# Patient Record
Sex: Female | Born: 1945 | Race: White | Hispanic: No
Health system: Southern US, Academic
[De-identification: ages and names within clinical notes are randomized; demographics above are authoritative.]

## PROBLEM LIST (undated history)

## (undated) ENCOUNTER — Encounter

## (undated) ENCOUNTER — Encounter: Attending: Certified Registered" | Primary: Certified Registered"

## (undated) ENCOUNTER — Ambulatory Visit

## (undated) ENCOUNTER — Telehealth: Attending: Certified Registered" | Primary: Certified Registered"

## (undated) ENCOUNTER — Ambulatory Visit: Payer: MEDICARE

## (undated) ENCOUNTER — Telehealth

## (undated) ENCOUNTER — Encounter: Attending: Gastroenterology | Primary: Gastroenterology

## (undated) ENCOUNTER — Ambulatory Visit
Attending: Student in an Organized Health Care Education/Training Program | Primary: Student in an Organized Health Care Education/Training Program

## (undated) DIAGNOSIS — K219 Gastro-esophageal reflux disease without esophagitis: Secondary | ICD-10-CM

## (undated) DIAGNOSIS — I10 Essential (primary) hypertension: Secondary | ICD-10-CM

## (undated) DIAGNOSIS — T4145XA Adverse effect of unspecified anesthetic, initial encounter: Secondary | ICD-10-CM

## (undated) DIAGNOSIS — N939 Abnormal uterine and vaginal bleeding, unspecified: Secondary | ICD-10-CM

## (undated) DIAGNOSIS — B1920 Unspecified viral hepatitis C without hepatic coma: Secondary | ICD-10-CM

## (undated) DIAGNOSIS — I639 Cerebral infarction, unspecified: Secondary | ICD-10-CM

## (undated) DIAGNOSIS — K4190 Unilateral femoral hernia, without obstruction or gangrene, not specified as recurrent: Secondary | ICD-10-CM

## (undated) DIAGNOSIS — H35389 Toxic maculopathy, unspecified eye: Secondary | ICD-10-CM

## (undated) DIAGNOSIS — N200 Calculus of kidney: Secondary | ICD-10-CM

## (undated) DIAGNOSIS — I6381 Other cerebral infarction due to occlusion or stenosis of small artery: Secondary | ICD-10-CM

## (undated) DIAGNOSIS — G459 Transient cerebral ischemic attack, unspecified: Secondary | ICD-10-CM

## (undated) DIAGNOSIS — C801 Malignant (primary) neoplasm, unspecified: Secondary | ICD-10-CM

## (undated) DIAGNOSIS — Z9289 Personal history of other medical treatment: Secondary | ICD-10-CM

## (undated) DIAGNOSIS — F338 Other recurrent depressive disorders: Secondary | ICD-10-CM

## (undated) DIAGNOSIS — F411 Generalized anxiety disorder: Secondary | ICD-10-CM

## (undated) DIAGNOSIS — S2239XA Fracture of one rib, unspecified side, initial encounter for closed fracture: Secondary | ICD-10-CM

## (undated) DIAGNOSIS — Z87442 Personal history of urinary calculi: Secondary | ICD-10-CM

## (undated) DIAGNOSIS — S82899A Other fracture of unspecified lower leg, initial encounter for closed fracture: Secondary | ICD-10-CM

## (undated) DIAGNOSIS — S3992XA Unspecified injury of lower back, initial encounter: Secondary | ICD-10-CM

## (undated) DIAGNOSIS — F32A Depression, unspecified: Secondary | ICD-10-CM

## (undated) DIAGNOSIS — R109 Unspecified abdominal pain: Secondary | ICD-10-CM

## (undated) DIAGNOSIS — H348192 Central retinal vein occlusion, unspecified eye, stable: Secondary | ICD-10-CM

## (undated) DIAGNOSIS — H269 Unspecified cataract: Secondary | ICD-10-CM

## (undated) DIAGNOSIS — Z8619 Personal history of other infectious and parasitic diseases: Secondary | ICD-10-CM

## (undated) DIAGNOSIS — F329 Major depressive disorder, single episode, unspecified: Secondary | ICD-10-CM

## (undated) DIAGNOSIS — I728 Aneurysm of other specified arteries: Secondary | ICD-10-CM

## (undated) DIAGNOSIS — J45909 Unspecified asthma, uncomplicated: Secondary | ICD-10-CM

## (undated) DIAGNOSIS — T8859XA Other complications of anesthesia, initial encounter: Secondary | ICD-10-CM

## (undated) HISTORY — DX: Unspecified cataract: H26.9

## (undated) HISTORY — DX: Other fracture of unspecified lower leg, initial encounter for closed fracture: S82.899A

## (undated) HISTORY — PX: TONSILLECTOMY: SUR1361

## (undated) HISTORY — PX: LIVER TRANSPLANTATION: SUR1389

## (undated) HISTORY — DX: Calculus of kidney: N20.0

## (undated) HISTORY — DX: Personal history of other infectious and parasitic diseases: Z86.19

## (undated) HISTORY — DX: Transient cerebral ischemic attack, unspecified: G45.9

## (undated) HISTORY — DX: Aneurysm of other specified arteries: I72.8

## (undated) HISTORY — DX: Unspecified abdominal pain: R10.9

## (undated) HISTORY — DX: Unspecified asthma, uncomplicated: J45.909

## (undated) HISTORY — DX: Gastro-esophageal reflux disease without esophagitis: K21.9

## (undated) HISTORY — DX: Generalized anxiety disorder: F41.1

## (undated) HISTORY — DX: Unspecified viral hepatitis C without hepatic coma: B19.20

## (undated) HISTORY — DX: Central retinal vein occlusion, unspecified eye, stable: H34.8192

## (undated) HISTORY — PX: TUBAL LIGATION: SHX77

## (undated) HISTORY — DX: Fracture of one rib, unspecified side, initial encounter for closed fracture: S22.39XA

## (undated) HISTORY — DX: Toxic maculopathy, unspecified eye: H35.389

## (undated) HISTORY — DX: Unspecified injury of lower back, initial encounter: S39.92XA

## (undated) HISTORY — PX: CATARACT EXTRACTION: SUR2

## (undated) HISTORY — DX: Unilateral femoral hernia, without obstruction or gangrene, not specified as recurrent: K41.90

## (undated) HISTORY — DX: Malignant (primary) neoplasm, unspecified: C80.1

## (undated) HISTORY — DX: Abnormal uterine and vaginal bleeding, unspecified: N93.9

## (undated) HISTORY — DX: Other cerebral infarction due to occlusion or stenosis of small artery: I63.81

## (undated) HISTORY — DX: Personal history of urinary calculi: Z87.442

---
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
## 1969-06-15 DIAGNOSIS — Z9289 Personal history of other medical treatment: Secondary | ICD-10-CM

## 1969-06-15 HISTORY — DX: Personal history of other medical treatment: Z92.89

## 1995-06-16 HISTORY — PX: CHOLECYSTECTOMY: SHX55

## 1995-06-16 HISTORY — PX: FEMORAL HERNIA REPAIR: SUR1179

## 1995-06-16 HISTORY — PX: NASAL SINUS SURGERY: SHX719

## 2001-10-11 ENCOUNTER — Ambulatory Visit (HOSPITAL_COMMUNITY): Admission: RE | Admit: 2001-10-11 | Discharge: 2001-10-11 | Payer: Self-pay | Admitting: *Deleted

## 2001-10-11 ENCOUNTER — Encounter: Payer: Self-pay | Admitting: *Deleted

## 2003-07-10 ENCOUNTER — Encounter: Admission: RE | Admit: 2003-07-10 | Discharge: 2003-07-10 | Payer: Self-pay | Admitting: Internal Medicine

## 2004-03-10 ENCOUNTER — Ambulatory Visit (HOSPITAL_COMMUNITY): Admission: RE | Admit: 2004-03-10 | Discharge: 2004-03-10 | Payer: Self-pay | Admitting: *Deleted

## 2004-03-14 ENCOUNTER — Emergency Department (HOSPITAL_COMMUNITY): Admission: EM | Admit: 2004-03-14 | Discharge: 2004-03-14 | Payer: Self-pay | Admitting: Emergency Medicine

## 2004-08-16 ENCOUNTER — Emergency Department (HOSPITAL_COMMUNITY): Admission: EM | Admit: 2004-08-16 | Discharge: 2004-08-16 | Payer: Self-pay | Admitting: Family Medicine

## 2005-07-09 ENCOUNTER — Ambulatory Visit (HOSPITAL_COMMUNITY): Admission: RE | Admit: 2005-07-09 | Discharge: 2005-07-09 | Payer: Self-pay | Admitting: Gastroenterology

## 2005-07-28 ENCOUNTER — Other Ambulatory Visit: Admission: RE | Admit: 2005-07-28 | Discharge: 2005-07-28 | Payer: Self-pay | Admitting: Internal Medicine

## 2005-08-05 ENCOUNTER — Encounter: Admission: RE | Admit: 2005-08-05 | Discharge: 2005-08-05 | Payer: Self-pay | Admitting: Internal Medicine

## 2005-08-21 ENCOUNTER — Ambulatory Visit (HOSPITAL_COMMUNITY): Admission: RE | Admit: 2005-08-21 | Discharge: 2005-08-21 | Payer: Self-pay | Admitting: Internal Medicine

## 2006-01-15 ENCOUNTER — Ambulatory Visit: Payer: Self-pay | Admitting: Gastroenterology

## 2006-03-03 ENCOUNTER — Ambulatory Visit (HOSPITAL_COMMUNITY): Admission: RE | Admit: 2006-03-03 | Discharge: 2006-03-03 | Payer: Self-pay | Admitting: Gastroenterology

## 2006-03-12 ENCOUNTER — Ambulatory Visit: Payer: Self-pay | Admitting: Gastroenterology

## 2006-08-17 ENCOUNTER — Ambulatory Visit (HOSPITAL_COMMUNITY): Admission: RE | Admit: 2006-08-17 | Discharge: 2006-08-17 | Payer: Self-pay | Admitting: Gastroenterology

## 2006-10-12 ENCOUNTER — Ambulatory Visit (HOSPITAL_COMMUNITY): Admission: RE | Admit: 2006-10-12 | Discharge: 2006-10-12 | Payer: Self-pay | Admitting: Gastroenterology

## 2006-10-12 ENCOUNTER — Encounter (INDEPENDENT_AMBULATORY_CARE_PROVIDER_SITE_OTHER): Payer: Self-pay | Admitting: Specialist

## 2006-10-22 ENCOUNTER — Ambulatory Visit: Payer: Self-pay | Admitting: Hematology & Oncology

## 2006-11-03 LAB — COMPREHENSIVE METABOLIC PANEL
Albumin: 4 g/dL (ref 3.5–5.2)
Alkaline Phosphatase: 84 U/L (ref 39–117)
CO2: 25 mEq/L (ref 19–32)
Chloride: 108 mEq/L (ref 96–112)
Glucose, Bld: 90 mg/dL (ref 70–99)
Potassium: 4 mEq/L (ref 3.5–5.3)
Sodium: 141 mEq/L (ref 135–145)
Total Bilirubin: 1.3 mg/dL — ABNORMAL HIGH (ref 0.3–1.2)
Total Protein: 7 g/dL (ref 6.0–8.3)

## 2006-11-03 LAB — CBC WITH DIFFERENTIAL/PLATELET
BASO%: 0.7 % (ref 0.0–2.0)
HCT: 37.5 % (ref 34.8–46.6)
MONO#: 0.4 10*3/uL (ref 0.1–0.9)
RDW: 14.2 % (ref 11.3–14.5)
WBC: 4.8 10*3/uL (ref 3.9–10.0)

## 2006-11-03 LAB — AFP TUMOR MARKER: AFP-Tumor Marker: 22.6 ng/mL — ABNORMAL HIGH (ref 0.0–8.0)

## 2006-11-19 ENCOUNTER — Ambulatory Visit (HOSPITAL_COMMUNITY): Admission: RE | Admit: 2006-11-19 | Discharge: 2006-11-19 | Payer: Self-pay | Admitting: Gastroenterology

## 2006-12-03 ENCOUNTER — Encounter (INDEPENDENT_AMBULATORY_CARE_PROVIDER_SITE_OTHER): Payer: Self-pay | Admitting: Interventional Radiology

## 2006-12-03 ENCOUNTER — Ambulatory Visit (HOSPITAL_COMMUNITY): Admission: RE | Admit: 2006-12-03 | Discharge: 2006-12-03 | Payer: Self-pay | Admitting: Hematology & Oncology

## 2006-12-20 ENCOUNTER — Ambulatory Visit (HOSPITAL_COMMUNITY): Admission: RE | Admit: 2006-12-20 | Discharge: 2006-12-20 | Payer: Self-pay | Admitting: Oncology

## 2007-11-01 ENCOUNTER — Other Ambulatory Visit: Admission: RE | Admit: 2007-11-01 | Discharge: 2007-11-01 | Payer: Self-pay | Admitting: Family Medicine

## 2008-02-17 HISTORY — PX: OTHER SURGICAL HISTORY: SHX169

## 2010-02-17 ENCOUNTER — Emergency Department (HOSPITAL_COMMUNITY): Admission: EM | Admit: 2010-02-17 | Discharge: 2010-02-17 | Payer: Self-pay | Admitting: Emergency Medicine

## 2010-07-16 ENCOUNTER — Other Ambulatory Visit: Payer: Self-pay | Admitting: Dermatology

## 2010-08-28 LAB — URINALYSIS, MICROSCOPIC ONLY
Bilirubin Urine: NEGATIVE
Glucose, UA: NEGATIVE mg/dL
Protein, ur: NEGATIVE mg/dL
pH: 5 (ref 5.0–8.0)

## 2010-08-28 LAB — POCT URINALYSIS DIPSTICK
Bilirubin Urine: NEGATIVE
Glucose, UA: NEGATIVE mg/dL
Ketones, ur: NEGATIVE mg/dL
Nitrite: NEGATIVE
Protein, ur: NEGATIVE mg/dL
Specific Gravity, Urine: 1.025 (ref 1.005–1.030)
Urobilinogen, UA: 0.2 mg/dL (ref 0.0–1.0)

## 2010-08-28 LAB — URINE CULTURE: Culture: NO GROWTH

## 2011-02-09 ENCOUNTER — Other Ambulatory Visit: Payer: Self-pay | Admitting: Dermatology

## 2011-03-26 DIAGNOSIS — H26491 Other secondary cataract, right eye: Secondary | ICD-10-CM

## 2011-03-26 DIAGNOSIS — H34812 Central retinal vein occlusion, left eye, with macular edema: Secondary | ICD-10-CM

## 2011-03-26 HISTORY — DX: Other secondary cataract, right eye: H26.491

## 2011-03-26 HISTORY — DX: Central retinal vein occlusion, left eye, with macular edema: H34.8120

## 2011-04-01 LAB — CBC
Hemoglobin: 13.1
MCHC: 34
RBC: 4.21

## 2011-04-01 LAB — PROTIME-INR: INR: 1.1

## 2011-06-18 DIAGNOSIS — H2510 Age-related nuclear cataract, unspecified eye: Secondary | ICD-10-CM | POA: Diagnosis not present

## 2011-06-18 DIAGNOSIS — H3581 Retinal edema: Secondary | ICD-10-CM | POA: Diagnosis not present

## 2011-06-18 DIAGNOSIS — H348192 Central retinal vein occlusion, unspecified eye, stable: Secondary | ICD-10-CM | POA: Diagnosis not present

## 2011-06-22 DIAGNOSIS — F329 Major depressive disorder, single episode, unspecified: Secondary | ICD-10-CM | POA: Diagnosis not present

## 2011-06-22 DIAGNOSIS — K7689 Other specified diseases of liver: Secondary | ICD-10-CM | POA: Diagnosis not present

## 2011-06-22 DIAGNOSIS — E559 Vitamin D deficiency, unspecified: Secondary | ICD-10-CM | POA: Diagnosis not present

## 2011-06-22 DIAGNOSIS — Z23 Encounter for immunization: Secondary | ICD-10-CM | POA: Diagnosis not present

## 2011-09-03 DIAGNOSIS — H2510 Age-related nuclear cataract, unspecified eye: Secondary | ICD-10-CM | POA: Diagnosis not present

## 2011-09-03 DIAGNOSIS — H3581 Retinal edema: Secondary | ICD-10-CM | POA: Diagnosis not present

## 2011-09-03 DIAGNOSIS — H348192 Central retinal vein occlusion, unspecified eye, stable: Secondary | ICD-10-CM | POA: Diagnosis not present

## 2011-09-24 DIAGNOSIS — Z79899 Other long term (current) drug therapy: Secondary | ICD-10-CM | POA: Diagnosis not present

## 2011-09-24 DIAGNOSIS — Z944 Liver transplant status: Secondary | ICD-10-CM | POA: Diagnosis not present

## 2011-11-05 DIAGNOSIS — H2510 Age-related nuclear cataract, unspecified eye: Secondary | ICD-10-CM | POA: Diagnosis not present

## 2011-11-05 DIAGNOSIS — H3581 Retinal edema: Secondary | ICD-10-CM | POA: Diagnosis not present

## 2011-11-05 DIAGNOSIS — H348192 Central retinal vein occlusion, unspecified eye, stable: Secondary | ICD-10-CM | POA: Diagnosis not present

## 2011-12-14 DIAGNOSIS — J069 Acute upper respiratory infection, unspecified: Secondary | ICD-10-CM | POA: Diagnosis not present

## 2012-01-13 DIAGNOSIS — Z1211 Encounter for screening for malignant neoplasm of colon: Secondary | ICD-10-CM | POA: Diagnosis not present

## 2012-01-13 DIAGNOSIS — F329 Major depressive disorder, single episode, unspecified: Secondary | ICD-10-CM | POA: Diagnosis not present

## 2012-01-14 DIAGNOSIS — H2510 Age-related nuclear cataract, unspecified eye: Secondary | ICD-10-CM | POA: Diagnosis not present

## 2012-01-14 DIAGNOSIS — H348192 Central retinal vein occlusion, unspecified eye, stable: Secondary | ICD-10-CM | POA: Diagnosis not present

## 2012-01-14 DIAGNOSIS — H3581 Retinal edema: Secondary | ICD-10-CM | POA: Diagnosis not present

## 2012-01-26 DIAGNOSIS — N200 Calculus of kidney: Secondary | ICD-10-CM | POA: Diagnosis not present

## 2012-03-04 DIAGNOSIS — H2510 Age-related nuclear cataract, unspecified eye: Secondary | ICD-10-CM | POA: Diagnosis not present

## 2012-03-10 DIAGNOSIS — F329 Major depressive disorder, single episode, unspecified: Secondary | ICD-10-CM | POA: Diagnosis not present

## 2012-03-10 DIAGNOSIS — C228 Malignant neoplasm of liver, primary, unspecified as to type: Secondary | ICD-10-CM | POA: Diagnosis not present

## 2012-03-10 DIAGNOSIS — K7460 Unspecified cirrhosis of liver: Secondary | ICD-10-CM | POA: Diagnosis not present

## 2012-03-10 DIAGNOSIS — Z0389 Encounter for observation for other suspected diseases and conditions ruled out: Secondary | ICD-10-CM | POA: Diagnosis not present

## 2012-03-10 DIAGNOSIS — R945 Abnormal results of liver function studies: Secondary | ICD-10-CM | POA: Diagnosis not present

## 2012-03-10 DIAGNOSIS — Z944 Liver transplant status: Secondary | ICD-10-CM | POA: Diagnosis not present

## 2012-03-10 DIAGNOSIS — B182 Chronic viral hepatitis C: Secondary | ICD-10-CM | POA: Diagnosis not present

## 2012-03-10 DIAGNOSIS — Q441 Other congenital malformations of gallbladder: Secondary | ICD-10-CM | POA: Diagnosis not present

## 2012-03-10 DIAGNOSIS — B1920 Unspecified viral hepatitis C without hepatic coma: Secondary | ICD-10-CM | POA: Diagnosis not present

## 2012-03-10 DIAGNOSIS — I10 Essential (primary) hypertension: Secondary | ICD-10-CM | POA: Diagnosis not present

## 2012-03-10 DIAGNOSIS — Z79899 Other long term (current) drug therapy: Secondary | ICD-10-CM | POA: Diagnosis not present

## 2012-03-31 DIAGNOSIS — H3581 Retinal edema: Secondary | ICD-10-CM | POA: Diagnosis not present

## 2012-03-31 DIAGNOSIS — H348192 Central retinal vein occlusion, unspecified eye, stable: Secondary | ICD-10-CM | POA: Diagnosis not present

## 2012-03-31 DIAGNOSIS — H2510 Age-related nuclear cataract, unspecified eye: Secondary | ICD-10-CM | POA: Diagnosis not present

## 2012-05-04 DIAGNOSIS — Z23 Encounter for immunization: Secondary | ICD-10-CM | POA: Diagnosis not present

## 2012-05-04 DIAGNOSIS — F329 Major depressive disorder, single episode, unspecified: Secondary | ICD-10-CM | POA: Diagnosis not present

## 2012-05-16 DIAGNOSIS — H2510 Age-related nuclear cataract, unspecified eye: Secondary | ICD-10-CM | POA: Diagnosis not present

## 2012-05-16 DIAGNOSIS — H269 Unspecified cataract: Secondary | ICD-10-CM | POA: Diagnosis not present

## 2012-05-17 DIAGNOSIS — H2510 Age-related nuclear cataract, unspecified eye: Secondary | ICD-10-CM | POA: Diagnosis not present

## 2012-05-24 DIAGNOSIS — H2510 Age-related nuclear cataract, unspecified eye: Secondary | ICD-10-CM | POA: Diagnosis not present

## 2012-06-27 DIAGNOSIS — H269 Unspecified cataract: Secondary | ICD-10-CM | POA: Diagnosis not present

## 2012-06-27 DIAGNOSIS — H2510 Age-related nuclear cataract, unspecified eye: Secondary | ICD-10-CM | POA: Diagnosis not present

## 2012-07-20 DIAGNOSIS — R42 Dizziness and giddiness: Secondary | ICD-10-CM | POA: Diagnosis not present

## 2012-08-18 DIAGNOSIS — Z961 Presence of intraocular lens: Secondary | ICD-10-CM | POA: Diagnosis not present

## 2012-08-18 DIAGNOSIS — B182 Chronic viral hepatitis C: Secondary | ICD-10-CM | POA: Diagnosis not present

## 2012-08-18 DIAGNOSIS — H348192 Central retinal vein occlusion, unspecified eye, stable: Secondary | ICD-10-CM | POA: Diagnosis not present

## 2012-08-18 DIAGNOSIS — Z944 Liver transplant status: Secondary | ICD-10-CM | POA: Diagnosis not present

## 2012-08-18 DIAGNOSIS — Z79899 Other long term (current) drug therapy: Secondary | ICD-10-CM | POA: Diagnosis not present

## 2012-08-18 DIAGNOSIS — Z01818 Encounter for other preprocedural examination: Secondary | ICD-10-CM | POA: Diagnosis not present

## 2012-08-18 DIAGNOSIS — H3581 Retinal edema: Secondary | ICD-10-CM | POA: Diagnosis not present

## 2012-08-24 DIAGNOSIS — I10 Essential (primary) hypertension: Secondary | ICD-10-CM | POA: Diagnosis not present

## 2012-08-24 DIAGNOSIS — B1920 Unspecified viral hepatitis C without hepatic coma: Secondary | ICD-10-CM | POA: Diagnosis not present

## 2012-08-24 DIAGNOSIS — F329 Major depressive disorder, single episode, unspecified: Secondary | ICD-10-CM | POA: Diagnosis not present

## 2012-08-24 DIAGNOSIS — T8640 Unspecified complication of liver transplant: Secondary | ICD-10-CM | POA: Diagnosis not present

## 2012-08-24 DIAGNOSIS — R748 Abnormal levels of other serum enzymes: Secondary | ICD-10-CM | POA: Diagnosis not present

## 2012-08-24 DIAGNOSIS — Z79899 Other long term (current) drug therapy: Secondary | ICD-10-CM | POA: Diagnosis not present

## 2012-08-26 DIAGNOSIS — F331 Major depressive disorder, recurrent, moderate: Secondary | ICD-10-CM | POA: Diagnosis not present

## 2012-09-05 DIAGNOSIS — F331 Major depressive disorder, recurrent, moderate: Secondary | ICD-10-CM | POA: Diagnosis not present

## 2012-09-06 DIAGNOSIS — H20029 Recurrent acute iridocyclitis, unspecified eye: Secondary | ICD-10-CM | POA: Diagnosis not present

## 2012-09-13 DIAGNOSIS — F331 Major depressive disorder, recurrent, moderate: Secondary | ICD-10-CM | POA: Diagnosis not present

## 2012-09-22 DIAGNOSIS — H348192 Central retinal vein occlusion, unspecified eye, stable: Secondary | ICD-10-CM | POA: Diagnosis not present

## 2012-10-04 DIAGNOSIS — F331 Major depressive disorder, recurrent, moderate: Secondary | ICD-10-CM | POA: Diagnosis not present

## 2012-10-20 DIAGNOSIS — Z23 Encounter for immunization: Secondary | ICD-10-CM | POA: Diagnosis not present

## 2012-10-20 DIAGNOSIS — F329 Major depressive disorder, single episode, unspecified: Secondary | ICD-10-CM | POA: Diagnosis not present

## 2012-10-26 DIAGNOSIS — F331 Major depressive disorder, recurrent, moderate: Secondary | ICD-10-CM | POA: Diagnosis not present

## 2012-10-27 DIAGNOSIS — Z1231 Encounter for screening mammogram for malignant neoplasm of breast: Secondary | ICD-10-CM | POA: Diagnosis not present

## 2012-11-02 DIAGNOSIS — Z944 Liver transplant status: Secondary | ICD-10-CM | POA: Diagnosis not present

## 2012-11-02 DIAGNOSIS — Z79899 Other long term (current) drug therapy: Secondary | ICD-10-CM | POA: Diagnosis not present

## 2012-11-03 DIAGNOSIS — H348192 Central retinal vein occlusion, unspecified eye, stable: Secondary | ICD-10-CM | POA: Diagnosis not present

## 2012-11-03 DIAGNOSIS — H3581 Retinal edema: Secondary | ICD-10-CM | POA: Diagnosis not present

## 2012-11-03 DIAGNOSIS — Z961 Presence of intraocular lens: Secondary | ICD-10-CM | POA: Diagnosis not present

## 2012-11-11 ENCOUNTER — Encounter: Payer: Self-pay | Admitting: Obstetrics & Gynecology

## 2012-11-11 ENCOUNTER — Ambulatory Visit (INDEPENDENT_AMBULATORY_CARE_PROVIDER_SITE_OTHER): Payer: Medicare Other | Admitting: Obstetrics & Gynecology

## 2012-11-11 VITALS — BP 116/64 | Ht 62.5 in | Wt 162.2 lb

## 2012-11-11 DIAGNOSIS — Z124 Encounter for screening for malignant neoplasm of cervix: Secondary | ICD-10-CM

## 2012-11-11 DIAGNOSIS — F329 Major depressive disorder, single episode, unspecified: Secondary | ICD-10-CM

## 2012-11-11 DIAGNOSIS — Z944 Liver transplant status: Secondary | ICD-10-CM

## 2012-11-11 DIAGNOSIS — N881 Old laceration of cervix uteri: Secondary | ICD-10-CM

## 2012-11-11 DIAGNOSIS — C229 Malignant neoplasm of liver, not specified as primary or secondary: Secondary | ICD-10-CM

## 2012-11-11 DIAGNOSIS — K739 Chronic hepatitis, unspecified: Secondary | ICD-10-CM

## 2012-11-11 DIAGNOSIS — K7689 Other specified diseases of liver: Secondary | ICD-10-CM

## 2012-11-11 DIAGNOSIS — H34819 Central retinal vein occlusion, unspecified eye: Secondary | ICD-10-CM

## 2012-11-11 DIAGNOSIS — R195 Other fecal abnormalities: Secondary | ICD-10-CM

## 2012-11-11 DIAGNOSIS — K5900 Constipation, unspecified: Secondary | ICD-10-CM

## 2012-11-11 DIAGNOSIS — H348192 Central retinal vein occlusion, unspecified eye, stable: Secondary | ICD-10-CM

## 2012-11-11 DIAGNOSIS — J309 Allergic rhinitis, unspecified: Secondary | ICD-10-CM

## 2012-11-11 DIAGNOSIS — N950 Postmenopausal bleeding: Secondary | ICD-10-CM

## 2012-11-11 NOTE — Patient Instructions (Addendum)
You will return for a pelvic ultrasound to evaluate the lining of the uterus.

## 2012-11-13 NOTE — Progress Notes (Signed)
67 y.o. [name] DivorcedCaucasianF here for new patient appointment.  She is referred by Dr. Shaune Hill for vaginal bleeding the patient desribes as very heavy with sudden onset on 10/20/12.  The bleeding lasted for two days and then stopped as quickly as it started.   She needed to wear a pad for the bleeding as it was heavy enough for this.  She has no preceeding symptoms--no breast tenderness or cramping.  She denies pelvic pain now.  She had no clots.  She is fairly certain it was vaginal.  She denies vaginal discharge.  She was not doing anything in particular when the bleeding started--no heavy lifting, no intercourse.  She has had a liver transplant due to Hepatitis C.  She reports having bleeding issues before the transplant was performed but she has no issues now with clotting.  She does not bleed excessively and is on no blood thinners.  No recent fevers.  No back or pelvic pain.  No recent urinary symptoms.  She reports constipation for several days before bleeding.  She did have a hard bowel movement the same day the bleeding started.  A concern of hers, unrelated to the bleeding, is skin itching which results in areas of excoriation that are then slow to heal.  She has patches of this on her arms, legs, and torso.  I feel this is most likely to the medications she is on and the delayed healing is probably due to her immunosuppressants.  Review of chart shows a skin biopsy in 2012 with results of lichen simplex chronicus.  Reports she hasn't seen a gynecologist in years and is unsure of her last Pap smear but she feels it has been years.  She has had a recent MMG but no recent breast exam.    No LMP recorded. Patient is postmenopausal.           Health Maintenance: Pap:  Pt unsure History of abnormal Pap:  no MMG:  Pt reports 2013 and neg   reports that she has never smoked. She has never used smokeless tobacco. She reports that she does not drink alcohol or use illicit drugs.  Past Medical  History  Diagnosis Date  . History of renal stone   . Hepatitis C   . Hernia, femoral     left  . Acid reflux   . Hiatal hernia   . 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 Broken rib     x2  . Broken ankle     right  . Abnormal uterine bleeding   . Blood transfusion without reported diagnosis     with first pregnancy  . History of shingles   . Asthma   . Cancer     liver (in the one that was removed)  . Tailbone injury     broken x2    Past Surgical History  Procedure Laterality Date  . Cholecystectomy    . Tranplant  02/17/08    liver  . Cataract extraction      bilateral  . Tubal ligation    . Nasal sinus surgery      x3  . Tonsillectomy  age 64  . Femoral hernia repair      left leg    Current Outpatient Prescriptions  Medication Sig Dispense Refill  . ALPHAGAN P 0.1 % SOLN Two drops daily      . folic acid (FOLVITE) 400 MCG  tablet Take 400 mcg by mouth daily.      . metoprolol tartrate (LOPRESSOR) 25 MG tablet 25 mg daily.      . NON FORMULARY avastin injection for retina damage      . potassium citrate (UROCIT-K) 10 MEQ (1080 MG) SR tablet       . pyridOXINE (VITAMIN B-6) 100 MG tablet Take 100 mg by mouth daily.      . tacrolimus (PROGRAF) 1 MG capsule Take 1 mg by mouth 2 (two) times daily.      Marland Kitchen venlafaxine XR (EFFEXOR-XR) 150 MG 24 hr capsule Take 150 mg by mouth. Take 150mg  twice daily, along with 75mg  twice daily      . VITAMIN D, CHOLECALCIFEROL, PO Take 25 mcg by mouth daily.       No current facility-administered medications for this visit.    Family History  Problem Relation Age of Onset  . Adopted: Yes  . Stroke Mother   . Alcoholism Father   . Alzheimer's disease Mother     ROS:  Pertinent items are noted in HPI.  Otherwise, a comprehensive ROS was negative.  Exam:   There were no vitals taken for this visit.       General appearance: alert, cooperative and appears stated  age Head: Normocephalic, without obvious abnormality, atraumatic Neck: no adenopathy, supple, symmetrical, trachea midline and thyroid normal to inspection and palpation Breasts: normal appearance, no masses or tenderness Abdomen: soft, non-tender; bowel sounds normal; no masses,  no organomegaly, large and well healed scar upper abdomen Extremities: extremities normal, atraumatic, no cyanosis or edema Skin: Skin color, texture, turgor normal. No rashes or lesions Lymph nodes: Cervical, supraclavicular, and axillary nodes normal. No abnormal inguinal nodes palpated Neurologic: Grossly normal   Pelvic: External genitalia:  no lesions              Urethra:  normal appearing urethra with no masses, tenderness or lesions              Bartholins and Skenes: normal                 Vagina: normal appearing vagina with normal color and discharge, no lesions, NO evidence of blood--new or old              Cervix: no lesions and stenotic              Pap taken: yes Bimanual Exam:  Uterus:  normal size, contour, position, consistency, mobility, non-tender              Adnexa: normal adnexa and no mass, fullness, tenderness               Rectovaginal: Confirms               Anus:  normal sphincter tone, internal hemorrhoids, heme +  Endometrial biopsy recommended.  Discussed with patient.  Verbal and written consent obtained.   Procedure:  Speculum placed.  Cervix visualized and cleansed with betadine prep.  A single toothed tenaculum was applied to the anterior lip of the cervix.  Very stenotic os noted.  Attempt make to dilate cervix with milex dilator.  Multiple attempts made.  Procedure aborted due to pt discomfort and lack of ability to dilate the cervix.     A:  Patient reported postmenopausal bleeding but no evidence on physical exam to suggest recent bleeding Heme + stool H/o liver transplant on immunosuppression  P:   Lengthy discussion with patient  and daughter regarding physical exam  findings.  I really feel she had rectal bleeding and it probably going to need a repeat colonoscopy.  I am going to perform a pelvic ultrasound to evaluate the endometrium and ensure I do not need to proceed with endometrial biopsy.  If the endometrium on ultrasound is <65mm, no biopsy will be recommended.  If >17mm, will use cytotec to help with the stenosis and a repeat in-office attempt at endometrial biopsy will be performed.  Other than hemmorrhoids, gyn exam was completely normal.  Patient was reassured by this.  Pap pending.  ~70 minutes spent with patient >50% of time was in face to face discussion of above.

## 2012-11-14 ENCOUNTER — Telehealth: Payer: Self-pay | Admitting: Obstetrics & Gynecology

## 2012-11-14 ENCOUNTER — Encounter: Payer: Self-pay | Admitting: Obstetrics & Gynecology

## 2012-11-14 DIAGNOSIS — J309 Allergic rhinitis, unspecified: Secondary | ICD-10-CM | POA: Insufficient documentation

## 2012-11-14 DIAGNOSIS — Z944 Liver transplant status: Secondary | ICD-10-CM | POA: Insufficient documentation

## 2012-11-14 DIAGNOSIS — H348192 Central retinal vein occlusion, unspecified eye, stable: Secondary | ICD-10-CM | POA: Insufficient documentation

## 2012-11-14 DIAGNOSIS — K739 Chronic hepatitis, unspecified: Secondary | ICD-10-CM | POA: Insufficient documentation

## 2012-11-14 DIAGNOSIS — K7689 Other specified diseases of liver: Secondary | ICD-10-CM | POA: Insufficient documentation

## 2012-11-14 DIAGNOSIS — F329 Major depressive disorder, single episode, unspecified: Secondary | ICD-10-CM | POA: Insufficient documentation

## 2012-11-14 DIAGNOSIS — F338 Other recurrent depressive disorders: Secondary | ICD-10-CM | POA: Insufficient documentation

## 2012-11-14 DIAGNOSIS — C229 Malignant neoplasm of liver, not specified as primary or secondary: Secondary | ICD-10-CM | POA: Insufficient documentation

## 2012-11-14 HISTORY — DX: Allergic rhinitis, unspecified: J30.9

## 2012-11-14 HISTORY — DX: Malignant neoplasm of liver, not specified as primary or secondary: C22.9

## 2012-11-14 HISTORY — DX: Liver transplant status: Z94.4

## 2012-11-14 HISTORY — DX: Major depressive disorder, single episode, unspecified: F32.9

## 2012-11-14 NOTE — Telephone Encounter (Signed)
Needs to give info to a nurse before appt 11/16/2012

## 2012-11-14 NOTE — Telephone Encounter (Signed)
Spoke with pt about OV last Friday with SM. Pt says she could not give a urine sample at the start of the visit, and was going to try at the end, but forgot. Pt has noticed lately that her urine is darker. Pt urinated into a clear cup at home over the weekend and states her urine was very brown, "like tea you can't see through, or fresh applejuice." Pt has hx of kidney stones, but is on a medication for it and has not had trouble except for this dark urine. Pt concerned and wondering if there is something she needs to do before [place] appt on Wed. Please advise.

## 2012-11-14 NOTE — Telephone Encounter (Signed)
No.  I will get a urine sample on Wednesday when she comes.

## 2012-11-15 ENCOUNTER — Telehealth: Payer: Self-pay

## 2012-11-15 DIAGNOSIS — N390 Urinary tract infection, site not specified: Secondary | ICD-10-CM | POA: Diagnosis not present

## 2012-11-15 DIAGNOSIS — N201 Calculus of ureter: Secondary | ICD-10-CM | POA: Diagnosis not present

## 2012-11-15 DIAGNOSIS — R310 Gross hematuria: Secondary | ICD-10-CM | POA: Diagnosis not present

## 2012-11-15 DIAGNOSIS — N281 Cyst of kidney, acquired: Secondary | ICD-10-CM | POA: Diagnosis not present

## 2012-11-15 NOTE — Telephone Encounter (Signed)
6/3 lmtcb/kn 

## 2012-11-15 NOTE — Telephone Encounter (Signed)
LM on pt's VM confirming name that Dr. Hyacinth Meeker would collect a urine sample tomorrow when she has her appt. Pt to call back with any other problems.

## 2012-11-15 NOTE — Telephone Encounter (Signed)
Message copied by Elisha Headland on Tue Nov 15, 2012  2:43 PM ------      Message from: Jerene Bears      Created: Tue Nov 15, 2012 12:59 PM       02.  Inform nl. ------

## 2012-11-16 ENCOUNTER — Ambulatory Visit (INDEPENDENT_AMBULATORY_CARE_PROVIDER_SITE_OTHER): Payer: Medicare Other | Admitting: Obstetrics & Gynecology

## 2012-11-16 ENCOUNTER — Ambulatory Visit (INDEPENDENT_AMBULATORY_CARE_PROVIDER_SITE_OTHER): Payer: Medicare Other

## 2012-11-16 ENCOUNTER — Other Ambulatory Visit: Payer: Self-pay | Admitting: *Deleted

## 2012-11-16 DIAGNOSIS — N950 Postmenopausal bleeding: Secondary | ICD-10-CM

## 2012-11-16 NOTE — Progress Notes (Signed)
67 y.o.Divorcedfemale here for a pelvic ultrasound.  She experienced an episode of what she believed was vaginal bleeding but on physical exam there was no blood in the vaginal vault.  She was heme+ on rectal exam.  She could not give me a urine sample that day.  She subsequently has seen urology and she had blood in her urine.  She is here for pelvic ultrasound as I could not obtain an endometrial biopsy day of visit due to stenotic cervical os.    No LMP recorded. Patient is postmenopausal.  FINDINGS: (report below) UTERUS: 4.6 x 2.5 x 2.4cm without fibroids EMS: 1.27mm, symmetric ADNEXA:   Left ovary atrophic measuring 2.3 x 0.8 x 1.1cm   Right ovary atrophic measuring 2.3 x 0.8 x 0.8cm CUL DE SAC: neg for free fluid  Reviewed results with patient.  Assessment:  Normal postmenopausal female gyn ultrasound, heme +stool, hematuria Plan: Patient will f/u with Dr. Loreta Ave for colonoscopy and urology for cystoscopy and probable CT.  ~15 minutes spent with patient >50% of time was in face to face discussion of above.

## 2012-11-17 ENCOUNTER — Encounter: Payer: Self-pay | Admitting: Obstetrics & Gynecology

## 2012-11-17 DIAGNOSIS — R310 Gross hematuria: Secondary | ICD-10-CM | POA: Diagnosis not present

## 2012-11-17 DIAGNOSIS — N200 Calculus of kidney: Secondary | ICD-10-CM | POA: Diagnosis not present

## 2012-11-17 NOTE — Patient Instructions (Signed)
Please call with any future issues.

## 2012-11-18 ENCOUNTER — Telehealth: Payer: Self-pay | Admitting: *Deleted

## 2012-11-18 NOTE — Telephone Encounter (Signed)
Patient calling for results of blood count from most recent. States she went to urology appt and they are asking for test results and patient is very insistent that we drew blood when she was here.  Advised that perhaps, CMA had blood drawn and held until MD could review but no tests are pending at this time. (Patients account of blood draw doesn't match the environment to our office or staff, no one with red short hair here.) Advised will check with MD but do not see any pending results.

## 2012-11-18 NOTE — Telephone Encounter (Signed)
Please call, pt has a question regarding her hgb result

## 2012-11-21 ENCOUNTER — Encounter (HOSPITAL_COMMUNITY): Payer: Self-pay | Admitting: Pharmacy Technician

## 2012-11-21 ENCOUNTER — Other Ambulatory Visit: Payer: Self-pay | Admitting: Urology

## 2012-11-21 NOTE — Telephone Encounter (Signed)
Since she has some memory issues, would it be best for me to leave it as I left it with her since indeed, there are no outstanding labs. I hate to cause her more confusion but am happy to call her if you prefer.

## 2012-11-21 NOTE — Telephone Encounter (Signed)
That is fine. Thanks 

## 2012-11-21 NOTE — Telephone Encounter (Signed)
Patient has hx of liver transplant and does not have the best memory.  No blood work was done while she was here either for new patient visit or ultrasound.  I did test her stool for blood, which was positive.  She could not give a urine sample when she was here the first time and she went to see her urologist between that visit and the ultrasound.  Also, I attempted an endometrial biopsy without success.  Endometrium was fine on ultrasound.  No additional tests needed.  I really feel like her bleeding is from the rectum, possibly urinary.

## 2012-11-22 ENCOUNTER — Encounter (HOSPITAL_COMMUNITY): Payer: Self-pay | Admitting: *Deleted

## 2012-11-22 NOTE — Progress Notes (Signed)
Reviewed history. Instructed to read every page in blue folder fill out history form for [facility] and pay close attention to restricted med. List. Hold all vitamins, herbs, supplements, asa or ibuprofen any med that is on list. Laxative as directed day prior and plenty of fluids and light dinner. Clear liquids then NPO past 5:30am. Secretary/administrator, insurance card photo ID to Short stay ;with a driver and must have someone stay with you post.

## 2012-11-22 NOTE — H&P (Signed)
History of Present Illness        Ms. Meinhart has PMH of Hep C from blood transfusion after the birth of her first born in [birth year].  In 1991 she was dx w/ Hep C ,  liver cancer in March 2009, and received liver tranplant in Sept 2009.  She is doing well medically post transplant.  She is a patient of Dr. Vernie Ammons with GU hx of nephrolithiasis.             -  A CT scan done in 9/11 revealed a 2.5 mm stone in the lower pole of her right kidney as well as a right renal cyst. On the left-hand side a 6.7 mm stone was identified in the lower pole with Hounsfield units of 650 consistent with uric acid.      Stone analysis: 90% uric acid and 10% tricalcium phosphate.      Stone risk analysis: Her serum studies showed slightly elevated uric acid while her 24-hour urine revealed her primary risk factor was a low 24-hour urine volume of 1.66 L with mild hypocitraturia and an elevated urinary pH.      Treatment: Potassium citrate 10 mEq 2 pills b.i.d.       No interventions needed during most recent f/u visit in 2013 since stones in both kidneys were stable on KUB.  She was scheduled for f/u in 1 year.  Interval history:  Ms. Koch presents today with complaints of one day of gross hematuria w/o clots associated with 9 out of 10 sharp suprapubic pain that woke her up from sleep in the morning.  She describes the feeling of pain as a heavy menstrual cramp.   Hematuria occurred only once 9 days ago and was noted with heavy bleeding thorough the day and slowly resolved in the evening.  She described the hematuria as bright red blood w/o clots.  Urinary incontinence and intermittent dysuria also associated with the hematuria.  Incontinence was very significant on the day of the hematuria that she had to changethe incontinence pad every 2 hours.  She felt that she emptied her bladder well during the episode of hemturia.  Denies urinary frequency/urgency/fever/chills/nause/vomitting.    She noted intermittent color of  urine was dark brown 2 months ago and this is new for her. This brown color is still present today in her urine along w/ some sedimentations noted.   Since she was uncertain as to source of hematuria, she was seen by the gynecologist last week and was told that pelvis exam was normal and bleeding is not from the gyne source but patient is scheduled to return tomorrow for pelvis ultrasound.   Several days prior to hematuria episode, patient had a rectal exam and was told that she has both internal and external hemorroids so she had also contributed blood in urine as coming from the hemorroids.   She denies hx of cigarette smoking but lived with a heavy smoker for 16 yrs.  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  This was the first time she has ever seen blood in urine and has not passed any stones.  She has been compliant with taking UroCit K daily.    Today she is asymptomatic except for feeling really tired.  Denies any other associated symptoms.  She has not tried to take any pain meds for the pain.  Denies any aggravating or alleviating factors. She has been regularly drinking 64 oz of water a day and compliant with taking Uroci  K prescribed for stone prevention.   Past Medical History Problems  1. History of  Asthma 493.90 2. History of  Chronic Hepatitis, C Virus 070.54 3. History of  Cirrhosis 571.5 4. History of  Depression 311 5. History of  Glaucoma 365.9 6. History of  Hepatocellular Carcinoma Of The Liver 155.0  Surgical History Problems  1. History of  Gallbladder Surgery 2. History of  Hernia Repair 3. History of  Liver Transplant 4. History of  Sinus Surgery  Current Meds 1. Avastin SOLN; Therapy: (Recorded:03Jun2014) to 2. Effexor XR 150 MG Oral Capsule Extended Release 24 Hour; Therapy: 16Jun2011 to 3. Ferrous Sulfate TABS; Therapy: (Recorded:13Aug2013) to 4. Fish Oil CAPS; Therapy: (Recorded:13Aug2013) to 5. Metoprolol Tartrate 25 MG Oral Tablet; Therapy: 30Nov2010  to 6. Omeprazole 20 MG Oral Tablet Delayed Release; Therapy: (Recorded:13Aug2013) to 7. Potassium Citrate ER 10 MEQ (1080 MG) Oral Tablet Extended Release; TAKE 1 TABLET BY  MOUTH EVERY MORNING AND TAKE 2 TABLETS BY MOUTH EVERYEVENING; Therapy:  14Oct2011 to (Evaluate:26Jul2014)  Requested for: 27Jan2014; Last Rx:27Jan2014 8. Prograf CAPS; Therapy: (Recorded:13Aug2013) to  Allergies Medication  1. Phenergan TABS 2. Augmentin TABS 3. Codeine Derivatives 4. Lidocaine HCl (PF) SOLN 5. Penicillins 6. Sulfa Drugs  Family History Problems  1. Family history of  No Significant Family History  Social History Problems  1. Caffeine Use one every 3-4 days 2. Marital History - Divorced V61.03 3. Never A Smoker 4. Occupation: Retired Nurse, children's  5. History of  Alcohol Use 6. History of  Tobacco Use  Review of Systems Genitourinary system(s) were reviewed and pertinent findings if present are noted.  Genitourinary: dysuria, nocturia, incontinence, hematuria and suprapubic pain, but no urinary frequency, no urinary urgency, urinary stream does not start and stop, no incomplete emptying of bladder, no urethral discharge and no perineal pain.  Gastrointestinal: abdominal pain, but no nausea, no vomiting and no flank pain.    Vitals Vital Signs [Data Includes: Last 1 Day]  03Jun2014 09:28AM  Blood Pressure: 125 / 77 Temperature: 97.8 F Heart Rate: 68  Physical Exam Constitutional: Well nourished and well developed. No acute distress.  ENT:. The ears and nose are normal in appearance.  Neck: The appearance of the neck is normal and no neck mass is present.  Pulmonary: No respiratory distress and normal respiratory rhythm and effort.  Cardiovascular: Heart rate and rhythm are normal. No peripheral edema.  Abdomen: The abdomen is soft and nontender. No masses are palpated. No CVA tenderness. No hernias are palpable. No hepatosplenomegaly noted.   . Genitourinary:   Ms. Gorsline on  examination was not toxic. She had no CVA tenderness. She had no abdominal tenderness.   Lymphatics: The femoral and inguinal nodes are not enlarged or tender.  Skin: Normal skin turgor, no visible rash and no visible skin lesions.  Neuro/Psych:. Mood and affect are appropriate.   Imp.: CT finding of 8x9x5 mm left UPJ stone w/ mild hydronephrosis.  Except for intermittent mild bladder cramping, patient is asymptomatic, does not even need pain med.  She will return for KUB for possible ESWL and urine cytology.  Tentatively scheduled for ESWL next thursday.

## 2012-11-24 ENCOUNTER — Encounter (HOSPITAL_COMMUNITY)
Admission: RE | Disposition: A | Discharge status: Home / Self Care | Payer: Self-pay | Source: Ambulatory Visit | Attending: Urology

## 2012-11-24 ENCOUNTER — Encounter (HOSPITAL_COMMUNITY): Payer: Self-pay | Admitting: General Practice

## 2012-11-24 ENCOUNTER — Ambulatory Visit (HOSPITAL_COMMUNITY)
Admission: RE | Admit: 2012-11-24 | Discharge: 2012-11-24 | Disposition: A | Discharge status: Home / Self Care | Payer: Medicare Other | Source: Ambulatory Visit | Attending: Urology | Admitting: Urology

## 2012-11-24 ENCOUNTER — Ambulatory Visit (HOSPITAL_COMMUNITY): Payer: Medicare Other

## 2012-11-24 DIAGNOSIS — J45909 Unspecified asthma, uncomplicated: Secondary | ICD-10-CM | POA: Diagnosis not present

## 2012-11-24 DIAGNOSIS — I10 Essential (primary) hypertension: Secondary | ICD-10-CM | POA: Insufficient documentation

## 2012-11-24 DIAGNOSIS — C228 Malignant neoplasm of liver, primary, unspecified as to type: Secondary | ICD-10-CM | POA: Insufficient documentation

## 2012-11-24 DIAGNOSIS — K7460 Unspecified cirrhosis of liver: Secondary | ICD-10-CM | POA: Diagnosis not present

## 2012-11-24 DIAGNOSIS — B182 Chronic viral hepatitis C: Secondary | ICD-10-CM | POA: Diagnosis not present

## 2012-11-24 DIAGNOSIS — N201 Calculus of ureter: Secondary | ICD-10-CM

## 2012-11-24 DIAGNOSIS — N200 Calculus of kidney: Secondary | ICD-10-CM | POA: Insufficient documentation

## 2012-11-24 DIAGNOSIS — Z79899 Other long term (current) drug therapy: Secondary | ICD-10-CM | POA: Insufficient documentation

## 2012-11-24 DIAGNOSIS — Z01818 Encounter for other preprocedural examination: Secondary | ICD-10-CM | POA: Diagnosis not present

## 2012-11-24 HISTORY — DX: Major depressive disorder, single episode, unspecified: F32.9

## 2012-11-24 HISTORY — DX: Calculus of kidney: N20.0

## 2012-11-24 HISTORY — DX: Essential (primary) hypertension: I10

## 2012-11-24 HISTORY — DX: Depression, unspecified: F32.A

## 2012-11-24 SURGERY — LITHOTRIPSY, ESWL
Anesthesia: LOCAL | Laterality: Left

## 2012-11-24 MED ORDER — CIPROFLOXACIN HCL 500 MG PO TABS
500.0000 mg | ORAL_TABLET | ORAL | Status: AC
  Administered 2012-11-24: 500 mg via ORAL
  Filled 2012-11-24: qty 1

## 2012-11-24 MED ORDER — SODIUM CHLORIDE 0.9 % IV SOLN
INTRAVENOUS | Status: DC
  Administered 2012-11-24: 14:00:00 via INTRAVENOUS

## 2012-11-24 MED ORDER — OXYCODONE-ACETAMINOPHEN 5-325 MG PO TABS: 2.0000 | ORAL_TABLET | Freq: Once | ORAL | Status: DC

## 2012-11-24 MED ORDER — TAMSULOSIN HCL 0.4 MG PO CAPS: 0.4000 mg | ORAL_CAPSULE | ORAL | Status: DC

## 2012-11-24 MED ORDER — DIAZEPAM 5 MG PO TABS
10.0000 mg | ORAL_TABLET | ORAL | Status: AC
  Administered 2012-11-24: 10 mg via ORAL
  Filled 2012-11-24: qty 2

## 2012-11-24 MED ORDER — OXYCODONE-ACETAMINOPHEN 10-325 MG PO TABS: 1.0000 | ORAL_TABLET | ORAL | Status: DC | PRN

## 2012-11-24 MED ORDER — DIPHENHYDRAMINE HCL 25 MG PO CAPS
25.0000 mg | ORAL_CAPSULE | ORAL | Status: AC
  Administered 2012-11-24: 25 mg via ORAL
  Filled 2012-11-24: qty 1

## 2012-11-24 NOTE — Op Note (Signed)
See [facility] OP note scanned into chart. 

## 2012-11-24 NOTE — Interval H&P Note (Signed)
History and Physical Interval Note:  11/24/2012 3:15 PM  Candace Hill  has presented today for surgery, with the diagnosis of left upj stone  The various methods of treatment have been discussed with the patient and family. After consideration of risks, benefits and other options for treatment, the patient has consented to  Procedure(s): LEFT EXTRACORPOREAL SHOCK WAVE LITHOTRIPSY (ESWL) (Left) as a surgical intervention .  The patient's history has been reviewed, patient examined, no change in status, stable for surgery.  I have reviewed the patient's chart and labs.  Questions were answered to the patient's satisfaction.     [place]

## 2012-11-28 NOTE — Telephone Encounter (Signed)
6/16 lmtcb//kn 

## 2012-11-30 DIAGNOSIS — N390 Urinary tract infection, site not specified: Secondary | ICD-10-CM | POA: Diagnosis not present

## 2012-11-30 DIAGNOSIS — N200 Calculus of kidney: Secondary | ICD-10-CM | POA: Diagnosis not present

## 2012-12-02 DIAGNOSIS — N200 Calculus of kidney: Secondary | ICD-10-CM | POA: Diagnosis not present

## 2012-12-02 DIAGNOSIS — R5381 Other malaise: Secondary | ICD-10-CM | POA: Diagnosis not present

## 2012-12-02 DIAGNOSIS — R5383 Other fatigue: Secondary | ICD-10-CM | POA: Diagnosis not present

## 2012-12-19 DIAGNOSIS — N200 Calculus of kidney: Secondary | ICD-10-CM | POA: Diagnosis not present

## 2012-12-19 DIAGNOSIS — R9389 Abnormal findings on diagnostic imaging of other specified body structures: Secondary | ICD-10-CM | POA: Diagnosis not present

## 2012-12-20 ENCOUNTER — Telehealth: Payer: Self-pay | Admitting: Obstetrics & Gynecology

## 2012-12-20 NOTE — Telephone Encounter (Signed)
Pt says she is returning call to kelly. No telephone call in system.

## 2012-12-21 NOTE — Telephone Encounter (Signed)
Patient notified of lab results of pap test done on 11/11/2012 normal/02 per Candace Hill 11/15/2012 calls to patient and left message to return our call. Patient states she wanted Dr. Hyacinth Meeker to know she has followed up with urologist Dr. Vernie Ammons and had surgery , Lithotripsy, done 11/24/2012. Patient stated she followed up yesterday with Dr. Vernie Ammons and was told due to configuration of one of the kidney stones he would do further testing in 3 weeks. Also patient is to call Dr. Loreta Ave office today and check on appt. For colonoscopy consult.

## 2012-12-21 NOTE — Telephone Encounter (Signed)
Patient notified of all results. 

## 2012-12-27 ENCOUNTER — Ambulatory Visit
Admission: RE | Admit: 2012-12-27 | Discharge: 2012-12-27 | Disposition: A | Discharge status: Home / Self Care | Payer: Medicare Other | Source: Ambulatory Visit | Attending: Family Medicine | Admitting: Family Medicine

## 2012-12-27 ENCOUNTER — Other Ambulatory Visit: Payer: Self-pay | Admitting: Family Medicine

## 2012-12-27 DIAGNOSIS — R51 Headache: Secondary | ICD-10-CM

## 2012-12-27 DIAGNOSIS — F05 Delirium due to known physiological condition: Secondary | ICD-10-CM | POA: Diagnosis not present

## 2012-12-27 DIAGNOSIS — R1110 Vomiting, unspecified: Secondary | ICD-10-CM | POA: Diagnosis not present

## 2012-12-27 DIAGNOSIS — H538 Other visual disturbances: Secondary | ICD-10-CM | POA: Diagnosis not present

## 2012-12-29 DIAGNOSIS — Z1211 Encounter for screening for malignant neoplasm of colon: Secondary | ICD-10-CM | POA: Diagnosis not present

## 2012-12-29 DIAGNOSIS — K219 Gastro-esophageal reflux disease without esophagitis: Secondary | ICD-10-CM | POA: Diagnosis not present

## 2012-12-29 DIAGNOSIS — D509 Iron deficiency anemia, unspecified: Secondary | ICD-10-CM | POA: Diagnosis not present

## 2012-12-29 DIAGNOSIS — B182 Chronic viral hepatitis C: Secondary | ICD-10-CM | POA: Diagnosis not present

## 2013-01-10 DIAGNOSIS — N200 Calculus of kidney: Secondary | ICD-10-CM | POA: Diagnosis not present

## 2013-01-10 DIAGNOSIS — R9389 Abnormal findings on diagnostic imaging of other specified body structures: Secondary | ICD-10-CM | POA: Diagnosis not present

## 2013-01-19 DIAGNOSIS — Z961 Presence of intraocular lens: Secondary | ICD-10-CM | POA: Diagnosis not present

## 2013-01-19 DIAGNOSIS — H348192 Central retinal vein occlusion, unspecified eye, stable: Secondary | ICD-10-CM | POA: Diagnosis not present

## 2013-01-19 DIAGNOSIS — H3581 Retinal edema: Secondary | ICD-10-CM | POA: Diagnosis not present

## 2013-01-25 DIAGNOSIS — D509 Iron deficiency anemia, unspecified: Secondary | ICD-10-CM | POA: Diagnosis not present

## 2013-01-25 DIAGNOSIS — K2990 Gastroduodenitis, unspecified, without bleeding: Secondary | ICD-10-CM | POA: Diagnosis not present

## 2013-01-25 DIAGNOSIS — K219 Gastro-esophageal reflux disease without esophagitis: Secondary | ICD-10-CM | POA: Diagnosis not present

## 2013-01-25 DIAGNOSIS — K2970 Gastritis, unspecified, without bleeding: Secondary | ICD-10-CM | POA: Diagnosis not present

## 2013-01-25 DIAGNOSIS — B182 Chronic viral hepatitis C: Secondary | ICD-10-CM | POA: Diagnosis not present

## 2013-01-25 DIAGNOSIS — Z1211 Encounter for screening for malignant neoplasm of colon: Secondary | ICD-10-CM | POA: Diagnosis not present

## 2013-01-30 DIAGNOSIS — R9389 Abnormal findings on diagnostic imaging of other specified body structures: Secondary | ICD-10-CM | POA: Diagnosis not present

## 2013-01-30 DIAGNOSIS — N200 Calculus of kidney: Secondary | ICD-10-CM | POA: Diagnosis not present

## 2013-02-06 DIAGNOSIS — N200 Calculus of kidney: Secondary | ICD-10-CM | POA: Diagnosis not present

## 2013-02-21 DIAGNOSIS — R82998 Other abnormal findings in urine: Secondary | ICD-10-CM | POA: Diagnosis not present

## 2013-02-23 DIAGNOSIS — R3129 Other microscopic hematuria: Secondary | ICD-10-CM | POA: Diagnosis not present

## 2013-03-16 DIAGNOSIS — B259 Cytomegaloviral disease, unspecified: Secondary | ICD-10-CM | POA: Diagnosis not present

## 2013-03-16 DIAGNOSIS — N281 Cyst of kidney, acquired: Secondary | ICD-10-CM | POA: Diagnosis not present

## 2013-03-16 DIAGNOSIS — M47817 Spondylosis without myelopathy or radiculopathy, lumbosacral region: Secondary | ICD-10-CM | POA: Diagnosis not present

## 2013-03-16 DIAGNOSIS — B182 Chronic viral hepatitis C: Secondary | ICD-10-CM | POA: Diagnosis not present

## 2013-03-16 DIAGNOSIS — Z87442 Personal history of urinary calculi: Secondary | ICD-10-CM | POA: Diagnosis not present

## 2013-03-16 DIAGNOSIS — H5440 Blindness, one eye, unspecified eye: Secondary | ICD-10-CM | POA: Diagnosis not present

## 2013-03-16 DIAGNOSIS — Z79899 Other long term (current) drug therapy: Secondary | ICD-10-CM | POA: Diagnosis not present

## 2013-03-16 DIAGNOSIS — C228 Malignant neoplasm of liver, primary, unspecified as to type: Secondary | ICD-10-CM | POA: Diagnosis not present

## 2013-03-16 DIAGNOSIS — Z48298 Encounter for aftercare following other organ transplant: Secondary | ICD-10-CM | POA: Diagnosis not present

## 2013-03-16 DIAGNOSIS — Z944 Liver transplant status: Secondary | ICD-10-CM | POA: Diagnosis not present

## 2013-03-16 DIAGNOSIS — Z9851 Tubal ligation status: Secondary | ICD-10-CM | POA: Diagnosis not present

## 2013-03-16 DIAGNOSIS — Z1159 Encounter for screening for other viral diseases: Secondary | ICD-10-CM | POA: Diagnosis not present

## 2013-03-16 DIAGNOSIS — I10 Essential (primary) hypertension: Secondary | ICD-10-CM | POA: Diagnosis not present

## 2013-03-16 DIAGNOSIS — Z880 Allergy status to penicillin: Secondary | ICD-10-CM | POA: Diagnosis not present

## 2013-03-16 DIAGNOSIS — Z885 Allergy status to narcotic agent status: Secondary | ICD-10-CM | POA: Diagnosis not present

## 2013-04-20 DIAGNOSIS — H348192 Central retinal vein occlusion, unspecified eye, stable: Secondary | ICD-10-CM | POA: Diagnosis not present

## 2013-04-20 DIAGNOSIS — H3581 Retinal edema: Secondary | ICD-10-CM | POA: Diagnosis not present

## 2013-04-20 DIAGNOSIS — Z961 Presence of intraocular lens: Secondary | ICD-10-CM | POA: Diagnosis not present

## 2013-04-21 DIAGNOSIS — N200 Calculus of kidney: Secondary | ICD-10-CM | POA: Diagnosis not present

## 2013-04-21 DIAGNOSIS — R82998 Other abnormal findings in urine: Secondary | ICD-10-CM | POA: Diagnosis not present

## 2013-04-24 DIAGNOSIS — J329 Chronic sinusitis, unspecified: Secondary | ICD-10-CM | POA: Diagnosis not present

## 2013-05-30 DIAGNOSIS — I10 Essential (primary) hypertension: Secondary | ICD-10-CM | POA: Diagnosis not present

## 2013-05-30 DIAGNOSIS — B182 Chronic viral hepatitis C: Secondary | ICD-10-CM | POA: Diagnosis not present

## 2013-05-30 DIAGNOSIS — Z8505 Personal history of malignant neoplasm of liver: Secondary | ICD-10-CM | POA: Diagnosis not present

## 2013-05-30 DIAGNOSIS — Z79899 Other long term (current) drug therapy: Secondary | ICD-10-CM | POA: Diagnosis not present

## 2013-05-30 DIAGNOSIS — Z882 Allergy status to sulfonamides status: Secondary | ICD-10-CM | POA: Diagnosis not present

## 2013-05-30 DIAGNOSIS — Z885 Allergy status to narcotic agent status: Secondary | ICD-10-CM | POA: Diagnosis not present

## 2013-05-30 DIAGNOSIS — Z48298 Encounter for aftercare following other organ transplant: Secondary | ICD-10-CM | POA: Diagnosis not present

## 2013-05-30 DIAGNOSIS — Z9851 Tubal ligation status: Secondary | ICD-10-CM | POA: Diagnosis not present

## 2013-05-30 DIAGNOSIS — Z944 Liver transplant status: Secondary | ICD-10-CM | POA: Diagnosis not present

## 2013-05-30 DIAGNOSIS — Z880 Allergy status to penicillin: Secondary | ICD-10-CM | POA: Diagnosis not present

## 2013-05-31 DIAGNOSIS — B182 Chronic viral hepatitis C: Secondary | ICD-10-CM | POA: Insufficient documentation

## 2013-05-31 HISTORY — DX: Chronic viral hepatitis C: B18.2

## 2013-06-22 DIAGNOSIS — R42 Dizziness and giddiness: Secondary | ICD-10-CM | POA: Diagnosis not present

## 2013-06-22 DIAGNOSIS — F3289 Other specified depressive episodes: Secondary | ICD-10-CM | POA: Diagnosis not present

## 2013-06-22 DIAGNOSIS — F329 Major depressive disorder, single episode, unspecified: Secondary | ICD-10-CM | POA: Diagnosis not present

## 2013-07-04 DIAGNOSIS — Z79899 Other long term (current) drug therapy: Secondary | ICD-10-CM | POA: Diagnosis not present

## 2013-07-04 DIAGNOSIS — B182 Chronic viral hepatitis C: Secondary | ICD-10-CM | POA: Diagnosis not present

## 2013-07-04 DIAGNOSIS — Z944 Liver transplant status: Secondary | ICD-10-CM | POA: Diagnosis not present

## 2013-07-12 DIAGNOSIS — Z944 Liver transplant status: Secondary | ICD-10-CM | POA: Diagnosis not present

## 2013-07-12 DIAGNOSIS — Z79899 Other long term (current) drug therapy: Secondary | ICD-10-CM | POA: Diagnosis not present

## 2013-07-19 DIAGNOSIS — Z944 Liver transplant status: Secondary | ICD-10-CM | POA: Diagnosis not present

## 2013-07-19 DIAGNOSIS — Z79899 Other long term (current) drug therapy: Secondary | ICD-10-CM | POA: Diagnosis not present

## 2013-07-28 DIAGNOSIS — B079 Viral wart, unspecified: Secondary | ICD-10-CM | POA: Diagnosis not present

## 2013-07-28 DIAGNOSIS — L738 Other specified follicular disorders: Secondary | ICD-10-CM | POA: Diagnosis not present

## 2013-07-28 DIAGNOSIS — L259 Unspecified contact dermatitis, unspecified cause: Secondary | ICD-10-CM | POA: Diagnosis not present

## 2013-08-07 DIAGNOSIS — F331 Major depressive disorder, recurrent, moderate: Secondary | ICD-10-CM | POA: Diagnosis not present

## 2013-08-09 DIAGNOSIS — Z944 Liver transplant status: Secondary | ICD-10-CM | POA: Diagnosis not present

## 2013-08-09 DIAGNOSIS — B182 Chronic viral hepatitis C: Secondary | ICD-10-CM | POA: Diagnosis not present

## 2013-08-09 DIAGNOSIS — Z79899 Other long term (current) drug therapy: Secondary | ICD-10-CM | POA: Diagnosis not present

## 2013-08-24 DIAGNOSIS — L259 Unspecified contact dermatitis, unspecified cause: Secondary | ICD-10-CM | POA: Diagnosis not present

## 2013-08-29 DIAGNOSIS — B182 Chronic viral hepatitis C: Secondary | ICD-10-CM | POA: Diagnosis not present

## 2013-08-29 DIAGNOSIS — Z944 Liver transplant status: Secondary | ICD-10-CM | POA: Diagnosis not present

## 2013-08-29 DIAGNOSIS — Z79899 Other long term (current) drug therapy: Secondary | ICD-10-CM | POA: Diagnosis not present

## 2013-09-29 DIAGNOSIS — Z79899 Other long term (current) drug therapy: Secondary | ICD-10-CM | POA: Diagnosis not present

## 2013-09-29 DIAGNOSIS — B182 Chronic viral hepatitis C: Secondary | ICD-10-CM | POA: Diagnosis not present

## 2013-09-29 DIAGNOSIS — Z944 Liver transplant status: Secondary | ICD-10-CM | POA: Diagnosis not present

## 2013-10-20 DIAGNOSIS — N200 Calculus of kidney: Secondary | ICD-10-CM | POA: Diagnosis not present

## 2013-11-02 DIAGNOSIS — L259 Unspecified contact dermatitis, unspecified cause: Secondary | ICD-10-CM | POA: Diagnosis not present

## 2013-11-08 DIAGNOSIS — Z944 Liver transplant status: Secondary | ICD-10-CM | POA: Diagnosis not present

## 2013-11-08 DIAGNOSIS — Z79899 Other long term (current) drug therapy: Secondary | ICD-10-CM | POA: Diagnosis not present

## 2013-11-08 DIAGNOSIS — B182 Chronic viral hepatitis C: Secondary | ICD-10-CM | POA: Diagnosis not present

## 2013-11-30 DIAGNOSIS — B182 Chronic viral hepatitis C: Secondary | ICD-10-CM | POA: Diagnosis not present

## 2013-11-30 DIAGNOSIS — Z944 Liver transplant status: Secondary | ICD-10-CM | POA: Diagnosis not present

## 2013-11-30 DIAGNOSIS — Z79899 Other long term (current) drug therapy: Secondary | ICD-10-CM | POA: Diagnosis not present

## 2013-12-20 DIAGNOSIS — E559 Vitamin D deficiency, unspecified: Secondary | ICD-10-CM | POA: Diagnosis not present

## 2013-12-20 DIAGNOSIS — F3289 Other specified depressive episodes: Secondary | ICD-10-CM | POA: Diagnosis not present

## 2013-12-20 DIAGNOSIS — F329 Major depressive disorder, single episode, unspecified: Secondary | ICD-10-CM | POA: Diagnosis not present

## 2013-12-20 DIAGNOSIS — Z136 Encounter for screening for cardiovascular disorders: Secondary | ICD-10-CM | POA: Diagnosis not present

## 2014-01-03 ENCOUNTER — Other Ambulatory Visit: Payer: Self-pay | Admitting: Dermatology

## 2014-01-03 DIAGNOSIS — Z79899 Other long term (current) drug therapy: Secondary | ICD-10-CM | POA: Diagnosis not present

## 2014-01-03 DIAGNOSIS — Z944 Liver transplant status: Secondary | ICD-10-CM | POA: Diagnosis not present

## 2014-01-03 DIAGNOSIS — L259 Unspecified contact dermatitis, unspecified cause: Secondary | ICD-10-CM | POA: Diagnosis not present

## 2014-01-03 DIAGNOSIS — B182 Chronic viral hepatitis C: Secondary | ICD-10-CM | POA: Diagnosis not present

## 2014-02-21 DIAGNOSIS — L259 Unspecified contact dermatitis, unspecified cause: Secondary | ICD-10-CM | POA: Diagnosis not present

## 2014-02-21 DIAGNOSIS — B079 Viral wart, unspecified: Secondary | ICD-10-CM | POA: Diagnosis not present

## 2014-02-22 DIAGNOSIS — Z881 Allergy status to other antibiotic agents status: Secondary | ICD-10-CM | POA: Diagnosis not present

## 2014-02-22 DIAGNOSIS — Z48298 Encounter for aftercare following other organ transplant: Secondary | ICD-10-CM | POA: Diagnosis not present

## 2014-02-22 DIAGNOSIS — Z885 Allergy status to narcotic agent status: Secondary | ICD-10-CM | POA: Diagnosis not present

## 2014-02-22 DIAGNOSIS — Z944 Liver transplant status: Secondary | ICD-10-CM | POA: Diagnosis not present

## 2014-02-22 DIAGNOSIS — Z1159 Encounter for screening for other viral diseases: Secondary | ICD-10-CM | POA: Diagnosis not present

## 2014-02-22 DIAGNOSIS — B259 Cytomegaloviral disease, unspecified: Secondary | ICD-10-CM | POA: Diagnosis not present

## 2014-02-22 DIAGNOSIS — M810 Age-related osteoporosis without current pathological fracture: Secondary | ICD-10-CM | POA: Diagnosis not present

## 2014-02-22 DIAGNOSIS — Z8505 Personal history of malignant neoplasm of liver: Secondary | ICD-10-CM | POA: Diagnosis not present

## 2014-02-22 DIAGNOSIS — Q618 Other cystic kidney diseases: Secondary | ICD-10-CM | POA: Diagnosis not present

## 2014-02-22 DIAGNOSIS — I10 Essential (primary) hypertension: Secondary | ICD-10-CM | POA: Diagnosis not present

## 2014-02-22 DIAGNOSIS — Z882 Allergy status to sulfonamides status: Secondary | ICD-10-CM | POA: Diagnosis not present

## 2014-02-22 DIAGNOSIS — F329 Major depressive disorder, single episode, unspecified: Secondary | ICD-10-CM | POA: Diagnosis not present

## 2014-02-22 DIAGNOSIS — Z79899 Other long term (current) drug therapy: Secondary | ICD-10-CM | POA: Diagnosis not present

## 2014-02-22 DIAGNOSIS — Z880 Allergy status to penicillin: Secondary | ICD-10-CM | POA: Diagnosis not present

## 2014-02-22 DIAGNOSIS — H5440 Blindness, one eye, unspecified eye: Secondary | ICD-10-CM | POA: Diagnosis not present

## 2014-02-22 DIAGNOSIS — F3289 Other specified depressive episodes: Secondary | ICD-10-CM | POA: Diagnosis not present

## 2014-02-22 DIAGNOSIS — B182 Chronic viral hepatitis C: Secondary | ICD-10-CM | POA: Diagnosis not present

## 2014-03-19 DIAGNOSIS — E559 Vitamin D deficiency, unspecified: Secondary | ICD-10-CM | POA: Diagnosis not present

## 2014-04-16 ENCOUNTER — Encounter (HOSPITAL_COMMUNITY): Payer: Self-pay | Admitting: General Practice

## 2014-05-02 DIAGNOSIS — N200 Calculus of kidney: Secondary | ICD-10-CM | POA: Diagnosis not present

## 2014-05-22 IMAGING — CT CT HEAD W/O CM
2 series · 16 of 30 positions shown, 20 images · non-contrast
Comparison: None.

CLINICAL DATA: Severe headache, worst ever.  Right frontal pain and
blurry vision with generalized weakness.

CT HEAD WITHOUT CONTRAST
TECHNIQUE: Contiguous axial images were obtained from the base of
the skull through the vertex without contrast.

[Series 2: head w/o · axial · non-contrast · 0.49mm/px · z∈[+11,+136]mm · 13 of 28 slices shown, 17 images]
[im 2/28  brain]
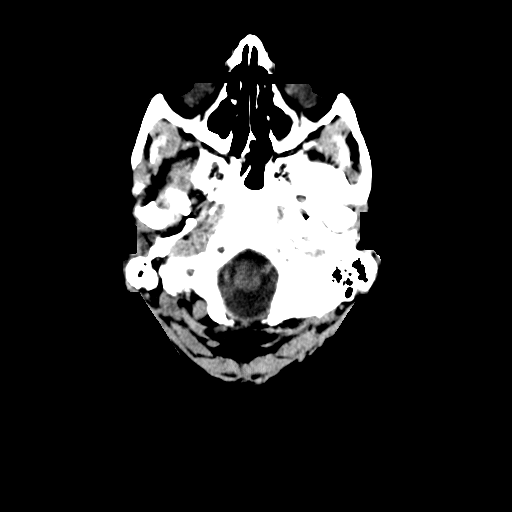
[im 2/28  bone]
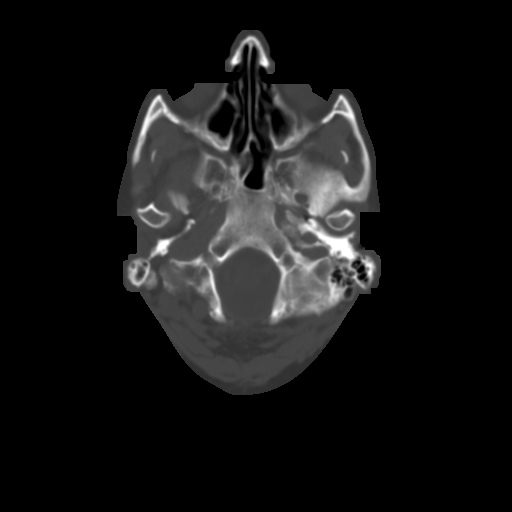
[im 4/28  brain]
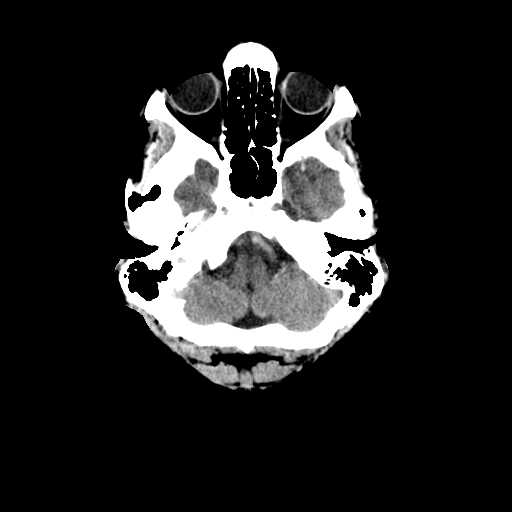
[im 6/28  brain]
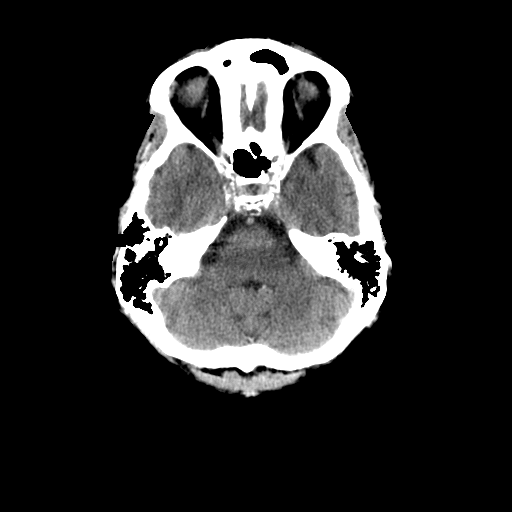
[im 8/28  brain]
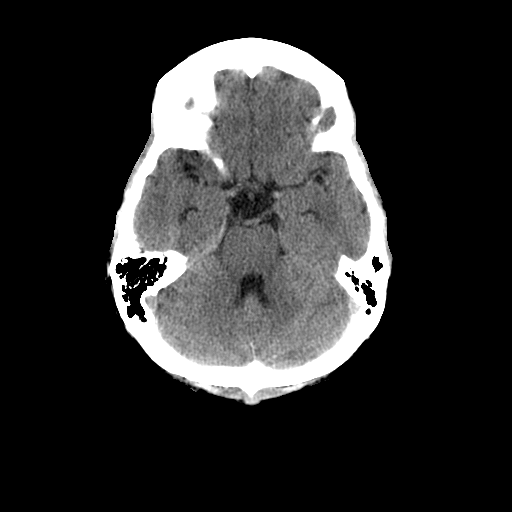
[im 10/28  brain]
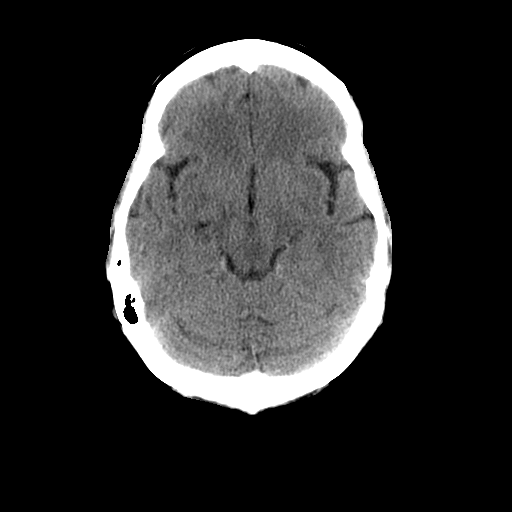
[im 10/28  bone]
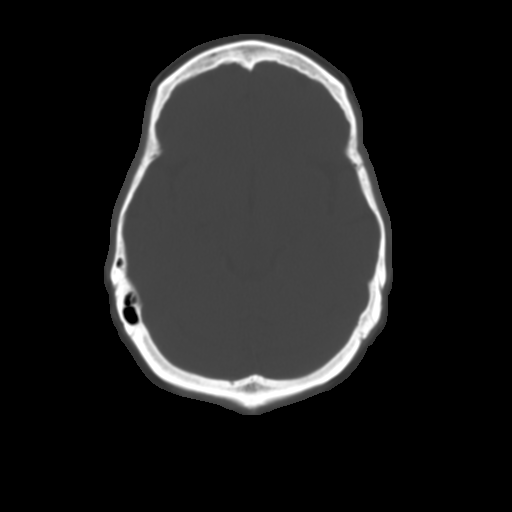
[im 12/28  brain]
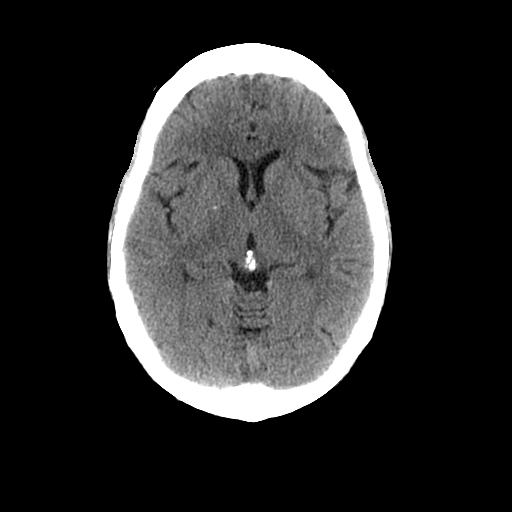
[im 14/28  brain]
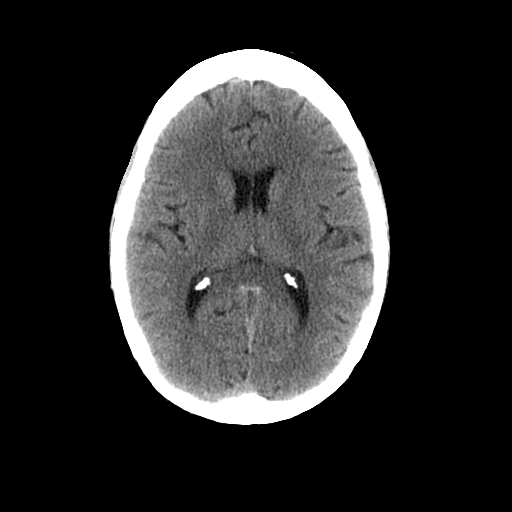
[im 16/28  brain]
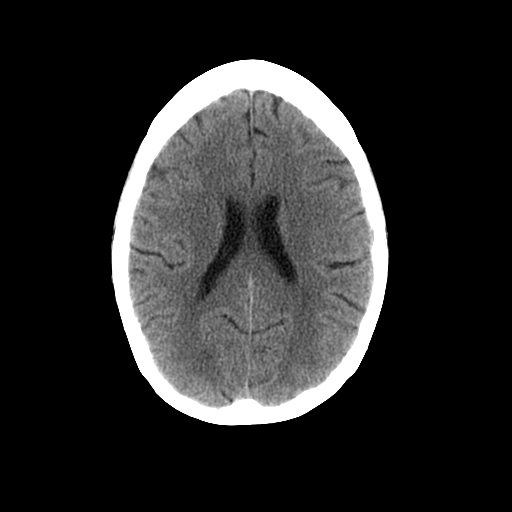
[im 18/28  brain]
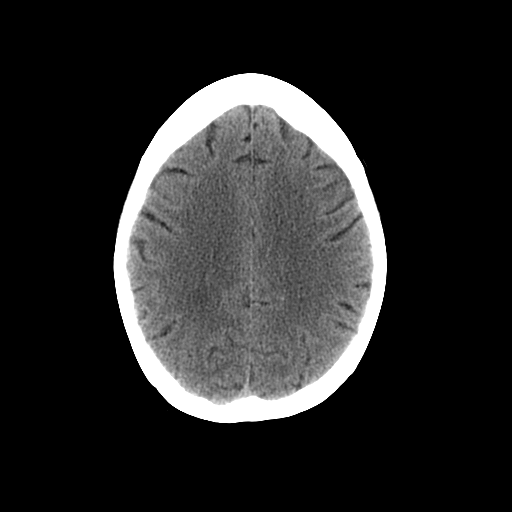
[im 18/28  bone]
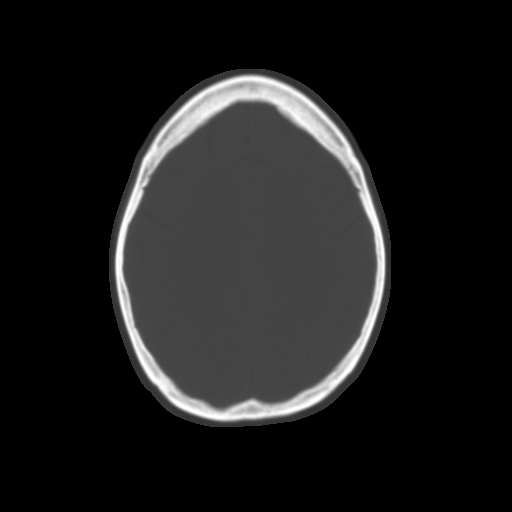
[im 20/28  brain]
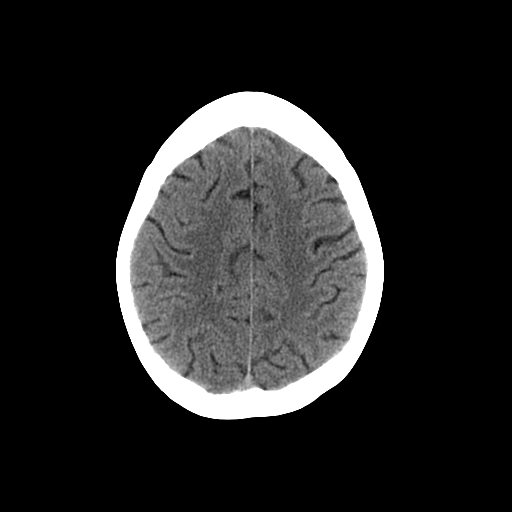
[im 22/28  brain]
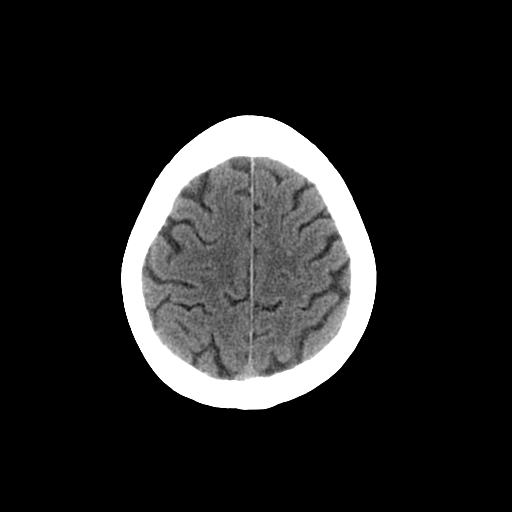
[im 24/28  brain]
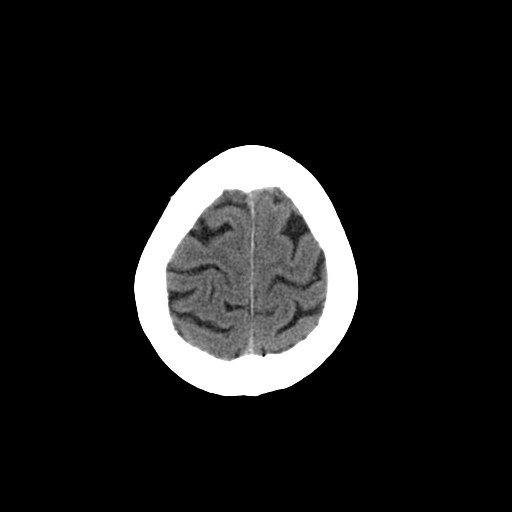
[im 26/28  brain]
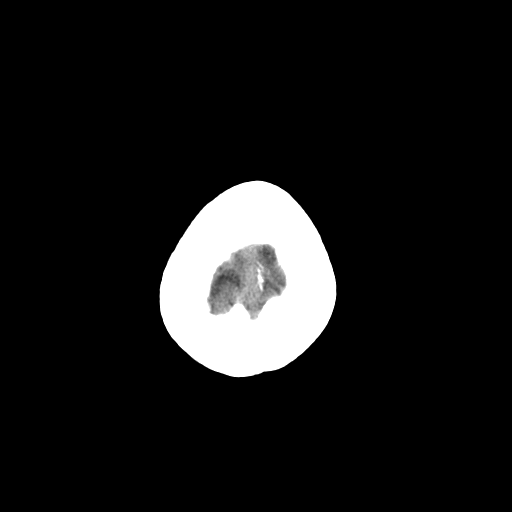
[im 26/28  bone]
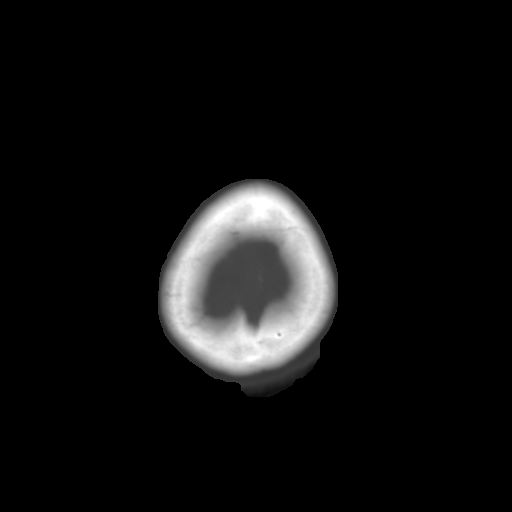

[Series 3: head bone · axial · 0.49mm/px · z∈[+11,+53]mm · 3 of 28 slices shown]
[im 2/28  bone]
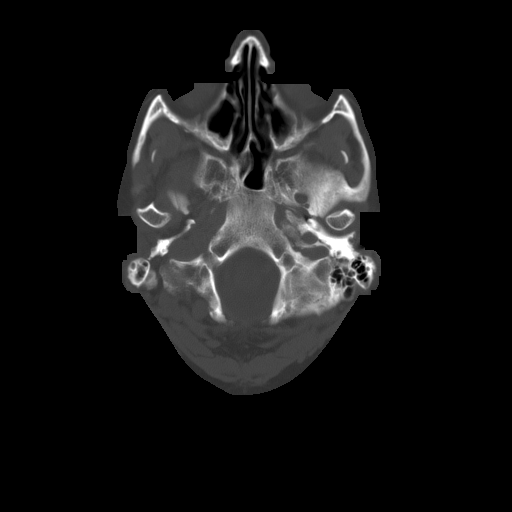
[im 6/28  bone]
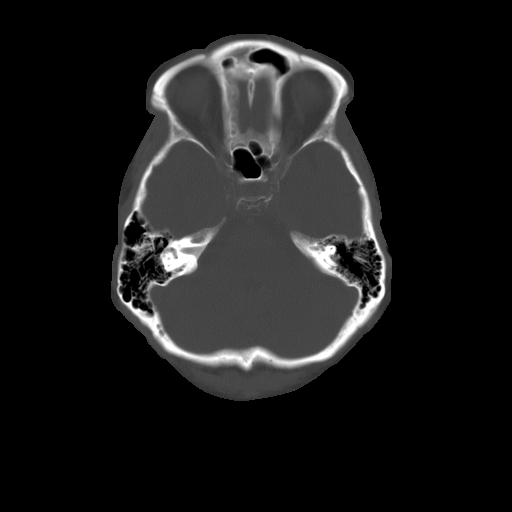
[im 10/28  bone]
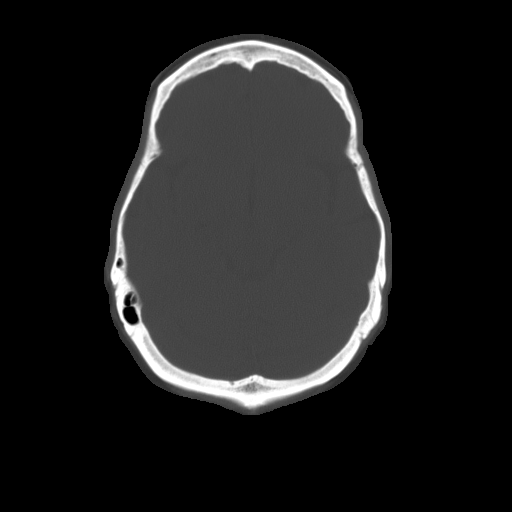

[16 of 30 positions shown; findings below may reference images not displayed]

FINDINGS: Calvarium: No acute osseous abnormality.  No lytic or blastic
lesion.

Orbits: Bilateral cataract resection.

Sinuses:  Upper maxillary posterior wall osseous thickening, no
fluid level or mucosal thickening currently.  There may have been
previous medial maxillary antrostomies.

Brain: No evidence of acute abnormality, such as acute infarction,
hemorrhage, hydrocephalus, or mass lesion/mass effect.Fairly
symmetric bilateral deep cerebral white matter low attenuation,
consistent with chronic small vessel ischemia.
IMPRESSION: 1. No evidence of acute intracranial disease.

2.  Chronic white matter changes.

## 2014-06-21 DIAGNOSIS — Z79899 Other long term (current) drug therapy: Secondary | ICD-10-CM | POA: Diagnosis not present

## 2014-06-21 DIAGNOSIS — B182 Chronic viral hepatitis C: Secondary | ICD-10-CM | POA: Diagnosis not present

## 2014-06-21 DIAGNOSIS — F322 Major depressive disorder, single episode, severe without psychotic features: Secondary | ICD-10-CM | POA: Diagnosis not present

## 2014-06-21 DIAGNOSIS — Z944 Liver transplant status: Secondary | ICD-10-CM | POA: Diagnosis not present

## 2014-06-21 DIAGNOSIS — E559 Vitamin D deficiency, unspecified: Secondary | ICD-10-CM | POA: Diagnosis not present

## 2014-06-21 DIAGNOSIS — Z8505 Personal history of malignant neoplasm of liver: Secondary | ICD-10-CM | POA: Diagnosis not present

## 2014-06-21 DIAGNOSIS — Z23 Encounter for immunization: Secondary | ICD-10-CM | POA: Diagnosis not present

## 2014-07-05 DIAGNOSIS — H4010X0 Unspecified open-angle glaucoma, stage unspecified: Secondary | ICD-10-CM | POA: Diagnosis not present

## 2014-07-05 DIAGNOSIS — Z961 Presence of intraocular lens: Secondary | ICD-10-CM | POA: Diagnosis not present

## 2014-07-05 DIAGNOSIS — H35352 Cystoid macular degeneration, left eye: Secondary | ICD-10-CM | POA: Diagnosis not present

## 2014-08-03 DIAGNOSIS — H34812 Central retinal vein occlusion, left eye: Secondary | ICD-10-CM | POA: Diagnosis not present

## 2014-08-03 DIAGNOSIS — H3581 Retinal edema: Secondary | ICD-10-CM | POA: Diagnosis not present

## 2014-08-03 DIAGNOSIS — Z961 Presence of intraocular lens: Secondary | ICD-10-CM | POA: Diagnosis not present

## 2014-08-08 DIAGNOSIS — F331 Major depressive disorder, recurrent, moderate: Secondary | ICD-10-CM | POA: Diagnosis not present

## 2014-08-10 DIAGNOSIS — Z944 Liver transplant status: Secondary | ICD-10-CM | POA: Diagnosis not present

## 2014-08-10 DIAGNOSIS — Z79899 Other long term (current) drug therapy: Secondary | ICD-10-CM | POA: Diagnosis not present

## 2014-08-10 DIAGNOSIS — Z1231 Encounter for screening mammogram for malignant neoplasm of breast: Secondary | ICD-10-CM | POA: Diagnosis not present

## 2014-08-10 DIAGNOSIS — B182 Chronic viral hepatitis C: Secondary | ICD-10-CM | POA: Diagnosis not present

## 2014-09-13 DIAGNOSIS — H3581 Retinal edema: Secondary | ICD-10-CM | POA: Diagnosis not present

## 2014-09-13 DIAGNOSIS — H34812 Central retinal vein occlusion, left eye: Secondary | ICD-10-CM | POA: Diagnosis not present

## 2014-10-10 DIAGNOSIS — Z944 Liver transplant status: Secondary | ICD-10-CM | POA: Diagnosis not present

## 2014-10-10 DIAGNOSIS — K769 Liver disease, unspecified: Secondary | ICD-10-CM | POA: Diagnosis not present

## 2014-10-10 DIAGNOSIS — Z79899 Other long term (current) drug therapy: Secondary | ICD-10-CM | POA: Diagnosis not present

## 2014-10-25 DIAGNOSIS — H34812 Central retinal vein occlusion, left eye: Secondary | ICD-10-CM | POA: Diagnosis not present

## 2014-10-25 DIAGNOSIS — H3581 Retinal edema: Secondary | ICD-10-CM | POA: Diagnosis not present

## 2014-10-30 DIAGNOSIS — N200 Calculus of kidney: Secondary | ICD-10-CM | POA: Diagnosis not present

## 2014-11-05 DIAGNOSIS — H4010X0 Unspecified open-angle glaucoma, stage unspecified: Secondary | ICD-10-CM | POA: Diagnosis not present

## 2014-11-05 DIAGNOSIS — Z961 Presence of intraocular lens: Secondary | ICD-10-CM | POA: Diagnosis not present

## 2014-11-05 DIAGNOSIS — H35352 Cystoid macular degeneration, left eye: Secondary | ICD-10-CM | POA: Diagnosis not present

## 2014-11-28 DIAGNOSIS — Z5181 Encounter for therapeutic drug level monitoring: Secondary | ICD-10-CM | POA: Diagnosis not present

## 2014-11-28 DIAGNOSIS — Z944 Liver transplant status: Secondary | ICD-10-CM | POA: Diagnosis not present

## 2014-12-06 DIAGNOSIS — H34812 Central retinal vein occlusion, left eye: Secondary | ICD-10-CM | POA: Diagnosis not present

## 2014-12-06 DIAGNOSIS — H3581 Retinal edema: Secondary | ICD-10-CM | POA: Diagnosis not present

## 2014-12-13 DIAGNOSIS — F331 Major depressive disorder, recurrent, moderate: Secondary | ICD-10-CM | POA: Diagnosis not present

## 2015-01-03 DIAGNOSIS — F330 Major depressive disorder, recurrent, mild: Secondary | ICD-10-CM | POA: Diagnosis not present

## 2015-01-03 DIAGNOSIS — Z944 Liver transplant status: Secondary | ICD-10-CM | POA: Diagnosis not present

## 2015-01-03 DIAGNOSIS — E559 Vitamin D deficiency, unspecified: Secondary | ICD-10-CM | POA: Diagnosis not present

## 2015-01-03 DIAGNOSIS — Z23 Encounter for immunization: Secondary | ICD-10-CM | POA: Diagnosis not present

## 2015-01-03 DIAGNOSIS — G459 Transient cerebral ischemic attack, unspecified: Secondary | ICD-10-CM | POA: Diagnosis not present

## 2015-01-03 DIAGNOSIS — Z8505 Personal history of malignant neoplasm of liver: Secondary | ICD-10-CM | POA: Diagnosis not present

## 2015-01-03 DIAGNOSIS — Z0000 Encounter for general adult medical examination without abnormal findings: Secondary | ICD-10-CM | POA: Diagnosis not present

## 2015-01-07 ENCOUNTER — Telehealth: Payer: Self-pay | Admitting: Neurology

## 2015-01-07 NOTE — Telephone Encounter (Signed)
The referring Dr office got patient in sooner somewhere else

## 2015-01-09 ENCOUNTER — Ambulatory Visit (INDEPENDENT_AMBULATORY_CARE_PROVIDER_SITE_OTHER): Payer: Medicare Other | Admitting: Neurology

## 2015-01-09 ENCOUNTER — Encounter: Payer: Self-pay | Admitting: Neurology

## 2015-01-09 VITALS — BP 133/83 | HR 75 | Ht 62.5 in | Wt 161.0 lb

## 2015-01-09 DIAGNOSIS — Z944 Liver transplant status: Secondary | ICD-10-CM | POA: Diagnosis not present

## 2015-01-09 DIAGNOSIS — R479 Unspecified speech disturbances: Secondary | ICD-10-CM | POA: Diagnosis not present

## 2015-01-09 NOTE — Progress Notes (Signed)
PATIENT: Candace Hill DOB: 06/18/1945  Chief Complaint  Patient presents with  . Transient Ischemic Attack    She had an episode of "going blank" and feeling like she was moving in slow motion in March 2016. She was unable to speak and felt confused.  She is unsure how long the symptoms lasted but she did return to her baseline the same day.  No further episodes have occurred.    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, she has received hepatitis C treatment in 2015, now is cured, glaucoma, fibromyalgia, vitamin D deficiency, central retinal vein occlusion, kidney stone, currently taking Prograf , Effexor 150 mg, metoprolol  Laboratory evaluation in July showed normal CMP, normal liver functional tests, vitamin D was normal 41.5, mild elevated LDL 133, cholesterol 193  In September 08 2014, after finished dinner with her brother, in the middle of the conversation, patient then he developed word finding difficulty, she was confused where she was, who she was with, symptoms last for a few minutes, few hours later, she was able to drive back home,  But since the event, patient complains of word finding difficulty, lack of energy, fatigue  REVIEW OF SYSTEMS: Full 14 system review of systems performed and notable only for fatigue, ringing ears, rash, itching, memory loss, confusion, weakness, depression, anxiety, decreased energy, disinterested in activities, hallucinations,  ALLERGIES: Allergies  Allergen Reactions  . Codeine Nausea Only  . Lidocaine     Heart palp   . Penicillins     Pt had transplant   . Phenergan [Promethazine]     Scary mind things, being  tortured while she was out.  . Sulfa Antibiotics Itching  . Tamsulosin Itching    Contains sulfa     HOME MEDICATIONS: Current Outpatient Prescriptions  Medication Sig Dispense Refill  . acetaminophen (TYLENOL) 500 MG tablet Take 500 mg by mouth every 6 (six) hours as needed for pain.    . brimonidine (ALPHAGAN P) 0.1 % SOLN Place 1 drop into both eyes 2 (two) times daily.    . cholecalciferol (VITAMIN D) 1000 UNITS tablet Take 1,000 Units by mouth daily.    . folic acid (FOLVITE) 458 MCG tablet Take 400 mcg by mouth daily.    . metoprolol tartrate (LOPRESSOR) 25 MG tablet Take 25 mg by mouth 2 (two) times daily.     Marland Kitchen omeprazole (PRILOSEC) 20 MG capsule Take 10 mg by mouth daily.    . potassium citrate (UROCIT-K) 10 MEQ (1080 MG) SR tablet Take 10 mEq by mouth 2 (two) times daily.     Marland Kitchen pyridOXINE (VITAMIN B-6) 100 MG tablet Take 100 mg by mouth daily.    . tacrolimus (PROGRAF) 1 MG capsule Take 1 mg by mouth 2 (two) times daily.    Marland Kitchen venlafaxine XR (EFFEXOR-XR) 150 MG 24 hr capsule Take 150 mg by mouth at bedtime. Take 150mg  twice daily, along with 75mg  twice daily    . venlafaxine XR (EFFEXOR-XR) 75 MG 24 hr capsule Take 75 mg by mouth at bedtime. Take with along with 150     No current facility-administered medications for this visit.    PAST MEDICAL HISTORY:  Past Medical History  Diagnosis Date  . History of renal stone   . Hepatitis C   . Hernia, femoral     left  . Acid reflux   . 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 Broken rib     x2  . Broken ankle     right  . Abnormal uterine bleeding   . Blood transfusion without reported diagnosis     with first pregnancy  . History of shingles   . Asthma   . Cancer     liver (in the one that was removed)  . Tailbone injury     broken x2  . Hypertension   . Depression   . Renal stone 11/2012  . Central retinal vein occlusion   . TIA (transient ischemic  attack)     PAST SURGICAL HISTORY: Past Surgical History  Procedure Laterality Date  . Cholecystectomy    . Tranplant  02/17/08    liver  . Cataract extraction      bilateral  . Tubal ligation    . Nasal sinus surgery      x3  . Tonsillectomy  age 35  . Femoral hernia repair      left leg    FAMILY HISTORY: Family History  Problem Relation Age of Onset  . Adopted: Yes  . Stroke Mother   . Alcoholism Father   . Alzheimer's disease Mother     SOCIAL HISTORY:  History   Social History  . Marital Status: Divorced    Spouse Name: N/A  . Number of Children: 2  . Years of Education: 14   Occupational History  . Disabled    Social History Main Topics  . Smoking status: Never Smoker   . Smokeless tobacco: Never Used  . Alcohol Use: No  . Drug Use: No  . Sexual Activity: No     Comment: 14 years ago   Other Topics Concern  . Not on file   Social History Narrative   Lives at alone alone.   Right-handed.   No caffeine use.     PHYSICAL EXAM   Filed Vitals:   01/09/15 1112  BP: 133/83  Pulse: 75  Height: 5' 2.5" (1.588 m)  Weight: 161 lb (73.029 kg)    Not recorded      Body mass index is 28.96 kg/(m^2).  PHYSICAL EXAMNIATION:  Gen: NAD, conversant, well nourised, obese, well groomed                     Cardiovascular: Regular rate rhythm, no peripheral edema, warm, nontender. Eyes: Conjunctivae clear without exudates or hemorrhage Neck: Supple, no carotid bruise. Pulmonary: Clear to auscultation bilaterally   NEUROLOGICAL EXAM:  MENTAL STATUS: Speech:    Speech is normal; fluent and spontaneous with normal comprehension.  Cognition:    The patient is oriented to person, place, and time;     recent and remote memory intact;     language fluent;     normal attention, concentration,     fund of knowledge.  CRANIAL NERVES: CN II: Visual fields are full to confrontation. Pupils were equal round reactive to light, funduscopy examination showed  sharp discs bilaterally CN III, IV, VI: extraocular movement are normal. No ptosis. CN V: Facial sensation is intact to pinprick in all 3 divisions bilaterally. Corneal responses are intact.  CN VII: Face is symmetric with normal eye closure  and smile. CN VIII: Hearing is normal to rubbing fingers CN IX, X: Palate elevates symmetrically. Phonation is normal. CN XI: Head turning and shoulder shrug are intact CN XII: Tongue is midline with normal movements and no atrophy.  MOTOR: There is no pronator drift of out-stretched arms. Muscle bulk and tone are normal. Muscle strength is normal.  REFLEXES: Reflexes are 2+ and symmetric at the biceps, triceps, knees, and ankles. Plantar responses are flexor.  SENSORY: Light touch, pinprick, position sense, and vibration sense are intact in fingers and toes.  COORDINATION: Rapid alternating movements and fine finger movements are intact. There is no dysmetria on finger-to-nose and heel-knee-shin. There are no abnormal or extraneous movements.   GAIT/STANCE: Posture is normal. Gait is steady with normal steps, base, arm swing, and turning. Heel and toe walking are normal. Tandem gait is normal.  Romberg is absent.  DIAGNOSTIC DATA (LABS, IMAGING, TESTING) - I reviewed patient records, labs, notes, testing and imaging myself where available.  Lab Results  Component Value Date   WBC 5.7 12/03/2006   HGB 13.1 12/03/2006   HCT 38.6 12/03/2006   MCV 91.7 12/03/2006   PLT 81* 12/03/2006      Component Value Date/Time   NA 141 11/03/2006 1332   K 4.0 11/03/2006 1332   CL 108 11/03/2006 1332   CO2 25 11/03/2006 1332   GLUCOSE 90 11/03/2006 1332   BUN 14 11/03/2006 1332   CREATININE 0.58 11/03/2006 1332   CALCIUM 8.7 11/03/2006 1332   PROT 7.0 11/03/2006 1332   ALBUMIN 4.0 11/03/2006 1332   AST 162* 11/03/2006 1332   ALT 121* 11/03/2006 1332   ALKPHOS 84 11/03/2006 1332   BILITOT 1.3* 11/03/2006 1332   ASSESSMENT AND PLAN  Candace Hill is a 69 y.o. female with history of hepatitis C, status post liver transplant, received hepatitis C treatment, now is cured, hyperlipidemia, hypertension, presented with acute onset of word finding difficulties,  Most suggestive of left frontal TIA,   proceed with MRI of brain Aspirin 81 mg daily Return to clinic in one month    Marcial Pacas, M.D. Ph.D.  [facility] [address], [place], [place] [address] Ph: [phone] Fax: [phone]

## 2015-01-15 ENCOUNTER — Ambulatory Visit
Admission: RE | Admit: 2015-01-15 | Discharge: 2015-01-15 | Disposition: A | Discharge status: Home / Self Care | Payer: Medicare Other | Source: Ambulatory Visit | Attending: Neurology | Admitting: Neurology

## 2015-01-15 DIAGNOSIS — R479 Unspecified speech disturbances: Secondary | ICD-10-CM | POA: Diagnosis not present

## 2015-01-17 ENCOUNTER — Telehealth: Payer: Self-pay | Admitting: Neurology

## 2015-01-17 DIAGNOSIS — H3581 Retinal edema: Secondary | ICD-10-CM | POA: Diagnosis not present

## 2015-01-17 DIAGNOSIS — H34812 Central retinal vein occlusion, left eye: Secondary | ICD-10-CM | POA: Diagnosis not present

## 2015-01-17 DIAGNOSIS — Z961 Presence of intraocular lens: Secondary | ICD-10-CM | POA: Diagnosis not present

## 2015-01-17 NOTE — Telephone Encounter (Signed)
Candace Hill, please call patient MRI showed mild generalized atrophy, small vessel disease, I will go over details with her at her next follow-up visit  Abnormal MRI brain (without) demonstrating: 1. 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  2. Small focal cystic lesion in the right genu of corpus callosum, may represent chronic ischemic infarct.  3. No acute findings.

## 2015-01-17 NOTE — Telephone Encounter (Signed)
Spoke to patient - she is aware of results and will keep follow up appointment to further discuss.

## 2015-01-28 ENCOUNTER — Telehealth: Payer: Self-pay | Admitting: Neurology

## 2015-01-28 NOTE — Telephone Encounter (Signed)
Patient is calling to speak with Dr. Krista Blue prior to her 9/6 office appointment and would like her MRI results explained. Thanks!

## 2015-01-28 NOTE — Telephone Encounter (Signed)
Appointment moved to earlier date.

## 2015-01-29 DIAGNOSIS — Z944 Liver transplant status: Secondary | ICD-10-CM | POA: Diagnosis not present

## 2015-01-29 DIAGNOSIS — Z5181 Encounter for therapeutic drug level monitoring: Secondary | ICD-10-CM | POA: Diagnosis not present

## 2015-01-30 ENCOUNTER — Encounter: Payer: Self-pay | Admitting: Neurology

## 2015-01-30 ENCOUNTER — Ambulatory Visit (INDEPENDENT_AMBULATORY_CARE_PROVIDER_SITE_OTHER): Payer: Medicare Other | Admitting: Neurology

## 2015-01-30 VITALS — BP 127/81 | HR 64 | Ht 62.5 in | Wt 162.0 lb

## 2015-01-30 DIAGNOSIS — G451 Carotid artery syndrome (hemispheric): Secondary | ICD-10-CM

## 2015-01-30 NOTE — Progress Notes (Signed)
Chief Complaint  Patient presents with  . Speech Abnormality    She is concernd about her MRI results.  She would also like to know if she should start taking one 81mg  aspirin daily.      PATIENT: Candace Hill DOB: Nov 22, 1945  Chief Complaint  Patient presents with  . Speech Abnormality    She is concernd about her MRI results.  She would also like to know if she should start taking one 81mg  aspirin daily.    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  Laboratory evaluation in July showed normal CMP, normal liver functional tests, vitamin D was normal 41.5, mild elevated LDL 133, cholesterol 193  In September 08 2014, after finished dinner with her brother, in the middle of the conversation, she developed sudden onset word finding difficulty, she was confused where she was, who she was with, she could not move, could not talk,nsymptoms last for 5-10 minutes,  few hours later she was able to drive back home,  But since the event, patient complains of word finding difficulty, lack of energy, fatigue  UPDATE January 30 2015: Today she reported one episode of sudden left visual loss in 2009, shortly after liver transplant, which has been persistent since then, today's left vision is 20/400, she was diagnosed with left retinal ischemia event,,  We have reviewed MRI of the brain without contrast in August 2016, periventricular small vessel disease, right genu of corpus  callosum small infarction,   REVIEW OF SYSTEMS: Full 14 system review of systems performed and notable only for   ALLERGIES: Allergies  Allergen Reactions  . Codeine Nausea Only  . Lidocaine     Heart palp   . Penicillins     Pt had transplant   . Phenergan [Promethazine]     Scary mind things, being tortured while she was out.  . Sulfa Antibiotics Itching  . Tamsulosin Itching    Contains sulfa     HOME MEDICATIONS: Current Outpatient Prescriptions  Medication Sig Dispense Refill  . acetaminophen (TYLENOL) 500 MG tablet Take 500 mg by mouth every 6 (six) hours as needed for pain.    Marland Kitchen aspirin 81 MG tablet Take 81 mg by mouth daily.    . brimonidine (ALPHAGAN P) 0.1 % SOLN Place 1 drop into both eyes 2 (two) times daily.    . cholecalciferol (VITAMIN D) 1000 UNITS tablet Take 1,000 Units by mouth daily.    . folic acid (FOLVITE) 865 MCG tablet Take 400 mcg by mouth daily.    . metoprolol tartrate (LOPRESSOR) 25 MG tablet Take 25 mg by mouth 2 (two) times daily.     . potassium citrate (UROCIT-K) 10 MEQ (1080 MG) SR tablet Take 10 mEq by mouth 2 (two) times daily.     Marland Kitchen pyridOXINE (VITAMIN B-6) 100 MG tablet Take 100 mg by mouth daily.    . tacrolimus (PROGRAF) 0.5 MG capsule Take 1.5 mg by mouth.    . venlafaxine XR (EFFEXOR-XR) 150 MG 24  hr capsule Take 150 mg by mouth at bedtime. Take 150mg  twice daily, along with 75mg  twice daily    . venlafaxine XR (EFFEXOR-XR) 75 MG 24 hr capsule Take 75 mg by mouth at bedtime. Take with along with 150     No current facility-administered medications for this visit.    PAST MEDICAL HISTORY: Past Medical History  Diagnosis Date  . History of renal stone   . Hepatitis C   . Hernia, femoral     left  . Acid reflux   . 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 Broken rib     x2  . Broken ankle     right  . Abnormal uterine bleeding   . Blood transfusion without reported  diagnosis     with first pregnancy  . History of shingles   . Asthma   . Cancer     liver (in the one that was removed)  . Tailbone injury     broken x2  . Hypertension   . Depression   . Renal stone 11/2012  . Central retinal vein occlusion   . TIA (transient ischemic attack)     PAST SURGICAL HISTORY: Past Surgical History  Procedure Laterality Date  . Cholecystectomy    . Tranplant  02/17/08    liver  . Cataract extraction      bilateral  . Tubal ligation    . Nasal sinus surgery      x3  . Tonsillectomy  age 56  . Femoral hernia repair      left leg    FAMILY HISTORY: Family History  Problem Relation Age of Onset  . Adopted: Yes  . Stroke Mother   . Alcoholism Father   . Alzheimer's disease Mother     SOCIAL HISTORY:  Social History   Social History  . Marital Status: Divorced    Spouse Name: N/A  . Number of Children: 2  . Years of Education: 14   Occupational History  . Disabled    Social History Main Topics  . Smoking status: Never Smoker   . Smokeless tobacco: Never Used  . Alcohol Use: No  . Drug Use: No  . Sexual Activity: No     Comment: 14 years ago   Other Topics Concern  . Not on file   Social History Narrative   Lives at alone alone.   Right-handed.   No caffeine use.     PHYSICAL EXAM   Filed Vitals:   01/30/15 1621  BP: 127/81  Pulse: 64  Height: 5' 2.5" (1.588 m)  Weight: 162 lb (73.483 kg)    Not recorded      Body mass index is 29.14 kg/(m^2).  PHYSICAL EXAMNIATION:  Gen: NAD, conversant, well nourised, obese, well groomed                     Cardiovascular: Regular rate rhythm, no peripheral edema, warm, nontender. Eyes: Conjunctivae clear without exudates or hemorrhage Neck: Supple, no carotid bruise. Pulmonary: Clear to auscultation bilaterally   NEUROLOGICAL EXAM:  MENTAL STATUS: Speech:    Speech is normal; fluent and spontaneous with normal comprehension.  Cognition:    The patient is oriented to  person, place, and time;     recent and remote memory intact;     language fluent;     normal attention, concentration,     fund of knowledge.  CRANIAL NERVES: CN II: Visual fields are full to confrontation. Pupils were equal round reactive to light, funduscopy examination showed sharp discs bilaterally CN III, IV, VI: extraocular movement are normal. No ptosis. CN V: Facial sensation is intact to pinprick in all 3 divisions bilaterally. Corneal responses are intact.  CN VII: Face is symmetric with normal eye closure and smile. CN VIII: Hearing is normal to rubbing fingers CN IX, X: Palate elevates symmetrically. Phonation is normal. CN XI: Head turning and shoulder shrug are intact CN XII: Tongue is midline with normal movements and no atrophy.  MOTOR: There is no pronator drift of out-stretched arms. Muscle bulk and tone are normal. Muscle strength is normal.  REFLEXES: Reflexes are 2+ and symmetric at the biceps, triceps, knees, and ankles. Plantar responses are flexor.  SENSORY: Light touch, pinprick, position sense, and vibration sense are intact in fingers and toes.  COORDINATION: Rapid alternating movements and fine finger movements are intact. There is no dysmetria on finger-to-nose and heel-knee-shin. There are no abnormal or extraneous movements.   GAIT/STANCE: Posture is normal. Gait is steady with normal steps, base, arm swing, and turning. Heel and toe walking are normal. Tandem gait is normal.  Romberg is absent.  DIAGNOSTIC DATA (LABS, IMAGING, TESTING) - I reviewed patient records, labs, notes, testing and imaging myself where available.  Lab Results  Component Value Date   WBC 5.7 12/03/2006   HGB 13.1 12/03/2006   HCT 38.6 12/03/2006   MCV 91.7 12/03/2006   PLT 81* 12/03/2006      Component Value Date/Time   NA 141 11/03/2006 1332   K 4.0 11/03/2006 1332   CL 108 11/03/2006 1332   CO2 25 11/03/2006 1332   GLUCOSE 90 11/03/2006 1332   BUN 14  11/03/2006 1332   CREATININE 0.58 11/03/2006 1332   CALCIUM 8.7 11/03/2006 1332   PROT 7.0 11/03/2006 1332   ALBUMIN 4.0 11/03/2006 1332   AST 162* 11/03/2006 1332   ALT 121* 11/03/2006 1332   ALKPHOS 84 11/03/2006 1332   BILITOT 1.3* 11/03/2006 1332   ASSESSMENT AND PLAN  Faylene Allerton is a 69 y.o. female with history of hepatitis C, status post liver transplant, received hepatitis C antivirus treatment, now is cured, she has a vascular risk factor of hyperlipidemia, hypertension, presented with acute onset of word finding difficulties, suggestive of left frontal/MCA TIA, also had a history of left retinal ischemic event  Transient ischemic event, involving left MCA MRI of brain showed small vessel disease, Complete evaluation with ultrasound of carotid artery Echocardiogram Keep daily aspirin Return to clinic in 2 months   Marcial Pacas, M.D. Ph.D.  [facility] [address], [place], [place] [address] Ph: [phone] Fax: [phone]

## 2015-02-04 DIAGNOSIS — M8589 Other specified disorders of bone density and structure, multiple sites: Secondary | ICD-10-CM | POA: Diagnosis not present

## 2015-02-04 DIAGNOSIS — M8588 Other specified disorders of bone density and structure, other site: Secondary | ICD-10-CM | POA: Diagnosis not present

## 2015-02-07 ENCOUNTER — Ambulatory Visit (HOSPITAL_COMMUNITY): Payer: Medicare Other | Attending: Cardiology

## 2015-02-07 ENCOUNTER — Other Ambulatory Visit: Payer: Self-pay

## 2015-02-07 ENCOUNTER — Telehealth: Payer: Self-pay | Admitting: Neurology

## 2015-02-07 DIAGNOSIS — I071 Rheumatic tricuspid insufficiency: Secondary | ICD-10-CM | POA: Diagnosis not present

## 2015-02-07 DIAGNOSIS — I5189 Other ill-defined heart diseases: Secondary | ICD-10-CM | POA: Diagnosis not present

## 2015-02-07 DIAGNOSIS — G451 Carotid artery syndrome (hemispheric): Secondary | ICD-10-CM | POA: Diagnosis not present

## 2015-02-07 NOTE — Telephone Encounter (Signed)
Please call patient, no significant abnormality on echocardiogram

## 2015-02-08 ENCOUNTER — Telehealth: Payer: Self-pay

## 2015-02-08 NOTE — Telephone Encounter (Signed)
LVM for patient to call office to receive test results.  Ok to inform patient that echocardiogram has no abnormalities.

## 2015-02-08 NOTE — Telephone Encounter (Signed)
I advised the patient that her echocardiogram has no abnormalities.

## 2015-02-11 NOTE — Telephone Encounter (Signed)
Left message for a return call

## 2015-02-11 NOTE — Telephone Encounter (Signed)
Spoke to patient - she is aware of results and verbalized understanding. 

## 2015-02-13 DIAGNOSIS — Z944 Liver transplant status: Secondary | ICD-10-CM | POA: Diagnosis not present

## 2015-02-13 DIAGNOSIS — Z5181 Encounter for therapeutic drug level monitoring: Secondary | ICD-10-CM | POA: Diagnosis not present

## 2015-02-14 ENCOUNTER — Ambulatory Visit (INDEPENDENT_AMBULATORY_CARE_PROVIDER_SITE_OTHER): Payer: Medicare Other

## 2015-02-14 DIAGNOSIS — G451 Carotid artery syndrome (hemispheric): Secondary | ICD-10-CM

## 2015-02-19 ENCOUNTER — Ambulatory Visit: Payer: Medicare Other | Admitting: Neurology

## 2015-02-21 ENCOUNTER — Telehealth: Payer: Self-pay | Admitting: Neurology

## 2015-02-21 NOTE — Telephone Encounter (Signed)
Patient called requesting results from doppler study. She states she is having problems with liver transplant(numbers are off) and Dr Monica Martinez is wanting to know what has transpired here. Please call and advise. Patient can be reached at [phone].

## 2015-02-22 NOTE — Telephone Encounter (Signed)
Less likely, any of the ultrasound of carotid artery will be related to her current liver transplant issues,  Will call patient ultrasound of carotid report when is becomes available.

## 2015-02-26 NOTE — Telephone Encounter (Signed)
Spoke to Dr. Leonie Man - reviewed ultrasound of carotid - normal.  Dr. Krista Blue aware.

## 2015-02-26 NOTE — Telephone Encounter (Signed)
Patient is aware of normal results

## 2015-02-28 DIAGNOSIS — Z5181 Encounter for therapeutic drug level monitoring: Secondary | ICD-10-CM | POA: Diagnosis not present

## 2015-02-28 DIAGNOSIS — Z944 Liver transplant status: Secondary | ICD-10-CM | POA: Diagnosis not present

## 2015-03-04 ENCOUNTER — Ambulatory Visit: Payer: PRIVATE HEALTH INSURANCE | Admitting: Neurology

## 2015-03-15 DIAGNOSIS — Z5181 Encounter for therapeutic drug level monitoring: Secondary | ICD-10-CM | POA: Diagnosis not present

## 2015-03-15 DIAGNOSIS — Z944 Liver transplant status: Secondary | ICD-10-CM | POA: Diagnosis not present

## 2015-03-20 DIAGNOSIS — F331 Major depressive disorder, recurrent, moderate: Secondary | ICD-10-CM | POA: Diagnosis not present

## 2015-03-21 DIAGNOSIS — H3581 Retinal edema: Secondary | ICD-10-CM | POA: Diagnosis not present

## 2015-03-21 DIAGNOSIS — Z961 Presence of intraocular lens: Secondary | ICD-10-CM | POA: Diagnosis not present

## 2015-03-21 DIAGNOSIS — H348192 Central retinal vein occlusion, unspecified eye, stable: Secondary | ICD-10-CM | POA: Diagnosis not present

## 2015-04-01 ENCOUNTER — Ambulatory Visit (INDEPENDENT_AMBULATORY_CARE_PROVIDER_SITE_OTHER): Payer: Medicare Other | Admitting: Neurology

## 2015-04-01 ENCOUNTER — Encounter: Payer: Self-pay | Admitting: Neurology

## 2015-04-01 VITALS — BP 125/83 | HR 73 | Ht 62.5 in | Wt 166.0 lb

## 2015-04-01 DIAGNOSIS — R51 Headache: Secondary | ICD-10-CM

## 2015-04-01 DIAGNOSIS — G4719 Other hypersomnia: Secondary | ICD-10-CM | POA: Diagnosis not present

## 2015-04-01 DIAGNOSIS — R519 Headache, unspecified: Secondary | ICD-10-CM

## 2015-04-01 DIAGNOSIS — G8929 Other chronic pain: Secondary | ICD-10-CM

## 2015-04-01 MED ORDER — PROPRANOLOL HCL ER 80 MG PO CP24: 80.0000 mg | ORAL_CAPSULE | Freq: Every day | ORAL | Status: DC

## 2015-04-01 MED ORDER — RIZATRIPTAN BENZOATE 5 MG PO TBDP: 5.0000 mg | ORAL_TABLET | ORAL | Status: DC | PRN

## 2015-04-01 NOTE — Progress Notes (Signed)
Chief Complaint  Patient presents with  . Hemispheric Carotid Artery Syndrome    She has brought recent labs for review.  She has been having more frequent headaches recently.      PATIENT: Candace Hill DOB: [birth year]  Chief Complaint  Patient presents with  . Hemispheric Carotid Artery Syndrome    She has brought recent labs for review.  She has been having more frequent headaches recently.    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  Laboratory evaluation in July showed normal CMP, normal liver functional tests, vitamin D was normal 41.5, mild elevated LDL 133, cholesterol 193  In September 08 2014, after finished dinner with her brother, in the middle of the conversation, she developed sudden onset word finding difficulty, she was confused where she was, who she was with, she could not move, could not talk,nsymptoms last for 5-10 minutes,  few hours later she was able to drive back home,  But since the event, patient complains of word finding difficulty, lack of energy, fatigue  UPDATE January 30 2015: Today she reported one episode of sudden left visual loss in 2009, shortly after liver transplant, which has been persistent since then, today's left vision is 20/400, she was diagnosed with left retinal ischemia event,,  We have reviewed MRI of the brain without contrast in August 2016, periventricular small vessel disease, 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 She has a history of left retinal artery occlusion, with poor left vision  I reviewed laboratory evaluation since August 2016, normal CBC, CMP, creatinine was 1.01,   She also complains of excessive daytime sleepiness, ESS score is 12  REVIEW OF SYSTEMS: Full 14 system review of systems performed and notable only for   ALLERGIES: Allergies  Allergen Reactions  . Codeine Nausea Only  . Lidocaine     Heart palp   . Penicillins     Pt had transplant   . Phenergan [Promethazine]     Scary mind things, being tortured while she was out.  . Sulfa Antibiotics Itching  . Tamsulosin Itching    Contains sulfa     HOME MEDICATIONS: Current Outpatient Prescriptions  Medication Sig Dispense Refill  . acetaminophen (TYLENOL) 500 MG tablet Take 500 mg by mouth every 6 (six) hours as needed for pain.    Marland Kitchen aspirin 81 MG tablet Take 81 mg by mouth daily.    . brimonidine (ALPHAGAN P) 0.1 % SOLN Place 1 drop into both eyes 2 (two) times daily.    . cholecalciferol (VITAMIN D) 1000 UNITS tablet Take 1,000 Units  by mouth daily.    . folic acid (FOLVITE) 283 MCG tablet Take 400 mcg by mouth daily.    . metoprolol tartrate (LOPRESSOR) 25 MG tablet Take 25 mg by mouth 2 (two) times daily.     . potassium citrate (UROCIT-K) 10 MEQ (1080 MG) SR tablet Take 10 mEq by mouth 2 (two) times daily.     Marland Kitchen pyridOXINE (VITAMIN B-6) 100 MG tablet Take 100 mg by mouth daily.    . tacrolimus (PROGRAF) 0.5 MG capsule Take 1.5 mg by mouth 2 (two)  times daily.     Marland Kitchen venlafaxine XR (EFFEXOR-XR) 150 MG 24 hr capsule Take by mouth. Take 150mg  along with 75mg  at bedtime.    Marland Kitchen venlafaxine XR (EFFEXOR-XR) 75 MG 24 hr capsule Take by mouth. Take 150mg , along with 75mg  at bedtime.     No current facility-administered medications for this visit.    PAST MEDICAL HISTORY: Past Medical History  Diagnosis Date  . History of renal stone   . Hepatitis C   . Hernia, femoral     left  . Acid reflux   . 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 Broken rib     x2  . Broken ankle     right  . Abnormal uterine bleeding   . Blood transfusion without reported diagnosis     with first pregnancy  . History of shingles   . Asthma   . Cancer ([place])     liver (in the one that was removed)  . Tailbone injury     broken x2  . Hypertension   . Depression   . Renal stone 11/2012  . Central retinal vein occlusion   . TIA (transient ischemic attack)     PAST SURGICAL HISTORY: Past Surgical History  Procedure Laterality Date  . Cholecystectomy    . Tranplant  02/17/08    liver  . Cataract extraction      bilateral  . Tubal ligation    . Nasal sinus surgery      x3  . Tonsillectomy  age 65  . Femoral hernia repair      left leg    FAMILY HISTORY: Family History  Problem Relation Age of Onset  . Adopted: Yes  . Stroke Mother   . Alcoholism Father   . Alzheimer's disease Mother     SOCIAL HISTORY:  Social History   Social History  . Marital Status: Divorced    Spouse Name: N/A  . Number of Children: 2  . Years of Education: 14   Occupational History  . Disabled    Social History Main Topics  . Smoking status: Never Smoker   . Smokeless tobacco: Never Used  . Alcohol Use: No  . Drug Use: No  . Sexual Activity: No     Comment: 14 years ago   Other Topics Concern  . Not on file   Social History Narrative   Lives at alone alone.   Right-handed.   No caffeine  use.     PHYSICAL EXAM   Filed Vitals:   04/01/15 1431  BP: 125/83  Pulse: 73  Height: 5' 2.5" (1.588 m)  Weight: 166 lb (75.297 kg)    Not recorded      Body mass index is 29.86 kg/(m^2).  PHYSICAL EXAMNIATION:  Gen: NAD, conversant, well nourised, obese, well groomed  Cardiovascular: Regular rate rhythm, no peripheral edema, warm, nontender. Eyes: Conjunctivae clear without exudates or hemorrhage Neck: Supple, no carotid bruise. Pulmonary: Clear to auscultation bilaterally   NEUROLOGICAL EXAM:  MENTAL STATUS: Speech:    Speech is normal; fluent and spontaneous with normal comprehension.  Cognition:    The patient is oriented to person, place, and time;     recent and remote memory intact;     language fluent;     normal attention, concentration,     fund of knowledge.  CRANIAL NERVES: CN II: Visual fields are full to confrontation. Pupils were equal round reactive to light, funduscopy examination showed sharp discs bilaterally CN III, IV, VI: extraocular movement are normal. No ptosis. CN V: Facial sensation is intact to pinprick in all 3 divisions bilaterally. Corneal responses are intact.  CN VII: Face is symmetric with normal eye closure and smile. CN VIII: Hearing is normal to rubbing fingers CN IX, X: Palate elevates symmetrically. Phonation is normal. CN XI: Head turning and shoulder shrug are intact CN XII: Tongue is midline with normal movements and no atrophy.  MOTOR: There is no pronator drift of out-stretched arms. Muscle bulk and tone are normal. Muscle strength is normal.  REFLEXES: Reflexes are 2+ and symmetric at the biceps, triceps, knees, and ankles. Plantar responses are flexor.  SENSORY: Light touch, pinprick, position sense, and vibration sense are intact in fingers and toes.  COORDINATION: Rapid alternating movements and fine finger movements are intact. There is no dysmetria on finger-to-nose and heel-knee-shin.  There are no abnormal or extraneous movements.   GAIT/STANCE: Posture is normal. Gait is steady with normal steps, base, arm swing, and turning. Heel and toe walking are normal. Tandem gait is normal.  Romberg is absent.  DIAGNOSTIC DATA (LABS, IMAGING, TESTING) - I reviewed patient records, labs, notes, testing and imaging myself where available.  Lab Results  Component Value Date   WBC 5.7 12/03/2006   HGB 13.1 12/03/2006   HCT 38.6 12/03/2006   MCV 91.7 12/03/2006   PLT 81* 12/03/2006      Component Value Date/Time   NA 141 11/03/2006 1332   K 4.0 11/03/2006 1332   CL 108 11/03/2006 1332   CO2 25 11/03/2006 1332   GLUCOSE 90 11/03/2006 1332   BUN 14 11/03/2006 1332   CREATININE 0.58 11/03/2006 1332   CALCIUM 8.7 11/03/2006 1332   PROT 7.0 11/03/2006 1332   ALBUMIN 4.0 11/03/2006 1332   AST 162* 11/03/2006 1332   ALT 121* 11/03/2006 1332   ALKPHOS 84 11/03/2006 1332   BILITOT 1.3* 11/03/2006 1332   ASSESSMENT AND PLAN  Marrian Bells is a 69 y.o. female with history of hepatitis C, status post liver transplant, received hepatitis C antivirus treatment, now is cured, she has a vascular risk factor of hyperlipidemia, hypertension, presented with acute onset of word finding difficulties, suggestive of left frontal/MCA TIA, also had a history of left retinal ischemic event  Transient ischemic event, involving left MCA  Keep daily aspirin   Continue modify vascular risk factors, moderate exercise Frequent headaches, with some migraine features  Start preventive medication propranolol LA 80 mg every day, she will check with her GI physician, stopping metoprolol  Maxalt 5 mg as needed Excessive daytime sleepiness  ESS was 12  Possible obstructive sleep apnea  Marcial Pacas, M.D. Ph.D.  [facility] [place], [place] [address] Phone: [phone] Fax:      [phone]

## 2015-04-04 DIAGNOSIS — N200 Calculus of kidney: Secondary | ICD-10-CM | POA: Diagnosis not present

## 2015-04-04 DIAGNOSIS — N390 Urinary tract infection, site not specified: Secondary | ICD-10-CM | POA: Diagnosis not present

## 2015-04-15 DIAGNOSIS — Z944 Liver transplant status: Secondary | ICD-10-CM | POA: Diagnosis not present

## 2015-04-15 DIAGNOSIS — Z79899 Other long term (current) drug therapy: Secondary | ICD-10-CM | POA: Diagnosis not present

## 2015-04-17 DIAGNOSIS — F331 Major depressive disorder, recurrent, moderate: Secondary | ICD-10-CM | POA: Diagnosis not present

## 2015-04-25 ENCOUNTER — Ambulatory Visit (INDEPENDENT_AMBULATORY_CARE_PROVIDER_SITE_OTHER): Payer: Medicare Other | Admitting: Neurology

## 2015-04-25 ENCOUNTER — Encounter: Payer: Self-pay | Admitting: Neurology

## 2015-04-25 VITALS — BP 142/88 | HR 76 | Resp 16 | Ht 62.5 in | Wt 167.0 lb

## 2015-04-25 DIAGNOSIS — R351 Nocturia: Secondary | ICD-10-CM

## 2015-04-25 DIAGNOSIS — E663 Overweight: Secondary | ICD-10-CM

## 2015-04-25 DIAGNOSIS — R51 Headache: Secondary | ICD-10-CM

## 2015-04-25 DIAGNOSIS — Z944 Liver transplant status: Secondary | ICD-10-CM | POA: Diagnosis not present

## 2015-04-25 DIAGNOSIS — R519 Headache, unspecified: Secondary | ICD-10-CM

## 2015-04-25 DIAGNOSIS — G4719 Other hypersomnia: Secondary | ICD-10-CM | POA: Diagnosis not present

## 2015-04-25 NOTE — Progress Notes (Signed)
Subjective:    Patient ID: Candace Hill is a 69 y.o. female.  HPI     Star Age, MD, PhD [facility] [address] [place], [place] [address]  Dear Candace Hill,   I saw your patient, Candace Hill, upon your kind request in my clinic today for initial consultation of her sleep disorder, in particular, concern for underlying obstructive sleep apnea. The patient is unaccompanied today. As you know, Candace Hill is a 69 year old right-handed woman with an underlying complex medical history of hypertension, depression, kidney stone, central retinal vein occlusion, TIA, hepatitis C, acid reflux disease, asthma, status post cholecystectomy, liver transplant, cataract surgeries, tonsillectomy, and overweight state, who excessive daytime somnolence. Her Epworth sleepiness score is 15 out of 24 today. Her fatigue score is 50/63.  I reviewed your office note from 04/01/2015. She feels too sleepy, since she had the TIAs since earlier this year. She reports occasional morning HAs, some nocturia once per night on average. She denies RLS symptoms, and used to have sleep talking. She is twice divorced, and has a grown son in [place] and a daughter locally.  She is adopted and her biological mother died at 52 with AD.  She states that she was always a good sleeper until she had her TIAs. Sleep probably also got worse around her liver transplant time in 2009. She is not sure if she snores. She lives alone. She does have additional stressors. She watches TV in bed but turns it off before falling asleep. She goes to bed at variable times, somewhere between 11:30 and 2 AM at times. Her rise time also varies. Usually she is out of bed by 10 AM. Does not drink any alcohol or caffeine and does not smoke cigarettes. She is retired.  Her Past Medical History Is Significant For: Past Medical History  Diagnosis Date  . History of renal stone   . Hepatitis C   . Hernia, femoral      left  . Acid reflux   . 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 Broken rib     x2  . Broken ankle     right  . Abnormal uterine bleeding   . Blood transfusion without reported diagnosis     with first pregnancy  . History of shingles   . Asthma   . Cancer ([place])     liver (in the one that was removed)  . Tailbone injury     broken x2  . Hypertension   . Depression   . Renal stone 11/2012  . Central retinal vein occlusion   . TIA (transient ischemic attack)     Her Past Surgical History Is Significant For: Past Surgical History  Procedure Laterality Date  . Cholecystectomy    . Tranplant  02/17/08    liver  . Cataract extraction      bilateral  . Tubal ligation    . Nasal sinus surgery      x3  . Tonsillectomy  age 69  . Femoral hernia repair      left leg    Her Family History Is Significant For: Family History  Problem Relation Age of Onset  . Adopted: Yes  . Stroke Mother   . Alcoholism Father   . Alzheimer's disease Mother     Her Social History Is Significant For: Social History   Social History  . Marital Status: Divorced  Spouse Name: N/A  . Number of Children: 2  . Years of Education: 14   Occupational History  . Disabled    Social History Main Topics  . Smoking status: Never Smoker   . Smokeless tobacco: Never Used  . Alcohol Use: No  . Drug Use: No  . Sexual Activity: No     Comment: 14 years ago   Other Topics Concern  . Not on file   Social History Narrative   Lives at alone alone.   Right-handed.   No caffeine use.    Her Allergies Are:  Allergies  Allergen Reactions  . Codeine Nausea Only  . Lidocaine     Heart palp   . Penicillins     Pt had transplant   . Phenergan [Promethazine]     Scary mind things, being tortured while she was out.  . Sulfa Antibiotics Itching  . Tamsulosin Itching    Contains sulfa   :   Her Current Medications Are:   Outpatient Encounter Prescriptions as of 04/25/2015  Medication Sig  . acetaminophen (TYLENOL) 500 MG tablet Take 500 mg by mouth every 6 (six) hours as needed for pain.  Marland Kitchen aspirin 81 MG tablet Take 81 mg by mouth daily.  . brimonidine (ALPHAGAN P) 0.1 % SOLN Place 1 drop into both eyes 2 (two) times daily.  . cholecalciferol (VITAMIN D) 1000 UNITS tablet Take 1,000 Units by mouth daily.  . folic acid (FOLVITE) A999333 MCG tablet Take 400 mcg by mouth daily.  . metoprolol tartrate (LOPRESSOR) 25 MG tablet Take 25 mg by mouth 2 (two) times daily.   . potassium citrate (UROCIT-K) 10 MEQ (1080 MG) SR tablet Take 10 mEq by mouth 2 (two) times daily.   . propranolol ER (INDERAL LA) 80 MG 24 hr capsule Take 1 capsule (80 mg total) by mouth daily.  Marland Kitchen pyridOXINE (VITAMIN B-6) 100 MG tablet Take 100 mg by mouth daily.  . rizatriptan (MAXALT-MLT) 5 MG disintegrating tablet Take 1 tablet (5 mg total) by mouth as needed. May repeat in 2 hours if needed  . tacrolimus (PROGRAF) 0.5 MG capsule Take 1.5 mg by mouth 2 (two) times daily.   Marland Kitchen venlafaxine XR (EFFEXOR-XR) 150 MG 24 hr capsule Take by mouth. Take 150mg  along with 75mg  at bedtime.  Marland Kitchen venlafaxine XR (EFFEXOR-XR) 75 MG 24 hr capsule Take by mouth. Take 150mg , along with 75mg  at bedtime.  . Vitamin D, Ergocalciferol, (DRISDOL) 50000 UNITS CAPS capsule TAKE 1 C PO ONCE WEEKLY   No facility-administered encounter medications on file as of 04/25/2015.  :  Review of Systems:  Out of a complete 14 point review of systems, all are reviewed and negative with the exception of these symptoms as listed below:    Review of Systems  Neurological:       H/O TIA, H/O Sinus surgery, wakes up feeling tired, daytime tiredness, falls asleep easily when sitting still.   Epworth Sleepiness Scale 0= would never doze 1= slight chance of dozing 2= moderate chance of dozing 3= high chance of dozing  Sitting and reading:3 Watching TV:2 Sitting inactive in a public  place (ex. Theater or meeting):2 As a passenger in a car for an hour without a break:2 Lying down to rest in the afternoon:3 Sitting and talking to someone:0 Sitting quietly after lunch (no alcohol):2 In a car, while stopped in traffic:1 Total:15  Objective:  Neurologic Exam  Physical Exam Physical Examination:   Filed Vitals:   04/25/15 1435  BP: 142/88  Pulse: 76  Resp: 16   General Examination: The patient is a very pleasant 69 y.o. female in no acute distress. She appears well-developed and well-nourished and well groomed.   HEENT: Normocephalic, atraumatic, pupils are equal, round and reactive to light and accommodation. Funduscopic exam is normal with sharp disc margins noted. Extraocular tracking is good without limitation to gaze excursion or nystagmus noted. Normal smooth pursuit is noted. Hearing is grossly intact. Tympanic membranes are clear bilaterally. Face is symmetric with normal facial animation and normal facial sensation. Speech is clear with no dysarthria noted. There is no hypophonia. There is no lip, neck/head, jaw or voice tremor. Neck is supple with full range of passive and active motion. There are no carotid bruits on auscultation. Oropharynx exam reveals: mild mouth dryness, good dental hygiene and mild airway crowding, due to redundant soft palate. Mallampati is class I. Tongue protrudes centrally and palate elevates symmetrically. Tonsils are absent. Neck size is 14.75 inches. She has a Mild overbite. Nasal inspection reveals no significant nasal mucosal bogginess or redness and no septal deviation.   Chest: Clear to auscultation without wheezing, rhonchi or crackles noted.  Heart: S1+S2+0, regular and normal without murmurs, rubs or gallops noted.   Abdomen: Soft, non-tender and non-distended but mildly protruberent, with normal bowel sounds appreciated on auscultation.  Extremities: There is no pitting edema in the distal lower extremities bilaterally.  Pedal pulses are intact.  Skin: Warm and dry without trophic changes noted. There are no varicose veins.  Musculoskeletal: exam reveals no obvious joint deformities, tenderness or joint swelling or erythema.   Neurologically:  Mental status: The patient is awake, alert and oriented in all 4 spheres. Her immediate and remote memory, attention, language skills and fund of knowledge are appropriate. There is no evidence of aphasia, agnosia, apraxia or anomia. Speech is clear with normal prosody and enunciation. Thought process is linear. Mood is normal and affect is normal.  Cranial nerves II - XII are as described above under HEENT exam. In addition: shoulder shrug is normal with equal shoulder height noted. Motor exam: Normal bulk, strength and tone is noted. There is no drift, tremor or rebound. Romberg is negative. Reflexes are 2+ throughout. Babinski: Toes are flexor bilaterally. Fine motor skills and coordination: intact with normal finger taps, normal hand movements, normal rapid alternating patting, normal foot taps and normal foot agility.  Cerebellar testing: No dysmetria or intention tremor on finger to nose testing. Heel to shin is unremarkable bilaterally. There is no truncal or gait ataxia.  Sensory exam: intact to light touch, pinprick, vibration, temperature sense in the upper and lower extremities.  Gait, station and balance: She stands easily. No veering to one side is noted. No leaning to one side is noted. Posture is age-appropriate and stance is narrow based. Gait shows normal stride length and normal pace. No problems turning are noted. She turns en bloc. Tandem walk is unremarkable. Intact toe and heel stance is noted.               Assessment and Plan:  In summary, Naara Hammen is a very pleasant 69 y.o.-year old female with an underlying complex medical history of hypertension, depression, kidney stone, central retinal vein occlusion, TIA, hepatitis C, acid reflux disease,  asthma, status post cholecystectomy, liver transplant, cataract surgeries, tonsillectomy, and overweight state, who reports excessive daytime somnolence. Her history and physical exam are somewhat concerning for obstructive sleep apnea (OSA).  I had a long chat  with the patient about my findings and the diagnosis of OSA, its prognosis and treatment options. We talked about medical treatments, surgical interventions and non-pharmacological approaches. I explained in particular the risks and ramifications of untreated moderate to severe OSA, especially with respect to developing cardiovascular disease down the Road, including congestive heart failure, difficult to treat hypertension, cardiac arrhythmias, or stroke. Even type 2 diabetes has, in part, been linked to untreated OSA. Symptoms of untreated OSA include daytime sleepiness, memory problems, mood irritability and mood disorder such as depression and anxiety, lack of energy, as well as recurrent headaches, especially morning headaches. We talked about trying to maintain a healthy lifestyle in general, as well as the importance of weight control. I encouraged the patient to eat healthy, exercise daily and keep well hydrated, to keep a scheduled bedtime and wake time routine, to not skip any meals and eat healthy snacks in between meals. I advised the patient not to drive when feeling sleepy. I recommended the following at this time: sleep study with potential positive airway pressure titration. (We will score hypopneas at 4% and split the sleep study into diagnostic and treatment portion, if the estimated. 2 hour AHI is >15/h).   I explained the sleep test procedure to the patient and also outlined possible surgical and non-surgical treatment options of OSA, including the use of a custom-made dental device (which would require a referral to a specialist dentist or oral surgeon), upper airway surgical options, such as pillar implants, radiofrequency surgery,  tongue base surgery, and UPPP (which would involve a referral to an ENT surgeon). Rarely, jaw surgery such as mandibular advancement may be considered.  I also explained the CPAP treatment option to the patient, who indicated that she would be willing to try CPAP if the need arises. I explained the importance of being compliant with PAP treatment, not only for insurance purposes but primarily to improve Her symptoms, and for the patient's long term health benefit, including to reduce Her cardiovascular risks. I answered all her questions today and the patient was in agreement. I would like to see her back after the sleep study is completed and encouraged her to call with any interim questions, concerns, problems or updates.   Thank you very much for allowing me to participate in the care of this nice patient. If I can be of any further assistance to you please do not hesitate to talk to me.  Sincerely,   Star Age, MD, PhD

## 2015-04-25 NOTE — Patient Instructions (Addendum)
Based on your symptoms and your exam I believe you could be at risk for obstructive sleep apnea or OSA, and I think we should proceed with a sleep study to determine whether you do or do not have OSA and how severe it is. If you have more than mild OSA, I want you to consider treatment with CPAP. Please remember, the risks and ramifications of moderate to severe obstructive sleep apnea or OSA are: Cardiovascular disease, including congestive heart failure, stroke, difficult to control hypertension, arrhythmias, and even type 2 diabetes has been linked to untreated OSA. Sleep apnea causes disruption of sleep and sleep deprivation in most cases, which, in turn, can cause recurrent headaches, problems with memory, mood, concentration, focus, and vigilance. Most people with untreated sleep apnea report excessive daytime sleepiness, which can affect their ability to drive. Please do not drive if you feel sleepy.   I will likely see you back after your sleep study to go over the test results and where to go from there. We will call you after your sleep study to advise about the results (most likely, you will hear from Beverlee Nims, my nurse) and to set up an appointment at the time, as necessary.    Our sleep lab administrative assistant, Arrie Aran will meet with you or call you to schedule your sleep study. If you don't hear back from her by next week please feel free to call her at [phone]. This is her direct line and please leave a message with your phone number to call back if you get the voicemail box. She will call back as soon as possible.

## 2015-05-03 DIAGNOSIS — M25539 Pain in unspecified wrist: Secondary | ICD-10-CM | POA: Diagnosis not present

## 2015-05-03 DIAGNOSIS — Z944 Liver transplant status: Secondary | ICD-10-CM | POA: Diagnosis not present

## 2015-05-03 DIAGNOSIS — R52 Pain, unspecified: Secondary | ICD-10-CM | POA: Diagnosis not present

## 2015-05-03 DIAGNOSIS — J069 Acute upper respiratory infection, unspecified: Secondary | ICD-10-CM | POA: Diagnosis not present

## 2015-05-13 DIAGNOSIS — H401130 Primary open-angle glaucoma, bilateral, stage unspecified: Secondary | ICD-10-CM | POA: Diagnosis not present

## 2015-05-13 DIAGNOSIS — Z961 Presence of intraocular lens: Secondary | ICD-10-CM | POA: Diagnosis not present

## 2015-05-13 DIAGNOSIS — H35352 Cystoid macular degeneration, left eye: Secondary | ICD-10-CM | POA: Diagnosis not present

## 2015-05-23 DIAGNOSIS — F330 Major depressive disorder, recurrent, mild: Secondary | ICD-10-CM | POA: Diagnosis not present

## 2015-05-29 DIAGNOSIS — Z944 Liver transplant status: Secondary | ICD-10-CM | POA: Diagnosis not present

## 2015-05-29 DIAGNOSIS — Z5181 Encounter for therapeutic drug level monitoring: Secondary | ICD-10-CM | POA: Diagnosis not present

## 2015-06-13 DIAGNOSIS — Z23 Encounter for immunization: Secondary | ICD-10-CM | POA: Diagnosis not present

## 2015-06-26 ENCOUNTER — Ambulatory Visit (INDEPENDENT_AMBULATORY_CARE_PROVIDER_SITE_OTHER): Payer: Medicare Other | Admitting: Neurology

## 2015-06-26 DIAGNOSIS — G4720 Circadian rhythm sleep disorder, unspecified type: Secondary | ICD-10-CM

## 2015-06-26 DIAGNOSIS — G4710 Hypersomnia, unspecified: Secondary | ICD-10-CM

## 2015-06-26 DIAGNOSIS — G4761 Periodic limb movement disorder: Secondary | ICD-10-CM

## 2015-06-26 DIAGNOSIS — G479 Sleep disorder, unspecified: Secondary | ICD-10-CM

## 2015-06-27 ENCOUNTER — Telehealth: Payer: Self-pay | Admitting: Neurology

## 2015-06-27 DIAGNOSIS — H34812 Central retinal vein occlusion, left eye, with macular edema: Secondary | ICD-10-CM | POA: Diagnosis not present

## 2015-06-27 DIAGNOSIS — H3581 Retinal edema: Secondary | ICD-10-CM | POA: Diagnosis not present

## 2015-06-27 DIAGNOSIS — Z961 Presence of intraocular lens: Secondary | ICD-10-CM | POA: Diagnosis not present

## 2015-06-27 NOTE — Sleep Study (Signed)
Please see the scanned sleep study interpretation located in the Procedure tab within the Chart Review section. 

## 2015-06-27 NOTE — Telephone Encounter (Signed)
I spoke to patient and she just had sleep study and has appt with Dr. Krista Blue 1/17. I let her know that there may be a chance that the study will be read by then. Patient is unsure of what will be discussed at appt on 1/17. I told the patient that I will talk to [facility] about keeping the appt? Or pushing it out? Patient is aware that we will call back.

## 2015-06-27 NOTE — Telephone Encounter (Signed)
Pt called and states she just had her sleep study today and she wants to know if her appt on 1/17 will give enough time for her study to be read. Please call and advise

## 2015-06-27 NOTE — Telephone Encounter (Signed)
I called the patient and left a voicemail. Dr. Krista Blue wanted to see her back in about 3 months, which would be now. This follow up would be for her headaches. She will continue to follow up with Dr. Rexene Alberts for sleep, but that would be a separate visit. Please let patient know this when she calls.

## 2015-06-28 ENCOUNTER — Telehealth: Payer: Self-pay | Admitting: Neurology

## 2015-06-28 DIAGNOSIS — E663 Overweight: Secondary | ICD-10-CM | POA: Insufficient documentation

## 2015-06-28 HISTORY — DX: Overweight: E66.3

## 2015-06-28 NOTE — Telephone Encounter (Signed)
Patient referred by Dr. Krista Blue, seen by me on 04/25/15, diagnostic PSG on 06/26/15.   Please call and notify the patient that the recent sleep study did not show any significant obstructive sleep apnea. She had bouts of leg twitching and did not achieve any deep sleep nor REM sleep. As I recall, she has no RLS symptoms. Please inform patient that I would like to go over the details of the study during a follow up appointment. Arrange a followup appointment. Also, route or fax report to PCP and referring MD, if other than PCP.  Once you have spoken to patient, you can close this encounter.   Thanks,  Star Age, MD, PhD [facility])

## 2015-07-01 NOTE — Telephone Encounter (Signed)
I spoke to patient and she is aware of results and recommendations. She sees Dr. Krista Blue tomorrow for other health issues. She asked to make appt after seeing Dr. Krista Blue.

## 2015-07-02 ENCOUNTER — Ambulatory Visit (INDEPENDENT_AMBULATORY_CARE_PROVIDER_SITE_OTHER): Payer: Medicare Other | Admitting: Neurology

## 2015-07-02 ENCOUNTER — Encounter: Payer: Self-pay | Admitting: Neurology

## 2015-07-02 VITALS — BP 127/80 | HR 76 | Ht 62.5 in | Wt 164.0 lb

## 2015-07-02 DIAGNOSIS — G43009 Migraine without aura, not intractable, without status migrainosus: Secondary | ICD-10-CM

## 2015-07-02 DIAGNOSIS — G43909 Migraine, unspecified, not intractable, without status migrainosus: Secondary | ICD-10-CM

## 2015-07-02 HISTORY — DX: Migraine, unspecified, not intractable, without status migrainosus: G43.909

## 2015-07-02 NOTE — Progress Notes (Signed)
Chief Complaint  Patient presents with  . Excessive Daytime Sleepiness    She would like to discuss the results of her sleep study.      PATIENT: Candace Hill DOB: 1945-12-27  Chief Complaint  Patient presents with  . Excessive Daytime Sleepiness    She would like to discuss the results of her sleep study.    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  Laboratory evaluation in July showed normal CMP, normal liver functional tests, vitamin D was normal 41.5, mild elevated LDL 133, cholesterol 193  In September 08 2014, after finished dinner with her brother, in the middle of the conversation, she developed sudden onset word finding difficulty, she was confused where she was, who she was with, she could not move, could not talk,nsymptoms last for 5-10 minutes,  few hours later she was able to drive back home,  But since the event, patient complains of word finding difficulty, lack of energy, fatigue  UPDATE January 30 2015: Today she reported one episode of sudden left visual loss in 2009, shortly after liver transplant, which has been persistent since then, today's left vision is 20/400, she was diagnosed with left retinal ischemia event,,  We have reviewed MRI of the brain without contrast in August 2016, periventricular small vessel disease, 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 She has a history of left retinal artery occlusion, with poor left vision  I reviewed laboratory evaluation since August 2016, normal CBC, CMP, creatinine was 1.01,   She also complains of excessive daytime sleepiness, ESS score is 12  UPDATE Jul 02 2015: Her headache overall has much improved, maxalt prn was helpful, took away her headaches in 30 minutes. She still complains of daytime fatigue and sleepiness.  She still has blurry vision, she had difficulty falling into sleep during sleep test.   I went over to sleep test with her, no significant desaturation, lack of supine and REM sleep, sleep study showed sleep fragmentation, abnormal sleep stage percentages, severe periodic limb movement was observed with mild arousal, which might account for her excessive daytime sleepiness  REVIEW OF SYSTEMS: Full 14 system review of systems performed and notable only for memory loss, joint pain, low back pain, loss of vision, light sensitivity, activity change,  ALLERGIES: Allergies  Allergen Reactions  . Codeine Nausea Only  . Lidocaine     Heart palp   . Penicillins     Pt had transplant   . Phenergan [Promethazine]     Scary mind things, being tortured while she was out.  . Sulfa Antibiotics  Itching  . Tamsulosin Itching    Contains sulfa     HOME MEDICATIONS: Current Outpatient Prescriptions  Medication Sig Dispense Refill  . acetaminophen (TYLENOL) 500 MG tablet Take 500 mg by mouth every 6 (six) hours as needed for pain.    Marland Kitchen aspirin 81 MG tablet Take 81 mg by mouth daily.    . brimonidine  (ALPHAGAN P) 0.1 % SOLN Place 1 drop into both eyes 2 (two) times daily.    . cholecalciferol (VITAMIN D) 1000 UNITS tablet Take 1,000 Units by mouth daily.    . folic acid (FOLVITE) A999333 MCG tablet Take 400 mcg by mouth daily.    . metoprolol tartrate (LOPRESSOR) 25 MG tablet Take 25 mg by mouth 2 (two) times daily.     . potassium citrate (UROCIT-K) 10 MEQ (1080 MG) SR tablet Take 10 mEq by mouth 2 (two) times daily.     . propranolol ER (INDERAL LA) 80 MG 24 hr capsule Take 1 capsule (80 mg total) by mouth daily. 30 capsule 11  . pyridOXINE (VITAMIN B-6) 100 MG tablet Take 100 mg by mouth daily.    . rizatriptan (MAXALT-MLT) 5 MG disintegrating tablet Take 1 tablet (5 mg total) by mouth as needed. May repeat in 2 hours if needed 15 tablet 6  . tacrolimus (PROGRAF) 0.5 MG capsule Take 1.5 mg by mouth 2 (two) times daily.     Marland Kitchen venlafaxine XR (EFFEXOR-XR) 150 MG 24 hr capsule Take by mouth. Take 150mg  along with 75mg  at bedtime.    Marland Kitchen venlafaxine XR (EFFEXOR-XR) 75 MG 24 hr capsule Take by mouth. Take 150mg , along with 75mg  at bedtime.    . Vitamin D, Ergocalciferol, (DRISDOL) 50000 UNITS CAPS capsule TAKE 1 C PO ONCE WEEKLY  3   No current facility-administered medications for this visit.    PAST MEDICAL HISTORY: Past Medical History  Diagnosis Date  . History of renal stone   . Hepatitis C   . Hernia, femoral     left  . Acid reflux   . 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 Broken rib     x2  . Broken ankle     right  . Abnormal uterine bleeding   . Blood transfusion without reported diagnosis     with first pregnancy  . History of shingles   . Asthma   . Cancer ([place])     liver (in the one that was removed)  . Tailbone injury     broken x2  . Hypertension   . Depression   . Renal stone 11/2012  . Central retinal vein occlusion   . TIA (transient ischemic attack)     PAST SURGICAL HISTORY: Past Surgical  History  Procedure Laterality Date  . Cholecystectomy    . Tranplant  02/17/08    liver  . Cataract extraction      bilateral  . Tubal ligation    . Nasal sinus surgery      x3  . Tonsillectomy  age 65  . Femoral hernia repair      left leg    FAMILY HISTORY: Family History  Problem Relation Age of Onset  . Adopted: Yes  . Stroke Mother   . Alcoholism Father   . Alzheimer's disease Mother     SOCIAL HISTORY:  Social History   Social History  . Marital Status: Divorced    Spouse Name: N/A  .  Number of Children: 2  . Years of Education: 14   Occupational History  . Disabled    Social History Main Topics  . Smoking status: Never Smoker   . Smokeless tobacco: Never Used  . Alcohol Use: No  . Drug Use: No  . Sexual Activity: No     Comment: 14 years ago   Other Topics Concern  . Not on file   Social History Narrative   Lives at alone alone.   Right-handed.   No caffeine use.     PHYSICAL EXAM   Filed Vitals:   07/02/15 1412  BP: 127/80  Pulse: 76  Height: 5' 2.5" (1.588 m)  Weight: 164 lb (74.39 kg)    Not recorded      Body mass index is 29.5 kg/(m^2).  PHYSICAL EXAMNIATION:  Gen: NAD, conversant, well nourised, obese, well groomed                     Cardiovascular: Regular rate rhythm, no peripheral edema, warm, nontender. Eyes: Conjunctivae clear without exudates or hemorrhage Neck: Supple, no carotid bruise. Pulmonary: Clear to auscultation bilaterally   NEUROLOGICAL EXAM:  MENTAL STATUS: Speech:    Speech is normal; fluent and spontaneous with normal comprehension.  Cognition:    The patient is oriented to person, place, and time;     recent and remote memory intact;     language fluent;     normal attention, concentration,     fund of knowledge.  CRANIAL NERVES: CN II: Visual fields are full to confrontation. Pupils were equal round reactive to light, funduscopy examination showed sharp discs bilaterally CN III, IV, VI:  extraocular movement are normal. No ptosis. CN V: Facial sensation is intact to pinprick in all 3 divisions bilaterally. Corneal responses are intact.  CN VII: Face is symmetric with normal eye closure and smile. CN VIII: Hearing is normal to rubbing fingers CN IX, X: Palate elevates symmetrically. Phonation is normal. CN XI: Head turning and shoulder shrug are intact CN XII: Tongue is midline with normal movements and no atrophy.  MOTOR: There is no pronator drift of out-stretched arms. Muscle bulk and tone are normal. Muscle strength is normal.  REFLEXES: Reflexes are 2+ and symmetric at the biceps, triceps, knees, and ankles. Plantar responses are flexor.  SENSORY: Light touch, pinprick, position sense, and vibration sense are intact in fingers and toes.  COORDINATION: Rapid alternating movements and fine finger movements are intact. There is no dysmetria on finger-to-nose and heel-knee-shin. There are no abnormal or extraneous movements.   GAIT/STANCE: Posture is normal. Gait is steady with normal steps, base, arm swing, and turning. Heel and toe walking are normal. Tandem gait is normal.  Romberg is absent.  DIAGNOSTIC DATA (LABS, IMAGING, TESTING) - I reviewed patient records, labs, notes, testing and imaging myself where available.  Lab Results  Component Value Date   WBC 5.7 12/03/2006   HGB 13.1 12/03/2006   HCT 38.6 12/03/2006   MCV 91.7 12/03/2006   PLT 81* 12/03/2006      Component Value Date/Time   NA 141 11/03/2006 1332   K 4.0 11/03/2006 1332   CL 108 11/03/2006 1332   CO2 25 11/03/2006 1332   GLUCOSE 90 11/03/2006 1332   BUN 14 11/03/2006 1332   CREATININE 0.58 11/03/2006 1332   CALCIUM 8.7 11/03/2006 1332   PROT 7.0 11/03/2006 1332   ALBUMIN 4.0 11/03/2006 1332   AST 162* 11/03/2006 1332   ALT 121* 11/03/2006  1332   ALKPHOS 84 11/03/2006 1332   BILITOT 1.3* 11/03/2006 1332   ASSESSMENT AND PLAN  Darnella Zinman is a 70 y.o. female with history of  hepatitis C, status post liver transplant, received hepatitis C antivirus treatment, now is cured, she has a vascular risk factor of hyperlipidemia, hypertension, presented with acute onset of word finding difficulties, suggestive of left frontal/MCA TIA, also had a history of left retinal ischemic event  Transient ischemic event, involving left MCA  Keep daily aspirin   Continue modify vascular risk factors, moderate exercise  Frequent headaches, with some migraine features  Continue preventive medication propranolol LA 80 mg every day, she will check with her GI physician, stopping metoprolol  Maxalt 5 mg as needed Excessive daytime sleepiness  Periodic limb movement disorder  She will see sleep specialist Dr. Rexene Alberts at next visit.  Marcial Pacas, M.D. Ph.D.  [facility] [place], [place] [address] Phone: [phone] Fax:      [phone]

## 2015-07-09 ENCOUNTER — Telehealth: Payer: Self-pay

## 2015-07-09 ENCOUNTER — Institutional Professional Consult (permissible substitution): Payer: Medicare Other | Admitting: Neurology

## 2015-07-09 NOTE — Telephone Encounter (Signed)
LM for patient to call back and reschedule appt. She would like to discuss sleep study with Dr. Rexene Alberts, please reschedule with Dr. Rexene Alberts.

## 2015-07-12 DIAGNOSIS — Z944 Liver transplant status: Secondary | ICD-10-CM | POA: Diagnosis not present

## 2015-07-12 DIAGNOSIS — Z5181 Encounter for therapeutic drug level monitoring: Secondary | ICD-10-CM | POA: Diagnosis not present

## 2015-07-17 ENCOUNTER — Telehealth: Payer: Self-pay | Admitting: Neurology

## 2015-07-17 DIAGNOSIS — F418 Other specified anxiety disorders: Secondary | ICD-10-CM

## 2015-07-17 NOTE — Telephone Encounter (Signed)
Pt called said her provider at [facility] (transplant) wants Dr Krista Blue to recommend a good psyhchiartist for her to go see bc of 3 different issues that a psych would need to test her. She said she's been on effexor for 10 yrs for depression but she has never been tested for depression. She can not stay focused, can't seem to get herself together to get tasks done, and having crying spells over her situation. She said she is sleeping a lot during the day if she doesn't have something to occupy her time, she sleeps well at night. PCP will not refill effexor as she said 225 mg is the highest dose. Please call after 4pm today and she will be home all day tomorrow.

## 2015-07-18 NOTE — Addendum Note (Signed)
Addended by: Marcial Pacas on: 07/18/2015 02:26 PM   Modules accepted: Orders

## 2015-07-18 NOTE — Telephone Encounter (Signed)
Please let patient know, I have referred her to [facility], she may go to website to find the preferred provider

## 2015-07-18 NOTE — Telephone Encounter (Signed)
[facility] [email or web address] [facility] [address] [place], [place] [address]. Phone: [phone]. Fax: [phone] .Marland KitchenMarland Kitchen

## 2015-07-22 NOTE — Telephone Encounter (Signed)
Called and spoke to patient she relayed she did not want to see at  [facility] and she was going back and forth . Patient relayed she might want to see Dr. Caprice Beaver I relayed to her that Dr. Caprice Beaver office requires an up front fee  but I could have Dr.McKinney's office to call her. Patient then relayed to me to let her think about over night and to call her at 12:00 noon 07/22/2014. I relayed to patient if she felt in danger to herself or any one else please call 911 or go to the closet emergency room. Patient understood all details.

## 2015-07-23 NOTE — Telephone Encounter (Signed)
Pt called and would like to see Dr. Lynder Parents phone: [phone]. She was told pt information will be sent to his office and they will call to schedule an appt with her. She expressed her understanding.

## 2015-07-23 NOTE — Telephone Encounter (Signed)
Spoke to patient and relayed that she needed to call [facility] for scheduling patient understood and she has the number.

## 2015-07-24 NOTE — Telephone Encounter (Signed)
Called and spoke to patient she is going to call her insurance company to see who her insurance is in network with . Patient will call me back with details on Monday to see where she wants me to send referral.

## 2015-07-24 NOTE — Telephone Encounter (Addendum)
Pt called and has told that [facility] will not accept her insurance and are not accepting new pts right now. Can a new referral be done?Please call pt at [phone]

## 2015-07-30 ENCOUNTER — Telehealth: Payer: Self-pay | Admitting: Neurology

## 2015-07-30 NOTE — Telephone Encounter (Signed)
Called and spoke to patient she relayed to [facility] place to call. [place] [facility] [phone] fax [phone]. Patient is aware of details referral has been faxed.

## 2015-08-15 ENCOUNTER — Encounter: Payer: Self-pay | Admitting: Neurology

## 2015-08-15 ENCOUNTER — Ambulatory Visit (INDEPENDENT_AMBULATORY_CARE_PROVIDER_SITE_OTHER): Payer: Medicare Other | Admitting: Neurology

## 2015-08-15 VITALS — BP 136/78 | HR 78 | Resp 16 | Ht 62.5 in | Wt 165.0 lb

## 2015-08-15 DIAGNOSIS — F439 Reaction to severe stress, unspecified: Secondary | ICD-10-CM

## 2015-08-15 DIAGNOSIS — F418 Other specified anxiety disorders: Secondary | ICD-10-CM | POA: Diagnosis not present

## 2015-08-15 DIAGNOSIS — Z658 Other specified problems related to psychosocial circumstances: Secondary | ICD-10-CM

## 2015-08-15 DIAGNOSIS — G479 Sleep disorder, unspecified: Secondary | ICD-10-CM | POA: Diagnosis not present

## 2015-08-15 NOTE — Patient Instructions (Addendum)
Please remember to try to maintain good sleep hygiene, which means: Keep a regular sleep and wake schedule, try not to exercise or have a meal within 2 hours of your bedtime, try to keep your bedroom conducive for sleep, that is, cool and dark, without light distractors such as an illuminated alarm clock, and refrain from watching TV right before sleep or in the middle of the night and do not keep the TV or radio on during the night. Also, try not to use or play on electronic devices at bedtime, such as your cell phone, tablet PC or laptop. If you like to read at bedtime on an electronic device, try to dim the background light as much as possible. Do not eat in the middle of the night.   I think you will benefit from seeing your new psychiatrist.   Your sleep study did not show sleep apnea, but leg movements, but in the absence of restless legs symptoms and on high dose Effexor, this could be a medication effect.   I can see you back as needed. Follow up with Dr. Krista Blue as needed.

## 2015-08-15 NOTE — Progress Notes (Signed)
Subjective:    Patient ID: Candace Hill is a 70 y.o. female.  HPI     Interim history:   Candace Hill is a 70 year old right-handed woman with an underlying complex medical history of hypertension, depression, kidney stone, central retinal vein occlusion, TIA, hepatitis C, acid reflux disease, asthma, status post cholecystectomy, liver transplant, cataract surgeries, tonsillectomy, and overweight state, who presents for follow-up consultation after her recent sleep study. The patient is unaccompanied today. I first met her on 04/25/2015 at the request of Dr. Krista Blue, at which time the patient reported excessive daytime somnolence. I invited her back for a sleep study. She had a baseline sleep study on 06/26/2015. I went over her test results with her in detail today. Sleep efficiency was markedly reduced at only 38.7% with a latency to sleep markedly prolonged at 121 minutes, wake after sleep onset was elevated at 151.5 minutes with moderate sleep fragmentation noted. She had an elevated arousal index, secondary to spontaneous arousals. She had increased percentages of stage I and stage II sleep, and absence of slow-wave sleep and absence of REM sleep. 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 She had no significant EKG or EEG changes. She had minimal intermittent snoring. AHI was 0 per hour. Average oxygen saturation was 96%, nadir was 94%.   Today, 08/15/2015: She reports she had retinal vessel occlusion on the left. She seeing a specialist at [facility] in [facility], she started having L eye vision loss some 3-4 years ago. She has a lot of other stressors, has not unpacked from a recent move, lost half sister in December, residual depression and anxiety, despite being on high-dose Effexor, 225 mg daily. She has been on this for years. Her primary care physician may be retiring soon she states. She has an appointment with a new psychiatrist coming up later this  month. She is looking forward to seeing someone for her mood disorder and stress and depression. She denies RLS symptoms, she says she was moving during the sleep study because she was uncomfortable. She typically also doesn't sleep on her back.  Previously:   04/25/2015: She reports excessive daytime somnolence. Her Epworth sleepiness score is 15 out of 24 today. Her fatigue score is 50/63.  I reviewed your office note from 04/01/2015. She feels too sleepy, since she had the TIAs since earlier this year. She reports occasional morning HAs, some nocturia once per night on average. She denies RLS symptoms, and used to have sleep talking. She is twice divorced, and has a grown son in [place] and a daughter locally.   She is adopted and her biological mother died at 32 with AD.   She states that she was always a good sleeper until she had her TIAs. Sleep probably also got worse around her liver transplant time in 2009. She is not sure if she snores. She lives alone. She does have additional stressors. She watches TV in bed but turns it off before falling asleep. She goes to bed at variable times, somewhere between 11:30 and 2 AM at times. Her rise time also varies. Usually she is out of bed by 10 AM. Does not drink any alcohol or caffeine and does not smoke cigarettes. She is retired.  Her Past Medical History Is Significant For: Past Medical History  Diagnosis Date  . History of renal stone   . Hepatitis C   . Hernia, femoral     left  .  Acid reflux   . 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 Broken rib     x2  . Broken ankle     right  . Abnormal uterine bleeding   . Blood transfusion without reported diagnosis     with first pregnancy  . History of shingles   . Asthma   . 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 Tailbone injury     broken x2  . Hypertension   . Depression   . Renal stone 11/2012  . Central  retinal vein occlusion   . TIA (transient ischemic attack)    Her Past Surgical History Is Significant For: Past Surgical History  Procedure Laterality Date  . Cholecystectomy    . Tranplant  02/17/08    liver  . Cataract extraction      bilateral  . Tubal ligation    . Nasal sinus surgery      x3  . Tonsillectomy  age 62  . Femoral hernia repair      left leg  . Corneal transplant      Her Family History Is Significant For: Family History  Problem Relation Age of Onset  . Adopted: Yes  . Stroke Mother   . Alcoholism Father   . Alzheimer's disease Mother     Her Social History Is Significant For: Social History   Social History  . Marital Status: Divorced    Spouse Name: N/A  . Number of Children: 2  . Years of Education: 14   Occupational History  . Disabled    Social History Main Topics  . Smoking status: Never Smoker   . Smokeless tobacco: Never Used  . Alcohol Use: No  . Drug Use: No  . Sexual Activity: No     Comment: 14 years ago   Other Topics Concern  . None   Social History Narrative   Lives at alone alone.   Right-handed.   No caffeine use.    Her Allergies Are:  Allergies  Allergen Reactions  . Codeine Nausea Only  . Lidocaine     Heart palp   . Penicillins     Pt had transplant   . Phenergan [Promethazine]     Scary mind things, being tortured while she was out.  . Sulfa Antibiotics Itching  . Tamsulosin Itching    Contains sulfa   :   Her Current Medications Are:  Outpatient Encounter Prescriptions as of 08/15/2015  Medication Sig  . acetaminophen (TYLENOL) 500 MG tablet Take 500 mg by mouth every 6 (six) hours as needed for pain.  Marland Kitchen aspirin 81 MG tablet Take 81 mg by mouth daily.  . brimonidine (ALPHAGAN P) 0.1 % SOLN Place 1 drop into both eyes 2 (two) times daily.  . folic acid (FOLVITE) 505 MCG tablet Take 400 mcg by mouth daily.  . potassium citrate (UROCIT-K) 10 MEQ (1080 MG) SR tablet Take 10 mEq by mouth 2 (two) times  daily.   . propranolol ER (INDERAL LA) 80 MG 24 hr capsule Take 1 capsule (80 mg total) by mouth daily.  Marland Kitchen pyridOXINE (VITAMIN B-6) 100 MG tablet Take 100 mg by mouth daily.  . rizatriptan (MAXALT-MLT) 5 MG disintegrating tablet Take 1 tablet (5 mg total) by mouth as needed. May repeat in 2 hours if needed  . tacrolimus (PROGRAF) 0.5 MG capsule Take 1.5 mg by mouth  2 (two) times daily.   Marland Kitchen venlafaxine XR (EFFEXOR-XR) 150 MG 24 hr capsule Take by mouth. Take '150mg'$  along with '75mg'$  at bedtime.  Marland Kitchen venlafaxine XR (EFFEXOR-XR) 75 MG 24 hr capsule Take by mouth. Take '150mg'$ , along with '75mg'$  at bedtime.  . Vitamin D, Ergocalciferol, (DRISDOL) 50000 UNITS CAPS capsule TAKE 1 C PO ONCE WEEKLY  . [DISCONTINUED] cholecalciferol (VITAMIN D) 1000 UNITS tablet Take 1,000 Units by mouth daily.   No facility-administered encounter medications on file as of 08/15/2015.  :  Review of Systems:  Out of a complete 14 point review of systems, all are reviewed and negative with the exception of these symptoms as listed below:   Review of Systems  Eyes:       Patient just recently had retina transplant at [facility].   Neurological:       Patient reports that she sleeps a lot. If she sits still, she will fall asleep.     Objective:  Neurologic Exam  Physical Exam Physical Examination:   Filed Vitals:   08/15/15 1504  BP: 136/78  Pulse: 78  Resp: 16   General Examination: The patient is a very pleasant 70 y.o. female in no acute distress. She appears well-developed and well-nourished and well groomed.   HEENT: Normocephalic, atraumatic, pupils are equal, round and reactive to light and accommodation. She is somewhat sensitive to light, has vision loss on L. Extraocular tracking is good without limitation to gaze excursion or nystagmus noted. Normal smooth pursuit is noted. Hearing is grossly intact. Face is symmetric with normal facial animation and normal facial sensation. Speech is clear with no dysarthria noted.  There is no hypophonia. There is no lip, neck/head, jaw or voice tremor. Neck is supple with full range of passive and active motion. There are no carotid bruits on auscultation. Oropharynx exam reveals: mild mouth dryness, good dental hygiene and mild airway crowding, due to redundant soft palate. Mallampati is class I. Tongue protrudes centrally and palate elevates symmetrically. Tonsils are absent. She has a Mild overbite. Nasal inspection reveals no significant nasal mucosal bogginess or redness and no septal deviation.   Chest: Clear to auscultation without wheezing, rhonchi or crackles noted.  Heart: S1+S2+0, regular and normal without murmurs, rubs or gallops noted.   Abdomen: Soft, non-tender and non-distended but mildly protruberent, with normal bowel sounds appreciated on auscultation.  Extremities: There is no pitting edema in the distal lower extremities bilaterally. Pedal pulses are intact.  Skin: Warm and dry without trophic changes noted. There are no varicose veins.  Musculoskeletal: exam reveals no obvious joint deformities, tenderness or joint swelling or erythema.   Neurologically:  Mental status: The patient is awake, alert and oriented in all 4 spheres. Her immediate and remote memory, attention, language skills and fund of knowledge are appropriate. There is no evidence of aphasia, agnosia, apraxia or anomia. Speech is clear with normal prosody and enunciation. Thought process is linear. Mood is normal and affect is normal.  Cranial nerves II - XII are as described above under HEENT exam. In addition: shoulder shrug is normal with equal shoulder height noted. Motor exam: Normal bulk, strength and tone is noted. There is no drift, tremor or rebound. Romberg is negative. Reflexes are 1-2+ throughout. Fine motor skills and coordination: intact with normal finger taps, normal hand movements, normal rapid alternating patting, normal foot taps and normal foot agility.  Cerebellar  testing: No dysmetria or intention tremor on finger to nose testing. Heel to shin is  unremarkable bilaterally. There is no truncal or gait ataxia.  Sensory exam: intact to light touch in the upper and lower extremities.  Gait, station and balance: She stands easily. No veering to one side is noted. No leaning to one side is noted. Posture is age-appropriate and stance is narrow based. Gait shows normal stride length and normal pace. No problems turning are noted. She turns en bloc. Tandem walk is unremarkable.   Assessment and Plan:  In summary, Candace Hill is a very pleasant 70 year old female with an underlying complex medical history of hypertension, depression, kidney stone, central retinal vein occlusion, TIA, hepatitis C, acid reflux disease, asthma, status post cholecystectomy, liver transplant, cataract surgeries, recent  tonsillectomy, and overweight state, who presents for follow-up consultation after her sleep study in January 2017. She did not have good sleep efficiency and had severe sleep disruption with no slow-wave sleep and no REM sleep achieved. She has no evidence of sleep disordered breathing however, with the limitation that she did not have any REM sleep. She had PLMS with some arousals but denies any restless leg symptoms and this may be a medication-induced effect. She is advised about her sleep study results in detail today. Unfortunately, her suboptimally treated mood disorder probably has a lot of impact on her sleep disturbance. She is going to start seeing a new psychiatrist. She is hopeful that she may get some help there. She is endorsing a lot of stress, unresolved stressful events, what with also recently having lost her half sister. She had residual depression and anxiety despite being on high-dose Effexor. She has been on it for years. This was started by a psychiatrist when she was seeing someone in [place] as I understand that her primary care physician has been  maintaining this antidepressant for her. At this juncture, we talked about sleep hygiene, improving sleep environment. She is encouraged to try to stay well-hydrated, physically active, and stayed well-nourished. She is advised to keep her appointment with her psychiatrist and follow-up with Dr. Krista Blue as needed and as previously discussed. I will see her back as needed. I spent 25 minutes in total face-to-face time with the patient, more than 50% of which was spent in counseling and coordination of care, reviewing test results, reviewing medication and discussing or reviewing the diagnosis of sleep disturbance, the prognosis and treatment options.

## 2015-08-29 DIAGNOSIS — Z1231 Encounter for screening mammogram for malignant neoplasm of breast: Secondary | ICD-10-CM | POA: Diagnosis not present

## 2015-09-02 DIAGNOSIS — F331 Major depressive disorder, recurrent, moderate: Secondary | ICD-10-CM | POA: Diagnosis not present

## 2015-09-02 DIAGNOSIS — F411 Generalized anxiety disorder: Secondary | ICD-10-CM | POA: Diagnosis not present

## 2015-09-05 DIAGNOSIS — Z5181 Encounter for therapeutic drug level monitoring: Secondary | ICD-10-CM | POA: Diagnosis not present

## 2015-09-05 DIAGNOSIS — Z944 Liver transplant status: Secondary | ICD-10-CM | POA: Diagnosis not present

## 2015-09-05 DIAGNOSIS — E612 Magnesium deficiency: Secondary | ICD-10-CM | POA: Diagnosis not present

## 2015-09-25 DIAGNOSIS — F411 Generalized anxiety disorder: Secondary | ICD-10-CM | POA: Diagnosis not present

## 2015-09-25 DIAGNOSIS — F331 Major depressive disorder, recurrent, moderate: Secondary | ICD-10-CM | POA: Diagnosis not present

## 2015-09-26 DIAGNOSIS — H34812 Central retinal vein occlusion, left eye, with macular edema: Secondary | ICD-10-CM | POA: Diagnosis not present

## 2015-09-26 DIAGNOSIS — H3581 Retinal edema: Secondary | ICD-10-CM | POA: Diagnosis not present

## 2015-09-26 DIAGNOSIS — L908 Other atrophic disorders of skin: Secondary | ICD-10-CM | POA: Diagnosis not present

## 2015-09-26 DIAGNOSIS — Z961 Presence of intraocular lens: Secondary | ICD-10-CM | POA: Diagnosis not present

## 2015-10-02 DIAGNOSIS — M7062 Trochanteric bursitis, left hip: Secondary | ICD-10-CM | POA: Diagnosis not present

## 2015-10-07 DIAGNOSIS — M25552 Pain in left hip: Secondary | ICD-10-CM | POA: Diagnosis not present

## 2015-10-29 DIAGNOSIS — Z0000 Encounter for general adult medical examination without abnormal findings: Secondary | ICD-10-CM | POA: Diagnosis not present

## 2015-10-29 DIAGNOSIS — N200 Calculus of kidney: Secondary | ICD-10-CM | POA: Diagnosis not present

## 2015-11-06 DIAGNOSIS — Z5181 Encounter for therapeutic drug level monitoring: Secondary | ICD-10-CM | POA: Diagnosis not present

## 2015-11-06 DIAGNOSIS — Z944 Liver transplant status: Secondary | ICD-10-CM | POA: Diagnosis not present

## 2015-11-06 DIAGNOSIS — E612 Magnesium deficiency: Secondary | ICD-10-CM | POA: Diagnosis not present

## 2015-11-15 DIAGNOSIS — H35352 Cystoid macular degeneration, left eye: Secondary | ICD-10-CM | POA: Diagnosis not present

## 2015-11-15 DIAGNOSIS — H401130 Primary open-angle glaucoma, bilateral, stage unspecified: Secondary | ICD-10-CM | POA: Diagnosis not present

## 2015-11-15 DIAGNOSIS — Z961 Presence of intraocular lens: Secondary | ICD-10-CM | POA: Diagnosis not present

## 2015-11-21 ENCOUNTER — Encounter (HOSPITAL_COMMUNITY): Payer: Self-pay

## 2015-11-21 ENCOUNTER — Emergency Department (HOSPITAL_COMMUNITY): Payer: Medicare Other

## 2015-11-21 ENCOUNTER — Emergency Department (HOSPITAL_COMMUNITY)
Admission: EM | Admit: 2015-11-21 | Discharge: 2015-11-22 | Disposition: A | Discharge status: Home / Self Care | Payer: Medicare Other | Attending: Emergency Medicine | Admitting: Emergency Medicine

## 2015-11-21 DIAGNOSIS — K219 Gastro-esophageal reflux disease without esophagitis: Secondary | ICD-10-CM | POA: Diagnosis not present

## 2015-11-21 DIAGNOSIS — Z87442 Personal history of urinary calculi: Secondary | ICD-10-CM | POA: Insufficient documentation

## 2015-11-21 DIAGNOSIS — Z7982 Long term (current) use of aspirin: Secondary | ICD-10-CM | POA: Diagnosis not present

## 2015-11-21 DIAGNOSIS — F331 Major depressive disorder, recurrent, moderate: Secondary | ICD-10-CM | POA: Diagnosis not present

## 2015-11-21 DIAGNOSIS — Z880 Allergy status to penicillin: Secondary | ICD-10-CM | POA: Diagnosis not present

## 2015-11-21 DIAGNOSIS — Z8619 Personal history of other infectious and parasitic diseases: Secondary | ICD-10-CM | POA: Insufficient documentation

## 2015-11-21 DIAGNOSIS — M25562 Pain in left knee: Secondary | ICD-10-CM | POA: Diagnosis not present

## 2015-11-21 DIAGNOSIS — Z79899 Other long term (current) drug therapy: Secondary | ICD-10-CM | POA: Insufficient documentation

## 2015-11-21 DIAGNOSIS — M25462 Effusion, left knee: Secondary | ICD-10-CM

## 2015-11-21 DIAGNOSIS — J45909 Unspecified asthma, uncomplicated: Secondary | ICD-10-CM | POA: Diagnosis not present

## 2015-11-21 DIAGNOSIS — Z8673 Personal history of transient ischemic attack (TIA), and cerebral infarction without residual deficits: Secondary | ICD-10-CM | POA: Diagnosis not present

## 2015-11-21 DIAGNOSIS — Z87828 Personal history of other (healed) physical injury and trauma: Secondary | ICD-10-CM | POA: Insufficient documentation

## 2015-11-21 DIAGNOSIS — Z8505 Personal history of malignant neoplasm of liver: Secondary | ICD-10-CM | POA: Insufficient documentation

## 2015-11-21 DIAGNOSIS — I10 Essential (primary) hypertension: Secondary | ICD-10-CM | POA: Diagnosis not present

## 2015-11-21 DIAGNOSIS — F411 Generalized anxiety disorder: Secondary | ICD-10-CM | POA: Diagnosis not present

## 2015-11-21 DIAGNOSIS — M7989 Other specified soft tissue disorders: Secondary | ICD-10-CM | POA: Diagnosis not present

## 2015-11-21 DIAGNOSIS — F329 Major depressive disorder, single episode, unspecified: Secondary | ICD-10-CM | POA: Diagnosis not present

## 2015-11-21 LAB — I-STAT CHEM 8, ED
BUN: 17 mg/dL (ref 6–20)
CREATININE: 1 mg/dL (ref 0.44–1.00)
Calcium, Ion: 1.17 mmol/L (ref 1.13–1.30)
Chloride: 101 mmol/L (ref 101–111)
GLUCOSE: 137 mg/dL — AB (ref 65–99)
HCT: 39 % (ref 36.0–46.0)
HEMOGLOBIN: 13.3 g/dL (ref 12.0–15.0)
POTASSIUM: 3.9 mmol/L (ref 3.5–5.1)
Sodium: 141 mmol/L (ref 135–145)
TCO2: 30 mmol/L (ref 0–100)

## 2015-11-21 NOTE — ED Notes (Signed)
Pt here with c/o swelling and throbbing to left leg around her knee since Monday. She called her PCP and was told to come to ED to be evaluated for DVT.

## 2015-11-22 MED ORDER — HYDROCODONE-ACETAMINOPHEN 5-325 MG PO TABS: 1.0000 | ORAL_TABLET | Freq: Four times a day (QID) | ORAL | Status: DC | PRN

## 2015-11-22 NOTE — Discharge Instructions (Signed)
Knee Effusion °Knee effusion means that you have excess fluid in your knee joint. This can cause pain and swelling in your knee. This may make your knee more difficult to bend and move. That is because there is increased pain and pressure in the joint. If there is fluid in your knee, it often means that something is wrong inside your knee, such as severe arthritis, abnormal inflammation, or an infection. Another common cause of knee effusion is an injury to the knee muscles, ligaments, or cartilage. °HOME CARE INSTRUCTIONS °· Use crutches as directed by your health care provider. °· Wear a knee brace as directed by your health care provider. °· Apply ice to the swollen area: °¨ Put ice in a plastic bag. °¨ Place a towel between your skin and the bag. °¨ Leave the ice on for 20 minutes, 2-3 times per day. °· Keep your knee raised (elevated) when you are sitting or lying down. °· Take medicines only as directed by your health care provider. °· Do any rehabilitation or strengthening exercises as directed by your health care provider. °· Rest your knee as directed by your health care provider. You may start doing your normal activities again when your health care provider approves.    °· Keep all follow-up visits as directed by your health care provider. This is important. °SEEK MEDICAL CARE IF: °· You have ongoing (persistent) pain in your knee. °SEEK IMMEDIATE MEDICAL CARE IF: °· You have increased swelling or redness of your knee. °· You have severe pain in your knee. °· You have a fever. °  °This information is not intended to replace advice given to you by your health care provider. Make sure you discuss any questions you have with your health care provider. °  °Document Released: 08/22/2003 Document Revised: 06/22/2014 Document Reviewed: 01/15/2014 °Elsevier Interactive Patient Education ©2016 [employer] ° °

## 2015-11-22 NOTE — ED Provider Notes (Signed)
CSN: [id number]     Arrival date & time 11/21/15  2023 History  By signing my name below, I, Dora Sims, attest that this documentation has been prepared under the direction and in the presence of physician practitioner, Blanchie Dessert, MD. Electronically Signed: Dora Sims, Scribe. 11/22/2015. 12:18 AM.   Chief Complaint  Patient presents with  . Leg Swelling    The history is provided by the patient. No language interpreter was used.     HPI Comments: Candace Hill is a 70 y.o. female who presents to the Emergency Department complaining of sudden onset, constant, worsening, severe, localized left knee pain and swelling for the last 2 days. Pt reports that 4 days ago, she climbed up on a chair and reached to adjust the blinds in her home and felt a strange sensation around her left knee but no pain. Pt did not hear any pops or cracks when she was reaching to adjust her blinds. She reports that she woke up 2 days ago with left knee pain that has worsened since. She notes that her left knee has also felt warm and tight over the last several days. Pt used Tylenol Extra Strength x2 yesterday for her left knee pain with no relief. Pt endorses left knee pain exacerbation with standing, ambulation, and flexion. Pt takes 1 baby aspirin a day due to history of mini strokes. She denies left calf pain or any other associated symptoms. She is ambulatory. Pt states that she intends to follow up with her PCP soon.   Past Medical History  Diagnosis Date  . History of renal stone   . Hepatitis C   . Hernia, femoral     left  . Acid reflux   . 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 Broken rib     x2  . Broken ankle     right  . Abnormal uterine bleeding   . Blood transfusion without reported diagnosis     with first pregnancy  . History of shingles   . Asthma   . 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 Tailbone  injury     broken x2  . Hypertension   . Depression   . Renal stone 11/2012  . Central retinal vein occlusion   . TIA (transient ischemic attack)    Past Surgical History  Procedure Laterality Date  . Cholecystectomy    . Tranplant  02/17/08    liver  . Cataract extraction      bilateral  . Tubal ligation    . Nasal sinus surgery      x3  . Tonsillectomy  age 39  . Femoral hernia repair      left leg  . Corneal transplant     Family History  Problem Relation Age of Onset  . Adopted: Yes  . Stroke Mother   . Alcoholism Father   . Alzheimer's disease Mother    Social History  Substance Use Topics  . Smoking status: Never Smoker   . Smokeless tobacco: Never Used  . Alcohol Use: No   OB History    Gravida Para Term Preterm AB TAB SAB Ectopic Multiple Living   2 2 2       2      Review of Systems  A complete 10 system review of systems was obtained and all systems are negative except as noted  in the HPI and PMH.   Allergies  Codeine; Lidocaine; Penicillins; Phenergan; Sulfa antibiotics; and Tamsulosin  Home Medications   Prior to Admission medications   Medication Sig Start Date End Date Taking? Authorizing Provider  acetaminophen (TYLENOL) 500 MG tablet Take 500 mg by mouth every 6 (six) hours as needed for pain.    Historical Provider, MD  aspirin 81 MG tablet Take 81 mg by mouth daily.    Historical Provider, MD  brimonidine (ALPHAGAN P) 0.1 % SOLN Place 1 drop into both eyes 2 (two) times daily.    Historical Provider, MD  folic acid (FOLVITE) A999333 MCG tablet Take 400 mcg by mouth daily.    Historical Provider, MD  potassium citrate (UROCIT-K) 10 MEQ (1080 MG) SR tablet Take 10 mEq by mouth 2 (two) times daily.  09/18/12   Historical Provider, MD  propranolol ER (INDERAL LA) 80 MG 24 hr capsule Take 1 capsule (80 mg total) by mouth daily. 04/01/15   Marcial Pacas, MD  pyridOXINE (VITAMIN B-6) 100 MG tablet Take 100 mg by mouth daily.    Historical Provider, MD   rizatriptan (MAXALT-MLT) 5 MG disintegrating tablet Take 1 tablet (5 mg total) by mouth as needed. May repeat in 2 hours if needed 04/01/15   Marcial Pacas, MD  tacrolimus (PROGRAF) 0.5 MG capsule Take 1.5 mg by mouth 2 (two) times daily.  01/29/15   Historical Provider, MD  venlafaxine XR (EFFEXOR-XR) 150 MG 24 hr capsule Take by mouth. Take 150mg  along with 75mg  at bedtime.    Historical Provider, MD  venlafaxine XR (EFFEXOR-XR) 75 MG 24 hr capsule Take by mouth. Take 150mg , along with 75mg  at bedtime.    Historical Provider, MD  Vitamin D, Ergocalciferol, (DRISDOL) 50000 UNITS CAPS capsule TAKE 1 C PO ONCE WEEKLY 04/22/15   Historical Provider, MD   BP 131/70 mmHg  Pulse 71  Temp(Src) 98.5 F (36.9 C) (Oral)  Resp 20  Ht 5\' 3"  (1.6 m)  Wt 164 lb 5 oz (74.532 kg)  BMI 29.11 kg/m2  SpO2 98% Physical Exam  Constitutional: She is oriented to person, place, and time. She appears well-developed and well-nourished. No distress.  HENT:  Head: Normocephalic and atraumatic.  Eyes: Conjunctivae and EOM are normal.  Neck: Neck supple. No tracheal deviation present.  Pulmonary/Chest: Effort normal. No respiratory distress.  Musculoskeletal: Normal range of motion.  Left knee: significant joint effusion over left knee; FROM; mild warmth, no erythema, no calf tenderness or swelling. Normal sensation and pulse in left foot  Neurological: She is alert and oriented to person, place, and time.  Skin: Skin is warm and dry.  Psychiatric: She has a normal mood and affect. Her behavior is normal.  Nursing note and vitals reviewed.   ED Course  Procedures (including critical care time)  DIAGNOSTIC STUDIES: Oxygen Saturation is 98% on RA, normal by my interpretation.    COORDINATION OF CARE: 12:18 AM Discussed treatment plan with pt at bedside and pt agreed to plan.  Labs Review Labs Reviewed  I-STAT CHEM 8, ED - Abnormal; Notable for the following:    Glucose, Bld 137 (*)    All other components  within normal limits    Imaging Review Dg Knee Complete 4 Views Left  11/21/2015  CLINICAL DATA:  70 year old female with left knee swelling and pain. EXAM: LEFT KNEE - COMPLETE 4+ VIEW COMPARISON:  None. FINDINGS: There is no acute fracture or dislocation. No significant arthropathy. The bones are mildly osteopenic. The  or significant joint effusion. There is soft tissue prominence the medial aspect of the knee. No radiopaque foreign object. IMPRESSION: No acute osseous pathology. Electronically Signed   By: Anner Crete M.D.   On: 11/21/2015 21:45   I have personally reviewed and evaluated these images and lab results as part of my medical decision-making.   EKG Interpretation None      MDM   Final diagnoses:  Knee effusion, left    Pt with significant medical hx of liver transplant, stroke presenting with worsening left knee pain and swelling over the last 3 days since climbing on a chair and feeling a strange sensation in there left knee.  Pt denies SOB, chest pain or calf pain/swelling.  ON exam pt has significant left knee effusion with mild warmth but no erythema or signs of septic joint.  No calf pain, swelling or erythema.  No thigh pain, swelling or erythema.  All sx localized to the knee with normal x-ray.  No signs of DVT and pcp sent here to r/o dvt based on phone conversation.  Based on exam and hx concern for knee strain or possible torn ligament or meniscus given effusion.  disucssed with pt and knee wrapped and encouraged elevation and ice.  Pt encouraged to f/u with pcp and if she develops thigh/calf pain or swelling may need doppler. I personally performed the services described in this documentation, which was scribed in my presence.  The recorded information has been reviewed and considered.   Blanchie Dessert, MD 11/22/15 2118

## 2015-11-25 DIAGNOSIS — M25562 Pain in left knee: Secondary | ICD-10-CM | POA: Diagnosis not present

## 2015-12-11 ENCOUNTER — Telehealth: Payer: Self-pay | Admitting: Neurology

## 2015-12-11 NOTE — Telephone Encounter (Signed)
Bone density scan was not ordered here.  Spoke to patient - she will make her orthopedic physician aware.

## 2015-12-11 NOTE — Telephone Encounter (Signed)
Patient is calling to discuss a bone density scan she thought she had this year and our office had scheduled for her.

## 2015-12-18 DIAGNOSIS — M545 Low back pain: Secondary | ICD-10-CM | POA: Diagnosis not present

## 2015-12-24 DIAGNOSIS — M545 Low back pain: Secondary | ICD-10-CM | POA: Diagnosis not present

## 2015-12-24 DIAGNOSIS — M4316 Spondylolisthesis, lumbar region: Secondary | ICD-10-CM | POA: Diagnosis not present

## 2015-12-30 DIAGNOSIS — Z944 Liver transplant status: Secondary | ICD-10-CM | POA: Diagnosis not present

## 2015-12-30 DIAGNOSIS — E612 Magnesium deficiency: Secondary | ICD-10-CM | POA: Diagnosis not present

## 2015-12-30 DIAGNOSIS — Z5181 Encounter for therapeutic drug level monitoring: Secondary | ICD-10-CM | POA: Diagnosis not present

## 2015-12-31 DIAGNOSIS — M545 Low back pain: Secondary | ICD-10-CM | POA: Diagnosis not present

## 2015-12-31 DIAGNOSIS — M4316 Spondylolisthesis, lumbar region: Secondary | ICD-10-CM | POA: Diagnosis not present

## 2016-01-08 DIAGNOSIS — M4316 Spondylolisthesis, lumbar region: Secondary | ICD-10-CM | POA: Diagnosis not present

## 2016-01-08 DIAGNOSIS — M545 Low back pain: Secondary | ICD-10-CM | POA: Diagnosis not present

## 2016-01-10 DIAGNOSIS — M545 Low back pain: Secondary | ICD-10-CM | POA: Diagnosis not present

## 2016-01-10 DIAGNOSIS — M4316 Spondylolisthesis, lumbar region: Secondary | ICD-10-CM | POA: Diagnosis not present

## 2016-01-14 DIAGNOSIS — E612 Magnesium deficiency: Secondary | ICD-10-CM | POA: Diagnosis not present

## 2016-01-14 DIAGNOSIS — Z5181 Encounter for therapeutic drug level monitoring: Secondary | ICD-10-CM | POA: Diagnosis not present

## 2016-01-14 DIAGNOSIS — Z944 Liver transplant status: Secondary | ICD-10-CM | POA: Diagnosis not present

## 2016-01-23 DIAGNOSIS — Z944 Liver transplant status: Secondary | ICD-10-CM | POA: Diagnosis not present

## 2016-01-23 DIAGNOSIS — F322 Major depressive disorder, single episode, severe without psychotic features: Secondary | ICD-10-CM | POA: Diagnosis not present

## 2016-01-23 DIAGNOSIS — E559 Vitamin D deficiency, unspecified: Secondary | ICD-10-CM | POA: Diagnosis not present

## 2016-01-23 DIAGNOSIS — E7800 Pure hypercholesterolemia, unspecified: Secondary | ICD-10-CM | POA: Diagnosis not present

## 2016-01-23 DIAGNOSIS — Z0000 Encounter for general adult medical examination without abnormal findings: Secondary | ICD-10-CM | POA: Diagnosis not present

## 2016-01-23 DIAGNOSIS — Z8505 Personal history of malignant neoplasm of liver: Secondary | ICD-10-CM | POA: Diagnosis not present

## 2016-01-27 DIAGNOSIS — Z944 Liver transplant status: Secondary | ICD-10-CM | POA: Diagnosis not present

## 2016-01-27 DIAGNOSIS — Z880 Allergy status to penicillin: Secondary | ICD-10-CM | POA: Diagnosis not present

## 2016-01-27 DIAGNOSIS — Z885 Allergy status to narcotic agent status: Secondary | ICD-10-CM | POA: Diagnosis not present

## 2016-01-27 DIAGNOSIS — K7581 Nonalcoholic steatohepatitis (NASH): Secondary | ICD-10-CM | POA: Diagnosis not present

## 2016-01-27 DIAGNOSIS — R748 Abnormal levels of other serum enzymes: Secondary | ICD-10-CM | POA: Diagnosis not present

## 2016-02-11 DIAGNOSIS — F331 Major depressive disorder, recurrent, moderate: Secondary | ICD-10-CM | POA: Diagnosis not present

## 2016-02-11 DIAGNOSIS — F411 Generalized anxiety disorder: Secondary | ICD-10-CM | POA: Diagnosis not present

## 2016-02-13 DIAGNOSIS — F331 Major depressive disorder, recurrent, moderate: Secondary | ICD-10-CM | POA: Diagnosis not present

## 2016-02-26 DIAGNOSIS — K760 Fatty (change of) liver, not elsewhere classified: Secondary | ICD-10-CM | POA: Diagnosis not present

## 2016-02-26 DIAGNOSIS — Z23 Encounter for immunization: Secondary | ICD-10-CM | POA: Diagnosis not present

## 2016-02-27 DIAGNOSIS — N200 Calculus of kidney: Secondary | ICD-10-CM | POA: Diagnosis not present

## 2016-02-28 DIAGNOSIS — N200 Calculus of kidney: Secondary | ICD-10-CM | POA: Diagnosis not present

## 2016-03-03 DIAGNOSIS — F331 Major depressive disorder, recurrent, moderate: Secondary | ICD-10-CM | POA: Diagnosis not present

## 2016-03-05 DIAGNOSIS — L908 Other atrophic disorders of skin: Secondary | ICD-10-CM | POA: Diagnosis not present

## 2016-03-05 DIAGNOSIS — Z961 Presence of intraocular lens: Secondary | ICD-10-CM | POA: Diagnosis not present

## 2016-03-05 DIAGNOSIS — H34812 Central retinal vein occlusion, left eye, with macular edema: Secondary | ICD-10-CM | POA: Diagnosis not present

## 2016-03-23 DIAGNOSIS — M79671 Pain in right foot: Secondary | ICD-10-CM | POA: Diagnosis not present

## 2016-03-24 ENCOUNTER — Other Ambulatory Visit: Payer: Self-pay | Admitting: Family Medicine

## 2016-03-24 ENCOUNTER — Ambulatory Visit
Admission: RE | Admit: 2016-03-24 | Discharge: 2016-03-24 | Disposition: A | Discharge status: Home / Self Care | Payer: Medicare Other | Source: Ambulatory Visit | Attending: Family Medicine | Admitting: Family Medicine

## 2016-03-24 DIAGNOSIS — M19071 Primary osteoarthritis, right ankle and foot: Secondary | ICD-10-CM | POA: Diagnosis not present

## 2016-03-24 DIAGNOSIS — R52 Pain, unspecified: Secondary | ICD-10-CM

## 2016-03-25 DIAGNOSIS — F331 Major depressive disorder, recurrent, moderate: Secondary | ICD-10-CM | POA: Diagnosis not present

## 2016-04-13 ENCOUNTER — Telehealth: Payer: Self-pay | Admitting: Neurology

## 2016-04-13 ENCOUNTER — Other Ambulatory Visit: Payer: Self-pay | Admitting: *Deleted

## 2016-04-13 MED ORDER — PROPRANOLOL HCL ER 80 MG PO CP24: 80.0000 mg | ORAL_CAPSULE | Freq: Every day | ORAL | 11 refills | Status: DC

## 2016-04-13 NOTE — Telephone Encounter (Signed)
Pharmacy updated and rx sent to new pharmacy.

## 2016-04-13 NOTE — Telephone Encounter (Signed)
Pt request refill for propranolol ER (INDERAL LA) 80 MG 24 hr capsule sent to [facility]. She is moving all RX to this pharmacy

## 2016-05-13 DIAGNOSIS — N200 Calculus of kidney: Secondary | ICD-10-CM | POA: Diagnosis not present

## 2016-05-13 DIAGNOSIS — R310 Gross hematuria: Secondary | ICD-10-CM | POA: Diagnosis not present

## 2016-05-20 DIAGNOSIS — F411 Generalized anxiety disorder: Secondary | ICD-10-CM | POA: Diagnosis not present

## 2016-05-20 DIAGNOSIS — F331 Major depressive disorder, recurrent, moderate: Secondary | ICD-10-CM | POA: Diagnosis not present

## 2016-05-21 DIAGNOSIS — H401131 Primary open-angle glaucoma, bilateral, mild stage: Secondary | ICD-10-CM | POA: Diagnosis not present

## 2016-05-21 DIAGNOSIS — Z8679 Personal history of other diseases of the circulatory system: Secondary | ICD-10-CM | POA: Diagnosis not present

## 2016-05-21 DIAGNOSIS — H35352 Cystoid macular degeneration, left eye: Secondary | ICD-10-CM | POA: Diagnosis not present

## 2016-05-21 DIAGNOSIS — Z961 Presence of intraocular lens: Secondary | ICD-10-CM | POA: Diagnosis not present

## 2016-06-17 DIAGNOSIS — R310 Gross hematuria: Secondary | ICD-10-CM | POA: Diagnosis not present

## 2016-06-17 DIAGNOSIS — R8271 Bacteriuria: Secondary | ICD-10-CM | POA: Diagnosis not present

## 2016-06-17 DIAGNOSIS — N200 Calculus of kidney: Secondary | ICD-10-CM | POA: Diagnosis not present

## 2016-06-25 ENCOUNTER — Other Ambulatory Visit: Payer: Self-pay | Admitting: Urology

## 2016-06-25 NOTE — Progress Notes (Signed)
Scheduling pre op- please place surgical orders in epic

## 2016-07-01 ENCOUNTER — Encounter (HOSPITAL_COMMUNITY): Payer: Self-pay

## 2016-07-01 NOTE — Patient Instructions (Addendum)
Candace Hill  07/01/2016   Your procedure is scheduled on: 07/06/16  Report to [facility] Main  Entrance take [facility]  elevators to 3rd floor to  [place] at    11:45 AM.  Call this number if you have problems the morning of surgery [phone]   Remember: ONLY 1 PERSON MAY GO WITH YOU TO SHORT STAY TO GET  READY MORNING OF [employer].  Do not eat food or drink liquids :After Midnight.     Take these medicines the morning of surgery with A SIP OF WATER: Eye drops as usual, urocrit-k, prograf(tacrolimus),Propranlol(Inderal)                                You may not have any metal on your body including hair pins and              piercings  Do not wear jewelry, make-up, lotions, powders or perfumes, deodorant             Do not wear nail polish.  Do not shave  48 hours prior to surgery.              Do not bring valuables to the hospital. [place].  Contacts, dentures or bridgework may not be worn into surgery.      Patients discharged the day of surgery will not be allowed to drive home.  Name and phone number of your driver:  Special Instructions: N/A              Please read over the following fact sheets you were given: _____________________________________________________________________             [facility] - Preparing for Surgery Before surgery, you can play an important role.  Because skin is not sterile, your skin needs to be as free of germs as possible.  You can reduce the number of germs on your skin by washing with CHG (chlorahexidine gluconate) soap before surgery.  CHG is an antiseptic cleaner which kills germs and bonds with the skin to continue killing germs even after washing. Please DO NOT use if you have an allergy to CHG or antibacterial soaps.  If your skin becomes reddened/irritated stop using the CHG and inform your nurse when you arrive at Short Stay. Do not shave  (including legs and underarms) for at least 48 hours prior to the first CHG shower.  You may shave your face/neck. Please follow these instructions carefully:  1.  Shower with CHG Soap the night before surgery and the  morning of Surgery.  2.  If you choose to wash your hair, wash your hair first as usual with your  normal  shampoo.  3.  After you shampoo, rinse your hair and body thoroughly to remove the  shampoo.                           4.  Use CHG as you would any other liquid soap.  You can apply chg directly  to the skin and wash                       Gently with a scrungie  or clean washcloth.  5.  Apply the CHG Soap to your body ONLY FROM THE NECK DOWN.   Do not use on face/ open                           Wound or open sores. Avoid contact with eyes, ears mouth and genitals (private parts).                       Wash face,  Genitals (private parts) with your normal soap.             6.  Wash thoroughly, paying special attention to the area where your surgery  will be performed.  7.  Thoroughly rinse your body with warm water from the neck down.  8.  DO NOT shower/wash with your normal soap after using and rinsing off  the CHG Soap.                9.  Pat yourself dry with a clean towel.            10.  Wear clean pajamas.            11.  Place clean sheets on your bed the night of your first shower and do not  sleep with pets. Day of Surgery : Do not apply any lotions/deodorants the morning of surgery.  Please wear clean clothes to the hospital/surgery center.  FAILURE TO FOLLOW THESE INSTRUCTIONS MAY RESULT IN THE CANCELLATION OF YOUR SURGERY PATIENT SIGNATURE_________________________________  NURSE SIGNATURE__________________________________  ________________________________________________________________________    CLEAR LIQUID DIET   Foods Allowed                                                                     Foods Excluded  Coffee and tea, regular and decaf                              liquids that you cannot  Plain Jell-O in any flavor                                             see through such as: Fruit ices (not with fruit pulp)                                     milk, soups, orange juice  Iced Popsicles                                    All solid food Carbonated beverages, regular and diet                                    Cranberry, grape and apple juices Sports drinks like Gatorade Lightly seasoned clear broth or consume(fat free) Sugar, honey syrup  Sample Menu Breakfast  Lunch                                     Supper Cranberry juice                    Beef broth                            Chicken broth Jell-O                                     Grape juice                           Apple juice Coffee or tea                        Jell-O                                      Popsicle                                                Coffee or tea                        Coffee or tea  _____________________________________________________________________

## 2016-07-02 NOTE — H&P (Signed)
Office Visit Report     06/17/2016   --------------------------------------------------------------------------------   Candace Hill. [place]  MRN: [id number]  PRIMARY CARE:  Candace Rider, MD  DOB: [birth year], 71 year old Female  REFERRING:  Candace Ralph, NP  SSN: -[id number]  PROVIDER:  Raynelle Bring, M.D.    LOCATION:  [facility] [phone]   --------------------------------------------------------------------------------   CC/HPI: Gross hematuria and urolithiasis   [name] follows up today after developing multiple episodes of painless gross hematuria intermittently over the past few weeks. She was seen at the very end of November by Candace Ralph, NP, and underwent a stone protocol CT scan demonstrating large burden multiple left renal pelvic calculi. She did not have any definite obstruction but did have some fullness of the left renal collecting system. No other concerning abnormalities were noted. She follows up today for further evaluation. She has continued to have intermittent gross hematuria since then. She denies any significant flank pain but does have chronic back pain which sometimes makes it difficult for her to tell when she is having pain related to other causes. She does describe some bilateral lower abdominal cramping that has been mild to moderate. She denies any fever or dysuria.     ALLERGIES: Augmentin TABS - Nausea Codeine Derivatives - Nausea Lidocaine HCl (PF) SOLN - Other Reaction, palpations novacaine - Other Reaction, palpations Penicillins - Other Reaction, unknown Phenergan TABS - Other Reaction, hallucination Sulfa Drugs - Nausea    MEDICATIONS: Potassium Citrate Er 10 meq (1,080 mg) tablet, extended release TAKE 1 TABLET BY MOUTH EVERY MORNING AND 2 TABLETS BY MOUTH EVERY EVENING  Aspirin Ec 81 mg tablet, delayed release  Cymbalta 60 MG Oral Capsule Delayed Release Particles Oral  Prograf 0.5 MG Oral Capsule 0 Oral  Propranolol HCl  - 80 MG Oral Tablet Oral  Rizatriptan Benzoate 5 MG Oral Tablet Oral     GU PSH: Renal ESWL - 2014    NON-GU PSH: Cholecystectomy Hernia Repair - 2011 Sinus Surgery Transplant Liver    GU PMH: Gross hematuria, Culture urine. No ABX unless culture proven UTI. - 05/13/2016 Kidney Stone, Nephrolithiasis - 10/29/2015 Urinary Tract Inf, Unspec site, Urinary tract infection - 04/04/2015 Abnormal urine findings, Unspec, Abnormal urine findings - 2014 Nocturia, Nocturia - 2014 Renal Cysts, Simple, Renal cysts, acquired, bilateral - 2014      PMH Notes:   1) Urolithiasis: She has a history of uric acid and calcium oxalate urolithiasis and has required lithotripsy in the past.   Current treatment: Potassium citrate 20 mEq po bid   2) Atypical cytology: She was incidentally noted to have some thickening of the left renal pelvis on a CT scan in June 2014 at which time she had a symptomatic left UPJ calculus. This was very consistent with inflammation considering it was in the location of her stone. However, the radiologist read this as possibly worrisome for a urothelial tumor among the differential diagnoses. She had a followup CT scan in July which demonstrated resolution of the renal pelvic abnormality. A urine cytology was performed at the same time and demonstrated atypical cells. Another followup urine cytology in September was also atypical but a reflex FISH analysis was negative. It was felt that her initial findings were likely inflammatory considering the resolution of these findings on follow up imaging.   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 She is currently undergoing therapy for treatment of hepatitis C  which she contracted during a blood transfusion.     NON-GU PMH: Cerebral infarction, unspecified, Mini stroke - 04/04/2015 Asthma, Asthma - 2014 Hepatitis C, Chronic Hepatitis, C Virus - 2014 Personal history of other  diseases of the nervous system and sense organs, History of glaucoma - 2014 Personal history of other mental and behavioral disorders, History of depression - 2014 Unspecified cirrhosis of liver, Cirrhosis - 2014 Liver Cancer, History Stroke/TIA    FAMILY HISTORY: No Significant Family History - Runs In Family Strokes - Runs In Family   SOCIAL HISTORY: Marital Status: Divorced Current Smoking Status: Patient has never smoked.  Does not drink anymore.  Does not drink caffeine.    REVIEW OF SYSTEMS:    GU Review Female:   Patient reports burning /pain with urination, get up at night to urinate, leakage of urine, and stream starts and stops. Patient denies frequent urination, hard to postpone urination, trouble starting your stream, have to strain to urinate, and currently pregnant.  Gastrointestinal (Upper):   Patient denies nausea and vomiting.  Gastrointestinal (Lower):   Patient denies diarrhea and constipation.  Constitutional:   Patient reports night sweats. Patient denies fever, weight loss, and fatigue.  Skin:   Patient denies skin rash/ lesion and itching.  Eyes:   Patient denies blurred vision and double vision.  Ears/ Nose/ Throat:   Patient denies sore throat and sinus problems.  Hematologic/Lymphatic:   Patient denies swollen glands and easy bruising.  Cardiovascular:   Patient denies leg swelling and chest pains.  Respiratory:   Patient denies cough and shortness of breath.  Endocrine:   Patient denies excessive thirst.  Musculoskeletal:   Patient denies back pain and joint pain.  Neurological:   Patient denies headaches and dizziness.  Psychologic:   Patient denies depression and anxiety.   VITAL SIGNS:      06/17/2016 04:27 PM  Weight 165 lb / 74.84 kg  Height 63 in / 160.02 cm  BP 143/79 mmHg  Pulse 65 /min  BMI 29.2 kg/m   MULTI-SYSTEM PHYSICAL EXAMINATION:    Constitutional: Well-nourished. No physical deformities. Normally developed. Good grooming.   Respiratory: No labored breathing, no use of accessory muscles. Clear bilaterally.  Cardiovascular: Normal temperature, normal extremity pulses, no swelling, no varicosities. Regular rate and rhythm.     PAST DATA REVIEWED:  Source Of History:  Patient  X-Ray Review: C.T. Stone Protocol: Reviewed Films. She has large burden left renal pelvic calculi as noted above. No ureteral calculi are appreciated. The stones do appear to be radiopaque with Hounsfield units over 1000 consistent with calcium oxalate stones and not uric acid stones.    PROCEDURES:         Flexible Cystoscopy - 52000  Risks, benefits, and some of the potential complications of the procedure were discussed at length with the patient including infection, bleeding, voiding discomfort, urinary retention, fever, chills, sepsis, and others. All questions were answered. Informed consent was obtained. Antibiotic prophylaxis was given. Sterile technique and intraurethral analgesia were used.  Meatus:  Normal size. Normal location. Normal condition.  Urethra:  No hypermobility. No leakage.  Ureteral Orifices:  Normal location. Normal size. Normal shape. Effluxed clear urine.  Bladder:  No trabeculation. No tumors. Normal mucosa. No stones. Visualization was somewhat obscured due to bloody appearing urine although no source for bleeding or other abnormalities were identified with careful inspection of the mucosa.      The lower urinary tract was carefully examined. The procedure was well-tolerated and  without complications. Antibiotic instructions were given. Instructions were given to call the office immediately for bloody urine, difficulty urinating, urinary retention, painful or frequent urination, fever, chills, nausea, vomiting or other illness. The patient stated that she understood these instructions and would comply with them.         Urinalysis w/Scope Dipstick Dipstick Cont'd Micro  Color: Brown Bilirubin: Invalid WBC/hpf: 0  - 5/hpf  Appearance: Turbid Ketones: Invalid RBC/hpf: Packed/hpf  Specific Gravity: Invalid Blood: Invalid Bacteria: Few (10-25/hpf)  pH: Invalid Protein: Invalid Cystals: Amorph Phosphates  Glucose: Invalid Urobilinogen: Invalid Casts: NS (Not Seen)    Nitrites: Invalid Trichomonas: Not Present    Leukocyte Esterase: Invalid Mucous: Not Present      Epithelial Cells: NS (Not Seen)      Yeast: NS (Not Seen)      Sperm: Not Present    Notes: unable to accurately quantitate wbcs due to packed rbcs unable to accurately quantitate bacteria    ASSESSMENT:      ICD-10 Details  2 GU:   Kidney Stone - N20.0   3   Gross hematuria - R31.0   1 NON-GU:   Bacteriuria - R82.71    PLAN:            Medications New Meds: Cipro 500 mg tablet 1 tablet PO BID   #6  0 Refill(s)            Orders Labs Urine Culture and Sensitivity          Schedule Return Visit/Planned Activity: Other See Visit Notes             Note: Will call to schedule surgery          Document Letter(s):  Created for Patient: Clinical Summary         Notes:   1. Bacteriuria: She does not have symptoms suggestive of urinary tract infection although her urinalysis is concerning. Her urine has been cultured and she will be empirically treated with ciprofloxacin for 3 days pending her culture results especially considering her immunosuppression and her cystoscopy today. She will likely will need a repeat urine culture prior to any intervention for her stone disease.   2. Gross hematuria: She was reassured that her cystoscopy did not demonstrate any concerning findings. She has not had a complete evaluation considering she did not undergo a contrasted CT scan at her initial evaluation. Considering the fact that she is likely to undergo further treatment of her renal calculi, we will plan to perform retrograde pyelography at that time to rule out any urothelial lesions. Currently, the most likely cause for her hematuria would  either be infection or her left renal calculi.   3. Left renal calculi: Following resolution of any potential infection, we have discussed options for intervention and I did recommend treatment considering the increased burden of stones. I told her that I do not think ESWL will likely be effective treatment sparing the large burden of stones. We therefore discussed the pros and cons of ureteroscopic laser lithotripsy versus percutaneous nephrolithotomy. She does have multiple medical comorbidities and wishes to avoid any more major intervention if at all possible. She therefore wishes to proceed with ureteroscopic laser lithotripsy of her left renal calculi. She understands that this may need to be performed in a staged fashion. She understands the need for a postoperative ureteral stent. We have reviewed potential risks, complications, and the expected recovery process associated with this procedure. She gives informed consent. This will  be scheduled for the near future.   She has been instructed that she can remain on her immunosuppression and ASA 81 mg.   4. Recurrent urolithiasis: She has yet to complete her 24-hour urine. I strongly advised her that we should be quite aggressive with preventative treatment following treatment of her current stone burden.   CC: Dr. Darcus Austin     * Signed by Raynelle Bring, M.D. on 06/17/16 at 8:10 PM (EST)*

## 2016-07-03 ENCOUNTER — Encounter (HOSPITAL_COMMUNITY)
Admission: RE | Admit: 2016-07-03 | Discharge: 2016-07-03 | Disposition: A | Discharge status: Home / Self Care | Payer: Medicare Other | Source: Ambulatory Visit | Attending: Urology | Admitting: Urology

## 2016-07-03 ENCOUNTER — Encounter (HOSPITAL_COMMUNITY): Payer: Self-pay

## 2016-07-03 DIAGNOSIS — N200 Calculus of kidney: Secondary | ICD-10-CM | POA: Diagnosis not present

## 2016-07-03 DIAGNOSIS — I739 Peripheral vascular disease, unspecified: Secondary | ICD-10-CM | POA: Diagnosis not present

## 2016-07-03 DIAGNOSIS — Z944 Liver transplant status: Secondary | ICD-10-CM | POA: Diagnosis not present

## 2016-07-03 DIAGNOSIS — Z8673 Personal history of transient ischemic attack (TIA), and cerebral infarction without residual deficits: Secondary | ICD-10-CM | POA: Diagnosis not present

## 2016-07-03 DIAGNOSIS — Z7982 Long term (current) use of aspirin: Secondary | ICD-10-CM | POA: Diagnosis not present

## 2016-07-03 DIAGNOSIS — I10 Essential (primary) hypertension: Secondary | ICD-10-CM | POA: Diagnosis not present

## 2016-07-03 DIAGNOSIS — B1920 Unspecified viral hepatitis C without hepatic coma: Secondary | ICD-10-CM | POA: Diagnosis not present

## 2016-07-03 DIAGNOSIS — Z79899 Other long term (current) drug therapy: Secondary | ICD-10-CM | POA: Diagnosis not present

## 2016-07-03 HISTORY — DX: Adverse effect of unspecified anesthetic, initial encounter: T41.45XA

## 2016-07-03 HISTORY — DX: Cerebral infarction, unspecified: I63.9

## 2016-07-03 HISTORY — DX: Other complications of anesthesia, initial encounter: T88.59XA

## 2016-07-03 HISTORY — DX: Personal history of urinary calculi: Z87.442

## 2016-07-03 LAB — CBC
HEMATOCRIT: 40 % (ref 36.0–46.0)
HEMOGLOBIN: 13.9 g/dL (ref 12.0–15.0)
MCH: 31 pg (ref 26.0–34.0)
MCHC: 34.8 g/dL (ref 30.0–36.0)
MCV: 89.3 fL (ref 78.0–100.0)
Platelets: 174 10*3/uL (ref 150–400)
RBC: 4.48 MIL/uL (ref 3.87–5.11)
RDW: 13.1 % (ref 11.5–15.5)
WBC: 9.1 10*3/uL (ref 4.0–10.5)

## 2016-07-03 LAB — COMPREHENSIVE METABOLIC PANEL
ALT: 54 U/L (ref 14–54)
ANION GAP: 8 (ref 5–15)
AST: 40 U/L (ref 15–41)
Albumin: 4.5 g/dL (ref 3.5–5.0)
Alkaline Phosphatase: 50 U/L (ref 38–126)
BILIRUBIN TOTAL: 0.9 mg/dL (ref 0.3–1.2)
BUN: 15 mg/dL (ref 6–20)
CO2: 26 mmol/L (ref 22–32)
Calcium: 9.2 mg/dL (ref 8.9–10.3)
Chloride: 104 mmol/L (ref 101–111)
Creatinine, Ser: 1.03 mg/dL — ABNORMAL HIGH (ref 0.44–1.00)
GFR calc Af Amer: >60 mL/min (ref >60)
GFR, EST NON AFRICAN AMERICAN: 55 mL/min — AB (ref >60)
Glucose, Bld: 103 mg/dL — ABNORMAL HIGH (ref 65–99)
POTASSIUM: 4.1 mmol/L (ref 3.5–5.1)
Sodium: 138 mmol/L (ref 135–145)
TOTAL PROTEIN: 6.7 g/dL (ref 6.5–8.1)

## 2016-07-03 NOTE — Progress Notes (Signed)
Patient called and reviewed times of duloxetine and propanolol ( Inderal) with nurse. Instructed patient to take Prograf and Propanolol ( Inderal) prior to coming to hospital on 07/06/16 for surgery with sips of water.  Patient voiced understanding.

## 2016-07-03 NOTE — Progress Notes (Signed)
Final EKG done 07/03/16 in Epic

## 2016-07-03 NOTE — Progress Notes (Signed)
At time of preop appointment patient unaware of what times she takes her daily medications. Note on preop instructions with phone number to call [facility] with times medications taken by 4pm on 07/03/16.so we could instruct her what to take am of surgery.  At 1500p patient has not called back with times of medications,  Patient was called and left a voice mail message on [phone] asking patient to call us by 4pm today.

## 2016-07-06 ENCOUNTER — Ambulatory Visit (HOSPITAL_COMMUNITY)
Admission: RE | Admit: 2016-07-06 | Discharge: 2016-07-06 | Disposition: A | Discharge status: Home / Self Care | Payer: Medicare Other | Source: Ambulatory Visit | Attending: Urology | Admitting: Urology

## 2016-07-06 ENCOUNTER — Encounter (HOSPITAL_COMMUNITY): Payer: Self-pay | Admitting: *Deleted

## 2016-07-06 ENCOUNTER — Ambulatory Visit (HOSPITAL_COMMUNITY): Payer: Medicare Other | Admitting: Registered Nurse

## 2016-07-06 ENCOUNTER — Encounter (HOSPITAL_COMMUNITY)
Admission: RE | Disposition: A | Discharge status: Home / Self Care | Payer: Self-pay | Source: Ambulatory Visit | Attending: Urology

## 2016-07-06 DIAGNOSIS — I10 Essential (primary) hypertension: Secondary | ICD-10-CM | POA: Diagnosis not present

## 2016-07-06 DIAGNOSIS — I739 Peripheral vascular disease, unspecified: Secondary | ICD-10-CM | POA: Diagnosis not present

## 2016-07-06 DIAGNOSIS — Z79899 Other long term (current) drug therapy: Secondary | ICD-10-CM | POA: Insufficient documentation

## 2016-07-06 DIAGNOSIS — N200 Calculus of kidney: Secondary | ICD-10-CM | POA: Diagnosis not present

## 2016-07-06 DIAGNOSIS — Z944 Liver transplant status: Secondary | ICD-10-CM | POA: Diagnosis not present

## 2016-07-06 DIAGNOSIS — Z8673 Personal history of transient ischemic attack (TIA), and cerebral infarction without residual deficits: Secondary | ICD-10-CM | POA: Diagnosis not present

## 2016-07-06 DIAGNOSIS — B1920 Unspecified viral hepatitis C without hepatic coma: Secondary | ICD-10-CM | POA: Diagnosis not present

## 2016-07-06 DIAGNOSIS — Z466 Encounter for fitting and adjustment of urinary device: Secondary | ICD-10-CM | POA: Diagnosis not present

## 2016-07-06 DIAGNOSIS — F329 Major depressive disorder, single episode, unspecified: Secondary | ICD-10-CM | POA: Diagnosis not present

## 2016-07-06 DIAGNOSIS — J309 Allergic rhinitis, unspecified: Secondary | ICD-10-CM | POA: Diagnosis not present

## 2016-07-06 DIAGNOSIS — Z7982 Long term (current) use of aspirin: Secondary | ICD-10-CM | POA: Diagnosis not present

## 2016-07-06 HISTORY — PX: CYSTOSCOPY/URETEROSCOPY/HOLMIUM LASER/STENT PLACEMENT: SHX6546

## 2016-07-06 SURGERY — CYSTOSCOPY/URETEROSCOPY/HOLMIUM LASER/STENT PLACEMENT
Anesthesia: General | Laterality: Left

## 2016-07-06 MED ORDER — PROPOFOL 10 MG/ML IV BOLUS
INTRAVENOUS | Status: DC | PRN
  Administered 2016-07-06: 50 mg via INTRAVENOUS
  Administered 2016-07-06: 150 mg via INTRAVENOUS

## 2016-07-06 MED ORDER — TRAMADOL HCL 50 MG PO TABS: 50.0000 mg | ORAL_TABLET | Freq: Four times a day (QID) | ORAL | 0 refills | Status: DC | PRN

## 2016-07-06 MED ORDER — LACTATED RINGERS IV SOLN
INTRAVENOUS | Status: DC | PRN
  Administered 2016-07-06 (×2): via INTRAVENOUS

## 2016-07-06 MED ORDER — FENTANYL CITRATE (PF) 100 MCG/2ML IJ SOLN
INTRAMUSCULAR | Status: AC
  Filled 2016-07-06: qty 2

## 2016-07-06 MED ORDER — FENTANYL CITRATE (PF) 100 MCG/2ML IJ SOLN
25.0000 ug | INTRAMUSCULAR | Status: DC | PRN
  Administered 2016-07-06 (×2): 50 ug via INTRAVENOUS

## 2016-07-06 MED ORDER — CIPROFLOXACIN IN D5W 400 MG/200ML IV SOLN
INTRAVENOUS | Status: AC
  Filled 2016-07-06: qty 200

## 2016-07-06 MED ORDER — FENTANYL CITRATE (PF) 100 MCG/2ML IJ SOLN
INTRAMUSCULAR | Status: DC | PRN
  Administered 2016-07-06: 25 ug via INTRAVENOUS
  Administered 2016-07-06: 50 ug via INTRAVENOUS
  Administered 2016-07-06: 25 ug via INTRAVENOUS
  Administered 2016-07-06: 50 ug via INTRAVENOUS

## 2016-07-06 MED ORDER — ONDANSETRON HCL 4 MG/2ML IJ SOLN
INTRAMUSCULAR | Status: DC | PRN
  Administered 2016-07-06: 4 mg via INTRAVENOUS

## 2016-07-06 MED ORDER — SODIUM CHLORIDE 0.9 % IR SOLN
Status: DC | PRN
  Administered 2016-07-06: 6000 mL via INTRAVESICAL

## 2016-07-06 MED ORDER — TRAMADOL HCL 50 MG PO TABS
50.0000 mg | ORAL_TABLET | Freq: Four times a day (QID) | ORAL | Status: DC | PRN
  Administered 2016-07-06: 50 mg via ORAL
  Filled 2016-07-06 (×2): qty 1

## 2016-07-06 MED ORDER — PHENYLEPHRINE 40 MCG/ML (10ML) SYRINGE FOR IV PUSH (FOR BLOOD PRESSURE SUPPORT)
PREFILLED_SYRINGE | INTRAVENOUS | Status: AC
  Filled 2016-07-06: qty 10

## 2016-07-06 MED ORDER — PHENYLEPHRINE HCL 10 MG/ML IJ SOLN
INTRAMUSCULAR | Status: DC | PRN
  Administered 2016-07-06 (×2): 80 ug via INTRAVENOUS

## 2016-07-06 MED ORDER — IOHEXOL 300 MG/ML  SOLN
INTRAMUSCULAR | Status: DC | PRN
  Administered 2016-07-06: 7 mL via URETHRAL

## 2016-07-06 MED ORDER — ONDANSETRON HCL 4 MG/2ML IJ SOLN
INTRAMUSCULAR | Status: AC
  Filled 2016-07-06: qty 2

## 2016-07-06 MED ORDER — PROPOFOL 10 MG/ML IV BOLUS
INTRAVENOUS | Status: AC
  Filled 2016-07-06: qty 20

## 2016-07-06 MED ORDER — DEXAMETHASONE SODIUM PHOSPHATE 10 MG/ML IJ SOLN
INTRAMUSCULAR | Status: AC
  Filled 2016-07-06: qty 1

## 2016-07-06 MED ORDER — CIPROFLOXACIN IN D5W 400 MG/200ML IV SOLN
400.0000 mg | INTRAVENOUS | Status: AC
  Administered 2016-07-06: 400 mg via INTRAVENOUS

## 2016-07-06 MED ORDER — LACTATED RINGERS IV SOLN
INTRAVENOUS | Status: DC
  Administered 2016-07-06: 13:00:00 via INTRAVENOUS

## 2016-07-06 MED ORDER — MIDAZOLAM HCL 2 MG/2ML IJ SOLN
INTRAMUSCULAR | Status: AC
  Filled 2016-07-06: qty 2

## 2016-07-06 SURGICAL SUPPLY — 17 items
BAG URO CATCHER STRL LF (MISCELLANEOUS) ×2 IMPLANT
BASKET ZERO TIP NITINOL 2.4FR (BASKET) ×2 IMPLANT
CATH INTERMIT  6FR 70CM (CATHETERS) ×2 IMPLANT
CLOTH BEACON ORANGE TIMEOUT ST (SAFETY) ×2 IMPLANT
FIBER LASER FLEXIVA 365 (UROLOGICAL SUPPLIES) IMPLANT
FIBER LASER TRAC TIP (UROLOGICAL SUPPLIES) ×2 IMPLANT
GLOVE BIOGEL M STRL SZ7.5 (GLOVE) ×4 IMPLANT
GOWN STRL REUS W/TWL LRG LVL3 (GOWN DISPOSABLE) ×4 IMPLANT
GUIDEWIRE ANG ZIPWIRE 038X150 (WIRE) IMPLANT
GUIDEWIRE STR DUAL SENSOR (WIRE) ×2 IMPLANT
IV NS 1000ML (IV SOLUTION)
IV NS 1000ML BAXH (IV SOLUTION) IMPLANT
MANIFOLD NEPTUNE II (INSTRUMENTS) ×2 IMPLANT
PACK CYSTO (CUSTOM PROCEDURE TRAY) ×2 IMPLANT
SHEATH ACCESS URETERAL 38CM (SHEATH) ×2 IMPLANT
STENT CONTOUR 6FRX24X.038 (STENTS) ×2 IMPLANT
TUBING CONNECTING 10 (TUBING) ×2 IMPLANT

## 2016-07-06 NOTE — Interval H&P Note (Signed)
History and Physical Interval Note:  07/06/2016 1:54 PM  Candace Hill  has presented today for surgery, with the diagnosis of LEFT RENAL CALCULI, HEMATURIA  The various methods of treatment have been discussed with the patient and family. After consideration of risks, benefits and other options for treatment, the patient has consented to  Procedure(s): CYSTOSCOPY/URETEROSCOPY/ BILATERAL RETROGRADE/HOLMIUM LASER/STENT PLACEMENT (Left) as a surgical intervention .  The patient's history has been reviewed, patient examined, no change in status, stable for surgery.  I have reviewed the patient's chart and labs.  Questions were answered to the patient's satisfaction.     Sharmaine Bain,LES

## 2016-07-06 NOTE — Op Note (Signed)
Preoperative diagnosis: Left renal calculi  Postoperative diagnosis: Left renal calculi  Procedure:  1. Cystoscopy 2. Left ureteroscopy and stone removal 3. Ureteroscopic laser lithotripsy 4. Left ureteral stent placement (6x24) 5. Bilateral retrograde pyelography with interpretation  Surgeon: Pryor Curia. M.D.  Anesthesia: General  Complications: None  Intraoperative findings: Bilateral retrograde pyelography demonstrated multiple filling defect within the left renal pelvis consistent with the patient's known calculi without other abnormalities noted.  No filling defects of the right ureter or renal collecting system.  EBL: Minimal  Specimens: 1. Left renal calculi  Disposition of specimens: [facility] for stone analysis  Indication: Candace Hill  is a 71 y.o. patient with hematuria and urolithiasis. After reviewing the management options for treatment, they elected to proceed with the above surgical procedure(s). We have discussed the potential benefits and risks of the procedure, side effects of the proposed treatment, the likelihood of the patient achieving the goals of the procedure, and any potential problems that might occur during the procedure or recuperation. Informed consent has been obtained.  Description of procedure:  The patient was taken to the operating room and general anesthesia was induced.  The patient was placed in the dorsal lithotomy position, prepped and draped in the usual sterile fashion, and preoperative antibiotics were administered. A preoperative time-out was performed.   Cystourethroscopy was performed.  The patient's urethra was examined and was normal. The bladder was then systematically examined in its entirety. There was no evidence for any bladder tumors, stones, or other mucosal pathology.    Attention then turned to the right ureteral orifice and a ureteral catheter was used to intubate the ureteral orifice.   Omnipaque contrast was injected through the ureteral catheter and a retrograde pyelogram was performed with findings as dictated above.  Attention then turned to the left ureteral orifice and a ureteral catheter was used to intubate the ureteral orifice.  Omnipaque contrast was injected through the ureteral catheter and a retrograde pyelogram was performed with findings as dictated above.  A 0.38 sensor guidewire was then advanced up the left ureter into the renal pelvis under fluoroscopic guidance.  A 12/14 Fr ureteral access sheath was then advanced over the guide wire. The digital flexible ureteroscope was then advanced through the access sheath into the ureter next to the guidewire and the calculi were identified and were located in the left renal pelvis.   The stones were then fragmented with the 200 micron holmium laser fiber on a setting of 0.8 J and frequency of 6 Hz.   I then removed some of the stones with a zero tip nitinol basket.  However, due to the very large burden of stones and worsening visualization due to hematuria, further stone removal was felt to be futile.   The safety wire was then replaced and the access sheath removed.  The guidewire was backloaded through the cystoscope and a ureteral stent was advance over the wire using Seldinger technique.  The stent was positioned appropriately under fluoroscopic and cystoscopic guidance.  The wire was then removed with an adequate stent curl noted in the renal pelvis as well as in the bladder.  The bladder was then emptied and the procedure ended.  The patient appeared to tolerate the procedure well and without complications.  The patient was able to be awakened and transferred to the recovery unit in satisfactory condition.   I will discuss options with the patient of stent removal vs repeat ureteroscopic treatment to remove more of  her stones and try to clear her stone burden as much as possible.  Pryor Curia MD

## 2016-07-06 NOTE — Transfer of Care (Signed)
Immediate Anesthesia Transfer of Care Note  Patient: Candace Hill  Procedure(s) Performed: Procedure(s): CYSTOSCOPY/URETEROSCOPY/ BILATERAL RETROGRADE/HOLMIUM LASER/STENT PLACEMENT/BASKET STONE REMOVAL (Left)  Patient Location: PACU  Anesthesia Type:General  Level of Consciousness: awake, alert , oriented and patient cooperative  Airway & Oxygen Therapy: Patient Spontanous Breathing and Patient connected to face mask oxygen  Post-op Assessment: Report given to RN, Post -op Vital signs reviewed and stable and Patient moving all extremities X 4  Post vital signs: stable  Last Vitals:  Vitals:   07/06/16 1146  BP: 133/75  Pulse: 65  Resp: 16  Temp: 36.4 C    Last Pain:  Vitals:   07/06/16 1146  TempSrc: Oral      Patients Stated Pain Goal: 4 ([id number])  Complications: No apparent anesthesia complications

## 2016-07-06 NOTE — Discharge Instructions (Addendum)
General Anesthesia, Adult, Care After °These instructions provide you with information about caring for yourself after your procedure. Your health care provider may also give you more specific instructions. Your treatment has been planned according to current medical practices, but problems sometimes occur. Call your health care provider if you have any problems or questions after your procedure. °What can I expect after the procedure? °After the procedure, it is common to have: °Vomiting. °A sore throat. °Mental slowness. °It is common to feel: °Nauseous. °Cold or shivery. °Sleepy. °Tired. °Sore or achy, even in parts of your body where you did not have surgery. °Follow these instructions at home: °For at least 24 hours after the procedure:  °Do not: °Participate in activities where you could fall or become injured. °Drive. °Use heavy machinery. °Drink alcohol. °Take sleeping pills or medicines that cause drowsiness. °Make important decisions or sign legal documents. °Take care of children on your own. °Rest. °Eating and drinking  °If you vomit, drink water, juice, or soup when you can drink without vomiting. °Drink enough fluid to keep your urine clear or pale yellow. °Make sure you have little or no nausea before eating solid foods. °Follow the diet recommended by your health care provider. °General instructions  °Have a responsible adult stay with you until you are awake and alert. °Return to your normal activities as told by your health care provider. Ask your health care provider what activities are safe for you. °Take over-the-counter and prescription medicines only as told by your health care provider. °If you smoke, do not smoke without supervision. °Keep all follow-up visits as told by your health care provider. This is important. °Contact a health care provider if: °You continue to have nausea or vomiting at home, and medicines are not helpful. °You cannot drink fluids or start eating again. °You cannot  urinate after 8-12 hours. °You develop a skin rash. °You have fever. °You have increasing redness at the site of your procedure. °Get help right away if: °You have difficulty breathing. °You have chest pain. °You have unexpected bleeding. °You feel that you are having a life-threatening or urgent problem. °This information is not intended to replace advice given to you by your health care provider. Make sure you discuss any questions you have with your health care provider. °Document Released: 09/07/2000 Document Revised: 11/04/2015 Document Reviewed: 05/16/2015 °Elsevier Interactive Patient Education © 2017 [employer] ° ° ° ° °1. You may see some blood in the urine and may have some burning with urination for 48-72 hours. You also may notice that you have to urinate more frequently or urgently after your procedure which is normal.  °2. You should call should you develop an inability urinate, fever > 101, persistent nausea and vomiting that prevents you from eating or drinking to stay hydrated.  °3. If you have a stent, you will likely urinate more frequently and urgently until the stent is removed and you may experience some discomfort/pain in the lower abdomen and flank especially when urinating. You may take pain medication prescribed to you if needed for pain. You may also intermittently have blood in the urine until the stent is removed. ° ° °

## 2016-07-06 NOTE — Anesthesia Procedure Notes (Addendum)
Procedure Name: LMA Insertion Date/Time: 07/06/2016 2:09 PM Performed by: Enrigue Catena E Pre-anesthesia Checklist: Patient identified, Emergency Drugs available, Suction available and Patient being monitored Patient Re-evaluated:Patient Re-evaluated prior to inductionOxygen Delivery Method: Circle system utilized Preoxygenation: Pre-oxygenation with 100% oxygen Intubation Type: IV induction Ventilation: Mask ventilation without difficulty LMA: LMA with gastric port inserted LMA Size: 4.0 Tube type: Oral Number of attempts: 1 Airway Equipment and Method: Oral airway Placement Confirmation: positive ETCO2 and breath sounds checked- equal and bilateral Tube secured with: Tape Dental Injury: Teeth and Oropharynx as per pre-operative assessment

## 2016-07-06 NOTE — Anesthesia Preprocedure Evaluation (Addendum)
Anesthesia Evaluation  Patient identified by MRN, date of birth, ID band Patient awake    Reviewed: Allergy & Precautions, NPO status , Patient's Chart, lab work & pertinent test results  Airway Mallampati: II  TM Distance: >3 FB     Dental   Pulmonary asthma ,    breath sounds clear to auscultation       Cardiovascular hypertension, + Peripheral Vascular Disease   Rhythm:Regular Rate:Normal     Neuro/Psych  Headaches, TIA   GI/Hepatic GERD  ,(+) Hepatitis -  Endo/Other    Renal/GU Renal disease     Musculoskeletal   Abdominal   Peds  Hematology   Anesthesia Other Findings   Reproductive/Obstetrics                             Anesthesia Physical Anesthesia Plan  ASA: III  Anesthesia Plan: General   Post-op Pain Management:    Induction: Intravenous  Airway Management Planned: LMA  Additional Equipment:   Intra-op Plan:   Post-operative Plan: Extubation in OR  Informed Consent: I have reviewed the patients History and Physical, chart, labs and discussed the procedure including the risks, benefits and alternatives for the proposed anesthesia with the patient or authorized representative who has indicated his/her understanding and acceptance.   Dental advisory given  Plan Discussed with: CRNA and Anesthesiologist  Anesthesia Plan Comments:         Anesthesia Quick Evaluation

## 2016-07-06 NOTE — Anesthesia Postprocedure Evaluation (Signed)
Anesthesia Post Note  Patient: Candace Hill  Procedure(s) Performed: Procedure(s) (LRB): CYSTOSCOPY/URETEROSCOPY/ BILATERAL RETROGRADE/HOLMIUM LASER/STENT PLACEMENT/BASKET STONE REMOVAL (Left)  Patient location during evaluation: PACU Anesthesia Type: General Level of consciousness: awake Pain management: pain level controlled Vital Signs Assessment: post-procedure vital signs reviewed and stable Respiratory status: spontaneous breathing Cardiovascular status: stable Anesthetic complications: no       Last Vitals:  Vitals:   07/06/16 1630 07/06/16 1646  BP: (!) 142/98 (!) 153/76  Pulse: 64   Resp: 19   Temp:  36.7 C    Last Pain:  Vitals:   07/06/16 1615  TempSrc:   PainSc: 5                  Chenita Ruda

## 2016-07-07 ENCOUNTER — Encounter (HOSPITAL_COMMUNITY): Payer: Self-pay | Admitting: Urology

## 2016-07-08 DIAGNOSIS — N200 Calculus of kidney: Secondary | ICD-10-CM | POA: Diagnosis not present

## 2016-07-14 ENCOUNTER — Other Ambulatory Visit: Payer: Self-pay | Admitting: Urology

## 2016-07-31 ENCOUNTER — Other Ambulatory Visit (HOSPITAL_COMMUNITY): Payer: Self-pay | Admitting: *Deleted

## 2016-07-31 NOTE — Patient Instructions (Addendum)
Cantina Verbeck  07/31/2016   Your procedure is scheduled on: 08-06-16  Report to [facility] Main  Entrance take [facility]  elevators to 3rd floor to  [place] at 145 PM  Call this number if you have problems the morning of surgery [phone]   Remember: ONLY 1 PERSON MAY GO WITH YOU TO SHORT STAY TO GET  READY MORNING OF YOUR SURGERY.  Do not eat food or  :After Midnight Wednesday night, clear liquids from midnight Wednesday night until 945 am day of surgery, nothing by mouth aftre 945 am day of surgery.     Take these medicines the morning of surgery with A SIP OF WATER: eye drop, duloxetine (cymbalta), propranolol ER (Inderal LA), Tacrolimus (Prograf)                                You may not have any metal on your body including hair pins and              piercings  Do not wear jewelry, make-up, lotions, powders or perfumes, deodorant             Do not wear nail polish.  Do not shave  48 hours prior to surgery.              Men may shave face and neck.   Do not bring valuables to the hospital. [place].  Contacts, dentures or bridgework may not be worn into surgery.  Leave suitcase in the car. After surgery it may be brought to your room.     Patients discharged the day of surgery will not be allowed to drive home.  Name and phone number of your driver:april steelman daughter cell [phone]  Special Instructions: N/A              Please read over the following fact sheets you were given: _____________________________________________________________________                CLEAR LIQUID DIET   Foods Allowed                                                                     Foods Excluded  Coffee and tea, regular and decaf                             liquids that you cannot  Plain Jell-O in any flavor                                             see through such as: Fruit ices (not with  fruit pulp)                                     milk, soups,  orange juice  Iced Popsicles                                    All solid food Carbonated beverages, regular and diet                                    Cranberry, grape and apple juices Sports drinks like Gatorade Lightly seasoned clear broth or consume(fat free) Sugar, honey syrup  Sample Menu Breakfast                                Lunch                                     Supper Cranberry juice                    Beef broth                            Chicken broth Jell-O                                     Grape juice                           Apple juice Coffee or tea                        Jell-O                                      Popsicle                                                Coffee or tea                        Coffee or tea  _____________________________________________________________________  [facility] - Preparing for Surgery Before surgery, you can play an important role.  Because skin is not sterile, your skin needs to be as free of germs as possible.  You can reduce the number of germs on your skin by washing with CHG (chlorahexidine gluconate) soap before surgery.  CHG is an antiseptic cleaner which kills germs and bonds with the skin to continue killing germs even after washing. Please DO NOT use if you have an allergy to CHG or antibacterial soaps.  If your skin becomes reddened/irritated stop using the CHG and inform your nurse when you arrive at Short Stay. Do not shave (including legs and underarms) for at least 48 hours prior to the first CHG shower.  You may shave your face/neck. Please follow these instructions carefully:  1.  Shower with CHG Soap the night before surgery and the  morning of Surgery.  2.  If you choose to wash your hair,  wash your hair first as usual with your  normal  shampoo.  3.  After you shampoo, rinse your hair and body thoroughly to remove the  shampoo.                            4.  Use CHG as you would any other liquid soap.  You can apply chg directly  to the skin and wash                       Gently with a scrungie or clean washcloth.  5.  Apply the CHG Soap to your body ONLY FROM THE NECK DOWN.   Do not use on face/ open                           Wound or open sores. Avoid contact with eyes, ears mouth and genitals (private parts).                       Wash face,  Genitals (private parts) with your normal soap.             6.  Wash thoroughly, paying special attention to the area where your surgery  will be performed.  7.  Thoroughly rinse your body with warm water from the neck down.  8.  DO NOT shower/wash with your normal soap after using and rinsing off  the CHG Soap.                9.  Pat yourself dry with a clean towel.            10.  Wear clean pajamas.            11.  Place clean sheets on your bed the night of your first shower and do not  sleep with pets. Day of Surgery : Do not apply any lotions/deodorants the morning of surgery.  Please wear clean clothes to the hospital/surgery center.  FAILURE TO FOLLOW THESE INSTRUCTIONS MAY RESULT IN THE CANCELLATION OF YOUR SURGERY PATIENT SIGNATURE_________________________________  NURSE SIGNATURE__________________________________  ________________________________________________________________________

## 2016-07-31 NOTE — Progress Notes (Signed)
ekg 07-03-16 epic Carotid US 02-14-15 epic Echo 02-07-15 epic

## 2016-08-04 ENCOUNTER — Encounter (HOSPITAL_COMMUNITY)
Admission: RE | Admit: 2016-08-04 | Discharge: 2016-08-04 | Disposition: A | Discharge status: Home / Self Care | Payer: Medicare Other | Source: Ambulatory Visit | Attending: Urology | Admitting: Urology

## 2016-08-04 ENCOUNTER — Encounter (HOSPITAL_COMMUNITY): Payer: Self-pay

## 2016-08-04 DIAGNOSIS — N200 Calculus of kidney: Secondary | ICD-10-CM | POA: Diagnosis not present

## 2016-08-04 DIAGNOSIS — Z7982 Long term (current) use of aspirin: Secondary | ICD-10-CM | POA: Diagnosis not present

## 2016-08-04 DIAGNOSIS — Z8673 Personal history of transient ischemic attack (TIA), and cerebral infarction without residual deficits: Secondary | ICD-10-CM | POA: Diagnosis not present

## 2016-08-04 DIAGNOSIS — Z79899 Other long term (current) drug therapy: Secondary | ICD-10-CM | POA: Diagnosis not present

## 2016-08-04 DIAGNOSIS — Z01818 Encounter for other preprocedural examination: Secondary | ICD-10-CM

## 2016-08-04 DIAGNOSIS — I10 Essential (primary) hypertension: Secondary | ICD-10-CM | POA: Diagnosis not present

## 2016-08-04 DIAGNOSIS — F329 Major depressive disorder, single episode, unspecified: Secondary | ICD-10-CM | POA: Diagnosis not present

## 2016-08-04 DIAGNOSIS — B1920 Unspecified viral hepatitis C without hepatic coma: Secondary | ICD-10-CM | POA: Diagnosis not present

## 2016-08-04 DIAGNOSIS — Z944 Liver transplant status: Secondary | ICD-10-CM | POA: Diagnosis not present

## 2016-08-04 HISTORY — DX: Other recurrent depressive disorders: F33.8

## 2016-08-04 HISTORY — DX: Personal history of other medical treatment: Z92.89

## 2016-08-04 LAB — CBC
HCT: 38.8 % (ref 36.0–46.0)
HEMOGLOBIN: 13.7 g/dL (ref 12.0–15.0)
MCH: 31.1 pg (ref 26.0–34.0)
MCHC: 35.3 g/dL (ref 30.0–36.0)
MCV: 88.2 fL (ref 78.0–100.0)
Platelets: 199 10*3/uL (ref 150–400)
RBC: 4.4 MIL/uL (ref 3.87–5.11)
RDW: 13 % (ref 11.5–15.5)
WBC: 8.7 10*3/uL (ref 4.0–10.5)

## 2016-08-04 LAB — COMPREHENSIVE METABOLIC PANEL
ALT: 36 U/L (ref 14–54)
ANION GAP: 7 (ref 5–15)
AST: 45 U/L — ABNORMAL HIGH (ref 15–41)
Albumin: 4.4 g/dL (ref 3.5–5.0)
Alkaline Phosphatase: 50 U/L (ref 38–126)
BUN: 13 mg/dL (ref 6–20)
CHLORIDE: 103 mmol/L (ref 101–111)
CO2: 29 mmol/L (ref 22–32)
Calcium: 9.4 mg/dL (ref 8.9–10.3)
Creatinine, Ser: 1.14 mg/dL — ABNORMAL HIGH (ref 0.44–1.00)
GFR, EST AFRICAN AMERICAN: 56 mL/min — AB (ref >60)
GFR, EST NON AFRICAN AMERICAN: 48 mL/min — AB (ref >60)
Glucose, Bld: 104 mg/dL — ABNORMAL HIGH (ref 65–99)
POTASSIUM: 4.3 mmol/L (ref 3.5–5.1)
SODIUM: 139 mmol/L (ref 135–145)
Total Bilirubin: 0.7 mg/dL (ref 0.3–1.2)
Total Protein: 7.5 g/dL (ref 6.5–8.1)

## 2016-08-04 LAB — PROTIME-INR
INR: 0.97
PROTHROMBIN TIME: 12.9 s (ref 11.4–15.2)

## 2016-08-04 NOTE — Progress Notes (Signed)
  bmet changed to cmet due to liver transplant

## 2016-08-05 NOTE — H&P (Signed)
H&P Date of Service: 07/02/2016 12:07 PM Raynelle Bring, MD  Urology        CC/HPI: Gross hematuria and urolithiasis   She returns today for 2nd stage ureteroscopic laser lithotripsy of her left renal calculi.    ALLERGIES: Augmentin TABS - Nausea Codeine Derivatives - Nausea Lidocaine HCl (PF) SOLN - Other Reaction, palpations novacaine - Other Reaction, palpations Penicillins - Other Reaction, unknown Phenergan TABS - Other Reaction, hallucination Sulfa Drugs - Nausea    MEDICATIONS: Potassium Citrate Er 10 meq (1,080 mg) tablet, extended release TAKE 1 TABLET BY MOUTH EVERY MORNING AND 2 TABLETS BY MOUTH EVERY EVENING  Aspirin Ec 81 mg tablet, delayed release  Cymbalta 60 MG Oral Capsule Delayed Release Particles Oral  Prograf 0.5 MG Oral Capsule 0 Oral  Propranolol HCl - 80 MG Oral Tablet Oral  Rizatriptan Benzoate 5 MG Oral Tablet Oral     GU PSH: Renal ESWL - 2014    NON-GU PSH: Cholecystectomy Hernia Repair - 2011 Sinus Surgery Transplant Liver    GU PMH: Gross hematuria, Culture urine. No ABX unless culture proven UTI. - 05/13/2016 Kidney Stone, Nephrolithiasis - 10/29/2015 Urinary Tract Inf, Unspec site, Urinary tract infection - 04/04/2015 Abnormal urine findings, Unspec, Abnormal urine findings - 2014 Nocturia, Nocturia - 2014 Renal Cysts, Simple, Renal cysts, acquired, bilateral - 2014      PMH Notes:   1) Urolithiasis: She has a history of uric acid and calcium oxalate urolithiasis and has required lithotripsy in the past.   Current treatment: Potassium citrate 20 mEq po bid   2) Atypical cytology: She was incidentally noted to have some thickening of the left renal pelvis on a CT scan in June 2014 at which time she had a symptomatic left UPJ calculus. This was very consistent with inflammation considering it was in the location of her stone. However, the radiologist read this as possibly worrisome for a urothelial tumor among the  differential diagnoses. She had a followup CT scan in July which demonstrated resolution of the renal pelvic abnormality. A urine cytology was performed at the same time and demonstrated atypical cells. Another followup urine cytology in September was also atypical but a reflex FISH analysis was negative. It was felt that her initial findings were likely inflammatory considering the resolution of these findings on follow up imaging.   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 She is currently undergoing therapy for treatment of hepatitis C which she contracted during a blood transfusion.     NON-GU PMH: Cerebral infarction, unspecified, Mini stroke - 04/04/2015 Asthma, Asthma - 2014 Hepatitis C, Chronic Hepatitis, C Virus - 2014 Personal history of other diseases of the nervous system and sense organs, History of glaucoma - 2014 Personal history of other mental and behavioral disorders, History of depression - 2014 Unspecified cirrhosis of liver, Cirrhosis - 2014 Liver Cancer, History Stroke/TIA    FAMILY HISTORY: No Significant Family History - Runs In Family Strokes - Runs In Family   SOCIAL HISTORY: Marital Status: Divorced Current Smoking Status: Patient has never smoked.  Does not drink anymore.  Does not drink caffeine.    REVIEW OF SYSTEMS:    GU Review Female:   Patient reports burning /pain with urination, get up at night to urinate, leakage of urine, and stream starts and stops. Patient denies frequent urination, hard to postpone urination, trouble starting your stream, have to strain to urinate, and currently pregnant.  Gastrointestinal (Upper):  Patient denies nausea and vomiting.  Gastrointestinal (Lower):   Patient denies diarrhea and constipation.  Constitutional:   Patient reports night sweats. Patient denies fever, weight loss, and fatigue.  Skin:   Patient denies skin rash/ lesion and itching.  Eyes:    Patient denies blurred vision and double vision.  Ears/ Nose/ Throat:   Patient denies sore throat and sinus problems.  Hematologic/Lymphatic:   Patient denies swollen glands and easy bruising.  Cardiovascular:   Patient denies leg swelling and chest pains.  Respiratory:   Patient denies cough and shortness of breath.  Endocrine:   Patient denies excessive thirst.  Musculoskeletal:   Patient denies back pain and joint pain.  Neurological:   Patient denies headaches and dizziness.  Psychologic:   Patient denies depression and anxiety.     MULTI-SYSTEM PHYSICAL EXAMINATION:    Constitutional: Well-nourished. No physical deformities. Normally developed. Good grooming.  Respiratory: No labored breathing, no use of accessory muscles. Clear bilaterally.  Cardiovascular: Normal temperature, normal extremity pulses, no swelling, no varicosities. Regular rate and rhythm.         ASSESSMENT:        Kidney Stone - N20.0     PLAN:       Left renal calculi: She returns today for a second stage ureteroscopic procedure to help clear her renal pelvic stone burden.  We have reviewed the potential risks, complications, and expected recovery process.  All questions were answered to her stated satisfaction.  She will proceed with cystoscopy, left ureteroscopic laser lithotripsy and ureteral stent placement.

## 2016-08-06 ENCOUNTER — Ambulatory Visit (HOSPITAL_COMMUNITY): Payer: Medicare Other | Admitting: Anesthesiology

## 2016-08-06 ENCOUNTER — Ambulatory Visit (HOSPITAL_COMMUNITY)
Admission: RE | Admit: 2016-08-06 | Discharge: 2016-08-06 | Disposition: A | Discharge status: Home / Self Care | Payer: Medicare Other | Source: Ambulatory Visit | Attending: Urology | Admitting: Urology

## 2016-08-06 ENCOUNTER — Encounter (HOSPITAL_COMMUNITY): Payer: Self-pay

## 2016-08-06 ENCOUNTER — Encounter (HOSPITAL_COMMUNITY)
Admission: RE | Disposition: A | Discharge status: Home / Self Care | Payer: Self-pay | Source: Ambulatory Visit | Attending: Urology

## 2016-08-06 DIAGNOSIS — Z79899 Other long term (current) drug therapy: Secondary | ICD-10-CM | POA: Diagnosis not present

## 2016-08-06 DIAGNOSIS — Z466 Encounter for fitting and adjustment of urinary device: Secondary | ICD-10-CM | POA: Diagnosis not present

## 2016-08-06 DIAGNOSIS — F329 Major depressive disorder, single episode, unspecified: Secondary | ICD-10-CM | POA: Diagnosis not present

## 2016-08-06 DIAGNOSIS — Z944 Liver transplant status: Secondary | ICD-10-CM | POA: Insufficient documentation

## 2016-08-06 DIAGNOSIS — G43909 Migraine, unspecified, not intractable, without status migrainosus: Secondary | ICD-10-CM | POA: Diagnosis not present

## 2016-08-06 DIAGNOSIS — I10 Essential (primary) hypertension: Secondary | ICD-10-CM | POA: Insufficient documentation

## 2016-08-06 DIAGNOSIS — N200 Calculus of kidney: Secondary | ICD-10-CM | POA: Insufficient documentation

## 2016-08-06 DIAGNOSIS — Z8673 Personal history of transient ischemic attack (TIA), and cerebral infarction without residual deficits: Secondary | ICD-10-CM | POA: Diagnosis not present

## 2016-08-06 DIAGNOSIS — B1920 Unspecified viral hepatitis C without hepatic coma: Secondary | ICD-10-CM | POA: Insufficient documentation

## 2016-08-06 DIAGNOSIS — Z7982 Long term (current) use of aspirin: Secondary | ICD-10-CM | POA: Diagnosis not present

## 2016-08-06 HISTORY — PX: CYSTOSCOPY/URETEROSCOPY/HOLMIUM LASER/STENT PLACEMENT: SHX6546

## 2016-08-06 SURGERY — CYSTOSCOPY/URETEROSCOPY/HOLMIUM LASER/STENT PLACEMENT
Anesthesia: General | Laterality: Left

## 2016-08-06 MED ORDER — KETOROLAC TROMETHAMINE 30 MG/ML IJ SOLN: 30.0000 mg | Freq: Once | INTRAMUSCULAR | Status: DC | PRN

## 2016-08-06 MED ORDER — ACETAMINOPHEN 500 MG PO TABS: 500.0000 mg | ORAL_TABLET | Freq: Four times a day (QID) | ORAL | Status: DC | PRN

## 2016-08-06 MED ORDER — SUFENTANIL CITRATE 50 MCG/ML IV SOLN
INTRAVENOUS | Status: AC
  Filled 2016-08-06: qty 1

## 2016-08-06 MED ORDER — ACETAMINOPHEN 10 MG/ML IV SOLN
INTRAVENOUS | Status: DC | PRN
  Administered 2016-08-06: 1000 mg via INTRAVENOUS

## 2016-08-06 MED ORDER — SUFENTANIL CITRATE 50 MCG/ML IV SOLN
INTRAVENOUS | Status: DC | PRN
  Administered 2016-08-06: 5 ug via INTRAVENOUS
  Administered 2016-08-06: 2.5 ug via INTRAVENOUS

## 2016-08-06 MED ORDER — DEXAMETHASONE SODIUM PHOSPHATE 10 MG/ML IJ SOLN
INTRAMUSCULAR | Status: AC
  Filled 2016-08-06: qty 1

## 2016-08-06 MED ORDER — BELLADONNA ALKALOIDS-OPIUM 16.2-60 MG RE SUPP
RECTAL | Status: AC
  Filled 2016-08-06: qty 1

## 2016-08-06 MED ORDER — LACTATED RINGERS IV SOLN
INTRAVENOUS | Status: DC
  Administered 2016-08-06 (×2): via INTRAVENOUS

## 2016-08-06 MED ORDER — MIDAZOLAM HCL 2 MG/2ML IJ SOLN
INTRAMUSCULAR | Status: AC
  Filled 2016-08-06: qty 2

## 2016-08-06 MED ORDER — SODIUM CHLORIDE 0.9 % IR SOLN
Status: DC | PRN
  Administered 2016-08-06: 5000 mL via INTRAVESICAL

## 2016-08-06 MED ORDER — ONDANSETRON HCL 4 MG/2ML IJ SOLN
INTRAMUSCULAR | Status: DC | PRN
  Administered 2016-08-06: 4 mg via INTRAVENOUS

## 2016-08-06 MED ORDER — CIPROFLOXACIN IN D5W 400 MG/200ML IV SOLN
400.0000 mg | INTRAVENOUS | Status: AC
  Administered 2016-08-06: 400 mg via INTRAVENOUS
  Filled 2016-08-06: qty 200

## 2016-08-06 MED ORDER — ONDANSETRON HCL 4 MG/2ML IJ SOLN
INTRAMUSCULAR | Status: AC
  Filled 2016-08-06: qty 2

## 2016-08-06 MED ORDER — LIDOCAINE 2% (20 MG/ML) 5 ML SYRINGE
INTRAMUSCULAR | Status: AC
  Filled 2016-08-06: qty 5

## 2016-08-06 MED ORDER — ACETAMINOPHEN 10 MG/ML IV SOLN
INTRAVENOUS | Status: AC
  Filled 2016-08-06: qty 100

## 2016-08-06 MED ORDER — TRAMADOL HCL 50 MG PO TABS: 50.0000 mg | ORAL_TABLET | Freq: Four times a day (QID) | ORAL | 0 refills | Status: DC | PRN

## 2016-08-06 MED ORDER — PROMETHAZINE HCL 25 MG/ML IJ SOLN: 6.2500 mg | INTRAMUSCULAR | Status: DC | PRN

## 2016-08-06 MED ORDER — HYDROMORPHONE HCL 1 MG/ML IJ SOLN: 0.2500 mg | INTRAMUSCULAR | Status: DC | PRN

## 2016-08-06 MED ORDER — FENTANYL CITRATE (PF) 100 MCG/2ML IJ SOLN
INTRAMUSCULAR | Status: AC
  Filled 2016-08-06: qty 2

## 2016-08-06 MED ORDER — PROPOFOL 10 MG/ML IV BOLUS
INTRAVENOUS | Status: DC | PRN
  Administered 2016-08-06: 170 mg via INTRAVENOUS

## 2016-08-06 MED ORDER — BELLADONNA ALKALOIDS-OPIUM 16.2-60 MG RE SUPP
RECTAL | Status: DC | PRN
  Administered 2016-08-06: 1 via RECTAL

## 2016-08-06 MED ORDER — MIDAZOLAM HCL 5 MG/5ML IJ SOLN
INTRAMUSCULAR | Status: DC | PRN
  Administered 2016-08-06: 1 mg via INTRAVENOUS

## 2016-08-06 SURGICAL SUPPLY — 17 items
BAG URO CATCHER STRL LF (MISCELLANEOUS) ×2 IMPLANT
BASKET ZERO TIP NITINOL 2.4FR (BASKET) ×4 IMPLANT
CATH INTERMIT  6FR 70CM (CATHETERS) ×2 IMPLANT
CLOTH BEACON ORANGE TIMEOUT ST (SAFETY) ×2 IMPLANT
FIBER LASER FLEXIVA 365 (UROLOGICAL SUPPLIES) IMPLANT
FIBER LASER TRAC TIP (UROLOGICAL SUPPLIES) ×2 IMPLANT
GLOVE BIOGEL M STRL SZ7.5 (GLOVE) ×6 IMPLANT
GOWN STRL REUS W/TWL LRG LVL3 (GOWN DISPOSABLE) ×4 IMPLANT
GUIDEWIRE ANG ZIPWIRE 038X150 (WIRE) IMPLANT
GUIDEWIRE STR DUAL SENSOR (WIRE) ×2 IMPLANT
IV NS 1000ML (IV SOLUTION) ×1
IV NS 1000ML BAXH (IV SOLUTION) ×1 IMPLANT
MANIFOLD NEPTUNE II (INSTRUMENTS) ×2 IMPLANT
PACK CYSTO (CUSTOM PROCEDURE TRAY) ×2 IMPLANT
SHEATH ACCESS URETERAL 38CM (SHEATH) ×2 IMPLANT
STENT URET 6FRX24 CONTOUR (STENTS) ×2 IMPLANT
TUBING CONNECTING 10 (TUBING) ×2 IMPLANT

## 2016-08-06 NOTE — Op Note (Signed)
Preoperative diagnosis: Left renal calculi  Postoperative diagnosis: Left renal calculi  Procedure:  1. Cystoscopy 2. Left ureteroscopy and stone removal (2nd stage) 3. Ureteroscopic laser lithotripsy 4. Left ureteral stent placement (6 x 24 with string)  Surgeon: Roxy Horseman, Brooke Bonito. M.D.  Anesthesia: General  Complications: None  EBL: Minimal  Specimens: 1. Left renal calculi  Disposition of specimens: Given to patient's daughter.  Indication: Candace Hill  is a 71 y.o. patient with urolithiasis.  She had large burden left renal calculi and presents today for 2nd stage ureteroscopic laser lithotripsy. After reviewing the management options for treatment, they elected to proceed with the above surgical procedure(s). We have discussed the potential benefits and risks of the procedure, side effects of the proposed treatment, the likelihood of the patient achieving the goals of the procedure, and any potential problems that might occur during the procedure or recuperation. Informed consent has been obtained.  Description of procedure:  The patient was taken to the operating room and general anesthesia was induced.  The patient was placed in the dorsal lithotomy position, prepped and draped in the usual sterile fashion, and preoperative antibiotics were administered. A preoperative time-out was performed.   Cystourethroscopy was performed.  The patient's urethra was examined and was normal. The bladder was then systematically examined in its entirety. There was no evidence for any bladder tumors, stones, or other mucosal pathology.    Attention then turned to the left ureteral orifice.  The patient's indwelling ureteral stent was brought the urethral meatus.  A 0.38 sensor guidewire was then advanced up the left ureter through the stent into the renal pelvis under fluoroscopic guidance.  A 12/14 Fr ureteral access sheath was then advanced over the guide wire. The digital flexible  ureteroscope was then advanced through the access sheath into the ureter next to the guidewire and the calculi that remained were identified with the majority in the lower pole.     The larger stones was then fragmented with the 200 micron holmium laser fiber on a setting of 0.8J and frequency of 6 Hz.   All sizable stones were then removed with a zero tip nitinol basket.   A large burden of stones were removed over the course of about 85 minutes.  Once visualization became difficult, it was decided to stop the procedure.  It was estimated that about 90% of the initial stone burden had been removed.  The safety wire was then replaced and the access sheath removed.  The guidewire was backloaded through the cystoscope and a ureteral stent was advance over the wire using Seldinger technique.  The stent was positioned appropriately under fluoroscopic and cystoscopic guidance.  The wire was then removed with an adequate stent curl noted in the renal pelvis as well as in the bladder.  The bladder was then emptied and the procedure ended.  The patient appeared to tolerate the procedure well and without complications.  The patient was able to be awakened and transferred to the recovery unit in satisfactory condition.   Pryor Curia MD

## 2016-08-06 NOTE — Progress Notes (Signed)
Patient's right arm is mottled red below clear line where BP cuff was placed. She states BP cuff was really tight on her arm. Used left arm for BP.

## 2016-08-06 NOTE — Transfer of Care (Signed)
Immediate Anesthesia Transfer of Care Note  Patient: Candace Hill  Procedure(s) Performed: Procedure(s): CYSTOSCOPY/URETEROSCOPY/HOLMIUM LASER/STENT PLACEMENT (Left)  Patient Location: PACU  Anesthesia Type:General  Level of Consciousness: awake, alert , oriented and patient cooperative  Airway & Oxygen Therapy: Patient Spontanous Breathing and Patient connected to face mask oxygen  Post-op Assessment: Report given to RN, Post -op Vital signs reviewed and stable and Patient moving all extremities X 4  Post vital signs: stable  Last Vitals:  Vitals:   08/06/16 1400 08/06/16 1748  BP: 139/68 (!) 141/65  Pulse: 70 68  Resp: 16 14  Temp: 36.7 C 36.8 C    Last Pain:  Vitals:   08/06/16 1428  TempSrc:   PainSc: 9       Patients Stated Pain Goal: 4 ([id number])  Complications: No apparent anesthesia complications

## 2016-08-06 NOTE — Anesthesia Preprocedure Evaluation (Signed)
Anesthesia Evaluation  Patient identified by MRN, date of birth, ID band Patient awake    Reviewed: Allergy & Precautions, NPO status , Patient's Chart, lab work & pertinent test results  Airway Mallampati: II  TM Distance: >3 FB Neck ROM: Full    Dental no notable dental hx.    Pulmonary neg pulmonary ROS,    Pulmonary exam normal breath sounds clear to auscultation       Cardiovascular hypertension, Normal cardiovascular exam Rhythm:Regular Rate:Normal     Neuro/Psych TIAnegative psych ROS   GI/Hepatic negative GI ROS, Neg liver ROS, (+) Hepatitis -, C  Endo/Other  negative endocrine ROS  Renal/GU negative Renal ROS  negative genitourinary   Musculoskeletal negative musculoskeletal ROS (+)   Abdominal   Peds negative pediatric ROS (+)  Hematology negative hematology ROS (+)   Anesthesia Other Findings   Reproductive/Obstetrics negative OB ROS                             Anesthesia Physical Anesthesia Plan  ASA: III  Anesthesia Plan: General   Post-op Pain Management:    Induction: Intravenous  Airway Management Planned: LMA  Additional Equipment:   Intra-op Plan:   Post-operative Plan: Extubation in OR  Informed Consent: I have reviewed the patients History and Physical, chart, labs and discussed the procedure including the risks, benefits and alternatives for the proposed anesthesia with the patient or authorized representative who has indicated his/her understanding and acceptance.   Dental advisory given  Plan Discussed with: CRNA and Surgeon  Anesthesia Plan Comments:         Anesthesia Quick Evaluation

## 2016-08-06 NOTE — Discharge Instructions (Addendum)
General Anesthesia, Adult, Care After These instructions provide you with information about caring for yourself after your procedure. Your health care provider may also give you more specific instructions. Your treatment has been planned according to current medical practices, but problems sometimes occur. Call your health care provider if you have any problems or questions after your procedure. What can I expect after the procedure? After the procedure, it is common to have:  Vomiting.  A sore throat.  Mental slowness. It is common to feel:  Nauseous.  Cold or shivery.  Sleepy.  Tired.  Sore or achy, even in parts of your body where you did not have surgery. Follow these instructions at home: For at least 24 hours after the procedure:  Do not:  Participate in activities where you could fall or become injured.  Drive.  Use heavy machinery.  Drink alcohol.  Take sleeping pills or medicines that cause drowsiness.  Make important decisions or sign legal documents.  Take care of children on your own.  Rest. Eating and drinking  If you vomit, drink water, juice, or soup when you can drink without vomiting.  Drink enough fluid to keep your urine clear or pale yellow.  Make sure you have little or no nausea before eating solid foods.  Follow the diet recommended by your health care provider. General instructions  Have a responsible adult stay with you until you are awake and alert.  Return to your normal activities as told by your health care provider. Ask your health care provider what activities are safe for you.  Take over-the-counter and prescription medicines only as told by your health care provider.  If you smoke, do not smoke without supervision.  Keep all follow-up visits as told by your health care provider. This is important. Contact a health care provider if:  You continue to have nausea or vomiting at home, and medicines are not helpful.  You  cannot drink fluids or start eating again.  You cannot urinate after 8-12 hours.  You develop a skin rash.  You have fever.  You have increasing redness at the site of your procedure. Get help right away if:  You have difficulty breathing.  You have chest pain.  You have unexpected bleeding.  You feel that you are having a life-threatening or urgent problem. This information is not intended to replace advice given to you by your health care provider. Make sure you discuss any questions you have with your health care provider. Document Released: 09/07/2000 Document Revised: 11/04/2015 Document Reviewed: 05/16/2015 Elsevier Interactive Patient Education  2017 [place] may see some blood in the urine and may have some burning with urination for 48-72 hours. You also may notice that you have to urinate more frequently or urgently after your procedure which is normal.  2. You should call should you develop an inability urinate, fever > 101, persistent nausea and vomiting that prevents you from eating or drinking to stay hydrated.  3. If you have a stent, you will likely urinate more frequently and urgently until the stent is removed and you may experience some discomfort/pain in the lower abdomen and flank especially when urinating. You may take pain medication prescribed to you if needed for pain. You may also intermittently have blood in the urine until the stent is removed. 4. You may remove your stent on Monday morning.  Simply pull the string that is taped to your body and the stent  will easily come out.  This may be best done in the shower as some urine may come out with the stent.  Usually you will feel relief once the stent is removed, but occasionally patients can develop pain due to residual swelling of the ureter that may temporarily obstruct the kidney.  This can be managed by taking pain medication and it will typically resolve with time.  Please do not  hesitate to call if you have pain that is not controlled with your pain medication or does not improved within 24-48 hours.

## 2016-08-06 NOTE — Anesthesia Postprocedure Evaluation (Signed)
Anesthesia Post Note  Patient: Candace Hill  Procedure(s) Performed: Procedure(s) (LRB): CYSTOSCOPY/URETEROSCOPY/HOLMIUM LASER/STENT PLACEMENT (Left)  Patient location during evaluation: PACU Anesthesia Type: General Level of consciousness: awake and alert Pain management: pain level controlled Vital Signs Assessment: post-procedure vital signs reviewed and stable Respiratory status: spontaneous breathing, nonlabored ventilation, respiratory function stable and patient connected to nasal cannula oxygen Cardiovascular status: blood pressure returned to baseline and stable Postop Assessment: no signs of nausea or vomiting Anesthetic complications: no       Last Vitals:  Vitals:   08/06/16 1748 08/06/16 1800  BP: (!) 141/65 136/68  Pulse: 68 65  Resp: 14 14  Temp: 36.8 C     Last Pain:  Vitals:   08/06/16 1800  TempSrc:   PainSc: Asleep                 Brina Umeda S

## 2016-08-07 ENCOUNTER — Encounter (HOSPITAL_COMMUNITY): Payer: Self-pay | Admitting: Urology

## 2016-08-14 ENCOUNTER — Encounter (HOSPITAL_COMMUNITY): Payer: Self-pay | Admitting: *Deleted

## 2016-08-14 ENCOUNTER — Encounter (HOSPITAL_COMMUNITY)
Admission: RE | Disposition: A | Discharge status: Home / Self Care | Payer: Self-pay | Source: Ambulatory Visit | Attending: Urology

## 2016-08-14 ENCOUNTER — Ambulatory Visit (HOSPITAL_COMMUNITY): Payer: Medicare Other | Admitting: Anesthesiology

## 2016-08-14 ENCOUNTER — Observation Stay (HOSPITAL_COMMUNITY)
Admission: RE | Admit: 2016-08-14 | Discharge: 2016-08-16 | Disposition: A | Discharge status: Home / Self Care | Payer: Medicare Other | Source: Ambulatory Visit | Attending: Urology | Admitting: Urology

## 2016-08-14 ENCOUNTER — Other Ambulatory Visit: Payer: Self-pay | Admitting: Urology

## 2016-08-14 DIAGNOSIS — N200 Calculus of kidney: Secondary | ICD-10-CM | POA: Diagnosis not present

## 2016-08-14 DIAGNOSIS — N201 Calculus of ureter: Secondary | ICD-10-CM | POA: Diagnosis not present

## 2016-08-14 DIAGNOSIS — I10 Essential (primary) hypertension: Secondary | ICD-10-CM | POA: Diagnosis not present

## 2016-08-14 DIAGNOSIS — Z944 Liver transplant status: Secondary | ICD-10-CM | POA: Insufficient documentation

## 2016-08-14 DIAGNOSIS — Z8673 Personal history of transient ischemic attack (TIA), and cerebral infarction without residual deficits: Secondary | ICD-10-CM | POA: Insufficient documentation

## 2016-08-14 DIAGNOSIS — N136 Pyonephrosis: Secondary | ICD-10-CM | POA: Diagnosis not present

## 2016-08-14 DIAGNOSIS — Z79899 Other long term (current) drug therapy: Secondary | ICD-10-CM | POA: Insufficient documentation

## 2016-08-14 DIAGNOSIS — F329 Major depressive disorder, single episode, unspecified: Secondary | ICD-10-CM | POA: Diagnosis not present

## 2016-08-14 DIAGNOSIS — N202 Calculus of kidney with calculus of ureter: Secondary | ICD-10-CM | POA: Diagnosis not present

## 2016-08-14 DIAGNOSIS — Z7982 Long term (current) use of aspirin: Secondary | ICD-10-CM | POA: Diagnosis not present

## 2016-08-14 DIAGNOSIS — B1920 Unspecified viral hepatitis C without hepatic coma: Secondary | ICD-10-CM | POA: Insufficient documentation

## 2016-08-14 DIAGNOSIS — Z466 Encounter for fitting and adjustment of urinary device: Secondary | ICD-10-CM | POA: Diagnosis not present

## 2016-08-14 DIAGNOSIS — G43909 Migraine, unspecified, not intractable, without status migrainosus: Secondary | ICD-10-CM | POA: Diagnosis not present

## 2016-08-14 HISTORY — PX: CYSTOSCOPY W/ URETERAL STENT PLACEMENT: SHX1429

## 2016-08-14 LAB — BASIC METABOLIC PANEL
Anion gap: 9 (ref 5–15)
BUN: 17 mg/dL (ref 6–20)
CALCIUM: 9.1 mg/dL (ref 8.9–10.3)
CO2: 26 mmol/L (ref 22–32)
Chloride: 97 mmol/L — ABNORMAL LOW (ref 101–111)
Creatinine, Ser: 1.32 mg/dL — ABNORMAL HIGH (ref 0.44–1.00)
GFR calc non Af Amer: 40 mL/min — ABNORMAL LOW (ref >60)
GFR, EST AFRICAN AMERICAN: 47 mL/min — AB (ref >60)
Glucose, Bld: 117 mg/dL — ABNORMAL HIGH (ref 65–99)
Potassium: 4.1 mmol/L (ref 3.5–5.1)
Sodium: 132 mmol/L — ABNORMAL LOW (ref 135–145)

## 2016-08-14 SURGERY — CYSTOSCOPY, WITH RETROGRADE PYELOGRAM AND URETERAL STENT INSERTION
Anesthesia: General | Site: Ureter | Laterality: Left

## 2016-08-14 MED ORDER — CIPROFLOXACIN IN D5W 400 MG/200ML IV SOLN
400.0000 mg | INTRAVENOUS | Status: AC
  Administered 2016-08-14: 400 mg via INTRAVENOUS
  Filled 2016-08-14: qty 200

## 2016-08-14 MED ORDER — DOCUSATE SODIUM 100 MG PO CAPS
100.0000 mg | ORAL_CAPSULE | Freq: Two times a day (BID) | ORAL | Status: DC
  Administered 2016-08-14 – 2016-08-15 (×3): 100 mg via ORAL
  Filled 2016-08-14 (×3): qty 1

## 2016-08-14 MED ORDER — CIPROFLOXACIN IN D5W 400 MG/200ML IV SOLN
400.0000 mg | Freq: Two times a day (BID) | INTRAVENOUS | Status: DC
  Administered 2016-08-15 – 2016-08-16 (×3): 400 mg via INTRAVENOUS
  Filled 2016-08-14 (×3): qty 200

## 2016-08-14 MED ORDER — TRAMADOL HCL 50 MG PO TABS: 50.0000 mg | ORAL_TABLET | Freq: Four times a day (QID) | ORAL | 0 refills | Status: DC | PRN

## 2016-08-14 MED ORDER — FENTANYL CITRATE (PF) 100 MCG/2ML IJ SOLN
INTRAMUSCULAR | Status: DC | PRN
  Administered 2016-08-14: 50 ug via INTRAVENOUS

## 2016-08-14 MED ORDER — BRIMONIDINE TARTRATE 0.15 % OP SOLN
1.0000 [drp] | Freq: Two times a day (BID) | OPHTHALMIC | Status: DC
  Administered 2016-08-14 – 2016-08-15 (×3): 1 [drp] via OPHTHALMIC
  Filled 2016-08-14: qty 5

## 2016-08-14 MED ORDER — ONDANSETRON HCL 4 MG/2ML IJ SOLN
INTRAMUSCULAR | Status: DC | PRN
  Administered 2016-08-14: 4 mg via INTRAVENOUS

## 2016-08-14 MED ORDER — DIPHENHYDRAMINE HCL 12.5 MG/5ML PO ELIX: 12.5000 mg | ORAL_SOLUTION | Freq: Four times a day (QID) | ORAL | Status: DC | PRN

## 2016-08-14 MED ORDER — ACETAMINOPHEN 325 MG PO TABS
650.0000 mg | ORAL_TABLET | ORAL | Status: DC | PRN
  Administered 2016-08-15: 650 mg via ORAL
  Filled 2016-08-14: qty 2

## 2016-08-14 MED ORDER — DEXAMETHASONE SODIUM PHOSPHATE 10 MG/ML IJ SOLN
INTRAMUSCULAR | Status: AC
  Filled 2016-08-14: qty 1

## 2016-08-14 MED ORDER — MIDAZOLAM HCL 5 MG/5ML IJ SOLN
INTRAMUSCULAR | Status: DC | PRN
  Administered 2016-08-14: 1 mg via INTRAVENOUS

## 2016-08-14 MED ORDER — LACTATED RINGERS IV SOLN
INTRAVENOUS | Status: DC
  Administered 2016-08-14: 17:00:00 via INTRAVENOUS

## 2016-08-14 MED ORDER — ACETAMINOPHEN 500 MG PO TABS: 500.0000 mg | ORAL_TABLET | Freq: Four times a day (QID) | ORAL | Status: DC | PRN

## 2016-08-14 MED ORDER — PHENAZOPYRIDINE HCL 100 MG PO TABS: 100.0000 mg | ORAL_TABLET | Freq: Three times a day (TID) | ORAL | 0 refills | Status: DC | PRN

## 2016-08-14 MED ORDER — ONDANSETRON HCL 4 MG/2ML IJ SOLN
4.0000 mg | INTRAMUSCULAR | Status: DC | PRN
  Administered 2016-08-15: 4 mg via INTRAVENOUS
  Filled 2016-08-14: qty 2

## 2016-08-14 MED ORDER — PROPRANOLOL HCL ER 80 MG PO CP24
80.0000 mg | ORAL_CAPSULE | Freq: Every day | ORAL | Status: DC
  Administered 2016-08-14 – 2016-08-15 (×2): 80 mg via ORAL
  Filled 2016-08-14 (×3): qty 1

## 2016-08-14 MED ORDER — TAMSULOSIN HCL 0.4 MG PO CAPS
0.4000 mg | ORAL_CAPSULE | Freq: Every day | ORAL | Status: DC
  Administered 2016-08-14 – 2016-08-15 (×2): 0.4 mg via ORAL
  Filled 2016-08-14 (×2): qty 1

## 2016-08-14 MED ORDER — IOHEXOL 300 MG/ML  SOLN
INTRAMUSCULAR | Status: DC | PRN
  Administered 2016-08-14: 5 mL

## 2016-08-14 MED ORDER — KCL IN DEXTROSE-NACL 20-5-0.45 MEQ/L-%-% IV SOLN
INTRAVENOUS | Status: DC
  Administered 2016-08-14 – 2016-08-16 (×2): via INTRAVENOUS
  Filled 2016-08-14 (×4): qty 1000

## 2016-08-14 MED ORDER — PROPOFOL 10 MG/ML IV BOLUS
INTRAVENOUS | Status: DC | PRN
  Administered 2016-08-14: 200 mg via INTRAVENOUS

## 2016-08-14 MED ORDER — POTASSIUM CITRATE ER 10 MEQ (1080 MG) PO TBCR
10.0000 meq | EXTENDED_RELEASE_TABLET | Freq: Two times a day (BID) | ORAL | Status: DC
  Administered 2016-08-14 – 2016-08-15 (×3): 10 meq via ORAL
  Filled 2016-08-14 (×4): qty 1

## 2016-08-14 MED ORDER — PROPRANOLOL HCL 80 MG PO TABS
80.0000 mg | ORAL_TABLET | ORAL | Status: AC
  Administered 2016-08-14: 80 mg via ORAL
  Filled 2016-08-14: qty 1

## 2016-08-14 MED ORDER — TACROLIMUS 1 MG PO CAPS
1.5000 mg | ORAL_CAPSULE | Freq: Two times a day (BID) | ORAL | Status: DC
  Administered 2016-08-14 – 2016-08-15 (×3): 1.5 mg via ORAL
  Filled 2016-08-14 (×4): qty 1

## 2016-08-14 MED ORDER — CIPROFLOXACIN HCL 500 MG PO TABS: 500.0000 mg | ORAL_TABLET | Freq: Two times a day (BID) | ORAL | 0 refills | Status: DC

## 2016-08-14 MED ORDER — VITAMIN B-6 100 MG PO TABS
100.0000 mg | ORAL_TABLET | Freq: Every day | ORAL | Status: DC
  Administered 2016-08-14 – 2016-08-15 (×2): 100 mg via ORAL
  Filled 2016-08-14 (×3): qty 1

## 2016-08-14 MED ORDER — PROPOFOL 10 MG/ML IV BOLUS
INTRAVENOUS | Status: AC
  Filled 2016-08-14: qty 20

## 2016-08-14 MED ORDER — 0.9 % SODIUM CHLORIDE (POUR BTL) OPTIME
TOPICAL | Status: DC | PRN
  Administered 2016-08-14: 4000 mL

## 2016-08-14 MED ORDER — FERROUS SULFATE 325 (65 FE) MG PO TABS
325.0000 mg | ORAL_TABLET | Freq: Every day | ORAL | Status: DC
  Administered 2016-08-15: 325 mg via ORAL
  Filled 2016-08-14: qty 1

## 2016-08-14 MED ORDER — DULOXETINE HCL 60 MG PO CPEP
60.0000 mg | ORAL_CAPSULE | Freq: Every day | ORAL | Status: DC
  Administered 2016-08-14 – 2016-08-15 (×2): 60 mg via ORAL
  Filled 2016-08-14 (×2): qty 1

## 2016-08-14 MED ORDER — FOLIC ACID 0.5 MG HALF TAB
500.0000 ug | ORAL_TABLET | Freq: Every day | ORAL | Status: DC
  Administered 2016-08-14 – 2016-08-15 (×2): 0.5 mg via ORAL
  Filled 2016-08-14 (×3): qty 1

## 2016-08-14 MED ORDER — ASPIRIN EC 81 MG PO TBEC
81.0000 mg | DELAYED_RELEASE_TABLET | Freq: Every day | ORAL | Status: DC
  Administered 2016-08-14 – 2016-08-15 (×2): 81 mg via ORAL
  Filled 2016-08-14 (×2): qty 1

## 2016-08-14 MED ORDER — RIZATRIPTAN BENZOATE 5 MG PO TBDP: 5.0000 mg | ORAL_TABLET | ORAL | Status: DC | PRN

## 2016-08-14 MED ORDER — MIDAZOLAM HCL 2 MG/2ML IJ SOLN
INTRAMUSCULAR | Status: AC
  Filled 2016-08-14: qty 2

## 2016-08-14 MED ORDER — FENTANYL CITRATE (PF) 100 MCG/2ML IJ SOLN
INTRAMUSCULAR | Status: AC
  Filled 2016-08-14: qty 2

## 2016-08-14 MED ORDER — PHENAZOPYRIDINE HCL 200 MG PO TABS: 200.0000 mg | ORAL_TABLET | Freq: Three times a day (TID) | ORAL | Status: DC | PRN

## 2016-08-14 MED ORDER — HYDROMORPHONE HCL 1 MG/ML IJ SOLN: 0.2500 mg | INTRAMUSCULAR | Status: DC | PRN

## 2016-08-14 MED ORDER — SUMATRIPTAN SUCCINATE 25 MG PO TABS: 25.0000 mg | ORAL_TABLET | ORAL | Status: DC | PRN

## 2016-08-14 MED ORDER — DIPHENHYDRAMINE HCL 50 MG/ML IJ SOLN: 12.5000 mg | Freq: Four times a day (QID) | INTRAMUSCULAR | Status: DC | PRN

## 2016-08-14 MED ORDER — TRAMADOL HCL 50 MG PO TABS
50.0000 mg | ORAL_TABLET | Freq: Four times a day (QID) | ORAL | Status: DC | PRN
  Administered 2016-08-15 (×2): 50 mg via ORAL
  Filled 2016-08-14 (×2): qty 1

## 2016-08-14 MED ORDER — VITAMIN D (ERGOCALCIFEROL) 1.25 MG (50000 UNIT) PO CAPS: 50000.0000 [IU] | ORAL_CAPSULE | ORAL | Status: DC

## 2016-08-14 SURGICAL SUPPLY — 10 items
BAG URO CATCHER STRL LF (MISCELLANEOUS) ×2 IMPLANT
CATH INTERMIT  6FR 70CM (CATHETERS) ×2 IMPLANT
CLOTH BEACON ORANGE TIMEOUT ST (SAFETY) ×2 IMPLANT
GLOVE BIOGEL M STRL SZ7.5 (GLOVE) ×2 IMPLANT
GOWN STRL REUS W/TWL LRG LVL3 (GOWN DISPOSABLE) ×4 IMPLANT
GUIDEWIRE STR DUAL SENSOR (WIRE) ×2 IMPLANT
MANIFOLD NEPTUNE II (INSTRUMENTS) ×2 IMPLANT
PACK CYSTO (CUSTOM PROCEDURE TRAY) ×2 IMPLANT
STENT URET 6FRX24 CONTOUR (STENTS) ×2 IMPLANT
TUBING CONNECTING 10 (TUBING) ×2 IMPLANT

## 2016-08-14 NOTE — H&P (Signed)
@media Print    Office Visit Report 08/14/2016    Candace Hill         MRN: [id number]  PRIMARY CARE:  Candace R. Gates, MD  DOB: 03/29/1946, 71 year old Female  REFERRING:  Candace Walberg, MD  SSN: [id number]  PROVIDER:  Ahnna Hill, M.D.    LOCATION:  [facility] - [id number]    CC/HPI: Left renal calculi   Candace Hill returns today for follow-up of her large burden left renal calculi. She underwent a two-stage ureteroscopic laser lithotripsy procedure after reviewing options for treatment of her calculi. She did wish to avoid a percutaneous nephrolithotomy considering her immunosuppression and history of liver transplant. Her second procedure was performed approximately 1 week ago. She removed her stent on Tuesday. She did develop severe left-sided abdominal pain with associated nausea on Tuesday evening. Her pain has gradually improved but she has continued having what she describes as severe bladder spasms and intermittent mild to severe nausea. She has had some hematuria. She denies any subjective fever. She follows up today with a KUB and renal ultrasound.     ALLERGIES: Augmentin TABS - Nausea Codeine Derivatives - Nausea Lidocaine HCl (PF) SOLN - Other Reaction, palpations novacaine - Other Reaction, palpations Penicillins - Other Reaction, unknown Phenergan TABS - Other Reaction, hallucination Sulfa Drugs - Nausea   MEDICATIONS: Potassium Citrate Er 10 meq (1,080 mg) tablet, extended release TAKE 1 TABLET BY MOUTH EVERY MORNING AND 2 TABLETS BY MOUTH EVERY EVENING  Tamsulosin Hcl 0.4 mg capsule, ext release 24 hr 1 capsule PO Q HS  Tamsulosin Hcl 0.4 mg capsule, ext release 24 hr 1 capsule PO Q HS  Tramadol Hcl 50 mg tablet 1 tablet PO Q 4-6 H PRN  Aspirin Ec 81 mg tablet, delayed release  Cymbalta 60 mg capsule,delayed release Oral  Prograf 0.5 mg capsule 0 Oral  Propranolol Hcl 80 mg tablet Oral  Rizatriptan 5 mg tablet Oral    GU PSH: Cystoscopy -  06/17/2016 ESWL - 2014 Ureteroscopic laser litho, Left - 08/06/2016, Left - 07/06/2016   NON-GU PSH: Cholecystectomy (open) Hernia Repair - 2011 Sinus Surgery.. Transplant Liver        GU PMH: Gross hematuria, Culture urine. No ABX unless culture proven UTI. - 05/13/2016 Kidney Stone, Nephrolithiasis - 10/29/2015 Urinary Tract Inf, Unspec site, Urinary tract infection - 04/04/2015 Abnormal urine findings, Unspec, Abnormal urine findings - 2014 Nocturia, Nocturia - 2014 Renal Cysts, Simple, Renal cysts, acquired, bilateral - 2014      PMH Notes:   1) Urolithiasis: She has a history of uric acid and calcium oxalate urolithiasis and has required lithotripsy in the past.   Current treatment: Potassium citrate 20 mEq po bid   Jan 2018: L ureteroscopic laser lithotripsy for large burden left renal calculi  Feb 2018: 2nd stage left ureteroscopic laser lithotripsy   2) Atypical cytology: She was incidentally noted to have some thickening of the left renal pelvis on a CT scan in June 2014 at which time she had a symptomatic left UPJ calculus. This was very consistent with inflammation considering it was in the location of her stone. However, the radiologist read this as possibly worrisome for a urothelial tumor among the differential diagnoses. She had a followup CT scan in July which demonstrated resolution of the renal pelvic abnormality. A urine cytology was performed at the same time and demonstrated atypical cells. Another followup urine cytology in September was also atypical but a reflex FISH   analysis was negative. It was felt that her initial findings were likely inflammatory considering the resolution of these findings on follow up imaging.   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 She is currently undergoing therapy for treatment of hepatitis C which she contracted during a blood transfusion.     NON-GU PMH: Bacteriuria -  06/17/2016 Cerebral infarction, unspecified, Mini stroke - 04/04/2015 Asthma, Asthma - 2014 Hepatitis C, Chronic Hepatitis, C Virus - 2014 Personal history of other diseases of the nervous system and sense organs, History of glaucoma - 2014 Personal history of other mental and behavioral disorders, History of depression - 2014 Unspecified cirrhosis of liver, Cirrhosis - 2014 Liver Cancer, History Stroke/TIA    FAMILY HISTORY: No Significant Family History - Runs In Family Strokes - Runs In Family   SOCIAL HISTORY: Marital Status: Divorced Current Smoking Status: Patient has never smoked.  Does not drink anymore.  Does not drink caffeine.    REVIEW OF SYSTEMS:     GU Review Female:  Patient reports frequent urination, hard to postpone urination, get up at night to urinate, leakage of urine, stream starts and stops, trouble starting your stream, and have to strain to urinate. Patient denies burning /pain with urination and currently pregnant.    Gastrointestinal (Upper):  Patient reports nausea. Patient denies vomiting.    Gastrointestinal (Lower):  Patient denies diarrhea and constipation.    Constitutional:  Patient denies fever, night sweats, weight loss, and fatigue.    Skin:  Patient denies skin rash/ lesion and itching.    Eyes:  Patient denies blurred vision and double vision.    Ears/ Nose/ Throat:  Patient denies sore throat and sinus problems.    Hematologic/Lymphatic:  Patient denies swollen glands and easy bruising.    Cardiovascular:  Patient denies leg swelling and chest pains.    Respiratory:  Patient denies cough and shortness of breath.    Endocrine:  Patient denies excessive thirst.    Musculoskeletal:  Patient denies back pain and joint pain.    Neurological:  Patient denies headaches and dizziness.    Psychologic:  Patient denies depression and anxiety.    VITAL SIGNS:       08/14/2016 08:38 AM     Weight 154 lb / 69.85 kg     Height 63 in / 160.02 cm     BP 126/72  mmHg     Pulse 90 /min     Temperature 99.9 F / 38 C     BMI 27.3 kg/m     MULTI-SYSTEM PHYSICAL EXAMINATION:      Constitutional: Well-nourished. No physical deformities. Normally developed. Good grooming.     Neck: Neck symmetrical, not swollen. Normal tracheal position.     Respiratory: No labored breathing, no use of accessory muscles. Clear bilaterally.     Cardiovascular: Normal temperature, normal extremity pulses, no swelling, no varicosities. Regular rate and rhythm.     Lymphatic: No enlargement of neck, axillae, groin.     Skin: No paleness, no jaundice, no cyanosis. No lesion, no ulcer, no rash.     Neurologic / Psychiatric: Oriented to time, oriented to place, oriented to person. No depression, no anxiety, no agitation.     Gastrointestinal: No CVA tenderness.     Eyes: Normal conjunctivae. Normal eyelids.     Ears, Nose, Mouth, and Throat: Left ear no scars, no lesions, no masses. Right ear no scars, no lesions, no masses. Nose   no scars, no lesions, no masses. Normal hearing. Normal lips.     Musculoskeletal: Normal gait and station of head and neck.            PAST DATA REVIEWED:   Source Of History:  Patient  Urine Test Review:  Urinalysis  X-Ray Review: KUB: Reviewed Films. I independently reviewed her KUB x-ray. This demonstrates a significant reduction of the left renal stone burden. However, she does appear to have multiple stone fragments within the distal left ureter. Renal Ultrasound (Limited): Reviewed Films. I reviewed her left renal ultrasound. This demonstrates moderate left hydronephrosis with a dilated renal pelvis and proximal ureter. She does have multiple small echogenic foci consistent with nonobstructing renal calculi.    Notes:  Urine will be cultured.   PROCEDURES:    Renal Ultrasound (Limited) - 76775  Left Kidney: Length: 11.89 cm Depth: 4.61 cm Cortical Width: 0.70 cm Width: 4.94 cm    Left Kidney/Ureter:  1)Moderate Hydro with Dilated  Renal Pelvis measuring 3.26cm and Dilated Proximal Ureter measuring 046cm-----2)Lower Pole Stone (1 vs 2) measuring1.11cm-----3)Upper Pole Hyperechoic Areas measuring 0.43cm and 0.54cm - ? Stones-----4)0.86cm Mid Pole Hyperechoic Area - ? stone  Bladder:  PVR = bladder not visualized           KUB - 74018  A single view of the abdomen is obtained.          Urinalysis w/Scope - 81001  Dipstick Dipstick Cont'd Micro  Color: Yellow Bilirubin: Neg WBC/hpf: >60/hpf  Appearance: Turbid Ketones: Neg RBC/hpf: 3 - 10/hpf  Specific Gravity: 1.020 Blood: 3+ Bacteria: Mod (26-50/hpf)  pH: 6.5 Protein: 2+ Cystals: NS (Not Seen)  Glucose: Neg Urobilinogen: 0.2 Casts: NS (Not Seen)   Nitrites: Positive Trichomonas: Not Present   Leukocyte Esterase: 3+ Mucous: Not Present    Epithelial Cells: 0 - 5/hpf    Yeast: NS (Not Seen)    Sperm: Not Present   Notes:  microscopic performed on unconcentrated urine      ASSESSMENT:     ICD-10 Details  1 GU:  Kidney Stone - N20.0   2  Calculus Ureter - N20.1    PLAN:   Orders  Labs Urine Culture and Sensitivity  Schedule  Return Visit/Planned Activity: Other See Visit Notes  Note: She is scheduled for surgery later today.  Document  Letter(s):  Created for Patient: Clinical Summary   Notes:  1. Left renal and ureteral calculi: Considering her large burden of left ureteral calculi, as well as her history of immunosuppression and abnormal urinalysis, I have recommended that she proceed with cystoscopy and left ureteral stent placement today. If she does not have any obvious signs of infection, we also may plan to attempt to remove her left ureteral calculi although I will have a low threshold for simply placing a ureteral stent if she has any signs of obvious infection especially considering her immunosuppression. We have reviewed this procedure in detail including the potential risks, complications, and expected recovery process. She fully  understands that she may require a second procedure for removal of her stones if there is concern for active infection. She has been given a tablet of Cipro 500 mg to begin taking. Her urine has been cultured. Her surgery is scheduled for 5 PM today. She will notify should she develop fever prior to her planned surgery time.   2. Recurrent urolithiasis: Following resolution of her acute stone episode, we will proceed with a full metabolic evaluation in order to discuss   preventative recommendations going forward. Her stone type has been calcium oxalate monohydrate.   Cc: Dr. Donna Hill      

## 2016-08-14 NOTE — Discharge Instructions (Signed)

## 2016-08-14 NOTE — Op Note (Signed)
Preoperative diagnosis:  1. Left ureteral calculi, fever   Postoperative diagnosis:  1. Left ureteral calculi, fever   Procedure:  1. Cystoscopy 2. Left ureteral stent placement (6 x 24 - no string)  3. Left retrograde pyelography with interpretation   Surgeon: Pryor Curia. M.D.  Anesthesia: General  Complications: None  EBL: Minimal  Specimens: None  Indication: Candace Hill is a 71 y.o. patient with large burden left renal calculi s/p staged ureteroscopic treatment.  She removed her stent earlier this week and presented for follow up today with continued left flank pain and nausea.  Imaging demonstrated multiple left ureteral calculi and hydronephrosis. She was noted to have low grade fever and a possible UTI and she is immunocompromised due to her immunosuppression s/p liver transplant. After reviewing the management options for treatment, she elected to proceed with the above surgical procedure(s). We have discussed the potential benefits and risks of the procedure, side effects of the proposed treatment, the likelihood of the patient achieving the goals of the procedure, and any potential problems that might occur during the procedure or recuperation. Informed consent has been obtained.  Description of procedure:  The patient was taken to the operating room and general anesthesia was induced.  The patient was placed in the dorsal lithotomy position, prepped and draped in the usual sterile fashion, and preoperative antibiotics were administered. A preoperative time-out was performed.   Cystourethroscopy was performed.  The patient's urethra was examined and was normal. The bladder was then systematically examined in its entirety. There was no evidence for any bladder tumors, stones, or other mucosal pathology.    Attention then turned to the left ureteral orifice and a ureteral catheter was used to intubate the ureteral orifice.    A 0.38 sensor guidewire was then  advanced up the left ureter into the renal pelvis under fluoroscopic guidance.  A ureteral catheter was advanced over the wire into the renal pelvis.  A small amount of omnipaque contrast was injected through the ureteral catheter to confirm proper placement in the renal pelvis.  There was noted to be moderate hydronephrosis on retrograde pyelography although the entire renal collecting system was not filled out to avoid backpressure in the kidney.  The wire was then backloaded through the cystoscope and a ureteral stent was advance over the wire using Seldinger technique.  The stent was positioned appropriately under fluoroscopic and cystoscopic guidance.  The wire was then removed with an adequate stent curl noted in the renal pelvis as well as in the bladder.  The bladder was then emptied and the procedure ended.  The patient appeared to tolerate the procedure well and without complications.  The patient was able to be awakened and transferred to the recovery unit in satisfactory condition.    Pryor Curia MD

## 2016-08-14 NOTE — Anesthesia Preprocedure Evaluation (Addendum)
Anesthesia Evaluation  Patient identified by MRN, date of birth, ID band Patient awake    Reviewed: Allergy & Precautions, NPO status , Patient's Chart, lab work & pertinent test results  History of Anesthesia Complications Negative for: history of anesthetic complications  Airway Mallampati: II  TM Distance: >3 FB Neck ROM: Full    Dental no notable dental hx. (+) Dental Advisory Given   Pulmonary neg pulmonary ROS,    Pulmonary exam normal        Cardiovascular hypertension, Normal cardiovascular exam  Impressions:  - Normal LV systolic function; grade 1 diastolic dysfunction;   trace TR.   Neuro/Psych PSYCHIATRIC DISORDERS Depression TIAnegative neurological ROS     GI/Hepatic GERD  ,(+) Hepatitis -, C  Endo/Other  negative endocrine ROS  Renal/GU Renal disease     Musculoskeletal   Abdominal   Peds  Hematology   Anesthesia Other Findings   Reproductive/Obstetrics                            Anesthesia Physical Anesthesia Plan  ASA: III  Anesthesia Plan: General   Post-op Pain Management:    Induction: Intravenous  Airway Management Planned: LMA and Oral ETT  Additional Equipment:   Intra-op Plan:   Post-operative Plan: Extubation in OR  Informed Consent: I have reviewed the patients History and Physical, chart, labs and discussed the procedure including the risks, benefits and alternatives for the proposed anesthesia with the patient or authorized representative who has indicated his/her understanding and acceptance.   Dental advisory given  Plan Discussed with: Anesthesiologist and CRNA  Anesthesia Plan Comments:        Anesthesia Quick Evaluation                                   Anesthesia Evaluation  Patient identified by MRN, date of birth, ID band Patient awake    Reviewed: Allergy & Precautions, NPO status , Patient's Chart, lab work & pertinent  test results  Airway Mallampati: II  TM Distance: >3 FB Neck ROM: Full    Dental no notable dental hx.    Pulmonary neg pulmonary ROS,    Pulmonary exam normal breath sounds clear to auscultation       Cardiovascular hypertension, Normal cardiovascular exam Rhythm:Regular Rate:Normal     Neuro/Psych TIAnegative psych ROS   GI/Hepatic negative GI ROS, Neg liver ROS, (+) Hepatitis -, C  Endo/Other  negative endocrine ROS  Renal/GU negative Renal ROS  negative genitourinary   Musculoskeletal negative musculoskeletal ROS (+)   Abdominal   Peds negative pediatric ROS (+)  Hematology negative hematology ROS (+)   Anesthesia Other Findings   Reproductive/Obstetrics negative OB ROS                             Anesthesia Physical Anesthesia Plan  ASA: III  Anesthesia Plan: General   Post-op Pain Management:    Induction: Intravenous  Airway Management Planned: LMA  Additional Equipment:   Intra-op Plan:   Post-operative Plan: Extubation in OR  Informed Consent: I have reviewed the patients History and Physical, chart, labs and discussed the procedure including the risks, benefits and alternatives for the proposed anesthesia with the patient or authorized representative who has indicated his/her understanding and acceptance.   Dental advisory given  Plan Discussed with: CRNA and  Surgeon  Anesthesia Plan Comments:         Anesthesia Quick Evaluation                                   Anesthesia Evaluation  Patient identified by MRN, date of birth, ID band Patient awake    Reviewed: Allergy & Precautions, NPO status , Patient's Chart, lab work & pertinent test results  Airway Mallampati: II  TM Distance: >3 FB Neck ROM: Full    Dental no notable dental hx.    Pulmonary neg pulmonary ROS,    Pulmonary exam normal breath sounds clear to auscultation       Cardiovascular hypertension, Normal  cardiovascular exam Rhythm:Regular Rate:Normal     Neuro/Psych TIAnegative psych ROS   GI/Hepatic negative GI ROS, Neg liver ROS, (+) Hepatitis -, C  Endo/Other  negative endocrine ROS  Renal/GU negative Renal ROS  negative genitourinary   Musculoskeletal negative musculoskeletal ROS (+)   Abdominal   Peds negative pediatric ROS (+)  Hematology negative hematology ROS (+)   Anesthesia Other Findings   Reproductive/Obstetrics negative OB ROS                             Anesthesia Physical Anesthesia Plan  ASA: III  Anesthesia Plan: General   Post-op Pain Management:    Induction: Intravenous  Airway Management Planned: LMA  Additional Equipment:   Intra-op Plan:   Post-operative Plan: Extubation in OR  Informed Consent: I have reviewed the patients History and Physical, chart, labs and discussed the procedure including the risks, benefits and alternatives for the proposed anesthesia with the patient or authorized representative who has indicated his/her understanding and acceptance.   Dental advisory given  Plan Discussed with: CRNA and Surgeon  Anesthesia Plan Comments:         Anesthesia Quick Evaluation

## 2016-08-14 NOTE — Transfer of Care (Signed)
Immediate Anesthesia Transfer of Care Note  Patient: Candace Hill  Procedure(s) Performed: Procedure(s): CYSTOSCOPY WITH RETROGRADE PYELOGRAM/URETERAL LEFT URETEROSCOPY AND LEFT STENT PLACEMENT (Left)  Patient Location: PACU  Anesthesia Type:General  Level of Consciousness:  sedated, patient cooperative and responds to stimulation  Airway & Oxygen Therapy:Patient Spontanous Breathing and Patient connected to face mask oxgen  Post-op Assessment:  Report given to PACU RN and Post -op Vital signs reviewed and stable  Post vital signs:  Reviewed and stable  Last Vitals:  Vitals:   08/14/16 1810 08/14/16 1811  BP:    Pulse:  75  Resp: (!) 23 19  Temp:      Complications: No apparent anesthesia complications

## 2016-08-14 NOTE — Anesthesia Procedure Notes (Signed)
Procedure Name: LMA Insertion Date/Time: 08/14/2016 5:35 PM Performed by: West Pugh Pre-anesthesia Checklist: Patient identified, Emergency Drugs available, Suction available, Patient being monitored and Timeout performed Patient Re-evaluated:Patient Re-evaluated prior to inductionOxygen Delivery Method: Circle system utilized Preoxygenation: Pre-oxygenation with 100% oxygen Intubation Type: IV induction Ventilation: Mask ventilation without difficulty LMA: LMA with gastric port inserted LMA Size: 4.0 Laser Tube: Cuffed inflated with minimal occlusive pressure - saline Placement Confirmation: positive ETCO2,  CO2 detector and breath sounds checked- equal and bilateral Tube secured with: Tape Dental Injury: Teeth and Oropharynx as per pre-operative assessment

## 2016-08-14 NOTE — Anesthesia Postprocedure Evaluation (Signed)
Anesthesia Post Note  Patient: Candace Hill  Procedure(s) Performed: Procedure(s) (LRB): CYSTOSCOPY WITH RETROGRADE PYELOGRAM/URETERAL LEFT URETEROSCOPY AND LEFT STENT PLACEMENT (Left)  Patient location during evaluation: PACU Anesthesia Type: General Level of consciousness: sedated Pain management: pain level controlled Vital Signs Assessment: post-procedure vital signs reviewed and stable Respiratory status: spontaneous breathing and respiratory function stable Cardiovascular status: stable Anesthetic complications: no       Last Vitals:  Vitals:   08/14/16 1845 08/14/16 1900  BP: (!) 102/56 128/66  Pulse: 75 74  Resp: 16 16  Temp: 37.3 C 37.6 C    Last Pain:  Vitals:   08/14/16 1650  TempSrc:   PainSc: 0-No pain                 Aseel Uhde DANIEL

## 2016-08-15 DIAGNOSIS — B1920 Unspecified viral hepatitis C without hepatic coma: Secondary | ICD-10-CM | POA: Diagnosis not present

## 2016-08-15 DIAGNOSIS — Z944 Liver transplant status: Secondary | ICD-10-CM | POA: Diagnosis not present

## 2016-08-15 DIAGNOSIS — Z7982 Long term (current) use of aspirin: Secondary | ICD-10-CM | POA: Diagnosis not present

## 2016-08-15 DIAGNOSIS — I10 Essential (primary) hypertension: Secondary | ICD-10-CM | POA: Diagnosis not present

## 2016-08-15 DIAGNOSIS — Z8673 Personal history of transient ischemic attack (TIA), and cerebral infarction without residual deficits: Secondary | ICD-10-CM | POA: Diagnosis not present

## 2016-08-15 DIAGNOSIS — N136 Pyonephrosis: Secondary | ICD-10-CM | POA: Diagnosis not present

## 2016-08-15 LAB — BASIC METABOLIC PANEL
ANION GAP: 8 (ref 5–15)
BUN: 13 mg/dL (ref 6–20)
CO2: 24 mmol/L (ref 22–32)
Calcium: 8.7 mg/dL — ABNORMAL LOW (ref 8.9–10.3)
Chloride: 99 mmol/L — ABNORMAL LOW (ref 101–111)
Creatinine, Ser: 1.31 mg/dL — ABNORMAL HIGH (ref 0.44–1.00)
GFR, EST AFRICAN AMERICAN: 47 mL/min — AB (ref >60)
GFR, EST NON AFRICAN AMERICAN: 41 mL/min — AB (ref >60)
Glucose, Bld: 139 mg/dL — ABNORMAL HIGH (ref 65–99)
POTASSIUM: 4.3 mmol/L (ref 3.5–5.1)
Sodium: 131 mmol/L — ABNORMAL LOW (ref 135–145)

## 2016-08-15 LAB — CBC
HCT: 32.9 % — ABNORMAL LOW (ref 36.0–46.0)
Hemoglobin: 11.6 g/dL — ABNORMAL LOW (ref 12.0–15.0)
MCH: 30.6 pg (ref 26.0–34.0)
MCHC: 35.3 g/dL (ref 30.0–36.0)
MCV: 86.8 fL (ref 78.0–100.0)
Platelets: 163 10*3/uL (ref 150–400)
RBC: 3.79 MIL/uL — AB (ref 3.87–5.11)
RDW: 13.1 % (ref 11.5–15.5)
WBC: 5.7 10*3/uL (ref 4.0–10.5)

## 2016-08-15 NOTE — Care Management Note (Addendum)
Case Management Note  Patient Details  Name: Candace Hill MRN: [id number] Date of Birth: 09-06-1945  Subjective/Objective:    Pyelonephritis, Left Ureteral Stone s/p left ureteral stent 08/14/2016                Action/Plan: Discharge Planning: NCM spoke to pt and lives at home alone. Pt was independent prior to hospital stay. Will continue to follow for dc planning. Pt will need 3n1 bedside commode for home.   PCP  Darcus Austin MD  Expected Discharge Date:  08/14/16               Expected Discharge Plan:  Home/Self Care  In-House Referral:  NA  Discharge planning Services  CM Consult  Post Acute Care Choice:  NA Choice offered to:  NA  DME Arranged:  N/A DME Agency:  NA  HH Arranged:  NA HH Agency:  NA  Status of Service:  Completed, signed off  If discussed at [place] of Stay Meetings, dates discussed:    Additional Comments:  Erenest Rasher, RN 08/15/2016, 2:34 PM

## 2016-08-15 NOTE — Progress Notes (Signed)
1 Day Post-Op  Subjective:  1 - Left Ureteral Stone - s/p LEFT ureteral stent 08/14/16 by Alinda Money for residual left rueteral stone in setting of early pyelonephritis.  2 - Pyelonephritis - fevers to 102, malaise, bacteruria on presentation c/w likely early pyelo in setting of left renal obstruction . UCX 3/2 pending. Placed on empiric Cipro (numerous allergies).  Today "Candace Hill" is improving. Fever curve appears to be improving, but still febrile last PM with 101.1 at 6pm and lower grade fevers since.   Objective: Vital signs in last 24 hours: Temp:  [97.7 F (36.5 C)-101.1 F (38.4 C)] 98.7 F (37.1 C) (03/03 0600) Pulse Rate:  [71-88] 71 (03/03 0600) Resp:  [16-23] 18 (03/03 0600) BP: (90-140)/(48-109) 98/60 (03/03 0629) SpO2:  [99 %-100 %] 99 % (03/03 0600) Weight:  [70.8 kg (156 lb)-74.3 kg (163 lb 12.8 oz)] 74.3 kg (163 lb 12.8 oz) (03/02 1900)    Intake/Output from previous day: 03/02 0701 - 03/03 0700 In: [place] [P.O.:600; I.V.:1240] Out: 250 [Urine:250] Intake/Output this shift: No intake/output data recorded.  General appearance: alert, cooperative and appears stated age Eyes: negative Nose: Nares normal. Septum midline. Mucosa normal. No drainage or sinus tenderness. Throat: lips, mucosa, and tongue normal; teeth and gums normal Neck: supple, symmetrical, trachea midline Back: symmetric, no curvature. ROM normal. No CVA tenderness. Resp: non-labored on room air.  Cardio: Nl rate GI: soft, non-tender; bowel sounds normal; no masses,  no organomegaly Extremities: extremities normal, atraumatic, no cyanosis or edema Pulses: 2+ and symmetric Skin: Skin color, texture, turgor normal. No rashes or lesions Lymph nodes: Cervical, supraclavicular, and axillary nodes normal. Neurologic: Mental status: some mild blunted affect, at baseline.   Lab Results:   Recent Labs  08/15/16 0529  WBC 5.7  HGB 11.6*  HCT 32.9*  PLT 163   BMET  Recent Labs  08/14/16 1635  08/15/16 0529  NA 132* 131*  K 4.1 4.3  CL 97* 99*  CO2 26 24  GLUCOSE 117* 139*  BUN 17 13  CREATININE 1.32* 1.31*  CALCIUM 9.1 8.7*   PT/INR No results for input(s): LABPROT, INR in the last 72 hours. ABG No results for input(s): PHART, HCO3 in the last 72 hours.  Invalid input(s): PCO2, PO2  Studies/Results: No results found.  Anti-infectives: Anti-infectives    Start     Dose/Rate Route Frequency Ordered Stop   08/15/16 0600  ciprofloxacin (CIPRO) IVPB 400 mg     400 mg 200 mL/hr over 60 Minutes Intravenous Every 12 hours 08/14/16 1906     08/14/16 1558  ciprofloxacin (CIPRO) IVPB 400 mg     400 mg 200 mL/hr over 60 Minutes Intravenous 60 min pre-op 08/14/16 1558 08/14/16 1807   08/14/16 0000  ciprofloxacin (CIPRO) 500 MG tablet     500 mg Oral 2 times daily 08/14/16 1807        Assessment/Plan:  1 - Left Ureteral Stone - now decompressed. No further treatment this admission.   2 - Pyelonephritis - continue current IV ABX pending further CX data. Rec pt remain in house on IV ABX until afebrile x 24 hrs as safest management, she is in agreement. Likely DC tomorrow AM.   Tresa Moore, Marshall Kampf 08/15/2016

## 2016-08-15 NOTE — Progress Notes (Signed)
NCM contacted [facility] DME rep to order 3n1 bedside commode for home. Will deliver to room prior to dc. Jonnie Finner RN CCM Case Mgmt phone [phone]

## 2016-08-15 NOTE — Care Management Obs Status (Signed)
[place] NOTIFICATION   Patient Details  Name: Candace Hill MRN: [id number] Date of Birth: 08-03-1945   Medicare Observation Status Notification Given:  Yes    Erenest Rasher, RN 08/15/2016, 2:30 PM

## 2016-08-16 DIAGNOSIS — Z7982 Long term (current) use of aspirin: Secondary | ICD-10-CM | POA: Diagnosis not present

## 2016-08-16 DIAGNOSIS — N136 Pyonephrosis: Secondary | ICD-10-CM | POA: Diagnosis not present

## 2016-08-16 DIAGNOSIS — B1920 Unspecified viral hepatitis C without hepatic coma: Secondary | ICD-10-CM | POA: Diagnosis not present

## 2016-08-16 DIAGNOSIS — I10 Essential (primary) hypertension: Secondary | ICD-10-CM | POA: Diagnosis not present

## 2016-08-16 DIAGNOSIS — Z8673 Personal history of transient ischemic attack (TIA), and cerebral infarction without residual deficits: Secondary | ICD-10-CM | POA: Diagnosis not present

## 2016-08-16 DIAGNOSIS — Z944 Liver transplant status: Secondary | ICD-10-CM | POA: Diagnosis not present

## 2016-08-16 LAB — URINE CULTURE

## 2016-08-16 MED ORDER — CLINDAMYCIN HCL 300 MG PO CAPS: 300.0000 mg | ORAL_CAPSULE | Freq: Three times a day (TID) | ORAL | 0 refills | Status: DC

## 2016-08-16 MED ORDER — VITAMINS A & D EX OINT
TOPICAL_OINTMENT | CUTANEOUS | Status: AC
  Administered 2016-08-16: 04:00:00
  Filled 2016-08-16: qty 5

## 2016-08-16 NOTE — Discharge Summary (Signed)
Physician Discharge Summary  Patient ID: Candace Hill MRN: [id number] DOB/AGE: 69-Jul-1947 69 y.o.  Admit date: 08/14/2016 Discharge date: 08/16/2016  Admission Diagnoses: Left ureteral stone, pyelonephritis  Discharge Diagnoses:  Active Problems:   Ureteral stone   Discharged Condition: good  Hospital Course:   1 - Left Ureteral Stone - s/p LEFT ureteral stent 08/14/16 by Alinda Money for residual left rueteral stone in setting of early pyelonephritis.  2 - Pyelonephritis - fevers to 102, malaise, bacteruria on presentation c/w likely early pyelo in setting of left renal obstruction . UCX 3/2 staph only. Placed on empiric Cipro (numerous allergies). By the morning of POD 2 she is afebrile x 24 hours.  By the morning of 3/4, the day of discharge, she is ambulatory, pain controlled on PO meds, afebrile x 24 hours, and felt to be adequate for discharge.    Consults: None  Significant Diagnostic Studies: labs: as per above  Treatments: surgery: LEFT ureteral stent 08/14/16  Discharge Exam: Blood pressure (!) 102/54, pulse 62, temperature 98.4 F (36.9 C), temperature source Oral, resp. rate 19, height 5\' 3"  (1.6 m), weight 74.3 kg (163 lb 12.8 oz), SpO2 97 %. General appearance: alert, cooperative and appears stated age Eyes: negative Nose: Nares normal. Septum midline. Mucosa normal. No drainage or sinus tenderness. Throat: lips, mucosa, and tongue normal; teeth and gums normal Neck: supple, symmetrical, trachea midline Back: symmetric, no curvature. ROM normal. No CVA tenderness. Resp: non-labored on room air.  Cardio: Nl rate GI: soft, non-tender; bowel sounds normal; no masses,  no organomegaly Pelvic: external genitalia normal and no exposed stent.  Extremities: extremities normal, atraumatic, no cyanosis or edema Pulses: 2+ and symmetric Lymph nodes: Cervical, supraclavicular, and axillary nodes normal. Neurologic: Grossly normal  Disposition: 01-Home or Self  Care   Allergies as of 08/16/2016      Reactions   Augmentin [amoxicillin-pot Clavulanate] Nausea Only, Other (See Comments)   Has patient had a PCN reaction causing immediate rash, facial/tongue/throat swelling, SOB or lightheadedness with hypotension: No Has patient had a PCN reaction causing severe rash involving mucus membranes or skin necrosis: No Has patient had a PCN reaction that required hospitalization No Has patient had a PCN reaction occurring within the last 10 years: No If all of the above answers are "NO", then may proceed with Cephalosporin use.   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     Penicillins Nausea Only, Other (See Comments)   Has patient had a PCN reaction causing immediate rash, facial/tongue/throat swelling, SOB or lightheadedness with hypotension: No Has patient had a PCN reaction causing severe rash involving mucus membranes or skin necrosis: No Has patient had a PCN reaction that required hospitalization No Has patient had a PCN reaction occurring within the last 10 years: No If all of the above answers are "NO", then may proceed with Cephalosporin use.   Phenergan [promethazine] Other (See Comments)   Reaction:  Hallucinations  Pt states that she is only allergic to IV form.    Sulfa Antibiotics Itching   Tamsulosin Itching   Lidocaine Palpitations      Medication List    TAKE these medications   acetaminophen 500 MG tablet Commonly known as:  TYLENOL Take 500-1,000 mg by mouth every 6 (six) hours as needed for mild pain, moderate pain or headache.   aspirin EC 81 MG tablet Take 81 mg by mouth daily.   brimonidine 0.1 %  Soln Commonly known as:  ALPHAGAN P Place 1 drop into both eyes 2 (two) times daily.   ciprofloxacin 500 MG tablet Commonly known as:  CIPRO Take 1 tablet (500 mg total) by mouth 2 (two) times daily.   clindamycin 300 MG capsule Commonly known as:   CLEOCIN Take 1 capsule (300 mg total) by mouth 3 (three) times daily. X 5 days for infection   DULoxetine 60 MG capsule Commonly known as:  CYMBALTA Take 60 mg by mouth daily.   ergocalciferol 50000 units capsule Commonly known as:  VITAMIN D2 Take 50,000 Units by mouth every Thursday.   ferrous sulfate 325 (65 FE) MG EC tablet Take 325 mg by mouth daily.   folic acid A999333 MCG tablet Commonly known as:  FOLVITE Take 400 mcg by mouth daily.   phenazopyridine 100 MG tablet Commonly known as:  PYRIDIUM Take 1 tablet (100 mg total) by mouth 3 (three) times daily as needed for pain (for burning).   potassium citrate 10 MEQ (1080 MG) SR tablet Commonly known as:  UROCIT-K Take 10 mEq by mouth 2 (two) times daily.   PROGRAF 0.5 MG capsule Generic drug:  tacrolimus Take 1.5 mg by mouth 2 (two) times daily.   propranolol ER 80 MG 24 hr capsule Commonly known as:  INDERAL LA Take 1 capsule (80 mg total) by mouth daily.   pyridOXINE 100 MG tablet Commonly known as:  VITAMIN B-6 Take 100 mg by mouth daily.   rizatriptan 5 MG disintegrating tablet Commonly known as:  MAXALT-MLT Take 1 tablet (5 mg total) by mouth as needed. May repeat in 2 hours if needed What changed:  reasons to take this  additional instructions   tamsulosin 0.4 MG Caps capsule Commonly known as:  FLOMAX Take 0.4 mg by mouth at bedtime.   traMADol 50 MG tablet Commonly known as:  ULTRAM Take 1 tablet (50 mg total) by mouth every 6 (six) hours as needed. What changed:  reasons to take this   traMADol 50 MG tablet Commonly known as:  ULTRAM Take 1 tablet (50 mg total) by mouth every 6 (six) hours as needed. Can take 1-2 tablets every 6 hours as needed. What changed:  You were already taking a medication with the same name, and this prescription was added. Make sure you understand how and when to take each.            Durable Medical Equipment        Start     Ordered   08/15/16 1550  For home  use only DME 3 n 1  Once     08/15/16 1549     Follow-up Information    BORDEN,LES, MD Follow up.   Specialty:  Urology Why:  09/04/16 at 8:15 AM Contact information: [place]  [phone]           Signed: Alexis Frock 08/16/2016, 6:56 AM

## 2016-08-16 NOTE — Progress Notes (Signed)
Last night during RN's med pass, RN went to give patient all of her medicines. Patient refused to take the prograf saying that she couldn't take the generic. Patient then stated she had her own prograf in her purse. RN then called night pharmacist who told RN that the brand prograf and the generic were interchangable, but that RN should bring patient's meds down to pharmacy. RN then went back to patient and told patient what the pharmacist said and patient still refused the med, so RN and another RN went in room to count the patient's prograf and got her to sign the home med sheet and then RN brought the meds to pharmacy for them to be packaged so that patient could take the exact medicine she brought from home. Pharmacist then walked up the packaged prograf and went into room with RN to educate the patient. Patient then took her home med and was told if she was only going to take the prograf from home she would need a friend to bring it to the hospital.

## 2016-08-17 ENCOUNTER — Encounter (HOSPITAL_COMMUNITY): Payer: Self-pay | Admitting: Urology

## 2016-08-19 ENCOUNTER — Other Ambulatory Visit: Payer: Self-pay | Admitting: Urology

## 2016-08-27 DIAGNOSIS — R8271 Bacteriuria: Secondary | ICD-10-CM | POA: Diagnosis not present

## 2016-08-28 ENCOUNTER — Encounter (HOSPITAL_COMMUNITY): Payer: Self-pay | Admitting: *Deleted

## 2016-08-30 MED ORDER — DEXTROSE 5 % IV SOLN
300.0000 mg | Freq: Once | INTRAVENOUS | Status: AC
  Administered 2016-08-31: 300 mg via INTRAVENOUS
  Filled 2016-08-30 (×2): qty 7.5

## 2016-08-31 ENCOUNTER — Encounter (HOSPITAL_COMMUNITY)
Admission: RE | Disposition: A | Discharge status: Home / Self Care | Payer: Self-pay | Source: Ambulatory Visit | Attending: Urology

## 2016-08-31 ENCOUNTER — Ambulatory Visit (HOSPITAL_COMMUNITY): Payer: Medicare Other

## 2016-08-31 ENCOUNTER — Encounter (HOSPITAL_COMMUNITY): Payer: Self-pay | Admitting: *Deleted

## 2016-08-31 ENCOUNTER — Ambulatory Visit (HOSPITAL_COMMUNITY): Payer: Medicare Other | Admitting: Certified Registered Nurse Anesthetist

## 2016-08-31 ENCOUNTER — Observation Stay (HOSPITAL_COMMUNITY)
Admission: RE | Admit: 2016-08-31 | Discharge: 2016-09-01 | Disposition: A | Discharge status: Home / Self Care | Payer: Medicare Other | Source: Ambulatory Visit | Attending: Urology | Admitting: Urology

## 2016-08-31 DIAGNOSIS — Z8673 Personal history of transient ischemic attack (TIA), and cerebral infarction without residual deficits: Secondary | ICD-10-CM | POA: Insufficient documentation

## 2016-08-31 DIAGNOSIS — Z7982 Long term (current) use of aspirin: Secondary | ICD-10-CM | POA: Insufficient documentation

## 2016-08-31 DIAGNOSIS — Z79899 Other long term (current) drug therapy: Secondary | ICD-10-CM | POA: Insufficient documentation

## 2016-08-31 DIAGNOSIS — N289 Disorder of kidney and ureter, unspecified: Secondary | ICD-10-CM | POA: Diagnosis not present

## 2016-08-31 DIAGNOSIS — Z944 Liver transplant status: Secondary | ICD-10-CM | POA: Insufficient documentation

## 2016-08-31 DIAGNOSIS — N202 Calculus of kidney with calculus of ureter: Principal | ICD-10-CM | POA: Insufficient documentation

## 2016-08-31 DIAGNOSIS — F329 Major depressive disorder, single episode, unspecified: Secondary | ICD-10-CM | POA: Diagnosis not present

## 2016-08-31 DIAGNOSIS — N201 Calculus of ureter: Secondary | ICD-10-CM | POA: Diagnosis present

## 2016-08-31 DIAGNOSIS — Z466 Encounter for fitting and adjustment of urinary device: Secondary | ICD-10-CM | POA: Diagnosis not present

## 2016-08-31 DIAGNOSIS — I10 Essential (primary) hypertension: Secondary | ICD-10-CM | POA: Insufficient documentation

## 2016-08-31 HISTORY — PX: CYSTOSCOPY/URETEROSCOPY/HOLMIUM LASER/STENT PLACEMENT: SHX6546

## 2016-08-31 LAB — BASIC METABOLIC PANEL
ANION GAP: 7 (ref 5–15)
BUN: 16 mg/dL (ref 6–20)
CHLORIDE: 102 mmol/L (ref 101–111)
CO2: 25 mmol/L (ref 22–32)
Calcium: 8.9 mg/dL (ref 8.9–10.3)
Creatinine, Ser: 0.98 mg/dL (ref 0.44–1.00)
GFR calc non Af Amer: 58 mL/min — ABNORMAL LOW (ref >60)
Glucose, Bld: 110 mg/dL — ABNORMAL HIGH (ref 65–99)
Potassium: 3.9 mmol/L (ref 3.5–5.1)
Sodium: 134 mmol/L — ABNORMAL LOW (ref 135–145)

## 2016-08-31 SURGERY — CYSTOSCOPY/URETEROSCOPY/HOLMIUM LASER/STENT PLACEMENT
Anesthesia: General | Laterality: Left

## 2016-08-31 MED ORDER — SODIUM CHLORIDE 0.9% FLUSH
3.0000 mL | Freq: Two times a day (BID) | INTRAVENOUS | Status: DC
  Administered 2016-08-31: 3 mL via INTRAVENOUS

## 2016-08-31 MED ORDER — SODIUM CHLORIDE 0.9 % IV SOLN: 250.0000 mL | INTRAVENOUS | Status: DC | PRN

## 2016-08-31 MED ORDER — LIDOCAINE HCL 1 % IJ SOLN
INTRAMUSCULAR | Status: DC | PRN
  Administered 2016-08-31: 60 mg via INTRADERMAL

## 2016-08-31 MED ORDER — MIDAZOLAM HCL 2 MG/2ML IJ SOLN
INTRAMUSCULAR | Status: AC
Start: 2016-08-31 — End: 2016-08-31
  Filled 2016-08-31: qty 2

## 2016-08-31 MED ORDER — DEXAMETHASONE SODIUM PHOSPHATE 10 MG/ML IJ SOLN
INTRAMUSCULAR | Status: AC
  Filled 2016-08-31: qty 1

## 2016-08-31 MED ORDER — MIDAZOLAM HCL 5 MG/5ML IJ SOLN
INTRAMUSCULAR | Status: DC | PRN
  Administered 2016-08-31: 2 mg via INTRAVENOUS

## 2016-08-31 MED ORDER — RIZATRIPTAN BENZOATE 5 MG PO TBDP: 5.0000 mg | ORAL_TABLET | ORAL | Status: DC | PRN

## 2016-08-31 MED ORDER — FOLIC ACID 0.5 MG HALF TAB
0.5000 mg | ORAL_TABLET | Freq: Every day | ORAL | Status: DC
  Filled 2016-08-31 (×2): qty 1

## 2016-08-31 MED ORDER — TACROLIMUS 1 MG PO CAPS
1.5000 mg | ORAL_CAPSULE | Freq: Two times a day (BID) | ORAL | Status: DC
  Filled 2016-08-31: qty 1

## 2016-08-31 MED ORDER — PROPOFOL 10 MG/ML IV BOLUS
INTRAVENOUS | Status: AC
  Filled 2016-08-31: qty 20

## 2016-08-31 MED ORDER — TACROLIMUS 0.5 MG PO CAPS
1.5000 mg | ORAL_CAPSULE | Freq: Two times a day (BID) | ORAL | Status: DC
  Administered 2016-09-01: 1.5 mg via ORAL

## 2016-08-31 MED ORDER — SUMATRIPTAN SUCCINATE 50 MG PO TABS
50.0000 mg | ORAL_TABLET | ORAL | Status: DC | PRN
  Filled 2016-08-31: qty 1

## 2016-08-31 MED ORDER — TRAMADOL HCL 50 MG PO TABS: 50.0000 mg | ORAL_TABLET | Freq: Four times a day (QID) | ORAL | Status: DC | PRN

## 2016-08-31 MED ORDER — DIPHENHYDRAMINE HCL 50 MG/ML IJ SOLN: 12.5000 mg | Freq: Four times a day (QID) | INTRAMUSCULAR | Status: DC | PRN

## 2016-08-31 MED ORDER — ACETAMINOPHEN 500 MG PO TABS
500.0000 mg | ORAL_TABLET | Freq: Four times a day (QID) | ORAL | Status: DC | PRN
  Administered 2016-09-01: 500 mg via ORAL
  Filled 2016-08-31: qty 1

## 2016-08-31 MED ORDER — FENTANYL CITRATE (PF) 100 MCG/2ML IJ SOLN: 25.0000 ug | INTRAMUSCULAR | Status: DC | PRN

## 2016-08-31 MED ORDER — DEXAMETHASONE SODIUM PHOSPHATE 10 MG/ML IJ SOLN
INTRAMUSCULAR | Status: DC | PRN
  Administered 2016-08-31: 10 mg via INTRAVENOUS

## 2016-08-31 MED ORDER — SODIUM CHLORIDE 0.9% FLUSH
3.0000 mL | INTRAVENOUS | Status: DC | PRN
Start: 2016-08-31 — End: 2016-09-01

## 2016-08-31 MED ORDER — DULOXETINE HCL 60 MG PO CPEP: 60.0000 mg | ORAL_CAPSULE | Freq: Every day | ORAL | Status: DC

## 2016-08-31 MED ORDER — ONDANSETRON HCL 4 MG/2ML IJ SOLN
INTRAMUSCULAR | Status: DC | PRN
  Administered 2016-08-31: 4 mg via INTRAVENOUS

## 2016-08-31 MED ORDER — POTASSIUM CITRATE ER 10 MEQ (1080 MG) PO TBCR
10.0000 meq | EXTENDED_RELEASE_TABLET | Freq: Two times a day (BID) | ORAL | Status: DC
Start: 2016-09-01 — End: 2016-09-01
  Administered 2016-09-01: 10 meq via ORAL
  Filled 2016-08-31 (×2): qty 1

## 2016-08-31 MED ORDER — ONDANSETRON HCL 4 MG/2ML IJ SOLN: 4.0000 mg | INTRAMUSCULAR | Status: DC | PRN

## 2016-08-31 MED ORDER — BRIMONIDINE TARTRATE 0.15 % OP SOLN
1.0000 [drp] | Freq: Two times a day (BID) | OPHTHALMIC | Status: DC
  Administered 2016-08-31: 1 [drp] via OPHTHALMIC
  Filled 2016-08-31: qty 5

## 2016-08-31 MED ORDER — ONDANSETRON HCL 4 MG/2ML IJ SOLN
INTRAMUSCULAR | Status: AC
  Filled 2016-08-31: qty 2

## 2016-08-31 MED ORDER — SODIUM CHLORIDE 0.9 % IR SOLN
Status: DC | PRN
  Administered 2016-08-31: 4000 mL via INTRAVESICAL

## 2016-08-31 MED ORDER — LIDOCAINE 2% (20 MG/ML) 5 ML SYRINGE
INTRAMUSCULAR | Status: AC
  Filled 2016-08-31: qty 5

## 2016-08-31 MED ORDER — DIPHENHYDRAMINE HCL 12.5 MG/5ML PO ELIX: 12.5000 mg | ORAL_SOLUTION | Freq: Four times a day (QID) | ORAL | Status: DC | PRN

## 2016-08-31 MED ORDER — ASPIRIN EC 81 MG PO TBEC: 81.0000 mg | DELAYED_RELEASE_TABLET | Freq: Every day | ORAL | Status: DC

## 2016-08-31 MED ORDER — LACTATED RINGERS IV SOLN
INTRAVENOUS | Status: DC
  Administered 2016-08-31: 16:00:00 via INTRAVENOUS

## 2016-08-31 MED ORDER — ZOLPIDEM TARTRATE 5 MG PO TABS: 5.0000 mg | ORAL_TABLET | Freq: Every evening | ORAL | Status: DC | PRN

## 2016-08-31 MED ORDER — VITAMIN B-6 100 MG PO TABS
100.0000 mg | ORAL_TABLET | Freq: Every day | ORAL | Status: DC
  Administered 2016-08-31: 100 mg via ORAL
  Filled 2016-08-31 (×2): qty 1

## 2016-08-31 MED ORDER — FENTANYL CITRATE (PF) 100 MCG/2ML IJ SOLN
INTRAMUSCULAR | Status: AC
  Filled 2016-08-31: qty 2

## 2016-08-31 MED ORDER — KETOROLAC TROMETHAMINE 15 MG/ML IJ SOLN
15.0000 mg | Freq: Four times a day (QID) | INTRAMUSCULAR | Status: DC
  Administered 2016-08-31 – 2016-09-01 (×2): 15 mg via INTRAVENOUS
  Filled 2016-08-31 (×2): qty 1

## 2016-08-31 MED ORDER — FENTANYL CITRATE (PF) 100 MCG/2ML IJ SOLN
INTRAMUSCULAR | Status: DC | PRN
  Administered 2016-08-31 (×2): 25 ug via INTRAVENOUS

## 2016-08-31 MED ORDER — PROPRANOLOL HCL ER 80 MG PO CP24
80.0000 mg | ORAL_CAPSULE | Freq: Every day | ORAL | Status: DC
  Filled 2016-08-31: qty 1

## 2016-08-31 MED ORDER — PHENAZOPYRIDINE HCL 100 MG PO TABS
100.0000 mg | ORAL_TABLET | Freq: Three times a day (TID) | ORAL | Status: DC | PRN
  Administered 2016-09-01: 100 mg via ORAL
  Filled 2016-08-31 (×2): qty 1

## 2016-08-31 MED ORDER — PROPOFOL 10 MG/ML IV BOLUS
INTRAVENOUS | Status: DC | PRN
  Administered 2016-08-31: 200 mg via INTRAVENOUS

## 2016-08-31 SURGICAL SUPPLY — 17 items
BAG URO CATCHER STRL LF (MISCELLANEOUS) ×2 IMPLANT
BASKET ZERO TIP NITINOL 2.4FR (BASKET) ×4 IMPLANT
CATH INTERMIT  6FR 70CM (CATHETERS) ×2 IMPLANT
CLOTH BEACON ORANGE TIMEOUT ST (SAFETY) ×2 IMPLANT
FIBER LASER FLEXIVA 365 (UROLOGICAL SUPPLIES) ×2 IMPLANT
FIBER LASER TRAC TIP (UROLOGICAL SUPPLIES) IMPLANT
GLOVE BIOGEL M STRL SZ7.5 (GLOVE) ×6 IMPLANT
GOWN STRL REUS W/TWL LRG LVL3 (GOWN DISPOSABLE) ×4 IMPLANT
GUIDEWIRE ANG ZIPWIRE 038X150 (WIRE) IMPLANT
GUIDEWIRE STR DUAL SENSOR (WIRE) ×2 IMPLANT
IV NS 1000ML (IV SOLUTION) ×1
IV NS 1000ML BAXH (IV SOLUTION) ×1 IMPLANT
MANIFOLD NEPTUNE II (INSTRUMENTS) ×2 IMPLANT
PACK CYSTO (CUSTOM PROCEDURE TRAY) ×2 IMPLANT
SHEATH ACCESS URETERAL 38CM (SHEATH) IMPLANT
STENT URET 6FRX24 CONTOUR (STENTS) ×2 IMPLANT
TUBING CONNECTING 10 (TUBING) ×2 IMPLANT

## 2016-08-31 NOTE — Progress Notes (Signed)
Alesia Banda, Asst directorof PST made aware of patient's concerns and issues last admission to hospital  and patient will be coming in on 08/31/2016 at 1430 for more surgery.  Colon Branch, Asst director was aware patient was on [facility] on last admission.

## 2016-08-31 NOTE — H&P (View-Only) (Signed)
@media  Print    Office Visit Report 08/14/2016    Candace Hill         MRN: [id number]  PRIMARY CARE:  [name], MD  DOB: 07/25/1945, 71 year old Female  REFERRING:  Raynelle Bring, MD  SSN: -[id number]  PROVIDER:  Raynelle Bring, M.D.    LOCATION:  [facility] [phone]    CC/HPI: Left renal calculi   [name] returns today for follow-up of her large burden left renal calculi. She underwent a two-stage ureteroscopic laser lithotripsy procedure after reviewing options for treatment of her calculi. She did wish to avoid a percutaneous nephrolithotomy considering her immunosuppression and history of liver transplant. Her second procedure was performed approximately 1 week ago. She removed her stent on Tuesday. She did develop severe left-sided abdominal pain with associated nausea on Tuesday evening. Her pain has gradually improved but she has continued having what she describes as severe bladder spasms and intermittent mild to severe nausea. She has had some hematuria. She denies any subjective fever. She follows up today with a KUB and renal ultrasound.     ALLERGIES: Augmentin TABS - Nausea Codeine Derivatives - Nausea Lidocaine HCl (PF) SOLN - Other Reaction, palpations novacaine - Other Reaction, palpations Penicillins - Other Reaction, unknown Phenergan TABS - Other Reaction, hallucination Sulfa Drugs - Nausea   MEDICATIONS: Potassium Citrate Er 10 meq (1,080 mg) tablet, extended release TAKE 1 TABLET BY MOUTH EVERY MORNING AND 2 TABLETS BY MOUTH EVERY EVENING  Tamsulosin Hcl 0.4 mg capsule, ext release 24 hr 1 capsule PO Q HS  Tamsulosin Hcl 0.4 mg capsule, ext release 24 hr 1 capsule PO Q HS  Tramadol Hcl 50 mg tablet 1 tablet PO Q 4-6 H PRN  Aspirin Ec 81 mg tablet, delayed release  Cymbalta 60 mg capsule,delayed release Oral  Prograf 0.5 mg capsule 0 Oral  Propranolol Hcl 80 mg tablet Oral  Rizatriptan 5 mg tablet Oral    GU PSH: Cystoscopy -  06/17/2016 ESWL - 2014 Ureteroscopic laser litho, Left - 08/06/2016, Left - 07/06/2016   NON-GU PSH: Cholecystectomy (open) Hernia Repair - 2011 Sinus Surgery.. Transplant Liver        GU PMH: Gross hematuria, Culture urine. No ABX unless culture proven UTI. - 05/13/2016 Kidney Stone, Nephrolithiasis - 10/29/2015 Urinary Tract Inf, Unspec site, Urinary tract infection - 04/04/2015 Abnormal urine findings, Unspec, Abnormal urine findings - 2014 Nocturia, Nocturia - 2014 Renal Cysts, Simple, Renal cysts, acquired, bilateral - 2014      PMH Notes:   1) Urolithiasis: She has a history of uric acid and calcium oxalate urolithiasis and has required lithotripsy in the past.   Current treatment: Potassium citrate 20 mEq po bid   Jan 2018: L ureteroscopic laser lithotripsy for large burden left renal calculi  Feb 2018: 2nd stage left ureteroscopic laser lithotripsy   2) Atypical cytology: She was incidentally noted to have some thickening of the left renal pelvis on a CT scan in June 2014 at which time she had a symptomatic left UPJ calculus. This was very consistent with inflammation considering it was in the location of her stone. However, the radiologist read this as possibly worrisome for a urothelial tumor among the differential diagnoses. She had a followup CT scan in July which demonstrated resolution of the renal pelvic abnormality. A urine cytology was performed at the same time and demonstrated atypical cells. Another followup urine cytology in September was also atypical but a reflex FISH  analysis was negative. It was felt that her initial findings were likely inflammatory considering the resolution of these findings on follow up imaging.   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 She is currently undergoing therapy for treatment of hepatitis C which she contracted during a blood transfusion.     NON-GU PMH: Bacteriuria -  06/17/2016 Cerebral infarction, unspecified, Mini stroke - 04/04/2015 Asthma, Asthma - 2014 Hepatitis C, Chronic Hepatitis, C Virus - 2014 Personal history of other diseases of the nervous system and sense organs, History of glaucoma - 2014 Personal history of other mental and behavioral disorders, History of depression - 2014 Unspecified cirrhosis of liver, Cirrhosis - 2014 Liver Cancer, History Stroke/TIA    FAMILY HISTORY: No Significant Family History - Runs In Family Strokes - Runs In Family   SOCIAL HISTORY: Marital Status: Divorced Current Smoking Status: Patient has never smoked.  Does not drink anymore.  Does not drink caffeine.    REVIEW OF SYSTEMS:     GU Review Female:  Patient reports frequent urination, hard to postpone urination, get up at night to urinate, leakage of urine, stream starts and stops, trouble starting your stream, and have to strain to urinate. Patient denies burning /pain with urination and currently pregnant.    Gastrointestinal (Upper):  Patient reports nausea. Patient denies vomiting.    Gastrointestinal (Lower):  Patient denies diarrhea and constipation.    Constitutional:  Patient denies fever, night sweats, weight loss, and fatigue.    Skin:  Patient denies skin rash/ lesion and itching.    Eyes:  Patient denies blurred vision and double vision.    Ears/ Nose/ Throat:  Patient denies sore throat and sinus problems.    Hematologic/Lymphatic:  Patient denies swollen glands and easy bruising.    Cardiovascular:  Patient denies leg swelling and chest pains.    Respiratory:  Patient denies cough and shortness of breath.    Endocrine:  Patient denies excessive thirst.    Musculoskeletal:  Patient denies back pain and joint pain.    Neurological:  Patient denies headaches and dizziness.    Psychologic:  Patient denies depression and anxiety.    VITAL SIGNS:       08/14/2016 08:38 AM     Weight 154 lb / 69.85 kg     Height 63 in / 160.02 cm     BP 126/72  mmHg     Pulse 90 /min     Temperature 99.9 F / 38 C     BMI 27.3 kg/m     MULTI-SYSTEM PHYSICAL EXAMINATION:      Constitutional: Well-nourished. No physical deformities. Normally developed. Good grooming.     Neck: Neck symmetrical, not swollen. Normal tracheal position.     Respiratory: No labored breathing, no use of accessory muscles. Clear bilaterally.     Cardiovascular: Normal temperature, normal extremity pulses, no swelling, no varicosities. Regular rate and rhythm.     Lymphatic: No enlargement of neck, axillae, groin.     Skin: No paleness, no jaundice, no cyanosis. No lesion, no ulcer, no rash.     Neurologic / Psychiatric: Oriented to time, oriented to place, oriented to person. No depression, no anxiety, no agitation.     Gastrointestinal: No CVA tenderness.     Eyes: Normal conjunctivae. Normal eyelids.     Ears, Nose, Mouth, and Throat: Left ear no scars, no lesions, no masses. Right ear no scars, no lesions, no masses. Nose  no scars, no lesions, no masses. Normal hearing. Normal lips.     Musculoskeletal: Normal gait and station of head and neck.            PAST DATA REVIEWED:   Source Of History:  Patient  Urine Test Review:  Urinalysis  X-Ray Review: KUB: Reviewed Films. I independently reviewed her KUB x-ray. This demonstrates a significant reduction of the left renal stone burden. However, she does appear to have multiple stone fragments within the distal left ureter. Renal Ultrasound (Limited): Reviewed Films. I reviewed her left renal ultrasound. This demonstrates moderate left hydronephrosis with a dilated renal pelvis and proximal ureter. She does have multiple small echogenic foci consistent with nonobstructing renal calculi.    Notes:  Urine will be cultured.   PROCEDURES:    Renal Ultrasound (Limited) - 40981  Left Kidney: Length: 11.89 cm Depth: 4.61 cm Cortical Width: 0.70 cm Width: 4.94 cm    Left Kidney/Ureter:  1)Moderate Hydro with Dilated  Renal Pelvis measuring 3.26cm and Dilated Proximal Ureter measuring 046cm-----2)Lower Pole Stone (1 vs 2) measuring1.11cm-----3)Upper Pole Hyperechoic Areas measuring 0.43cm and 0.54cm - ? Stones-----4)0.86cm Mid Pole Hyperechoic Area - ? stone  Bladder:  PVR = bladder not visualized           KUB - 74018  A single view of the abdomen is obtained.          Urinalysis w/Scope - 81001  Dipstick Dipstick Cont'd Micro  Color: Yellow Bilirubin: Neg WBC/hpf: >60/hpf  Appearance: Turbid Ketones: Neg RBC/hpf: 3 - 10/hpf  Specific Gravity: 1.020 Blood: 3+ Bacteria: Mod (26-50/hpf)  pH: 6.5 Protein: 2+ Cystals: NS (Not Seen)  Glucose: Neg Urobilinogen: 0.2 Casts: NS (Not Seen)   Nitrites: Positive Trichomonas: Not Present   Leukocyte Esterase: 3+ Mucous: Not Present    Epithelial Cells: 0 - 5/hpf    Yeast: NS (Not Seen)    Sperm: Not Present   Notes:  microscopic performed on unconcentrated urine      ASSESSMENT:     ICD-10 Details  1 GU:  Kidney Stone - N20.0   2  Calculus Ureter - N20.1    PLAN:   Orders  Labs Urine Culture and Sensitivity  Schedule  Return Visit/Planned Activity: Other See Visit Notes  Note: She is scheduled for surgery later today.  Document  Letter(s):  Created for Patient: Clinical Summary   Notes:  1. Left renal and ureteral calculi: Considering her large burden of left ureteral calculi, as well as her history of immunosuppression and abnormal urinalysis, I have recommended that she proceed with cystoscopy and left ureteral stent placement today. If she does not have any obvious signs of infection, we also may plan to attempt to remove her left ureteral calculi although I will have a low threshold for simply placing a ureteral stent if she has any signs of obvious infection especially considering her immunosuppression. We have reviewed this procedure in detail including the potential risks, complications, and expected recovery process. She fully  understands that she may require a second procedure for removal of her stones if there is concern for active infection. She has been given a tablet of Cipro 500 mg to begin taking. Her urine has been cultured. Her surgery is scheduled for 5 PM today. She will notify should she develop fever prior to her planned surgery time.   2. Recurrent urolithiasis: Following resolution of her acute stone episode, we will proceed with a full metabolic evaluation in order to discuss  preventative recommendations going forward. Her stone type has been calcium oxalate monohydrate.   Cc: Dr. Darcus Austin

## 2016-08-31 NOTE — Transfer of Care (Signed)
Immediate Anesthesia Transfer of Care Note  Patient: Candace Hill  Procedure(s) Performed: Procedure(s): CYSTOSCOPY/URETEROSCOPY/HOLMIUM LASER/STENT PLACEMENT/BASKET STONE REMOVAL (Left)  Patient Location: PACU  Anesthesia Type:General  Level of Consciousness: awake, alert  and oriented  Airway & Oxygen Therapy: Patient Spontanous Breathing and Patient connected to face mask oxygen  Post-op Assessment: Report given to RN and Post -op Vital signs reviewed and stable  Post vital signs: Reviewed and stable  Last Vitals:  Vitals:   08/31/16 1534  BP: (!) 142/79  Pulse: 75  Resp: 18  Temp: 36.4 C    Last Pain:  Vitals:   08/31/16 1534  TempSrc: Oral         Complications: No apparent anesthesia complications

## 2016-08-31 NOTE — Progress Notes (Addendum)
Spent a lot of time with patient over preop phone call allowing patient to ventilate regarding most recent preop experience.  Placed a note on chart regarding patient requests for this next surgery and hospitalization.  Will Games developer, Terri sharpe who I will inform of above so she perhaps may want to speak with patient upon arrival.   Spent a lot of time reviewing medications and told patient to continue prescription medications just as normally would day of surgery and to take at usual times day of surgery with a sip of water.  Nurse had this conversation with patient on 08/28/16 at 1630-1645pm in the afternoon.

## 2016-08-31 NOTE — Anesthesia Preprocedure Evaluation (Signed)
Anesthesia Evaluation  Patient identified by MRN, date of birth, ID band Patient awake    Reviewed: Allergy & Precautions, NPO status , Patient's Chart, lab work & pertinent test results  History of Anesthesia Complications Negative for: history of anesthetic complications  Airway Mallampati: II  TM Distance: >3 FB Neck ROM: Full    Dental no notable dental hx. (+) Dental Advisory Given   Pulmonary neg pulmonary ROS,    Pulmonary exam normal        Cardiovascular hypertension, Normal cardiovascular exam  01/2015 Impressions:  - Normal LV systolic function; grade 1 diastolic dysfunction;   trace TR.   Neuro/Psych PSYCHIATRIC DISORDERS Depression TIAnegative neurological ROS     GI/Hepatic GERD  ,(+) Hepatitis -, CS/p liver transplant.   Endo/Other  negative endocrine ROS  Renal/GU Renal disease     Musculoskeletal   Abdominal   Peds  Hematology   Anesthesia Other Findings   Reproductive/Obstetrics                             Lab Results  Component Value Date   WBC 5.7 08/15/2016   HGB 11.6 (L) 08/15/2016   HCT 32.9 (L) 08/15/2016   MCV 86.8 08/15/2016   PLT 163 08/15/2016   Lab Results  Component Value Date   CREATININE 1.31 (H) 08/15/2016   BUN 13 08/15/2016   NA 131 (L) 08/15/2016   K 4.3 08/15/2016   CL 99 (L) 08/15/2016   CO2 24 08/15/2016    Anesthesia Physical  Anesthesia Plan  ASA: III  Anesthesia Plan: General   Post-op Pain Management:    Induction: Intravenous  Airway Management Planned: LMA and Oral ETT  Additional Equipment:   Intra-op Plan:   Post-operative Plan: Extubation in OR  Informed Consent: I have reviewed the patients History and Physical, chart, labs and discussed the procedure including the risks, benefits and alternatives for the proposed anesthesia with the patient or authorized representative who has indicated his/her understanding  and acceptance.   Dental advisory given  Plan Discussed with: Anesthesiologist and CRNA  Anesthesia Plan Comments:         Anesthesia Quick Evaluation

## 2016-08-31 NOTE — Discharge Instructions (Signed)

## 2016-08-31 NOTE — Anesthesia Procedure Notes (Signed)
Procedure Name: LMA Insertion Date/Time: 08/31/2016 5:36 PM Performed by: Gaston Islam EVETTE Pre-anesthesia Checklist: Patient identified, Emergency Drugs available, Suction available and Patient being monitored Patient Re-evaluated:Patient Re-evaluated prior to inductionOxygen Delivery Method: Circle system utilized Preoxygenation: Pre-oxygenation with 100% oxygen Intubation Type: IV induction Ventilation: Mask ventilation without difficulty LMA: LMA inserted LMA Size: 4.0 Number of attempts: 1 Placement Confirmation: positive ETCO2,  CO2 detector and breath sounds checked- equal and bilateral Tube secured with: Tape Dental Injury: Teeth and Oropharynx as per pre-operative assessment

## 2016-08-31 NOTE — Op Note (Signed)
Preoperative diagnosis: Left ureteral calculi  Postoperative diagnosis: Left ureteral calculi  Procedure:  1. Cystoscopy 2. Left ureteroscopy and stone removal 3. Ureteroscopic laser lithotripsy 4. Left ureteral stent placement (6 x 24 - no string)  Surgeon: Roxy Horseman, Brooke Bonito. M.D.  Anesthesia: General  Complications: None  EBL: Minimal   Indication: Candace Hill is a 71 y.o. year old patient with urolithiasis and known residual left ureteral calculi.  She underwent staged ureteroscopic laser lithotripsy and presents today to hopefully render her free of remaining ureteral stones. After reviewing the management options for treatment, the patient elected to proceed with the above surgical procedure(s). We have discussed the potential benefits and risks of the procedure, side effects of the proposed treatment, the likelihood of the patient achieving the goals of the procedure, and any potential problems that might occur during the procedure or recuperation. Informed consent has been obtained.  Description of procedure:  The patient was taken to the operating room and general anesthesia was induced.  The patient was placed in the dorsal lithotomy position, prepped and draped in the usual sterile fashion, and preoperative antibiotics were administered. A preoperative time-out was performed.   Cystourethroscopy was performed.  The patient's urethra was examined and was normal. The bladder was then systematically examined in its entirety. There was no evidence for any bladder tumors, stones, or other mucosal pathology.    Attention then turned to the left ureteral orifice and the indwelling left ureteral stent was grasped and brought to the urethral meatus.  A 0.38 sensor guidewire was then advanced up the left ureter into the renal pelvis under fluoroscopic guidance. The 6 Fr semirigid ureteroscope was then advanced into the ureter next to the guidewire and the calculus was  identified.   The stone was then fragmented with the 365 micron holmium laser fiber on a setting of 0.6 J and frequency of 6 Hz.   All stones were then removed from the ureter with a zero tip nitinol basket.  Reinspection of the ureter revealed no remaining visible stones or fragments.   The wire was then backloaded through the cystoscope and a ureteral stent was advance over the wire using Seldinger technique.  The stent was positioned appropriately under fluoroscopic and cystoscopic guidance.  The wire was then removed with an adequate stent curl noted in the renal pelvis as well as in the bladder.  The bladder was then emptied and the procedure ended.  The patient appeared to tolerate the procedure well and without complications.  The patient was able to be awakened and transferred to the recovery unit in satisfactory condition.

## 2016-08-31 NOTE — Progress Notes (Signed)
Spoke with Alesia Banda, Surveyor, quantity and she will come to me to get information on patient and concerns.

## 2016-08-31 NOTE — Progress Notes (Signed)
LVMM on line of patient care experience with MRN and patient had concerns over last hospitalization .  Nurse call back number was given along with patient is returning her for surgery on 08/31/16 at 1430pm.

## 2016-08-31 NOTE — Anesthesia Postprocedure Evaluation (Signed)
Anesthesia Post Note  Patient: Candace Hill  Procedure(s) Performed: Procedure(s) (LRB): CYSTOSCOPY/URETEROSCOPY/HOLMIUM LASER/STENT PLACEMENT/BASKET STONE REMOVAL (Left)  Patient location during evaluation: PACU Anesthesia Type: General Level of consciousness: awake and alert Pain management: pain level controlled Vital Signs Assessment: post-procedure vital signs reviewed and stable Respiratory status: spontaneous breathing, nonlabored ventilation, respiratory function stable and patient connected to nasal cannula oxygen Cardiovascular status: blood pressure returned to baseline and stable Postop Assessment: no signs of nausea or vomiting Anesthetic complications: no       Last Vitals:  Vitals:   08/31/16 1845 08/31/16 1900  BP: 130/77 105/75  Pulse: 68 66  Resp: 17 14  Temp:      Last Pain:  Vitals:   08/31/16 1900  TempSrc:   PainSc: 2                  Tiajuana Amass

## 2016-08-31 NOTE — Interval H&P Note (Signed)
History and Physical Interval Note:  08/31/2016 5:21 PM  [name] now returns for treatment of her ureteral calculi s/p stent placement and resolution of her recent infection.  Her recent urine culture from last week was negative.  Candace Hill  has presented today for surgery, with the diagnosis of LEFT URETERAL CALCULI  The various methods of treatment have been discussed with the patient and family. After consideration of risks, benefits and other options for treatment, the patient has consented to  Procedure(s): CYSTOSCOPY/URETEROSCOPY/HOLMIUM LASER/STENT PLACEMENT (Left) as a surgical intervention .  The patient's history has been reviewed, patient examined, no change in status, stable for surgery.  I have reviewed the patient's chart and labs.  Questions were answered to the patient's satisfaction.     Kyland No,LES

## 2016-09-01 DIAGNOSIS — Z8673 Personal history of transient ischemic attack (TIA), and cerebral infarction without residual deficits: Secondary | ICD-10-CM | POA: Diagnosis not present

## 2016-09-01 DIAGNOSIS — Z79899 Other long term (current) drug therapy: Secondary | ICD-10-CM | POA: Diagnosis not present

## 2016-09-01 DIAGNOSIS — I10 Essential (primary) hypertension: Secondary | ICD-10-CM | POA: Diagnosis not present

## 2016-09-01 DIAGNOSIS — Z944 Liver transplant status: Secondary | ICD-10-CM | POA: Diagnosis not present

## 2016-09-01 DIAGNOSIS — Z7982 Long term (current) use of aspirin: Secondary | ICD-10-CM | POA: Diagnosis not present

## 2016-09-01 DIAGNOSIS — N202 Calculus of kidney with calculus of ureter: Secondary | ICD-10-CM | POA: Diagnosis not present

## 2016-09-01 MED ORDER — CIPROFLOXACIN HCL 250 MG PO TABS: 250.0000 mg | ORAL_TABLET | Freq: Two times a day (BID) | ORAL | 0 refills | Status: DC

## 2016-09-01 MED ORDER — TACROLIMUS 0.5 MG PO CAPS
1.5000 mg | ORAL_CAPSULE | Freq: Two times a day (BID) | ORAL | Status: DC
  Filled 2016-09-01: qty 3

## 2016-09-01 NOTE — Progress Notes (Signed)
Assessment unchanged. Pt verbalized understanding of dc instructions through teach back. Script x 1 given as provided by MD. No complaints voiced. Pt understands follow up care and when to call the doctor. Discharge via foot per request accompanied by NT.

## 2016-09-01 NOTE — Discharge Summary (Signed)
Date of admission: 08/31/2016  Date of discharge: 09/01/2016  Admission diagnosis: Left ureteral calculi  Discharge diagnosis: Left ureteral calculi  Secondary diagnoses: Liver failure s/p transplant,   History and Physical: For full details, please see admission history and physical. Briefly, Candace Hill is a 71 y.o. year old patient with urolithiasis.  She has undergone stage ureteroscopic laser lithotripsy for large burden left renal calculi.  She recently presented with ureteral stones and infection and is s/p stent placement and appropriate antibiotic therapy.  She presented for definitive treatment of her remaining ureteral stones.   Hospital Course: She underwent left ureteroscopic laser lithotripsy and stone removal.  All ureteral stones were removed.  She tolerated the procedure well.  Due to the fact she lives alone and is immunosuppressed with recent infection, she was observed overnight.  She remained stable without problems and was able to be discharged on POD # 1.   Recent Labs  08/31/16 1557  CREATININE 0.98    Disposition: Home  Discharge instruction:  No activity restrictions.  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     Penicillins Nausea Only, Other (See Comments)   Has patient had a PCN reaction causing immediate rash, facial/tongue/throat swelling, SOB or lightheadedness with hypotension: No Has patient had a PCN reaction causing severe rash involving mucus membranes or skin necrosis: No Has patient had a PCN reaction that required hospitalization No Has patient had a PCN reaction occurring within the last 10 years: No If all of the above answers are "NO", then may proceed with Cephalosporin use.   Phenergan [promethazine] Other (See Comments)   Reaction:  Hallucinations  Pt states that she is only allergic to IV form.     Sulfa Antibiotics Itching   Tamsulosin Itching   Lidocaine Palpitations      Medication List    TAKE these medications   acetaminophen 500 MG tablet Commonly known as:  TYLENOL Take 500-1,000 mg by mouth every 6 (six) hours as needed for mild pain, moderate pain or headache.   aspirin EC 81 MG tablet Take 81 mg by mouth daily. Takes 2 pm in the afternoon   brimonidine 0.1 % Soln Commonly known as:  ALPHAGAN P Place 1 drop into both eyes 2 (two) times daily.   ciprofloxacin 500 MG tablet Commonly known as:  CIPRO Take 1 tablet (500 mg total) by mouth 2 (two) times daily. What changed:  Another medication with the same name was added. Make sure you understand how and when to take each.   ciprofloxacin 250 MG tablet Commonly known as:  CIPRO Take 1 tablet (250 mg total) by mouth 2 (two) times daily. What changed:  You were already taking a medication with the same name, and this prescription was added. Make sure you understand how and when to take each.   clindamycin 300 MG capsule Commonly known as:  CLEOCIN Take 1 capsule (300 mg total) by mouth 3 (three) times daily. X 5 days for infection   DULoxetine 60 MG capsule Commonly known as:  CYMBALTA Take 60 mg by mouth daily. Takes at 2ooam   ergocalciferol 50000 units capsule Commonly known as:  VITAMIN D2 Take 50,000 Units by mouth every Thursday.   ferrous sulfate 325 (65 FE) MG EC tablet Take 325 mg by mouth daily.   folic acid 510  MCG tablet Commonly known as:  FOLVITE Take 400 mcg by mouth daily.   phenazopyridine 100 MG tablet Commonly known as:  PYRIDIUM Take 1 tablet (100 mg total) by mouth 3 (three) times daily as needed for pain (for burning).   potassium citrate 10 MEQ (1080 MG) SR tablet Commonly known as:  UROCIT-K Take 10 mEq by mouth 2 (two) times daily. Takes at 2pm and 200am   PROGRAF 0.5 MG capsule Generic drug:  tacrolimus Take 1.5 mg by mouth 2 (two) times daily. Takes at 2pm and 2am    propranolol ER 80 MG 24 hr capsule Commonly known as:  INDERAL LA Take 1 capsule (80 mg total) by mouth daily. What changed:  additional instructions   pyridOXINE 100 MG tablet Commonly known as:  VITAMIN B-6 Take 100 mg by mouth daily.   rizatriptan 5 MG disintegrating tablet Commonly known as:  MAXALT-MLT Take 1 tablet (5 mg total) by mouth as needed. May repeat in 2 hours if needed What changed:  reasons to take this  additional instructions   tamsulosin 0.4 MG Caps capsule Commonly known as:  FLOMAX Take 0.4 mg by mouth at bedtime. Patient does not take at this time   traMADol 50 MG tablet Commonly known as:  ULTRAM Take 1 tablet (50 mg total) by mouth every 6 (six) hours as needed.   traMADol 50 MG tablet Commonly known as:  ULTRAM Take 1 tablet (50 mg total) by mouth every 6 (six) hours as needed. Can take 1-2 tablets every 6 hours as needed.       Followup:  Follow-up Information    Artemus Romanoff,LES, MD.   Specialty:  Urology Why:  09/04/16 Contact information: [address] [place] [address] [phone]

## 2016-09-01 NOTE — Progress Notes (Signed)
Patient ID: Candace Hill, female   DOB: 18-Nov-1945, 71 y.o.   MRN: [id number]   1 Day Post-Op Subjective: Pt doing well.  No complaints.  Expected hematuria overnight.  Objective: Vital signs in last 24 hours: Temp:  [97.5 F (36.4 C)-98.1 F (36.7 C)] 97.8 F (36.6 C) (03/20 0520) Pulse Rate:  [64-75] 66 (03/20 0520) Resp:  [7-18] 16 (03/20 0520) BP: (105-146)/(59-86) 118/65 (03/20 0520) SpO2:  [98 %-100 %] 98 % (03/20 0520) Weight:  [70.4 kg (155 lb 3.2 oz)] 70.4 kg (155 lb 3.2 oz) (03/19 1534)  Intake/Output from previous day: 03/19 0701 - 03/20 0700 In: 950 [I.V.:950] Out: 1625 [Urine:1625] Intake/Output this shift: Total I/O In: 250 [I.V.:250] Out: 1525 [Urine:1525]  Physical Exam:  General: Alert and oriented Abdomen: Minimal CVAT  Lab Results: BMET  Recent Labs  08/31/16 1557  NA 134*  K 3.9  CL 102  CO2 25  GLUCOSE 110*  BUN 16  CREATININE 0.98  CALCIUM 8.9     Assessment/Plan: Discharge home this morning.   LOS: 0 days   Kristine Chahal,LES 09/01/2016, 6:48 AM

## 2016-09-02 ENCOUNTER — Encounter (HOSPITAL_COMMUNITY): Payer: Self-pay | Admitting: Urology

## 2016-09-04 DIAGNOSIS — N202 Calculus of kidney with calculus of ureter: Secondary | ICD-10-CM | POA: Diagnosis not present

## 2016-09-04 DIAGNOSIS — N201 Calculus of ureter: Secondary | ICD-10-CM | POA: Diagnosis not present

## 2016-09-29 DIAGNOSIS — N202 Calculus of kidney with calculus of ureter: Secondary | ICD-10-CM | POA: Diagnosis not present

## 2016-10-08 DIAGNOSIS — F411 Generalized anxiety disorder: Secondary | ICD-10-CM | POA: Diagnosis not present

## 2016-10-08 DIAGNOSIS — F331 Major depressive disorder, recurrent, moderate: Secondary | ICD-10-CM | POA: Diagnosis not present

## 2016-10-20 DIAGNOSIS — Z1231 Encounter for screening mammogram for malignant neoplasm of breast: Secondary | ICD-10-CM | POA: Diagnosis not present

## 2016-11-03 DIAGNOSIS — N200 Calculus of kidney: Secondary | ICD-10-CM | POA: Diagnosis not present

## 2016-11-16 NOTE — Anesthesia Postprocedure Evaluation (Signed)
Anesthesia Post Note  Patient: Candace Hill  Procedure(s) Performed: Procedure(s) (LRB): CYSTOSCOPY/URETEROSCOPY/HOLMIUM LASER/STENT PLACEMENT (Left)     Anesthesia Post Evaluation  Last Vitals:  Vitals:   08/06/16 1826 08/06/16 1916  BP: 121/61 115/74  Pulse: 66 70  Resp: 16 18  Temp: 36.9 C 37.1 C    Last Pain:  Vitals:   08/06/16 1916  TempSrc: Oral  PainSc: 1                  Nargis Abrams S

## 2016-11-16 NOTE — Addendum Note (Signed)
Addendum  created 11/16/16 1127 by Myrtie Soman, MD   Sign clinical note

## 2016-11-24 DIAGNOSIS — E612 Magnesium deficiency: Secondary | ICD-10-CM | POA: Diagnosis not present

## 2016-11-24 DIAGNOSIS — Z5181 Encounter for therapeutic drug level monitoring: Secondary | ICD-10-CM | POA: Diagnosis not present

## 2016-11-24 DIAGNOSIS — Z944 Liver transplant status: Secondary | ICD-10-CM | POA: Diagnosis not present

## 2016-11-26 DIAGNOSIS — H35352 Cystoid macular degeneration, left eye: Secondary | ICD-10-CM | POA: Diagnosis not present

## 2016-11-26 DIAGNOSIS — H401131 Primary open-angle glaucoma, bilateral, mild stage: Secondary | ICD-10-CM | POA: Diagnosis not present

## 2016-12-03 DIAGNOSIS — H26491 Other secondary cataract, right eye: Secondary | ICD-10-CM | POA: Diagnosis not present

## 2016-12-03 DIAGNOSIS — Z961 Presence of intraocular lens: Secondary | ICD-10-CM | POA: Diagnosis not present

## 2016-12-03 DIAGNOSIS — H34812 Central retinal vein occlusion, left eye, with macular edema: Secondary | ICD-10-CM | POA: Diagnosis not present

## 2016-12-31 DIAGNOSIS — F411 Generalized anxiety disorder: Secondary | ICD-10-CM | POA: Diagnosis not present

## 2016-12-31 DIAGNOSIS — F331 Major depressive disorder, recurrent, moderate: Secondary | ICD-10-CM | POA: Diagnosis not present

## 2017-01-12 DIAGNOSIS — Z5181 Encounter for therapeutic drug level monitoring: Secondary | ICD-10-CM | POA: Diagnosis not present

## 2017-01-12 DIAGNOSIS — Z944 Liver transplant status: Secondary | ICD-10-CM | POA: Diagnosis not present

## 2017-01-12 DIAGNOSIS — E612 Magnesium deficiency: Secondary | ICD-10-CM | POA: Diagnosis not present

## 2017-01-19 DIAGNOSIS — N200 Calculus of kidney: Secondary | ICD-10-CM | POA: Diagnosis not present

## 2017-01-20 DIAGNOSIS — N200 Calculus of kidney: Secondary | ICD-10-CM | POA: Diagnosis not present

## 2017-01-22 DIAGNOSIS — H401131 Primary open-angle glaucoma, bilateral, mild stage: Secondary | ICD-10-CM | POA: Diagnosis not present

## 2017-01-22 DIAGNOSIS — H35352 Cystoid macular degeneration, left eye: Secondary | ICD-10-CM | POA: Diagnosis not present

## 2017-01-27 DIAGNOSIS — K760 Fatty (change of) liver, not elsewhere classified: Secondary | ICD-10-CM | POA: Diagnosis not present

## 2017-01-27 DIAGNOSIS — E559 Vitamin D deficiency, unspecified: Secondary | ICD-10-CM | POA: Diagnosis not present

## 2017-01-27 DIAGNOSIS — Z944 Liver transplant status: Secondary | ICD-10-CM | POA: Diagnosis not present

## 2017-01-27 DIAGNOSIS — L299 Pruritus, unspecified: Secondary | ICD-10-CM | POA: Diagnosis not present

## 2017-01-27 DIAGNOSIS — H9313 Tinnitus, bilateral: Secondary | ICD-10-CM | POA: Diagnosis not present

## 2017-01-27 DIAGNOSIS — R5383 Other fatigue: Secondary | ICD-10-CM | POA: Diagnosis not present

## 2017-01-27 DIAGNOSIS — G479 Sleep disorder, unspecified: Secondary | ICD-10-CM | POA: Diagnosis not present

## 2017-03-11 ENCOUNTER — Ambulatory Visit: Payer: Medicare Other | Admitting: Neurology

## 2017-03-11 DIAGNOSIS — F331 Major depressive disorder, recurrent, moderate: Secondary | ICD-10-CM | POA: Diagnosis not present

## 2017-03-12 DIAGNOSIS — H9113 Presbycusis, bilateral: Secondary | ICD-10-CM | POA: Diagnosis not present

## 2017-03-12 DIAGNOSIS — H9313 Tinnitus, bilateral: Secondary | ICD-10-CM | POA: Diagnosis not present

## 2017-03-12 DIAGNOSIS — H903 Sensorineural hearing loss, bilateral: Secondary | ICD-10-CM | POA: Diagnosis not present

## 2017-03-20 ENCOUNTER — Other Ambulatory Visit: Payer: Self-pay | Admitting: Neurology

## 2017-03-29 ENCOUNTER — Encounter: Payer: Self-pay | Admitting: Internal Medicine

## 2017-03-29 DIAGNOSIS — Z5181 Encounter for therapeutic drug level monitoring: Secondary | ICD-10-CM | POA: Diagnosis not present

## 2017-03-29 DIAGNOSIS — Z944 Liver transplant status: Secondary | ICD-10-CM | POA: Diagnosis not present

## 2017-03-29 DIAGNOSIS — K760 Fatty (change of) liver, not elsewhere classified: Secondary | ICD-10-CM | POA: Diagnosis not present

## 2017-03-29 DIAGNOSIS — E612 Magnesium deficiency: Secondary | ICD-10-CM | POA: Diagnosis not present

## 2017-03-30 ENCOUNTER — Ambulatory Visit
Admission: RE | Admit: 2017-03-30 | Discharge: 2017-03-30 | Disposition: A | Discharge status: Home / Self Care | Payer: MEDICARE

## 2017-03-30 ENCOUNTER — Ambulatory Visit
Admission: RE | Admit: 2017-03-30 | Discharge: 2017-03-30 | Disposition: A | Discharge status: Home / Self Care | Payer: MEDICARE | Attending: Gastroenterology | Admitting: Gastroenterology

## 2017-03-30 DIAGNOSIS — Z23 Encounter for immunization: Secondary | ICD-10-CM | POA: Diagnosis not present

## 2017-03-30 DIAGNOSIS — Z944 Liver transplant status: Secondary | ICD-10-CM | POA: Diagnosis not present

## 2017-03-30 DIAGNOSIS — D899 Disorder involving the immune mechanism, unspecified: Secondary | ICD-10-CM | POA: Diagnosis not present

## 2017-03-30 DIAGNOSIS — Z7982 Long term (current) use of aspirin: Secondary | ICD-10-CM | POA: Diagnosis not present

## 2017-03-30 DIAGNOSIS — C220 Liver cell carcinoma: Secondary | ICD-10-CM

## 2017-03-30 DIAGNOSIS — K760 Fatty (change of) liver, not elsewhere classified: Secondary | ICD-10-CM | POA: Diagnosis not present

## 2017-03-30 DIAGNOSIS — Z4823 Encounter for aftercare following liver transplant: Secondary | ICD-10-CM | POA: Diagnosis not present

## 2017-04-01 DIAGNOSIS — Z23 Encounter for immunization: Secondary | ICD-10-CM | POA: Diagnosis not present

## 2017-04-07 DIAGNOSIS — N200 Calculus of kidney: Secondary | ICD-10-CM | POA: Diagnosis not present

## 2017-04-13 DIAGNOSIS — F331 Major depressive disorder, recurrent, moderate: Secondary | ICD-10-CM | POA: Diagnosis not present

## 2017-04-13 DIAGNOSIS — F411 Generalized anxiety disorder: Secondary | ICD-10-CM | POA: Diagnosis not present

## 2017-04-15 DIAGNOSIS — F331 Major depressive disorder, recurrent, moderate: Secondary | ICD-10-CM | POA: Diagnosis not present

## 2017-04-20 DIAGNOSIS — F331 Major depressive disorder, recurrent, moderate: Secondary | ICD-10-CM | POA: Diagnosis not present

## 2017-05-05 ENCOUNTER — Encounter (INDEPENDENT_AMBULATORY_CARE_PROVIDER_SITE_OTHER): Payer: Self-pay

## 2017-05-05 ENCOUNTER — Encounter: Payer: Self-pay | Admitting: Neurology

## 2017-05-05 ENCOUNTER — Telehealth: Payer: Self-pay | Admitting: Neurology

## 2017-05-05 ENCOUNTER — Ambulatory Visit (INDEPENDENT_AMBULATORY_CARE_PROVIDER_SITE_OTHER): Payer: Medicare Other | Admitting: Neurology

## 2017-05-05 VITALS — BP 110/74 | HR 76 | Ht 63.0 in | Wt 161.0 lb

## 2017-05-05 DIAGNOSIS — G4710 Hypersomnia, unspecified: Secondary | ICD-10-CM

## 2017-05-05 NOTE — Patient Instructions (Addendum)
We will look into your daytime sleepiness with a nighttime sleep study, followed by a daytime nap study. As discussed, in preparation for sleep study testing, we will have to have you taper off of the Cymbalta, please discuss with your psychiatrist, whether it is okay for you to come off this medication. You will have to taper and then stay off of it for at least 2 weeks prior to sleep study testing.  I would be happy to order sleep study testing, when you are ready. Please call when you are ready to schedule.

## 2017-05-05 NOTE — Progress Notes (Signed)
Subjective:    Patient ID: Capria Cartaya is a 71 y.o. female.  HPI     Interim history:   Ms. Cammarata is a 71 year old right-handed woman with an underlying complex medical history of hypertension, depression, kidney stone, central retinal vein occlusion, TIA, hepatitis C, acid reflux disease, asthma, status post cholecystectomy, liver transplant, cataract surgeries, tonsillectomy, and overweight state, who presents for consultation of her sleep disturbance, including daytime somnolence. She is re-referred by her primary care physician for concern for narcolepsy. I last saw her on 08/15/2015, at which time we talked about her sleep study results from 06/26/2015. These did not indicate any obstructive sleep apnea but she had absence of REM sleep. She was on high-dose Effexor at the time. While she did have PLMs with mild arousals she did not endorse any restless leg symptoms at the time. She endorsed a lot of stressors at the time including residual depression, stress from her recent move, losing her half sister recently. She was supposed to see a new psychiatrist. She felt that she was moving a lot during the sleep study because she felt uncomfortable. She also reported that she did not sleep on her back typically.   Today, 05/05/2017: She reports having ongoing issues with depression. She has established care with a new psychiatrist. She is on Cymbalta which was recently increased. She feels that her depression is still not optimally controlled. She is not going to be able to come off of the Cymbalta at this time. She reports a several month history of daytime somnolence. She has been in the hospital 3 times this year. She has been sleepy during the day since January 2018 she feels. She can fall asleep in sedentary within minutes. She has always been a restless sleeper even before her liver transplant she recalls. She reports that at the time of her sleep study in January 2017 she was very restless.  She was not able to sleep on her back and there was noise in the sleep lab that bothered her.     The patient's allergies, current medications, family history, past medical history, past social history, past surgical history and problem list were reviewed and updated as appropriate.   Previously (copied from previous notes for reference):   I first met her on 04/25/2015 at the request of Dr. Krista Blue, at which time the patient reported excessive daytime somnolence. I invited her back for a sleep study. She had a baseline sleep study on 06/26/2015. I went over her test results with her in detail today. Sleep efficiency was markedly reduced at only 38.7% with a latency to sleep markedly prolonged at 121 minutes, wake after sleep onset was elevated at 151.5 minutes with moderate sleep fragmentation noted. She had an elevated arousal index, secondary to spontaneous arousals. She had increased percentages of stage I and stage II sleep, and absence of slow-wave sleep and absence of REM sleep. 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 She had no significant EKG or EEG changes. She had minimal intermittent snoring. AHI was 0 per hour. Average oxygen saturation was 96%, nadir was 94%.     04/25/2015: She reports excessive daytime somnolence. Her Epworth sleepiness score is 15 out of 24 today. Her fatigue score is 50/63.  I reviewed your office note from 04/01/2015. She feels too sleepy, since she had the TIAs since earlier this year. She reports occasional morning HAs, some nocturia once per night on average.  She denies RLS symptoms, and used to have sleep talking. She is twice divorced, and has a grown son in [place] and a daughter locally.   She is adopted and her biological mother died at 38 with AD.   She states that she was always a good sleeper until she had her TIAs. Sleep probably also got worse around her liver transplant time in 2009. She is not sure if she snores.  She lives alone. She does have additional stressors. She watches TV in bed but turns it off before falling asleep. She goes to bed at variable times, somewhere between 11:30 and 2 AM at times. Her rise time also varies. Usually she is out of bed by 10 AM. Does not drink any alcohol or caffeine and does not smoke cigarettes. She is retired.  Her Past Medical History Is Significant For: Past Medical History:  Diagnosis Date  . Abnormal uterine bleeding   . Acid reflux   . Asthma    [phone] due to black mold house, no asthma now  . Broken ankle    right  . Broken rib    x2  . Cancer ([place])    liver (in the one that was removed)  . Central retinal vein occlusion few yrs   left eye retinal occlusion  . Complication of anesthesia    IV phenergan  serious mental reaction  . Depression   . Hepatitis C    HEP C  . Hernia, femoral    left  . History of blood transfusion 1971   acquired hepatitis c  . History of kidney stones   . History of renal stone   . History of shingles   . Hypertension    due to profraf  . Renal stone 11/2012  . Seasonal affective disorder ([place])   . Stroke [facility])    TIA x2 caused by prednisone  . Tailbone injury age 28 and age 89 and few weeks ago   broken x3  . TIA (transient ischemic attack)   . 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 CATARACT EXTRACTION     bilateral  . CHOLECYSTECTOMY  1997  . CYSTOSCOPY W/ URETERAL STENT PLACEMENT Left 08/14/2016   Procedure: CYSTOSCOPY WITH RETROGRADE PYELOGRAM/URETERAL LEFT URETEROSCOPY AND LEFT STENT PLACEMENT;  Surgeon: Raynelle Bring, MD;  Location: [facility];  Service: Urology;  Laterality: Left;  . CYSTOSCOPY/URETEROSCOPY/HOLMIUM LASER/STENT PLACEMENT Left 07/06/2016   Procedure: CYSTOSCOPY/URETEROSCOPY/ BILATERAL RETROGRADE/HOLMIUM LASER/STENT  PLACEMENT/BASKET STONE REMOVAL;  Surgeon: Raynelle Bring, MD;  Location: [facility];  Service: Urology;  Laterality: Left;  . CYSTOSCOPY/URETEROSCOPY/HOLMIUM LASER/STENT PLACEMENT Left 08/06/2016   Procedure: CYSTOSCOPY/URETEROSCOPY/HOLMIUM LASER/STENT PLACEMENT;  Surgeon: Raynelle Bring, MD;  Location: [facility];  Service: Urology;  Laterality: Left;  . CYSTOSCOPY/URETEROSCOPY/HOLMIUM LASER/STENT PLACEMENT Left 08/31/2016   Procedure: CYSTOSCOPY/URETEROSCOPY/HOLMIUM LASER/STENT PLACEMENT/BASKET STONE REMOVAL;  Surgeon: Raynelle Bring, MD;  Location: [facility];  Service: Urology;  Laterality: Left;  . FEMORAL HERNIA REPAIR  1997   left leg  . NASAL SINUS SURGERY  1997   x3  . TONSILLECTOMY  age 76  . 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 TUBAL LIGATION      Her Family History Is Significant For: Family History  Adopted: Yes  Problem Relation Age of Onset  . Stroke Mother   . Alzheimer's disease Mother   . Alcoholism Father  Her Social History Is Significant For: Social History   Socioeconomic History  . Marital status: Divorced    Spouse name: None  . Number of children: 2  . Years of education: 80  . Highest education level: None  Social Needs  . Financial resource strain: None  . Food insecurity - worry: None  . Food insecurity - inability: None  . Transportation needs - medical: None  . Transportation needs - non-medical: None  Occupational History  . Occupation: Disabled  Tobacco Use  . Smoking status: Never Smoker  . Smokeless tobacco: Never Used  Substance and Sexual Activity  . Alcohol use: No  . Drug use: No  . Sexual activity: No    Comment: 14 years ago  Other Topics Concern  . None  Social History Narrative   Lives at alone alone.   Right-handed.   No caffeine use.    Her Allergies Are:  Allergies  Allergen Reactions  . Codeine Nausea Only  . 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    Marland Kitchen Penicillins Nausea Only and Other  (See Comments)    Has patient had a PCN reaction causing immediate rash, facial/tongue/throat swelling, SOB or lightheadedness with hypotension: No Has patient had a PCN reaction causing severe rash involving mucus membranes or skin necrosis: No Has patient had a PCN reaction that required hospitalization No Has patient had a PCN reaction occurring within the last 10 years: No If all of the above answers are "NO", then may proceed with Cephalosporin use.  Marland Kitchen Phenergan [Promethazine] Other (See Comments)    Reaction:  Hallucinations  Pt states that she is only allergic to IV form.   . Sulfa Antibiotics Itching  . Tamsulosin Itching  . Lidocaine Palpitations  :   Her Current Medications Are:  Outpatient Encounter Medications as of 05/05/2017  Medication Sig  . acetaminophen (TYLENOL) 500 MG tablet Take 500-1,000 mg by mouth every 6 (six) hours as needed for mild pain, moderate pain or headache.   Marland Kitchen aspirin EC 81 MG tablet Take 81 mg by mouth daily.   . brimonidine (ALPHAGAN P) 0.1 % SOLN Place 1 drop into both eyes 2 (two) times daily.  . ciprofloxacin (CIPRO) 250 MG tablet Take 1 tablet (250 mg total) by mouth 2 (two) times daily.  . ciprofloxacin (CIPRO) 500 MG tablet Take 1 tablet (500 mg total) by mouth 2 (two) times daily.  . clindamycin (CLEOCIN) 300 MG capsule Take 1 capsule (300 mg total) by mouth 3 (three) times daily. X 5 days for infection  . DULoxetine (CYMBALTA) 60 MG capsule Take 60 mg by mouth daily. Takes at [employer]  . ergocalciferol (VITAMIN D2) 50000 units capsule Take 50,000 Units by mouth every 14 (fourteen) days.   . ferrous sulfate 325 (65 FE) MG EC tablet Take 325 mg by mouth daily.   . folic acid (FOLVITE) 570 MCG tablet Take 400 mcg by mouth daily.   . phenazopyridine (PYRIDIUM) 100 MG tablet Take 1 tablet (100 mg total) by mouth 3 (three) times daily as needed for pain (for burning).  . potassium citrate (UROCIT-K) 10 MEQ (1080 MG) SR tablet Take 10 mEq by mouth 2  (two) times daily. Takes at 2pm and 200am  . propranolol ER (INDERAL LA) 80 MG 24 hr capsule TAKE ONE CAPSULE BY MOUTH DAILY  . pyridOXINE (VITAMIN B-6) 100 MG tablet Take 100 mg by mouth daily.   . rizatriptan (MAXALT-MLT)  5 MG disintegrating tablet Take 1 tablet (5 mg total) by mouth as needed. May repeat in 2 hours if needed (Patient taking differently: Take 5 mg by mouth as needed for migraine. May repeat in 2 hours if needed)  . tacrolimus (PROGRAF) 0.5 MG capsule Take 1.5 mg by mouth 2 (two) times daily. Takes at 2pm and 2am  . tamsulosin (FLOMAX) 0.4 MG CAPS capsule Take 0.4 mg by mouth at bedtime. Patient does not take at this time  . traMADol (ULTRAM) 50 MG tablet Take 1 tablet (50 mg total) by mouth every 6 (six) hours as needed.  . traMADol (ULTRAM) 50 MG tablet Take 1 tablet (50 mg total) by mouth every 6 (six) hours as needed. Can take 1-2 tablets every 6 hours as needed.   No facility-administered encounter medications on file as of 05/05/2017.   :  Review of Systems:  Out of a complete 14 point review of systems, all are reviewed and negative with the exception of these symptoms as listed below: Review of Systems  Neurological:       Pt presents today to discuss her sleep. Pt has concerns about restless legs.    Objective:  Neurological Exam  Physical Exam Physical Examination:   Vitals:   05/05/17 1305  BP: 110/74  Pulse: 76    General Examination: The patient is a 71 y.o. female in no acute distress. She appears well-developed and well-nourished and well groomed.   HEENT: Normocephalic, atraumatic, pupils are equal, round and reactive to light and accommodation. Extraocular tracking is good without limitation to gaze excursion or nystagmus noted. Normal smooth pursuit is noted. Hearing is grossly intact. Face is symmetric with normal facial animation and normal facial sensation. Speech is clear with no dysarthria noted. There is no hypophonia. There is no lip,  neck/head, jaw or voice tremor. Oropharynx exam reveals: no focal findings.   Chest: Clear to auscultation without wheezing, rhonchi or crackles noted.  Heart: S1+S2+0, regular and normal without murmurs, rubs or gallops noted.   Abdomen: Soft, non-tender and non-distended but mildly protruberent, with normal bowel sounds appreciated on auscultation.  Extremities: There is no obvious change.   Skin: Warm and dry without trophic changes noted. There are no varicose veins.  Musculoskeletal: exam reveals no obvious joint deformities, tenderness or joint swelling or erythema.   Neurologically:  Mental status: The patient is awake, alert and oriented in all 4 spheres. Her immediate and remote memory, attention, language skills and fund of knowledge are appropriate. There is no evidence of aphasia, agnosia, apraxia or anomia. Speech is clear with normal prosody and enunciation, slightly pressured. Thought process is linear. Cranial nerves II - XII are as described above under HEENT exam.  Motor exam: Normal bulk, strength and tone is noted. There is no drift, tremor or rebound. Reflexes are 1+ throughout. Fine motor skills and coordination: intact with normal finger taps, normal hand movements, normal rapid alternating patting, normal foot taps and normal foot agility.  Cerebellar testing: No dysmetria or intention tremor. There is no truncal or gait ataxia.  Sensory exam: intact to light touch.  Gait, station and balance: She stands easily. Gait unremarkable.   Assessment and Plan:  In summary, Dianna Ewald is a very pleasant 71 year old female with an underlying complex medical history of hypertension, depression, kidney stone, central retinal vein occlusion, TIA, hepatitis C, acid reflux disease, asthma, status post cholecystectomy, liver transplant, cataract surgeries, recent  tonsillectomy, and overweight state, who presents for reevaluation  of her sleep disturbance. She reports  daytime somnolence for the past several months, noticed it since January 2018. She has had some medication changes. She has been in the hospital this year. She reports residual depression and recent increase in her Cymbalta. I explained to her that in order to properly evaluate her for excessive sleepiness and an underlying potential hypersomnolence disorder we would have to proceed with a overnight polysomnogram followed by a next day nap study. In order to schedule her for these tests she would have to taper off of the Cymbalta. I do understand that this is not the right time for her to come off of her antidepressant. She is not sure that she can come off of this anytime soon. She is encouraged to discuss this with her psychiatrist as well. She had a baseline sleep study in January 2017 which was limited in that her sleep efficiency was 38.7%, she had a long sleep latency of over 2 hours, she had absence of slow-wave sleep and REM sleep at the time. She was also on Effexor at the time. She did not have any sleep disordered breathing at the time, denied restless leg symptoms. She reports being a restless sleeper for as long as she can remember, also before her liver transplant. We mutually agreed that she would call us when she is able to schedule her sleep study and when she is able to taper off her Cymbalta. At this juncture, I will see her back as needed.  She is agreeable that I will copy her primary care physician as well as her psychiatrist on this note.

## 2017-05-05 NOTE — Telephone Encounter (Signed)
Pt has called back stating its a little hard to explain the reason for her call back without having to go over the entire conversation she had with Dr Rexene Alberts.  Pt states that she doesn't understand why Dr Rexene Alberts did not understand why she was in office today.  Pt stated she never wanted the sleep study in the past, she states they were done because of the mini strokes she has had in the past.  Pt states another problem is she is sleeping too much, she needs the other test for pt's who sleep too much.  Pt stated she was told by Dr Rexene Alberts that she would need to come off of her medication for depression but pt states she is just not able to do that at this time, she is suffering from depression.  Pt again stated she is not clear as to why Dr Rexene Alberts did not understand why she was in for an appointment today.  Pt was advised that RN Cyril Mourning and Dr Rexene Alberts are with pt's and that it it possible that she will not get a call back today, pt stated that is okay and would like a call back sometime next week

## 2017-05-05 NOTE — Telephone Encounter (Signed)
I called pt to discuss. No answer, left a message asking her to call me back. 

## 2017-05-05 NOTE — Telephone Encounter (Signed)
Pt has more questions for Dr. Rexene Alberts that she was not able to ask during the appt. Regarding sleep test she had already had and her mini strokes. Thank you.

## 2017-05-11 DIAGNOSIS — F331 Major depressive disorder, recurrent, moderate: Secondary | ICD-10-CM | POA: Diagnosis not present

## 2017-05-11 NOTE — Telephone Encounter (Signed)
Left message for patient to call me back regarding her questions on a sleep study.

## 2017-05-18 DIAGNOSIS — Z5181 Encounter for therapeutic drug level monitoring: Secondary | ICD-10-CM | POA: Diagnosis not present

## 2017-05-18 DIAGNOSIS — E612 Magnesium deficiency: Secondary | ICD-10-CM | POA: Diagnosis not present

## 2017-05-18 DIAGNOSIS — Z944 Liver transplant status: Secondary | ICD-10-CM | POA: Diagnosis not present

## 2017-05-21 ENCOUNTER — Other Ambulatory Visit: Payer: Self-pay | Admitting: Neurology

## 2017-06-17 DIAGNOSIS — H26491 Other secondary cataract, right eye: Secondary | ICD-10-CM | POA: Diagnosis not present

## 2017-06-17 DIAGNOSIS — H53002 Unspecified amblyopia, left eye: Secondary | ICD-10-CM | POA: Insufficient documentation

## 2017-06-17 DIAGNOSIS — Z961 Presence of intraocular lens: Secondary | ICD-10-CM | POA: Diagnosis not present

## 2017-06-17 DIAGNOSIS — H34812 Central retinal vein occlusion, left eye, with macular edema: Secondary | ICD-10-CM | POA: Diagnosis not present

## 2017-06-17 HISTORY — DX: Unspecified amblyopia, left eye: H53.002

## 2017-06-25 DIAGNOSIS — Z944 Liver transplant status: Secondary | ICD-10-CM | POA: Diagnosis not present

## 2017-06-25 DIAGNOSIS — E612 Magnesium deficiency: Secondary | ICD-10-CM | POA: Diagnosis not present

## 2017-06-25 DIAGNOSIS — Z5181 Encounter for therapeutic drug level monitoring: Secondary | ICD-10-CM | POA: Diagnosis not present

## 2017-06-26 ENCOUNTER — Other Ambulatory Visit: Payer: Self-pay | Admitting: Neurology

## 2017-06-30 DIAGNOSIS — F331 Major depressive disorder, recurrent, moderate: Secondary | ICD-10-CM | POA: Diagnosis not present

## 2017-07-01 ENCOUNTER — Other Ambulatory Visit: Payer: Self-pay | Admitting: Neurology

## 2017-07-05 ENCOUNTER — Other Ambulatory Visit: Payer: Self-pay | Admitting: *Deleted

## 2017-07-05 MED ORDER — PROPRANOLOL HCL ER 80 MG PO CP24: 80.0000 mg | ORAL_CAPSULE | Freq: Every day | ORAL | 3 refills | Status: DC

## 2017-07-07 ENCOUNTER — Telehealth: Payer: Self-pay | Admitting: Neurology

## 2017-07-07 NOTE — Telephone Encounter (Signed)
Pt is requesting to see Dr. Krista Blue but wants to be seen soon(PCP is also telling pt to see Krista Blue not Athar for this) re propranolol ER (INDERAL LA) 80 MG 24 hr capsule pt is just feeling horrible on them but she isnt able to get refill till she has an appt. Please contact

## 2017-07-07 NOTE — Telephone Encounter (Signed)
Spoke to patient - she was last seen by Dr. Krista Blue for migraines in 06/2015.  She is still using propranolol ER for preventive medication and rizatriptan for rescue.  She is going to speak with her PCP to take over these prescriptions.  If her PCP is not willing, she will call back to schedule a follow up with Dr. Krista Blue.  If patient calls back, please schedule her with Dr. Krista Blue in her next available office visit slot.

## 2017-07-08 DIAGNOSIS — R5383 Other fatigue: Secondary | ICD-10-CM | POA: Diagnosis not present

## 2017-07-08 DIAGNOSIS — F322 Major depressive disorder, single episode, severe without psychotic features: Secondary | ICD-10-CM | POA: Diagnosis not present

## 2017-07-09 DIAGNOSIS — F331 Major depressive disorder, recurrent, moderate: Secondary | ICD-10-CM | POA: Diagnosis not present

## 2017-07-09 DIAGNOSIS — F411 Generalized anxiety disorder: Secondary | ICD-10-CM | POA: Diagnosis not present

## 2017-07-12 ENCOUNTER — Telehealth: Payer: Self-pay | Admitting: Neurology

## 2017-07-12 DIAGNOSIS — G4710 Hypersomnia, unspecified: Secondary | ICD-10-CM

## 2017-07-12 NOTE — Telephone Encounter (Signed)
I spoke with Dr. Rexene Alberts. She recommends that pt call back after she has been off of the cymbalta for 14 days. At that time, Dr. Rexene Alberts will consider ordering her a sleep study. Pt denies taking pain medication and is not sure why tramadol is still on her medication list. Pt will call us when she has been off of her cymbalta for 14 days.  Pt says that she cannot sleep on her back and has some concerns she has about the next sleep study that she would like to discuss with our sleep lab manager. I will ask Shirlean Mylar to call the pt and discuss.

## 2017-07-12 NOTE — Telephone Encounter (Signed)
Pt called per her psychiatrist on Friday 07/09/17 she is able to taper off depression medication for the sleep study. She started to taper off DULoxetine (CYMBALTA) 60 MG capsule, she was taking 3 tabs/day, as of yesterday she is 2/day x 1week then 1 tab x 1 week, then stop. Pt is wanting to get sleep study scheduled per last OV/05/05/17 with Dr Rexene Alberts . Please call to advise.

## 2017-07-13 NOTE — Addendum Note (Signed)
Addended by: Lester Trotwood A on: 07/13/2017 04:10 PM   Modules accepted: Orders

## 2017-07-13 NOTE — Telephone Encounter (Signed)
Received VO from Dr. Rexene Alberts to order pt's PSG and MSLT.

## 2017-07-21 MED ORDER — PROGRAF 0.5 MG CAPSULE
ORAL_CAPSULE | Freq: Two times a day (BID) | ORAL | 11 refills | 0.00000 days | Status: CP
Start: 2017-07-21 — End: 2017-07-27

## 2017-07-22 DIAGNOSIS — M79671 Pain in right foot: Secondary | ICD-10-CM | POA: Diagnosis not present

## 2017-07-22 DIAGNOSIS — M79601 Pain in right arm: Secondary | ICD-10-CM | POA: Diagnosis not present

## 2017-07-26 DIAGNOSIS — H401131 Primary open-angle glaucoma, bilateral, mild stage: Secondary | ICD-10-CM | POA: Diagnosis not present

## 2017-07-26 DIAGNOSIS — H35352 Cystoid macular degeneration, left eye: Secondary | ICD-10-CM | POA: Diagnosis not present

## 2017-07-27 MED ORDER — PROGRAF 0.5 MG CAPSULE
ORAL_CAPSULE | Freq: Two times a day (BID) | ORAL | 11 refills | 0.00000 days | Status: CP
Start: 2017-07-27 — End: 2018-09-09

## 2017-08-05 DIAGNOSIS — Z5181 Encounter for therapeutic drug level monitoring: Secondary | ICD-10-CM | POA: Diagnosis not present

## 2017-08-05 DIAGNOSIS — E612 Magnesium deficiency: Secondary | ICD-10-CM | POA: Diagnosis not present

## 2017-08-05 DIAGNOSIS — Z944 Liver transplant status: Secondary | ICD-10-CM | POA: Diagnosis not present

## 2017-08-10 ENCOUNTER — Telehealth: Payer: Self-pay

## 2017-08-10 NOTE — Telephone Encounter (Signed)
Spoke with patient on 08-09-17 to confirm patient had been off her anti depressant for at least two weeks. She said she had but she could not come off her anti injection. I told her this was not the same meds and we did not want her to come off of this. Explained procedure again and she seemed confused on why we were doing this. I tried to explain but she did not want to hear explanation. When she was scheduled all of this was covered. I scheduled her sleep study weeks ahead since she was not off anti depressant. It was said many times to come off.Patient was aware that study cannot be done if on anti depressant.

## 2017-08-10 NOTE — Telephone Encounter (Signed)
Pt had a PSG sleep study scheduled for 08/09/17 followed by a MSLT on 08/10/17.  Pt was scheduled for a 9:30pm check.  At 2150, tech called pt. Pt stated that she had misplaced her keys and would arriving soon. Pt arrived at 2210. Once arrived, tech wanted to confirm about being off Cymbalta. Pt admitted to only being off 1 week.  Manager notified and stated test couldn't be perform.  As tech was relaying info to pt, she stated that she didn't want to be here anyways.

## 2017-08-16 DIAGNOSIS — F331 Major depressive disorder, recurrent, moderate: Secondary | ICD-10-CM | POA: Diagnosis not present

## 2017-09-10 ENCOUNTER — Ambulatory Visit: Payer: Medicare Other | Admitting: Internal Medicine

## 2017-09-28 DIAGNOSIS — F331 Major depressive disorder, recurrent, moderate: Secondary | ICD-10-CM | POA: Diagnosis not present

## 2017-09-28 DIAGNOSIS — F411 Generalized anxiety disorder: Secondary | ICD-10-CM | POA: Diagnosis not present

## 2017-10-12 ENCOUNTER — Encounter: Payer: Self-pay | Admitting: Internal Medicine

## 2017-10-12 ENCOUNTER — Ambulatory Visit (INDEPENDENT_AMBULATORY_CARE_PROVIDER_SITE_OTHER): Payer: Medicare Other | Admitting: Internal Medicine

## 2017-10-12 VITALS — BP 130/80 | HR 91 | Temp 98.2°F | Ht 62.0 in | Wt 161.6 lb

## 2017-10-12 DIAGNOSIS — H34812 Central retinal vein occlusion, left eye, with macular edema: Secondary | ICD-10-CM | POA: Diagnosis not present

## 2017-10-12 DIAGNOSIS — I10 Essential (primary) hypertension: Secondary | ICD-10-CM

## 2017-10-12 DIAGNOSIS — Z87442 Personal history of urinary calculi: Secondary | ICD-10-CM | POA: Diagnosis not present

## 2017-10-12 DIAGNOSIS — K219 Gastro-esophageal reflux disease without esophagitis: Secondary | ICD-10-CM

## 2017-10-12 DIAGNOSIS — Z8619 Personal history of other infectious and parasitic diseases: Secondary | ICD-10-CM

## 2017-10-12 DIAGNOSIS — F338 Other recurrent depressive disorders: Secondary | ICD-10-CM

## 2017-10-12 DIAGNOSIS — Z944 Liver transplant status: Secondary | ICD-10-CM | POA: Diagnosis not present

## 2017-10-12 NOTE — Patient Instructions (Addendum)
Get old records  BRING MEDICATION BOTTLES TO NEXT APPT  Continue current medications as ordered  Follow up in 2 mos for AWV/EV/ECG. Fasting labs prior to next visit

## 2017-10-12 NOTE — Progress Notes (Signed)
Patient ID: Candace Hill, female   DOB: 12-23-1945, 72 y.o.   MRN: [id number]   Location:  [facility] OFFICE  Provider: DR Arletha Grippe  Code Status:  Goals of Care:  Advanced Directives 08/31/2016  Does Patient Have a Medical Advance Directive? No  Type of Advance Directive -  Does patient want to make changes to medical advance directive? No - Patient declined  Copy of [place] in Chart? -  Would patient like information on creating a medical advance directive? No - Patient declined  Pre-existing out of facility DNR order (yellow form or pink MOST form) -     Chief Complaint  Patient presents with  . Establish Care    NP  . Medication Refill    No Refills; Pt did not bring her medications with her    HPI: Patient is a 72 y.o. female seen today as a new pt.   Hx hepatic cirrhosis/chronic Hep C hx - s/p liver tx 02/17/2008. Followed by [facility] transplant team Dr Lemmie Evens. Currently takes prograf.  She was tx and cured Hepatitis C by [facility]. LFTs remain slightly elevated (AST 58; ALT 83). Hep C contracted following blood transfusion after SVD in 1950s but was not dx until 1997.  Glaucoma/CRVO OS - tx with eye gtts. 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 She is followed by [facility] Dr Cordelia Pen (ophthamology)  HTN - takes metoprolol and propanolol. She also takes ASA daily  CKD - stage 2. Cr 1.16  Hx nephrolithiasis - followed by [facility] Dr Alinda Money. She has had stents and lithotripsy in the past  Hx Vit D deficiency - takes weekly Vit D 50K units  GERD - stable on omeprazole  Seasonal affective d/o - followed by psychologist. She takes cymbalta. effexor xr ineffective   Hx anemia - stable on iron and folate  TIA (dx in 2016)/hx HA/excessive daytime sleepiness - followed by neurology Dr Rexene Alberts. She was to go for sleep study to r/o OSA but could not complete study. She has taken maxalt prn HA  She is adopted but is aware of birthmother's med hx including Alzheimer's, stroke,  DM and HTN   Past Medical History:  Diagnosis Date  . Abnormal uterine bleeding   . Acid reflux   . Asthma    [phone] due to black mold house, no asthma now  . Broken ankle    right  . Broken rib    x2  . Cancer ([place])    liver (in the one that was removed)  . Central retinal vein occlusion few yrs   left eye retinal occlusion  . Complication of anesthesia    IV phenergan  serious mental reaction  . Depression   . Hepatitis C    HEP C  . Hernia, femoral    left  . History of blood transfusion 1971   acquired hepatitis c  . History of kidney stones   . History of renal stone   . History of shingles   . Hypertension    due to profraf  . Kidney stones    x 4 per new patient packet   . Renal stone 11/2012  . Seasonal affective disorder ([place])   . Stroke [facility])    TIA x2 caused by prednisone  . Tailbone injury age 41 and age 56 and few weeks ago   broken x3  . TIA (transient ischemic attack)   . 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 CATARACT EXTRACTION     bilateral  . CHOLECYSTECTOMY  1997  . CYSTOSCOPY W/ URETERAL STENT PLACEMENT Left 08/14/2016   Procedure: CYSTOSCOPY WITH RETROGRADE PYELOGRAM/URETERAL LEFT URETEROSCOPY AND LEFT STENT PLACEMENT;  Surgeon: Raynelle Bring, MD;  Location: [facility] ORS;  Service: Urology;  Laterality: Left;  . CYSTOSCOPY/URETEROSCOPY/HOLMIUM LASER/STENT PLACEMENT Left 07/06/2016   Procedure: CYSTOSCOPY/URETEROSCOPY/ BILATERAL RETROGRADE/HOLMIUM LASER/STENT PLACEMENT/BASKET STONE REMOVAL;  Surgeon: Raynelle Bring, MD;  Location: [facility];  Service: Urology;  Laterality: Left;  . CYSTOSCOPY/URETEROSCOPY/HOLMIUM LASER/STENT PLACEMENT Left 08/06/2016   Procedure: CYSTOSCOPY/URETEROSCOPY/HOLMIUM LASER/STENT PLACEMENT;  Surgeon: Raynelle Bring, MD;  Location: [facility];  Service: Urology;  Laterality: Left;  . CYSTOSCOPY/URETEROSCOPY/HOLMIUM  LASER/STENT PLACEMENT Left 08/31/2016   Procedure: CYSTOSCOPY/URETEROSCOPY/HOLMIUM LASER/STENT PLACEMENT/BASKET STONE REMOVAL;  Surgeon: Raynelle Bring, MD;  Location: [facility];  Service: Urology;  Laterality: Left;  . FEMORAL HERNIA REPAIR  1997   left leg  . NASAL SINUS SURGERY  1997   x3  . TONSILLECTOMY  age 86  . 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 TUBAL LIGATION       reports that she has never smoked. She has never used smokeless tobacco. She reports that she does not drink alcohol or use drugs. Social History   Socioeconomic History  . Marital status: Divorced    Spouse name: Not on file  . Number of children: 2  . Years of education: 2  . Highest education level: Not on file  Occupational History  . Occupation: Disabled  Social Needs  . Financial resource strain: Not on file  . Food insecurity:    Worry: Not on file    Inability: Not on file  . Transportation needs:    Medical: Not on file    Non-medical: Not on file  Tobacco Use  . Smoking status: Never Smoker  . Smokeless tobacco: Never Used  Substance and Sexual Activity  . Alcohol use: No  . Drug use: No  . Sexual activity: Never    Comment: 14 years ago  Lifestyle  . Physical activity:    Days per week: Not on file    Minutes per session: Not on file  . Stress: Not on file  Relationships  . Social connections:    Talks on phone: Not on file    Gets together: Not on file    Attends religious service: Not on file    Active member of club or organization: Not on file    Attends meetings of clubs or organizations: Not on file    Relationship status: Not on file  . Intimate partner violence:    Fear of current or ex partner: Not on file    Emotionally abused: Not on file    Physically abused: Not on file    Forced sexual activity: Not on file  Other Topics Concern  . Not on file  Social History Narrative   Lives at alone alone.   Right-handed.   No caffeine use.      PSC- as of 10/12/17   Diet: No Fried  Foods       Caffeine: Summer time, sweet tea       Married, if yes what year: Divorced       Do you live in a house, apartment, assisted living, condo, trailer, ect: [place], 1 stories, and 1 person       Pets: 1 dog  Current/Past profession: Retail buyer       Exercise: Some, walking dog       Living Will: yes   DNR: yes   POA/HPOA: yes      Functional Status: Patient did not answer   Do you have difficulty bathing or dressing yourself?   Do you have difficulty preparing food or eating?   Do you have difficulty managing your medications?   Do you have difficulty managing your finances?   Do you have difficulty affording your medications?    Family History  Adopted: Yes  Problem Relation Age of Onset  . Stroke Mother   . Alzheimer's disease Mother   . Cancer Mother   . Diabetes Mother        Questionable, per new patient packet   . Arthritis Mother        Questionable, per new patient packet   . Alcoholism Father   . COPD Brother        Heavy smoker, per new patient packet   . Suicidality Sister   . Depression Sister   . COPD Sister   . OCD Daughter   . Personality disorder Daughter   . Alcoholism Son     Allergies  Allergen Reactions  . Codeine Nausea Only  . 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    Marland Kitchen Penicillins Nausea Only and Other (See Comments)    Has patient had a PCN reaction causing immediate rash, facial/tongue/throat swelling, SOB or lightheadedness with hypotension: No Has patient had a PCN reaction causing severe rash involving mucus membranes or skin necrosis: No Has patient had a PCN reaction that required hospitalization No Has patient had a PCN reaction occurring within the last 10 years: No If all of the above answers are "NO", then may proceed with Cephalosporin use.  Marland Kitchen Phenergan [Promethazine] Other (See Comments)    Reaction:  Hallucinations  Pt states that she is only allergic to  IV form.   . Sulfa Antibiotics Itching  . Tamsulosin Itching  . Lidocaine Palpitations    Outpatient Encounter Medications as of 10/12/2017  Medication Sig  . acetaminophen (TYLENOL) 500 MG tablet Take 500-1,000 mg by mouth every 6 (six) hours as needed for mild pain, moderate pain or headache.   . DULoxetine (CYMBALTA) 60 MG capsule Take 60 mg by mouth daily. Takes at [employer]  . ergocalciferol (VITAMIN D2) 50000 units capsule Take 50,000 Units by mouth every 14 (fourteen) days.   . hydrOXYzine (ATARAX/VISTARIL) 25 MG tablet Take 25 mg by mouth every 6 (six) hours as needed for itching.  . potassium citrate (UROCIT-K) 10 MEQ (1080 MG) SR tablet Take 10 mEq by mouth 2 (two) times daily. Takes at 2pm and 200am  . propranolol ER (INDERAL LA) 80 MG 24 hr capsule Take 1 capsule (80 mg total) by mouth daily. Please call [phone] to schedule an appt with Dr. Krista Blue.  . rizatriptan (MAXALT-MLT) 5 MG disintegrating tablet Take 1 tablet (5 mg total) by mouth as needed. May repeat in 2 hours if needed (Patient taking differently: Take 5 mg by mouth as needed for migraine. May repeat in 2 hours if needed)  . tacrolimus (PROGRAF) 0.5 MG capsule Take 1.5 mg by mouth 2 (two) times daily. Takes at 2pm and 2am   No facility-administered encounter medications on file as of 10/12/2017.     Review of Systems:  Review of Systems  Constitutional: Positive for fatigue.  HENT: Positive for sinus pain and tinnitus.        Dry mouth  Eyes: Positive for visual disturbance (L>R).       Dry  Genitourinary: Positive for decreased urine volume (weak stream).  Musculoskeletal: Positive for back pain.       Joint stiffness  Skin: Positive for color change.  Neurological: Positive for weakness and headaches.       Confusion  Psychiatric/Behavioral: Positive for dysphoric mood. The patient is nervous/anxious.   All other systems reviewed and are negative. per NP packet  Health Maintenance  Topic Date Due  .  TETANUS/TDAP  10/17/1966  . PNA vac Low Risk Adult (1 of 2 - PCV13) 10/16/2012  . MAMMOGRAM  08/10/2016  . INFLUENZA VACCINE  01/13/2018  . COLONOSCOPY  01/26/2023  . DEXA SCAN  Completed  . Hepatitis C Screening  Completed    Physical Exam: Vitals:   10/12/17 1106  BP: 130/80  Pulse: 91  Temp: 98.2 F (36.8 C)  TempSrc: Oral  SpO2: 98%  Weight: 161 lb 9.6 oz (73.3 kg)  Height: 5\' 2"  (1.575 m)   Body mass index is 29.56 kg/m. Physical Exam  Constitutional: She is oriented to person, place, and time. She appears well-developed and well-nourished.  HENT:  Mouth/Throat: Oropharynx is clear and moist. No oropharyngeal exudate.  Eyes: Pupils are equal, round, and reactive to light. No scleral icterus.  Neck: Neck supple. Carotid bruit is not present. No tracheal deviation present. No thyromegaly present.  Cardiovascular: Normal rate, regular rhythm, normal heart sounds and intact distal pulses. Exam reveals no gallop and no friction rub.  No murmur heard. No LE edema b/l. no calf TTP.   Pulmonary/Chest: Effort normal and breath sounds normal. No stridor. No respiratory distress. She has no wheezes. She has no rales.  Abdominal: Soft. Bowel sounds are normal. She exhibits no distension and no mass. There is no hepatomegaly. There is tenderness (epigastric). There is no rebound and no guarding.  Lymphadenopathy:    She has no cervical adenopathy.  Neurological: She is alert and oriented to person, place, and time. She has normal reflexes.  Skin: Skin is warm and dry. No rash noted.  Psychiatric: Her behavior is normal. Judgment and thought content normal. Her mood appears anxious. Her speech is rapid and/or pressured and tangential.    Labs reviewed: Basic Metabolic Panel: No results for input(s): NA, K, CL, CO2, GLUCOSE, BUN, CREATININE, CALCIUM, MG, PHOS, TSH in the last 8760 hours. Liver Function Tests: No results for input(s): AST, ALT, ALKPHOS, BILITOT, PROT, ALBUMIN in the  last 8760 hours. No results for input(s): LIPASE, AMYLASE in the last 8760 hours. No results for input(s): AMMONIA in the last 8760 hours. CBC: No results for input(s): WBC, NEUTROABS, HGB, HCT, MCV, PLT in the last 8760 hours. Lipid Panel: No results for input(s): CHOL, HDL, LDLCALC, TRIG, CHOLHDL, LDLDIRECT in the last 8760 hours. No results found for: HGBA1C  Procedures since last visit: No results found.  Assessment/Plan   ICD-10-CM   1. Seasonal affective disorder (HCC) F33.8   2. 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 Essential hypertension I10   4. Gastroesophageal reflux disease, esophagitis presence not specified K21.9   5. 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 H34.8120   6. History of hepatitis C Z86.19   7. History of nephrolithiasis Z95.442    Get old records  BRING MEDICATION BOTTLES TO NEXT APPT  Continue current medications as ordered  Follow up in 2 mos for AWV/EV/ECG. Fasting labs prior to next visit     Mayuri Staples S. Perlie Gold  [facility] [address] [place], [place] [address] [phone] Cell (Monday-Friday 8 AM - 5 PM) [phone] After 5 PM and follow prompts

## 2017-10-14 DIAGNOSIS — I10 Essential (primary) hypertension: Secondary | ICD-10-CM

## 2017-10-14 DIAGNOSIS — Z8619 Personal history of other infectious and parasitic diseases: Secondary | ICD-10-CM | POA: Insufficient documentation

## 2017-10-14 DIAGNOSIS — Z87442 Personal history of urinary calculi: Secondary | ICD-10-CM | POA: Insufficient documentation

## 2017-10-14 DIAGNOSIS — K219 Gastro-esophageal reflux disease without esophagitis: Secondary | ICD-10-CM

## 2017-10-14 HISTORY — DX: Personal history of urinary calculi: Z87.442

## 2017-10-14 HISTORY — DX: Essential (primary) hypertension: I10

## 2017-10-14 HISTORY — DX: Gastro-esophageal reflux disease without esophagitis: K21.9

## 2017-10-25 DIAGNOSIS — F331 Major depressive disorder, recurrent, moderate: Secondary | ICD-10-CM | POA: Diagnosis not present

## 2017-11-24 ENCOUNTER — Telehealth: Payer: Self-pay | Admitting: Internal Medicine

## 2017-11-24 DIAGNOSIS — Z5181 Encounter for therapeutic drug level monitoring: Secondary | ICD-10-CM | POA: Diagnosis not present

## 2017-11-24 DIAGNOSIS — Z944 Liver transplant status: Secondary | ICD-10-CM | POA: Diagnosis not present

## 2017-11-24 DIAGNOSIS — E612 Magnesium deficiency: Secondary | ICD-10-CM | POA: Diagnosis not present

## 2017-11-24 NOTE — Telephone Encounter (Signed)
Left msg asking pt to confirm this AWV-S w/ nurse on same day as scheduled labs. Last AWV 01/23/16. VDM (DD)

## 2017-11-29 DIAGNOSIS — M79671 Pain in right foot: Secondary | ICD-10-CM | POA: Diagnosis not present

## 2017-11-29 NOTE — Telephone Encounter (Signed)
Left another msg asking pt to confirm this AWV-S w/ nurse on same day as scheduled labs. Last AWV 01/23/16. VDM (DD)

## 2017-12-06 NOTE — Telephone Encounter (Signed)
Voicemail to mobile number hasn't been set up yet. Left another msg on home number asking pt to call me at [phone] to confirm AWV-S on same day as labs. VDM (DD)

## 2017-12-20 ENCOUNTER — Ambulatory Visit (INDEPENDENT_AMBULATORY_CARE_PROVIDER_SITE_OTHER): Payer: Medicare Other

## 2017-12-20 ENCOUNTER — Other Ambulatory Visit: Payer: Medicare Other

## 2017-12-20 ENCOUNTER — Other Ambulatory Visit: Payer: Self-pay | Admitting: Internal Medicine

## 2017-12-20 ENCOUNTER — Other Ambulatory Visit: Payer: Self-pay

## 2017-12-20 VITALS — BP 138/70 | HR 78 | Temp 97.5°F | Ht 62.0 in | Wt 160.0 lb

## 2017-12-20 DIAGNOSIS — Z0000 Encounter for general adult medical examination without abnormal findings: Secondary | ICD-10-CM

## 2017-12-20 DIAGNOSIS — Z944 Liver transplant status: Secondary | ICD-10-CM

## 2017-12-20 DIAGNOSIS — Z79899 Other long term (current) drug therapy: Secondary | ICD-10-CM

## 2017-12-20 DIAGNOSIS — F338 Other recurrent depressive disorders: Secondary | ICD-10-CM | POA: Diagnosis not present

## 2017-12-20 DIAGNOSIS — Z8619 Personal history of other infectious and parasitic diseases: Secondary | ICD-10-CM

## 2017-12-20 DIAGNOSIS — I10 Essential (primary) hypertension: Secondary | ICD-10-CM

## 2017-12-20 MED ORDER — TETANUS-DIPHTH-ACELL PERTUSSIS 5-2.5-18.5 LF-MCG/0.5 IM SUSP: 0.5000 mL | Freq: Once | INTRAMUSCULAR | 0 refills | Status: AC

## 2017-12-20 MED ORDER — ZOSTER VAC RECOMB ADJUVANTED 50 MCG/0.5ML IM SUSR: 0.5000 mL | Freq: Once | INTRAMUSCULAR | 1 refills | Status: AC

## 2017-12-20 NOTE — Patient Instructions (Addendum)
Candace Hill , Thank you for taking time to come for your Medicare Wellness Visit. I appreciate your ongoing commitment to your health goals. Please review the following plan we discussed and let me know if I can assist you in the future.   Screening recommendations/referrals: Colonoscopy excluded, over age 70 Mammogram up to date, due 10/21/2018 Bone Density up to date Recommended yearly ophthalmology/optometry visit for glaucoma screening and checkup Recommended yearly dental visit for hygiene and checkup  Vaccinations: Influenza vaccine up to date, due 2019 fall season Pneumococcal vaccine up to date, completed Tdap vaccine due, ordered to pharmacy Shingles vaccine ordered to pharmacy    Advanced directives: Please bring [place] a copy of your new living will and health care power of attorney  Conditions/risks identified: none  Next appointment: Dr. Eulas Post 12/31/2017 @ 12:30pm             Tyson Dense, RN 12/23/2018 @   Preventive Care 12 Years and Older, Female Preventive care refers to lifestyle choices and visits with your health care provider that can promote health and wellness. What does preventive care include?  A yearly physical exam. This is also called an annual well check.  Dental exams once or twice a year.  Routine eye exams. Ask your health care provider how often you should have your eyes checked.  Personal lifestyle choices, including:  Daily care of your teeth and gums.  Regular physical activity.  Eating a healthy diet.  Avoiding tobacco and drug use.  Limiting alcohol use.  Practicing safe sex.  Taking low-dose aspirin every day.  Taking vitamin and mineral supplements as recommended by your health care provider. What happens during an annual well check? The services and screenings done by your health care provider during your annual well check will depend on your age, overall health, lifestyle risk factors, and family history of disease. Counseling    Your health care provider may ask you questions about your:  Alcohol use.  Tobacco use.  Drug use.  Emotional well-being.  Home and relationship well-being.  Sexual activity.  Eating habits.  History of falls.  Memory and ability to understand (cognition).  Work and work Statistician.  Reproductive health. Screening  You may have the following tests or measurements:  Height, weight, and BMI.  Blood pressure.  Lipid and cholesterol levels. These may be checked every 5 years, or more frequently if you are over 67 years old.  Skin check.  Lung cancer screening. You may have this screening every year starting at age 105 if you have a 30-pack-year history of smoking and currently smoke or have quit within the past 15 years.  Fecal occult blood test (FOBT) of the stool. You may have this test every year starting at age 2.  Flexible sigmoidoscopy or colonoscopy. You may have a sigmoidoscopy every 5 years or a colonoscopy every 10 years starting at age 24.  Hepatitis C blood test.  Hepatitis B blood test.  Sexually transmitted disease (STD) testing.  Diabetes screening. This is done by checking your blood sugar (glucose) after you have not eaten for a while (fasting). You may have this done every 1-3 years.  Bone density scan. This is done to screen for osteoporosis. You may have this done starting at age 5.  Mammogram. This may be done every 1-2 years. Talk to your health care provider about how often you should have regular mammograms. Talk with your health care provider about your test results, treatment options, and if necessary,  the need for more tests. Vaccines  Your health care provider may recommend certain vaccines, such as:  Influenza vaccine. This is recommended every year.  Tetanus, diphtheria, and acellular pertussis (Tdap, Td) vaccine. You may need a Td booster every 10 years.  Zoster vaccine. You may need this after age 55.  Pneumococcal 13-valent  conjugate (PCV13) vaccine. One dose is recommended after age 69.  Pneumococcal polysaccharide (PPSV23) vaccine. One dose is recommended after age 49. Talk to your health care provider about which screenings and vaccines you need and how often you need them. This information is not intended to replace advice given to you by your health care provider. Make sure you discuss any questions you have with your health care provider. Document Released: 06/28/2015 Document Revised: 02/19/2016 Document Reviewed: 04/02/2015 Elsevier Interactive Patient Education  2017 [place] Prevention in the Home Falls can cause injuries. They can happen to people of all ages. There are many things you can do to make your home safe and to help prevent falls. What can I do on the outside of my home?  Regularly fix the edges of walkways and driveways and fix any cracks.  Remove anything that might make you trip as you walk through a door, such as a raised step or threshold.  Trim any bushes or trees on the path to your home.  Use bright outdoor lighting.  Clear any walking paths of anything that might make someone trip, such as rocks or tools.  Regularly check to see if handrails are loose or broken. Make sure that both sides of any steps have handrails.  Any raised decks and porches should have guardrails on the edges.  Have any leaves, snow, or ice cleared regularly.  Use sand or salt on walking paths during winter.  Clean up any spills in your garage right away. This includes oil or grease spills. What can I do in the bathroom?  Use night lights.  Install grab bars by the toilet and in the tub and shower. Do not use towel bars as grab bars.  Use non-skid mats or decals in the tub or shower.  If you need to sit down in the shower, use a plastic, non-slip stool.  Keep the floor dry. Clean up any water that spills on the floor as soon as it happens.  Remove soap buildup in the tub or  shower regularly.  Attach bath mats securely with double-sided non-slip rug tape.  Do not have throw rugs and other things on the floor that can make you trip. What can I do in the bedroom?  Use night lights.  Make sure that you have a light by your bed that is easy to reach.  Do not use any sheets or blankets that are too big for your bed. They should not hang down onto the floor.  Have a firm chair that has side arms. You can use this for support while you get dressed.  Do not have throw rugs and other things on the floor that can make you trip. What can I do in the kitchen?  Clean up any spills right away.  Avoid walking on wet floors.  Keep items that you use a lot in easy-to-reach places.  If you need to reach something above you, use a strong step stool that has a grab bar.  Keep electrical cords out of the way.  Do not use floor polish or wax that makes floors slippery. If you must use  wax, use non-skid floor wax.  Do not have throw rugs and other things on the floor that can make you trip. What can I do with my stairs?  Do not leave any items on the stairs.  Make sure that there are handrails on both sides of the stairs and use them. Fix handrails that are broken or loose. Make sure that handrails are as long as the stairways.  Check any carpeting to make sure that it is firmly attached to the stairs. Fix any carpet that is loose or worn.  Avoid having throw rugs at the top or bottom of the stairs. If you do have throw rugs, attach them to the floor with carpet tape.  Make sure that you have a light switch at the top of the stairs and the bottom of the stairs. If you do not have them, ask someone to add them for you. What else can I do to help prevent falls?  Wear shoes that:  Do not have high heels.  Have rubber bottoms.  Are comfortable and fit you well.  Are closed at the toe. Do not wear sandals.  If you use a stepladder:  Make sure that it is fully  opened. Do not climb a closed stepladder.  Make sure that both sides of the stepladder are locked into place.  Ask someone to hold it for you, if possible.  Clearly mark and make sure that you can see:  Any grab bars or handrails.  First and last steps.  Where the edge of each step is.  Use tools that help you move around (mobility aids) if they are needed. These include:  Canes.  Walkers.  Scooters.  Crutches.  Turn on the lights when you go into a dark area. Replace any light bulbs as soon as they burn out.  Set up your furniture so you have a clear path. Avoid moving your furniture around.  If any of your floors are uneven, fix them.  If there are any pets around you, be aware of where they are.  Review your medicines with your doctor. Some medicines can make you feel dizzy. This can increase your chance of falling. Ask your doctor what other things that you can do to help prevent falls. This information is not intended to replace advice given to you by your health care provider. Make sure you discuss any questions you have with your health care provider. Document Released: 03/28/2009 Document Revised: 11/07/2015 Document Reviewed: 07/06/2014 Elsevier Interactive Patient Education  2017 [employer].

## 2017-12-20 NOTE — Addendum Note (Signed)
Addended by: Tyson Dense E on: 12/20/2017 01:25 PM   Modules accepted: Orders

## 2017-12-20 NOTE — Progress Notes (Signed)
Subjective:   Candace Hill is a 72 y.o. female who presents for Medicare Annual (Subsequent) preventive examination.  Last AWV-05/27/2016    Objective:     Vitals: BP 138/70 (BP Location: Left Arm, Patient Position: Sitting)   Pulse 78   Temp (!) 97.5 F (36.4 C) (Oral)   Ht 5\' 2"  (1.575 m)   Wt 160 lb (72.6 kg)   SpO2 98%   BMI 29.26 kg/m   Body mass index is 29.26 kg/m.  Advanced Directives 12/20/2017 08/31/2016 08/31/2016 08/15/2016 08/14/2016 08/06/2016 08/04/2016  Does Patient Have a Medical Advance Directive? No No No No No No No  Type of Advance Directive - - - - - - -  Does patient want to make changes to medical advance directive? - No - Patient declined - No - Patient declined - Yes (Inpatient - patient requests chaplain consult to change a medical advance directive) No - Patient declined  Copy of [place] in [place]  Would patient like information on creating a medical advance directive? Yes (MAU/Ambulatory/Procedural Areas - Information given) No - Patient declined - No - Patient declined - - -  Pre-existing out of facility DNR order (yellow form or pink MOST form) - - - - - - -    Tobacco Social History   Tobacco Use  Smoking Status Never Smoker  Smokeless Tobacco Never Used     Counseling given: Not Answered   Clinical Intake:  Pre-visit preparation completed: No  Pain : No/denies pain     Nutritional Risks: None Diabetes: No  How often do you need to have someone help you when you read instructions, pamphlets, or other written materials from your doctor or pharmacy?: 1 - Never What is the last grade level you completed in school?: 2 years of college  Interpreter Needed?: No  Information entered by :: Tyson Dense, RN  Past Medical History:  Diagnosis Date  . Abnormal uterine bleeding   . Acid reflux   . Asthma    [phone] due to black mold house, no asthma now  . Broken ankle    right  . Broken rib    x2    . Cancer ([place])    liver (in the one that was removed)  . Central retinal vein occlusion few yrs   left eye retinal occlusion  . Complication of anesthesia    IV phenergan  serious mental reaction  . Depression   . Hepatitis C    HEP C  . Hernia, femoral    left  . History of blood transfusion 1971   acquired hepatitis c  . History of kidney stones   . History of renal stone   . History of shingles   . Hypertension    due to profraf  . Kidney stones    x 4 per new patient packet   . Renal stone 11/2012  . Seasonal affective disorder ([place])   . Stroke [facility])    TIA x2 caused by prednisone  . Tailbone injury age 60 and age 57 and few weeks ago   broken x3  . TIA (transient ischemic attack)   . 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 CATARACT EXTRACTION     bilateral  . CHOLECYSTECTOMY  1997  . CYSTOSCOPY W/ URETERAL STENT PLACEMENT Left 08/14/2016   Procedure:  CYSTOSCOPY WITH RETROGRADE PYELOGRAM/URETERAL LEFT URETEROSCOPY AND LEFT STENT PLACEMENT;  Surgeon: Raynelle Bring, MD;  Location: [facility];  Service: Urology;  Laterality: Left;  . CYSTOSCOPY/URETEROSCOPY/HOLMIUM LASER/STENT PLACEMENT Left 07/06/2016   Procedure: CYSTOSCOPY/URETEROSCOPY/ BILATERAL RETROGRADE/HOLMIUM LASER/STENT PLACEMENT/BASKET STONE REMOVAL;  Surgeon: Raynelle Bring, MD;  Location: [facility];  Service: Urology;  Laterality: Left;  . CYSTOSCOPY/URETEROSCOPY/HOLMIUM LASER/STENT PLACEMENT Left 08/06/2016   Procedure: CYSTOSCOPY/URETEROSCOPY/HOLMIUM LASER/STENT PLACEMENT;  Surgeon: Raynelle Bring, MD;  Location: [facility];  Service: Urology;  Laterality: Left;  . CYSTOSCOPY/URETEROSCOPY/HOLMIUM LASER/STENT PLACEMENT Left 08/31/2016   Procedure: CYSTOSCOPY/URETEROSCOPY/HOLMIUM LASER/STENT PLACEMENT/BASKET STONE REMOVAL;  Surgeon: Raynelle Bring, MD;  Location: [facility];  Service: Urology;  Laterality: Left;  .  FEMORAL HERNIA REPAIR  1997   left leg  . NASAL SINUS SURGERY  1997   x3  . TONSILLECTOMY  age 70  . 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 TUBAL LIGATION     Family History  Adopted: Yes  Problem Relation Age of Onset  . Stroke Mother   . Alzheimer's disease Mother   . Cancer Mother   . Diabetes Mother        Questionable, per new patient packet   . Arthritis Mother        Questionable, per new patient packet   . Alcoholism Father   . COPD Brother        Heavy smoker, per new patient packet   . Suicidality Sister   . Depression Sister   . COPD Sister   . OCD Daughter   . Personality disorder Daughter   . Alcoholism Son    Social History   Socioeconomic History  . Marital status: Divorced    Spouse name: Not on file  . Number of children: 2  . Years of education: 41  . Highest education level: Not on file  Occupational History  . Occupation: Disabled  Social Needs  . Financial resource strain: Not hard at all  . Food insecurity:    Worry: Never true    Inability: Never true  . Transportation needs:    Medical: No    Non-medical: No  Tobacco Use  . Smoking status: Never Smoker  . Smokeless tobacco: Never Used  Substance and Sexual Activity  . Alcohol use: No  . Drug use: No  . Sexual activity: Never    Comment: 14 years ago  Lifestyle  . Physical activity:    Days per week: 5 days    Minutes per session: 20 min  . Stress: To some extent  Relationships  . Social connections:    Talks on phone: Never    Gets together: Never    Attends religious service: Never    Active member of club or organization: No    Attends meetings of clubs or organizations: Never    Relationship status: Divorced  Other Topics Concern  . Not on file  Social History Narrative   Lives at alone alone.   Right-handed.   No caffeine use.      PSC- as of 10/12/17   Diet: No Fried Foods       Caffeine: Summer time, sweet tea       Married, if yes what year: Divorced       Do you  live in a house, apartment, assisted living, condo, trailer, ect: [place], 1 stories, and 1 person       Pets: 1 dog       Current/Past profession: Retail buyer  Exercise: Some, walking dog       Living Will: yes   DNR: yes   POA/HPOA: yes      Functional Status: Patient did not answer   Do you have difficulty bathing or dressing yourself?   Do you have difficulty preparing food or eating?   Do you have difficulty managing your medications?   Do you have difficulty managing your finances?   Do you have difficulty affording your medications?    Outpatient Encounter Medications as of 12/20/2017  Medication Sig  . acetaminophen (TYLENOL) 500 MG tablet Take 500-1,000 mg by mouth every 6 (six) hours as needed for mild pain, moderate pain or headache.   . DULoxetine (CYMBALTA) 30 MG capsule Take 90 mg by mouth daily.  . ergocalciferol (VITAMIN D2) 50000 units capsule Take 50,000 Units by mouth every 14 (fourteen) days.   . Ferrous Sulfate (IRON) 325 (65 Fe) MG TABS Take 1 tablet by mouth daily.  . folic acid (FOLVITE) 161 MCG tablet Take 400 mcg by mouth daily.  . hydrOXYzine (ATARAX/VISTARIL) 25 MG tablet Take 25 mg by mouth every 6 (six) hours as needed for itching.  . potassium citrate (UROCIT-K) 10 MEQ (1080 MG) SR tablet Take 10 mEq by mouth 2 (two) times daily. Takes at 2pm and 200am  . pyridoxine (B-6) 100 MG tablet Take 100 mg by mouth daily.  . tacrolimus (PROGRAF) 0.5 MG capsule Take 1.5 mg by mouth 2 (two) times daily. Takes at 2pm and 2am  . DULoxetine (CYMBALTA) 60 MG capsule Take 60 mg by mouth daily. Takes at 2ooam  . propranolol ER (INDERAL LA) 80 MG 24 hr capsule Take 1 capsule (80 mg total) by mouth daily. Please call [phone] to schedule an appt with Dr. Krista Blue.  . rizatriptan (MAXALT-MLT) 5 MG disintegrating tablet Take 1 tablet (5 mg total) by mouth as needed. May repeat in 2 hours if needed (Patient taking differently: Take 5 mg by mouth as needed for  migraine. May repeat in 2 hours if needed)   No facility-administered encounter medications on file as of 12/20/2017.     Activities of Daily Living In your present state of health, do you have any difficulty performing the following activities: 12/20/2017  Hearing? N  Vision? Y  Difficulty concentrating or making decisions? Y  Walking or climbing stairs? N  Dressing or bathing? N  Doing errands, shopping? N  Preparing Food and eating ? N  Using the Toilet? N  In the past six months, have you accidently leaked urine? N  Do you have problems with loss of bowel control? N  Comment not now, but had some problems in the last 6 month  Managing your Medications? N  Managing your Finances? N  Housekeeping or managing your Housekeeping? N  Some recent data might be hidden    Patient Care Team: Gildardo Cranker, DO as PCP - General (Internal Medicine) Rolm Bookbinder, MD as Consulting Physician (Dermatology) Gerarda Fraction, MD as Referring Physician (Ophthalmology) Raynelle Bring, MD as Consulting Physician (Urology)    Assessment:   This is a routine wellness examination for Denim.  Exercise Activities and Dietary recommendations Current Exercise Habits: Home exercise routine, Type of exercise: walking, Time (Minutes): 15, Frequency (Times/Week): 5, Weekly Exercise (Minutes/Week): 75, Intensity: Mild, Exercise limited by: None identified  Goals    None      Fall Risk Fall Risk  12/20/2017 10/12/2017 10/12/2017  Falls in the past year? Yes Yes No  Number falls in past  yr: 2 or more 1 -  Injury with Fall? No No -   Is the patient's home free of loose throw rugs in walkways, pet beds, electrical cords, etc?   yes      Grab bars in the bathroom? no      Handrails on the stairs?   yes      Adequate lighting?   yes  Timed Get Up and Go performed: 14 seconds  Depression Screen PHQ 2/9 Scores 12/20/2017  PHQ - 2 Score 2  PHQ- 9 Score 8     Cognitive Function MMSE - Mini Mental State  Exam 12/20/2017  Orientation to time 4  Orientation to Place 4  Registration 3  Attention/ Calculation 5  Recall 3  Language- name 2 objects 2  Language- repeat 1  Language- follow 3 step command 3  Language- read & follow direction 1  Write a sentence 1  Copy design 1  Total score 28         There is no immunization history on file for this patient.  Qualifies for Shingles Vaccine? Yes, educated and ordered to pharmacy  Screening Tests Health Maintenance  Topic Date Due  . Samul Dada  10[birth year]/1966  . PNA vac Low Risk Adult (1 of 2 - PCV13) 10/16/2012  . MAMMOGRAM  08/10/2016  . INFLUENZA VACCINE  01/13/2018  . COLONOSCOPY  01/26/2023  . DEXA SCAN  Completed  . Hepatitis C Screening  Completed    Cancer Screenings: Lung: Low Dose CT Chest recommended if Age 68-80 years, 30 pack-year currently smoking OR have quit w/in 15years. Patient does not qualify. Breast:  Up to date on Mammogram? Yes   Up to date of Bone Density/Dexa? Yes Colorectal: up to date  Additional Screenings:  Hepatitis C Screening: declined TDAP due: ordered    Plan:    I have personally reviewed and addressed the Medicare Annual Wellness questionnaire and have noted the following in the patient's chart:  A. Medical and social history B. Use of alcohol, tobacco or illicit drugs  C. Current medications and supplements D. Functional ability and status E.  Nutritional status F.  Physical activity G. Advance directives H. List of other physicians I.  Hospitalizations, surgeries, and ER visits in previous 12 months J.  [place] to include hearing, vision, cognitive, depression L. Referrals and appointments - none  In addition, I have reviewed and discussed with patient certain preventive protocols, quality metrics, and best practice recommendations. A written personalized care plan for preventive services as well as general preventive health recommendations were provided to  patient.  See attached scanned questionnaire for additional information.   Signed,   Tyson Dense, RN Nurse Health Advisor  Patient Concerns: None

## 2017-12-21 LAB — LIPID PANEL
CHOL/HDL RATIO: 3.8 (calc) (ref <5.0)
CHOLESTEROL: 197 mg/dL (ref <200)
HDL: 52 mg/dL (ref >50)
LDL CHOLESTEROL (CALC): 126 mg/dL — AB
Non-HDL Cholesterol (Calc): 145 mg/dL (calc) — ABNORMAL HIGH (ref <130)
TRIGLYCERIDES: 87 mg/dL (ref <150)

## 2017-12-21 LAB — URINALYSIS, ROUTINE W REFLEX MICROSCOPIC
Bacteria, UA: NONE SEEN /HPF
Bilirubin Urine: NEGATIVE
Glucose, UA: NEGATIVE
Ketones, ur: NEGATIVE
Nitrite: NEGATIVE
SPECIFIC GRAVITY, URINE: 1.022 (ref 1.001–1.03)

## 2017-12-21 LAB — COMPLETE METABOLIC PANEL WITH GFR
AG Ratio: 1.9 (calc) (ref 1.0–2.5)
ALBUMIN MSPROF: 4.5 g/dL (ref 3.6–5.1)
ALKALINE PHOSPHATASE (APISO): 60 U/L (ref 33–130)
ALT: 53 U/L — ABNORMAL HIGH (ref 6–29)
AST: 37 U/L — AB (ref 10–35)
BUN / CREAT RATIO: 13 (calc) (ref 6–22)
BUN: 14 mg/dL (ref 7–25)
CO2: 29 mmol/L (ref 20–32)
CREATININE: 1.1 mg/dL — AB (ref 0.60–0.93)
Calcium: 9.6 mg/dL (ref 8.6–10.4)
Chloride: 103 mmol/L (ref 98–110)
GFR, Est African American: 59 mL/min/{1.73_m2} — ABNORMAL LOW (ref >=60)
GFR, Est Non African American: 51 mL/min/{1.73_m2} — ABNORMAL LOW (ref >=60)
GLOBULIN: 2.4 g/dL (ref 1.9–3.7)
GLUCOSE: 118 mg/dL — AB (ref 65–99)
Potassium: 4 mmol/L (ref 3.5–5.3)
SODIUM: 143 mmol/L (ref 135–146)
Total Bilirubin: 0.5 mg/dL (ref 0.2–1.2)
Total Protein: 6.9 g/dL (ref 6.1–8.1)

## 2017-12-21 LAB — CBC WITH DIFFERENTIAL/PLATELET
BASOS PCT: 0.8 %
Basophils Absolute: 62 cells/uL (ref 0–200)
EOS PCT: 3.4 %
Eosinophils Absolute: 262 cells/uL (ref 15–500)
HCT: 42.5 % (ref 35.0–45.0)
Hemoglobin: 14.7 g/dL (ref 11.7–15.5)
Lymphs Abs: 1702 cells/uL (ref 850–3900)
MCH: 30.6 pg (ref 27.0–33.0)
MCHC: 34.6 g/dL (ref 32.0–36.0)
MCV: 88.4 fL (ref 80.0–100.0)
MONOS PCT: 6.8 %
MPV: 10.4 fL (ref 7.5–12.5)
NEUTROS PCT: 66.9 %
Neutro Abs: 5151 cells/uL (ref 1500–7800)
PLATELETS: 208 10*3/uL (ref 140–400)
RBC: 4.81 10*6/uL (ref 3.80–5.10)
RDW: 13.5 % (ref 11.0–15.0)
TOTAL LYMPHOCYTE: 22.1 %
WBC: 7.7 10*3/uL (ref 3.8–10.8)
WBCMIX: 524 {cells}/uL (ref 200–950)

## 2017-12-21 LAB — TSH: TSH: 3.86 m[IU]/L (ref 0.40–4.50)

## 2017-12-23 DIAGNOSIS — H34812 Central retinal vein occlusion, left eye, with macular edema: Secondary | ICD-10-CM | POA: Diagnosis not present

## 2017-12-23 DIAGNOSIS — H53002 Unspecified amblyopia, left eye: Secondary | ICD-10-CM | POA: Diagnosis not present

## 2017-12-23 DIAGNOSIS — H26491 Other secondary cataract, right eye: Secondary | ICD-10-CM | POA: Diagnosis not present

## 2017-12-23 DIAGNOSIS — Z961 Presence of intraocular lens: Secondary | ICD-10-CM | POA: Diagnosis not present

## 2017-12-31 ENCOUNTER — Ambulatory Visit (INDEPENDENT_AMBULATORY_CARE_PROVIDER_SITE_OTHER): Payer: Medicare Other | Admitting: Internal Medicine

## 2017-12-31 ENCOUNTER — Encounter: Payer: Self-pay | Admitting: Internal Medicine

## 2017-12-31 VITALS — BP 150/100 | HR 88 | Temp 98.3°F | Ht 62.0 in | Wt 161.4 lb

## 2017-12-31 DIAGNOSIS — F338 Other recurrent depressive disorders: Secondary | ICD-10-CM

## 2017-12-31 DIAGNOSIS — Z87442 Personal history of urinary calculi: Secondary | ICD-10-CM | POA: Diagnosis not present

## 2017-12-31 DIAGNOSIS — Z944 Liver transplant status: Secondary | ICD-10-CM

## 2017-12-31 DIAGNOSIS — R739 Hyperglycemia, unspecified: Secondary | ICD-10-CM | POA: Diagnosis not present

## 2017-12-31 DIAGNOSIS — I10 Essential (primary) hypertension: Secondary | ICD-10-CM

## 2017-12-31 NOTE — Patient Instructions (Addendum)
Will call with lab result  Continue current medications as ordered  Check with liver transplant team to see if you need to continue getting pneumovax vaccine every 5 yrs due to transplant history  Follow up with specialists as scheduled  Follow up in 3 mos for seasonal affective d/o, HTN  Keeping You Healthy  Get These Tests  Blood Pressure- Have your blood pressure checked by your healthcare provider at least once a year.  Normal blood pressure is 120/80.  Weight- Have your body mass index (BMI) calculated to screen for obesity.  BMI is a measure of body fat based on height and weight.  You can calculate your own BMI at [employer]  Cholesterol- Have your cholesterol checked every year.  Diabetes- Have your blood sugar checked every year if you have high blood pressure, high cholesterol, a family history of diabetes or if you are overweight.  Pap Test - Have a pap test every 1 to 5 years if you have been sexually active.  If you are older than 65 and recent pap tests have been normal you may not need additional pap tests.  In addition, if you have had a hysterectomy  for benign disease additional pap tests are not necessary.  Mammogram-Yearly mammograms are essential for early detection of breast cancer  Screening for Colon Cancer- Colonoscopy starting at age 76. Screening may begin sooner depending on your family history and other health conditions.  Follow up colonoscopy as directed by your Gastroenterologist.  Screening for Osteoporosis- Screening begins at age 45 with bone density scanning, sooner if you are at higher risk for developing Osteoporosis.  Get these medicines  Calcium with Vitamin D- Your body requires 1200-1500 mg of Calcium a day and [id number] IU of Vitamin D a day.  You can only absorb 500 mg of Calcium at a time therefore Calcium must be taken in 2 or 3 separate doses throughout the day.  Hormones- Hormone therapy has been associated with increased  risk for certain cancers and heart disease.  Talk to your healthcare provider about if you need relief from menopausal symptoms.  Aspirin- Ask your healthcare provider about taking Aspirin to prevent Heart Disease and Stroke.  Get these Immuniztions  Flu shot- Every fall  Pneumonia shot- Once after the age of 10; if you are younger ask your healthcare provider if you need a pneumonia shot.  Tetanus- Every ten years.  Zostavax- Once after the age of 58 to prevent shingles.  Take these steps  Don't smoke- Your healthcare provider can help you quit. For tips on how to quit, ask your healthcare provider or go to www.smokefree.gov or call 1-800 QUIT-NOW.  Be physically active- Exercise 5 days a week for a minimum of 30 minutes.  If you are not already physically active, start slow and gradually work up to 30 minutes of moderate physical activity.  Try walking, dancing, bike riding, swimming, etc.  Eat a healthy diet- Eat a variety of healthy foods such as fruits, vegetables, whole grains, low fat milk, low fat cheeses, yogurt, lean meats, chicken, fish, eggs, dried beans, tofu, etc.  For more information go to www.thenutritionsource.org  Dental visit- Brush and floss teeth twice daily; visit your dentist twice a year.  Eye exam- Visit your Optometrist or Ophthalmologist yearly.  Drink alcohol in moderation- Limit alcohol intake to one drink or less a day.  Never drink and drive.  Depression- Your emotional health is as important as your physical health.  If you're feeling  down or losing interest in things you normally enjoy, please talk to your healthcare provider.  Seat Belts- can save your life; always wear one  Smoke/Carbon Monoxide detectors- These detectors need to be installed on the appropriate level of your home.  Replace batteries at least once a year.  Violence- If anyone is threatening or hurting you, please tell your healthcare provider.  Living Will/ Health care power of  attorney- Discuss with your healthcare provider and family.

## 2017-12-31 NOTE — Progress Notes (Signed)
Patient ID: Candace Hill, female   DOB: 03/11/1946, 72 y.o.   MRN: [id number]   Location:  [facility]  Place of Service:  OFFICE  Provider: Arletha Grippe, DO  Patient Care Team: Gildardo Cranker, DO as PCP - General (Internal Medicine) Rolm Bookbinder, MD as Consulting Physician (Dermatology) Gerarda Fraction, MD as Referring Physician (Ophthalmology) Raynelle Bring, MD as Consulting Physician (Urology)  Extended Emergency Contact Information Primary Emergency Contact: Candace Hill [facility] Phone: [phone] Work Phone: [phone] Mobile Phone: [phone] Relation: Daughter  Code Status:  Goals of Care: Advanced Directive information Advanced Directives 12/20/2017  Does Patient Have a Medical Advance Directive? No  Type of Advance Directive -  Does patient want to make changes to medical advance directive? -  Copy of Espino in Chart? -  Would patient like information on creating a medical advance directive? Yes (MAU/Ambulatory/Procedural Areas - Information given)  Pre-existing out of facility DNR order (yellow form or pink MOST form) -     Chief Complaint  Patient presents with  . Medical Management of Chronic Issues    Extended Visit, AWV completed on 12/20/17, - fall, + depression, MMSE 28/30  . Health Maintenance    Follow-up on all HM recommendations from AWV   . Medication Refill    HPI: Patient is a 72 y.o. female seen in today for an extended visit.  AWV completed 12/20/17. MMSE 28/30. Pneumovax to be determined by liver tx team. She is due for repeat if she continues to need one after age 67 due to tx hx  Hx hepatic cirrhosis/chronic Hep C hx - s/p liver tx 02/17/2008. Followed by [facility] transplant team Dr Lemmie Evens. Currently takes prograf.  She was tx and cured Hepatitis C by [facility]. LFTs remain slightly elevated (AST 58; ALT 83). Hep C contracted following blood transfusion after SVD in 1950s but was not dx until 1997.  Glaucoma/CRVO OS - tx with eye  gtts. 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 She is followed by [facility] Dr Cordelia Pen (ophthamology)  HTN - takes metoprolol and propanolol. She also takes ASA daily  CKD - stage 2. Cr 1.16  Hx nephrolithiasis - followed by [facility] Dr Alinda Money. She has had stents and lithotripsy in the past. Recent UA revealed microscopic RBCs. She is due to see urology soon.  Hx Vit D deficiency - takes weekly Vit D 50K units  GERD - stable on omeprazole  Seasonal affective d/o - followed by psychologist. She takes cymbalta. effexor xr ineffective   Hx anemia - stable on iron and folate  TIA (dx in 2016)/hx HA/excessive daytime sleepiness - followed by neurology Dr Rexene Alberts. She was to go for sleep study to r/o OSA but could not complete study. She has taken maxalt prn HA  She is adopted but is aware of birthmother's med hx including Alzheimer's, stroke, DM and HTN      Depression screen [facility] 2/9 12/20/2017  Decreased Interest 0  Down, Depressed, Hopeless 2  PHQ - 2 Score 2  Altered sleeping 3  Tired, decreased energy 0  Change in appetite 1  Feeling bad or failure about yourself  0  Trouble concentrating 2  Moving slowly or fidgety/restless 0  Suicidal thoughts 0  PHQ-9 Score 8  Difficult doing work/chores Somewhat difficult    Fall Risk  12/31/2017 12/20/2017 10/12/2017 10/12/2017  Falls in the past year? Yes Yes Yes No  Number falls in past yr: 2 or more 2 or more 1 -  Injury  with Fall? No No No -   MMSE - Mini Mental State Exam 12/20/2017  Orientation to time 4  Orientation to Place 4  Registration 3  Attention/ Calculation 5  Recall 3  Language- name 2 objects 2  Language- repeat 1  Language- follow 3 step command 3  Language- read & follow direction 1  Write a sentence 1  Copy design 1  Total score 28     Health Maintenance  Topic Date Due  . TETANUS/TDAP  10/17/1966  . PNA vac Low Risk Adult (1 of 2 - PCV13) 10/16/2012  . MAMMOGRAM  08/10/2016  . INFLUENZA VACCINE  01/13/2018  .  COLONOSCOPY  01/26/2023  . DEXA SCAN  Completed  . Hepatitis C Screening  Completed    Urinary incontinence?  Functional Status Survey:    Exercise?  Diet?  No exam data present  Hearing:    Dentition:  Pain:  Past Medical History:  Diagnosis Date  . Abnormal uterine bleeding   . Acid reflux   . Asthma    [phone] due to black mold house, no asthma now  . Broken ankle    right  . Broken rib    x2  . Cancer ([place])    liver (in the one that was removed)  . Central retinal vein occlusion few yrs   left eye retinal occlusion  . Complication of anesthesia    IV phenergan  serious mental reaction  . Depression   . Hepatitis C    HEP C  . Hernia, femoral    left  . History of blood transfusion 1971   acquired hepatitis c  . History of kidney stones   . History of renal stone   . History of shingles   . Hypertension    due to profraf  . Kidney stones    x 4 per new patient packet   . Renal stone 11/2012  . Seasonal affective disorder ([place])   . Stroke [facility])    TIA x2 caused by prednisone  . Tailbone injury age 26 and age 83 and few weeks ago   broken x3  . TIA (transient ischemic attack)   . 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 CATARACT EXTRACTION     bilateral  . CHOLECYSTECTOMY  1997  . CYSTOSCOPY W/ URETERAL STENT PLACEMENT Left 08/14/2016   Procedure: CYSTOSCOPY WITH RETROGRADE PYELOGRAM/URETERAL LEFT URETEROSCOPY AND LEFT STENT PLACEMENT;  Surgeon: Raynelle Bring, MD;  Location: [facility];  Service: Urology;  Laterality: Left;  . CYSTOSCOPY/URETEROSCOPY/HOLMIUM LASER/STENT PLACEMENT Left 07/06/2016   Procedure: CYSTOSCOPY/URETEROSCOPY/ BILATERAL RETROGRADE/HOLMIUM LASER/STENT PLACEMENT/BASKET STONE REMOVAL;  Surgeon: Raynelle Bring, MD;  Location: [facility];  Service: Urology;  Laterality: Left;  . CYSTOSCOPY/URETEROSCOPY/HOLMIUM LASER/STENT  PLACEMENT Left 08/06/2016   Procedure: CYSTOSCOPY/URETEROSCOPY/HOLMIUM LASER/STENT PLACEMENT;  Surgeon: Raynelle Bring, MD;  Location: [facility];  Service: Urology;  Laterality: Left;  . CYSTOSCOPY/URETEROSCOPY/HOLMIUM LASER/STENT PLACEMENT Left 08/31/2016   Procedure: CYSTOSCOPY/URETEROSCOPY/HOLMIUM LASER/STENT PLACEMENT/BASKET STONE REMOVAL;  Surgeon: Raynelle Bring, MD;  Location: [facility];  Service: Urology;  Laterality: Left;  . FEMORAL HERNIA REPAIR  1997   left leg  . NASAL SINUS SURGERY  1997   x3  . TONSILLECTOMY  age 28  . 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 TUBAL LIGATION      Family History  Adopted: Yes  Problem Relation Age of Onset  .  Stroke Mother   . Alzheimer's disease Mother   . Cancer Mother   . Diabetes Mother        Questionable, per new patient packet   . Arthritis Mother        Questionable, per new patient packet   . Alcoholism Father   . COPD Brother        Heavy smoker, per new patient packet   . Suicidality Sister   . Depression Sister   . COPD Sister   . OCD Daughter   . Personality disorder Daughter   . Alcoholism Son    Family Status  Relation Name Status  . Mother  Deceased  . Father  Deceased  . Brother [employer]  . Sister [place] Deceased  . Daughter April Alive  . Son Sam Alive    shoulder  Social History   Socioeconomic History  . Marital status: Divorced    Spouse name: Not on file  . Number of children: 2  . Years of education: 56  . Highest education level: Not on file  Occupational History  . Occupation: Disabled  Social Needs  . Financial resource strain: Not hard at all  . Food insecurity:    Worry: Never true    Inability: Never true  . Transportation needs:    Medical: No    Non-medical: No  Tobacco Use  . Smoking status: Never Smoker  . Smokeless tobacco: Never Used  Substance and Sexual Activity  . Alcohol use: No  . Drug use: No  . Sexual activity: Never    Comment: 14 years ago  Lifestyle  . Physical activity:      Days per week: 5 days    Minutes per session: 20 min  . Stress: To some extent  Relationships  . Social connections:    Talks on phone: Never    Gets together: Never    Attends religious service: Never    Active member of club or organization: No    Attends meetings of clubs or organizations: Never    Relationship status: Divorced  . Intimate partner violence:    Fear of current or ex partner: Not on file    Emotionally abused: Not on file    Physically abused: Not on file    Forced sexual activity: Not on file  Other Topics Concern  . Not on file  Social History Narrative   Lives at alone alone.   Right-handed.   No caffeine use.      PSC- as of 10/12/17   Diet: No Fried Foods       Caffeine: Summer time, sweet tea       Married, if yes what year: Divorced       Do you live in a house, apartment, assisted living, condo, trailer, ect: [place], 1 stories, and 1 person       Pets: 1 dog       Current/Past profession: Retail buyer       Exercise: Some, walking dog       Living Will: yes   DNR: yes   POA/HPOA: yes      Functional Status: Patient did not answer   Do you have difficulty bathing or dressing yourself?   Do you have difficulty preparing food or eating?   Do you have difficulty managing your medications?   Do you have difficulty managing your finances?   Do you have difficulty affording your medications?    Allergies  Allergen  Reactions  . Codeine Nausea Only  . 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    Marland Kitchen Penicillins Nausea Only and Other (See Comments)    Has patient had a PCN reaction causing immediate rash, facial/tongue/throat swelling, SOB or lightheadedness with hypotension: No Has patient had a PCN reaction causing severe rash involving mucus membranes or skin necrosis: No Has patient had a PCN reaction that required hospitalization No Has patient had a PCN reaction occurring within the last  10 years: No If all of the above answers are "NO", then may proceed with Cephalosporin use.  Marland Kitchen Phenergan [Promethazine] Other (See Comments)    Reaction:  Hallucinations  Pt states that she is only allergic to IV form.   . Sulfa Antibiotics Itching  . Tamsulosin Itching  . 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     Penicillins Nausea Only, Other (See Comments)   Has patient had a PCN reaction causing immediate rash, facial/tongue/throat swelling, SOB or lightheadedness with hypotension: No Has patient had a PCN reaction causing severe rash involving mucus membranes or skin necrosis: No Has patient had a PCN reaction that required hospitalization No Has patient had a PCN reaction occurring within the last 10 years: No If all of the above answers are "NO", then may proceed with Cephalosporin use.   Phenergan [promethazine] Other (See Comments)   Reaction:  Hallucinations  Pt states that she is only allergic to IV form.    Sulfa Antibiotics Itching   Tamsulosin Itching   Lidocaine Palpitations      Medication List        Accurate as of 12/31/17  1:23 PM. Always use your most recent med list.          acetaminophen 500 MG tablet Commonly known as:  TYLENOL Take 500-1,000 mg by mouth every 6 (six) hours as needed for mild pain, moderate pain or headache.   aspirin EC 81 MG tablet Take 81 mg by mouth daily.   DULoxetine 60 MG capsule Commonly known as:  CYMBALTA Take 60 mg by mouth daily. Takes at 2ooam   DULoxetine 30 MG capsule Commonly known as:  CYMBALTA Take 90 mg by mouth daily.   ergocalciferol 50000 units capsule Commonly known as:  VITAMIN D2 Take 50,000 Units by mouth every 14 (fourteen) days.   folic acid 846 MCG tablet Commonly known as:  FOLVITE Take 400 mcg by mouth daily.   hydrOXYzine 25 MG tablet Commonly known as:   ATARAX/VISTARIL Take 25 mg by mouth every 6 (six) hours as needed for itching.   Iron 325 (65 Fe) MG Tabs Take 1 tablet by mouth daily.   potassium chloride 10 MEQ tablet Commonly known as:  K-DUR,KLOR-CON Take 10 mEq by mouth daily.   PROGRAF 0.5 MG capsule Generic drug:  tacrolimus Take 1.5 mg by mouth 2 (two) times daily. Takes at 2pm and 2am   pyridoxine 100 MG tablet Commonly known as:  B-6 Take 100 mg by mouth daily.        Review of Systems:  Review of Systems  Unable to perform ROS: Other (memory loss; psych d/o)    Physical Exam: Vitals:   12/31/17 1249  BP: (!) 150/100  Pulse: 88  Temp: 98.3 F (36.8 C)  TempSrc: Oral  SpO2: 98%  Weight: 161 lb 6.4 oz (73.2 kg)  Height: 5\' 2"  (1.575 m)   Body mass index is 29.52 kg/m. Physical Exam  Constitutional: She appears well-developed and well-nourished. No distress.  HENT:  Head: Normocephalic and atraumatic.  Right Ear: Hearing, tympanic membrane, external ear and ear canal normal.  Left Ear: Hearing, tympanic membrane, external ear and ear canal normal.  Mouth/Throat: Uvula is midline, oropharynx is clear and moist and mucous membranes are normal. She does not have dentures. No oropharyngeal exudate.  MMM; no oral thrush  Eyes: Pupils are equal, round, and reactive to light. Conjunctivae, EOM and lids are normal. No scleral icterus.  Neck: Trachea normal and normal range of motion. Neck supple. Carotid bruit is not present. No tracheal deviation present. No thyroid mass and no thyromegaly present.  Cardiovascular: Normal rate, regular rhythm and intact distal pulses. Exam reveals no gallop and no friction rub.  Murmur (1/6 SEM) heard. No LE edema b/l. no calf TTP.   Pulmonary/Chest: Effort normal and breath sounds normal. No stridor. No respiratory distress. She has no wheezes. She has no rhonchi. She has no rales. She exhibits no mass. Right breast exhibits no inverted nipple, no mass, no nipple discharge, no  skin change and no tenderness. Left breast exhibits no inverted nipple, no mass, no nipple discharge, no skin change and no tenderness. Breasts are symmetrical.  Abdominal: Soft. Normal appearance, normal aorta and bowel sounds are normal. She exhibits no distension, no pulsatile midline mass and no mass. There is no hepatosplenomegaly or hepatomegaly. There is no tenderness. There is no rigidity, no rebound and no guarding. No hernia.    Genitourinary: Rectal exam shows external hemorrhoid (small, nonthrombosed) and internal hemorrhoid (small; nonthrombosed). Rectal exam shows no fissure, no mass, no tenderness, anal tone normal and guaiac negative stool.  Genitourinary Comments: Chaperone present  Musculoskeletal: Normal range of motion. She exhibits edema (small joints). She exhibits no deformity.  Lymphadenopathy:       Head (right side): No posterior auricular adenopathy present.       Head (left side): No posterior auricular adenopathy present.    She has no cervical adenopathy.       Right: No supraclavicular adenopathy present.       Left: No supraclavicular adenopathy present.  Neurological: She is alert. She has normal strength and normal reflexes. No cranial nerve deficit. Gait normal.  Skin: Skin is warm, dry and intact. No rash noted. Nails show no clubbing.  Psychiatric: She has a normal mood and affect. Her behavior is normal. Thought content normal. Her speech is rapid and/or pressured and tangential. Cognition and memory are normal.    Labs reviewed:  Basic Metabolic Panel: Recent Labs    12/20/17 1147  NA 143  K 4.0  CL 103  CO2 29  GLUCOSE 118*  BUN 14  CREATININE 1.10*  CALCIUM 9.6  TSH 3.86   Liver Function Tests: Recent Labs    12/20/17 1147  AST 37*  ALT 53*  BILITOT 0.5  PROT 6.9   No results for input(s): LIPASE, AMYLASE in the last 8760 hours. No results for input(s): AMMONIA in the last 8760 hours. CBC: Recent Labs    12/20/17 1147  WBC 7.7    NEUTROABS 5,151  HGB 14.7  HCT 42.5  MCV 88.4  PLT 208   Lipid Panel: Recent Labs    12/20/17 1147  CHOL 197  HDL 52  LDLCALC 126*  TRIG 87  CHOLHDL 3.8   No results found for: HGBA1C  Procedures: No results found. ECG OBTAINED AND REVIEWED BY MYSELF: 1st degree AVB @ 72 bpm, nml axis, poor R wave progression. No acute ischemic changes. No change since 06/2016  Assessment/Plan   ICD-10-CM   1. Essential hypertension I10 EKG 12-Lead  2. Seasonal affective disorder ([place]) F33.8   3. Hyperglycemia R73.9 Hemoglobin A1c  4. History of nephrolithiasis Z87.442   5. 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 Perlie Gold  [facility] [address] [place], [place] [address] [phone] Cell (Monday-Friday 8 AM - 5 PM) [phone] After 5 PM and follow prompts

## 2018-01-01 LAB — HEMOGLOBIN A1C
HEMOGLOBIN A1C: 5.3 %{Hb} (ref <5.7)
Mean Plasma Glucose: 105 (calc)
eAG (mmol/L): 5.8 (calc)

## 2018-01-04 DIAGNOSIS — F331 Major depressive disorder, recurrent, moderate: Secondary | ICD-10-CM | POA: Diagnosis not present

## 2018-01-04 DIAGNOSIS — F411 Generalized anxiety disorder: Secondary | ICD-10-CM | POA: Diagnosis not present

## 2018-01-24 DIAGNOSIS — H401131 Primary open-angle glaucoma, bilateral, mild stage: Secondary | ICD-10-CM | POA: Diagnosis not present

## 2018-02-02 ENCOUNTER — Encounter: Payer: Self-pay | Admitting: Internal Medicine

## 2018-03-07 DIAGNOSIS — F411 Generalized anxiety disorder: Secondary | ICD-10-CM | POA: Diagnosis not present

## 2018-03-07 DIAGNOSIS — F331 Major depressive disorder, recurrent, moderate: Secondary | ICD-10-CM | POA: Diagnosis not present

## 2018-03-25 DIAGNOSIS — E612 Magnesium deficiency: Secondary | ICD-10-CM | POA: Diagnosis not present

## 2018-03-25 DIAGNOSIS — Z944 Liver transplant status: Secondary | ICD-10-CM | POA: Diagnosis not present

## 2018-03-25 DIAGNOSIS — Z5181 Encounter for therapeutic drug level monitoring: Secondary | ICD-10-CM | POA: Diagnosis not present

## 2018-04-08 ENCOUNTER — Encounter: Payer: Self-pay | Admitting: Internal Medicine

## 2018-04-08 ENCOUNTER — Ambulatory Visit (INDEPENDENT_AMBULATORY_CARE_PROVIDER_SITE_OTHER): Payer: Medicare Other | Admitting: Internal Medicine

## 2018-04-08 VITALS — BP 128/80 | HR 87 | Temp 98.0°F | Ht 62.0 in | Wt 151.0 lb

## 2018-04-08 DIAGNOSIS — E559 Vitamin D deficiency, unspecified: Secondary | ICD-10-CM | POA: Insufficient documentation

## 2018-04-08 DIAGNOSIS — F338 Other recurrent depressive disorders: Secondary | ICD-10-CM

## 2018-04-08 DIAGNOSIS — Z1239 Encounter for other screening for malignant neoplasm of breast: Secondary | ICD-10-CM | POA: Diagnosis not present

## 2018-04-08 DIAGNOSIS — R634 Abnormal weight loss: Secondary | ICD-10-CM

## 2018-04-08 DIAGNOSIS — Z77090 Contact with and (suspected) exposure to asbestos: Secondary | ICD-10-CM

## 2018-04-08 DIAGNOSIS — Z23 Encounter for immunization: Secondary | ICD-10-CM

## 2018-04-08 DIAGNOSIS — R739 Hyperglycemia, unspecified: Secondary | ICD-10-CM | POA: Diagnosis not present

## 2018-04-08 DIAGNOSIS — Z79899 Other long term (current) drug therapy: Secondary | ICD-10-CM | POA: Insufficient documentation

## 2018-04-08 DIAGNOSIS — I10 Essential (primary) hypertension: Secondary | ICD-10-CM

## 2018-04-08 DIAGNOSIS — Z944 Liver transplant status: Secondary | ICD-10-CM

## 2018-04-08 DIAGNOSIS — S90812A Abrasion, left foot, initial encounter: Secondary | ICD-10-CM | POA: Diagnosis not present

## 2018-04-08 HISTORY — DX: Hyperglycemia, unspecified: R73.9

## 2018-04-08 HISTORY — DX: Vitamin D deficiency, unspecified: E55.9

## 2018-04-08 HISTORY — DX: Contact with and (suspected) exposure to asbestos: Z77.090

## 2018-04-08 NOTE — Progress Notes (Signed)
Patient ID: Candace Hill, female   DOB: 10-25-1945, 72 y.o.   MRN: [id number]   Location:  [facility] OFFICE  Provider: DR Arletha Grippe  Code Status:  Goals of Care:  Advanced Directives 12/20/2017  Does Patient Have a Medical Advance Directive? No  Type of Advance Directive -  Does patient want to make changes to medical advance directive? -  Copy of [place] in Chart? -  Would patient like information on creating a medical advance directive? Yes (MAU/Ambulatory/Procedural Areas - Information given)  Pre-existing out of facility DNR order (yellow form or pink MOST form) -     Chief Complaint  Patient presents with  . Medical Management of Chronic Issues    3 month follow-up, + fall risk   . Health Maintenance    Discuss need for Mammogram  . Immunizations    Discuss recommended vaccines PNA ,Tdap, and Flu vaccine     HPI: Patient is a 72 y.o. female seen today for medical management of chronic diseases.  She is down 10 lbs since last ov. 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  Her new place was roach infested and there are copperheads in the front yard where she walks her frail dog. She has had trouble sleeping. BMs have been loose. She is a poor historian due to psych d/o. Hx obtained from chart.  Labs from July 2019 reviewed. LDL 126; A1c 5.3%; serum glucose 118  Hx hepatic cirrhosis/chronic Hep C hx - s/p liver tx 02/17/2008. Followed by [facility] transplant team Dr Lemmie Evens. Currently takes prograf.  She was tx and cured Hepatitis C by [facility]. LFTs remain slightly elevated (AST 37; ALT 53). Hep C contracted following blood transfusion after SVD in 1950s but was not dx until 1997.  Glaucoma/CRVO OS - tx with eye gtts. 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 She is followed by [facility] Dr Cordelia Pen (ophthamology)  HTN - takes metoprolol and propanolol. She also takes ASA daily  CKD - stage 2. Cr 1.10  Hx nephrolithiasis -  followed by [facility] Dr Alinda Money. She has had stents and lithotripsy in the past. Recent UA revealed microscopic RBCs. She is due to see urology soon.  Hx Vit D deficiency - she was taking weekly Vit D 50K units but is no longer on it.  GERD - stable on omeprazole  Seasonal affective d/o - no longer followed by psychologist due to scheduling misunderstanding. She takes cymbalta. effexor xr ineffective   Hx anemia - stable on iron and folate; Hgb 14.7  TIA (dx in 2016)/hx HA/excessive daytime sleepiness - followed by neurology Dr Rexene Alberts. She was to go for sleep study to r/o OSA but could not complete study. She has taken maxalt prn HA  She is adopted but is aware of birthmother's med hx including Alzheimer's, stroke, DM and HTN   Past Medical History:  Diagnosis Date  . Abnormal uterine bleeding   . Acid reflux   . Asthma    [phone] due to black mold house, no asthma now  . Broken ankle    right  . Broken rib    x2  . Cancer ([place])    liver (in the one that was removed)  . Central retinal vein occlusion few yrs   left eye retinal occlusion  . Complication of anesthesia    IV phenergan  serious mental reaction  . Depression   . Hepatitis C  HEP C  . Hernia, femoral    left  . History of blood transfusion 1971   acquired hepatitis c  . History of kidney stones   . History of renal stone   . History of shingles   . Hypertension    due to profraf  . Kidney stones    x 4 per new patient packet   . Renal stone 11/2012  . Seasonal affective disorder ([place])   . Stroke [facility])    TIA x2 caused by prednisone  . Tailbone injury age 59 and age 15 and few weeks ago   broken x3  . TIA (transient ischemic attack)   . 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 CATARACT EXTRACTION     bilateral  . CHOLECYSTECTOMY  1997  . CYSTOSCOPY W/ URETERAL STENT PLACEMENT  Left 08/14/2016   Procedure: CYSTOSCOPY WITH RETROGRADE PYELOGRAM/URETERAL LEFT URETEROSCOPY AND LEFT STENT PLACEMENT;  Surgeon: Raynelle Bring, MD;  Location: [facility];  Service: Urology;  Laterality: Left;  . CYSTOSCOPY/URETEROSCOPY/HOLMIUM LASER/STENT PLACEMENT Left 07/06/2016   Procedure: CYSTOSCOPY/URETEROSCOPY/ BILATERAL RETROGRADE/HOLMIUM LASER/STENT PLACEMENT/BASKET STONE REMOVAL;  Surgeon: Raynelle Bring, MD;  Location: [facility];  Service: Urology;  Laterality: Left;  . CYSTOSCOPY/URETEROSCOPY/HOLMIUM LASER/STENT PLACEMENT Left 08/06/2016   Procedure: CYSTOSCOPY/URETEROSCOPY/HOLMIUM LASER/STENT PLACEMENT;  Surgeon: Raynelle Bring, MD;  Location: [facility];  Service: Urology;  Laterality: Left;  . CYSTOSCOPY/URETEROSCOPY/HOLMIUM LASER/STENT PLACEMENT Left 08/31/2016   Procedure: CYSTOSCOPY/URETEROSCOPY/HOLMIUM LASER/STENT PLACEMENT/BASKET STONE REMOVAL;  Surgeon: Raynelle Bring, MD;  Location: [facility];  Service: Urology;  Laterality: Left;  . FEMORAL HERNIA REPAIR  1997   left leg  . NASAL SINUS SURGERY  1997   x3  . TONSILLECTOMY  age 76  . 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 TUBAL LIGATION       reports that she has never smoked. She has never used smokeless tobacco. She reports that she does not drink alcohol or use drugs. Social History   Socioeconomic History  . Marital status: Divorced    Spouse name: Not on file  . Number of children: 2  . Years of education: 27  . Highest education level: Not on file  Occupational History  . Occupation: Disabled  Social Needs  . Financial resource strain: Not hard at all  . Food insecurity:    Worry: Never true    Inability: Never true  . Transportation needs:    Medical: No    Non-medical: No  Tobacco Use  . Smoking status: Never Smoker  . Smokeless tobacco: Never Used  Substance and Sexual Activity  . Alcohol use: No  . Drug use: No  . Sexual activity: Never    Comment: 14 years ago  Lifestyle  . Physical activity:    Days per week: 5 days     Minutes per session: 20 min  . Stress: To some extent  Relationships  . Social connections:    Talks on phone: Never    Gets together: Never    Attends religious service: Never    Active member of club or organization: No    Attends meetings of clubs or organizations: Never    Relationship status: Divorced  . Intimate partner violence:    Fear of current or ex partner: Not on file    Emotionally abused: Not on file    Physically abused: Not on file    Forced sexual  activity: Not on file  Other Topics Concern  . Not on file  Social History Narrative   Lives at alone alone.   Right-handed.   No caffeine use.      PSC- as of 10/12/17   Diet: No Fried Foods       Caffeine: Summer time, sweet tea       Married, if yes what year: Divorced       Do you live in a house, apartment, assisted living, condo, trailer, ect: [place], 1 stories, and 1 person       Pets: 1 dog       Current/Past profession: Retail buyer       Exercise: Some, walking dog       Living Will: yes   DNR: yes   POA/HPOA: yes      Functional Status: Patient did not answer   Do you have difficulty bathing or dressing yourself?   Do you have difficulty preparing food or eating?   Do you have difficulty managing your medications?   Do you have difficulty managing your finances?   Do you have difficulty affording your medications?    Family History  Adopted: Yes  Problem Relation Age of Onset  . Stroke Mother   . Alzheimer's disease Mother   . Cancer Mother   . Diabetes Mother        Questionable, per new patient packet   . Arthritis Mother        Questionable, per new patient packet   . Alcoholism Father   . COPD Brother        Heavy smoker, per new patient packet   . Suicidality Sister   . Depression Sister   . COPD Sister   . OCD Daughter   . Personality disorder Daughter   . Alcoholism Son     Allergies  Allergen Reactions  . Codeine Nausea Only  . 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    Marland Kitchen Penicillins Nausea Only and Other (See Comments)    Has patient had a PCN reaction causing immediate rash, facial/tongue/throat swelling, SOB or lightheadedness with hypotension: No Has patient had a PCN reaction causing severe rash involving mucus membranes or skin necrosis: No Has patient had a PCN reaction that required hospitalization No Has patient had a PCN reaction occurring within the last 10 years: No If all of the above answers are "NO", then may proceed with Cephalosporin use.  Marland Kitchen Phenergan [Promethazine] Other (See Comments)    Reaction:  Hallucinations  Pt states that she is only allergic to IV form.   . Sulfa Antibiotics Itching  . Tamsulosin Itching  . Lidocaine Palpitations    Outpatient Encounter Medications as of 04/08/2018  Medication Sig  . acetaminophen (TYLENOL) 500 MG tablet Take 500-1,000 mg by mouth every 6 (six) hours as needed for mild pain, moderate pain or headache.   Marland Kitchen aspirin EC 81 MG tablet Take 81 mg by mouth daily.  . brimonidine (ALPHAGAN P) 0.1 % SOLN 1 drop daily.  . DULoxetine (CYMBALTA) 30 MG capsule Take 90 mg by mouth daily.  . Ferrous Sulfate (IRON) 325 (65 Fe) MG TABS Take 1 tablet by mouth daily.  . folic acid (FOLVITE) 007 MCG tablet Take 400 mcg by mouth daily.  . hydrOXYzine (ATARAX/VISTARIL) 25 MG tablet Take 25 mg by mouth every 6 (six) hours as needed for itching.  . potassium chloride (K-DUR,KLOR-CON)  10 MEQ tablet Take 10 mEq by mouth daily.  Marland Kitchen pyridoxine (B-6) 100 MG tablet Take 100 mg by mouth daily.  . tacrolimus (PROGRAF) 0.5 MG capsule Take 3 by mouth every 12 hours  . [DISCONTINUED] DULoxetine (CYMBALTA) 60 MG capsule Take 60 mg by mouth daily. Takes at [employer]  . [DISCONTINUED] ergocalciferol (VITAMIN D2) 50000 units capsule Take 50,000 Units by mouth every 14 (fourteen) days.    No facility-administered encounter medications on file as of 04/08/2018.     Review of  Systems:  Review of Systems  Unable to perform ROS: Psychiatric disorder    Health Maintenance  Topic Date Due  . TETANUS/TDAP  10/17/1966  . PNA vac Low Risk Adult (1 of 2 - PCV13) 10/16/2012  . MAMMOGRAM  08/10/2016  . INFLUENZA VACCINE  01/13/2018  . COLONOSCOPY  01/26/2023  . DEXA SCAN  Completed  . Hepatitis C Screening  Completed    Physical Exam: Vitals:   04/08/18 1043  BP: 128/80  Pulse: 87  Temp: 98 F (36.7 C)  TempSrc: Oral  SpO2: 98%  Weight: 151 lb (68.5 kg)  Height: '5\' 2"'  (1.575 m)   Body mass index is 27.62 kg/m. Physical Exam  Constitutional: She appears well-developed and well-nourished.  HENT:  Mouth/Throat: Oropharynx is clear and moist. No oropharyngeal exudate.  MMM; no oral thrush  Eyes: Pupils are equal, round, and reactive to light. No scleral icterus.  Neck: Neck supple. Carotid bruit is not present. No tracheal deviation present. No thyromegaly present.  Cardiovascular: Normal rate, regular rhythm and intact distal pulses. Exam reveals no gallop and no friction rub.  Murmur (1/6 SEM) heard. No LE edema b/l. no calf TTP.   Pulmonary/Chest: Effort normal and breath sounds normal. No stridor. No respiratory distress. She has no wheezes. She has no rales.  Abdominal: Soft. Normal appearance and bowel sounds are normal. She exhibits no distension and no mass. There is no hepatomegaly. There is tenderness. There is no rigidity, no rebound and no guarding. No hernia.  Musculoskeletal: She exhibits edema.  Lymphadenopathy:    She has no cervical adenopathy.  Neurological: She is alert. She has normal reflexes.  Skin: Skin is warm and dry. No rash noted.  Medial left heel abrasion red but no d/c or bleeding  Psychiatric: Thought content normal. Her mood appears anxious. Her speech is rapid and/or pressured and tangential. She is hyperactive. She expresses no suicidal ideation. She expresses no suicidal plans.  Thought content is disorganized     Labs reviewed: Basic Metabolic Panel: Recent Labs    12/20/17 1147  NA 143  K 4.0  CL 103  CO2 29  GLUCOSE 118*  BUN 14  CREATININE 1.10*  CALCIUM 9.6  TSH 3.86   Liver Function Tests: Recent Labs    12/20/17 1147  AST 37*  ALT 53*  BILITOT 0.5  PROT 6.9   No results for input(s): LIPASE, AMYLASE in the last 8760 hours. No results for input(s): AMMONIA in the last 8760 hours. CBC: Recent Labs    12/20/17 1147  WBC 7.7  NEUTROABS 5,151  HGB 14.7  HCT 42.5  MCV 88.4  PLT 208   Lipid Panel: Recent Labs    12/20/17 1147  CHOL 197  HDL 52  LDLCALC 126*  TRIG 87  CHOLHDL 3.8   Lab Results  Component Value Date   HGBA1C 5.3 12/31/2017    Procedures since last visit: No results found.  Assessment/Plan   ICD-10-CM  1. Seasonal affective disorder ([place]) F33.8   2. Vitamin D deficiency E55.9 Vitamin D, 25-hydroxy  3. Breast cancer screening Z12.39 MM DIGITAL SCREENING BILATERAL  4. High risk medication use Z79.899 CMP with eGFR([facility])    CBC with Differential/Platelets  5. 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 Essential hypertension I10   7. Hyperglycemia R73.9   8. Abrasion of left heel, initial encounter S90.812A   9. Asbestos exposure Z77.090   10. Weight loss R63.4    likely related to increased stressors   11. Need for immunization against influenza Z23 Flu Vaccine QUAD 36+ mos IM   RECOMMEND YOU SEE A MENTAL HEALTH SPECIALIST FOR YOUR SEASONAL AFFECTIVE DISORDER  PLEASE FOLLOW UP WITH [facility] FOR LIVER TRANSPLANT FOLLOW UP  Will call with lab results  Continue current medications as ordered  Follow up with other specialists as scheduled  Flu shot given today  Follow up in 3 mos for routine visit with Sherrie Mustache, NP-C or sooner if need be.   Soriah Leeman S. Perlie Gold  [facility] [address] [place], [place]  [address] [phone] Cell (Monday-Friday 8 AM - 5 PM) [phone] After 5 PM and follow prompts

## 2018-04-08 NOTE — Patient Instructions (Addendum)
RECOMMEND YOU SEE A MENTAL HEALTH SPECIALIST FOR YOUR SEASONAL AFFECTIVE DISORDER  PLEASE FOLLOW UP WITH [facility] FOR LIVER TRANSPLANT FOLLOW UP  Will call with lab results  Continue current medications as ordered  Follow up with other specialists as scheduled  Flu shot given today  Follow up in 3 mos for routine visit with Sherrie Mustache, NP-C or sooner if need be.   Abrasion An abrasion is a cut or scrape on the surface of your skin. An abrasion does not go through all of the layers of your skin. It is important to take good care of your abrasion to prevent infection. Follow these instructions at home: Medicines  Take or apply medicines only as told by your doctor.  If you were prescribed an antibiotic ointment, finish all of it even if you start to feel better. Wound care  Clean the wound with mild soap and water 2-3 times per day or as told by your doctor. Pat your wound dry with a clean towel. Do not rub it.  There are many ways to close and cover a wound. Follow instructions from your doctor about: ? How to take care of your wound. ? When and how you should change your bandage (dressing). ? When and how you should take off your dressing.  Check your wound every day for signs of infection. Watch for: ? Redness, swelling, or pain. ? Fluid, blood, or pus. General instructions  Keep the dressing dry as told by your doctor. Do not take baths, swim, use a hot tub, or do anything that would put your wound underwater until your doctor says it is okay.  If there is swelling, raise (elevate) the injured area above the level of your heart while you are sitting or lying down.  Keep all follow-up visits as told by your doctor. This is important. Contact a doctor if:  You were given a tetanus shot and you have any of these where the needle went in: ? Swelling. ? Very bad pain. ? Redness. ? Bleeding.  Medicine does not help your pain.  You have any of these at the  site of the wound: ? More redness. ? More swelling. ? More pain. Get help right away if:  You have a red streak going away from your wound.  You have a fever.  You have fluid, blood, or pus coming from your wound.  There is a bad smell coming from your wound. This information is not intended to replace advice given to you by your health care provider. Make sure you discuss any questions you have with your health care provider. Document Released: 11/18/2007 Document Revised: 11/07/2015 Document Reviewed: 05/30/2014 Elsevier Interactive Patient Education  [employer].

## 2018-04-09 LAB — CBC WITH DIFFERENTIAL/PLATELET
BASOS ABS: 40 {cells}/uL (ref 0–200)
Basophils Relative: 0.5 %
EOS ABS: 144 {cells}/uL (ref 15–500)
Eosinophils Relative: 1.8 %
HCT: 42.3 % (ref 35.0–45.0)
HEMOGLOBIN: 14.6 g/dL (ref 11.7–15.5)
Lymphs Abs: 1976 cells/uL (ref 850–3900)
MCH: 31.1 pg (ref 27.0–33.0)
MCHC: 34.5 g/dL (ref 32.0–36.0)
MCV: 90 fL (ref 80.0–100.0)
MONOS PCT: 6.9 %
MPV: 10.3 fL (ref 7.5–12.5)
NEUTROS ABS: 5288 {cells}/uL (ref 1500–7800)
NEUTROS PCT: 66.1 %
Platelets: 200 10*3/uL (ref 140–400)
RBC: 4.7 10*6/uL (ref 3.80–5.10)
RDW: 13.6 % (ref 11.0–15.0)
Total Lymphocyte: 24.7 %
WBC mixed population: 552 cells/uL (ref 200–950)
WBC: 8 10*3/uL (ref 3.8–10.8)

## 2018-04-09 LAB — COMPLETE METABOLIC PANEL WITH GFR
AG RATIO: 2 (calc) (ref 1.0–2.5)
ALBUMIN MSPROF: 4.6 g/dL (ref 3.6–5.1)
ALT: 61 U/L — AB (ref 6–29)
AST: 43 U/L — ABNORMAL HIGH (ref 10–35)
Alkaline phosphatase (APISO): 57 U/L (ref 33–130)
BILIRUBIN TOTAL: 0.7 mg/dL (ref 0.2–1.2)
BUN / CREAT RATIO: 12 (calc) (ref 6–22)
BUN: 12 mg/dL (ref 7–25)
CALCIUM: 9.7 mg/dL (ref 8.6–10.4)
CO2: 31 mmol/L (ref 20–32)
Chloride: 103 mmol/L (ref 98–110)
Creat: 1.04 mg/dL — ABNORMAL HIGH (ref 0.60–0.93)
GFR, EST AFRICAN AMERICAN: 63 mL/min/{1.73_m2} (ref >=60)
GFR, EST NON AFRICAN AMERICAN: 54 mL/min/{1.73_m2} — AB (ref >=60)
GLOBULIN: 2.3 g/dL (ref 1.9–3.7)
Glucose, Bld: 94 mg/dL (ref 65–139)
POTASSIUM: 4.1 mmol/L (ref 3.5–5.3)
SODIUM: 142 mmol/L (ref 135–146)
TOTAL PROTEIN: 6.9 g/dL (ref 6.1–8.1)

## 2018-04-09 LAB — VITAMIN D 25 HYDROXY (VIT D DEFICIENCY, FRACTURES): VIT D 25 HYDROXY: 34 ng/mL (ref 30–100)

## 2018-04-12 DIAGNOSIS — Z87442 Personal history of urinary calculi: Secondary | ICD-10-CM | POA: Diagnosis not present

## 2018-06-14 ENCOUNTER — Ambulatory Visit (INDEPENDENT_AMBULATORY_CARE_PROVIDER_SITE_OTHER): Payer: Medicare Other | Admitting: Nurse Practitioner

## 2018-06-14 ENCOUNTER — Ambulatory Visit: Payer: Medicare Other | Admitting: Nurse Practitioner

## 2018-06-14 ENCOUNTER — Encounter: Payer: Self-pay | Admitting: Nurse Practitioner

## 2018-06-14 ENCOUNTER — Ambulatory Visit: Payer: Medicare Other | Admitting: Adult Health

## 2018-06-14 VITALS — BP 140/80 | HR 85 | Temp 97.8°F | Resp 20 | Ht 62.0 in | Wt 144.8 lb

## 2018-06-14 DIAGNOSIS — J0140 Acute pansinusitis, unspecified: Secondary | ICD-10-CM

## 2018-06-14 MED ORDER — AZITHROMYCIN 250 MG PO TABS: ORAL_TABLET | ORAL | 0 refills | Status: DC

## 2018-06-14 NOTE — Patient Instructions (Signed)
To use saline at least twice daily to wash sinuses To use flonase 1 spray into both nares daily for 1 week To use azithromycin as prescribed Increase water intake May use tylenol 325 mg 2 tablets every 6 hours as needed aches and pains or sore throat humidifier in the home to help with the dry air Avoid forcefully blowing nose   Sinusitis, Adult Sinusitis is inflammation of your sinuses. Sinuses are hollow spaces in the bones around your face. Your sinuses are located:  Around your eyes.  In the middle of your forehead.  Behind your nose.  In your cheekbones. Mucus normally drains out of your sinuses. When your nasal tissues become inflamed or swollen, mucus can become trapped or blocked. This allows bacteria, viruses, and fungi to grow, which leads to infection. Most infections of the sinuses are caused by a virus. Sinusitis can develop quickly. It can last for up to 4 weeks (acute) or for more than 12 weeks (chronic). Sinusitis often develops after a cold. What are the causes? This condition is caused by anything that creates swelling in the sinuses or stops mucus from draining. This includes:  Allergies.  Asthma.  Infection from bacteria or viruses.  Deformities or blockages in your nose or sinuses.  Abnormal growths in the nose (nasal polyps).  Pollutants, such as chemicals or irritants in the air.  Infection from fungi (rare). What increases the risk? You are more likely to develop this condition if you:  Have a weak body defense system (immune system).  Do a lot of swimming or diving.  Overuse nasal sprays.  Smoke. What are the signs or symptoms? The main symptoms of this condition are pain and a feeling of pressure around the affected sinuses. Other symptoms include:  Stuffy nose or congestion.  Thick drainage from your nose.  Swelling and warmth over the affected sinuses.  Headache.  Upper toothache.  A cough that may get worse at night.  Extra  mucus that collects in the throat or the back of the nose (postnasal drip).  Decreased sense of smell and taste.  Fatigue.  A fever.  Sore throat.  Bad breath. How is this diagnosed? This condition is diagnosed based on:  Your symptoms.  Your medical history.  A physical exam.  Tests to find out if your condition is acute or chronic. This may include: ? Checking your nose for nasal polyps. ? Viewing your sinuses using a device that has a light (endoscope). ? Testing for allergies or bacteria. ? Imaging tests, such as an MRI or CT scan. In rare cases, a bone biopsy may be done to rule out more serious types of fungal sinus disease. How is this treated? Treatment for sinusitis depends on the cause and whether your condition is chronic or acute.  If caused by a virus, your symptoms should go away on their own within 10 days. You may be given medicines to relieve symptoms. They include: ? Medicines that shrink swollen nasal passages (topical intranasal decongestants). ? Medicines that treat allergies (antihistamines). ? A spray that eases inflammation of the nostrils (topical intranasal corticosteroids). ? Rinses that help get rid of thick mucus in your nose (nasal saline washes).  If caused by bacteria, your health care provider may recommend waiting to see if your symptoms improve. Most bacterial infections will get better without antibiotic medicine. You may be given antibiotics if you have: ? A severe infection. ? A weak immune system.  If caused by narrow nasal passages  or nasal polyps, you may need to have surgery. Follow these instructions at home: Medicines  Take, use, or apply over-the-counter and prescription medicines only as told by your health care provider. These may include nasal sprays.  If you were prescribed an antibiotic medicine, take it as told by your health care provider. Do not stop taking the antibiotic even if you start to feel better. Hydrate and  humidify   Drink enough fluid to keep your urine pale yellow. Staying hydrated will help to thin your mucus.  Use a cool mist humidifier to keep the humidity level in your home above 50%.  Inhale steam for 10-15 minutes, 3-4 times a day, or as told by your health care provider. You can do this in the bathroom while a hot shower is running.  Limit your exposure to cool or dry air. Rest  Rest as much as possible.  Sleep with your head raised (elevated).  Make sure you get enough sleep each night. General instructions   Apply a warm, moist washcloth to your face 3-4 times a day or as told by your health care provider. This will help with discomfort.  Wash your hands often with soap and water to reduce your exposure to germs. If soap and water are not available, use hand sanitizer.  Do not smoke. Avoid being around people who are smoking (secondhand smoke).  Keep all follow-up visits as told by your health care provider. This is important. Contact a health care provider if:  You have a fever.  Your symptoms get worse.  Your symptoms do not improve within 10 days. Get help right away if:  You have a severe headache.  You have persistent vomiting.  You have severe pain or swelling around your face or eyes.  You have vision problems.  You develop confusion.  Your neck is stiff.  You have trouble breathing. Summary  Sinusitis is soreness and inflammation of your sinuses. Sinuses are hollow spaces in the bones around your face.  This condition is caused by nasal tissues that become inflamed or swollen. The swelling traps or blocks the flow of mucus. This allows bacteria, viruses, and fungi to grow, which leads to infection.  If you were prescribed an antibiotic medicine, take it as told by your health care provider. Do not stop taking the antibiotic even if you start to feel better.  Keep all follow-up visits as told by your health care provider. This is  important. This information is not intended to replace advice given to you by your health care provider. Make sure you discuss any questions you have with your health care provider. Document Released: 06/01/2005 Document Revised: 11/01/2017 Document Reviewed: 11/01/2017 Elsevier Interactive Patient Education  2019 [employer].

## 2018-06-14 NOTE — Progress Notes (Signed)
Careteam: Patient Care Team: Gildardo Cranker, DO as PCP - General (Internal Medicine) Rolm Bookbinder, MD as Consulting Physician (Dermatology) Gerarda Fraction, MD as Referring Physician (Ophthalmology) Raynelle Bring, MD as Consulting Physician (Urology)  Advanced Directive information    Allergies  Allergen Reactions  . Codeine Nausea Only  . 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    Marland Kitchen Penicillins Nausea Only and Other (See Comments)    Has patient had a PCN reaction causing immediate rash, facial/tongue/throat swelling, SOB or lightheadedness with hypotension: No Has patient had a PCN reaction causing severe rash involving mucus membranes or skin necrosis: No Has patient had a PCN reaction that required hospitalization No Has patient had a PCN reaction occurring within the last 10 years: No If all of the above answers are "NO", then may proceed with Cephalosporin use.  Marland Kitchen Phenergan [Promethazine] Other (See Comments)    Reaction:  Hallucinations  Pt states that she is only allergic to IV form.   . Sulfa Antibiotics Itching  . Tamsulosin Itching  . Lidocaine Palpitations    Chief Complaint  Patient presents with  . Acute Visit    C/o- congestion, larnygitis x 2.5 weeks     HPI: Patient is a 72 y.o. female seen in the office today due to congestion.  2 week ago started with sore throat. With nasal mucous- symptoms of a cold. Ongoing sore throat. Increase fatigue.  Last night she was having trouble talking No fever but more fatigue.  No achy.  Increase nasal congestion with pressure and green mucous.  Productive cough.  No chest congestion, shortness of breath or wheezing.  Has not taken any medication for it.   Review of Systems:  Review of Systems  Constitutional: Positive for malaise/fatigue. Negative for chills and fever.  HENT: Positive for congestion. Negative for ear discharge, ear pain, hearing loss, sinus pain and  sore throat.   Respiratory: Positive for cough. Negative for hemoptysis, sputum production, shortness of breath, wheezing and stridor.   Cardiovascular: Negative for chest pain and palpitations.    Past Medical History:  Diagnosis Date  . Abnormal uterine bleeding   . Acid reflux   . Asthma    [phone] due to black mold house, no asthma now  . Broken ankle    right  . Broken rib    x2  . Cancer ([place])    liver (in the one that was removed)  . Central retinal vein occlusion few yrs   left eye retinal occlusion  . Complication of anesthesia    IV phenergan  serious mental reaction  . Depression   . Hepatitis C    HEP C  . Hernia, femoral    left  . History of blood transfusion 1971   acquired hepatitis c  . History of kidney stones   . History of renal stone   . History of shingles   . Hypertension    due to profraf  . Kidney stones    x 4 per new patient packet   . Renal stone 11/2012  . Seasonal affective disorder ([place])   . Stroke [facility])    TIA x2 caused by prednisone  . Tailbone injury age 76 and age 18 and few weeks ago   broken x3  . TIA (transient ischemic attack)   . 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 CATARACT EXTRACTION     bilateral  . CHOLECYSTECTOMY  1997  . CYSTOSCOPY W/ URETERAL STENT PLACEMENT Left 08/14/2016   Procedure: CYSTOSCOPY WITH RETROGRADE PYELOGRAM/URETERAL LEFT URETEROSCOPY AND LEFT STENT PLACEMENT;  Surgeon: Raynelle Bring, MD;  [facility]: [facility];  Service: Urology;  Laterality: Left;  . CYSTOSCOPY/URETEROSCOPY/HOLMIUM LASER/STENT PLACEMENT Left 07/06/2016   Procedure: CYSTOSCOPY/URETEROSCOPY/ BILATERAL RETROGRADE/HOLMIUM LASER/STENT PLACEMENT/BASKET STONE REMOVAL;  Surgeon: Raynelle Bring, MD;  [facility];  Service: Urology;  Laterality: Left;  . CYSTOSCOPY/URETEROSCOPY/HOLMIUM LASER/STENT PLACEMENT Left 08/06/2016    Procedure: CYSTOSCOPY/URETEROSCOPY/HOLMIUM LASER/STENT PLACEMENT;  Surgeon: Raynelle Bring, MD;  [facility];  Service: Urology;  Laterality: Left;  . CYSTOSCOPY/URETEROSCOPY/HOLMIUM LASER/STENT PLACEMENT Left 08/31/2016   Procedure: CYSTOSCOPY/URETEROSCOPY/HOLMIUM LASER/STENT PLACEMENT/BASKET STONE REMOVAL;  Surgeon: Raynelle Bring, MD;  [facility];  Service: Urology;  Laterality: Left;  . FEMORAL HERNIA REPAIR  1997   left leg  . NASAL SINUS SURGERY  1997   x3  . TONSILLECTOMY  age 45  . 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 TUBAL LIGATION     Social History:   reports that she has never smoked. She has never used smokeless tobacco. She reports that she does not drink alcohol or use drugs.  Family History  Adopted: Yes  Problem Relation Age of Onset  . Stroke Mother   . Alzheimer's disease Mother   . Cancer Mother   . Diabetes Mother        Questionable, per new patient packet   . Arthritis Mother        Questionable, per new patient packet   . Alcoholism Father   . COPD Brother        Heavy smoker, per new patient packet   . Suicidality Sister   . Depression Sister   . COPD Sister   . OCD Daughter   . Personality disorder Daughter   . Alcoholism Son     Medications: Patient's Medications  New Prescriptions   No medications on file  Previous Medications   ACETAMINOPHEN (TYLENOL) 500 MG TABLET    Take 500-1,000 mg by mouth every 6 (six) hours as needed for mild pain, moderate pain or headache.    ASPIRIN EC 81 MG TABLET    Take 81 mg by mouth daily.   BRIMONIDINE (ALPHAGAN P) 0.1 % SOLN    1 drop daily.   DULOXETINE (CYMBALTA) 30 MG CAPSULE    Take 90 mg by mouth daily.   FERROUS SULFATE (IRON) 325 (65 FE) MG TABS    Take 1 tablet by mouth daily.   FOLIC ACID (FOLVITE) 269 MCG TABLET    Take 400 mcg by mouth daily.   HYDROXYZINE (ATARAX/VISTARIL) 25 MG TABLET    Take 25 mg by mouth every 6 (six) hours as needed for itching.   POTASSIUM CHLORIDE (K-DUR,KLOR-CON) 10 MEQ  TABLET    Take 10 mEq by mouth daily.   PYRIDOXINE (B-6) 100 MG TABLET    Take 100 mg by mouth daily.   TACROLIMUS (PROGRAF) 0.5 MG CAPSULE    Take 3 by mouth every 12 hours  Modified Medications   No medications on file  Discontinued Medications   No medications on file     Physical Exam:  Vitals:   06/14/18 1112  BP: 140/80  Pulse: 85  Resp: 20  Temp: 97.8 F (36.6 C)  TempSrc: Oral  SpO2: 98%  Weight: 144 lb 12.8 oz (65.7 kg)  Height: 5\' 2"  (  1.575 m)   Body mass index is 26.48 kg/m.  Physical Exam Constitutional:      Appearance: Normal appearance. She is well-developed.  HENT:     Head: Normocephalic and atraumatic.     Right Ear: Tympanic membrane, ear canal and external ear normal.     Left Ear: Tympanic membrane, ear canal and external ear normal.     Nose: Congestion and rhinorrhea present.     Mouth/Throat:     Mouth: Mucous membranes are moist.     Pharynx: No oropharyngeal exudate.  Eyes:     General: No scleral icterus.    Pupils: Pupils are equal, round, and reactive to light.  Neck:     Musculoskeletal: Neck supple.     Thyroid: No thyromegaly.     Vascular: No carotid bruit.     Trachea: No tracheal deviation.  Cardiovascular:     Rate and Rhythm: Normal rate and regular rhythm.     Heart sounds: Murmur (1/6 SEM) present. No friction rub. No gallop.      Comments: No LE edema b/l. no calf TTP.  Pulmonary:     Effort: Pulmonary effort is normal. No respiratory distress.     Breath sounds: Normal breath sounds. No stridor. No wheezing or rales.  Abdominal:     Palpations: Abdomen is not rigid. There is no hepatomegaly.  Lymphadenopathy:     Cervical: No cervical adenopathy.  Skin:    General: Skin is warm and dry.  Neurological:     Mental Status: She is alert.     Motor: Weakness:      Deep Tendon Reflexes: Reflexes are normal and symmetric.  Psychiatric:        Mood and Affect: Mood is anxious.        Thought Content: Thought content  normal. Thought content does not include suicidal ideation. Thought content does not include suicidal plan.     Labs reviewed: Basic Metabolic Panel: Recent Labs    12/20/17 1147 04/08/18 1143  NA 143 142  K 4.0 4.1  CL 103 103  CO2 29 31  GLUCOSE 118* 94  BUN 14 12  CREATININE 1.10* 1.04*  CALCIUM 9.6 9.7  TSH 3.86  --    Liver Function Tests: Recent Labs    12/20/17 1147 04/08/18 1143  AST 37* 43*  ALT 53* 61*  BILITOT 0.5 0.7  PROT 6.9 6.9   No results for input(s): LIPASE, AMYLASE in the last 8760 hours. No results for input(s): AMMONIA in the last 8760 hours. CBC: Recent Labs    12/20/17 1147 04/08/18 1143  WBC 7.7 8.0  NEUTROABS 5,151 5,288  HGB 14.7 14.6  HCT 42.5 42.3  MCV 88.4 90.0  PLT 208 200   Lipid Panel: Recent Labs    12/20/17 1147  CHOL 197  HDL 52  LDLCALC 126*  TRIG 87  CHOLHDL 3.8   TSH: Recent Labs    12/20/17 1147  TSH 3.86   A1C: Lab Results  Component Value Date   HGBA1C 5.3 12/31/2017     Assessment/Plan 1. Acute non-recurrent pansinusitis To use saline at least twice daily to wash sinuses To use flonase 1 spray into both nares daily for 1 week To use azithromycin as prescribed Increase water intake May use tylenol 325 mg 2 tablets every 6 hours as needed aches and pains or sore throat humidifier in the home to help with the dry air Avoid forcefully blowing nose - azithromycin (ZITHROMAX) 250 MG tablet;  2 tablets today by mouth then 1 tablet by mouth daily until complete  Dispense: 6 tablet; Refill: 0  Next appt: 07/12/2018 Carlos American. [place], [facility] [phone]

## 2018-07-12 ENCOUNTER — Encounter: Payer: Self-pay | Admitting: Nurse Practitioner

## 2018-07-12 ENCOUNTER — Ambulatory Visit (INDEPENDENT_AMBULATORY_CARE_PROVIDER_SITE_OTHER): Payer: Medicare Other | Admitting: Nurse Practitioner

## 2018-07-12 VITALS — BP 118/80 | HR 68 | Temp 97.3°F | Ht 62.0 in | Wt 146.0 lb

## 2018-07-12 DIAGNOSIS — K219 Gastro-esophageal reflux disease without esophagitis: Secondary | ICD-10-CM | POA: Diagnosis not present

## 2018-07-12 DIAGNOSIS — E782 Mixed hyperlipidemia: Secondary | ICD-10-CM

## 2018-07-12 DIAGNOSIS — F338 Other recurrent depressive disorders: Secondary | ICD-10-CM | POA: Diagnosis not present

## 2018-07-12 DIAGNOSIS — Z944 Liver transplant status: Secondary | ICD-10-CM | POA: Diagnosis not present

## 2018-07-12 DIAGNOSIS — Z23 Encounter for immunization: Secondary | ICD-10-CM | POA: Diagnosis not present

## 2018-07-12 DIAGNOSIS — I10 Essential (primary) hypertension: Secondary | ICD-10-CM | POA: Diagnosis not present

## 2018-07-12 DIAGNOSIS — D509 Iron deficiency anemia, unspecified: Secondary | ICD-10-CM | POA: Diagnosis not present

## 2018-07-12 MED ORDER — TETANUS-DIPHTH-ACELL PERTUSSIS 5-2.5-18.5 LF-MCG/0.5 IM SUSP: 0.5000 mL | Freq: Once | INTRAMUSCULAR | 0 refills | Status: AC

## 2018-07-12 MED ORDER — ZOSTER VAC RECOMB ADJUVANTED 50 MCG/0.5ML IM SUSR: 0.5000 mL | Freq: Once | INTRAMUSCULAR | 1 refills | Status: AC

## 2018-07-12 NOTE — Progress Notes (Signed)
Careteam: Patient Care Team: Lauree Chandler, NP as PCP - General (Geriatric Medicine) Rolm Bookbinder, MD as Consulting Physician (Dermatology) Gerarda Fraction, MD as Referring Physician (Ophthalmology) Raynelle Bring, MD as Consulting Physician (Urology)  Advanced Directive information    Allergies  Allergen Reactions  . Codeine Nausea Only  . 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    Marland Kitchen Penicillins Nausea Only and Other (See Comments)    Has patient had a PCN reaction causing immediate rash, facial/tongue/throat swelling, SOB or lightheadedness with hypotension: No Has patient had a PCN reaction causing severe rash involving mucus membranes or skin necrosis: No Has patient had a PCN reaction that required hospitalization No Has patient had a PCN reaction occurring within the last 10 years: No If all of the above answers are "NO", then may proceed with Cephalosporin use.  Marland Kitchen Phenergan [Promethazine] Other (See Comments)    Reaction:  Hallucinations  Pt states that she is only allergic to IV form.   . Sulfa Antibiotics Itching  . Tamsulosin Itching  . Lidocaine Palpitations    Chief Complaint  Patient presents with  . Medical Management of Chronic Issues    3 month follow-up, moderate fall risk   . Best Practice Recommendations    Discuss need for Hep C   . Immunizations    Discuss TDaP and Shingrix      HPI: Patient is a 73 y.o. female seen in the office today  for routine follow up. former pt of Dr Eulas Post  Hx hepatic cirrhosis/chronic Hep C hx - s/p liver transplant 02/17/2008. Followed by [facility] transplant team Dr Lemmie Evens. Currently takes prograf. She was tx and cured Hepatitis C by [facility]. LFTs remain slightly elevated (AST37; ALT53). Hep C contracted following blood transfusion after SVD in 1950s but was not dx until 1997. Has not followed up in a year.   Glaucoma/CRVO OS - tx with eye gtts. She is legally blind in left eye.  She is followed by [facility] Dr Cordelia Pen (ophthamology)  HTN - off medication, blood pressure stable. She also takes ASA daily  CKD - stage 2. Cr 1.10  Hx nephrolithiasis - followed by [facility] Dr Alinda Money. She has had stents and lithotripsy in the past. Recent UA revealed microscopic RBCs. Taking potassium due to this.   Hx Vit D deficiency -she was takingweekly Vit D 50K units but is no longer on it. Not currently taking vit D supplement   GERD - stable, not on omeprazole at this time. Diet modifications.   Seasonal affective d/o - following with psychology- recently started with a new one bu does not feel like its beneficial.  She takes cymbalta. Had a hard time around the holidays.   Hx anemia - stable on iron and folate; Hgb 14.7  TIA (dx in 2016)/hx HA/excessive daytime sleepiness - followed by neurology Dr Rexene Alberts. She was to go for sleep study to r/o OSA but could not complete study. She has taken maxalt prn HA  She is adopted but is aware of birthmother's med hx including Alzheimer's, stroke, DM and HTN    Review of Systems:  Review of Systems  Constitutional: Positive for malaise/fatigue. Negative for chills, fever and weight loss.  HENT: Negative for tinnitus.   Respiratory: Negative for cough, sputum production and shortness of breath.   Cardiovascular: Negative for chest pain, palpitations and leg swelling.  Gastrointestinal: Negative for abdominal pain, constipation, diarrhea and heartburn.  Genitourinary: Negative for dysuria, frequency and urgency.  Musculoskeletal: Negative for back pain, falls, joint pain and myalgias.  Skin: Negative.   Neurological: Negative for dizziness and headaches.  Psychiatric/Behavioral: Positive for depression. Negative for memory loss. The patient is nervous/anxious. The patient does not have insomnia.     Past Medical History:  Diagnosis Date  . Abnormal uterine bleeding   . Acid reflux   . Asthma    [phone] due to  black mold house, no asthma now  . Broken ankle    right  . Broken rib    x2  . Cancer ([place])    liver (in the one that was removed)  . Central retinal vein occlusion few yrs   left eye retinal occlusion  . Complication of anesthesia    IV phenergan  serious mental reaction  . Depression   . Hepatitis C    HEP C  . Hernia, femoral    left  . History of blood transfusion 1971   acquired hepatitis c  . History of kidney stones   . History of renal stone   . History of shingles   . Hypertension    due to profraf  . Kidney stones    x 4 per new patient packet   . Renal stone 11/2012  . Seasonal affective disorder ([place])   . Stroke [facility])    TIA x2 caused by prednisone  . Tailbone injury age 72 and age 13 and few weeks ago   broken x3  . TIA (transient ischemic attack)   . 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 CATARACT EXTRACTION     bilateral  . CHOLECYSTECTOMY  1997  . CYSTOSCOPY W/ URETERAL STENT PLACEMENT Left 08/14/2016   Procedure: CYSTOSCOPY WITH RETROGRADE PYELOGRAM/URETERAL LEFT URETEROSCOPY AND LEFT STENT PLACEMENT;  Surgeon: Raynelle Bring, MD;  [facility];  Service: Urology;  Laterality: Left;  . CYSTOSCOPY/URETEROSCOPY/HOLMIUM LASER/STENT PLACEMENT Left 07/06/2016   Procedure: CYSTOSCOPY/URETEROSCOPY/ BILATERAL RETROGRADE/HOLMIUM LASER/STENT PLACEMENT/BASKET STONE REMOVAL;  Surgeon: Raynelle Bring, MD;  [facility];  Service: Urology;  Laterality: Left;  . CYSTOSCOPY/URETEROSCOPY/HOLMIUM LASER/STENT PLACEMENT Left 08/06/2016   Procedure: CYSTOSCOPY/URETEROSCOPY/HOLMIUM LASER/STENT PLACEMENT;  Surgeon: Raynelle Bring, MD;  [facility];  Service: Urology;  Laterality: Left;  . CYSTOSCOPY/URETEROSCOPY/HOLMIUM LASER/STENT PLACEMENT Left 08/31/2016   Procedure: CYSTOSCOPY/URETEROSCOPY/HOLMIUM LASER/STENT PLACEMENT/BASKET STONE REMOVAL;  Surgeon:  Raynelle Bring, MD;  [facility];  Service: Urology;  Laterality: Left;  . FEMORAL HERNIA REPAIR  1997   left leg  . NASAL SINUS SURGERY  1997   x3  . TONSILLECTOMY  age 49  . 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 TUBAL LIGATION     Social History:   reports that she has never smoked. She has never used smokeless tobacco. She reports that she does not drink alcohol or use drugs.  Family History  Adopted: Yes  Problem Relation Age of Onset  . Stroke Mother   . Alzheimer's disease Mother   . Cancer Mother   . Diabetes Mother        Questionable, per new patient packet   . Arthritis Mother        Questionable, per new patient packet   . Alcoholism Father   . COPD Brother        Heavy smoker, per new patient packet   . Suicidality Sister   . Depression Sister   .  COPD Sister   . OCD Daughter   . Personality disorder Daughter   . Alcoholism Son     Medications: Patient's Medications  New Prescriptions   No medications on file  Previous Medications   ACETAMINOPHEN (TYLENOL) 500 MG TABLET    Take 500-1,000 mg by mouth every 6 (six) hours as needed for mild pain, moderate pain or headache.    ASPIRIN EC 81 MG TABLET    Take 81 mg by mouth daily.   BRIMONIDINE (ALPHAGAN P) 0.1 % SOLN    1 drop daily.   DULOXETINE (CYMBALTA) 30 MG CAPSULE    Take 90 mg by mouth daily.   FERROUS SULFATE (IRON) 325 (65 FE) MG TABS    Take 1 tablet by mouth daily.   FOLIC ACID (FOLVITE) 947 MCG TABLET    Take 400 mcg by mouth daily.   HYDROXYZINE (ATARAX/VISTARIL) 25 MG TABLET    Take 25 mg by mouth every 6 (six) hours as needed for itching.   POTASSIUM CHLORIDE (K-DUR,KLOR-CON) 10 MEQ TABLET    Take 10 mEq by mouth daily.   PYRIDOXINE (B-6) 100 MG TABLET    Take 100 mg by mouth daily.   TACROLIMUS (PROGRAF) 0.5 MG CAPSULE    Take 3 by mouth every 12 hours  Modified Medications   Modified Medication Previous Medication   TDAP (BOOSTRIX) 5-2.5-18.5 LF-MCG/0.5 INJECTION Tdap (BOOSTRIX) 5-2.5-18.5  LF-MCG/0.5 injection      Inject 0.5 mLs into the muscle once for 1 dose.    Inject 0.5 mLs into the muscle once.   ZOSTER VACCINE ADJUVANTED [facility]) INJECTION Zoster Vaccine Adjuvanted [facility]) injection      Inject 0.5 mLs into the muscle once for 1 dose.    Inject 0.5 mLs into the muscle once.  Discontinued Medications   AZITHROMYCIN (ZITHROMAX) 250 MG TABLET    2 tablets today by mouth then 1 tablet by mouth daily until complete     Physical Exam:  Vitals:   07/12/18 1514  BP: 118/80  Pulse: 68  Temp: (!) 97.3 F (36.3 C)  TempSrc: Oral  SpO2: 98%  Weight: 146 lb (66.2 kg)  Height: 5\' 2"  (1.575 m)   Body mass index is 26.7 kg/m.  Physical Exam Constitutional:      Appearance: Normal appearance. She is well-developed.  HENT:     Head: Normocephalic and atraumatic.     Right Ear: Tympanic membrane, ear canal and external ear normal.     Left Ear: Tympanic membrane, ear canal and external ear normal.     Nose: Congestion and rhinorrhea present.     Mouth/Throat:     Mouth: Mucous membranes are moist.     Pharynx: No oropharyngeal exudate.  Eyes:     General: No scleral icterus.    Pupils: Pupils are equal, round, and reactive to light.  Neck:     Musculoskeletal: Neck supple.     Thyroid: No thyromegaly.     Vascular: No carotid bruit.     Trachea: No tracheal deviation.  Cardiovascular:     Rate and Rhythm: Normal rate and regular rhythm.     Heart sounds: Murmur (1/6 SEM) present. No friction rub. No gallop.      Comments: No LE edema b/l. no calf TTP.  Pulmonary:     Effort: Pulmonary effort is normal. No respiratory distress.     Breath sounds: Normal breath sounds. No stridor. No wheezing or rales.  Abdominal:     Palpations: Abdomen is  not rigid. There is no hepatomegaly.  Lymphadenopathy:     Cervical: No cervical adenopathy.  Skin:    General: Skin is warm and dry.     Coloration: Skin is not pale.  Neurological:     Mental Status: She is  alert.     Motor: Weakness:      Deep Tendon Reflexes: Reflexes are normal and symmetric.  Psychiatric:        Mood and Affect: Mood is anxious.        Thought Content: Thought content normal. Thought content does not include suicidal ideation. Thought content does not include suicidal plan.     Labs reviewed: Basic Metabolic Panel: Recent Labs    12/20/17 1147 04/08/18 1143  NA 143 142  K 4.0 4.1  CL 103 103  CO2 29 31  GLUCOSE 118* 94  BUN 14 12  CREATININE 1.10* 1.04*  CALCIUM 9.6 9.7  TSH 3.86  --    Liver Function Tests: Recent Labs    12/20/17 1147 04/08/18 1143  AST 37* 43*  ALT 53* 61*  BILITOT 0.5 0.7  PROT 6.9 6.9   No results for input(s): LIPASE, AMYLASE in the last 8760 hours. No results for input(s): AMMONIA in the last 8760 hours. CBC: Recent Labs    12/20/17 1147 04/08/18 1143  WBC 7.7 8.0  NEUTROABS 5,151 5,288  HGB 14.7 14.6  HCT 42.5 42.3  MCV 88.4 90.0  PLT 208 200   Lipid Panel: Recent Labs    12/20/17 1147  CHOL 197  HDL 52  LDLCALC 126*  TRIG 87  CHOLHDL 3.8   TSH: Recent Labs    12/20/17 1147  TSH 3.86   A1C: Lab Results  Component Value Date   HGBA1C 5.3 12/31/2017     Assessment/Plan 1. Need for vaccination with 13-polyvalent pneumococcal conjugate vaccine -given today  2. Seasonal affective disorder ([place]) Ongoing follow up with psychiatry and psychology   3. Essential hypertension -stable off medication - CBC with Differential/Platelets  4. Gastroesophageal reflux disease, esophagitis presence not specified Off omeprazole, no recurrent symptoms  5. Liver transplanted [facility]) Number given to clinic, encouraged to follow up.  - COMPLETE METABOLIC PANEL WITH GFR  6. Iron deficiency anemia, unspecified iron deficiency anemia type -continues on supplement. Will follow up CBC  7. Hyperlipidemia Diet controlled, will follow up lab before next OV   Next appt: 4 months labs prior  Jessica K. [place],  [facility] [phone]

## 2018-07-13 LAB — COMPLETE METABOLIC PANEL WITH GFR
AG Ratio: 1.8 (calc) (ref 1.0–2.5)
ALBUMIN MSPROF: 4.4 g/dL (ref 3.6–5.1)
ALT: 34 U/L — ABNORMAL HIGH (ref 6–29)
AST: 34 U/L (ref 10–35)
Alkaline phosphatase (APISO): 56 U/L (ref 33–130)
BUN / CREAT RATIO: 14 (calc) (ref 6–22)
BUN: 17 mg/dL (ref 7–25)
CALCIUM: 9.5 mg/dL (ref 8.6–10.4)
CO2: 29 mmol/L (ref 20–32)
CREATININE: 1.19 mg/dL — AB (ref 0.60–0.93)
Chloride: 102 mmol/L (ref 98–110)
GFR, EST AFRICAN AMERICAN: 54 mL/min/{1.73_m2} — AB (ref >=60)
GFR, EST NON AFRICAN AMERICAN: 46 mL/min/{1.73_m2} — AB (ref >=60)
Globulin: 2.5 g/dL (calc) (ref 1.9–3.7)
Glucose, Bld: 88 mg/dL (ref 65–139)
Potassium: 3.8 mmol/L (ref 3.5–5.3)
SODIUM: 141 mmol/L (ref 135–146)
TOTAL PROTEIN: 6.9 g/dL (ref 6.1–8.1)
Total Bilirubin: 0.6 mg/dL (ref 0.2–1.2)

## 2018-07-13 LAB — CBC WITH DIFFERENTIAL/PLATELET
ABSOLUTE MONOCYTES: 669 {cells}/uL (ref 200–950)
BASOS ABS: 70 {cells}/uL (ref 0–200)
BASOS PCT: 0.8 %
EOS ABS: 202 {cells}/uL (ref 15–500)
Eosinophils Relative: 2.3 %
HCT: 41.7 % (ref 35.0–45.0)
Hemoglobin: 14.5 g/dL (ref 11.7–15.5)
Lymphs Abs: 2394 cells/uL (ref 850–3900)
MCH: 30.9 pg (ref 27.0–33.0)
MCHC: 34.8 g/dL (ref 32.0–36.0)
MCV: 88.7 fL (ref 80.0–100.0)
MONOS PCT: 7.6 %
MPV: 10.1 fL (ref 7.5–12.5)
NEUTROS ABS: 5465 {cells}/uL (ref 1500–7800)
Neutrophils Relative %: 62.1 %
PLATELETS: 205 10*3/uL (ref 140–400)
RBC: 4.7 10*6/uL (ref 3.80–5.10)
RDW: 13.3 % (ref 11.0–15.0)
TOTAL LYMPHOCYTE: 27.2 %
WBC: 8.8 10*3/uL (ref 3.8–10.8)

## 2018-07-20 ENCOUNTER — Emergency Department (HOSPITAL_COMMUNITY)
Admission: EM | Admit: 2018-07-20 | Discharge: 2018-07-21 | Disposition: A | Discharge status: Home / Self Care | Payer: Medicare Other | Attending: Emergency Medicine | Admitting: Emergency Medicine

## 2018-07-20 ENCOUNTER — Emergency Department (HOSPITAL_COMMUNITY): Payer: Medicare Other

## 2018-07-20 ENCOUNTER — Encounter (HOSPITAL_COMMUNITY): Payer: Self-pay | Admitting: Emergency Medicine

## 2018-07-20 DIAGNOSIS — Y999 Unspecified external cause status: Secondary | ICD-10-CM | POA: Insufficient documentation

## 2018-07-20 DIAGNOSIS — I10 Essential (primary) hypertension: Secondary | ICD-10-CM | POA: Insufficient documentation

## 2018-07-20 DIAGNOSIS — Z7982 Long term (current) use of aspirin: Secondary | ICD-10-CM | POA: Insufficient documentation

## 2018-07-20 DIAGNOSIS — S0990XA Unspecified injury of head, initial encounter: Secondary | ICD-10-CM | POA: Diagnosis not present

## 2018-07-20 DIAGNOSIS — Z944 Liver transplant status: Secondary | ICD-10-CM | POA: Diagnosis not present

## 2018-07-20 DIAGNOSIS — S0993XA Unspecified injury of face, initial encounter: Secondary | ICD-10-CM | POA: Diagnosis not present

## 2018-07-20 DIAGNOSIS — Y92510 Bank as the place of occurrence of the external cause: Secondary | ICD-10-CM | POA: Insufficient documentation

## 2018-07-20 DIAGNOSIS — Y9389 Activity, other specified: Secondary | ICD-10-CM | POA: Diagnosis not present

## 2018-07-20 DIAGNOSIS — W108XXA Fall (on) (from) other stairs and steps, initial encounter: Secondary | ICD-10-CM | POA: Insufficient documentation

## 2018-07-20 DIAGNOSIS — S022XXA Fracture of nasal bones, initial encounter for closed fracture: Secondary | ICD-10-CM

## 2018-07-20 DIAGNOSIS — R51 Headache: Secondary | ICD-10-CM | POA: Diagnosis not present

## 2018-07-20 DIAGNOSIS — R220 Localized swelling, mass and lump, head: Secondary | ICD-10-CM | POA: Diagnosis not present

## 2018-07-20 DIAGNOSIS — W19XXXA Unspecified fall, initial encounter: Secondary | ICD-10-CM | POA: Diagnosis not present

## 2018-07-20 DIAGNOSIS — R609 Edema, unspecified: Secondary | ICD-10-CM | POA: Diagnosis not present

## 2018-07-20 DIAGNOSIS — Z79899 Other long term (current) drug therapy: Secondary | ICD-10-CM | POA: Diagnosis not present

## 2018-07-20 NOTE — ED Notes (Signed)
Pt is alert and oriented x 4 and is verbally responsive. Pt denies pain at this time.  

## 2018-07-20 NOTE — ED Triage Notes (Signed)
Per [facility] pt from [facility] for fall down 2 stairs and hit face on ground. Pt not on blood thinners or LOC. Pt has swelling and face/noes pain. 124/78, 70HR, 18R, [id number]

## 2018-07-20 NOTE — ED Provider Notes (Signed)
[facility] DEPT Provider Note   CSN: [id number] Arrival date & time: 07/20/18  1830     History   Chief Complaint Chief Complaint  Patient presents with  . Fall  . Facial Injury    HPI Candace Hill is a 73 y.o. female past medical history of CRV O, hepatitis C, liver transplant who presents for evaluation after mechanical fall that occurred approxi-5:30 PM this evening.  Patient reports that she was walking out of a bank and states she thinks she slipped off the steps.  She states that she did not see the steps and the next week she remembers was falling down steps landing face first.  She denies any preceding chest pain, dizziness.  She states she did not have any LOC.  She was able to get up immediately after the incident.  Patient reports he has been amatory since then.  On ED arrival, patient reports pain to her nose, face.  Patient reports that on initial incident, she started having bilateral nosebleeds.  She states she takes aspirin but no other blood thinners.  Patient states that she has not had any vision changes, neck pain, back pain, nausea/vomiting, chest pain, difficulty breathing, numbness/weakness of arms or legs.   The history is provided by the patient.    Past Medical History:  Diagnosis Date  . Abnormal uterine bleeding   . Acid reflux   . Asthma    [phone] due to black mold house, no asthma now  . Broken ankle    right  . Broken rib    x2  . Cancer ([place])    liver (in the one that was removed)  . Central retinal vein occlusion few yrs   left eye retinal occlusion  . Complication of anesthesia    IV phenergan  serious mental reaction  . Depression   . Hepatitis C    HEP C  . Hernia, femoral    left  . History of blood transfusion 1971   acquired hepatitis c  . History of kidney stones   . History of renal stone   . History of shingles   . Hypertension    due to profraf  . Kidney stones    x 4 per new patient  packet   . Renal stone 11/2012  . Seasonal affective disorder ([place])   . 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 Tailbone injury age 13 and age 11 and few weeks ago   broken x3  . TIA (transient ischemic attack)   . 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 Asbestos exposure 04/08/2018  . Hyperglycemia 04/08/2018  . High risk medication use 04/08/2018  . Vitamin D deficiency 04/08/2018  . Weight loss 04/08/2018  . Essential hypertension 10/14/2017  . Gastroesophageal reflux disease 10/14/2017  . History of hepatitis C 10/14/2017  . History of nephrolithiasis 10/14/2017  . Ureteral stone 08/14/2016  . Migraine 07/02/2015  . Overweight (BMI 25.0-29.9) 06/28/2015  . Other chronic nonalcoholic liver disease 53/64/6803  . Malignant neoplasm of liver, not specified as primary or secondary ([place]) 11/14/2012  . Seasonal affective disorder ([place]) 11/14/2012  . Allergic rhinitis, cause unspecified 11/14/2012  . Chronic hepatitis, unspecified ([place]) 11/14/2012  . Central vein occlusion of retina 11/14/2012  . Liver transplanted ([place]) 11/14/2012    Past Surgical History:  Procedure Laterality Date  . CATARACT EXTRACTION     bilateral  . CHOLECYSTECTOMY  1997  . CYSTOSCOPY W/ URETERAL STENT PLACEMENT Left 08/14/2016   Procedure: CYSTOSCOPY WITH RETROGRADE PYELOGRAM/URETERAL LEFT URETEROSCOPY AND LEFT STENT PLACEMENT;  Surgeon: Raynelle Bring, MD;  Location: [facility];  Service: Urology;  Laterality: Left;  . CYSTOSCOPY/URETEROSCOPY/HOLMIUM LASER/STENT PLACEMENT Left 07/06/2016   Procedure: CYSTOSCOPY/URETEROSCOPY/ BILATERAL RETROGRADE/HOLMIUM LASER/STENT PLACEMENT/BASKET STONE REMOVAL;  Surgeon: Raynelle Bring, MD;  Location: [facility];  Service: Urology;  Laterality: Left;  . CYSTOSCOPY/URETEROSCOPY/HOLMIUM LASER/STENT PLACEMENT Left 08/06/2016   Procedure:  CYSTOSCOPY/URETEROSCOPY/HOLMIUM LASER/STENT PLACEMENT;  Surgeon: Raynelle Bring, MD;  Location: [facility];  Service: Urology;  Laterality: Left;  . CYSTOSCOPY/URETEROSCOPY/HOLMIUM LASER/STENT PLACEMENT Left 08/31/2016   Procedure: CYSTOSCOPY/URETEROSCOPY/HOLMIUM LASER/STENT PLACEMENT/BASKET STONE REMOVAL;  Surgeon: Raynelle Bring, MD;  Location: [facility];  Service: Urology;  Laterality: Left;  . FEMORAL HERNIA REPAIR  1997   left leg  . NASAL SINUS SURGERY  1997   x3  . TONSILLECTOMY  age 53  . 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 TUBAL LIGATION       OB History    Gravida  2   Para  2   Term  2   Preterm      AB      Living  2     SAB      TAB      Ectopic      Multiple      Live Births               Home Medications    Prior to Admission medications   Medication Sig Start Date End Date Taking? Authorizing Provider  tacrolimus (PROGRAF) 0.5 MG capsule Take 1.5 mg by mouth at bedtime. Take 3 by mouth at bedtime   Yes [provider]  acetaminophen (TYLENOL) 500 MG tablet Take 500-1,000 mg by mouth every 6 (six) hours as needed for mild pain, moderate pain or headache.     [provider]  aspirin EC 81 MG tablet Take 81 mg by mouth daily.    [provider]  brimonidine (ALPHAGAN P) 0.1 % SOLN 1 drop daily.    [provider]  DULoxetine (CYMBALTA) 30 MG capsule Take 90 mg by mouth daily.    [provider]  Ferrous Sulfate (IRON) 325 (65 Fe) MG TABS Take 1 tablet by mouth daily.    [provider]  folic acid (FOLVITE) 829 MCG tablet Take 400 mcg by mouth daily.    [provider]  hydrOXYzine (ATARAX/VISTARIL) 25 MG tablet Take 25 mg by mouth every 6 (six) hours as needed for itching.    [provider]  potassium chloride (K-DUR,KLOR-CON) 10 MEQ tablet Take 10 mEq by mouth daily.    [provider]  pyridoxine (B-6) 100 MG tablet Take 100 mg by mouth daily.    [provider]     Family History Family History  Adopted: Yes  Problem Relation Age of Onset  . Stroke Mother   . Alzheimer's disease Mother   . Cancer Mother   . Diabetes Mother        Questionable, per new patient packet   . Arthritis Mother        Questionable, per new patient packet   . Alcoholism Father   . COPD Brother        Heavy smoker, per new patient packet   . Suicidality Sister   . Depression Sister   .  COPD Sister   . OCD Daughter   . Personality disorder Daughter   . Alcoholism Son     Social History Social History   Tobacco Use  . Smoking status: Never Smoker  . Smokeless tobacco: Never Used  Substance Use Topics  . Alcohol use: No  . Drug use: No     Allergies   Codeine; Other; Penicillins; Phenergan [promethazine]; Sulfa antibiotics; Tamsulosin; and Lidocaine   Review of Systems Review of Systems  Constitutional: Negative for fever.  HENT: Positive for facial swelling and nosebleeds.   Eyes: Negative for visual disturbance.  Respiratory: Negative for cough and shortness of breath.   Cardiovascular: Negative for chest pain.  Gastrointestinal: Negative for abdominal pain, nausea and vomiting.  Genitourinary: Negative for dysuria and hematuria.  Musculoskeletal: Negative for back pain and neck pain.  Neurological: Negative for weakness, numbness and headaches.  All other systems reviewed and are negative.    Physical Exam Updated Vital Signs BP (!) 155/128 (BP Location: Right Arm)   Pulse 76   Temp 98.4 F (36.9 C) (Oral)   Resp 20   Ht 5\' 3"  (1.6 m)   Wt 65.8 kg   SpO2 97%   BMI 25.69 kg/m   Physical Exam Vitals signs and nursing note reviewed.  Constitutional:      Appearance: Normal appearance. She is well-developed.  HENT:     Head: Normocephalic and atraumatic.     Comments: No tenderness to palpation of skull. No deformities or crepitus noted. No open wounds, abrasions or lacerations.     Nose: Nasal tenderness present.     Right  Nostril: No epistaxis or septal hematoma.     Left Nostril: No epistaxis or septal hematoma.      Comments: Dried blood noted to bilateral naris.  No septal hematoma noted.  Patient with diffuse edema, tenderness noted to the upper bridge of the nose with overlying ecchymosis that extends just to the medial periorbital region.  No tenderness palpation noted to mandible or maxilla.  Elevation/depression and lateral movement of mandible intact without difficulty. Eyes:     General: Lids are normal.     Extraocular Movements: Extraocular movements intact.     Conjunctiva/sclera: Conjunctivae normal.     Pupils: Pupils are equal, round, and reactive to light.     Comments: EOMs intact without any difficulty.  Tenderness palpation noted to bilateral periorbital regions. PERRL.   Neck:     Musculoskeletal: Full passive range of motion without pain.     Comments: Full flexion/extension and lateral movement of neck fully intact. No bony midline tenderness. No deformities or crepitus.  Cardiovascular:     Rate and Rhythm: Normal rate and regular rhythm.     Pulses: Normal pulses.     Heart sounds: Normal heart sounds. No murmur. No friction rub. No gallop.   Pulmonary:     Effort: Pulmonary effort is normal.     Breath sounds: Normal breath sounds.  Abdominal:     Palpations: Abdomen is soft. Abdomen is not rigid.     Tenderness: There is no abdominal tenderness. There is no guarding.  Musculoskeletal: Normal range of motion.     Thoracic back: She exhibits no tenderness.     Lumbar back: She exhibits no tenderness.  Skin:    General: Skin is warm and dry.     Capillary Refill: Capillary refill takes less than 2 seconds.  Neurological:     Mental Status: She is alert and oriented  to person, place, and time.     Comments: Cranial nerves III-XII intact Follows commands, Moves all extremities  5/5 strength to BUE and BLE  Sensation intact throughout all major nerve distributions Normal  coordination No pronator drift. No gait abnormalities  No slurred speech. No facial droop.   Psychiatric:        Speech: Speech normal.      ED Treatments / Results  Labs (all labs ordered are listed, but only abnormal results are displayed) Labs Reviewed - No data to display  EKG None  Radiology Ct Head Wo Contrast  Result Date: 07/20/2018 CLINICAL DATA:  73 year old female status post fall down stairs. Face and nose swelling and pain. EXAM: CT HEAD WITHOUT CONTRAST CT MAXILLOFACIAL WITHOUT CONTRAST TECHNIQUE: Multidetector CT imaging of the head and maxillofacial structures were performed using the standard protocol without intravenous contrast. Multiplanar CT image reconstructions of the maxillofacial structures were also generated. COMPARISON:  Brain MRI 01/15/2015. Head CT 12/27/2012. FINDINGS: CT HEAD FINDINGS Brain: Cerebral volume is stable and normal for age. No midline shift, ventriculomegaly, mass effect, evidence of mass lesion, intracranial hemorrhage or evidence of cortically based acute infarction. Mild for age white matter hypodensity, most pronounced in the periatrial white matter, appears similar to the 2016 MRI. No cortical encephalomalacia. Vascular: Calcified atherosclerosis at the skull base. No suspicious intracranial vascular hyperdensity. Skull: Stable and intact. Other: No scalp hematoma identified. CT MAXILLOFACIAL FINDINGS Osseous: Mandible intact. No maxilla or zygoma fracture. Possible minimally displaced acute left nasal bone fracture on series 9, image 29. Mild if any overlying nasal soft tissue swelling. No soft tissue gas. Central skull base and visible cervical spine appear intact. Orbits: Intact bilateral orbital walls. Postoperative changes to the globes but otherwise negative orbits soft tissues. Sinuses: Chronic maxillary sinus mucoperiosteal thickening. But paranasal sinuses and mastoids are stable and well pneumatized. Tympanic cavities are clear. Soft  tissues: Negative visible noncontrast thyroid, larynx, pharynx, parapharyngeal spaces, retropharyngeal space, sublingual space, submandibular spaces, masticator spaces, parotid spaces. No superficial soft tissue gas or hematoma identified. Upper cervical lymph nodes are diminutive and normal. IMPRESSION: 1. Possible minimally displaced left nasal bone fracture. 2. No other acute traumatic injury identified in the head or face. 3. Stable and largely normal for age noncontrast CT appearance of the brain. Electronically Signed   By: Genevie Ann M.D.   On: 07/20/2018 21:56   Ct Maxillofacial Wo Contrast  Result Date: 07/20/2018 CLINICAL DATA:  74 year old female status post fall down stairs. Face and nose swelling and pain. EXAM: CT HEAD WITHOUT CONTRAST CT MAXILLOFACIAL WITHOUT CONTRAST TECHNIQUE: Multidetector CT imaging of the head and maxillofacial structures were performed using the standard protocol without intravenous contrast. Multiplanar CT image reconstructions of the maxillofacial structures were also generated. COMPARISON:  Brain MRI 01/15/2015. Head CT 12/27/2012. FINDINGS: CT HEAD FINDINGS Brain: Cerebral volume is stable and normal for age. No midline shift, ventriculomegaly, mass effect, evidence of mass lesion, intracranial hemorrhage or evidence of cortically based acute infarction. Mild for age white matter hypodensity, most pronounced in the periatrial white matter, appears similar to the 2016 MRI. No cortical encephalomalacia. Vascular: Calcified atherosclerosis at the skull base. No suspicious intracranial vascular hyperdensity. Skull: Stable and intact. Other: No scalp hematoma identified. CT MAXILLOFACIAL FINDINGS Osseous: Mandible intact. No maxilla or zygoma fracture. Possible minimally displaced acute left nasal bone fracture on series 9, image 29. Mild if any overlying nasal soft tissue swelling. No soft tissue gas. Central skull base and visible cervical spine  appear intact. Orbits: Intact  bilateral orbital walls. Postoperative changes to the globes but otherwise negative orbits soft tissues. Sinuses: Chronic maxillary sinus mucoperiosteal thickening. But paranasal sinuses and mastoids are stable and well pneumatized. Tympanic cavities are clear. Soft tissues: Negative visible noncontrast thyroid, larynx, pharynx, parapharyngeal spaces, retropharyngeal space, sublingual space, submandibular spaces, masticator spaces, parotid spaces. No superficial soft tissue gas or hematoma identified. Upper cervical lymph nodes are diminutive and normal. IMPRESSION: 1. Possible minimally displaced left nasal bone fracture. 2. No other acute traumatic injury identified in the head or face. 3. Stable and largely normal for age noncontrast CT appearance of the brain. Electronically Signed   By: Genevie Ann M.D.   On: 07/20/2018 21:56    Procedures Procedures (including critical care time)  Medications Ordered in ED Medications - No data to display   Initial Impression / Assessment and Plan / ED Course  I have reviewed the triage vital signs and the nursing notes.  Pertinent labs & imaging results that were available during my care of the patient were reviewed by me and considered in my medical decision making (see chart for details).     73 year old female who presents for evaluation after mechanical fall that occurred approxi-5:30 PM.  She reports she was walking [place] the [place] and states that they were wet.  She thinks she may have slipped on them or did not see them.  She denies any preceding chest pain, dizziness.  She reports she landed face first.  She denies any LOC.  She reports she is on aspirin no other blood thinners.  She reports pain to the nose.  She reports initially, she had epistaxis but that is since improved.  No chest pain, difficulty breathing, dizziness, numbness/weakness, nausea/vomiting. Patient is afebrile, non-toxic appearing, sitting comfortably on examination table.  Vital signs reviewed and stable.  No neuro deficits noted on exam.  Patient with pain and swelling noted to the nasal bridge and extend just the medial periorbital region though no tenderness noted to the periorbital region.  Question nasal fracture.  Additionally, patient does report that she hit her head quite significantly and does report she is on aspirin.  Will plan for CT head for evaluation of possible intracranial abnormality as well as CT max of facial for any possible nasal fracture.  CT head negative for any acute abnormalities.  CT maxillofacial shows nondisplaced left nasal bone fracture.  No other acute abnormality noted.  Discussed results with patient.  Encourage at home supportive care measures.  Patient given outpatient ENT referral for further evaluation. At this time, patient exhibits no emergent life-threatening condition that require further evaluation in ED or admission. Patient had ample opportunity for questions and discussion. All patient's questions were answered with full understanding. Strict return precautions discussed. Patient expresses understanding and agreement to plan.   Portions of this note were generated with [employer]. Dictation errors may occur despite best attempts at proofreading.   Final Clinical Impressions(s) / ED Diagnoses   Final diagnoses:  Closed fracture of nasal bone, initial encounter    ED Discharge Orders    None       Desma Mcgregor 07/20/18 2333    Isla Pence, MD 07/21/18 1512

## 2018-07-20 NOTE — Discharge Instructions (Signed)
You can take Tylenol or Ibuprofen as directed for pain. You can alternate Tylenol and Ibuprofen every 4 hours. If you take Tylenol at 1pm, then you can take Ibuprofen at 5pm. Then you can take Tylenol again at 9pm.   As we discussed, you can apply ice to help with pain and swelling.  You should not be blowing your nose.  Follow-up with referred ENT.  Return the emergency department for any worsening pain, persistent bleeding, numbness/weakness to arms or legs, chest pain, difficulty breathing or any other worsening or concerning symptoms.

## 2018-07-22 DIAGNOSIS — F331 Major depressive disorder, recurrent, moderate: Secondary | ICD-10-CM | POA: Diagnosis not present

## 2018-07-22 DIAGNOSIS — F411 Generalized anxiety disorder: Secondary | ICD-10-CM | POA: Diagnosis not present

## 2018-07-26 DIAGNOSIS — S022XXD Fracture of nasal bones, subsequent encounter for fracture with routine healing: Secondary | ICD-10-CM | POA: Diagnosis not present

## 2018-08-23 DIAGNOSIS — F331 Major depressive disorder, recurrent, moderate: Secondary | ICD-10-CM | POA: Diagnosis not present

## 2018-08-23 DIAGNOSIS — F411 Generalized anxiety disorder: Secondary | ICD-10-CM | POA: Diagnosis not present

## 2018-08-24 DIAGNOSIS — Z5181 Encounter for therapeutic drug level monitoring: Principal | ICD-10-CM

## 2018-08-24 DIAGNOSIS — Z944 Liver transplant status: Principal | ICD-10-CM

## 2018-08-24 DIAGNOSIS — E612 Magnesium deficiency: Principal | ICD-10-CM

## 2018-08-25 ENCOUNTER — Telehealth: Payer: Self-pay

## 2018-08-25 NOTE — Telephone Encounter (Signed)
Patient called requesting referral to Neurologist  Patient had a fall in [place] and states she feels that something is off.  I offered patient an appointment and she refused and stated she does not feel like going through hoops.   Please advise

## 2018-08-25 NOTE — Telephone Encounter (Signed)
She will need an appt for any referral

## 2018-08-26 NOTE — Telephone Encounter (Signed)
Spoke with patient, patient was not fully awake at the time of call  Patient plans to call back to schedule an appointment

## 2018-08-29 DIAGNOSIS — Z5181 Encounter for therapeutic drug level monitoring: Principal | ICD-10-CM

## 2018-08-29 DIAGNOSIS — E612 Magnesium deficiency: Principal | ICD-10-CM

## 2018-08-29 DIAGNOSIS — Z944 Liver transplant status: Principal | ICD-10-CM

## 2018-09-05 DIAGNOSIS — Z5181 Encounter for therapeutic drug level monitoring: Principal | ICD-10-CM

## 2018-09-05 DIAGNOSIS — E612 Magnesium deficiency: Principal | ICD-10-CM

## 2018-09-05 DIAGNOSIS — Z944 Liver transplant status: Principal | ICD-10-CM

## 2018-09-09 DIAGNOSIS — Z944 Liver transplant status: Principal | ICD-10-CM

## 2018-09-09 DIAGNOSIS — Z79899 Other long term (current) drug therapy: Principal | ICD-10-CM

## 2018-09-09 MED ORDER — PROGRAF 0.5 MG CAPSULE
ORAL_CAPSULE | Freq: Two times a day (BID) | ORAL | 2 refills | 0 days | Status: CP
Start: 2018-09-09 — End: 2018-11-28

## 2018-09-12 DIAGNOSIS — Z944 Liver transplant status: Secondary | ICD-10-CM | POA: Diagnosis not present

## 2018-09-12 DIAGNOSIS — Z5181 Encounter for therapeutic drug level monitoring: Secondary | ICD-10-CM | POA: Diagnosis not present

## 2018-09-12 DIAGNOSIS — E612 Magnesium deficiency: Principal | ICD-10-CM

## 2018-11-08 ENCOUNTER — Other Ambulatory Visit: Payer: Self-pay

## 2018-11-08 ENCOUNTER — Other Ambulatory Visit: Payer: Medicare Other

## 2018-11-08 DIAGNOSIS — E782 Mixed hyperlipidemia: Secondary | ICD-10-CM | POA: Diagnosis not present

## 2018-11-08 DIAGNOSIS — Z944 Liver transplant status: Secondary | ICD-10-CM

## 2018-11-08 DIAGNOSIS — D509 Iron deficiency anemia, unspecified: Secondary | ICD-10-CM | POA: Diagnosis not present

## 2018-11-08 LAB — COMPLETE METABOLIC PANEL WITH GFR
AG Ratio: 1.7 (calc) (ref 1.0–2.5)
ALT: 14 U/L (ref 6–29)
AST: 20 U/L (ref 10–35)
Albumin: 4.6 g/dL (ref 3.6–5.1)
Alkaline phosphatase (APISO): 57 U/L (ref 37–153)
BUN/Creatinine Ratio: 14 (calc) (ref 6–22)
BUN: 15 mg/dL (ref 7–25)
CO2: 31 mmol/L (ref 20–32)
Calcium: 10.2 mg/dL (ref 8.6–10.4)
Chloride: 102 mmol/L (ref 98–110)
Creat: 1.06 mg/dL — ABNORMAL HIGH (ref 0.60–0.93)
GFR, Est African American: 61 mL/min/{1.73_m2} (ref >=60)
GFR, Est Non African American: 53 mL/min/{1.73_m2} — ABNORMAL LOW (ref >=60)
Globulin: 2.7 g/dL (calc) (ref 1.9–3.7)
Glucose, Bld: 108 mg/dL — ABNORMAL HIGH (ref 65–99)
Potassium: 4.4 mmol/L (ref 3.5–5.3)
Sodium: 141 mmol/L (ref 135–146)
Total Bilirubin: 0.6 mg/dL (ref 0.2–1.2)
Total Protein: 7.3 g/dL (ref 6.1–8.1)

## 2018-11-08 LAB — CBC WITH DIFFERENTIAL/PLATELET
Absolute Monocytes: 641 cells/uL (ref 200–950)
Basophils Absolute: 53 cells/uL (ref 0–200)
Basophils Relative: 0.6 %
Eosinophils Absolute: 214 cells/uL (ref 15–500)
Eosinophils Relative: 2.4 %
HCT: 44.3 % (ref 35.0–45.0)
Hemoglobin: 15.2 g/dL (ref 11.7–15.5)
Lymphs Abs: 2519 cells/uL (ref 850–3900)
MCH: 30.6 pg (ref 27.0–33.0)
MCHC: 34.3 g/dL (ref 32.0–36.0)
MCV: 89.1 fL (ref 80.0–100.0)
MPV: 10.1 fL (ref 7.5–12.5)
Monocytes Relative: 7.2 %
Neutro Abs: 5474 cells/uL (ref 1500–7800)
Neutrophils Relative %: 61.5 %
Platelets: 216 10*3/uL (ref 140–400)
RBC: 4.97 10*6/uL (ref 3.80–5.10)
RDW: 13.7 % (ref 11.0–15.0)
Total Lymphocyte: 28.3 %
WBC: 8.9 10*3/uL (ref 3.8–10.8)

## 2018-11-08 LAB — LIPID PANEL
Cholesterol: 236 mg/dL — ABNORMAL HIGH (ref <200)
HDL: 62 mg/dL (ref >=50)
LDL Cholesterol (Calc): 156 mg/dL (calc) — ABNORMAL HIGH
Non-HDL Cholesterol (Calc): 174 mg/dL (calc) — ABNORMAL HIGH (ref <130)
Total CHOL/HDL Ratio: 3.8 (calc) (ref <5.0)
Triglycerides: 78 mg/dL (ref <150)

## 2018-11-10 ENCOUNTER — Other Ambulatory Visit: Payer: Self-pay

## 2018-11-10 ENCOUNTER — Ambulatory Visit: Payer: Medicare Other | Admitting: Nurse Practitioner

## 2018-11-14 ENCOUNTER — Other Ambulatory Visit: Payer: Self-pay

## 2018-11-14 ENCOUNTER — Ambulatory Visit (INDEPENDENT_AMBULATORY_CARE_PROVIDER_SITE_OTHER): Payer: Medicare Other | Admitting: Nurse Practitioner

## 2018-11-14 DIAGNOSIS — E782 Mixed hyperlipidemia: Secondary | ICD-10-CM

## 2018-11-14 DIAGNOSIS — R739 Hyperglycemia, unspecified: Secondary | ICD-10-CM | POA: Diagnosis not present

## 2018-11-14 DIAGNOSIS — I10 Essential (primary) hypertension: Secondary | ICD-10-CM

## 2018-11-14 DIAGNOSIS — F339 Major depressive disorder, recurrent, unspecified: Secondary | ICD-10-CM

## 2018-11-14 DIAGNOSIS — Z944 Liver transplant status: Secondary | ICD-10-CM | POA: Diagnosis not present

## 2018-11-14 DIAGNOSIS — K219 Gastro-esophageal reflux disease without esophagitis: Secondary | ICD-10-CM | POA: Diagnosis not present

## 2018-11-14 DIAGNOSIS — F419 Anxiety disorder, unspecified: Secondary | ICD-10-CM

## 2018-11-14 DIAGNOSIS — N201 Calculus of ureter: Secondary | ICD-10-CM

## 2018-11-14 DIAGNOSIS — D509 Iron deficiency anemia, unspecified: Secondary | ICD-10-CM

## 2018-11-14 MED ORDER — ROSUVASTATIN CALCIUM 5 MG PO TABS: 2.5000 mg | ORAL_TABLET | Freq: Every day | ORAL | 2 refills | Status: DC

## 2018-11-14 MED ORDER — DULOXETINE HCL 60 MG PO CPEP: 60.0000 mg | ORAL_CAPSULE | Freq: Every day | ORAL | 1 refills | Status: DC

## 2018-11-14 NOTE — Progress Notes (Signed)
This service is provided via telemedicine  No vital signs collected/recorded due to the encounter was a telemedicine visit.   Location of patient (ex: home, work): Home  Patient consents to a telephone visit:  Yes  Location of the provider (ex: office, home): [facility], Office   Name of any referring provider:  N/A  Names of all persons participating in the telemedicine service and their role in the encounter:  S.Chrae B/CMA, Sherrie Mustache, NP, and Patient   Time spent on call:  16  min with medical assistant      Careteam: Patient Care Team: Lauree Chandler, NP as PCP - General (Geriatric Medicine) Rolm Bookbinder, MD as Consulting Physician (Dermatology) Gerarda Fraction, MD as Referring Physician (Ophthalmology) Raynelle Bring, MD as Consulting Physician (Urology)  Advanced Directive information Does Patient Have a Medical Advance Directive?: No, Does patient want to make changes to medical advance directive?: Yes (MAU/Ambulatory/Procedural Areas - Information given)  Allergies  Allergen Reactions  . Codeine Nausea Only  . 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    Marland Kitchen Penicillins Nausea Only and Other (See Comments)    Has patient had a PCN reaction causing immediate rash, facial/tongue/throat swelling, SOB or lightheadedness with hypotension: No Has patient had a PCN reaction causing severe rash involving mucus membranes or skin necrosis: No Has patient had a PCN reaction that required hospitalization No Has patient had a PCN reaction occurring within the last 10 years: No If all of the above answers are "NO", then may proceed with Cephalosporin use.  Marland Kitchen Phenergan [Promethazine] Other (See Comments)    Reaction:  Hallucinations  Pt states that she is only allergic to IV form.   . Sulfa Antibiotics Itching  . Tamsulosin Itching  . Lidocaine Palpitations    Chief Complaint  Patient presents with  . Medical  Management of Chronic Issues    4 month follow-up and discuss labs (copy printed and to be mailed with AVS). Moderate fall risk.Telephone visit   . Medication Management    Discuss Cymbalta. When patient ran out of 30 mg's and started the 60's and only took 1 pill. Patient didnt know how much cymbalta she is suppose to take.   . Immunizations    Discuss need for TDaP   . Referral    Requesting referral/recommendation for a counslor.      HPI: Patient is a 73 y.o. female for routine follow up. Had to put her dog to sleep 2 days ago after he was attacked by a much larger dog. Gotten bitten twice on his head and neck. Continues to be shaken up over this.   Had a fall after she left the bank. Slipped on the steps when someone was helping her. Feel on her face, broke her nose. Also cracked her 2 front teeth. Followed up with ENT.    Anxiety/depression/Seasonal affective d/o- Feels like fall has really effected her, states she is now weak and moving slower. Feels like her brain is moving slower. Also had to move twice which was stressful. Had a bad experience with a counselor. Upset because she would never tell her how much she would charge and she just asked about her and now give her any counseling.   taking Cymbalta 60 mg by mouth daily- anxiety and depression worse at [place]. Previously following with psychology- recently started with a new one bu does not feel like its beneficial. Had a  hard time around the holidays.  Hx hepatic cirrhosis/chronic Hep C hx - s/p livertransplant9/09/2007. Followed by [facility] transplant team Dr Lemmie Evens. Currently takes prograf. She was tx and cured Hepatitis C by [facility]. LFTs remained stable. Hep C contracted following blood transfusion after SVD in 1950s but was not dx until 1997.has not followed up recently   Glaucoma/CRVO OS - tx with eye gtts. She is legally blind inleft eye. She is followed by [facility] Dr Cordelia Pen (ophthamology)  HTN-off medication, blood  pressure stable.She also takes ASA daily  Hx nephrolithiasis - followed by [facility] Dr Alinda Money. She has had stents and lithotripsy in the past. Recent UA revealed microscopic RBCs.Taking potassium due to this.  Hx Vit D deficiency -she was takingweekly Vit D 50K units but is no longer on it.Not currently taking vit D supplement  GERD - stable, not on omeprazole at this time. Diet modifications.  Hx anemia - stable on iron and folate; Hgb 15.2  TIA (dx in 2016)/hx HA/excessive daytime sleepiness - followed by neurology Dr Rexene Alberts. She was to go for sleep study to r/o OSA but could not complete study. She has taken maxalt prn HA-- ongoing "fog" got upset with the people who did the test. States she was unable to complete the test due to multiple reason.   She is adopted but is aware of birthmother's med hx including Alzheimer's, stroke, DM and HTN   Review of Systems:  Review of Systems  Constitutional: Positive for malaise/fatigue. Negative for chills, fever and weight loss.  HENT: Negative for tinnitus.   Respiratory: Negative for cough, sputum production and shortness of breath.   Cardiovascular: Negative for chest pain, palpitations and leg swelling.  Gastrointestinal: Negative for abdominal pain, constipation, diarrhea and heartburn.  Genitourinary: Negative for dysuria, frequency and urgency.  Musculoskeletal: Negative for back pain, falls, joint pain and myalgias.  Skin: Negative.   Neurological: Negative for dizziness and headaches.  Psychiatric/Behavioral: Positive for depression. Negative for memory loss. The patient is nervous/anxious. The patient does not have insomnia.     Past Medical History:  Diagnosis Date  . Abnormal uterine bleeding   . Acid reflux   . Asthma    [phone] due to black mold house, no asthma now  . Broken ankle    right  . Broken rib    x2  . Cancer ([place])    liver (in the one that was removed)  . Central retinal vein  occlusion few yrs   left eye retinal occlusion  . Complication of anesthesia    IV phenergan  serious mental reaction  . Depression   . Hepatitis C    HEP C  . Hernia, femoral    left  . History of blood transfusion 1971   acquired hepatitis c  . History of kidney stones   . History of renal stone   . History of shingles   . Hypertension    due to profraf  . Kidney stones    x 4 per new patient packet   . Renal stone 11/2012  . Seasonal affective disorder ([place])   . Stroke [facility])    TIA x2 caused by prednisone  . Tailbone injury age 6 and age 78 and few weeks ago   broken x3  . TIA (transient ischemic attack)   . 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 CATARACT EXTRACTION  bilateral  . CHOLECYSTECTOMY  1997  . CYSTOSCOPY W/ URETERAL STENT PLACEMENT Left 08/14/2016   Procedure: CYSTOSCOPY WITH RETROGRADE PYELOGRAM/URETERAL LEFT URETEROSCOPY AND LEFT STENT PLACEMENT;  Surgeon: Raynelle Bring, MD;  Location: [facility];  Service: Urology;  Laterality: Left;  . CYSTOSCOPY/URETEROSCOPY/HOLMIUM LASER/STENT PLACEMENT Left 07/06/2016   Procedure: CYSTOSCOPY/URETEROSCOPY/ BILATERAL RETROGRADE/HOLMIUM LASER/STENT PLACEMENT/BASKET STONE REMOVAL;  Surgeon: Raynelle Bring, MD;  Location: [facility];  Service: Urology;  Laterality: Left;  . CYSTOSCOPY/URETEROSCOPY/HOLMIUM LASER/STENT PLACEMENT Left 08/06/2016   Procedure: CYSTOSCOPY/URETEROSCOPY/HOLMIUM LASER/STENT PLACEMENT;  Surgeon: Raynelle Bring, MD;  Location: [facility];  Service: Urology;  Laterality: Left;  . CYSTOSCOPY/URETEROSCOPY/HOLMIUM LASER/STENT PLACEMENT Left 08/31/2016   Procedure: CYSTOSCOPY/URETEROSCOPY/HOLMIUM LASER/STENT PLACEMENT/BASKET STONE REMOVAL;  Surgeon: Raynelle Bring, MD;  Location: [facility];  Service: Urology;  Laterality: Left;  . FEMORAL HERNIA REPAIR  1997   left leg  . NASAL SINUS SURGERY  1997   x3  .  TONSILLECTOMY  age 43  . 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 TUBAL LIGATION     Social History:   reports that she has never smoked. She has never used smokeless tobacco. She reports that she does not drink alcohol or use drugs.  Family History  Adopted: Yes  Problem Relation Age of Onset  . Stroke Mother   . Alzheimer's disease Mother   . Cancer Mother   . Diabetes Mother        Questionable, per new patient packet   . Arthritis Mother        Questionable, per new patient packet   . Alcoholism Father   . COPD Brother        Heavy smoker, per new patient packet   . Suicidality Sister   . Depression Sister   . COPD Sister   . OCD Daughter   . Personality disorder Daughter   . Alcoholism Son     Medications: Patient's Medications  New Prescriptions   No medications on file  Previous Medications   ACETAMINOPHEN (TYLENOL) 500 MG TABLET    Take 500-1,000 mg by mouth every 6 (six) hours as needed for mild pain, moderate pain or headache.    ASPIRIN EC 81 MG TABLET    Take 81 mg by mouth daily.   BRIMONIDINE (ALPHAGAN P) 0.1 % SOLN    Place 1 drop into both eyes daily.    DULOXETINE (CYMBALTA) 30 MG CAPSULE    Take 30 mg by mouth daily. Along with 60 mg to total 90 mg daily   DULOXETINE (CYMBALTA) 60 MG CAPSULE    Take 60 mg by mouth daily. Along with 30 mg to total 90 mg daily   FERROUS SULFATE (IRON) 325 (65 FE) MG TABS    Take 1 tablet by mouth daily.   FOLIC ACID (FOLVITE) 254 MCG TABLET    Take 400 mcg by mouth daily.   HYDROXYZINE (ATARAX/VISTARIL) 25 MG TABLET    Take 25 mg by mouth every 6 (six) hours as needed for itching.   POTASSIUM CHLORIDE (K-DUR,KLOR-CON) 10 MEQ TABLET    Take 10 mEq by mouth daily.   PYRIDOXINE (B-6) 100 MG TABLET    Take 100 mg by mouth daily.   TACROLIMUS (PROGRAF) 0.5 MG CAPSULE    Take 4 by mouth at bedtime  Modified Medications   No medications on file  Discontinued Medications   No medications on file    Physical Exam: unable due to televisi   There were no vitals filed for this visit. There is no  height or weight on file to calculate BMI. Wt Readings from Last 3 Encounters:  07/20/18 145 lb (65.8 kg)  07/12/18 146 lb (66.2 kg)  06/14/18 144 lb 12.8 oz (65.7 kg)      Labs reviewed: Basic Metabolic Panel: Recent Labs    12/20/17 1147 04/08/18 1143 07/12/18 1605 11/08/18 0836  NA 143 142 141 141  K 4.0 4.1 3.8 4.4  CL 103 103 102 102  CO2 29 31 29 31   GLUCOSE 118* 94 88 108*  BUN 14 12 17 15   CREATININE 1.10* 1.04* 1.19* 1.06*  CALCIUM 9.6 9.7 9.5 10.2  TSH 3.86  --   --   --    Liver Function Tests: Recent Labs    04/08/18 1143 07/12/18 1605 11/08/18 0836  AST 43* 34 20  ALT 61* 34* 14  BILITOT 0.7 0.6 0.6  PROT 6.9 6.9 7.3   No results for input(s): LIPASE, AMYLASE in the last 8760 hours. No results for input(s): AMMONIA in the last 8760 hours. CBC: Recent Labs    04/08/18 1143 07/12/18 1605 11/08/18 0836  WBC 8.0 8.8 8.9  NEUTROABS 5,288 5,465 5,474  HGB 14.6 14.5 15.2  HCT 42.3 41.7 44.3  MCV 90.0 88.7 89.1  PLT 200 205 216   Lipid Panel: Recent Labs    12/20/17 1147 11/08/18 0836  CHOL 197 236*  HDL 52 62  LDLCALC 126* 156*  TRIG 87 78  CHOLHDL 3.8 3.8   TSH: Recent Labs    12/20/17 1147  TSH 3.86   A1C: Lab Results  Component Value Date   HGBA1C 5.3 12/31/2017     Assessment/Plan 1. Depression, recurrent ([place]) Has titrated down to Cymbalta 60 mg by mouth daily. -no thoughts of HI or SI however depression and anxiety not controlled and worse since she had to put her dog down and since her fall. She was seeing counselor but did not work out. Information provided for other counseling services, also encouraged her to follow up with psychiatrist - DULoxetine (CYMBALTA) 60 MG capsule; Take 1 capsule (60 mg total) by mouth daily.  Dispense: 90 capsule; Refill: 1  2. Anxiety See number 1 - DULoxetine (CYMBALTA) 60 MG capsule; Take 1 capsule (60 mg total) by mouth daily.   Dispense: 90 capsule; Refill: 1  3. Essential hypertension Off all blood pressure medication, diet controlled. Goal BP <140/90.  4. Gastroesophageal reflux disease, esophagitis presence not specified Stable with diet modifications.   5. Mixed hyperlipidemia -not controlled. Diet has been poor, encouraged to work on diet, will start low dose crestor at this time as well and follow up lipids and CMP in 6 weeks.  - rosuvastatin (CRESTOR) 5 MG tablet; Take 0.5 tablets (2.5 mg total) by mouth daily.  Dispense: 30 tablet; Refill: 2 - COMPLETE METABOLIC PANEL WITH GFR; Future - Lipid Panel; Future  6. Hyperglycemia -discussed dietary modifications at this time.  - Hemoglobin A1c; Future  7. Liver transplanted ([place]) Continues on prograf, needs follow up with transplant team, encouraged her to call and make appt.   8. Ureteral stone Stable, Continues on potassium supplement.   9. Iron deficiency anemia, unspecified iron deficiency anemia type -hgb stable, reviewed with pt. Continues on iron   Next appt: 6 week lab, follow up 3 months.  Carlos American. Harle Battiest  [facility] [phone]    Virtual Visit via Telephone Note  I connected withpt on 11/14/18 at 12:00 PM EDT by telephone and verified  that I am speaking with the correct person using two identifiers.  Location: Patient: home Provider: office   I discussed the limitations, risks, security and privacy concerns of performing an evaluation and management service by telephone and the availability of in person appointments. I also discussed with the patient that there may be a patient responsible charge related to this service. The patient expressed understanding and agreed to proceed.   I discussed the assessment and treatment plan with the patient. The patient was provided an opportunity to ask questions and all were answered. The patient agreed with the plan and demonstrated an understanding of the  instructions.   The patient was advised to call back or seek an in-person evaluation if the symptoms worsen or if the condition fails to improve as anticipated.  I provided 32 minutes of non-face-to-face time during this encounter.  Carlos American. Harle Battiest Avs printed and mailed

## 2018-11-14 NOTE — Patient Instructions (Addendum)
To start crestor 2.5 mg by mouth daily for high cholesterol. Also important to work on diet.  Follow up in 6 weeks for blood work on this  Recommend follow up with Dr Rexene Alberts neurologist  Need follow up with Transplant team at [place]  To make appt with psychiatrist and psychologist.   Change diet, heart healthy and diabetic diet.  Increase physical as tolerates. 15-30 mins daily   Heart-Healthy Eating Plan Many factors influence your heart (coronary) health, including eating and exercise habits. Coronary risk increases with abnormal blood fat (lipid) levels. Heart-healthy meal planning includes limiting unhealthy fats, increasing healthy fats, and making other diet and lifestyle changes.  What are tips for following this plan? Cooking Cook foods using methods other than frying. Baking, boiling, grilling, and broiling are all good options. Other ways to reduce fat include:  Removing the skin from poultry.  Removing all visible fats from meats.  Steaming vegetables in water or broth. Meal planning   At meals, imagine dividing your plate into fourths: ? Fill one-half of your plate with vegetables and green salads. ? Fill one-fourth of your plate with whole grains. ? Fill one-fourth of your plate with lean protein foods.  Eat 4-5 servings of vegetables per day. One serving equals 1 cup raw or cooked vegetable, or 2 cups raw leafy greens.  Eat 4-5 servings of fruit per day. One serving equals 1 medium whole fruit,  cup dried fruit,  cup fresh, frozen, or canned fruit, or  cup 100% fruit juice.  Eat more foods that contain soluble fiber. Examples include apples, broccoli, carrots, beans, peas, and barley. Aim to get 25-30 g of fiber per day.  Increase your consumption of legumes, nuts, and seeds to 4-5 servings per week. One serving of dried beans or legumes equals  cup cooked, 1 serving of nuts is  cup, and 1 serving of seeds equals 1 tablespoon. Fats  Choose healthy fats  more often. Choose monounsaturated and polyunsaturated fats, such as olive and canola oils, flaxseeds, walnuts, almonds, and seeds.  Eat more omega-3 fats. Choose salmon, mackerel, sardines, tuna, flaxseed oil, and ground flaxseeds. Aim to eat fish at least 2 times each week.  Check food labels carefully to identify foods with trans fats or high amounts of saturated fat.  Limit saturated fats. These are found in animal products, such as meats, butter, and cream. Plant sources of saturated fats include palm oil, palm kernel oil, and coconut oil.  Avoid foods with partially hydrogenated oils in them. These contain trans fats. Examples are stick margarine, some tub margarines, cookies, crackers, and other baked goods.  Avoid fried foods. General information  Eat more home-cooked food and less restaurant, buffet, and fast food.  Limit or avoid alcohol.  Limit foods that are high in starch and sugar.  Lose weight if you are overweight. Losing just 5-10% of your body weight can help your overall health and prevent diseases such as diabetes and heart disease.  Monitor your salt (sodium) intake, especially if you have high blood pressure. Talk with your health care provider about your sodium intake.  Try to incorporate more vegetarian meals weekly. What foods can I eat? Fruits All fresh, canned (in natural juice), or frozen fruits. Vegetables Fresh or frozen vegetables (raw, steamed, roasted, or grilled). Green salads. Grains Most grains. Choose whole wheat and whole grains most of the time. Rice and pasta, including brown rice and pastas made with whole wheat. Meats and other proteins Lean, well-trimmed  beef, veal, pork, and lamb. Chicken and [place] without skin. All fish and shellfish. Wild duck, rabbit, pheasant, and venison. Egg whites or low-cholesterol egg substitutes. Dried beans, peas, lentils, and tofu. Seeds and most nuts. Dairy Low-fat or nonfat cheeses, including ricotta and  mozzarella. Skim or 1% milk (liquid, powdered, or evaporated). Buttermilk made with low-fat milk. Nonfat or low-fat yogurt. Fats and oils Non-hydrogenated (trans-free) margarines. Vegetable oils, including soybean, sesame, sunflower, olive, peanut, safflower, corn, canola, and cottonseed. Salad dressings or mayonnaise made with a vegetable oil. Beverages Water (mineral or sparkling). Coffee and tea. Diet carbonated beverages. Sweets and desserts Sherbet, gelatin, and fruit ice. Small amounts of dark chocolate. Limit all sweets and desserts. Seasonings and condiments All seasonings and condiments. The items listed above may not be a complete list of foods and beverages you can eat. Contact a dietitian for more options. What foods are not recommended? Fruits Canned fruit in heavy syrup. Fruit in cream or butter sauce. Fried fruit. Limit coconut. Vegetables Vegetables cooked in cheese, cream, or butter sauce. Fried vegetables. Grains Breads made with saturated or trans fats, oils, or whole milk. Croissants. Sweet rolls. Donuts. High-fat crackers, such as cheese crackers. Meats and other proteins Fatty meats, such as hot dogs, ribs, sausage, bacon, rib-eye roast or steak. High-fat deli meats, such as salami and bologna. Caviar. Domestic duck and goose. Organ meats, such as liver. Dairy Cream, sour cream, cream cheese, and creamed cottage cheese. Whole milk cheeses. Whole or 2% milk (liquid, evaporated, or condensed). Whole buttermilk. Cream sauce or high-fat cheese sauce. Whole-milk yogurt. Fats and oils Meat fat, or shortening. Cocoa butter, hydrogenated oils, palm oil, coconut oil, palm kernel oil. Solid fats and shortenings, including bacon fat, salt pork, lard, and butter. Nondairy cream substitutes. Salad dressings with cheese or sour cream. Beverages Regular sodas and any drinks with added sugar. Sweets and desserts Frosting. Pudding. Cookies. Cakes. Pies. Milk chocolate or white  chocolate. Buttered syrups. Full-fat ice cream or ice cream drinks. The items listed above may not be a complete list of foods and beverages to avoid. Contact a dietitian for more information. Summary  Heart-healthy meal planning includes limiting unhealthy fats, increasing healthy fats, and making other diet and lifestyle changes.  Lose weight if you are overweight. Losing just 5-10% of your body weight can help your overall health and prevent diseases such as diabetes and heart disease.  Focus on eating a balance of foods, including fruits and vegetables, low-fat or nonfat dairy, lean protein, nuts and legumes, whole grains, and heart-healthy oils and fats. This information is not intended to replace advice given to you by your health care provider. Make sure you discuss any questions you have with your health care provider. Document Released: 03/10/2008 Document Revised: 07/09/2017 Document Reviewed: 07/09/2017 Elsevier Interactive Patient Education  2019 [employer].

## 2018-11-15 ENCOUNTER — Telehealth: Payer: Self-pay | Admitting: *Deleted

## 2018-11-15 DIAGNOSIS — F331 Major depressive disorder, recurrent, moderate: Secondary | ICD-10-CM | POA: Diagnosis not present

## 2018-11-15 DIAGNOSIS — F411 Generalized anxiety disorder: Secondary | ICD-10-CM | POA: Diagnosis not present

## 2018-11-15 NOTE — Telephone Encounter (Signed)
Noted thank you

## 2018-11-15 NOTE — Telephone Encounter (Signed)
Patient called and stated that she had a TeleVisit with you yesterday and you wanted her to take Crestor for her cholesterol. She has decided not to take it yet, stated that she wants to work on her diet first and see if she can get it down that way first. FYI.

## 2018-11-28 MED ORDER — PROGRAF 0.5 MG CAPSULE
ORAL_CAPSULE | Freq: Two times a day (BID) | ORAL | 2 refills | 0 days | Status: CP
Start: 2018-11-28 — End: ?

## 2018-12-06 ENCOUNTER — Telehealth: Payer: Self-pay | Admitting: *Deleted

## 2018-12-06 NOTE — Telephone Encounter (Signed)
Patient notified

## 2018-12-06 NOTE — Telephone Encounter (Signed)
Patient called and stated that [facility] Shon Hough [phone] called and is wanting her to go there for a day to do a bunch of testing. Patient stated that she is worried and nervous about going due to the [place]. Patient is wanting to suggest that she takes all the testing her in [place] instead. Worried that she will not recover if she gets the virus. Stated she wants your advise and back up on this. Please Advise.

## 2018-12-06 NOTE — Telephone Encounter (Signed)
Like any medical facility we are doing all we can to help prevent the spread of COVID-19 if her transplant team feel like this is necessary I would recommend she go for it there as they are following her. She should express her concerns with them and discuss what measures are in place to help protect her, this may make her feel more comfortable. She can also discuss with them on what other options are available if she feels strongly about not going.

## 2018-12-23 ENCOUNTER — Other Ambulatory Visit: Payer: Medicare Other

## 2018-12-23 ENCOUNTER — Encounter: Payer: Self-pay | Admitting: Family

## 2018-12-23 ENCOUNTER — Ambulatory Visit: Payer: Self-pay

## 2018-12-23 ENCOUNTER — Ambulatory Visit (INDEPENDENT_AMBULATORY_CARE_PROVIDER_SITE_OTHER): Payer: Medicare Other | Admitting: Family

## 2018-12-23 ENCOUNTER — Other Ambulatory Visit: Payer: Self-pay

## 2018-12-23 VITALS — BP 132/80 | HR 82 | Temp 98.3°F | Ht 62.0 in | Wt 143.0 lb

## 2018-12-23 DIAGNOSIS — Z1231 Encounter for screening mammogram for malignant neoplasm of breast: Secondary | ICD-10-CM

## 2018-12-23 DIAGNOSIS — R739 Hyperglycemia, unspecified: Secondary | ICD-10-CM

## 2018-12-23 DIAGNOSIS — Z0000 Encounter for general adult medical examination without abnormal findings: Secondary | ICD-10-CM | POA: Diagnosis not present

## 2018-12-23 DIAGNOSIS — E782 Mixed hyperlipidemia: Secondary | ICD-10-CM

## 2018-12-23 DIAGNOSIS — Z1239 Encounter for other screening for malignant neoplasm of breast: Secondary | ICD-10-CM | POA: Diagnosis not present

## 2018-12-23 DIAGNOSIS — Z1211 Encounter for screening for malignant neoplasm of colon: Secondary | ICD-10-CM | POA: Diagnosis not present

## 2018-12-23 NOTE — Patient Instructions (Signed)
Candace Hill , Thank you for taking time to come for your Medicare Wellness Visit. I appreciate your ongoing commitment to your health goals. Please review the following plan we discussed and let me know if I can assist you in the future.   Screening recommendations/referrals: Colonoscopy: ordered this visit  Mammogram: Ordered this visit  Bone Density : Ordered this visit  Recommended yearly ophthalmology/optometry visit for glaucoma screening and checkup Recommended yearly dental visit for hygiene and checkup  Vaccinations: Influenza vaccine : up to date  Pneumococcal vaccine : Ordered this visit  Tdap vaccine: Ordered this visit  Shingles vaccine: Ordered this visit   Advanced directives: No   Conditions/risks identified: Advance age female > 71 yrs, hyperlipidemia   Next appointment:1 Years    Preventive Care 71 Years and Older, Female Preventive care refers to lifestyle choices and visits with your health care provider that can promote health and wellness. What does preventive care include?  A yearly physical exam. This is also called an annual well check.  Dental exams once or twice a year.  Routine eye exams. Ask your health care provider how often you should have your eyes checked.  Personal lifestyle choices, including:  Daily care of your teeth and gums.  Regular physical activity.  Eating a healthy diet.  Avoiding tobacco and drug use.  Limiting alcohol use.  Practicing safe sex.  Taking low-dose aspirin every day.  Taking vitamin and mineral supplements as recommended by your health care provider. What happens during an annual well check? The services and screenings done by your health care provider during your annual well check will depend on your age, overall health, lifestyle risk factors, and family history of disease. Counseling  Your health care provider may ask you questions about your:  Alcohol use.  Tobacco use.  Drug use.  Emotional  well-being.  Home and relationship well-being.  Sexual activity.  Eating habits.  History of falls.  Memory and ability to understand (cognition).  Work and work Statistician.  Reproductive health. Screening  You may have the following tests or measurements:  Height, weight, and BMI.  Blood pressure.  Lipid and cholesterol levels. These may be checked every 5 years, or more frequently if you are over 75 years old.  Skin check.  Lung cancer screening. You may have this screening every year starting at age 71 if you have a 30-pack-year history of smoking and currently smoke or have quit within the past 15 years.  Fecal occult blood test (FOBT) of the stool. You may have this test every year starting at age 52.  Flexible sigmoidoscopy or colonoscopy. You may have a sigmoidoscopy every 5 years or a colonoscopy every 10 years starting at age 8.  Hepatitis C blood test.  Hepatitis B blood test.  Sexually transmitted disease (STD) testing.  Diabetes screening. This is done by checking your blood sugar (glucose) after you have not eaten for a while (fasting). You may have this done every 1-3 years.  Bone density scan. This is done to screen for osteoporosis. You may have this done starting at age 71.  Mammogram. This may be done every 1-2 years. Talk to your health care provider about how often you should have regular mammograms. Talk with your health care provider about your test results, treatment options, and if necessary, the need for more tests. Vaccines  Your health care provider may recommend certain vaccines, such as:  Influenza vaccine. This is recommended every year.  Tetanus, diphtheria, and  acellular pertussis (Tdap, Td) vaccine. You may need a Td booster every 10 years.  Zoster vaccine. You may need this after age 39.  Pneumococcal 13-valent conjugate (PCV13) vaccine. One dose is recommended after age 19.  Pneumococcal polysaccharide (PPSV23) vaccine. One  dose is recommended after age 61. Talk to your health care provider about which screenings and vaccines you need and how often you need them. This information is not intended to replace advice given to you by your health care provider. Make sure you discuss any questions you have with your health care provider. Document Released: 06/28/2015 Document Revised: 02/19/2016 Document Reviewed: 04/02/2015 Elsevier Interactive Patient Education  2017 [employer] Prevention in the Home Falls can cause injuries. They can happen to people of all ages. There are many things you can do to make your home safe and to help prevent falls. What can I do on the outside of my home?  Regularly fix the edges of walkways and driveways and fix any cracks.  Remove anything that might make you trip as you walk through a door, such as a raised step or threshold.  Trim any bushes or trees on the path to your home.  Use bright outdoor lighting.  Clear any walking paths of anything that might make someone trip, such as rocks or tools.  Regularly check to see if handrails are loose or broken. Make sure that both sides of any steps have handrails.  Any raised decks and porches should have guardrails on the edges.  Have any leaves, snow, or ice cleared regularly.  Use sand or salt on walking paths during winter.  Clean up any spills in your garage right away. This includes oil or grease spills. What can I do in the bathroom?  Use night lights.  Install grab bars by the toilet and in the tub and shower. Do not use towel bars as grab bars.  Use non-skid mats or decals in the tub or shower.  If you need to sit down in the shower, use a plastic, non-slip stool.  Keep the floor dry. Clean up any water that spills on the floor as soon as it happens.  Remove soap buildup in the tub or shower regularly.  Attach bath mats securely with double-sided non-slip rug tape.  Do not have throw rugs and other  things on the floor that can make you trip. What can I do in the bedroom?  Use night lights.  Make sure that you have a light by your bed that is easy to reach.  Do not use any sheets or blankets that are too big for your bed. They should not hang down onto the floor.  Have a firm chair that has side arms. You can use this for support while you get dressed.  Do not have throw rugs and other things on the floor that can make you trip. What can I do in the kitchen?  Clean up any spills right away.  Avoid walking on wet floors.  Keep items that you use a lot in easy-to-reach places.  If you need to reach something above you, use a strong step stool that has a grab bar.  Keep electrical cords out of the way.  Do not use floor polish or wax that makes floors slippery. If you must use wax, use non-skid floor wax.  Do not have throw rugs and other things on the floor that can make you trip. What can I do with my stairs?  Do not leave any items on the stairs.  Make sure that there are handrails on both sides of the stairs and use them. Fix handrails that are broken or loose. Make sure that handrails are as long as the stairways.  Check any carpeting to make sure that it is firmly attached to the stairs. Fix any carpet that is loose or worn.  Avoid having throw rugs at the top or bottom of the stairs. If you do have throw rugs, attach them to the floor with carpet tape.  Make sure that you have a light switch at the top of the stairs and the bottom of the stairs. If you do not have them, ask someone to add them for you. What else can I do to help prevent falls?  Wear shoes that:  Do not have high heels.  Have rubber bottoms.  Are comfortable and fit you well.  Are closed at the toe. Do not wear sandals.  If you use a stepladder:  Make sure that it is fully opened. Do not climb a closed stepladder.  Make sure that both sides of the stepladder are locked into place.  Ask  someone to hold it for you, if possible.  Clearly mark and make sure that you can see:  Any grab bars or handrails.  First and last steps.  Where the edge of each step is.  Use tools that help you move around (mobility aids) if they are needed. These include:  Canes.  Walkers.  Scooters.  Crutches.  Turn on the lights when you go into a dark area. Replace any light bulbs as soon as they burn out.  Set up your furniture so you have a clear path. Avoid moving your furniture around.  If any of your floors are uneven, fix them.  If there are any pets around you, be aware of where they are.  Review your medicines with your doctor. Some medicines can make you feel dizzy. This can increase your chance of falling. Ask your doctor what other things that you can do to help prevent falls. This information is not intended to replace advice given to you by your health care provider. Make sure you discuss any questions you have with your health care provider. Document Released: 03/28/2009 Document Revised: 11/07/2015 Document Reviewed: 07/06/2014 Elsevier Interactive Patient Education  2017 [employer].

## 2018-12-23 NOTE — Progress Notes (Signed)
Subjective:   Candace Hill is a 73 y.o. female who presents for Medicare Annual (Subsequent) preventive examination.  Review of Systems:   Cardiac Risk Factors include: advanced age (>40men, >47 women);dyslipidemia;sedentary lifestyle     Objective:     Vitals: BP 132/80   Pulse 82   Temp 98.3 F (36.8 C) (Oral)   Ht 5\' 2"  (1.575 m)   Wt 143 lb (64.9 kg)   SpO2 97%   BMI 26.16 kg/m   Body mass index is 26.16 kg/m.  Advanced Directives 12/23/2018 12/23/2018 11/14/2018 07/20/2018 12/20/2017 08/31/2016 08/31/2016  Does Patient Have a Medical Advance Directive? No No No No No No No  Type of Advance Directive - - - - - - -  Does patient want to make changes to medical advance directive? No - Patient declined No - Patient declined Yes (MAU/Ambulatory/Procedural Areas - Information given) - - No - Patient declined -  Copy of [place] in [place]  Would patient like information on creating a medical advance directive? No - Patient declined No - Patient declined - - Yes (MAU/Ambulatory/Procedural Areas - Information given) No - Patient declined -  Pre-existing out of facility DNR order (yellow form or pink MOST form) - - - - - - -    Tobacco Social History   Tobacco Use  Smoking Status Never Smoker  Smokeless Tobacco Never Used     Counseling given: Not Answered   Clinical Intake:  Pre-visit preparation completed: No  Pain : No/denies pain     BMI - recorded: 26.16 Nutritional Status: BMI 25 -29 Overweight Nutritional Risks: None Diabetes: No  How often do you need to have someone help you when you read instructions, pamphlets, or other written materials from your doctor or pharmacy?: 1 - Never What is the last grade level you completed in school?: [employer] 14 yrs  Interpreter Needed?: No  Information entered by :: Dinah Ngetich FNP-C  Past Medical History:  Diagnosis Date  . Abnormal uterine bleeding   . Acid reflux   . Asthma     [phone] due to black mold house, no asthma now  . Broken ankle    right  . Broken rib    x2  . 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 Central retinal vein occlusion few yrs   left eye retinal occlusion  . Complication of anesthesia    IV phenergan  serious mental reaction  . Depression   . Hepatitis C    HEP C  . Hernia, femoral    left  . History of blood transfusion 1971   acquired hepatitis c  . History of kidney stones   . History of renal stone   . History of shingles   . Hypertension    due to profraf  . Kidney stones    x 4 per new patient packet   . Renal stone 11/2012  . Seasonal affective disorder ([place])   . 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 Tailbone injury age 55 and age 72 and few weeks ago   broken x3  . TIA (transient ischemic attack)   . 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 CATARACT EXTRACTION     bilateral  . CHOLECYSTECTOMY  1997  .  CYSTOSCOPY W/ URETERAL STENT PLACEMENT Left 08/14/2016   Procedure: CYSTOSCOPY WITH RETROGRADE PYELOGRAM/URETERAL LEFT URETEROSCOPY AND LEFT STENT PLACEMENT;  Surgeon: Raynelle Bring, MD;  Location: [facility];  Service: Urology;  Laterality: Left;  . CYSTOSCOPY/URETEROSCOPY/HOLMIUM LASER/STENT PLACEMENT Left 07/06/2016   Procedure: CYSTOSCOPY/URETEROSCOPY/ BILATERAL RETROGRADE/HOLMIUM LASER/STENT PLACEMENT/BASKET STONE REMOVAL;  Surgeon: Raynelle Bring, MD;  Location: [facility];  Service: Urology;  Laterality: Left;  . CYSTOSCOPY/URETEROSCOPY/HOLMIUM LASER/STENT PLACEMENT Left 08/06/2016   Procedure: CYSTOSCOPY/URETEROSCOPY/HOLMIUM LASER/STENT PLACEMENT;  Surgeon: Raynelle Bring, MD;  Location: [facility];  Service: Urology;  Laterality: Left;  . CYSTOSCOPY/URETEROSCOPY/HOLMIUM LASER/STENT PLACEMENT Left 08/31/2016   Procedure: CYSTOSCOPY/URETEROSCOPY/HOLMIUM LASER/STENT PLACEMENT/BASKET STONE  REMOVAL;  Surgeon: Raynelle Bring, MD;  Location: [facility];  Service: Urology;  Laterality: Left;  . FEMORAL HERNIA REPAIR  1997   left leg  . NASAL SINUS SURGERY  1997   x3  . TONSILLECTOMY  age 51  . 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 TUBAL LIGATION     Family History  Adopted: Yes  Problem Relation Age of Onset  . Stroke Mother   . Alzheimer's disease Mother   . Cancer Mother   . Diabetes Mother        Questionable, per new patient packet   . Arthritis Mother        Questionable, per new patient packet   . Alcoholism Father   . COPD Brother        Heavy smoker, per new patient packet   . Suicidality Sister   . Depression Sister   . COPD Sister   . OCD Daughter   . Personality disorder Daughter   . Alcoholism Son    Social History   Socioeconomic History  . Marital status: Divorced    Spouse name: Not on file  . Number of children: 2  . Years of education: 84  . Highest education level: Not on file  Occupational History  . Occupation: Disabled  Social Needs  . Financial resource strain: Not hard at all  . Food insecurity    Worry: Never true    Inability: Never true  . Transportation needs    Medical: No    Non-medical: No  Tobacco Use  . Smoking status: Never Smoker  . Smokeless tobacco: Never Used  Substance and Sexual Activity  . Alcohol use: No  . Drug use: No  . Sexual activity: Never    Comment: 14 years ago  Lifestyle  . Physical activity    Days per week: 5 days    Minutes per session: 20 min  . Stress: To some extent  Relationships  . Social Herbalist on phone: Never    Gets together: Never    Attends religious service: Never    Active member of club or organization: No    Attends meetings of clubs or organizations: Never    Relationship status: Divorced  Other Topics Concern  . Not on file  Social History Narrative   Lives at alone alone.   Right-handed.   No caffeine use.      PSC- as of 10/12/17   Diet: No Fried Foods        Caffeine: Summer time, sweet tea       Married, if yes what year: Divorced       Do you live in a house, apartment, assisted living, condo, trailer, ect: [place], 1 stories, and 1 person       Pets: 1 dog  Current/Past profession: Retail buyer       Exercise: Some, walking dog       Living Will: yes   DNR: yes   POA/HPOA: yes      Functional Status: Patient did not answer   Do you have difficulty bathing or dressing yourself?   Do you have difficulty preparing food or eating?   Do you have difficulty managing your medications?   Do you have difficulty managing your finances?   Do you have difficulty affording your medications?    Outpatient Encounter Medications as of 12/23/2018  Medication Sig  . acetaminophen (TYLENOL) 500 MG tablet Take 500-1,000 mg by mouth every 6 (six) hours as needed for mild pain, moderate pain or headache.   Marland Kitchen aspirin EC 81 MG tablet Take 81 mg by mouth daily.  . brimonidine (ALPHAGAN P) 0.1 % SOLN Place 1 drop into both eyes daily.   . DULoxetine (CYMBALTA) 60 MG capsule Take 1 capsule (60 mg total) by mouth daily.  . Ferrous Sulfate (IRON) 325 (65 Fe) MG TABS Take 1 tablet by mouth daily.  . folic acid (FOLVITE) 161 MCG tablet Take 400 mcg by mouth daily.  . hydrOXYzine (ATARAX/VISTARIL) 25 MG tablet Take 25 mg by mouth every 6 (six) hours as needed for itching.  . potassium chloride (K-DUR,KLOR-CON) 10 MEQ tablet Take 10 mEq by mouth daily.  Marland Kitchen pyridoxine (B-6) 100 MG tablet Take 100 mg by mouth daily.  . tacrolimus (PROGRAF) 0.5 MG capsule Take 4 by mouth at bedtime   No facility-administered encounter medications on file as of 12/23/2018.     Activities of Daily Living In your present state of health, do you have any difficulty performing the following activities: 12/23/2018  Hearing? N  Vision? Y  Comment impaired vision on left eye  Difficulty concentrating or making decisions? Y  Comment concentrating,remembering and making  decision  Walking or climbing stairs? N  Dressing or bathing? N  Doing errands, shopping? N  Preparing Food and eating ? N  Using the Toilet? N  In the past six months, have you accidently leaked urine? Y  Comment for one year  Do you have problems with loss of bowel control? Y  Managing your Medications? N  Managing your Finances? N  Housekeeping or managing your Housekeeping? N  Some recent data might be hidden    Patient Care Team: Lauree Chandler, NP as PCP - General (Geriatric Medicine) Rolm Bookbinder, MD as Consulting Physician (Dermatology) Gerarda Fraction, MD as Referring Physician (Ophthalmology) Raynelle Bring, MD as Consulting Physician (Urology)    Assessment:   This is a routine wellness examination for Ama.  Exercise Activities and Dietary recommendations Current Exercise Habits: The patient does not participate in regular exercise at present, Exercise limited by: None identified  Goals   None     Fall Risk Fall Risk  12/23/2018 12/23/2018 12/23/2018 11/14/2018 07/12/2018  Falls in the past year? 1 0 0 1 1  Number falls in past yr: 0 0 0 0 1  Injury with Fall? 1 0 0 1 0  Comment broke her nose - - Broke nose and dental concerns  -  Risk for fall due to : Other (Comment) - - History of fall(s);Impaired balance/gait -  Risk for fall due to: Comment fell when raining " storm" - - - -  Follow up Falls prevention discussed - - - -   Is the patient's home free of loose throw rugs in walkways, pet  beds, electrical cords, etc?   no      Grab bars in the bathroom? no      Handrails on the stairs?   yes      Adequate lighting?   yes  Depression Screen PHQ 2/9 Scores 12/23/2018 12/23/2018 11/14/2018 12/20/2017  PHQ - 2 Score 0 0 - 2  PHQ- 9 Score - - - 8  Exception Documentation - - Other- indicate reason in comment box -  Not completed - - Was under the care of a counslor.  -     Cognitive Function MMSE - Mini Mental State Exam 12/23/2018 12/20/2017  Orientation to  time 4 4  Orientation to Place 4 4  Registration 3 3  Attention/ Calculation 5 5  Recall 3 3  Language- name 2 objects 2 2  Language- repeat 1 1  Language- follow 3 step command 3 3  Language- read & follow direction 1 1  Write a sentence 1 1  Copy design 1 1  Total score 28 28        Immunization History  Administered Date(s) Administered  . Influenza, Seasonal, Injecte, Preservative Fre 03/18/2009  . Influenza,inj,Quad PF,6+ Mos 04/08/2018  . Influenza-Unspecified 03/16/2017  . Pneumococcal Conjugate-13 07/12/2018    Qualifies for Shingles Vaccine?No   Screening Tests Health Maintenance  Topic Date Due  . TETANUS/TDAP  10/17/1966  . MAMMOGRAM  08/10/2016  . INFLUENZA VACCINE  01/14/2019  . PNA vac Low Risk Adult (2 of 2 - PPSV23) 07/13/2019  . COLONOSCOPY  01/26/2023  . DEXA SCAN  Completed  . Hepatitis C Screening  Completed    Cancer Screenings: Lung: Low Dose CT Chest recommended if Age 52-80 years, 30 pack-year currently smoking OR have quit w/in 15years. Patient does not qualify. Breast:  Up to date on Mammogram? No   Up to date of Bone Density/Dexa? No Colorectal: las done 2017   Additional Screenings: Hepatitis C Screening: completed      Plan:   - colonoscopy  - Mammogram  - Tdap vaccine  - Shingrix vaccine  - Pneumococcal vaccine   I have personally reviewed and noted the following in the patient's chart:   . Medical and social history . Use of alcohol, tobacco or illicit drugs  . Current medications and supplements . Functional ability and status . Nutritional status . Physical activity . Advanced directives . List of other physicians . Hospitalizations, surgeries, and ER visits in previous 12 months . Vitals . Screenings to include cognitive, depression, and falls . Referrals and appointments  In addition, I have reviewed and discussed with patient certain preventive protocols, quality metrics, and best practice recommendations. A  written personalized care plan for preventive services as well as general preventive health recommendations were provided to patient.    Sandrea Hughs, NP  12/23/2018

## 2018-12-24 LAB — COMPLETE METABOLIC PANEL WITH GFR
AG Ratio: 1.9 (calc) (ref 1.0–2.5)
ALT: 11 U/L (ref 6–29)
AST: 17 U/L (ref 10–35)
Albumin: 4.5 g/dL (ref 3.6–5.1)
Alkaline phosphatase (APISO): 57 U/L (ref 37–153)
BUN/Creatinine Ratio: 13 (calc) (ref 6–22)
BUN: 14 mg/dL (ref 7–25)
CO2: 27 mmol/L (ref 20–32)
Calcium: 9.8 mg/dL (ref 8.6–10.4)
Chloride: 102 mmol/L (ref 98–110)
Creat: 1.1 mg/dL — ABNORMAL HIGH (ref 0.60–0.93)
GFR, Est African American: 58 mL/min/{1.73_m2} — ABNORMAL LOW (ref >=60)
GFR, Est Non African American: 50 mL/min/{1.73_m2} — ABNORMAL LOW (ref >=60)
Globulin: 2.4 g/dL (calc) (ref 1.9–3.7)
Glucose, Bld: 105 mg/dL — ABNORMAL HIGH (ref 65–99)
Potassium: 4 mmol/L (ref 3.5–5.3)
Sodium: 141 mmol/L (ref 135–146)
Total Bilirubin: 0.4 mg/dL (ref 0.2–1.2)
Total Protein: 6.9 g/dL (ref 6.1–8.1)

## 2018-12-24 LAB — HEMOGLOBIN A1C
Hgb A1c MFr Bld: 5.3 % of total Hgb (ref <5.7)
Mean Plasma Glucose: 105 (calc)
eAG (mmol/L): 5.8 (calc)

## 2018-12-24 LAB — LIPID PANEL
Cholesterol: 213 mg/dL — ABNORMAL HIGH (ref <200)
HDL: 54 mg/dL (ref >=50)
LDL Cholesterol (Calc): 138 mg/dL (calc) — ABNORMAL HIGH
Non-HDL Cholesterol (Calc): 159 mg/dL (calc) — ABNORMAL HIGH (ref <130)
Total CHOL/HDL Ratio: 3.9 (calc) (ref <5.0)
Triglycerides: 106 mg/dL (ref <150)

## 2018-12-30 ENCOUNTER — Ambulatory Visit: Payer: Medicare Other | Admitting: Gastroenterology

## 2019-01-13 DIAGNOSIS — Z1231 Encounter for screening mammogram for malignant neoplasm of breast: Secondary | ICD-10-CM | POA: Diagnosis not present

## 2019-01-13 LAB — HM MAMMOGRAPHY

## 2019-01-17 ENCOUNTER — Encounter: Payer: Self-pay | Admitting: *Deleted

## 2019-01-20 DIAGNOSIS — H348122 Central retinal vein occlusion, left eye, stable: Secondary | ICD-10-CM | POA: Diagnosis not present

## 2019-01-20 DIAGNOSIS — H401131 Primary open-angle glaucoma, bilateral, mild stage: Secondary | ICD-10-CM | POA: Diagnosis not present

## 2019-01-31 ENCOUNTER — Ambulatory Visit: Payer: Medicare Other | Admitting: Internal Medicine

## 2019-01-31 ENCOUNTER — Ambulatory Visit: Payer: Medicare Other | Admitting: Gastroenterology

## 2019-02-10 DIAGNOSIS — F331 Major depressive disorder, recurrent, moderate: Secondary | ICD-10-CM | POA: Diagnosis not present

## 2019-02-10 DIAGNOSIS — F411 Generalized anxiety disorder: Secondary | ICD-10-CM | POA: Diagnosis not present

## 2019-02-14 ENCOUNTER — Ambulatory Visit: Payer: Medicare Other | Admitting: Nurse Practitioner

## 2019-02-15 ENCOUNTER — Other Ambulatory Visit: Payer: Self-pay

## 2019-02-15 ENCOUNTER — Ambulatory Visit (INDEPENDENT_AMBULATORY_CARE_PROVIDER_SITE_OTHER): Payer: Medicare Other | Admitting: Nurse Practitioner

## 2019-02-15 ENCOUNTER — Encounter: Payer: Self-pay | Admitting: Nurse Practitioner

## 2019-02-15 VITALS — BP 116/78 | HR 64 | Temp 97.3°F | Ht 62.0 in | Wt 146.6 lb

## 2019-02-15 DIAGNOSIS — E559 Vitamin D deficiency, unspecified: Secondary | ICD-10-CM

## 2019-02-15 DIAGNOSIS — F339 Major depressive disorder, recurrent, unspecified: Secondary | ICD-10-CM

## 2019-02-15 DIAGNOSIS — Z23 Encounter for immunization: Secondary | ICD-10-CM | POA: Diagnosis not present

## 2019-02-15 DIAGNOSIS — Z8619 Personal history of other infectious and parasitic diseases: Secondary | ICD-10-CM | POA: Diagnosis not present

## 2019-02-15 DIAGNOSIS — N201 Calculus of ureter: Secondary | ICD-10-CM

## 2019-02-15 DIAGNOSIS — K219 Gastro-esophageal reflux disease without esophagitis: Secondary | ICD-10-CM

## 2019-02-15 DIAGNOSIS — E782 Mixed hyperlipidemia: Secondary | ICD-10-CM

## 2019-02-15 NOTE — Progress Notes (Signed)
Careteam: Patient Care Team: Lauree Chandler, NP as PCP - General (Geriatric Medicine) Rolm Bookbinder, MD as Consulting Physician (Dermatology) Gerarda Fraction, MD as Referring Physician (Ophthalmology) Raynelle Bring, MD as Consulting Physician (Urology)  Advanced Directive information    Allergies  Allergen Reactions  . Codeine Nausea Only  . 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    Candace Hill Penicillins Nausea Only and Other (See Comments)    Has patient had a PCN reaction causing immediate rash, facial/tongue/throat swelling, SOB or lightheadedness with hypotension: No Has patient had a PCN reaction causing severe rash involving mucus membranes or skin necrosis: No Has patient had a PCN reaction that required hospitalization No Has patient had a PCN reaction occurring within the last 10 years: No If all of the above answers are "NO", then may proceed with Cephalosporin use.  Candace Hill Phenergan [Promethazine] Other (See Comments)    Reaction:  Hallucinations  Pt states that she is only allergic to IV form.   . Sulfa Antibiotics Itching  . Tamsulosin Itching  . Lidocaine Palpitations    Chief Complaint  Patient presents with  . Medical Management of Chronic Issues    3 month follow-up   . Medication Management    Patient take 2 potassium tablets daily, need to discuss what does is recommended      HPI: Patient is a 73 y.o. female seen in the office today for routine follow up.  Reports she has not felt the same since [place].  States she followed up with neurologist and he cleared her and say "you look fine"  Having times when she is weak- also has had a lot of stress with her home. Her daughter is going through a lot and she is trying to help her with that. Daughter has borderline personality disorder and this is very stressful for her. Staying in her house more than anyone.  Reports she is dizzy- but "not dizzy  enough to stop moving"  Not doing any exercise  Sleeping longer  Anxiety/depression/Seasonal affective d/o- ongoing depression-Feels like her brain is moving slower. Dog died and "he was the only fun in my life"   Has appt with a psychiatrist coming up next week. Had a bad experience in the past and have been hesitant to set up another appt.   taking Cymbalta 60 mg by mouth daily- anxiety and depression worse at [place].  Hx nephrolithiasis - followed by [facility] Dr Alinda Money. She has had stents and lithotripsy in the past.Taking potassium 20 meq daily.   Hx Vit D deficiency -she was takingweekly Vit D 50K units but never got a renewal. Not aware she could get this OTC   GERD - stable on Diet modifications.  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 Followed by [facility] transplant team Dr Lemmie Evens. Currently takes prograf. She was tx and cured Hepatitis C by [facility]. LFTs remained stable. Hep C contracted following blood transfusion after SVD in 1950s but was not dx until 1997.has not followed up recently - afraid of going due to [place] and does not have someone to go with her.   Will have increase in bowel incontinence when she is very stressed Due for colonoscopy  Review of  Systems:  Review of Systems  Constitutional: Positive for malaise/fatigue. Negative for chills, fever and weight loss.  HENT: Negative for tinnitus.   Respiratory: Negative for cough, sputum production and shortness of breath.   Cardiovascular: Negative for chest pain, palpitations and leg swelling.  Gastrointestinal: Negative for abdominal pain, constipation, diarrhea and heartburn.       Bowel incontinence with increase stressed and nervousness.   Genitourinary: Positive for frequency. Negative for dysuria and urgency.   Musculoskeletal: Negative for back pain, falls, joint pain and myalgias.  Skin: Negative.   Neurological: Positive for dizziness (occasional). Negative for sensory change, speech change, weakness and headaches.  Psychiatric/Behavioral: Positive for depression. Negative for memory loss. The patient is nervous/anxious. The patient does not have insomnia.     Past Medical History:  Diagnosis Date  . Abnormal uterine bleeding   . Acid reflux   . Asthma    [phone] due to black mold house, no asthma now  . Broken ankle    right  . Broken rib    x2  . Cancer ([place])    liver (in the one that was removed)  . Central retinal vein occlusion few yrs   left eye retinal occlusion  . Complication of anesthesia    IV phenergan  serious mental reaction  . Depression   . Hepatitis C    HEP C  . Hernia, femoral    left  . History of blood transfusion 1971   acquired hepatitis c  . History of kidney stones   . History of renal stone   . History of shingles   . Hypertension    due to profraf  . Kidney stones    x 4 per new patient packet   . Renal stone 11/2012  . Seasonal affective disorder ([place])   . Stroke [facility])    TIA x2 caused by prednisone  . Tailbone injury age 21 and age 69 and few weeks ago   broken x3  . TIA (transient ischemic attack)   . 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 CATARACT EXTRACTION     bilateral  . CHOLECYSTECTOMY  1997  . CYSTOSCOPY W/ URETERAL STENT PLACEMENT Left 08/14/2016   Procedure: CYSTOSCOPY WITH RETROGRADE PYELOGRAM/URETERAL LEFT URETEROSCOPY AND LEFT STENT PLACEMENT;  Surgeon: Raynelle Bring, MD;  [facility];  Service: Urology;  Laterality: Left;  . CYSTOSCOPY/URETEROSCOPY/HOLMIUM LASER/STENT PLACEMENT Left 07/06/2016   Procedure: CYSTOSCOPY/URETEROSCOPY/ BILATERAL RETROGRADE/HOLMIUM LASER/STENT PLACEMENT/BASKET STONE REMOVAL;   Surgeon: Raynelle Bring, MD;  [facility];  Service: Urology;  Laterality: Left;  . CYSTOSCOPY/URETEROSCOPY/HOLMIUM LASER/STENT PLACEMENT Left 08/06/2016   Procedure: CYSTOSCOPY/URETEROSCOPY/HOLMIUM LASER/STENT PLACEMENT;  Surgeon: Raynelle Bring, MD;  [facility];  Service: Urology;  Laterality: Left;  . CYSTOSCOPY/URETEROSCOPY/HOLMIUM LASER/STENT PLACEMENT Left 08/31/2016   Procedure: CYSTOSCOPY/URETEROSCOPY/HOLMIUM LASER/STENT PLACEMENT/BASKET STONE REMOVAL;  Surgeon: Raynelle Bring, MD;  [facility];  Service: Urology;  Laterality: Left;  . FEMORAL HERNIA REPAIR  1997   left leg  . LIVER TRANSPLANTATION    . NASAL SINUS SURGERY  1997   x3  . TONSILLECTOMY  age 82  . 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 TUBAL LIGATION     Social History:   reports that she has never smoked. She has never used smokeless tobacco. She reports that she does not drink alcohol or use drugs.  Family History  Adopted: Yes  Problem  Relation Age of Onset  . Stroke Mother   . Alzheimer's disease Mother   . Cancer Mother   . Diabetes Mother        Questionable, per new patient packet   . Arthritis Mother        Questionable, per new patient packet   . Alcoholism Father   . COPD Brother        Heavy smoker, per new patient packet   . Suicidality Sister   . Depression Sister   . COPD Sister   . OCD Daughter   . Personality disorder Daughter   . Alcoholism Son     Medications: Patient's Medications  New Prescriptions   No medications on file  Previous Medications   ACETAMINOPHEN (TYLENOL) 500 MG TABLET    Take 500-1,000 mg by mouth every 6 (six) hours as needed for mild pain, moderate pain or headache.    ASPIRIN EC 81 MG TABLET    Take 81 mg by mouth daily.   BRIMONIDINE (ALPHAGAN P) 0.1 % SOLN    Place 1 drop into both eyes daily.    DULOXETINE (CYMBALTA) 60 MG CAPSULE    Take 1 capsule (60 mg total) by mouth daily.   FERROUS SULFATE (IRON) 325 (65 FE) MG TABS    Take 1 tablet by mouth daily.    FOLIC ACID (FOLVITE) A999333 MCG TABLET    Take 400 mcg by mouth daily.   HYDROXYZINE (ATARAX/VISTARIL) 25 MG TABLET    Take 25 mg by mouth every 6 (six) hours as needed for itching.   POTASSIUM CHLORIDE (K-DUR,KLOR-CON) 10 MEQ TABLET    Take 20 mEq by mouth daily.    PYRIDOXINE (B-6) 100 MG TABLET    Take 100 mg by mouth daily.   TACROLIMUS (PROGRAF) 0.5 MG CAPSULE    Take 4 by mouth at bedtime  Modified Medications   No medications on file  Discontinued Medications   No medications on file    Physical Exam:  Vitals:   02/15/19 1458  BP: 116/78  Pulse: 64  Temp: (!) 97.3 F (36.3 C)  TempSrc: Temporal  SpO2: 98%  Weight: 146 lb 9.6 oz (66.5 kg)  Height: 5\' 2"  (1.575 m)   Body mass index is 26.81 kg/m. Wt Readings from Last 3 Encounters:  02/15/19 146 lb 9.6 oz (66.5 kg)  12/23/18 143 lb (64.9 kg)  07/20/18 145 lb (65.8 kg)    Physical Exam Constitutional:      Appearance: Normal appearance. She is well-developed.  HENT:     Head: Normocephalic and atraumatic.     Right Ear: Tympanic membrane, ear canal and external ear normal.     Left Ear: Tympanic membrane, ear canal and external ear normal.  Neck:     Musculoskeletal: Neck supple.     Thyroid: No thyromegaly.     Vascular: No carotid bruit.     Trachea: No tracheal deviation.  Cardiovascular:     Rate and Rhythm: Normal rate and regular rhythm.     Heart sounds: Murmur (1/6 SEM) present. No friction rub. No gallop.   Pulmonary:     Effort: Pulmonary effort is normal. No respiratory distress.     Breath sounds: Normal breath sounds. No stridor. No wheezing or rales.  Abdominal:     Palpations: Abdomen is not rigid. There is no hepatomegaly.  Lymphadenopathy:     Cervical: No cervical adenopathy.  Skin:    General: Skin is warm and dry.  Coloration: Skin is not pale.  Neurological:     Mental Status: She is alert and oriented to person, place, and time.     Motor: No weakness.     Coordination:  Coordination normal.     Gait: Gait normal.     Deep Tendon Reflexes: Reflexes are normal and symmetric.  Psychiatric:        Mood and Affect: Mood is anxious.        Thought Content: Thought content normal. Thought content does not include suicidal ideation. Thought content does not include suicidal plan.     Labs reviewed: Basic Metabolic Panel: Recent Labs    07/12/18 1605 11/08/18 0836 12/23/18 0824  NA 141 141 141  K 3.8 4.4 4.0  CL 102 102 102  CO2 29 31 27   GLUCOSE 88 108* 105*  BUN 17 15 14   CREATININE 1.19* 1.06* 1.10*  CALCIUM 9.5 10.2 9.8   Liver Function Tests: Recent Labs    07/12/18 1605 11/08/18 0836 12/23/18 0824  AST 34 20 17  ALT 34* 14 11  BILITOT 0.6 0.6 0.4  PROT 6.9 7.3 6.9   No results for input(s): LIPASE, AMYLASE in the last 8760 hours. No results for input(s): AMMONIA in the last 8760 hours. CBC: Recent Labs    04/08/18 1143 07/12/18 1605 11/08/18 0836  WBC 8.0 8.8 8.9  NEUTROABS 5,288 5,465 5,474  HGB 14.6 14.5 15.2  HCT 42.3 41.7 44.3  MCV 90.0 88.7 89.1  PLT 200 205 216   Lipid Panel: Recent Labs    11/08/18 0836 12/23/18 0824  CHOL 236* 213*  HDL 62 54  LDLCALC 156* 138*  TRIG 78 106  CHOLHDL 3.8 3.9   TSH: No results for input(s): TSH in the last 8760 hours. A1C: Lab Results  Component Value Date   HGBA1C 5.3 12/23/2018     Assessment/Plan 1. Need for influenza vaccination - Flu Vaccine QUAD High Dose(Fluad)  2. Vitamin D deficiency -not currently on supplement - Vitamin D, 25-hydroxy  3. History of hepatitis C -cured s/p treatment  - COMPLETE METABOLIC PANEL WITH GFR  4. Gastroesophageal reflux disease, esophagitis presence not specified Controlled with diet.   5. 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 She is having worsening memory loss and trouble concentrating, suspect this is due to depression. -no SI or HI at this time.   -continues on cymbalta 60 mg daily   6. Mixed hyperlipidemia Encouraged to work on dietary modifications as she is able.  - COMPLETE METABOLIC PANEL WITH GFR  7. Ureteral stone -continues on potassium supplement per urology, no recent stones - COMPLETE METABOLIC PANEL WITH GFR  Next appt: 6 months.  Carlos American. [place], [facility] [phone]

## 2019-02-16 LAB — COMPLETE METABOLIC PANEL WITH GFR
AG Ratio: 2.1 (calc) (ref 1.0–2.5)
ALT: 17 U/L (ref 6–29)
AST: 20 U/L (ref 10–35)
Albumin: 4.5 g/dL (ref 3.6–5.1)
Alkaline phosphatase (APISO): 46 U/L (ref 37–153)
BUN/Creatinine Ratio: 13 (calc) (ref 6–22)
BUN: 13 mg/dL (ref 7–25)
CO2: 30 mmol/L (ref 20–32)
Calcium: 9.2 mg/dL (ref 8.6–10.4)
Chloride: 102 mmol/L (ref 98–110)
Creat: 1.01 mg/dL — ABNORMAL HIGH (ref 0.60–0.93)
GFR, Est African American: 65 mL/min/{1.73_m2} (ref >=60)
GFR, Est Non African American: 56 mL/min/{1.73_m2} — ABNORMAL LOW (ref >=60)
Globulin: 2.1 g/dL (calc) (ref 1.9–3.7)
Glucose, Bld: 93 mg/dL (ref 65–139)
Potassium: 4 mmol/L (ref 3.5–5.3)
Sodium: 140 mmol/L (ref 135–146)
Total Bilirubin: 0.5 mg/dL (ref 0.2–1.2)
Total Protein: 6.6 g/dL (ref 6.1–8.1)

## 2019-02-16 LAB — VITAMIN D 25 HYDROXY (VIT D DEFICIENCY, FRACTURES): Vit D, 25-Hydroxy: 29 ng/mL — ABNORMAL LOW (ref 30–100)

## 2019-02-22 DIAGNOSIS — F411 Generalized anxiety disorder: Secondary | ICD-10-CM | POA: Diagnosis not present

## 2019-02-22 DIAGNOSIS — F331 Major depressive disorder, recurrent, moderate: Secondary | ICD-10-CM | POA: Diagnosis not present

## 2019-03-04 DIAGNOSIS — Z944 Liver transplant status: Secondary | ICD-10-CM

## 2019-03-04 DIAGNOSIS — Z79899 Other long term (current) drug therapy: Secondary | ICD-10-CM

## 2019-03-06 DIAGNOSIS — Z944 Liver transplant status: Secondary | ICD-10-CM

## 2019-03-06 DIAGNOSIS — Z79899 Other long term (current) drug therapy: Secondary | ICD-10-CM

## 2019-03-06 DIAGNOSIS — Z5181 Encounter for therapeutic drug level monitoring: Secondary | ICD-10-CM

## 2019-03-06 DIAGNOSIS — E612 Magnesium deficiency: Secondary | ICD-10-CM

## 2019-03-06 MED ORDER — PROGRAF 0.5 MG CAPSULE: 2 mg | capsule | Freq: Two times a day (BID) | 2 refills | 30 days | Status: AC

## 2019-03-06 MED ORDER — PROGRAF 0.5 MG CAPSULE
ORAL_CAPSULE | Freq: Two times a day (BID) | ORAL | 2 refills | 30.00000 days | Status: CP
Start: 2019-03-06 — End: 2019-03-06

## 2019-03-07 DIAGNOSIS — Z944 Liver transplant status: Secondary | ICD-10-CM

## 2019-03-08 DIAGNOSIS — F411 Generalized anxiety disorder: Secondary | ICD-10-CM | POA: Diagnosis not present

## 2019-03-08 DIAGNOSIS — F331 Major depressive disorder, recurrent, moderate: Secondary | ICD-10-CM | POA: Diagnosis not present

## 2019-03-13 DIAGNOSIS — E612 Magnesium deficiency: Secondary | ICD-10-CM

## 2019-03-13 DIAGNOSIS — Z944 Liver transplant status: Secondary | ICD-10-CM

## 2019-03-13 DIAGNOSIS — Z5181 Encounter for therapeutic drug level monitoring: Secondary | ICD-10-CM

## 2019-03-14 ENCOUNTER — Ambulatory Visit (INDEPENDENT_AMBULATORY_CARE_PROVIDER_SITE_OTHER): Payer: Medicare Other | Admitting: Gastroenterology

## 2019-03-14 ENCOUNTER — Encounter: Payer: Self-pay | Admitting: Gastroenterology

## 2019-03-14 VITALS — BP 120/70 | HR 96 | Temp 99.0°F | Ht 62.0 in | Wt 145.0 lb

## 2019-03-14 DIAGNOSIS — Z79899 Other long term (current) drug therapy: Secondary | ICD-10-CM | POA: Diagnosis not present

## 2019-03-14 DIAGNOSIS — R194 Change in bowel habit: Secondary | ICD-10-CM | POA: Diagnosis not present

## 2019-03-14 NOTE — Progress Notes (Signed)
[facility] Consult Note:  History: Candace Hill 03/14/2019  Referring provider: Lauree Chandler, NP  Reason for consult/chief complaint: Encopresis (Stress related,)   Subjective  HPI:  This is a pleasant 73 year old woman referred by primary care for change in bowel habits.  She has intermittent small, soft pellet-like stools, but sometimes they have a "jelly" consistency with urgency and this is a small volume incontinence when that occurs.  It sounds like feels incompletely evacuated, and then may be sitting watching television and try to pass gas, but then end up having an episode of incontinence.  There is no rectal bleeding, she denies abdominal pain nausea or vomiting.  Her last colonoscopy was reportedly 8 years ago with unknown provider.  Based on her description of provider and office location, it may have been Dr. Juanita Craver.  Report and findings are unknown.  Candace Hill also has urinary frequency and episodes of incontinence as well, and is followed by a provider at [facility] urology.  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  This was done at [facility] in 2009 and she is followed with their transplant hepatology team afterwards, but does not appear to have seen them in some time.  She said they have been calling her this year for follow-up but she did not feel she should go to that hospital campus for [place] related reasons.  Bert was a tangential historian, going on extensively about stressors that have occurred in the last year and a half or so, having to move out of her house because there was asbestos, then a new residence being infested with snakes and cockroaches.  Her intention with that story seem to be to convey increased stress, which seems to aggravate her digestive symptoms.  ROS:  Review of Systems  Constitutional: Positive for fatigue. Negative for appetite change and unexpected weight change.   HENT: Negative for mouth sores and voice change.   Eyes: Negative for pain and redness.  Respiratory: Negative for cough and shortness of breath.   Cardiovascular: Negative for chest pain and palpitations.  Genitourinary: Negative for dysuria and hematuria.  Musculoskeletal: Negative for arthralgias and myalgias.  Skin: Negative for pallor and rash.  Neurological: Negative for weakness and headaches.  Hematological: Negative for adenopathy.  Psychiatric/Behavioral:       Depression     Past Medical History: Past Medical History:  Diagnosis Date  . Abnormal uterine bleeding   . Acid reflux   . Asthma    [phone] due to black mold house, no asthma now  . Broken ankle    right  . Broken rib    x2  . Cancer ([place])    liver (in the one that was removed)  . Central retinal vein occlusion few yrs   left eye retinal occlusion  . Complication of anesthesia    IV phenergan  serious mental reaction  . Depression   . Hepatitis C    HEP C  . Hernia, femoral    left  . History of blood transfusion 1971   acquired hepatitis c  . History of kidney stones   . History of renal stone   . History of shingles   . Hypertension    due to profraf  . Kidney stones    x 4 per new patient packet   . Renal stone 11/2012  . Seasonal affective disorder ([place])   . Stroke [facility])    TIA x2 caused by prednisone  .  Tailbone injury age 67 and age 50 and few weeks ago   broken x3  . TIA (transient ischemic attack)   . 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 CATARACT EXTRACTION     bilateral  . CHOLECYSTECTOMY  1997  . CYSTOSCOPY W/ URETERAL STENT PLACEMENT Left 08/14/2016   Procedure: CYSTOSCOPY WITH RETROGRADE PYELOGRAM/URETERAL LEFT URETEROSCOPY AND LEFT STENT PLACEMENT;  Surgeon: Raynelle Bring, MD;  [facility];  Service: Urology;  Laterality: Left;   . CYSTOSCOPY/URETEROSCOPY/HOLMIUM LASER/STENT PLACEMENT Left 07/06/2016   Procedure: CYSTOSCOPY/URETEROSCOPY/ BILATERAL RETROGRADE/HOLMIUM LASER/STENT PLACEMENT/BASKET STONE REMOVAL;  Surgeon: Raynelle Bring, MD;  [facility];  Service: Urology;  Laterality: Left;  . CYSTOSCOPY/URETEROSCOPY/HOLMIUM LASER/STENT PLACEMENT Left 08/06/2016   Procedure: CYSTOSCOPY/URETEROSCOPY/HOLMIUM LASER/STENT PLACEMENT;  Surgeon: Raynelle Bring, MD;  [facility];  Service: Urology;  Laterality: Left;  . CYSTOSCOPY/URETEROSCOPY/HOLMIUM LASER/STENT PLACEMENT Left 08/31/2016   Procedure: CYSTOSCOPY/URETEROSCOPY/HOLMIUM LASER/STENT PLACEMENT/BASKET STONE REMOVAL;  Surgeon: Raynelle Bring, MD;  [facility];  Service: Urology;  Laterality: Left;  . FEMORAL HERNIA REPAIR  1997   left leg  . LIVER TRANSPLANTATION    . NASAL SINUS SURGERY  1997   x3  . TONSILLECTOMY  age 44  . 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 TUBAL LIGATION       Family History: Family History  Adopted: Yes  Problem Relation Age of Onset  . Stroke Mother   . Alzheimer's disease Mother   . Cancer Mother   . Diabetes Mother        Questionable, per new patient packet   . Arthritis Mother        Questionable, per new patient packet   . Alcoholism Father   . COPD Brother        Heavy smoker, per new patient packet   . Suicidality Sister   . Depression Sister   . COPD Sister   . OCD Daughter   . Personality disorder Daughter   . Alcoholism Son     Social History: Social History   Socioeconomic History  . Marital status: Divorced    Spouse name: Not on file  . Number of children: 2  . Years of education: 41  . Highest education level: Not on file  Occupational History  . Occupation: Disabled  Social Needs  . Financial resource strain: Not hard at all  . Food insecurity    Worry: Never true    Inability: Never true  . Transportation needs    Medical: No    Non-medical: No  Tobacco Use  . Smoking status: Never Smoker   . Smokeless tobacco: Never Used  Substance and Sexual Activity  . Alcohol use: No  . Drug use: No  . Sexual activity: Never    Comment: 14 years ago  Lifestyle  . Physical activity    Days per week: 5 days    Minutes per session: 20 min  . Stress: To some extent  Relationships  . Social Herbalist on phone: Never    Gets together: Never    Attends religious service: Never    Active member of club or organization: No    Attends meetings of clubs or organizations: Never    Relationship status: Divorced  Other Topics Concern  . Not on file  Social History Narrative   Lives at alone alone.  Right-handed.   No caffeine use.      PSC- as of 10/12/17   Diet: No Fried Foods       Caffeine: Summer time, sweet tea       Married, if yes what year: Divorced       Do you live in a house, apartment, assisted living, condo, trailer, ect: [place], 1 stories, and 1 person       Pets: 1 dog       Current/Past profession: Retail buyer       Exercise: Some, walking dog       Living Will: yes   DNR: yes   POA/HPOA: yes      Functional Status: Patient did not answer   Do you have difficulty bathing or dressing yourself?   Do you have difficulty preparing food or eating?   Do you have difficulty managing your medications?   Do you have difficulty managing your finances?   Do you have difficulty affording your medications?    Allergies: Allergies  Allergen Reactions  . Codeine Nausea Only  . 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    Marland Kitchen Penicillins Nausea Only and Other (See Comments)    Has patient had a PCN reaction causing immediate rash, facial/tongue/throat swelling, SOB or lightheadedness with hypotension: No Has patient had a PCN reaction causing severe rash involving mucus membranes or skin necrosis: No Has patient had a PCN reaction that required hospitalization No Has patient had a PCN reaction occurring  within the last 10 years: No If all of the above answers are "NO", then may proceed with Cephalosporin use.  Marland Kitchen Phenergan [Promethazine] Other (See Comments)    Reaction:  Hallucinations  Pt states that she is only allergic to IV form.   . Sulfa Antibiotics Itching  . Tamsulosin Itching  . Lidocaine Palpitations    Outpatient Meds: Current Outpatient Medications  Medication Sig Dispense Refill  . acetaminophen (TYLENOL) 500 MG tablet Take 500-1,000 mg by mouth every 6 (six) hours as needed for mild pain, moderate pain or headache.     Marland Kitchen aspirin EC 81 MG tablet Take 81 mg by mouth daily.    . brimonidine (ALPHAGAN P) 0.1 % SOLN Place 1 drop into both eyes daily.     Marland Kitchen CALCIUM-VITAMIN D PO Take 1 tablet by mouth daily. (Vit D is 1000 iu)    . DULoxetine (CYMBALTA) 60 MG capsule Take 1 capsule (60 mg total) by mouth daily. 90 capsule 1  . Ferrous Sulfate (IRON) 325 (65 Fe) MG TABS Take 1 tablet by mouth daily.    . folic acid (FOLVITE) A999333 MCG tablet Take 400 mcg by mouth daily.    . hydrOXYzine (ATARAX/VISTARIL) 25 MG tablet Take 25 mg by mouth every 6 (six) hours as needed for itching.    . potassium chloride (K-DUR,KLOR-CON) 10 MEQ tablet Take 20 mEq by mouth daily.     Marland Kitchen pyridoxine (B-6) 100 MG tablet Take 100 mg by mouth daily.    . tacrolimus (PROGRAF) 0.5 MG capsule Take 4 by mouth at bedtime     No current facility-administered medications for this visit.       ___________________________________________________________________ Objective   Exam:  BP 120/70   Pulse 96   Temp 99 F (37.2 C) (Oral)   Ht 5\' 2"  (1.575 m)   Wt 145 lb (65.8 kg)   BMI 26.52 kg/m    General:  Well-appearing, pleasant, distracted thought pattern  Eyes: sclera anicteric, no redness  ENT: oral mucosa moist without lesions, no cervical or supraclavicular lymphadenopathy  CV: RRR without murmur, S1/S2, no JVD, no peripheral edema  Resp: clear to auscultation bilaterally, normal RR and effort  noted  GI: soft, no tenderness, with active bowel sounds. No guarding or palpable organomegaly noted.  Inverted "Y-shaped" incision from transplant.  Skin; warm and dry, no rash or jaundice noted  Neuro: awake, alert and oriented x 3. Normal gross motor function and fluent speech  Labs:  CBC Latest Ref Rng & Units 11/08/2018 07/12/2018 04/08/2018  WBC 3.8 - 10.8 Thousand/uL 8.9 8.8 8.0  Hemoglobin 11.7 - 15.5 g/dL 15.2 14.5 14.6  Hematocrit 35.0 - 45.0 % 44.3 41.7 42.3  Platelets 140 - 400 Thousand/uL 216 205 200   CMP Latest Ref Rng & Units 02/15/2019 12/23/2018 11/08/2018  Glucose 65 - 139 mg/dL 93 105(H) 108(H)  BUN 7 - 25 mg/dL 13 14 15   Creatinine 0.60 - 0.93 mg/dL 1.01(H) 1.10(H) 1.06(H)  Sodium 135 - 146 mmol/L 140 141 141  Potassium 3.5 - 5.3 mmol/L 4.0 4.0 4.4  Chloride 98 - 110 mmol/L 102 102 102  CO2 20 - 32 mmol/L 30 27 31   Calcium 8.6 - 10.4 mg/dL 9.2 9.8 10.2  Total Protein 6.1 - 8.1 g/dL 6.6 6.9 7.3  Total Bilirubin 0.2 - 1.2 mg/dL 0.5 0.4 0.6  Alkaline Phos 38 - 126 U/L - - -  AST 10 - 35 U/L 20 17 20   ALT 6 - 29 U/L 17 11 14    Assessment: Encounter Diagnoses  Name Primary?  . Change in bowel habits Yes  . Long term current use of immunosuppressive drug     Change in bowel habits possibly anatomical such as diverticulosis, pelvic floor dysfunction (especially with urinary symptoms), cystocele/rectocele, seems less likely overt or microscopic colitis. I was planning to do a rectal exam at the time of colonoscopy, but then she decided to put off the colonoscopy. Plan:  I recommended colonoscopy, but she would like to give it some more consideration, and says now is "not a good time".  She apparently has some personal issues to attend to, including selling her house.  I recommended 1 tablespoon of Citrucel a day to help bulk stool and give a more complete evacuation and also decrease chances of incontinence.  Thank you for the courtesy of this consult.  Please  call me with any questions or concerns.  Nelida Meuse III  CC: Referring provider noted above

## 2019-03-14 NOTE — Patient Instructions (Signed)
Please purchase the following medications over the counter and take as directed: Citrucel-1 tablespoon daily dissolved in at least 8 ounces water/juice daily to help with fecal incontinence and change in bowels.  It has been recommended to you by your physician that you have a(n) colonoscopy completed. Per your request, we did not schedule the procedure(s) today. Please contact our office at [phone] should you decide to have the procedure completed.  If you are age 50 or older, your body mass index should be between 23-30. Your Body mass index is 26.52 kg/m. If this is out of the aforementioned range listed, please consider follow up with your Primary Care Provider.  If you are age 20 or younger, your body mass index should be between 19-25. Your Body mass index is 26.52 kg/m. If this is out of the aformentioned range listed, please consider follow up with your Primary Care Provider.   It was a pleasure to see you today!  Dr. Loletha Carrow

## 2019-03-20 DIAGNOSIS — Z944 Liver transplant status: Secondary | ICD-10-CM

## 2019-03-20 DIAGNOSIS — E612 Magnesium deficiency: Secondary | ICD-10-CM

## 2019-03-20 DIAGNOSIS — Z5181 Encounter for therapeutic drug level monitoring: Secondary | ICD-10-CM

## 2019-03-27 DIAGNOSIS — Z944 Liver transplant status: Principal | ICD-10-CM

## 2019-03-27 DIAGNOSIS — E612 Magnesium deficiency: Principal | ICD-10-CM

## 2019-03-27 DIAGNOSIS — Z5181 Encounter for therapeutic drug level monitoring: Principal | ICD-10-CM

## 2019-03-29 DIAGNOSIS — F331 Major depressive disorder, recurrent, moderate: Secondary | ICD-10-CM | POA: Diagnosis not present

## 2019-03-29 DIAGNOSIS — F411 Generalized anxiety disorder: Secondary | ICD-10-CM | POA: Diagnosis not present

## 2019-04-03 DIAGNOSIS — Z5181 Encounter for therapeutic drug level monitoring: Principal | ICD-10-CM

## 2019-04-03 DIAGNOSIS — E612 Magnesium deficiency: Principal | ICD-10-CM

## 2019-04-03 DIAGNOSIS — Z944 Liver transplant status: Principal | ICD-10-CM

## 2019-04-05 DIAGNOSIS — Z87442 Personal history of urinary calculi: Secondary | ICD-10-CM | POA: Diagnosis not present

## 2019-04-10 DIAGNOSIS — Z5181 Encounter for therapeutic drug level monitoring: Principal | ICD-10-CM

## 2019-04-10 DIAGNOSIS — Z944 Liver transplant status: Principal | ICD-10-CM

## 2019-04-10 DIAGNOSIS — E612 Magnesium deficiency: Principal | ICD-10-CM

## 2019-04-12 DIAGNOSIS — Z87442 Personal history of urinary calculi: Secondary | ICD-10-CM | POA: Diagnosis not present

## 2019-04-14 DIAGNOSIS — Z79899 Other long term (current) drug therapy: Principal | ICD-10-CM

## 2019-04-14 DIAGNOSIS — Z944 Liver transplant status: Principal | ICD-10-CM

## 2019-04-14 MED ORDER — PROGRAF 0.5 MG CAPSULE
ORAL_CAPSULE | Freq: Two times a day (BID) | ORAL | 0 refills | 30.00000 days | Status: CP
Start: 2019-04-14 — End: ?

## 2019-04-17 DIAGNOSIS — Z5181 Encounter for therapeutic drug level monitoring: Principal | ICD-10-CM

## 2019-04-17 DIAGNOSIS — Z944 Liver transplant status: Principal | ICD-10-CM

## 2019-04-17 DIAGNOSIS — E612 Magnesium deficiency: Principal | ICD-10-CM

## 2019-04-18 ENCOUNTER — Other Ambulatory Visit: Payer: Self-pay | Admitting: Gastroenterology

## 2019-04-18 ENCOUNTER — Other Ambulatory Visit (HOSPITAL_COMMUNITY): Payer: Self-pay | Admitting: Gastroenterology

## 2019-04-24 DIAGNOSIS — Z944 Liver transplant status: Principal | ICD-10-CM

## 2019-04-24 DIAGNOSIS — Z5181 Encounter for therapeutic drug level monitoring: Principal | ICD-10-CM

## 2019-04-24 DIAGNOSIS — E612 Magnesium deficiency: Principal | ICD-10-CM

## 2019-05-01 DIAGNOSIS — F331 Major depressive disorder, recurrent, moderate: Secondary | ICD-10-CM | POA: Diagnosis not present

## 2019-05-01 DIAGNOSIS — Z944 Liver transplant status: Principal | ICD-10-CM

## 2019-05-01 DIAGNOSIS — Z5181 Encounter for therapeutic drug level monitoring: Principal | ICD-10-CM

## 2019-05-01 DIAGNOSIS — E612 Magnesium deficiency: Principal | ICD-10-CM

## 2019-05-01 DIAGNOSIS — F411 Generalized anxiety disorder: Secondary | ICD-10-CM | POA: Diagnosis not present

## 2019-05-02 ENCOUNTER — Other Ambulatory Visit: Payer: Self-pay | Admitting: Nurse Practitioner

## 2019-05-02 DIAGNOSIS — Z79899 Other long term (current) drug therapy: Secondary | ICD-10-CM

## 2019-05-02 DIAGNOSIS — Z944 Liver transplant status: Secondary | ICD-10-CM

## 2019-05-08 DIAGNOSIS — Z944 Liver transplant status: Principal | ICD-10-CM

## 2019-05-08 DIAGNOSIS — E612 Magnesium deficiency: Principal | ICD-10-CM

## 2019-05-08 DIAGNOSIS — Z5181 Encounter for therapeutic drug level monitoring: Principal | ICD-10-CM

## 2019-05-09 DIAGNOSIS — F331 Major depressive disorder, recurrent, moderate: Secondary | ICD-10-CM | POA: Diagnosis not present

## 2019-05-09 DIAGNOSIS — F411 Generalized anxiety disorder: Secondary | ICD-10-CM | POA: Diagnosis not present

## 2019-05-16 ENCOUNTER — Other Ambulatory Visit: Payer: Medicare Other

## 2019-05-16 ENCOUNTER — Other Ambulatory Visit: Payer: Self-pay

## 2019-05-16 DIAGNOSIS — Z944 Liver transplant status: Secondary | ICD-10-CM

## 2019-05-16 DIAGNOSIS — Z79899 Other long term (current) drug therapy: Secondary | ICD-10-CM | POA: Diagnosis not present

## 2019-05-16 DIAGNOSIS — C220 Liver cell carcinoma: Principal | ICD-10-CM

## 2019-05-17 LAB — CBC WITH DIFFERENTIAL/PLATELET
Absolute Monocytes: 518 cells/uL (ref 200–950)
Basophils Absolute: 58 cells/uL (ref 0–200)
Basophils Relative: 0.8 %
Eosinophils Absolute: 183 cells/uL (ref 15–500)
Eosinophils Relative: 2.5 %
HCT: 40.9 % (ref 35.0–45.0)
Hemoglobin: 14.1 g/dL (ref 11.7–15.5)
Lymphs Abs: 1716 cells/uL (ref 850–3900)
MCH: 30.9 pg (ref 27.0–33.0)
MCHC: 34.5 g/dL (ref 32.0–36.0)
MCV: 89.5 fL (ref 80.0–100.0)
MPV: 10.3 fL (ref 7.5–12.5)
Monocytes Relative: 7.1 %
Neutro Abs: 4825 cells/uL (ref 1500–7800)
Neutrophils Relative %: 66.1 %
Platelets: 191 10*3/uL (ref 140–400)
RBC: 4.57 10*6/uL (ref 3.80–5.10)
RDW: 13.4 % (ref 11.0–15.0)
Total Lymphocyte: 23.5 %
WBC: 7.3 10*3/uL (ref 3.8–10.8)

## 2019-05-17 LAB — BILIRUBIN, DIRECT: Bilirubin, Direct: 0.1 mg/dL (ref 0.0–0.2)

## 2019-05-17 LAB — GAMMA GT: GGT: 22 U/L (ref 3–65)

## 2019-05-17 LAB — COMPLETE METABOLIC PANEL WITH GFR
AG Ratio: 1.7 (calc) (ref 1.0–2.5)
ALT: 20 U/L (ref 6–29)
AST: 21 U/L (ref 10–35)
Albumin: 4.3 g/dL (ref 3.6–5.1)
Alkaline phosphatase (APISO): 64 U/L (ref 37–153)
BUN: 14 mg/dL (ref 7–25)
CO2: 27 mmol/L (ref 20–32)
Calcium: 9.2 mg/dL (ref 8.6–10.4)
Chloride: 103 mmol/L (ref 98–110)
Creat: 0.93 mg/dL (ref 0.60–0.93)
GFR, Est African American: 72 mL/min/{1.73_m2} (ref >=60)
GFR, Est Non African American: 62 mL/min/{1.73_m2} (ref >=60)
Globulin: 2.5 g/dL (calc) (ref 1.9–3.7)
Glucose, Bld: 96 mg/dL (ref 65–139)
Potassium: 3.4 mmol/L — ABNORMAL LOW (ref 3.5–5.3)
Sodium: 141 mmol/L (ref 135–146)
Total Bilirubin: 0.5 mg/dL (ref 0.2–1.2)
Total Protein: 6.8 g/dL (ref 6.1–8.1)

## 2019-05-17 LAB — PHOSPHORUS: Phosphorus: 4.3 mg/dL (ref 2.1–4.3)

## 2019-05-17 LAB — MAGNESIUM: Magnesium: 2 mg/dL (ref 1.5–2.5)

## 2019-05-17 LAB — TACROLIMUS LEVEL: Tacrolimus (FK506), Blood: 4.5 ng/mL — ABNORMAL LOW

## 2019-05-22 DIAGNOSIS — Z5181 Encounter for therapeutic drug level monitoring: Principal | ICD-10-CM

## 2019-05-22 DIAGNOSIS — E612 Magnesium deficiency: Principal | ICD-10-CM

## 2019-05-22 DIAGNOSIS — Z944 Liver transplant status: Principal | ICD-10-CM

## 2019-05-29 DIAGNOSIS — Z5181 Encounter for therapeutic drug level monitoring: Principal | ICD-10-CM

## 2019-05-29 DIAGNOSIS — E612 Magnesium deficiency: Principal | ICD-10-CM

## 2019-05-29 DIAGNOSIS — Z944 Liver transplant status: Principal | ICD-10-CM

## 2019-06-05 DIAGNOSIS — Z5181 Encounter for therapeutic drug level monitoring: Principal | ICD-10-CM

## 2019-06-05 DIAGNOSIS — Z944 Liver transplant status: Principal | ICD-10-CM

## 2019-06-05 DIAGNOSIS — E612 Magnesium deficiency: Principal | ICD-10-CM

## 2019-06-06 ENCOUNTER — Encounter: Payer: Self-pay | Admitting: Nurse Practitioner

## 2019-06-12 DIAGNOSIS — Z944 Liver transplant status: Principal | ICD-10-CM

## 2019-06-12 DIAGNOSIS — Z5181 Encounter for therapeutic drug level monitoring: Principal | ICD-10-CM

## 2019-06-12 DIAGNOSIS — E612 Magnesium deficiency: Principal | ICD-10-CM

## 2019-06-19 DIAGNOSIS — Z5181 Encounter for therapeutic drug level monitoring: Principal | ICD-10-CM

## 2019-06-19 DIAGNOSIS — Z944 Liver transplant status: Principal | ICD-10-CM

## 2019-06-19 DIAGNOSIS — E612 Magnesium deficiency: Principal | ICD-10-CM

## 2019-06-22 DIAGNOSIS — Z20828 Contact with and (suspected) exposure to other viral communicable diseases: Secondary | ICD-10-CM | POA: Diagnosis not present

## 2019-06-26 DIAGNOSIS — E612 Magnesium deficiency: Principal | ICD-10-CM

## 2019-06-26 DIAGNOSIS — Z944 Liver transplant status: Principal | ICD-10-CM

## 2019-06-26 DIAGNOSIS — Z5181 Encounter for therapeutic drug level monitoring: Principal | ICD-10-CM

## 2019-07-03 DIAGNOSIS — E612 Magnesium deficiency: Principal | ICD-10-CM

## 2019-07-03 DIAGNOSIS — Z5181 Encounter for therapeutic drug level monitoring: Principal | ICD-10-CM

## 2019-07-03 DIAGNOSIS — Z944 Liver transplant status: Principal | ICD-10-CM

## 2019-07-06 ENCOUNTER — Other Ambulatory Visit: Payer: Self-pay

## 2019-07-06 ENCOUNTER — Ambulatory Visit (INDEPENDENT_AMBULATORY_CARE_PROVIDER_SITE_OTHER): Payer: Medicare Other | Admitting: Nurse Practitioner

## 2019-07-06 DIAGNOSIS — R413 Other amnesia: Secondary | ICD-10-CM

## 2019-07-06 NOTE — Progress Notes (Signed)
.   This service is provided via telemedicine  No vital signs collected/recorded due to the encounter was a telemedicine visit.   Location of patient (ex: home, work):  Home  Patient consents to a telephone visit:  Yes  Location of the provider (ex: office, home):  [facility]   Name of any referring provider:  N/A  Names of all persons participating in the telemedicine service and their role in the encounter: Patient, Heriberto Antigua, RMA, Sherrie Mustache, NP.       Time spent on call:  16 minutes spent with Medical Ass    Careteam: Patient Care Team: Lauree Chandler, NP as PCP - General (Geriatric Medicine) Rolm Bookbinder, MD as Consulting Physician (Dermatology) Gerarda Fraction, MD as Referring Physician (Ophthalmology) Raynelle Bring, MD as Consulting Physician (Urology)  Advanced Directive information    Allergies  Allergen Reactions  . Codeine Nausea Only  . 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    Marland Kitchen Penicillins Nausea Only and Other (See Comments)    Has patient had a PCN reaction causing immediate rash, facial/tongue/throat swelling, SOB or lightheadedness with hypotension: No Has patient had a PCN reaction causing severe rash involving mucus membranes or skin necrosis: No Has patient had a PCN reaction that required hospitalization No Has patient had a PCN reaction occurring within the last 10 years: No If all of the above answers are "NO", then may proceed with Cephalosporin use.  Marland Kitchen Phenergan [Promethazine] Other (See Comments)    Reaction:  Hallucinations  Pt states that she is only allergic to IV form.   . Sulfa Antibiotics Itching  . Tamsulosin Itching  . Lidocaine Palpitations    Chief Complaint  Patient presents with  . Acute Visit    Memory Concern/Telehealth     HPI: Patient is a 74 y.o. female via telehealth due to more problems with her mind.  Reports her friend that is a Marine scientist is concerned.    States that she should be getting more things done and having trouble with this.  In the process of moving and having a hard time.  Every since her fall in february she has not been the same. Feels like she saw 2 neurologist during the hospital and they said everything was fine.  Since she had the fall in february feels like memory has been progressively worse.  Apparently she signed a contract with a relator that she does not want. Does not remember initialing the other papers either.  This was  2 months ago.   Last MMSE 28/30 in July 2020 In the middle of a lot of stress with moving.  She was supposed to follow up at [facility] for liver transplant but does not wish to follow up during [place].  Progressively getting worse over the last year. \  Had lost 30 lbs in a year but now moved and facility has food prepared and now she is eating much better.    Review of Systems:  Review of Systems  Constitutional: Positive for weight loss.  HENT: Negative for hearing loss.   Eyes:       No changes in vision. Has one eye Gets blurred vision when she is really tired but this is chronic  Respiratory: Negative for shortness of breath.   Cardiovascular: Negative for chest pain.  Musculoskeletal: Negative for joint pain and myalgias.  Neurological: Negative for dizziness, tingling, sensory change and headaches.  Psychiatric/Behavioral: Positive  for depression and memory loss.    Past Medical History:  Diagnosis Date  . Abnormal uterine bleeding   . Acid reflux   . Asthma    [phone] due to black mold house, no asthma now  . Broken ankle    right  . Broken rib    x2  . Cancer ([place])    liver (in the one that was removed)  . Central retinal vein occlusion few yrs   left eye retinal occlusion  . Complication of anesthesia    IV phenergan  serious mental reaction  . Depression   . Hepatitis C    HEP C  . Hernia, femoral    left  . History of blood transfusion 1971   acquired  hepatitis c  . History of kidney stones   . History of renal stone   . History of shingles   . Hypertension    due to profraf  . Kidney stones    x 4 per new patient packet   . Renal stone 11/2012  . Seasonal affective disorder ([place])   . Stroke [facility])    TIA x2 caused by prednisone  . Tailbone injury age 7 and age 52 and few weeks ago   broken x3  . TIA (transient ischemic attack)   . 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 CATARACT EXTRACTION     bilateral  . CHOLECYSTECTOMY  1997  . CYSTOSCOPY W/ URETERAL STENT PLACEMENT Left 08/14/2016   Procedure: CYSTOSCOPY WITH RETROGRADE PYELOGRAM/URETERAL LEFT URETEROSCOPY AND LEFT STENT PLACEMENT;  Surgeon: Raynelle Bring, MD;  [facility]: [facility];  Service: Urology;  Laterality: Left;  . CYSTOSCOPY/URETEROSCOPY/HOLMIUM LASER/STENT PLACEMENT Left 07/06/2016   Procedure: CYSTOSCOPY/URETEROSCOPY/ BILATERAL RETROGRADE/HOLMIUM LASER/STENT PLACEMENT/BASKET STONE REMOVAL;  Surgeon: Raynelle Bring, MD;  [facility];  Service: Urology;  Laterality: Left;  . CYSTOSCOPY/URETEROSCOPY/HOLMIUM LASER/STENT PLACEMENT Left 08/06/2016   Procedure: CYSTOSCOPY/URETEROSCOPY/HOLMIUM LASER/STENT PLACEMENT;  Surgeon: Raynelle Bring, MD;  [facility];  Service: Urology;  Laterality: Left;  . CYSTOSCOPY/URETEROSCOPY/HOLMIUM LASER/STENT PLACEMENT Left 08/31/2016   Procedure: CYSTOSCOPY/URETEROSCOPY/HOLMIUM LASER/STENT PLACEMENT/BASKET STONE REMOVAL;  Surgeon: Raynelle Bring, MD;  [facility];  Service: Urology;  Laterality: Left;  . FEMORAL HERNIA REPAIR  1997   left leg  . LIVER TRANSPLANTATION    . NASAL SINUS SURGERY  1997   x3  . TONSILLECTOMY  age 89  . 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 TUBAL LIGATION     Social History:   reports that she has never smoked. She has never used smokeless tobacco. She reports that she does not drink alcohol or use  drugs.  Family History  Adopted: Yes  Problem Relation Age of Onset  . Stroke Mother   . Alzheimer's disease Mother   . Cancer Mother   . Diabetes Mother        Questionable, per new patient packet   . Arthritis Mother        Questionable, per new patient packet   . Alcoholism Father   . COPD Brother        Heavy smoker, per new patient packet   . Suicidality Sister   . Depression Sister   . COPD Sister   . OCD Daughter   . Personality disorder Daughter   . Alcoholism Son     Medications: Patient's Medications  New Prescriptions   No medications on file  Previous Medications   ACETAMINOPHEN (TYLENOL) 500 MG TABLET    Take 500-1,000 mg by mouth every 6 (six) hours as needed for mild pain, moderate pain or headache.    ASPIRIN EC 81 MG TABLET    Take 81 mg by mouth daily.   BRIMONIDINE (ALPHAGAN P) 0.1 % SOLN    Place 1 drop into both eyes daily.    DULOXETINE (CYMBALTA) 60 MG CAPSULE    Take 1 capsule (60 mg total) by mouth daily.   FERROUS SULFATE (IRON) 325 (65 FE) MG TABS    Take 1 tablet by mouth daily.   FOLIC ACID (FOLVITE) A999333 MCG TABLET    Take 400 mcg by mouth daily.   HYDROXYZINE (ATARAX/VISTARIL) 25 MG TABLET    Take 25 mg by mouth every 6 (six) hours as needed for itching.   TACROLIMUS (PROGRAF) 0.5 MG CAPSULE    Take 4 by mouth at bedtime  Modified Medications   No medications on file  Discontinued Medications   CALCIUM-VITAMIN D PO    Take 1 tablet by mouth daily. (Vit D is 1000 iu)   POTASSIUM CHLORIDE (K-DUR,KLOR-CON) 10 MEQ TABLET    Take 20 mEq by mouth daily.    PYRIDOXINE (B-6) 100 MG TABLET    Take 100 mg by mouth daily.    Physical Exam:  There were no vitals filed for this visit. There is no height or weight on file to calculate BMI. Wt Readings from Last 3 Encounters:  03/14/19 145 lb (65.8 kg)  02/15/19 146 lb 9.6 oz (66.5 kg)  12/23/18 143 lb (64.9 kg)    Labs reviewed: Basic Metabolic Panel: Recent Labs    12/23/18 0824  02/15/19 1542 05/16/19 1105  NA 141 140 141  K 4.0 4.0 3.4*  CL 102 102 103  CO2 27 30 27   GLUCOSE 105* 93 96  BUN 14 13 14   CREATININE 1.10* 1.01* 0.93  CALCIUM 9.8 9.2 9.2  MG  --   --  2.0  PHOS  --   --  4.3   Liver Function Tests: Recent Labs    12/23/18 0824 02/15/19 1542 05/16/19 1105  AST 17 20 21   ALT 11 17 20   BILITOT 0.4 0.5 0.5  PROT 6.9 6.6 6.8   No results for input(s): LIPASE, AMYLASE in the last 8760 hours. No results for input(s): AMMONIA in the last 8760 hours. CBC: Recent Labs    07/12/18 1605 11/08/18 0836 05/16/19 1105  WBC 8.8 8.9 7.3  NEUTROABS 5,465 5,474 4,825  HGB 14.5 15.2 14.1  HCT 41.7 44.3 40.9  MCV 88.7 89.1 89.5  PLT 205 216 191   Lipid Panel: Recent Labs    11/08/18 0836 12/23/18 0824  CHOL 236* 213*  HDL 62 54  LDLCALC 156* 138*  TRIG 78 106  CHOLHDL 3.8 3.9   TSH: No results for input(s): TSH in the last 8760 hours. A1C: Lab Results  Component Value Date   HGBA1C 5.3 12/23/2018     Assessment/Plan 1. Memory loss -pt feels like her memory has progressively gotten worse in the last year since her fall when she hit her face. Most recently signed a contract she does not remember signing.  Friends have been concerned as well.  -she has had a lot of new stress and working through a move which could be contributing.  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 -recent recall good during tele-visit,  - Ambulatory referral to Neurology for further evaluation at this time.   Next appt: 08/16/2019 as schedule Ollen Rao K. Harle Battiest  [facility] [phone] [place].    Virtual Visit via Telephone Note  I connected with pt on 07/06/19 at  2:15 PM EST by  telephone and verified that I am speaking with the correct person using two identifiers.  Location: Patient: home Provider: [facility]    I discussed the limitations, risks, security and privacy concerns of performing an evaluation and management service by telephone and the availability of in person appointments. I also discussed with the patient that there may be a patient responsible charge related to this service. The patient expressed understanding and agreed to proceed.   I discussed the assessment and treatment plan with the patient. The patient was provided an opportunity to ask questions and all were answered. The patient agreed with the plan and demonstrated an understanding of the instructions.   The patient was advised to call back or seek an in-person evaluation if the symptoms worsen or if the condition fails to improve as anticipated.  I provided 25 minutes of non-face-to-face time during this encounter.  Carlos American. Harle Battiest Avs printed and mailed

## 2019-07-10 DIAGNOSIS — Z944 Liver transplant status: Principal | ICD-10-CM

## 2019-07-10 DIAGNOSIS — E612 Magnesium deficiency: Principal | ICD-10-CM

## 2019-07-10 DIAGNOSIS — Z5181 Encounter for therapeutic drug level monitoring: Principal | ICD-10-CM

## 2019-07-17 DIAGNOSIS — Z944 Liver transplant status: Principal | ICD-10-CM

## 2019-07-17 DIAGNOSIS — Z5181 Encounter for therapeutic drug level monitoring: Principal | ICD-10-CM

## 2019-07-17 DIAGNOSIS — E612 Magnesium deficiency: Principal | ICD-10-CM

## 2019-07-21 DIAGNOSIS — F331 Major depressive disorder, recurrent, moderate: Secondary | ICD-10-CM | POA: Diagnosis not present

## 2019-07-21 DIAGNOSIS — F411 Generalized anxiety disorder: Secondary | ICD-10-CM | POA: Diagnosis not present

## 2019-07-24 DIAGNOSIS — E612 Magnesium deficiency: Principal | ICD-10-CM

## 2019-07-24 DIAGNOSIS — Z944 Liver transplant status: Principal | ICD-10-CM

## 2019-07-24 DIAGNOSIS — Z5181 Encounter for therapeutic drug level monitoring: Principal | ICD-10-CM

## 2019-07-31 DIAGNOSIS — Z944 Liver transplant status: Principal | ICD-10-CM

## 2019-07-31 DIAGNOSIS — Z5181 Encounter for therapeutic drug level monitoring: Principal | ICD-10-CM

## 2019-07-31 DIAGNOSIS — E612 Magnesium deficiency: Principal | ICD-10-CM

## 2019-08-07 DIAGNOSIS — Z944 Liver transplant status: Principal | ICD-10-CM

## 2019-08-07 DIAGNOSIS — Z5181 Encounter for therapeutic drug level monitoring: Principal | ICD-10-CM

## 2019-08-07 DIAGNOSIS — E612 Magnesium deficiency: Principal | ICD-10-CM

## 2019-08-14 DIAGNOSIS — E612 Magnesium deficiency: Principal | ICD-10-CM

## 2019-08-14 DIAGNOSIS — Z5181 Encounter for therapeutic drug level monitoring: Principal | ICD-10-CM

## 2019-08-14 DIAGNOSIS — Z944 Liver transplant status: Principal | ICD-10-CM

## 2019-08-16 ENCOUNTER — Telehealth (INDEPENDENT_AMBULATORY_CARE_PROVIDER_SITE_OTHER): Payer: Medicare Other | Admitting: Nurse Practitioner

## 2019-08-16 ENCOUNTER — Encounter: Payer: Self-pay | Admitting: Nurse Practitioner

## 2019-08-16 ENCOUNTER — Other Ambulatory Visit: Payer: Self-pay

## 2019-08-16 DIAGNOSIS — N201 Calculus of ureter: Secondary | ICD-10-CM

## 2019-08-16 DIAGNOSIS — E782 Mixed hyperlipidemia: Secondary | ICD-10-CM

## 2019-08-16 DIAGNOSIS — F339 Major depressive disorder, recurrent, unspecified: Secondary | ICD-10-CM

## 2019-08-16 DIAGNOSIS — R413 Other amnesia: Secondary | ICD-10-CM

## 2019-08-16 DIAGNOSIS — E559 Vitamin D deficiency, unspecified: Secondary | ICD-10-CM | POA: Diagnosis not present

## 2019-08-16 DIAGNOSIS — K219 Gastro-esophageal reflux disease without esophagitis: Secondary | ICD-10-CM

## 2019-08-16 DIAGNOSIS — I10 Essential (primary) hypertension: Secondary | ICD-10-CM | POA: Diagnosis not present

## 2019-08-16 DIAGNOSIS — Z944 Liver transplant status: Secondary | ICD-10-CM

## 2019-08-16 DIAGNOSIS — E2839 Other primary ovarian failure: Secondary | ICD-10-CM | POA: Diagnosis not present

## 2019-08-16 NOTE — Patient Instructions (Signed)
To increase calcium to twice daily To take Vit D 1000 units daily

## 2019-08-16 NOTE — Progress Notes (Signed)
This service is provided via telemedicine  No vital signs collected/recorded due to the encounter was a telemedicine visit.   Location of patient (ex: home, work):  Home  Patient consents to a telephone visit:  Yes  Location of the provider (ex: office, home):  [facility], Office   Name of any referring provider:  N/A  Names of all persons participating in the telemedicine service and their role in the encounter:  S.Chrae B/CMA, Sherrie Mustache, NP, and Patient   Time spent on call:  13 min with medical assistant      Careteam: Patient Care Team: Lauree Chandler, NP as PCP - General (Geriatric Medicine) Rolm Bookbinder, MD as Consulting Physician (Dermatology) Gerarda Fraction, MD as Referring Physician (Ophthalmology) Raynelle Bring, MD as Consulting Physician (Urology)  Advanced Directive information Does Patient Have a Medical Advance Directive?: Yes, Type of Advance Directive: [place];Living will, Does patient want to make changes to medical advance directive?: No - Patient declined  Allergies  Allergen Reactions  . Codeine Nausea Only  . 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    Marland Kitchen Penicillins Nausea Only and Other (See Comments)    Has patient had a PCN reaction causing immediate rash, facial/tongue/throat swelling, SOB or lightheadedness with hypotension: No Has patient had a PCN reaction causing severe rash involving mucus membranes or skin necrosis: No Has patient had a PCN reaction that required hospitalization No Has patient had a PCN reaction occurring within the last 10 years: No If all of the above answers are "NO", then may proceed with Cephalosporin use.  Marland Kitchen Phenergan [Promethazine] Other (See Comments)    Reaction:  Hallucinations  Pt states that she is only allergic to IV form.   . Sulfa Antibiotics Itching  . Tamsulosin Itching  . Lidocaine Palpitations    Chief Complaint    Patient presents with  . Medical Management of Chronic Issues    6 month follow-up. High fall risk. Telehealth/Video Visit   . Advanced Directive    Discuss different types of Advance Directives     HPI: Patient is a 74 y.o. female for routine follow up via telephone visit= just moved and does not have Internet.   She did a telephone visit in January for memory loss- neurology consult was placed- she has appt in April.   Seeing a new psychiatrist for anxiety and depression- feels like this has been helpful.  Continues conts on duloxetine now on 60 mg daily  In a good place currently. Eating better.  Has moved into [place]- senior independent living. Had to sell her home to live. Stress with family. Now in a positive place.   Hyperlipidemia- not on any medication but doing better with diet.   GERD- stable at this time, new diet.   Vit D def- taking vit d in her cal supplement   Anemia- using supplement daily, hgb at goal.   She was trying to play ping pong and had a fall- no injury   S/p liver transplant- "too busy to follow up" and she does not wish to go in due to [place].   Had both COVID shots. Reports she had fever and malaise for 3 days.   Review of Systems:  Review of Systems  Constitutional: Negative for chills, fever and weight loss.  HENT: Negative for tinnitus.   Respiratory: Negative for cough, sputum production and shortness of breath.  Cardiovascular: Negative for chest pain, palpitations and leg swelling.  Gastrointestinal: Negative for abdominal pain, constipation, diarrhea and heartburn.  Genitourinary: Negative for dysuria, frequency and urgency.  Musculoskeletal: Positive for falls. Negative for back pain, joint pain and myalgias.  Skin: Negative.   Neurological: Negative for dizziness and headaches.  Psychiatric/Behavioral: Positive for depression and memory loss. The patient is nervous/anxious. The patient does not have insomnia.      Past Medical History:  Diagnosis Date  . Abnormal uterine bleeding   . Acid reflux   . Asthma    [phone] due to black mold house, no asthma now  . Broken ankle    right  . Broken rib    x2  . Cancer ([place])    liver (in the one that was removed)  . Central retinal vein occlusion few yrs   left eye retinal occlusion  . Complication of anesthesia    IV phenergan  serious mental reaction  . Depression   . Hepatitis C    HEP C  . Hernia, femoral    left  . History of blood transfusion 1971   acquired hepatitis c  . History of kidney stones   . History of renal stone   . History of shingles   . Hypertension    due to profraf  . Kidney stones    x 4 per new patient packet   . Renal stone 11/2012  . Seasonal affective disorder ([place])   . Stroke [facility])    TIA x2 caused by prednisone  . Tailbone injury age 11 and age 37 and few weeks ago   broken x3  . TIA (transient ischemic attack)   . 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 CATARACT EXTRACTION     bilateral  . CHOLECYSTECTOMY  1997  . CYSTOSCOPY W/ URETERAL STENT PLACEMENT Left 08/14/2016   Procedure: CYSTOSCOPY WITH RETROGRADE PYELOGRAM/URETERAL LEFT URETEROSCOPY AND LEFT STENT PLACEMENT;  Surgeon: Raynelle Bring, MD;  [facility];  Service: Urology;  Laterality: Left;  . CYSTOSCOPY/URETEROSCOPY/HOLMIUM LASER/STENT PLACEMENT Left 07/06/2016   Procedure: CYSTOSCOPY/URETEROSCOPY/ BILATERAL RETROGRADE/HOLMIUM LASER/STENT PLACEMENT/BASKET STONE REMOVAL;  Surgeon: Raynelle Bring, MD;  [facility];  Service: Urology;  Laterality: Left;  . CYSTOSCOPY/URETEROSCOPY/HOLMIUM LASER/STENT PLACEMENT Left 08/06/2016   Procedure: CYSTOSCOPY/URETEROSCOPY/HOLMIUM LASER/STENT PLACEMENT;  Surgeon: Raynelle Bring, MD;  [facility];  Service: Urology;  Laterality: Left;  . CYSTOSCOPY/URETEROSCOPY/HOLMIUM LASER/STENT  PLACEMENT Left 08/31/2016   Procedure: CYSTOSCOPY/URETEROSCOPY/HOLMIUM LASER/STENT PLACEMENT/BASKET STONE REMOVAL;  Surgeon: Raynelle Bring, MD;  [facility];  Service: Urology;  Laterality: Left;  . FEMORAL HERNIA REPAIR  1997   left leg  . LIVER TRANSPLANTATION    . NASAL SINUS SURGERY  1997   x3  . TONSILLECTOMY  age 41  . 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 TUBAL LIGATION     Social History:   reports that she has never smoked. She has never used smokeless tobacco. She reports that she does not drink alcohol or use drugs.  Family History  Adopted: Yes  Problem Relation Age of Onset  . Stroke Mother   . Alzheimer's disease Mother   . Cancer Mother   . Diabetes Mother        Questionable, per new patient packet   . Arthritis Mother        Questionable, per new patient packet   . Alcoholism Father   .  COPD Brother        Heavy smoker, per new patient packet   . Suicidality Sister   . Depression Sister   . COPD Sister   . OCD Daughter   . Personality disorder Daughter   . Alcoholism Son     Medications: Patient's Medications  New Prescriptions   No medications on file  Previous Medications   ACETAMINOPHEN (TYLENOL) 500 MG TABLET    Take 500-1,000 mg by mouth every 6 (six) hours as needed for mild pain, moderate pain or headache.    ASPIRIN EC 81 MG TABLET    Take 81 mg by mouth daily.   BRIMONIDINE (ALPHAGAN P) 0.1 % SOLN    Place 1 drop into both eyes daily.    DULOXETINE (CYMBALTA) 60 MG CAPSULE    Take 1 capsule (60 mg total) by mouth daily.   FERROUS SULFATE (IRON) 325 (65 FE) MG TABS    Take 1 tablet by mouth daily.   FOLIC ACID (FOLVITE) A999333 MCG TABLET    Take 400 mcg by mouth daily.   HYDROXYZINE (ATARAX/VISTARIL) 25 MG TABLET    Take 25 mg by mouth every 6 (six) hours as needed for itching.   TACROLIMUS (PROGRAF) 0.5 MG CAPSULE    Take 4 by mouth at bedtime  Modified Medications   No medications on file  Discontinued Medications   No medications on file     Physical Exam:  There were no vitals filed for this visit. There is no height or weight on file to calculate BMI. Wt Readings from Last 3 Encounters:  03/14/19 145 lb (65.8 kg)  02/15/19 146 lb 9.6 oz (66.5 kg)  12/23/18 143 lb (64.9 kg)     Labs reviewed: Basic Metabolic Panel: Recent Labs    12/23/18 0824 02/15/19 1542 05/16/19 1105  NA 141 140 141  K 4.0 4.0 3.4*  CL 102 102 103  CO2 27 30 27   GLUCOSE 105* 93 96  BUN 14 13 14   CREATININE 1.10* 1.01* 0.93  CALCIUM 9.8 9.2 9.2  MG  --   --  2.0  PHOS  --   --  4.3   Liver Function Tests: Recent Labs    12/23/18 0824 02/15/19 1542 05/16/19 1105  AST 17 20 21   ALT 11 17 20   BILITOT 0.4 0.5 0.5  PROT 6.9 6.6 6.8   No results for input(s): LIPASE, AMYLASE in the last 8760 hours. No results for input(s): AMMONIA in the last 8760 hours. CBC: Recent Labs    11/08/18 0836 05/16/19 1105  WBC 8.9 7.3  NEUTROABS 5,474 4,825  HGB 15.2 14.1  HCT 44.3 40.9  MCV 89.1 89.5  PLT 216 191   Lipid Panel: Recent Labs    11/08/18 0836 12/23/18 0824  CHOL 236* 213*  HDL 62 54  LDLCALC 156* 138*  TRIG 78 106  CHOLHDL 3.8 3.9   TSH: No results for input(s): TSH in the last 8760 hours. A1C: Lab Results  Component Value Date   HGBA1C 5.3 12/23/2018     Assessment/Plan 1. Estrogen deficiency - DG Bone Density; Future  2. Liver replaced by transplant [facility]) -does not wish to go into office at this time. Reports they are recommending biopsy but she declines.  3. Vitamin D deficiency Encouraged to add vit D 1000 units daily  4. Depression, recurrent ([place]) Ongoing and stable, continues on Cymbalta following with psych services at this time.   5. Mixed hyperlipidemia -diet controlled, will follow  up lipids.  6. Ureteral stone No recent symptoms, continues on potassium supplement   7. Essential hypertension Diet controlled, has not taken blood pressure. Unable today due to telephone visit.   Recommended to notify for bp >140/90  8. Memory loss -ongoing, repeats herself frequently during visit. Has appt scheduled with neurologist for evaluation per request.  -will get lab work for evaluation at this time -cbc, cmp, tsh, b12, rpr  9. Gastroesophageal reflux disease, unspecified whether esophagitis present Controlled with dietary modifications.   Next appt: 6 months.  Carlos American. Harle Battiest  [facility] [phone]    Virtual Visit via Telephone Note  I connected with@ on 08/16/19 at  1:00 PM EST by telephone and verified that I am speaking with the correct person using two identifiers.  Location: Patient: home Provider: office    I discussed the limitations, risks, security and privacy concerns of performing an evaluation and management service by telephone and the availability of in person appointments. I also discussed with the patient that there may be a patient responsible charge related to this service. The patient expressed understanding and agreed to proceed.   I discussed the assessment and treatment plan with the patient. The patient was provided an opportunity to ask questions and all were answered. The patient agreed with the plan and demonstrated an understanding of the instructions.   The patient was advised to call back or seek an in-person evaluation if the symptoms worsen or if the condition fails to improve as anticipated.  I provided 30 minutes of non-face-to-face time during this encounter.  Carlos American. Harle Battiest Avs printed and mailed

## 2019-08-21 DIAGNOSIS — E612 Magnesium deficiency: Principal | ICD-10-CM

## 2019-08-21 DIAGNOSIS — Z5181 Encounter for therapeutic drug level monitoring: Principal | ICD-10-CM

## 2019-08-21 DIAGNOSIS — Z944 Liver transplant status: Principal | ICD-10-CM

## 2019-08-22 ENCOUNTER — Other Ambulatory Visit: Payer: Medicare Other

## 2019-08-28 ENCOUNTER — Telehealth: Payer: Self-pay | Admitting: Nurse Practitioner

## 2019-08-28 NOTE — Telephone Encounter (Signed)
Left voice message for patient to call and reschedule lab appointment that she missed on 08/22/2019.

## 2019-09-06 ENCOUNTER — Other Ambulatory Visit: Payer: Self-pay

## 2019-09-06 ENCOUNTER — Other Ambulatory Visit: Payer: Medicare Other

## 2019-09-06 DIAGNOSIS — E782 Mixed hyperlipidemia: Secondary | ICD-10-CM | POA: Diagnosis not present

## 2019-09-06 DIAGNOSIS — R413 Other amnesia: Secondary | ICD-10-CM | POA: Diagnosis not present

## 2019-09-07 ENCOUNTER — Encounter: Payer: Self-pay | Admitting: *Deleted

## 2019-09-07 LAB — CBC WITH DIFFERENTIAL/PLATELET
Absolute Monocytes: 482 cells/uL (ref 200–950)
Basophils Absolute: 51 cells/uL (ref 0–200)
Basophils Relative: 0.7 %
Eosinophils Absolute: 204 cells/uL (ref 15–500)
Eosinophils Relative: 2.8 %
HCT: 44 % (ref 35.0–45.0)
Hemoglobin: 14.9 g/dL (ref 11.7–15.5)
Lymphs Abs: 1723 cells/uL (ref 850–3900)
MCH: 30.8 pg (ref 27.0–33.0)
MCHC: 33.9 g/dL (ref 32.0–36.0)
MCV: 91.1 fL (ref 80.0–100.0)
MPV: 10.1 fL (ref 7.5–12.5)
Monocytes Relative: 6.6 %
Neutro Abs: 4840 cells/uL (ref 1500–7800)
Neutrophils Relative %: 66.3 %
Platelets: 191 10*3/uL (ref 140–400)
RBC: 4.83 10*6/uL (ref 3.80–5.10)
RDW: 13.9 % (ref 11.0–15.0)
Total Lymphocyte: 23.6 %
WBC: 7.3 10*3/uL (ref 3.8–10.8)

## 2019-09-07 LAB — COMPLETE METABOLIC PANEL WITH GFR
AG Ratio: 1.8 (calc) (ref 1.0–2.5)
ALT: 16 U/L (ref 6–29)
AST: 19 U/L (ref 10–35)
Albumin: 4.5 g/dL (ref 3.6–5.1)
Alkaline phosphatase (APISO): 60 U/L (ref 37–153)
BUN/Creatinine Ratio: 12 (calc) (ref 6–22)
BUN: 13 mg/dL (ref 7–25)
CO2: 30 mmol/L (ref 20–32)
Calcium: 9.4 mg/dL (ref 8.6–10.4)
Chloride: 103 mmol/L (ref 98–110)
Creat: 1.08 mg/dL — ABNORMAL HIGH (ref 0.60–0.93)
GFR, Est African American: 60 mL/min/{1.73_m2} (ref >=60)
GFR, Est Non African American: 52 mL/min/{1.73_m2} — ABNORMAL LOW (ref >=60)
Globulin: 2.5 g/dL (calc) (ref 1.9–3.7)
Glucose, Bld: 104 mg/dL — ABNORMAL HIGH (ref 65–99)
Potassium: 3.9 mmol/L (ref 3.5–5.3)
Sodium: 142 mmol/L (ref 135–146)
Total Bilirubin: 0.6 mg/dL (ref 0.2–1.2)
Total Protein: 7 g/dL (ref 6.1–8.1)

## 2019-09-07 LAB — RPR: RPR Ser Ql: NONREACTIVE

## 2019-09-07 LAB — TSH: TSH: 2.98 mIU/L (ref 0.40–4.50)

## 2019-09-07 LAB — LIPID PANEL
Cholesterol: 208 mg/dL — ABNORMAL HIGH (ref <200)
HDL: 56 mg/dL (ref >=50)
LDL Cholesterol (Calc): 134 mg/dL (calc) — ABNORMAL HIGH
Non-HDL Cholesterol (Calc): 152 mg/dL (calc) — ABNORMAL HIGH (ref <130)
Total CHOL/HDL Ratio: 3.7 (calc) (ref <5.0)
Triglycerides: 83 mg/dL (ref <150)

## 2019-09-07 LAB — VITAMIN B12: Vitamin B-12: 248 pg/mL (ref 200–1100)

## 2019-09-13 ENCOUNTER — Ambulatory Visit: Payer: Medicare Other | Admitting: Neurology

## 2019-09-14 ENCOUNTER — Ambulatory Visit: Payer: Medicare Other | Admitting: Neurology

## 2019-11-10 ENCOUNTER — Telehealth: Payer: Self-pay

## 2019-11-10 NOTE — Telephone Encounter (Signed)
I think this can wait for Janett Billow who knows this patient well.

## 2019-11-10 NOTE — Telephone Encounter (Signed)
Patient called with concerns about transplant patients and the COVID vaccine because of things she has heard on the news. She left a vm saying they said it would counteract with her medications she takes for her transplant. Patient would like to know if there has been any information that you are aware of concerning this.

## 2019-11-13 NOTE — Telephone Encounter (Signed)
I have not heard of anything but would recommend her reaching out to her transplant/liver doctors. She can also talk with a pharmacist

## 2019-11-14 NOTE — Telephone Encounter (Signed)
Called patient and informed of Jessica's recommendations Pt. Stated she will call her transplant team.

## 2019-11-20 ENCOUNTER — Encounter: Payer: Self-pay | Admitting: Family

## 2019-11-20 ENCOUNTER — Ambulatory Visit
Admission: RE | Admit: 2019-11-20 | Discharge: 2019-11-20 | Disposition: A | Discharge status: Home / Self Care | Payer: Medicare Other | Source: Ambulatory Visit | Attending: Family | Admitting: Family

## 2019-11-20 ENCOUNTER — Ambulatory Visit (INDEPENDENT_AMBULATORY_CARE_PROVIDER_SITE_OTHER): Payer: Medicare Other | Admitting: Family

## 2019-11-20 ENCOUNTER — Other Ambulatory Visit: Payer: Self-pay

## 2019-11-20 VITALS — BP 144/98 | HR 83 | Temp 97.7°F | Resp 16 | Ht 62.0 in | Wt 147.4 lb

## 2019-11-20 DIAGNOSIS — R42 Dizziness and giddiness: Secondary | ICD-10-CM | POA: Diagnosis not present

## 2019-11-20 DIAGNOSIS — M25511 Pain in right shoulder: Secondary | ICD-10-CM | POA: Diagnosis not present

## 2019-11-20 MED ORDER — TACROLIMUS 0.5 MG PO CAPS: ORAL_CAPSULE | ORAL | 1 refills | Status: DC

## 2019-11-20 MED ORDER — DICLOFENAC SODIUM 1 % EX GEL: 2.0000 g | Freq: Four times a day (QID) | CUTANEOUS | 1 refills | Status: DC

## 2019-11-20 NOTE — Patient Instructions (Addendum)
-   please get right shoulder X-ray at [address] at [email or web address] will call with results. - Apply Voltaren gel one application four times daily as needed for pain

## 2019-11-20 NOTE — Progress Notes (Signed)
Provider: Deannie Resetar FNP-C  Lauree Chandler, NP  Patient Care Team: Lauree Chandler, NP as PCP - General (Geriatric Medicine) Rolm Bookbinder, MD as Consulting Physician (Dermatology) Gerarda Fraction, MD as Referring Physician (Ophthalmology) Raynelle Bring, MD as Consulting Physician (Urology)  Extended Emergency Contact Information Primary Emergency Contact: Randa Spike [facility] Phone: [phone] Work Phone: [phone] Mobile Phone: [phone] Relation: Daughter  Code Status:  Full Code  Goals of care: Advanced Directive information Advanced Directives 11/20/2019  Does Patient Have a Medical Advance Directive? No  Type of Advance Directive -  Does patient want to make changes to medical advance directive? -  Copy of [place] in Chart? -  Would patient like information on creating a medical advance directive? No - Patient declined  Pre-existing out of facility DNR order (yellow form or pink MOST form) -     Chief Complaint  Patient presents with  . Acute Visit    Right shoulder pain    HPI:  Pt is a 74 y.o. female seen today for an acute visit for evaluation of right shoulder pain for the past last two weeks.Had similar symptoms cannot sleep due to pain.Has take Tylenol extra 1000 mg tablet as needed with very little relief.she describes pain as throbbing pain.she is unable to use hand.she was diagnosed with bursitis in the past.  [place] down her face a year ago.Has had dizziness since then.had X-ray done showed broken nose " blood was shooting up to the sky saw Dr.Akins.Dizzines is intermittent.Has had vertigo in the past.If she moves faster than normal dizziness worst.no changes No changes in vision.No nausea or vomiting.states has upcoming appointment Dr.Atkins in the past Telephone # [phone]. History limited due to memory.   Past Medical History:  Diagnosis Date  . Abnormal uterine bleeding   . Acid reflux   . Asthma    [phone] due to black mold house, no asthma now  . Broken ankle    right  . Broken rib    x2  . Cancer ([place])    liver (in the one that was removed)  . Central retinal vein occlusion few yrs   left eye retinal occlusion  . Complication of anesthesia    IV phenergan  serious mental reaction  . Depression   . Hepatitis C    HEP C  . Hernia, femoral    left  . History of blood transfusion 1971   acquired hepatitis c  . History of kidney stones   . History of renal stone   . History of shingles   . Hypertension    due to profraf  . Kidney stones    x 4 per new patient packet   . Renal stone 11/2012  . Seasonal affective disorder ([place])   . 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 Tailbone injury age 23 and age 80 and few weeks ago   broken x3  . TIA (transient ischemic attack)   . 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 CATARACT EXTRACTION     bilateral  . CHOLECYSTECTOMY  1997  . CYSTOSCOPY W/ URETERAL STENT PLACEMENT Left 08/14/2016   Procedure: CYSTOSCOPY WITH RETROGRADE PYELOGRAM/URETERAL LEFT URETEROSCOPY AND LEFT STENT PLACEMENT;  Surgeon: Raynelle Bring, MD;  Location: [facility];  Service: Urology;  Laterality: Left;  . CYSTOSCOPY/URETEROSCOPY/HOLMIUM LASER/STENT PLACEMENT Left 07/06/2016  Procedure: CYSTOSCOPY/URETEROSCOPY/ BILATERAL RETROGRADE/HOLMIUM LASER/STENT PLACEMENT/BASKET STONE REMOVAL;  Surgeon: Raynelle Bring, MD;  Location: [facility];  Service: Urology;  Laterality: Left;  . CYSTOSCOPY/URETEROSCOPY/HOLMIUM LASER/STENT PLACEMENT Left 08/06/2016   Procedure: CYSTOSCOPY/URETEROSCOPY/HOLMIUM LASER/STENT PLACEMENT;  Surgeon: Raynelle Bring, MD;  Location: [facility];  Service: Urology;  Laterality: Left;  . CYSTOSCOPY/URETEROSCOPY/HOLMIUM LASER/STENT PLACEMENT Left 08/31/2016   Procedure: CYSTOSCOPY/URETEROSCOPY/HOLMIUM LASER/STENT PLACEMENT/BASKET STONE  REMOVAL;  Surgeon: Raynelle Bring, MD;  Location: [facility];  Service: Urology;  Laterality: Left;  . FEMORAL HERNIA REPAIR  1997   left leg  . LIVER TRANSPLANTATION    . NASAL SINUS SURGERY  1997   x3  . TONSILLECTOMY  age 59  . 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 TUBAL LIGATION      Allergies  Allergen Reactions  . Codeine Nausea Only  . 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    Marland Kitchen Penicillins Nausea Only and Other (See Comments)    Has patient had a PCN reaction causing immediate rash, facial/tongue/throat swelling, SOB or lightheadedness with hypotension: No Has patient had a PCN reaction causing severe rash involving mucus membranes or skin necrosis: No Has patient had a PCN reaction that required hospitalization No Has patient had a PCN reaction occurring within the last 10 years: No If all of the above answers are "NO", then may proceed with Cephalosporin use.  Marland Kitchen Phenergan [Promethazine] Other (See Comments)    Reaction:  Hallucinations  Pt states that she is only allergic to IV form.   . Sulfa Antibiotics Itching  . Tamsulosin Itching  . Lidocaine Palpitations    Outpatient Encounter Medications as of 11/20/2019  Medication Sig  . acetaminophen (TYLENOL) 500 MG tablet Take 500-1,000 mg by mouth every 6 (six) hours as needed for mild pain, moderate pain or headache.   Marland Kitchen aspirin EC 81 MG tablet Take 81 mg by mouth daily.  . brimonidine (ALPHAGAN P) 0.1 % SOLN Place 1 drop into both eyes daily.   Marland Kitchen CALCIUM-VITAMIN D PO Take 600 mg by mouth daily.  . DULoxetine (CYMBALTA) 60 MG capsule Take 1 capsule (60 mg total) by mouth daily.  . Ferrous Sulfate (IRON) 325 (65 Fe) MG TABS Take 1 tablet by mouth daily.  . folic acid (FOLVITE) 706 MCG tablet Take 400 mcg by mouth daily.  . hydrOXYzine (ATARAX/VISTARIL) 25 MG tablet Take 25 mg by mouth every 6 (six) hours as needed for itching.  . tacrolimus (PROGRAF) 0.5 MG capsule Take 4 by mouth at bedtime     No facility-administered encounter medications on file as of 11/20/2019.    Review of Systems  Constitutional: Negative for chills, fatigue and fever.  HENT: Negative for congestion, postnasal drip, rhinorrhea, sinus pressure, sinus pain, sneezing and sore throat.   Eyes: Positive for visual disturbance. Negative for pain, discharge, redness and itching.       Wears eye glasses.Has central vision retina occulusion.states no vision on the left eye.   Respiratory: Negative for cough, chest tightness, shortness of breath and wheezing.   Cardiovascular: Negative for chest pain, palpitations and leg swelling.  Gastrointestinal: Negative for abdominal distention, abdominal pain, diarrhea, nausea and vomiting.  Musculoskeletal: Positive for arthralgias. Negative for gait problem, joint swelling and myalgias.       Right shoulder pain.   Skin: Negative for color change, pallor and rash.  Neurological: Negative for speech difficulty, weakness, light-headedness and headaches.       Numbness and  tingling on right arm.occassional dizziness.     Immunization History  Administered Date(s) Administered  . Fluad Quad(high Dose 65+) 02/15/2019  . Influenza, Seasonal, Injecte, Preservative Fre 03/18/2009  . Influenza,inj,Quad PF,6+ Mos 04/08/2018  . Influenza-Unspecified 03/16/2017  . Moderna SARS-COVID-2 Vaccination 06/14/2019, 07/12/2019  . Pneumococcal Conjugate-13 07/12/2018   Pertinent  Health Maintenance Due  Topic Date Due  . PNA vac Low Risk Adult (2 of 2 - PPSV23) 07/13/2019  . INFLUENZA VACCINE  01/14/2020  . MAMMOGRAM  01/12/2021  . COLONOSCOPY  01/26/2023  . DEXA SCAN  Completed   Fall Risk  11/20/2019 08/16/2019 07/06/2019 02/15/2019 12/23/2018  Falls in the past year? 1 1 1 1 1   Number falls in past yr: 0 1 1 1  0  Injury with Fall? 0 1 1 1 1   Comment - - - Broke nose and chipped 2 front teeth broke her nose  Risk for fall due to : - History of fall(s) - - Other (Comment)  Risk for fall  due to: Comment - - - - fell when raining " storm"  Follow up - - - - Falls prevention discussed    Vitals:   11/20/19 0946  BP: (!) 144/98  Pulse: 83  Resp: 16  Temp: 97.7 F (36.5 C)  SpO2: 98%  Weight: 147 lb 6.4 oz (66.9 kg)  Height: 5\' 2"  (1.575 m)   Body mass index is 26.96 kg/m. Physical Exam Vitals reviewed.  Constitutional:      General: She is not in acute distress.    Appearance: She is overweight. She is not ill-appearing.  HENT:     Head: Normocephalic.     Right Ear: Tympanic membrane, ear canal and external ear normal. There is no impacted cerumen.     Left Ear: Tympanic membrane, ear canal and external ear normal. There is no impacted cerumen.     Nose: Nose normal. No congestion or rhinorrhea.     Mouth/Throat:     Mouth: Mucous membranes are moist.     Pharynx: Oropharynx is clear. No oropharyngeal exudate or posterior oropharyngeal erythema.  Eyes:     General: No scleral icterus.       Right eye: No discharge.        Left eye: No discharge.     Extraocular Movements: Extraocular movements intact.     Conjunctiva/sclera: Conjunctivae normal.     Pupils: Pupils are equal, round, and reactive to light.  Neck:     Vascular: No carotid bruit.  Cardiovascular:     Rate and Rhythm: Normal rate and regular rhythm.     Pulses: Normal pulses.     Heart sounds: Normal heart sounds. No murmur. No friction rub. No gallop.   Pulmonary:     Effort: Pulmonary effort is normal. No respiratory distress.     Breath sounds: Normal breath sounds. No wheezing, rhonchi or rales.  Chest:     Chest wall: No tenderness.  Abdominal:     General: Bowel sounds are normal. There is no distension.     Palpations: Abdomen is soft. There is no mass.     Tenderness: There is no abdominal tenderness. There is no right CVA tenderness, left CVA tenderness, guarding or rebound.  Musculoskeletal:        General: No swelling or tenderness.     Right shoulder: No swelling, deformity,  tenderness or crepitus. Decreased range of motion. Normal strength. Normal pulse.     Left shoulder: Normal.  Cervical back: Normal range of motion. No rigidity or tenderness.     Right lower leg: No edema.     Left lower leg: No edema.  Lymphadenopathy:     Cervical: No cervical adenopathy.  Skin:    General: Skin is warm.     Coloration: Skin is not pale.     Findings: No erythema or rash.  Neurological:     Mental Status: She is alert. Mental status is at baseline.     Cranial Nerves: No cranial nerve deficit.     Sensory: No sensory deficit.     Motor: No weakness.     Gait: Gait normal.  Psychiatric:        Mood and Affect: Mood normal.        Behavior: Behavior normal.        Thought Content: Thought content normal.        Cognition and Memory: Memory is impaired.        Judgment: Judgment normal.    Labs reviewed: Recent Labs    02/15/19 1542 05/16/19 1105 09/06/19 1117  NA 140 141 142  K 4.0 3.4* 3.9  CL 102 103 103  CO2 30 27 30   GLUCOSE 93 96 104*  BUN 13 14 13   CREATININE 1.01* 0.93 1.08*  CALCIUM 9.2 9.2 9.4  MG  --  2.0  --   PHOS  --  4.3  --    Recent Labs    02/15/19 1542 05/16/19 1105 09/06/19 1117  AST 20 21 19   ALT 17 20 16   BILITOT 0.5 0.5 0.6  PROT 6.6 6.8 7.0   Recent Labs    05/16/19 1105 09/06/19 1117  WBC 7.3 7.3  NEUTROABS 4,825 4,840  HGB 14.1 14.9  HCT 40.9 44.0  MCV 89.5 91.1  PLT 191 191   Lab Results  Component Value Date   TSH 2.98 09/06/2019   Lab Results  Component Value Date   HGBA1C 5.3 12/23/2018   Lab Results  Component Value Date   CHOL 208 (H) 09/06/2019   HDL 56 09/06/2019   LDLCALC 134 (H) 09/06/2019   TRIG 83 09/06/2019   CHOLHDL 3.7 09/06/2019    Significant Diagnostic Results in last 30 days:  No results found.  Assessment/Plan 1. Right shoulder pain, unspecified chronicity Limited ROM with raising of hand above head and putting hand behind her back.  - DG Shoulder Right; Future -  diclofenac Sodium (VOLTAREN) 1 % GEL; Apply 2 g topically 4 (four) times daily.  Dispense: 50 g; Refill: 1  2. Dizziness Chronic intermittent.hx of vertigo.Asymptomatic this visit.Neuro exam negative.  Not drinking enough water. Encouraged to increase water intake.   Family/ staff Communication: Reviewed plan of care with patient verbalized understanding.   Labs/tests ordered:  - DG Shoulder Right; Future  Next Appointment: As needed if symptoms worsen or fail to improve.   Sandrea Hughs, NP

## 2019-11-21 ENCOUNTER — Telehealth: Payer: Self-pay

## 2019-11-21 NOTE — Telephone Encounter (Signed)
Candace Hill would like all of her medication transferred from [facility] to [facility] on [address]. She stated she asked that this be done at her visit on 11/20/2019.  Pharmacy was changed today. Reply not needed, this is for informational purposes only.

## 2019-11-23 ENCOUNTER — Telehealth: Payer: Self-pay | Admitting: *Deleted

## 2019-11-23 NOTE — Telephone Encounter (Signed)
Recommend referral to orthopedic for further evaluation of shoulder pain. I don't have any other alterative to Voltaren gel.

## 2019-11-23 NOTE — Telephone Encounter (Signed)
Patient called and stated that she was seen by Dinah on 11/20/19. Stated that she is no better. Stated that Voltaren Gel and Tylenol is not working. She is still have Right Shoulder Pain radiating down the back. Stated that she is taking Tylenol ES 2 Every 4 hours with not relief and using the gel.  Stated that she spoke with some friends in [facility] and they stated that they are thinking it is coming from the Covid Injection. Stated that they are having the same thing happen to them after the 2 nd shot.  Wants to know if there is some alternative. Has tried Cold/heat, Tylenol, and the Voltaren Gel.  Please Advise.     Also changed pharmacies back to [facility] instead of [facility]. (correction made in chart)

## 2019-11-24 NOTE — Telephone Encounter (Signed)
LMOM to return call.

## 2019-11-27 ENCOUNTER — Encounter: Payer: Self-pay | Admitting: Family

## 2019-11-27 ENCOUNTER — Ambulatory Visit (INDEPENDENT_AMBULATORY_CARE_PROVIDER_SITE_OTHER): Payer: Medicare Other | Admitting: Family

## 2019-11-27 ENCOUNTER — Other Ambulatory Visit: Payer: Self-pay

## 2019-11-27 VITALS — BP 148/90 | HR 90 | Temp 95.9°F | Resp 16 | Ht 62.0 in | Wt 146.2 lb

## 2019-11-27 DIAGNOSIS — M25511 Pain in right shoulder: Secondary | ICD-10-CM

## 2019-11-27 NOTE — Patient Instructions (Addendum)
-   Referral place to orthopedic today specialist office will call you for appointment.

## 2019-11-27 NOTE — Telephone Encounter (Signed)
LMOM to return call.

## 2019-11-27 NOTE — Progress Notes (Signed)
Provider: Kyran Connaughton FNP-C  Lauree Chandler, NP  Patient Care Team: Lauree Chandler, NP as PCP - General (Geriatric Medicine) Rolm Bookbinder, MD as Consulting Physician (Dermatology) Gerarda Fraction, MD as Referring Physician (Ophthalmology) Raynelle Bring, MD as Consulting Physician (Urology)  Extended Emergency Contact Information Primary Emergency Contact: Randa Spike [facility] Phone: [phone] Work Phone: [phone] Mobile Phone: [phone] Relation: Daughter  Code Status: Full Code  Goals of care: Advanced Directive information Advanced Directives 11/27/2019  Does Patient Have a Medical Advance Directive? No  Type of Advance Directive -  Does patient want to make changes to medical advance directive? -  Copy of [place] in Chart? -  Would patient like information on creating a medical advance directive? No - Patient declined  Pre-existing out of facility DNR order (yellow form or pink MOST form) -     Chief Complaint  Patient presents with  . Acute Visit    Severe shoulder pain    HPI:  Pt is a 74 y.o. female seen today for an acute visit for evaluation of severe shoulder pain. She was here recently for similar symptoms.Right shoulder X-ray order showed no fracture or dislocation.Also had no arthropathy or bone abnormality soft tissues were unremarkable.she continues to complain of right shoulder pain states her friends from [place] told her that after getting CVOD-19 vaccine it spreads to the shoulder.she denies any numbness,tingling or weakness.  Past Medical History:  Diagnosis Date  . Abnormal uterine bleeding   . Acid reflux   . Asthma    [phone] due to black mold house, no asthma now  . Broken ankle    right  . Broken rib    x2  . Cancer ([place])    liver (in the one that was removed)  . Central retinal vein occlusion few yrs   left eye retinal occlusion  . Complication of anesthesia    IV phenergan   serious mental reaction  . Depression   . Hepatitis C    HEP C  . Hernia, femoral    left  . History of blood transfusion 1971   acquired hepatitis c  . History of kidney stones   . History of renal stone   . History of shingles   . Hypertension    due to profraf  . Kidney stones    x 4 per new patient packet   . Renal stone 11/2012  . Seasonal affective disorder ([place])   . 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 Tailbone injury age 18 and age 76 and few weeks ago   broken x3  . TIA (transient ischemic attack)   . 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 CATARACT EXTRACTION     bilateral  . CHOLECYSTECTOMY  1997  . CYSTOSCOPY W/ URETERAL STENT PLACEMENT Left 08/14/2016   Procedure: CYSTOSCOPY WITH RETROGRADE PYELOGRAM/URETERAL LEFT URETEROSCOPY AND LEFT STENT PLACEMENT;  Surgeon: Raynelle Bring, MD;  Location: [facility];  Service: Urology;  Laterality: Left;  . CYSTOSCOPY/URETEROSCOPY/HOLMIUM LASER/STENT PLACEMENT Left 07/06/2016   Procedure: CYSTOSCOPY/URETEROSCOPY/ BILATERAL RETROGRADE/HOLMIUM LASER/STENT PLACEMENT/BASKET STONE REMOVAL;  Surgeon: Raynelle Bring, MD;  Location: [facility];  Service: Urology;  Laterality: Left;  . CYSTOSCOPY/URETEROSCOPY/HOLMIUM LASER/STENT PLACEMENT Left 08/06/2016   Procedure: CYSTOSCOPY/URETEROSCOPY/HOLMIUM LASER/STENT PLACEMENT;  Surgeon: Raynelle Bring, MD;  Location: [facility];  Service: Urology;  Laterality: Left;  .  CYSTOSCOPY/URETEROSCOPY/HOLMIUM LASER/STENT PLACEMENT Left 08/31/2016   Procedure: CYSTOSCOPY/URETEROSCOPY/HOLMIUM LASER/STENT PLACEMENT/BASKET STONE REMOVAL;  Surgeon: Raynelle Bring, MD;  Location: [facility];  Service: Urology;  Laterality: Left;  . FEMORAL HERNIA REPAIR  1997   left leg  . LIVER TRANSPLANTATION    . NASAL SINUS SURGERY  1997   x3  . TONSILLECTOMY  age 24  . 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 TUBAL LIGATION       Allergies  Allergen Reactions  . Codeine Nausea Only  . 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    Marland Kitchen Penicillins Nausea Only and Other (See Comments)    Has patient had a PCN reaction causing immediate rash, facial/tongue/throat swelling, SOB or lightheadedness with hypotension: No Has patient had a PCN reaction causing severe rash involving mucus membranes or skin necrosis: No Has patient had a PCN reaction that required hospitalization No Has patient had a PCN reaction occurring within the last 10 years: No If all of the above answers are "NO", then may proceed with Cephalosporin use.  Marland Kitchen Phenergan [Promethazine] Other (See Comments)    Reaction:  Hallucinations  Pt states that she is only allergic to IV form.   . Sulfa Antibiotics Itching  . Tamsulosin Itching  . Lidocaine Palpitations    Outpatient Encounter Medications as of 11/27/2019  Medication Sig  . acetaminophen (TYLENOL) 500 MG tablet Take 500-1,000 mg by mouth every 6 (six) hours as needed for mild pain, moderate pain or headache.   Marland Kitchen aspirin EC 81 MG tablet Take 81 mg by mouth daily.  . brimonidine (ALPHAGAN P) 0.1 % SOLN Place 1 drop into both eyes daily.   Marland Kitchen CALCIUM-VITAMIN D PO Take 600 mg by mouth daily.  . diclofenac Sodium (VOLTAREN) 1 % GEL Apply 2 g topically 4 (four) times daily.  . DULoxetine (CYMBALTA) 60 MG capsule Take 1 capsule (60 mg total) by mouth daily.  . Ferrous Sulfate (IRON) 325 (65 Fe) MG TABS Take 1 tablet by mouth daily.  . folic acid (FOLVITE) 330 MCG tablet Take 400 mcg by mouth daily.  . hydrOXYzine (ATARAX/VISTARIL) 25 MG tablet Take 25 mg by mouth every 6 (six) hours as needed for itching.  . tacrolimus (PROGRAF) 0.5 MG capsule Take 3 by mouth at bedtime   No facility-administered encounter medications on file as of 11/27/2019.    Review of Systems  Constitutional: Negative for chills, fatigue and fever.  Respiratory: Negative for cough,  chest tightness, shortness of breath and wheezing.   Cardiovascular: Negative for chest pain, palpitations and leg swelling.  Musculoskeletal: Positive for arthralgias. Negative for back pain, gait problem and myalgias.       Right shoulder pain   Skin: Negative for color change, pallor and rash.  Neurological: Negative for dizziness, speech difficulty, weakness, light-headedness, numbness and headaches.    Immunization History  Administered Date(s) Administered  . Fluad Quad(high Dose 65+) 02/15/2019  . Influenza, Seasonal, Injecte, Preservative Fre 03/18/2009  . Influenza,inj,Quad PF,6+ Mos 04/08/2018  . Influenza-Unspecified 03/16/2017  . Moderna SARS-COVID-2 Vaccination 06/14/2019, 07/12/2019  . Pneumococcal Conjugate-13 07/12/2018   Pertinent  Health Maintenance Due  Topic Date Due  . PNA vac Low Risk Adult (2 of 2 - PPSV23) 07/13/2019  . INFLUENZA VACCINE  01/14/2020  . MAMMOGRAM  01/12/2021  . COLONOSCOPY  01/26/2023  . DEXA SCAN  Completed   Fall Risk  11/27/2019 11/20/2019 08/16/2019 07/06/2019 02/15/2019  Falls in the  past year? 1 1 1 1 1   Number falls in past yr: 0 0 1 1 1   Injury with Fall? 0 0 1 1 1   Comment - - - - Broke nose and chipped 2 front teeth  Risk for fall due to : - - History of fall(s) - -  Risk for fall due to: Comment - - - - -  Follow up - - - - -    Vitals:   11/27/19 1314  BP: (!) 148/90  Pulse: 90  Resp: 16  Temp: (!) 95.9 F (35.5 C)  SpO2: 97%  Weight: 146 lb 3.2 oz (66.3 kg)  Height: 5\' 2"  (1.575 m)   Body mass index is 26.74 kg/m. Physical Exam Vitals reviewed.  Constitutional:      General: She is not in acute distress.    Appearance: She is overweight. She is not ill-appearing.  Cardiovascular:     Rate and Rhythm: Normal rate and regular rhythm.     Pulses: Normal pulses.     Heart sounds: Normal heart sounds. No murmur heard.  No friction rub. No gallop.   Pulmonary:     Effort: Pulmonary effort is normal. No respiratory  distress.     Breath sounds: Normal breath sounds. No wheezing, rhonchi or rales.  Chest:     Chest wall: No tenderness.  Musculoskeletal:        General: Tenderness present. No swelling.     Right lower leg: No edema.     Left lower leg: No edema.     Comments: Pain with ROM with tenderness over trapeze muscle.  Skin:    General: Skin is warm and dry.     Coloration: Skin is not pale.     Findings: No bruising, erythema or rash.  Neurological:     Mental Status: She is alert.     Cranial Nerves: No cranial nerve deficit.     Sensory: No sensory deficit.     Motor: No weakness.     Coordination: Coordination normal.     Gait: Gait normal.    Labs reviewed: Recent Labs    02/15/19 1542 05/16/19 1105 09/06/19 1117  NA 140 141 142  K 4.0 3.4* 3.9  CL 102 103 103  CO2 30 27 30   GLUCOSE 93 96 104*  BUN 13 14 13   CREATININE 1.01* 0.93 1.08*  CALCIUM 9.2 9.2 9.4  MG  --  2.0  --   PHOS  --  4.3  --    Recent Labs    02/15/19 1542 05/16/19 1105 09/06/19 1117  AST 20 21 19   ALT 17 20 16   BILITOT 0.5 0.5 0.6  PROT 6.6 6.8 7.0   Recent Labs    05/16/19 1105 09/06/19 1117  WBC 7.3 7.3  NEUTROABS 4,825 4,840  HGB 14.1 14.9  HCT 40.9 44.0  MCV 89.5 91.1  PLT 191 191   Lab Results  Component Value Date   TSH 2.98 09/06/2019   Lab Results  Component Value Date   HGBA1C 5.3 12/23/2018   Lab Results  Component Value Date   CHOL 208 (H) 09/06/2019   HDL 56 09/06/2019   LDLCALC 134 (H) 09/06/2019   TRIG 83 09/06/2019   CHOLHDL 3.7 09/06/2019    Significant Diagnostic Results in last 30 days:  DG Shoulder Right  Result Date: 11/20/2019 CLINICAL DATA:  Right shoulder pain EXAM: RIGHT SHOULDER - 2+ VIEW COMPARISON:  None. FINDINGS: There is no evidence of fracture  or dislocation. There is no evidence of arthropathy or other focal bone abnormality. Soft tissues are unremarkable. IMPRESSION: Negative. Electronically Signed   By: Davina Poke D.O.   On:  11/20/2019 16:41    Assessment/Plan   Right shoulder pain, unspecified chronicity Limites ROM with overhead reach and tenderness over trapeze muscle. X-ray result negative for fracture,dislocation or other abnormalities. Voltaren gel and Tylenol ineffective.Discussed referral to Orthopedic for further evaluation.she request referral to Orthopedic specialist on [address] in [place] does not recall practice name or doctor's name.Verified practice name [facility].   - Ambulatory referral to Orthopedic Surgery     Family/ staff Communication: Reviewed plan of care with patient  Labs/tests ordered: None   Next Appointment:   Sandrea Hughs, NP

## 2019-11-28 ENCOUNTER — Telehealth: Payer: Self-pay

## 2019-11-28 NOTE — Telephone Encounter (Signed)
Lisa,please see orthopedic referral information didn't have the practice name or provider during visit but remembered the street name.

## 2019-11-28 NOTE — Telephone Encounter (Signed)
Incoming call received from patient to provide Dinah with some additional information per conversation that took place in yesterdays visit.  [facility]   Location [address]  Dr.Peter G.Rhona Raider  [phone] (physician appointment) [phone]

## 2019-11-29 DIAGNOSIS — M67911 Unspecified disorder of synovium and tendon, right shoulder: Secondary | ICD-10-CM | POA: Diagnosis not present

## 2019-11-29 NOTE — Telephone Encounter (Signed)
Yes, I faxed over referral & clinical info for scheduling on 11/28/19. [facility] was previously [facility], which is how they were previously listed.  Thanks, Vilinda Blanks

## 2019-12-01 NOTE — Telephone Encounter (Signed)
Patient notified and stated that she is having therapy on her shoulder.

## 2019-12-13 IMAGING — CT CT MAXILLOFACIAL W/O CM
4 of 6 series · 16 of 47 positions shown, 18 images · non-contrast
Comparison: Brain MRI 01/15/2015. Head CT 12/27/2012.

CLINICAL DATA: 72-year-old female status post fall down stairs.
Face and nose swelling and pain.

EXAM:
CT HEAD WITHOUT CONTRAST
CT MAXILLOFACIAL WITHOUT CONTRAST
TECHNIQUE: Multidetector CT imaging of the head and maxillofacial structures
were performed using the standard protocol without intravenous
contrast. Multiplanar CT image reconstructions of the maxillofacial
structures were also generated.

[Series 3: head wo · axial · 0.47mm/px · z∈[+1369,+1474]mm · 6 of 31 slices shown, 8 images]
[im 5/31  brain]
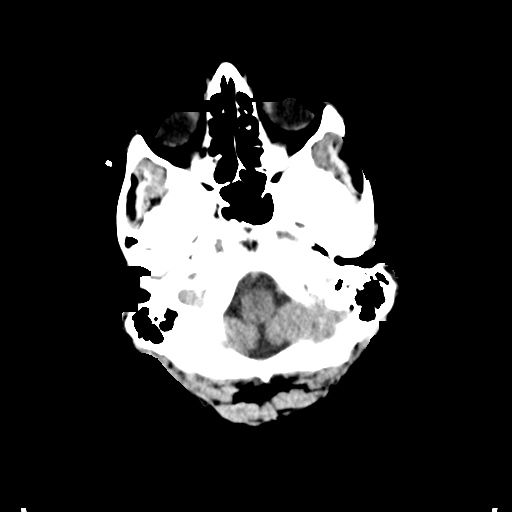
[im 5/31  bone]
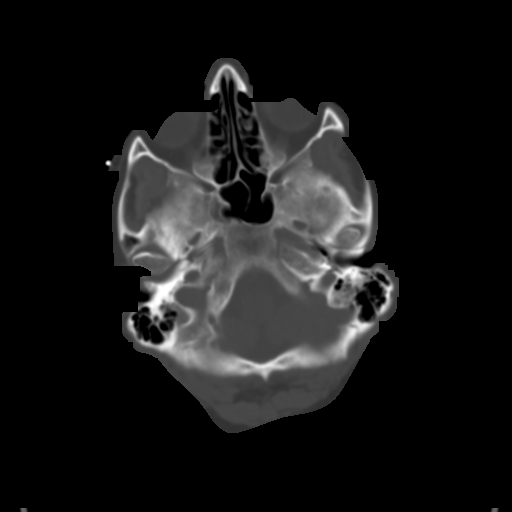
[im 9/31  bone]
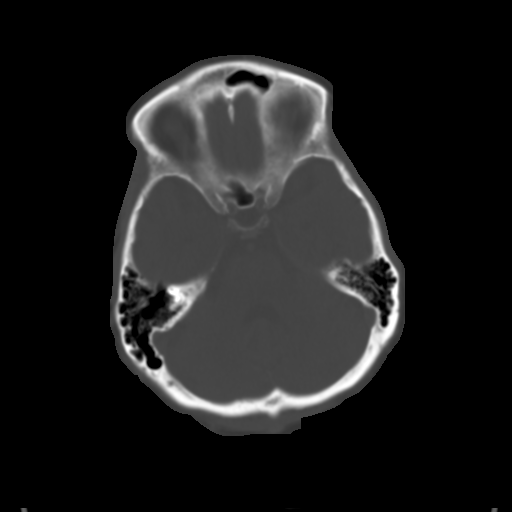
[im 13/31  bone]
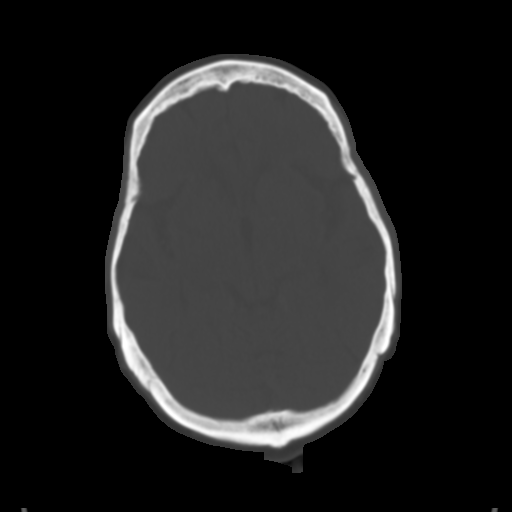
[im 18/31  bone]
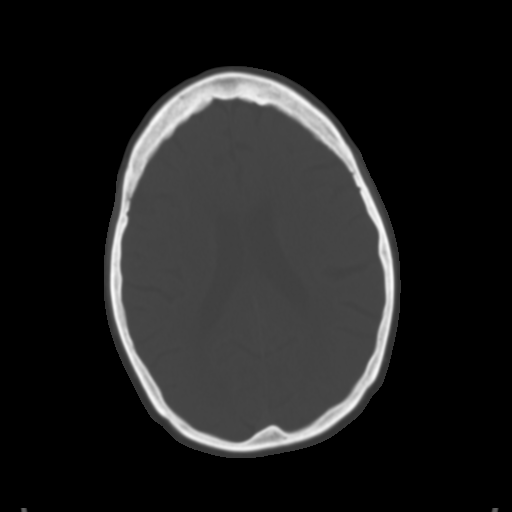
[im 22/31  brain]
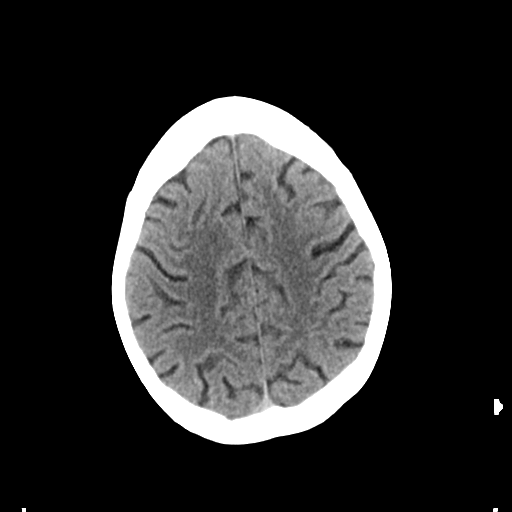
[im 22/31  bone]
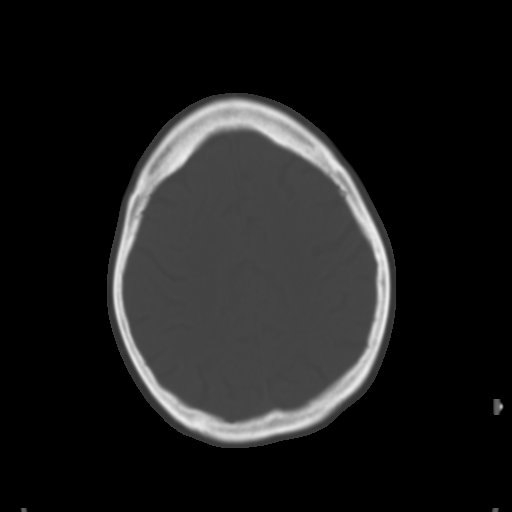
[im 26/31  bone]
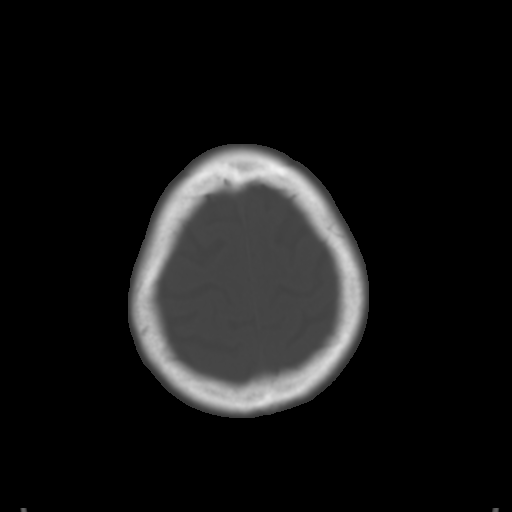

[Series 8: max soft · axial · 0.34mm/px · z∈[+1258,+1334]mm · 5 of 81 slices shown]
[im 8/81  brain]
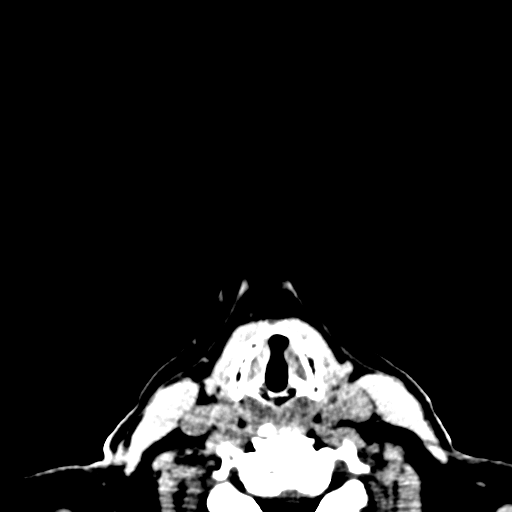
[im 16/81  brain]
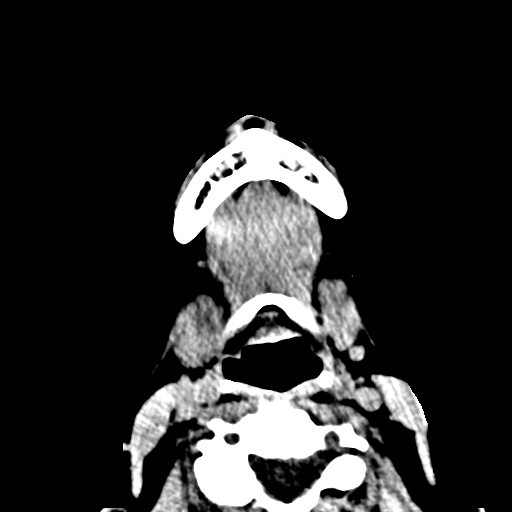
[im 27/81  brain]
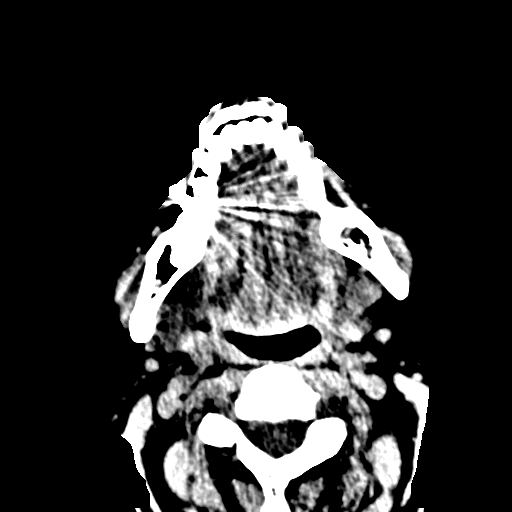
[im 35/81  brain]
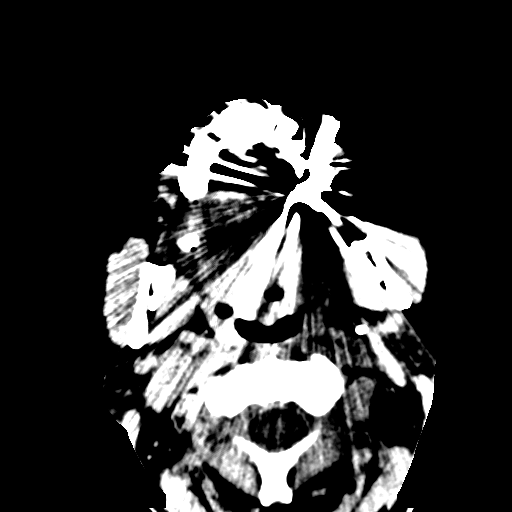
[im 46/81  brain]
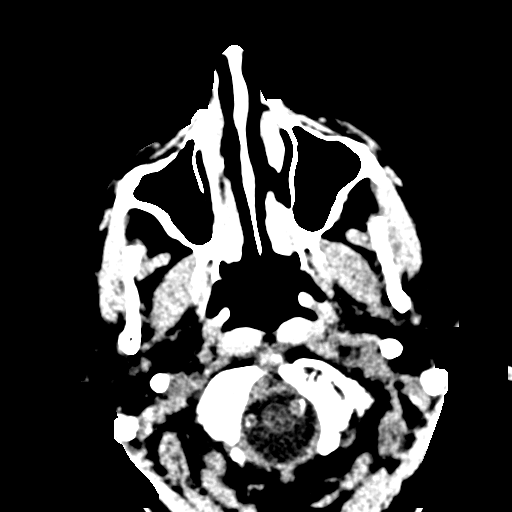

[Series 12: coronal soft · coronal · 0.30mm/px · 3 of 79 slices shown]
[im 16/79  bone]
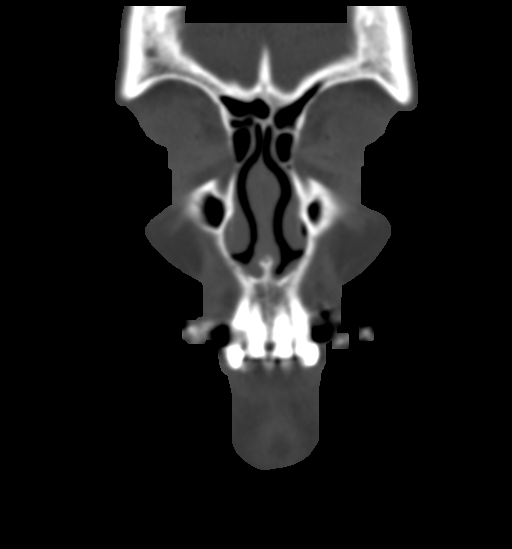
[im 32/79  bone]
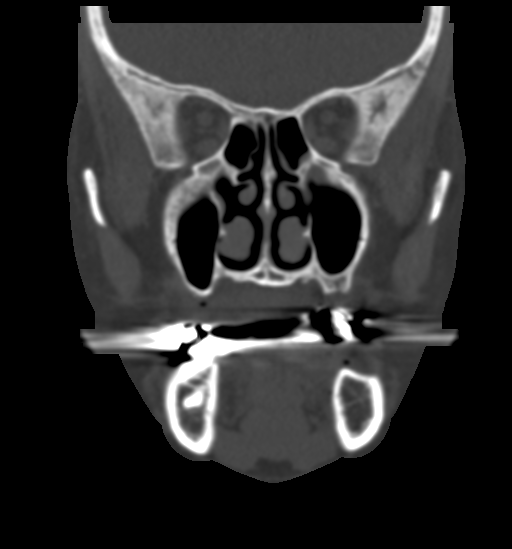
[im 47/79  bone]
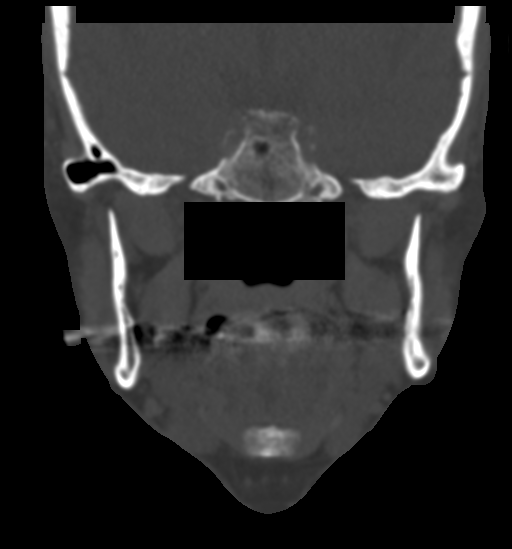

[Series 13: sagittal soft · sagittal · 0.32mm/px · 2 of 74 slices shown]
[im 25/74  bone]
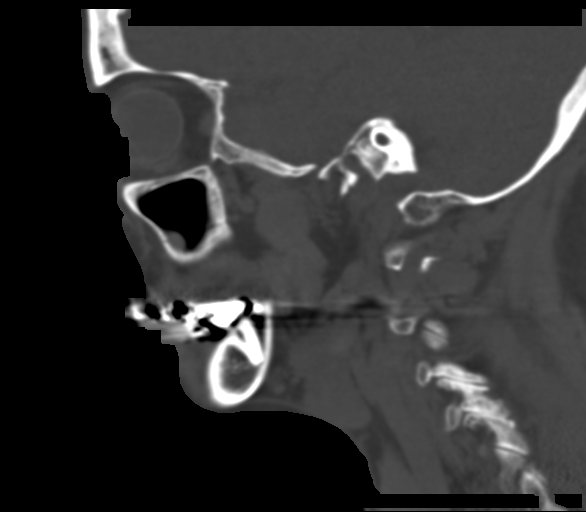
[im 49/74  bone]
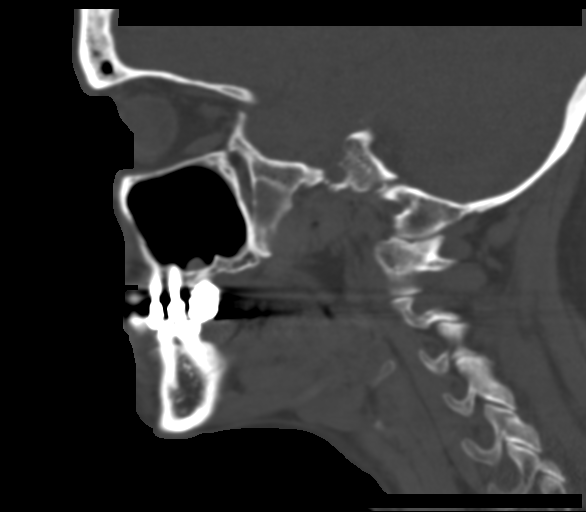

[16 of 47 positions shown; findings below may reference images not displayed]

FINDINGS: CT HEAD FINDINGS

Brain: Cerebral volume is stable and normal for age. No midline
shift, ventriculomegaly, mass effect, evidence of mass lesion,
intracranial hemorrhage or evidence of cortically based acute
infarction.

Mild for age white matter hypodensity, most pronounced in the
periatrial white matter, appears similar to the 1364 MRI. No
cortical encephalomalacia.

Vascular: Calcified atherosclerosis at the skull base. No suspicious
intracranial vascular hyperdensity.

Skull: Stable and intact.

Other: No scalp hematoma identified.

CT MAXILLOFACIAL FINDINGS

Osseous: Mandible intact. No maxilla or zygoma fracture.

Possible minimally displaced acute left nasal bone fracture on
series 9, image 29. Mild if any overlying nasal soft tissue
swelling. No soft tissue gas.

Central skull base and visible cervical spine appear intact.

Orbits: Intact bilateral orbital walls. Postoperative changes to the
globes but otherwise negative orbits soft tissues.

Sinuses: Chronic maxillary sinus mucoperiosteal thickening. But
paranasal sinuses and mastoids are stable and well pneumatized.
Tympanic cavities are clear.

Soft tissues: Negative visible noncontrast thyroid, larynx, pharynx,
parapharyngeal spaces, retropharyngeal space, sublingual space,
submandibular spaces, masticator spaces, parotid spaces. No
superficial soft tissue gas or hematoma identified. Upper cervical
lymph nodes are diminutive and normal.
IMPRESSION: 1. Possible minimally displaced left nasal bone fracture.
2. No other acute traumatic injury identified in the head or face.
3. Stable and largely normal for age noncontrast CT appearance of
the brain.

## 2019-12-14 DIAGNOSIS — F411 Generalized anxiety disorder: Secondary | ICD-10-CM | POA: Diagnosis not present

## 2019-12-14 DIAGNOSIS — F331 Major depressive disorder, recurrent, moderate: Secondary | ICD-10-CM | POA: Diagnosis not present

## 2019-12-20 ENCOUNTER — Other Ambulatory Visit: Payer: Self-pay

## 2019-12-20 ENCOUNTER — Encounter: Payer: Self-pay | Admitting: Neurology

## 2019-12-20 ENCOUNTER — Ambulatory Visit (INDEPENDENT_AMBULATORY_CARE_PROVIDER_SITE_OTHER): Payer: Medicare Other | Admitting: Neurology

## 2019-12-20 VITALS — BP 142/76 | HR 91 | Ht 63.0 in | Wt 149.8 lb

## 2019-12-20 DIAGNOSIS — R479 Unspecified speech disturbances: Secondary | ICD-10-CM | POA: Diagnosis not present

## 2019-12-20 DIAGNOSIS — R413 Other amnesia: Secondary | ICD-10-CM | POA: Diagnosis not present

## 2019-12-20 DIAGNOSIS — G459 Transient cerebral ischemic attack, unspecified: Secondary | ICD-10-CM | POA: Diagnosis not present

## 2019-12-20 NOTE — Progress Notes (Signed)
NEUROLOGY CONSULTATION NOTE  Candace Hill MRN: [id number] DOB: Sep 27, 1945  Referring provider: Sherrie Mustache, NP Primary care provider: Sherrie Mustache, NP  Reason for consult:  Memory loss   Thank you for your kind referral of Candace Hill for consultation of the above symptoms. Although her history is well known to you, please allow me to reiterate it for the purpose of our medical record.  She is alone in the office today. Records and images were personally reviewed where available.   HISTORY OF PRESENT ILLNESS: This is a pleasant 74 year old right-handed woman with a history of hypertension, hepatitis C, liver cancer s/p liver transplant, left retinal vein occlusion, TIA, presenting for evaluation of memory loss. She is verbose and slightly tangential. She saw her PCP in January 2021 reporting that her nurse friend is concerned about her memory. She was told she should be getting more things done and is having trouble with this. She was in the process of moving and was having a hard time, she moved last December. She says "I've always pushed myself too much." She has noticed memory changes herself, she cannot remember things in a row. 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 She lives alone. She denies getting lost driving. She denies missing medications. She has to juggle things so she could keep up with things in general. MMSE 28/30 in July 2020. She now lives in senior independent living at [facility]. She states she was adopted, and that her mother had Alzheimer's disease. She denies any significant head injuries. No alcohol use.   She is supposed to follow-up in [facility] for her liver transplant, but keeps saying that [facility] screwed up. She states "they went me the fake antirejection medication" and Medicare called her about it, she was sent the generic that is cheaper. She reports left retinal vein  occlusion as a result of her steroids for her transplant. She has 7% vision on the left. She recalls and ruminates on a fall in February 2020 when she was in the bank and broke her nose and 2 front teeth, saying "someone caused me to have an accident." She states she has not been okay since then. She falls more, feeling dizzy with spinning sensation. She cannot climb steps because she gets very dizzy. She reports an incident last week on the phone, she could not understand anything she was trying to say, gibberish was coming out. This lasted a few hours, the next morning she started to talk. She recalls having a headache at that time. She denied any focal weakness. She started B12 supplements in March 2021, B12 level was 248.  Laboratory Data: Lab Results  Component Value Date   TSH 2.98 09/06/2019   Lab Results  Component Value Date   VITAMINB12 248 09/06/2019     PAST MEDICAL HISTORY: Past Medical History:  Diagnosis Date  . Abnormal uterine bleeding   . Acid reflux   . Asthma    [phone] due to black mold house, no asthma now  . Broken ankle    right  . Broken rib    x2  . Cancer ([place])    liver (in the one that was removed)  . Central retinal vein occlusion few yrs   left eye retinal occlusion  . Complication of anesthesia    IV phenergan  serious mental reaction  . Depression   . Hepatitis C    HEP  C  . Hernia, femoral    left  . History of blood transfusion 1971   acquired hepatitis c  . History of kidney stones   . History of renal stone   . History of shingles   . Hypertension    due to profraf  . Kidney stones    x 4 per new patient packet   . Renal stone 11/2012  . Seasonal affective disorder ([place])   . Stroke [facility])    TIA x2 caused by prednisone  . Tailbone injury age 30 and age 20 and few weeks ago   broken x3  . TIA (transient ischemic attack)   . 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 CATARACT EXTRACTION     bilateral  . CHOLECYSTECTOMY  1997  . CYSTOSCOPY W/ URETERAL STENT PLACEMENT Left 08/14/2016   Procedure: CYSTOSCOPY WITH RETROGRADE PYELOGRAM/URETERAL LEFT URETEROSCOPY AND LEFT STENT PLACEMENT;  Surgeon: Raynelle Bring, MD;  Location: [facility];  Service: Urology;  Laterality: Left;  . CYSTOSCOPY/URETEROSCOPY/HOLMIUM LASER/STENT PLACEMENT Left 07/06/2016   Procedure: CYSTOSCOPY/URETEROSCOPY/ BILATERAL RETROGRADE/HOLMIUM LASER/STENT PLACEMENT/BASKET STONE REMOVAL;  Surgeon: Raynelle Bring, MD;  Location: [facility];  Service: Urology;  Laterality: Left;  . CYSTOSCOPY/URETEROSCOPY/HOLMIUM LASER/STENT PLACEMENT Left 08/06/2016   Procedure: CYSTOSCOPY/URETEROSCOPY/HOLMIUM LASER/STENT PLACEMENT;  Surgeon: Raynelle Bring, MD;  Location: [facility];  Service: Urology;  Laterality: Left;  . CYSTOSCOPY/URETEROSCOPY/HOLMIUM LASER/STENT PLACEMENT Left 08/31/2016   Procedure: CYSTOSCOPY/URETEROSCOPY/HOLMIUM LASER/STENT PLACEMENT/BASKET STONE REMOVAL;  Surgeon: Raynelle Bring, MD;  Location: [facility];  Service: Urology;  Laterality: Left;  . FEMORAL HERNIA REPAIR  1997   left leg  . LIVER TRANSPLANTATION    . NASAL SINUS SURGERY  1997   x3  . TONSILLECTOMY  age 21  . 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 TUBAL LIGATION      MEDICATIONS: Current Outpatient Medications on File Prior to Visit  Medication Sig Dispense Refill  . acetaminophen (TYLENOL) 500 MG tablet Take 500-1,000 mg by mouth every 6 (six) hours as needed for mild pain, moderate pain or headache.     Marland Kitchen aspirin EC 81 MG tablet Take 81 mg by mouth daily.    . brimonidine (ALPHAGAN P) 0.1 % SOLN Place 1 drop into both eyes daily.     Marland Kitchen CALCIUM-VITAMIN D PO Take 600 mg by mouth daily.    . diclofenac Sodium (VOLTAREN) 1 % GEL Apply 2 g topically 4 (four) times daily. 50 g 1  . DULoxetine (CYMBALTA) 60 MG capsule Take 1 capsule (60 mg total) by mouth daily. 90 capsule 1  .  Ferrous Sulfate (IRON) 325 (65 Fe) MG TABS Take 1 tablet by mouth daily.    . folic acid (FOLVITE) 335 MCG tablet Take 400 mcg by mouth daily.    . hydrOXYzine (ATARAX/VISTARIL) 25 MG tablet Take 25 mg by mouth every 6 (six) hours as needed for itching.    . tacrolimus (PROGRAF) 0.5 MG capsule Take 3 by mouth at bedtime 180 capsule 1   No current facility-administered medications on file prior to visit.    ALLERGIES: Allergies  Allergen Reactions  . Codeine Nausea Only  . 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    Marland Kitchen Penicillins Nausea Only and Other (See Comments)    Has patient had a PCN reaction causing immediate rash, facial/tongue/throat  swelling, SOB or lightheadedness with hypotension: No Has patient had a PCN reaction causing severe rash involving mucus membranes or skin necrosis: No Has patient had a PCN reaction that required hospitalization No Has patient had a PCN reaction occurring within the last 10 years: No If all of the above answers are "NO", then may proceed with Cephalosporin use.  Marland Kitchen Phenergan [Promethazine] Other (See Comments)    Reaction:  Hallucinations  Pt states that she is only allergic to IV form.   . Sulfa Antibiotics Itching  . Tamsulosin Itching  . Lidocaine Palpitations    FAMILY HISTORY: Family History  Adopted: Yes  Problem Relation Age of Onset  . Stroke Mother   . Alzheimer's disease Mother   . Cancer Mother   . Diabetes Mother        Questionable, per new patient packet   . Arthritis Mother        Questionable, per new patient packet   . Alcoholism Father   . COPD Brother        Heavy smoker, per new patient packet   . Suicidality Sister   . Depression Sister   . COPD Sister   . OCD Daughter   . Personality disorder Daughter   . Alcoholism Son     SOCIAL HISTORY: Social History   Socioeconomic History  . Marital status: Divorced    Spouse name: Not on file  . Number of children: 2  .  Years of education: 75  . Highest education level: Not on file  Occupational History  . Occupation: Disabled  Tobacco Use  . Smoking status: Never Smoker  . Smokeless tobacco: Never Used  Vaping Use  . Vaping Use: Never used  Substance and Sexual Activity  . Alcohol use: No  . Drug use: No  . Sexual activity: Never    Comment: 14 years ago  Other Topics Concern  . Not on file  Social History Narrative   Lives at alone alone.   Right-handed.   No caffeine use.      PSC- as of 10/12/17   Diet: No Fried Foods       Caffeine: Summer time, sweet tea       Married, if yes what year: Divorced       Do you live in a house, apartment, assisted living, condo, trailer, ect: [place], 1 stories, and 1 person       Pets: 1 dog       Current/Past profession: Retail buyer       Exercise: Some, walking dog       Living Will: yes   DNR: yes   POA/HPOA: yes      Functional Status: Patient did not answer   Do you have difficulty bathing or dressing yourself?   Do you have difficulty preparing food or eating?   Do you have difficulty managing your medications?   Do you have difficulty managing your finances?   Do you have difficulty affording your medications?   Social Determinants of Health   Financial Resource Strain:   . Difficulty of Paying Living Expenses:   Food Insecurity:   . Worried About Charity fundraiser in the Last Year:   . Arboriculturist in the Last Year:   Transportation Needs:   . Film/video editor (Medical):   Marland Kitchen Lack of Transportation (Non-Medical):   Physical Activity:   . Days of Exercise per Week:   . Minutes of Exercise per  Session:   Stress:   . Feeling of Stress :   Social Connections:   . Frequency of Communication with Friends and Family:   . Frequency of Social Gatherings with Friends and Family:   . Attends Religious Services:   . Active Member of Clubs or Organizations:   . Attends Archivist Meetings:   Marland Kitchen Marital  Status:   Intimate Partner Violence:   . Fear of Current or Ex-Partner:   . Emotionally Abused:   Marland Kitchen Physically Abused:   . Sexually Abused:     PHYSICAL EXAM: Vitals:   12/20/19 1409  BP: (!) 142/76  Pulse: 91  SpO2: 97%   General: No acute distress Head:  Normocephalic/atraumatic Skin/Extremities: No rash, no edema Neurological Exam: Mental status: alert and oriented to person, place, and time, no dysarthria or aphasia, Fund of knowledge is appropriate.  Recent and remote memory are intact.  Attention and concentration are normal.   SLUMS score 25/30.  [place] Mental Exam 12/20/2019  Weekday Correct 1  Current year 1  What state are we in? 1  Amount spent 1  Amount left 0  # of Animals 2  5 objects recall 3  Number series 2  Hour markers 2  Time correct 2  Placed X in triangle correctly 1  Largest Figure 1  Name of female 2  Date back to work 2  Type of work 2  State she lived in 2  Total score 25    Cranial nerves: CN I: not tested CN II: pupils equal, round and reactive to light, visual fields intact CN III, IV, VI:  full range of motion, no nystagmus, no ptosis CN V: facial sensation intact CN VII: upper and lower face symmetric CN VIII: hearing intact to conversation Bulk & Tone: normal, no fasciculations. Motor: 5/5 throughout with no pronator drift. Sensation: intact to light touch, cold, pin, vibration sense.  No extinction to double simultaneous stimulation.  Romberg test negative Deep Tendon Reflexes: +1 throughout Cerebellar: no incoordination on finger to nose testing Gait: narrow-based and steady, no ataxia Tremor: none  IMPRESSION: This is a pleasant 74 year old right-handed woman with a history of hypertension, hepatitis C, liver cancer s/p liver transplant, left retinal vein occlusion, TIA, presenting for evaluation of memory loss. Her neurological exam today is normal. She is verbose and slightly tangential in the office today. SLUMS  score 25/30. We discussed different causes of memory loss, she denies any clear difficulties with complex tasks. Neurocognitive testing will be ordered to further evaluate cognitive concerns. She reports an episode of speech changes last week. MRI brain without contrast and MRA head without contrast will be ordered for possible TIA. TIA workup with echocardiogram and carotid dopplers ordered. Continue daily aspirin and control of vascular risk factors. She knows to go to the ER for any sudden change in symptoms. Follow-up after tests, she knows to call for any changes.  Thank you for allowing me to participate in the care of this patient. Please do not hesitate to call for any questions or concerns.   Ellouise Newer, M.D.  CC: Sherrie Mustache, NP

## 2019-12-20 NOTE — Patient Instructions (Addendum)
1. Schedule MRI brain without contrast, MRA head without contrast  2. Schedule carotid ultrasounds  3. Schedule echocardiogram  4. Schedule Neurocognitive testing  5. For any sudden change in symptoms, go to ER immediately

## 2019-12-25 ENCOUNTER — Encounter: Payer: Medicare Other | Admitting: Nurse Practitioner

## 2019-12-27 DIAGNOSIS — M25511 Pain in right shoulder: Secondary | ICD-10-CM | POA: Diagnosis not present

## 2019-12-27 DIAGNOSIS — Z944 Liver transplant status: Principal | ICD-10-CM

## 2019-12-27 DIAGNOSIS — Z79899 Other long term (current) drug therapy: Principal | ICD-10-CM

## 2019-12-27 MED ORDER — PROGRAF 0.5 MG CAPSULE
ORAL_CAPSULE | Freq: Two times a day (BID) | ORAL | 1 refills | 30 days | Status: CP
Start: 2019-12-27 — End: ?

## 2020-01-02 ENCOUNTER — Ambulatory Visit (INDEPENDENT_AMBULATORY_CARE_PROVIDER_SITE_OTHER): Payer: Medicare Other | Admitting: Nurse Practitioner

## 2020-01-02 ENCOUNTER — Telehealth: Payer: Self-pay

## 2020-01-02 ENCOUNTER — Other Ambulatory Visit: Payer: Self-pay

## 2020-01-02 ENCOUNTER — Encounter: Payer: Self-pay | Admitting: Nurse Practitioner

## 2020-01-02 DIAGNOSIS — Z0000 Encounter for general adult medical examination without abnormal findings: Secondary | ICD-10-CM | POA: Diagnosis not present

## 2020-01-02 NOTE — Telephone Encounter (Signed)
Ms. Candace Hill, Candace Hill are scheduled for a virtual visit with your provider today.    Just as we do with appointments in the office, we must obtain your consent to participate.  Your consent will be active for this visit and any virtual visit you may have with one of our providers in the next 365 days.    If you have a MyChart account, I can also send a copy of this consent to you electronically.  All virtual visits are billed to your insurance company just like a traditional visit in the office.  As this is a virtual visit, video technology does not allow for your provider to perform a traditional examination.  This may limit your provider's ability to fully assess your condition.  If your provider identifies any concerns that need to be evaluated in person or the need to arrange testing such as labs, EKG, etc, we will make arrangements to do so.    Although advances in technology are sophisticated, we cannot ensure that it will always work on either your end or our end.  If the connection with a video visit is poor, we may have to switch to a telephone visit.  With either a video or telephone visit, we are not always able to ensure that we have a secure connection.   I need to obtain your verbal consent now.   Are you willing to proceed with your visit today?   Candace Hill has provided verbal consent on 01/02/2020 for a virtual visit (video or telephone).   Carroll Kinds, CMA 01/02/2020  11:50 AM  2

## 2020-01-02 NOTE — Progress Notes (Signed)
This service is provided via telemedicine  No vital signs collected/recorded due to the encounter was a telemedicine visit.   Location of patient (ex: home, work):  Home  Patient consents to a telephone visit:  Yes, see telephone encounter dated 01/02/2020  Location of the provider (ex: office, home):  [place]  Name of any referring provider:  N/A  Names of all persons participating in the telemedicine service and their role in the encounter:  Sherrie Mustache, Nurse Practitioner, Carroll Kinds, CMA, and patient.   Time spent on call:  30 minutes with medical assistant

## 2020-01-02 NOTE — Progress Notes (Signed)
Subjective:   Candace Hill is a 74 y.o. female who presents for Medicare Annual (Subsequent) preventive examination.  Review of Systems     Cardiac Risk Factors include: advanced age (>52men, >62 women);hypertension;family history of premature cardiovascular disease     Objective:    There were no vitals filed for this visit. There is no height or weight on file to calculate BMI.  Advanced Directives 01/02/2020 12/20/2019 11/27/2019 11/20/2019 08/16/2019 12/23/2018 12/23/2018  Does Patient Have a Medical Advance Directive? Yes Yes No No Yes No No  Type of Paramedic of [place];Living will - - [place];Living will - -  Does patient want to make changes to medical advance directive? No - Patient declined - - - No - Patient declined No - Patient declined No - Patient declined  Copy of [place] in Chart? No - copy requested - - - No - copy requested - -  Would patient like information on creating a medical advance directive? - - No - Patient declined No - Patient declined - No - Patient declined No - Patient declined  Pre-existing out of facility DNR order (yellow form or pink MOST form) - - - - - - -    Current Medications (verified) Outpatient Encounter Medications as of 01/02/2020  Medication Sig  . aspirin EC 81 MG tablet Take 81 mg by mouth daily.  . brimonidine (ALPHAGAN P) 0.1 % SOLN Place 1 drop into both eyes daily.   Marland Kitchen CALCIUM-VITAMIN D PO Take 600 mg by mouth daily.  . DULoxetine (CYMBALTA) 60 MG capsule Take 1 capsule (60 mg total) by mouth daily.  . Ferrous Sulfate (IRON) 325 (65 Fe) MG TABS Take 1 tablet by mouth daily.  . folic acid (FOLVITE) 993 MCG tablet Take 400 mcg by mouth daily.  . hydrOXYzine (ATARAX/VISTARIL) 25 MG tablet Take 25 mg by mouth every 6 (six) hours as needed for itching.  . tacrolimus (PROGRAF) 0.5 MG capsule Take 3 by mouth at bedtime  . [DISCONTINUED]  acetaminophen (TYLENOL) 500 MG tablet Take 500-1,000 mg by mouth every 6 (six) hours as needed for mild pain, moderate pain or headache.   . [DISCONTINUED] diclofenac Sodium (VOLTAREN) 1 % GEL Apply 2 g topically 4 (four) times daily.   No facility-administered encounter medications on file as of 01/02/2020.    Allergies (verified) Codeine, Other, Penicillins, Phenergan [promethazine], Sulfa antibiotics, Tamsulosin, and Lidocaine   History: Past Medical History:  Diagnosis Date  . Abnormal uterine bleeding   . Acid reflux   . Asthma    [phone] due to black mold house, no asthma now  . Broken ankle    right  . Broken rib    x2  . Cancer ([place])    liver (in the one that was removed)  . Central retinal vein occlusion few yrs   left eye retinal occlusion  . Complication of anesthesia    IV phenergan  serious mental reaction  . Depression   . Hepatitis C    HEP C  . Hernia, femoral    left  . History of blood transfusion 1971   acquired hepatitis c  . History of kidney stones   . History of renal stone   . History of shingles   . Hypertension    due to profraf  . Kidney stones    x 4 per new patient packet   . Renal stone 11/2012  . Seasonal  affective disorder ([place])   . 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 Tailbone injury age 12 and age 58 and few weeks ago   broken x3  . TIA (transient ischemic attack)   . 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 CATARACT EXTRACTION     bilateral  . CHOLECYSTECTOMY  1997  . CYSTOSCOPY W/ URETERAL STENT PLACEMENT Left 08/14/2016   Procedure: CYSTOSCOPY WITH RETROGRADE PYELOGRAM/URETERAL LEFT URETEROSCOPY AND LEFT STENT PLACEMENT;  Surgeon: Raynelle Bring, MD;  Location: [facility];  Service: Urology;  Laterality: Left;  . CYSTOSCOPY/URETEROSCOPY/HOLMIUM LASER/STENT PLACEMENT Left 07/06/2016   Procedure:  CYSTOSCOPY/URETEROSCOPY/ BILATERAL RETROGRADE/HOLMIUM LASER/STENT PLACEMENT/BASKET STONE REMOVAL;  Surgeon: Raynelle Bring, MD;  Location: [facility];  Service: Urology;  Laterality: Left;  . CYSTOSCOPY/URETEROSCOPY/HOLMIUM LASER/STENT PLACEMENT Left 08/06/2016   Procedure: CYSTOSCOPY/URETEROSCOPY/HOLMIUM LASER/STENT PLACEMENT;  Surgeon: Raynelle Bring, MD;  Location: [facility];  Service: Urology;  Laterality: Left;  . CYSTOSCOPY/URETEROSCOPY/HOLMIUM LASER/STENT PLACEMENT Left 08/31/2016   Procedure: CYSTOSCOPY/URETEROSCOPY/HOLMIUM LASER/STENT PLACEMENT/BASKET STONE REMOVAL;  Surgeon: Raynelle Bring, MD;  Location: [facility];  Service: Urology;  Laterality: Left;  . FEMORAL HERNIA REPAIR  1997   left leg  . LIVER TRANSPLANTATION    . NASAL SINUS SURGERY  1997   x3  . TONSILLECTOMY  age 43  . 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 TUBAL LIGATION     Family History  Adopted: Yes  Problem Relation Age of Onset  . Stroke Mother   . Alzheimer's disease Mother   . Cancer Mother   . Diabetes Mother        Questionable, per new patient packet   . Arthritis Mother        Questionable, per new patient packet   . Alcoholism Father   . COPD Brother        Heavy smoker, per new patient packet   . Suicidality Sister   . Depression Sister   . COPD Sister   . OCD Daughter   . Personality disorder Daughter   . Alcoholism Son    Social History   Socioeconomic History  . Marital status: Divorced    Spouse name: Not on file  . Number of children: 2  . Years of education: 85  . Highest education level: Not on file  Occupational History  . Occupation: Disabled  Tobacco Use  . Smoking status: Never Smoker  . Smokeless tobacco: Never Used  Vaping Use  . Vaping Use: Never used  Substance and Sexual Activity  . Alcohol use: No  . Drug use: No  . Sexual activity: Never    Comment: 14 years ago  Other Topics Concern  . Not on file  Social History Narrative   Lives at alone alone.   Right-handed.   No caffeine  use.      PSC- as of 10/12/17   Diet: No Fried Foods       Caffeine: Summer time, sweet tea       Married, if yes what year: Divorced       Do you live in a house, apartment, assisted living, condo, trailer, ect: [place], 1 stories, and 1 person       Pets: 1 dog       Current/Past profession: Retail buyer       Exercise: Some, walking dog       Living  Will: yes   DNR: yes   POA/HPOA: yes      Functional Status: Patient did not answer   Do you have difficulty bathing or dressing yourself?   Do you have difficulty preparing food or eating?   Do you have difficulty managing your medications?   Do you have difficulty managing your finances?   Do you have difficulty affording your medications?   Social Determinants of Health   Financial Resource Strain:   . Difficulty of Paying Living Expenses:   Food Insecurity:   . Worried About Charity fundraiser in the Last Year:   . Arboriculturist in the Last Year:   Transportation Needs:   . Film/video editor (Medical):   Marland Kitchen Lack of Transportation (Non-Medical):   Physical Activity:   . Days of Exercise per Week:   . Minutes of Exercise per Session:   Stress:   . Feeling of Stress :   Social Connections:   . Frequency of Communication with Friends and Family:   . Frequency of Social Gatherings with Friends and Family:   . Attends Religious Services:   . Active Member of Clubs or Organizations:   . Attends Archivist Meetings:   Marland Kitchen Marital Status:     Tobacco Counseling Counseling given: Not Answered   Clinical Intake:  Pre-visit preparation completed: Yes  Pain : No/denies pain     BMI - recorded: 26 Nutritional Status: BMI 25 -29 Overweight Nutritional Risks: Non-healing wound Diabetes: No  How often do you need to have someone help you when you read instructions, pamphlets, or other written materials from your doctor or pharmacy?: 2 - Rarely  Diabetic? no  Interpreter Needed?: No        Activities of Daily Living In your present state of health, do you have any difficulty performing the following activities: 01/02/2020  Hearing? Y  Vision? Y  Difficulty concentrating or making decisions? Y  Walking or climbing stairs? N  Dressing or bathing? N  Doing errands, shopping? N  Preparing Food and eating ? N  Using the Toilet? N  In the past six months, have you accidently leaked urine? Y  Do you have problems with loss of bowel control? N  Managing your Medications? N  Managing your Finances? N  Housekeeping or managing your Housekeeping? N  Some recent data might be hidden    Patient Care Team: Lauree Chandler, NP as PCP - General (Geriatric Medicine) Rolm Bookbinder, MD as Consulting Physician (Dermatology) Gerarda Fraction, MD as Referring Physician (Ophthalmology) Raynelle Bring, MD as Consulting Physician (Urology)  Indicate any recent Medical Services you may have received from other than [facility] providers in the past year (date may be approximate).     Assessment:   This is a routine wellness examination for Candace Hill.  Hearing/Vision screen  Hearing Screening   125Hz  250Hz  500Hz  1000Hz  2000Hz  3000Hz  4000Hz  6000Hz  8000Hz   Right ear:           Left ear:           Comments: Patient states that she has hearing issues. It seems to be getting weaker.  Vision Screening Comments: Patient has problems with vision left eye. Problems from transplant.  Dietary issues and exercise activities discussed: Current Exercise Habits: The patient does not participate in regular exercise at present  Goals    . Patient Stated     Would like to start singing again      Depression Screen  PHQ 2/9 Scores 01/02/2020 12/23/2018 12/23/2018 11/14/2018 12/20/2017  PHQ - 2 Score 0 0 0 - 2  PHQ- 9 Score - - - - 8  Exception Documentation - - - Other- indicate reason in comment box -  Not completed - - - Was under the care of a counslor.  -    Fall Risk Fall Risk  01/02/2020 12/20/2019  11/27/2019 11/20/2019 08/16/2019  Falls in the past year? 1 0 1 1 1   Number falls in past yr: 0 0 0 0 1  Injury with Fall? 0 0 0 0 1  Comment - - - - -  Risk for fall due to : - - - - History of fall(s)  Risk for fall due to: Comment - - - - -  Follow up - - - - -    Any stairs in or around the home? Yes  If so, are there any without handrails? No  Home free of loose throw rugs in walkways, pet beds, electrical cords, etc? Yes  Adequate lighting in your home to reduce risk of falls? Yes   ASSISTIVE DEVICES UTILIZED TO PREVENT FALLS:  Life alert? No  Use of a cane, walker or w/c? No  Grab bars in the bathroom? Yes  Shower chair or bench in shower? No  Elevated toilet seat or a handicapped toilet? Yes   TIMED UP AND GO: na  Cognitive Function: MMSE - Mini Mental State Exam 12/23/2018 12/20/2017  Orientation to time 4 4  Orientation to Place 4 4  Registration 3 3  Attention/ Calculation 5 5  Recall 3 3  Language- name 2 objects 2 2  Language- repeat 1 1  Language- follow 3 step command 3 3  Language- read & follow direction 1 1  Write a sentence 1 1  Copy design 1 1  Total score 28 28     6CIT Screen 01/02/2020  What Year? 0 points  What month? 0 points  What time? 0 points  Count back from 20 0 points  Months in reverse 0 points  Repeat phrase 2 points  Total Score 2    Immunizations Immunization History  Administered Date(s) Administered  . Fluad Quad(high Dose 65+) 02/15/2019  . Influenza, Seasonal, Injecte, Preservative Fre 03/18/2009  . Influenza,inj,Quad PF,6+ Mos 04/08/2018  . Influenza-Unspecified 03/16/2017  . Moderna SARS-COVID-2 Vaccination 06/14/2019, 07/12/2019  . Pneumococcal Conjugate-13 07/12/2018    TDAP status: Due, Education has been provided regarding the importance of this vaccine. Advised may receive this vaccine at local pharmacy or Health Dept. Aware to provide a copy of the vaccination record if obtained from local pharmacy or Health Dept.  Verbalized acceptance and understanding. Flu Vaccine status: Up to date Needs to update PNA vaccine  Covid-19 vaccine status: Completed vaccines  Qualifies for Shingles Vaccine? Yes   Zostavax completed No   Shingrix Completed?: No.    Education has been provided regarding the importance of this vaccine. Patient has been advised to call insurance company to determine out of pocket expense if they have not yet received this vaccine. Advised may also receive vaccine at local pharmacy or Health Dept. Verbalized acceptance and understanding.  Screening Tests Health Maintenance  Topic Date Due  . TETANUS/TDAP  Never done  . PNA vac Low Risk Adult (2 of 2 - PPSV23) 07/13/2019  . INFLUENZA VACCINE  01/14/2020  . MAMMOGRAM  01/12/2021  . COLONOSCOPY  01/26/2023  . DEXA SCAN  Completed  . COVID-19 Vaccine  Completed  .  Hepatitis C Screening  Completed    Health Maintenance  Health Maintenance Due  Topic Date Due  . TETANUS/TDAP  Never done  . PNA vac Low Risk Adult (2 of 2 - PPSV23) 07/13/2019    Colorectal cancer screening: Completed 2014. Repeat every 10 years Mammogram status: Completed 7 31 2020. Repeat every year Bone Density status: Ordered 3 31 2021. Pt provided with contact info and advised to call to schedule appt.  Lung Cancer Screening: (Low Dose CT Chest recommended if Age 54-80 years, 30 pack-year currently smoking OR have quit w/in 15years.) does not qualify.   Lung Cancer Screening Referral: na  Additional Screening:  Hepatitis C Screening:  Completed  Vision Screening: Recommended annual ophthalmology exams for early detection of glaucoma and other disorders of the eye. Is the patient up to date with their annual eye exam?  Yes  Who is the provider or what is the name of the office in which the patient attends annual eye exams?  If pt is not established with a provider, would they like to be referred to a provider to establish care? No .   Dental Screening:  Recommended annual dental exams for proper oral hygiene  Community Resource Referral / Chronic Care Management: CRR required this visit?  No   CCM required this visit?  No      Plan:     I have personally reviewed and noted the following in the patient's chart:   . Medical and social history . Use of alcohol, tobacco or illicit drugs  . Current medications and supplements . Functional ability and status . Nutritional status . Physical activity . Advanced directives . List of other physicians . Hospitalizations, surgeries, and ER visits in previous 12 months . Vitals . Screenings to include cognitive, depression, and falls . Referrals and appointments  In addition, I have reviewed and discussed with patient certain preventive protocols, quality metrics, and best practice recommendations. A written personalized care plan for preventive services as well as general preventive health recommendations were provided to patient.     Lauree Chandler, NP   01/02/2020

## 2020-01-02 NOTE — Patient Instructions (Addendum)
Candace Hill , Thank you for taking time to come for your Medicare Wellness Visit. I appreciate your ongoing commitment to your health goals. Please review the following plan we discussed and let me know if I can assist you in the future.   Screening recommendations/referrals: Colonoscopy up to date Mammogram up to date.  Bone Density has been ordered- to make appt at [facility] Recommended yearly ophthalmology/optometry visit for glaucoma screening and checkup Recommended yearly dental visit for hygiene and checkup  Vaccinations: Influenza vaccine up to date Pneumococcal vaccine-will need pneumococcal 23, can get this in office or at pharmacy  Tdap vaccine: DUE- recommended to get at your local pharmacy Shingles vaccine DUE- recommended to get at your local pharmacy.    Advanced directives: to bring updated copies to the office   Conditions/risks identified: non-healing wounds (to make an appt regarding this), memory loss, cardiovascular disease, advanced age.  Next appointment: 1 year   Preventive Care 46 Years and Older, Female Preventive care refers to lifestyle choices and visits with your health care provider that can promote health and wellness. What does preventive care include?  A yearly physical exam. This is also called an annual well check.  Dental exams once or twice a year.  Routine eye exams. Ask your health care provider how often you should have your eyes checked.  Personal lifestyle choices, including:  Daily care of your teeth and gums.  Regular physical activity.  Eating a healthy diet.  Avoiding tobacco and drug use.  Limiting alcohol use.  Practicing safe sex.  Taking low-dose aspirin every day.  Taking vitamin and mineral supplements as recommended by your health care provider. What happens during an annual well check? The services and screenings done by your health care provider during your annual well check will depend on  your age, overall health, lifestyle risk factors, and family history of disease. Counseling  Your health care provider may ask you questions about your:  Alcohol use.  Tobacco use.  Drug use.  Emotional well-being.  Home and relationship well-being.  Sexual activity.  Eating habits.  History of falls.  Memory and ability to understand (cognition).  Work and work Statistician.  Reproductive health. Screening  You may have the following tests or measurements:  Height, weight, and BMI.  Blood pressure.  Lipid and cholesterol levels. These may be checked every 5 years, or more frequently if you are over 41 years old.  Skin check.  Lung cancer screening. You may have this screening every year starting at age 23 if you have a 30-pack-year history of smoking and currently smoke or have quit within the past 15 years.  Fecal occult blood test (FOBT) of the stool. You may have this test every year starting at age 30.  Flexible sigmoidoscopy or colonoscopy. You may have a sigmoidoscopy every 5 years or a colonoscopy every 10 years starting at age 27.  Hepatitis C blood test.  Hepatitis B blood test.  Sexually transmitted disease (STD) testing.  Diabetes screening. This is done by checking your blood sugar (glucose) after you have not eaten for a while (fasting). You may have this done every 1-3 years.  Bone density scan. This is done to screen for osteoporosis. You may have this done starting at age 75.  Mammogram. This may be done every 1-2 years. Talk to your health care provider about how often you should have regular mammograms. Talk with your health care provider about your test results, treatment options,  and if necessary, the need for more tests. Vaccines  Your health care provider may recommend certain vaccines, such as:  Influenza vaccine. This is recommended every year.  Tetanus, diphtheria, and acellular pertussis (Tdap, Td) vaccine. You may need a Td booster  every 10 years.  Zoster vaccine. You may need this after age 40.  Pneumococcal 13-valent conjugate (PCV13) vaccine. One dose is recommended after age 64.  Pneumococcal polysaccharide (PPSV23) vaccine. One dose is recommended after age 24. Talk to your health care provider about which screenings and vaccines you need and how often you need them. This information is not intended to replace advice given to you by your health care provider. Make sure you discuss any questions you have with your health care provider. Document Released: 06/28/2015 Document Revised: 02/19/2016 Document Reviewed: 04/02/2015 Elsevier Interactive Patient Education  2017 [place] Prevention in the Home Falls can cause injuries. They can happen to people of all ages. There are many things you can do to make your home safe and to help prevent falls. What can I do on the outside of my home?  Regularly fix the edges of walkways and driveways and fix any cracks.  Remove anything that might make you trip as you walk through a door, such as a raised step or threshold.  Trim any bushes or trees on the path to your home.  Use bright outdoor lighting.  Clear any walking paths of anything that might make someone trip, such as rocks or tools.  Regularly check to see if handrails are loose or broken. Make sure that both sides of any steps have handrails.  Any raised decks and porches should have guardrails on the edges.  Have any leaves, snow, or ice cleared regularly.  Use sand or salt on walking paths during winter.  Clean up any spills in your garage right away. This includes oil or grease spills. What can I do in the bathroom?  Use night lights.  Install grab bars by the toilet and in the tub and shower. Do not use towel bars as grab bars.  Use non-skid mats or decals in the tub or shower.  If you need to sit down in the shower, use a plastic, non-slip stool.  Keep the floor dry. Clean up any  water that spills on the floor as soon as it happens.  Remove soap buildup in the tub or shower regularly.  Attach bath mats securely with double-sided non-slip rug tape.  Do not have throw rugs and other things on the floor that can make you trip. What can I do in the bedroom?  Use night lights.  Make sure that you have a light by your bed that is easy to reach.  Do not use any sheets or blankets that are too big for your bed. They should not hang down onto the floor.  Have a firm chair that has side arms. You can use this for support while you get dressed.  Do not have throw rugs and other things on the floor that can make you trip. What can I do in the kitchen?  Clean up any spills right away.  Avoid walking on wet floors.  Keep items that you use a lot in easy-to-reach places.  If you need to reach something above you, use a strong step stool that has a grab bar.  Keep electrical cords out of the way.  Do not use floor polish or wax that makes floors slippery. If  you must use wax, use non-skid floor wax.  Do not have throw rugs and other things on the floor that can make you trip. What can I do with my stairs?  Do not leave any items on the stairs.  Make sure that there are handrails on both sides of the stairs and use them. Fix handrails that are broken or loose. Make sure that handrails are as long as the stairways.  Check any carpeting to make sure that it is firmly attached to the stairs. Fix any carpet that is loose or worn.  Avoid having throw rugs at the top or bottom of the stairs. If you do have throw rugs, attach them to the floor with carpet tape.  Make sure that you have a light switch at the top of the stairs and the bottom of the stairs. If you do not have them, ask someone to add them for you. What else can I do to help prevent falls?  Wear shoes that:  Do not have high heels.  Have rubber bottoms.  Are comfortable and fit you well.  Are closed  at the toe. Do not wear sandals.  If you use a stepladder:  Make sure that it is fully opened. Do not climb a closed stepladder.  Make sure that both sides of the stepladder are locked into place.  Ask someone to hold it for you, if possible.  Clearly mark and make sure that you can see:  Any grab bars or handrails.  First and last steps.  Where the edge of each step is.  Use tools that help you move around (mobility aids) if they are needed. These include:  Canes.  Walkers.  Scooters.  Crutches.  Turn on the lights when you go into a dark area. Replace any light bulbs as soon as they burn out.  Set up your furniture so you have a clear path. Avoid moving your furniture around.  If any of your floors are uneven, fix them.  If there are any pets around you, be aware of where they are.  Review your medicines with your doctor. Some medicines can make you feel dizzy. This can increase your chance of falling. Ask your doctor what other things that you can do to help prevent falls. This information is not intended to replace advice given to you by your health care provider. Make sure you discuss any questions you have with your health care provider. Document Released: 03/28/2009 Document Revised: 11/07/2015 Document Reviewed: 07/06/2014 Elsevier Interactive Patient Education  2017 [employer].

## 2020-01-03 ENCOUNTER — Telehealth: Payer: Self-pay

## 2020-01-03 ENCOUNTER — Encounter: Payer: Medicare Other | Admitting: Psychology

## 2020-01-03 DIAGNOSIS — R413 Other amnesia: Secondary | ICD-10-CM

## 2020-01-03 NOTE — Telephone Encounter (Signed)
I am sorry to hear this but in order for the providers to see the patients on their schedules they request for those who are late to be rescheduled. I would recommend calling back to see if there is a cancellation list or checking in occasionally to see if another appt opens up.

## 2020-01-03 NOTE — Telephone Encounter (Signed)
Patient called just wanting to let us that she was upset because she went to [facility] this morning and was rescheduled for a September appointment because she was 13 minutes late. She stated that she did not know about their 10 minute late policy. She left crying and upset because she felt that she should have been able to be seen or at least given an earlier appointment other than September because she had a mini stroke. She doesn't feel like she can wait until then to be seen. Wanted to know if Janett Billow would give her a call to speak with her since she is familiar with her. Patient's number is [phone]. Please advise.

## 2020-01-04 ENCOUNTER — Telehealth: Payer: Self-pay

## 2020-01-04 NOTE — Telephone Encounter (Signed)
Ms. Browder called back and spoke with Lovena Le. Patient stated that called [facility] and spoke with the Supervisor and stated that the supervisor was rude to her. She also stated that the supervisor and the front desk staff were rude to her. She would like to have a referral to another neurologist.

## 2020-01-04 NOTE — Telephone Encounter (Signed)
Patient called and was upset because she was had to reschedule her appointment with Dr. Melvyn Novas after arriving 40 minutes late for her appointment on 01/03/20 @ 8:30am. She stated that Sarah at the front dek stated that the office policy is that if a patient is 10 minutes late for their appointment they will need to reschedule. Dr. Melvyn Novas was willing to see the patient even if she showed up 20 minutes late however after arriving 40 minutes late it was determined that she would need to reschedule. Patient was upset because she does not agree with our policy and states that no one ever informed her that she would have to reschedule if she was past 10 minutes late for her appointment. She stated that it is severe punishment to make her reschedule because now she has to wait until September before she can be seen (appt. R/s to 02/08/20 @ 1:00pm). I apologized for the inconvenience and tried to explain to patient due the length of her appointment time and testing involved that we had no other choice but to reschedule her. She stated that she did not like my answer and she asked for a number of someone higher than me so that she could file a complaint. I informed her that she would need to contact the patient/compliance office. She then said I'm done talking now "bye" and hung up.

## 2020-01-04 NOTE — Telephone Encounter (Signed)
Called patient, but no answer. Left message for patient to call the office so that I could give her Jessica's response.

## 2020-01-05 DIAGNOSIS — F331 Major depressive disorder, recurrent, moderate: Secondary | ICD-10-CM | POA: Diagnosis not present

## 2020-01-05 DIAGNOSIS — F411 Generalized anxiety disorder: Secondary | ICD-10-CM | POA: Diagnosis not present

## 2020-01-08 ENCOUNTER — Other Ambulatory Visit: Payer: Self-pay

## 2020-01-08 ENCOUNTER — Ambulatory Visit (HOSPITAL_COMMUNITY): Payer: Medicare Other | Attending: Internal Medicine

## 2020-01-08 DIAGNOSIS — R479 Unspecified speech disturbances: Secondary | ICD-10-CM | POA: Insufficient documentation

## 2020-01-08 DIAGNOSIS — G459 Transient cerebral ischemic attack, unspecified: Secondary | ICD-10-CM | POA: Diagnosis not present

## 2020-01-08 DIAGNOSIS — R413 Other amnesia: Secondary | ICD-10-CM | POA: Insufficient documentation

## 2020-01-08 LAB — ECHOCARDIOGRAM COMPLETE
Area-P 1/2: 7.02 cm2
S' Lateral: 2 cm

## 2020-01-09 ENCOUNTER — Telehealth: Payer: Self-pay

## 2020-01-09 NOTE — Telephone Encounter (Signed)
Pt called and informed that the echocardiogram was normal

## 2020-01-09 NOTE — Telephone Encounter (Signed)
-----   Message from Cameron Sprang, MD sent at 01/09/2020  9:20 AM EDT ----- Pls let her know that the echocardiogram was normal, thanks

## 2020-01-10 ENCOUNTER — Encounter: Payer: Medicare Other | Admitting: Psychology

## 2020-01-21 ENCOUNTER — Other Ambulatory Visit: Payer: Self-pay

## 2020-01-21 ENCOUNTER — Ambulatory Visit
Admission: RE | Admit: 2020-01-21 | Discharge: 2020-01-21 | Disposition: A | Discharge status: Home / Self Care | Payer: Medicare Other | Source: Ambulatory Visit | Attending: Neurology | Admitting: Neurology

## 2020-01-21 DIAGNOSIS — R479 Unspecified speech disturbances: Secondary | ICD-10-CM

## 2020-01-21 DIAGNOSIS — G459 Transient cerebral ischemic attack, unspecified: Secondary | ICD-10-CM

## 2020-01-21 DIAGNOSIS — R413 Other amnesia: Secondary | ICD-10-CM

## 2020-01-21 DIAGNOSIS — I672 Cerebral atherosclerosis: Secondary | ICD-10-CM | POA: Diagnosis not present

## 2020-01-23 ENCOUNTER — Telehealth: Payer: Self-pay

## 2020-01-23 NOTE — Telephone Encounter (Signed)
-----   Message from Cameron Sprang, MD sent at 01/23/2020 12:56 PM EDT ----- Pls let her know the MRI brain did not show any evidence of tumor, stroke, or bleed. It showed age-related changes and hardening of the small blood vessels seen in patients with blood pressure and cholesterol issues. Very important to continue daily aspirin and control of BP, cholesterol, sugar with her PCP. Thanks

## 2020-01-23 NOTE — Telephone Encounter (Signed)
Patient returned call and was informed of her results. Patient expressed understanding of results. She stated that she doesn't have a PCP but will call her insurance company to have them help her find one in her network.

## 2020-01-29 DIAGNOSIS — H348122 Central retinal vein occlusion, left eye, stable: Secondary | ICD-10-CM | POA: Diagnosis not present

## 2020-01-29 DIAGNOSIS — Z961 Presence of intraocular lens: Secondary | ICD-10-CM | POA: Diagnosis not present

## 2020-01-29 DIAGNOSIS — H40013 Open angle with borderline findings, low risk, bilateral: Secondary | ICD-10-CM | POA: Diagnosis not present

## 2020-01-29 DIAGNOSIS — I10 Essential (primary) hypertension: Secondary | ICD-10-CM | POA: Diagnosis not present

## 2020-01-31 ENCOUNTER — Encounter: Payer: Self-pay | Admitting: Nurse Practitioner

## 2020-01-31 ENCOUNTER — Ambulatory Visit (INDEPENDENT_AMBULATORY_CARE_PROVIDER_SITE_OTHER): Payer: Medicare Other | Admitting: Nurse Practitioner

## 2020-01-31 ENCOUNTER — Other Ambulatory Visit: Payer: Self-pay

## 2020-01-31 VITALS — BP 140/80 | HR 88 | Temp 96.6°F | Ht 63.0 in | Wt 152.2 lb

## 2020-01-31 DIAGNOSIS — M25511 Pain in right shoulder: Secondary | ICD-10-CM

## 2020-01-31 DIAGNOSIS — R413 Other amnesia: Secondary | ICD-10-CM

## 2020-01-31 DIAGNOSIS — I10 Essential (primary) hypertension: Secondary | ICD-10-CM | POA: Diagnosis not present

## 2020-01-31 DIAGNOSIS — E782 Mixed hyperlipidemia: Secondary | ICD-10-CM | POA: Diagnosis not present

## 2020-01-31 NOTE — Progress Notes (Signed)
Careteam: Patient Care Team: Lauree Chandler, NP as PCP - General (Geriatric Medicine) Rolm Bookbinder, MD as Consulting Physician (Dermatology) Gerarda Fraction, MD as Referring Physician (Ophthalmology) Raynelle Bring, MD as Consulting Physician (Urology)  PLACE OF SERVICE:  [place]  Advanced Directive information    Allergies  Allergen Reactions  . Codeine Nausea Only  . 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    Marland Kitchen Penicillins Nausea Only and Other (See Comments)    Has patient had a PCN reaction causing immediate rash, facial/tongue/throat swelling, SOB or lightheadedness with hypotension: No Has patient had a PCN reaction causing severe rash involving mucus membranes or skin necrosis: No Has patient had a PCN reaction that required hospitalization No Has patient had a PCN reaction occurring within the last 10 years: No If all of the above answers are "NO", then may proceed with Cephalosporin use.  Marland Kitchen Phenergan [Promethazine] Other (See Comments)    Reaction:  Hallucinations  Pt states that she is only allergic to IV form.   . Sulfa Antibiotics Itching  . Tamsulosin Itching  . Lidocaine Palpitations    Chief Complaint  Patient presents with  . Results    Discuss CT results  . Immunizations    Discuss need for PNA and TD   . Pruritis    Discuss medication      HPI: Patient is a 74 y.o. female for follow up from neurologist.   Reports she does not feel like the doctor did not advise the radiologist on what to look for on her MRI.  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  Reports that she does not want to go back to the neurologist that she saw.   Reports shoulder pain has improved no longer having issues.  Went to orthopedic and found excellent doctor.   Reports [employer] stopped carrying her 2 years ago and she just  got notified for this. Would like to speak with someone on this to make sure claims are being filed approprietly. Front desk confirmed this with her.   Review of Systems:  Review of Systems  Constitutional: Negative for chills, fever and weight loss.  HENT: Negative for tinnitus.   Respiratory: Negative for cough, sputum production and shortness of breath.   Cardiovascular: Negative for chest pain, palpitations and leg swelling.  Gastrointestinal: Negative for abdominal pain, constipation, diarrhea and heartburn.  Genitourinary: Negative for dysuria, frequency and urgency.  Musculoskeletal: Negative for back pain, falls, joint pain and myalgias.  Skin: Negative.   Neurological: Positive for dizziness. Negative for headaches.  Psychiatric/Behavioral: Positive for memory loss. Negative for depression. The patient does not have insomnia.     Past Medical History:  Diagnosis Date  . Abnormal uterine bleeding   . Acid reflux   . Asthma    [phone] due to black mold house, no asthma now  . Broken ankle    right  . Broken rib    x2  . Cancer ([place])    liver (in the one that was removed)  . Central retinal vein occlusion few yrs   left eye retinal occlusion  . Complication of anesthesia    IV phenergan  serious mental reaction  . Depression   . Hepatitis C    HEP C  . Hernia, femoral    left  . History  of blood transfusion 1971   acquired hepatitis c  . History of kidney stones   . History of renal stone   . History of shingles   . Hypertension    due to profraf  . Kidney stones    x 4 per new patient packet   . Renal stone 11/2012  . 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 Tailbone injury age 28 and age 55 and few weeks ago   broken x3  . TIA (transient ischemic attack)   . 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 CATARACT EXTRACTION     bilateral  . CHOLECYSTECTOMY  1997  . CYSTOSCOPY W/ URETERAL STENT PLACEMENT Left 08/14/2016   Procedure: CYSTOSCOPY WITH RETROGRADE PYELOGRAM/URETERAL LEFT URETEROSCOPY AND LEFT STENT PLACEMENT;  Surgeon: Raynelle Bring, MD;  Location: [facility];  Service: Urology;  Laterality: Left;  . CYSTOSCOPY/URETEROSCOPY/HOLMIUM LASER/STENT PLACEMENT Left 07/06/2016   Procedure: CYSTOSCOPY/URETEROSCOPY/ BILATERAL RETROGRADE/HOLMIUM LASER/STENT PLACEMENT/BASKET STONE REMOVAL;  Surgeon: Raynelle Bring, MD;  Location: [facility];  Service: Urology;  Laterality: Left;  . CYSTOSCOPY/URETEROSCOPY/HOLMIUM LASER/STENT PLACEMENT Left 08/06/2016   Procedure: CYSTOSCOPY/URETEROSCOPY/HOLMIUM LASER/STENT PLACEMENT;  Surgeon: Raynelle Bring, MD;  Location: [facility];  Service: Urology;  Laterality: Left;  . CYSTOSCOPY/URETEROSCOPY/HOLMIUM LASER/STENT PLACEMENT Left 08/31/2016   Procedure: CYSTOSCOPY/URETEROSCOPY/HOLMIUM LASER/STENT PLACEMENT/BASKET STONE REMOVAL;  Surgeon: Raynelle Bring, MD;  Location: [facility];  Service: Urology;  Laterality: Left;  . FEMORAL HERNIA REPAIR  1997   left leg  . LIVER TRANSPLANTATION    . NASAL SINUS SURGERY  1997   x3  . TONSILLECTOMY  age 64  . 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 TUBAL LIGATION     Social History:   reports that she has never smoked. She has never used smokeless tobacco. She reports that she does not drink alcohol and does not use drugs.  Family History  Adopted: Yes  Problem Relation Age of Onset  . Stroke Mother   . Alzheimer's disease Mother   . Cancer Mother   . Diabetes Mother        Questionable, per new patient packet   . Arthritis Mother        Questionable, per new patient packet   . Alcoholism Father   . COPD Brother        Heavy smoker, per new patient packet   . Suicidality Sister   . Depression Sister   . COPD Sister   . OCD Daughter   . Personality disorder Daughter   . Alcoholism Son     Medications: Patient's Medications  New  Prescriptions   No medications on file  Previous Medications   ASPIRIN EC 81 MG TABLET    Take 81 mg by mouth daily.   BRIMONIDINE (ALPHAGAN P) 0.1 % SOLN    Place 1 drop into both eyes daily.    CALCIUM-VITAMIN D PO    Take 600 mg by mouth daily.   DULOXETINE (CYMBALTA) 60 MG CAPSULE    Take 1 capsule (60 mg total) by mouth daily.   FERROUS SULFATE (IRON) 325 (65 FE) MG TABS    Take 1 tablet by mouth daily.   FOLIC ACID (FOLVITE) 762 MCG TABLET    Take 400 mcg by mouth daily.   HYDROXYZINE (ATARAX/VISTARIL) 25 MG TABLET    Take 25 mg  by mouth every 6 (six) hours as needed for itching.   TACROLIMUS (PROGRAF) 0.5 MG CAPSULE    6 by mouth daily  Modified Medications   No medications on file  Discontinued Medications   TACROLIMUS (PROGRAF) 0.5 MG CAPSULE    Take 3 by mouth at bedtime    Physical Exam:  Vitals:   01/31/20 1019  BP: 140/80  Pulse: 88  Temp: (!) 96.6 F (35.9 C)  TempSrc: Temporal  SpO2: 99%  Weight: 152 lb 3.2 oz (69 kg)  Height: 5\' 3"  (1.6 m)   Body mass index is 26.96 kg/m. Wt Readings from Last 3 Encounters:  01/31/20 152 lb 3.2 oz (69 kg)  12/20/19 149 lb 12.8 oz (67.9 kg)  11/27/19 146 lb 3.2 oz (66.3 kg)    Physical Exam Constitutional:      General: She is not in acute distress.    Appearance: She is well-developed. She is not diaphoretic.  HENT:     Head: Normocephalic and atraumatic.     Mouth/Throat:     Pharynx: No oropharyngeal exudate.  Eyes:     Conjunctiva/sclera: Conjunctivae normal.     Pupils: Pupils are equal, round, and reactive to light.  Cardiovascular:     Rate and Rhythm: Normal rate and regular rhythm.     Heart sounds: Normal heart sounds.  Pulmonary:     Effort: Pulmonary effort is normal.     Breath sounds: Normal breath sounds.  Abdominal:     General: Bowel sounds are normal.     Palpations: Abdomen is soft.  Musculoskeletal:        General: No tenderness.     Cervical back: Normal range of motion and neck supple.    Skin:    General: Skin is warm and dry.  Neurological:     Mental Status: She is alert and oriented to person, place, and time.  Psychiatric:        Mood and Affect: Mood is anxious.        Cognition and Memory: Cognition is impaired.     Labs reviewed: Basic Metabolic Panel: Recent Labs    02/15/19 1542 05/16/19 1105 09/06/19 1117  NA 140 141 142  K 4.0 3.4* 3.9  CL 102 103 103  CO2 30 27 30   GLUCOSE 93 96 104*  BUN 13 14 13   CREATININE 1.01* 0.93 1.08*  CALCIUM 9.2 9.2 9.4  MG  --  2.0  --   PHOS  --  4.3  --   TSH  --   --  2.98   Liver Function Tests: Recent Labs    02/15/19 1542 05/16/19 1105 09/06/19 1117  AST 20 21 19   ALT 17 20 16   BILITOT 0.5 0.5 0.6  PROT 6.6 6.8 7.0   No results for input(s): LIPASE, AMYLASE in the last 8760 hours. No results for input(s): AMMONIA in the last 8760 hours. CBC: Recent Labs    05/16/19 1105 09/06/19 1117  WBC 7.3 7.3  NEUTROABS 4,825 4,840  HGB 14.1 14.9  HCT 40.9 44.0  MCV 89.5 91.1  PLT 191 191   Lipid Panel: Recent Labs    09/06/19 1117  CHOL 208*  HDL 56  LDLCALC 134*  TRIG 83  CHOLHDL 3.7   TSH: Recent Labs    09/06/19 1117  TSH 2.98   A1C: Lab Results  Component Value Date   HGBA1C 5.3 12/23/2018     Assessment/Plan 1. Memory loss -went over MRI again with  pt and answered questions, she is very adamant that she has had a stroke in the past but the radiologist did not "find it" discussed differences between TIA and CVA but she told me she has had 3 confirmed stokes. Educated her on importance of proper blood pressure control, cholesterol and blood sugar control. She continues on ASA 81 mg daily.   2. Right shoulder pain, unspecified chronicity resolved  3. Mixed hyperlipidemia Borderline elevated cholesterol on last labs, low cholesterol diet given, will follow up lab - Lipid Panel; Future  4. Essential hypertension -borderline today, she is very anxious at visit, encouraged  dietary modification and to monitor, goal <140/90; dash diet given.   Next appt: 02/06/2020 for fasting labs.  Carlos American. [place], [facility] [phone]

## 2020-01-31 NOTE — Patient Instructions (Addendum)
To follow up for fasting labs.    Heart-Healthy Eating Plan Many factors influence your heart (coronary) health, including eating and exercise habits. Coronary risk increases with abnormal blood fat (lipid) levels. Heart-healthy meal planning includes limiting unhealthy fats, increasing healthy fats, and making other diet and lifestyle changes.  What are tips for following this plan? Cooking Cook foods using methods other than frying. Baking, boiling, grilling, and broiling are all good options. Other ways to reduce fat include:  Removing the skin from poultry.  Removing all visible fats from meats.  Steaming vegetables in water or broth. Meal planning   At meals, imagine dividing your plate into fourths: ? Fill one-half of your plate with vegetables and green salads. ? Fill one-fourth of your plate with whole grains. ? Fill one-fourth of your plate with lean protein foods.  Eat 4-5 servings of vegetables per day. One serving equals 1 cup raw or cooked vegetable, or 2 cups raw leafy greens.  Eat 4-5 servings of fruit per day. One serving equals 1 medium whole fruit,  cup dried fruit,  cup fresh, frozen, or canned fruit, or  cup 100% fruit juice.  Eat more foods that contain soluble fiber. Examples include apples, broccoli, carrots, beans, peas, and barley. Aim to get 25-30 g of fiber per day.  Increase your consumption of legumes, nuts, and seeds to 4-5 servings per week. One serving of dried beans or legumes equals  cup cooked, 1 serving of nuts is  cup, and 1 serving of seeds equals 1 tablespoon. Fats  Choose healthy fats more often. Choose monounsaturated and polyunsaturated fats, such as olive and canola oils, flaxseeds, walnuts, almonds, and seeds.  Eat more omega-3 fats. Choose salmon, mackerel, sardines, tuna, flaxseed oil, and ground flaxseeds. Aim to eat fish at least 2 times each week.  Check food labels carefully to identify foods with trans fats or high amounts of  saturated fat.  Limit saturated fats. These are found in animal products, such as meats, butter, and cream. Plant sources of saturated fats include palm oil, palm kernel oil, and coconut oil.  Avoid foods with partially hydrogenated oils in them. These contain trans fats. Examples are stick margarine, some tub margarines, cookies, crackers, and other baked goods.  Avoid fried foods. General information  Eat more home-cooked food and less restaurant, buffet, and fast food.  Limit or avoid alcohol.  Limit foods that are high in starch and sugar.  Lose weight if you are overweight. Losing just 5-10% of your body weight can help your overall health and prevent diseases such as diabetes and heart disease.  Monitor your salt (sodium) intake, especially if you have high blood pressure. Talk with your health care provider about your sodium intake.  Try to incorporate more vegetarian meals weekly. What foods can I eat? Fruits All fresh, canned (in natural juice), or frozen fruits. Vegetables Fresh or frozen vegetables (raw, steamed, roasted, or grilled). Green salads. Grains Most grains. Choose whole wheat and whole grains most of the time. Rice and pasta, including brown rice and pastas made with whole wheat. Meats and other proteins Lean, well-trimmed beef, veal, pork, and lamb. Chicken and [place] without skin. All fish and shellfish. Wild duck, rabbit, pheasant, and venison. Egg whites or low-cholesterol egg substitutes. Dried beans, peas, lentils, and tofu. Seeds and most nuts. Dairy Low-fat or nonfat cheeses, including ricotta and mozzarella. Skim or 1% milk (liquid, powdered, or evaporated). Buttermilk made with low-fat milk. Nonfat or low-fat yogurt. Fats and  oils Non-hydrogenated (trans-free) margarines. Vegetable oils, including soybean, sesame, sunflower, olive, peanut, safflower, corn, canola, and cottonseed. Salad dressings or mayonnaise made with a vegetable oil. Beverages Water  (mineral or sparkling). Coffee and tea. Diet carbonated beverages. Sweets and desserts Sherbet, gelatin, and fruit ice. Small amounts of dark chocolate. Limit all sweets and desserts. Seasonings and condiments All seasonings and condiments. The items listed above may not be a complete list of foods and beverages you can eat. Contact a dietitian for more options. What foods are not recommended? Fruits Canned fruit in heavy syrup. Fruit in cream or butter sauce. Fried fruit. Limit coconut. Vegetables Vegetables cooked in cheese, cream, or butter sauce. Fried vegetables. Grains Breads made with saturated or trans fats, oils, or whole milk. Croissants. Sweet rolls. Donuts. High-fat crackers, such as cheese crackers. Meats and other proteins Fatty meats, such as hot dogs, ribs, sausage, bacon, rib-eye roast or steak. High-fat deli meats, such as salami and bologna. Caviar. Domestic duck and goose. Organ meats, such as liver. Dairy Cream, sour cream, cream cheese, and creamed cottage cheese. Whole milk cheeses. Whole or 2% milk (liquid, evaporated, or condensed). Whole buttermilk. Cream sauce or high-fat cheese sauce. Whole-milk yogurt. Fats and oils Meat fat, or shortening. Cocoa butter, hydrogenated oils, palm oil, coconut oil, palm kernel oil. Solid fats and shortenings, including bacon fat, salt pork, lard, and butter. Nondairy cream substitutes. Salad dressings with cheese or sour cream. Beverages Regular sodas and any drinks with added sugar. Sweets and desserts Frosting. Pudding. Cookies. Cakes. Pies. Milk chocolate or white chocolate. Buttered syrups. Full-fat ice cream or ice cream drinks. The items listed above may not be a complete list of foods and beverages to avoid. Contact a dietitian for more information. Summary  Heart-healthy meal planning includes limiting unhealthy fats, increasing healthy fats, and making other diet and lifestyle changes.  Lose weight if you are  overweight. Losing just 5-10% of your body weight can help your overall health and prevent diseases such as diabetes and heart disease.  Focus on eating a balance of foods, including fruits and vegetables, low-fat or nonfat dairy, lean protein, nuts and legumes, whole grains, and heart-healthy oils and fats. This information is not intended to replace advice given to you by your health care provider. Make sure you discuss any questions you have with your health care provider. Document Revised: 07/09/2017 Document Reviewed: 07/09/2017 Elsevier Patient Education  2020 [place] DASH stands for "Dietary Approaches to Stop Hypertension." The DASH eating plan is a healthy eating plan that has been shown to reduce high blood pressure (hypertension). It may also reduce your risk for type 2 diabetes, heart disease, and stroke. The DASH eating plan may also help with weight loss. What are tips for following this plan?  General guidelines  Avoid eating more than 2,300 mg (milligrams) of salt (sodium) a day. If you have hypertension, you may need to reduce your sodium intake to 1,500 mg a day.  Limit alcohol intake to no more than 1 drink a day for nonpregnant women and 2 drinks a day for men. One drink equals 12 oz of beer, 5 oz of wine, or 1 oz of hard liquor.  Work with your health care provider to maintain a healthy body weight or to lose weight. Ask what an ideal weight is for you.  Get at least 30 minutes of exercise that causes your heart to beat faster (aerobic exercise) most days of the week. Activities  may include walking, swimming, or biking.  Work with your health care provider or diet and nutrition specialist (dietitian) to adjust your eating plan to your individual calorie needs. Reading food labels   Check food labels for the amount of sodium per serving. Choose foods with less than 5 percent of the Daily Value of sodium. Generally, foods with less than 300 mg  of sodium per serving fit into this eating plan.  To find whole grains, look for the word "whole" as the first word in the ingredient list. Shopping  Buy products labeled as "low-sodium" or "no salt added."  Buy fresh foods. Avoid canned foods and premade or frozen meals. Cooking  Avoid adding salt when cooking. Use salt-free seasonings or herbs instead of table salt or sea salt. Check with your health care provider or pharmacist before using salt substitutes.  Do not fry foods. Cook foods using healthy methods such as baking, boiling, grilling, and broiling instead.  Cook with heart-healthy oils, such as olive, canola, soybean, or sunflower oil. Meal planning  Eat a balanced diet that includes: ? 5 or more servings of fruits and vegetables each day. At each meal, try to fill half of your plate with fruits and vegetables. ? Up to 6-8 servings of whole grains each day. ? Less than 6 oz of lean meat, poultry, or fish each day. A 3-oz serving of meat is about the same size as a deck of cards. One egg equals 1 oz. ? 2 servings of low-fat dairy each day. ? A serving of nuts, seeds, or beans 5 times each week. ? Heart-healthy fats. Healthy fats called Omega-3 fatty acids are found in foods such as flaxseeds and coldwater fish, like sardines, salmon, and mackerel.  Limit how much you eat of the following: ? Canned or prepackaged foods. ? Food that is high in trans fat, such as fried foods. ? Food that is high in saturated fat, such as fatty meat. ? Sweets, desserts, sugary drinks, and other foods with added sugar. ? Full-fat dairy products.  Do not salt foods before eating.  Try to eat at least 2 vegetarian meals each week.  Eat more home-cooked food and less restaurant, buffet, and fast food.  When eating at a restaurant, ask that your food be prepared with less salt or no salt, if possible. What foods are recommended? The items listed may not be a complete list. Talk with your  dietitian about what dietary choices are best for you. Grains Whole-grain or whole-wheat bread. Whole-grain or whole-wheat pasta. Brown rice. [place]. Bulgur. Whole-grain and low-sodium cereals. Pita bread. Low-fat, low-sodium crackers. Whole-wheat flour tortillas. Vegetables Fresh or frozen vegetables (raw, steamed, roasted, or grilled). Low-sodium or reduced-sodium tomato and vegetable juice. Low-sodium or reduced-sodium tomato sauce and tomato paste. Low-sodium or reduced-sodium canned vegetables. Fruits All fresh, dried, or frozen fruit. Canned fruit in natural juice (without added sugar). Meat and other protein foods Skinless chicken or [place]. Ground chicken or [place]. Pork with fat trimmed off. Fish and seafood. Egg whites. Dried beans, peas, or lentils. Unsalted nuts, nut butters, and seeds. Unsalted canned beans. Lean cuts of beef with fat trimmed off. Low-sodium, lean deli meat. Dairy Low-fat (1%) or fat-free (skim) milk. Fat-free, low-fat, or reduced-fat cheeses. Nonfat, low-sodium ricotta or cottage cheese. Low-fat or nonfat yogurt. Low-fat, low-sodium cheese. Fats and oils Soft margarine without trans fats. Vegetable oil. Low-fat, reduced-fat, or light mayonnaise and salad dressings (reduced-sodium). Canola, safflower, olive, soybean, and sunflower oils. Avocado. Seasoning  and other foods Herbs. Spices. Seasoning mixes without salt. Unsalted popcorn and pretzels. Fat-free sweets. What foods are not recommended? The items listed may not be a complete list. Talk with your dietitian about what dietary choices are best for you. Grains Baked goods made with fat, such as croissants, muffins, or some breads. Dry pasta or rice meal packs. Vegetables Creamed or fried vegetables. Vegetables in a cheese sauce. Regular canned vegetables (not low-sodium or reduced-sodium). Regular canned tomato sauce and paste (not low-sodium or reduced-sodium). Regular tomato and vegetable juice (not  low-sodium or reduced-sodium). Angie Fava. Olives. Fruits Canned fruit in a light or heavy syrup. Fried fruit. Fruit in cream or butter sauce. Meat and other protein foods Fatty cuts of meat. Ribs. Fried meat. Berniece Salines. Sausage. Bologna and other processed lunch meats. Salami. Fatback. Hotdogs. Bratwurst. Salted nuts and seeds. Canned beans with added salt. Canned or smoked fish. Whole eggs or egg yolks. Chicken or [place] with skin. Dairy Whole or 2% milk, cream, and half-and-half. Whole or full-fat cream cheese. Whole-fat or sweetened yogurt. Full-fat cheese. Nondairy creamers. Whipped toppings. Processed cheese and cheese spreads. Fats and oils Butter. Stick margarine. Lard. Shortening. Ghee. Bacon fat. Tropical oils, such as coconut, palm kernel, or palm oil. Seasoning and other foods Salted popcorn and pretzels. Onion salt, garlic salt, seasoned salt, table salt, and sea salt. Worcestershire sauce. Tartar sauce. Barbecue sauce. Teriyaki sauce. Soy sauce, including reduced-sodium. Steak sauce. Canned and packaged gravies. Fish sauce. Oyster sauce. Cocktail sauce. Horseradish that you find on the shelf. Ketchup. Mustard. Meat flavorings and tenderizers. Bouillon cubes. Hot sauce and Tabasco sauce. Premade or packaged marinades. Premade or packaged taco seasonings. Relishes. Regular salad dressings. Where to find more information:  National Heart, Lung, and [place]: https://wilson-eaton.com/  American Heart Association: www.heart.org Summary  The DASH eating plan is a healthy eating plan that has been shown to reduce high blood pressure (hypertension). It may also reduce your risk for type 2 diabetes, heart disease, and stroke.  With the DASH eating plan, you should limit salt (sodium) intake to 2,300 mg a day. If you have hypertension, you may need to reduce your sodium intake to 1,500 mg a day.  When on the DASH eating plan, aim to eat more fresh fruits and vegetables, whole grains, lean proteins,  low-fat dairy, and heart-healthy fats.  Work with your health care provider or diet and nutrition specialist (dietitian) to adjust your eating plan to your individual calorie needs. This information is not intended to replace advice given to you by your health care provider. Make sure you discuss any questions you have with your health care provider. Document Revised: 05/14/2017 Document Reviewed: 05/25/2016 Elsevier Patient Education  2020 [employer].

## 2020-02-06 ENCOUNTER — Other Ambulatory Visit: Payer: Self-pay

## 2020-02-06 ENCOUNTER — Other Ambulatory Visit: Payer: Medicare Other

## 2020-02-06 DIAGNOSIS — E782 Mixed hyperlipidemia: Secondary | ICD-10-CM

## 2020-02-07 ENCOUNTER — Other Ambulatory Visit: Payer: Self-pay | Admitting: *Deleted

## 2020-02-07 DIAGNOSIS — F339 Major depressive disorder, recurrent, unspecified: Secondary | ICD-10-CM

## 2020-02-07 DIAGNOSIS — F419 Anxiety disorder, unspecified: Secondary | ICD-10-CM

## 2020-02-07 LAB — LIPID PANEL
Cholesterol: 213 mg/dL — ABNORMAL HIGH (ref <200)
HDL: 55 mg/dL (ref >=50)
LDL Cholesterol (Calc): 139 mg/dL (calc) — ABNORMAL HIGH
Non-HDL Cholesterol (Calc): 158 mg/dL (calc) — ABNORMAL HIGH (ref <130)
Total CHOL/HDL Ratio: 3.9 (calc) (ref <5.0)
Triglycerides: 89 mg/dL (ref <150)

## 2020-02-07 MED ORDER — DULOXETINE HCL 60 MG PO CPEP: 60.0000 mg | ORAL_CAPSULE | Freq: Every day | ORAL | 1 refills | Status: DC

## 2020-02-07 NOTE — Telephone Encounter (Signed)
Patient requesting refill.  Pended Rx and sent to [facility] for approval.

## 2020-02-08 ENCOUNTER — Encounter: Payer: Self-pay | Admitting: Psychology

## 2020-02-08 ENCOUNTER — Other Ambulatory Visit: Payer: Self-pay

## 2020-02-08 ENCOUNTER — Ambulatory Visit: Payer: Medicare Other | Admitting: Psychology

## 2020-02-08 ENCOUNTER — Ambulatory Visit (INDEPENDENT_AMBULATORY_CARE_PROVIDER_SITE_OTHER): Payer: Medicare Other | Admitting: Psychology

## 2020-02-08 DIAGNOSIS — R4189 Other symptoms and signs involving cognitive functions and awareness: Secondary | ICD-10-CM

## 2020-02-08 DIAGNOSIS — G3184 Mild cognitive impairment, so stated: Secondary | ICD-10-CM | POA: Diagnosis not present

## 2020-02-08 HISTORY — DX: Mild cognitive impairment of uncertain or unknown etiology: G31.84

## 2020-02-08 NOTE — Progress Notes (Signed)
° °  Psychometrician Note   Cognitive testing was administered to Candace Hill by Milana Kidney, B.S. (psychometrist) under the supervision of Dr. Christia Reading, Ph.D., licensed psychologist on 02/08/20. Candace Hill did not appear overtly distressed by the testing session per behavioral observation or responses across self-report questionnaires. Dr. Christia Reading, Ph.D. checked in with Candace Hill as needed to manage any distress related to testing procedures (if applicable). Rest breaks were offered.    The battery of tests administered was selected by Dr. Christia Reading, Ph.D. with consideration to Candace Hill's current level of functioning, the nature of her symptoms, emotional and behavioral responses during interview, level of literacy, observed level of motivation/effort, and the nature of the referral question. This battery was communicated to the psychometrist. Communication between Dr. Christia Reading, Ph.D. and the psychometrist was ongoing throughout the evaluation and Dr. Christia Reading, Ph.D. was immediately accessible at all times. Dr. Christia Reading, Ph.D. provided supervision to the psychometrist on the date of this service to the extent necessary to assure the quality of all services provided.    Candace Hill will return within approximately 1-2 weeks for an interactive feedback session with Dr. Melvyn Novas at which time her test performances, clinical impressions, and treatment recommendations will be reviewed in detail. Candace Hill understands she can contact our office should she require our assistance before this time.  A total of 155 minutes of billable time were spent face-to-face with Candace Hill by the psychometrist. This includes both test administration and scoring time. Billing for these services is reflected in the clinical report generated by Dr. Christia Reading, Ph.D..  This note reflects time spent with the psychometrician and does not include test scores or any clinical  interpretations made by Dr. Melvyn Novas. The full report will follow in a separate note.

## 2020-02-08 NOTE — Progress Notes (Addendum)
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 and "within normal limits (WNL)" are used when a more specific level of functioning cannot be determined.       Effort Testing:   DESCRIPTOR       Dot Counting Test: --- --- Within Expectation  NAB EVI: --- --- Within Expectation       Orientation:      Raw Score Percentile   NAB Orientation, Form 1 24/29 --- ---       Cognitive Screening:           Raw Score Percentile   SLUMS: 27/30 --- ---       Intellectual Functioning:           Standard Score Percentile   Test of Premorbid Functioning: 100 50 Average       Memory:          NAB Memory Module, Form 2: Standard Score/ T Score Percentile   List Learning       Total Trials 1-3 15/36 (34) 5 Well Below Average    List B 3/12 (43) 25 Average    Short Delay Free Recall 4/12 (34) 5 Well Below Average    Long Delay Free Recall 5/12 (40) 16 Below Average    Retention Percentage 125 (60) 84 Above Average    Recognition Discriminability 4 (40) 16 Below Average  Shape Learning       Total Trials 1-3 10/27 (36) 8 Well Below Average    Delayed Recall 2/9 (28) 2 Exceptionally Low    Retention Percentage 50 (33) 5 Well Below Average    Recognition Discriminability 4 (36) 8 Well Below Average  Story Learning       Immediate Recall 25/80 (29) 2  Exceptionally Low    Delayed Recall 9/40 (30) 2 Well Below Average    Retention Percentage 60 (38) 12 Below Average  Daily Living Memory       Immediate Recall 38/51 (42) 21 Below Average    Delayed Recall 8/17 (30) 2 Well Below Average    Retention Percentage 62 (34) 5 Well Below Average    Recognition Hits 6/10 (34) 5 Well Below Average       Attention/Executive Function:          Trail Making Test (TMT): Raw Score (T Score) Percentile     Part A 60 secs.,  2 errors (35) 7 Well Below Average    Part B 98 secs.,  0 errors (47) 38 Average         Scaled Score Percentile   WAIS-IV Coding: 5 5 Well Below Average       NAB Attention Module, Form 1: T Score Percentile     Digits Forward 48 42 Average    Digits Backwards 44 27 Average       D-KEFS Color-Word Interference Test: Raw  Score (Scaled Score) Percentile     Color Naming 58 secs. (1) <1 Exceptionally Low    Word Reading 30 secs. (8) 25 Average    Inhibition 103 secs. (5) 5 Well Below Average      Total Errors 3 errors (10) 50 Average    Inhibition/Switching 159 secs. (1) <1 Exceptionally Low      Total Errors 8 errors (5) 5 Well Below Average       Language:          Verbal Fluency Test: Raw Score (T Score) Percentile     Phonemic Fluency (FAS) 19 (31) 3 Well Below Average    Animal Fluency 17 (48) 42 Average        NAB Language Module, Form 1: T Score Percentile     Auditory Comprehension 58 79 Above Average    Naming 30/31 (58) 79 Above Average       Visuospatial/Visuoconstruction:      Raw Score Percentile   Clock Drawing: 10/10 --- Within Normal Limits       NAB Spatial Module, Form 1: T Score Percentile     Figure Drawing Copy 67 96 Well Above Average        Scaled Score Percentile   WAIS-IV Block Design: 11 63 Average       Mood and Personality:      Raw Score Percentile   Geriatric Depression Scale: 20 --- Severe  Geriatric Anxiety Scale: 18 --- Mild    Somatic 5 --- Minimal    Cognitive 7 ---  Moderate    Affective 6 --- Mild       Additional Questionnaires:      Raw Score Percentile   PROMIS Sleep Disturbance Questionnaire: 24 --- None to Slight   Informed Consent and Coding/Compliance:   Candace Hill was provided with a verbal description of the nature and purpose of the present neuropsychological evaluation. Also reviewed were the foreseeable risks and/or discomforts and benefits of the procedure, limits of confidentiality, and mandatory reporting requirements of this provider. The patient was given the opportunity to ask questions and receive answers about the evaluation. Oral consent to participate was provided by the patient.   This evaluation was conducted by Christia Reading, Ph.D., licensed clinical neuropsychologist. Ms. Deblois completed a comprehensive clinical interview with Dr. Melvyn Novas, billed as one unit [phone], and 155 minutes of cognitive testing and scoring, billed as one unit [phone] and four additional units 96139. Psychometrist Milana Kidney, B.S., assisted Dr. Melvyn Novas with test administration and scoring procedures. As a separate and discrete service, Dr. Melvyn Novas spent a total of 160 minutes in interpretation and report writing billed as one unit [phone] and two units 96133.

## 2020-02-09 ENCOUNTER — Encounter: Payer: Self-pay | Admitting: Psychology

## 2020-02-12 ENCOUNTER — Telehealth: Payer: Self-pay

## 2020-02-12 NOTE — Telephone Encounter (Signed)
Patient called asking if Lauree Chandler, NP has looked into the fact that she had a recent scan that shows that she did not have a mini-stroke? Patient states she had 3 mini-strokes in the past, 2 were within the last few years and the other was many years ago.  Patient would like to know what your findings are  Please advise (ok to wait for Janett Billow to address on 02/13/2020)

## 2020-02-13 NOTE — Telephone Encounter (Signed)
We reviewed this at her last office visit, if she would like to have this further evaluated I would recommend discussing with her neurologist. They can personally review the scans with her.

## 2020-02-13 NOTE — Telephone Encounter (Signed)
Patient aware of Jessica's response. Patient stated she is seeing the neurologist.   Patient states she feels like this is an issue for a physician to address and asked if she can see Dr.Reed to further address this concern. Appointment scheduled on 02/22/2020 to further address with Dr.Reed.

## 2020-02-15 ENCOUNTER — Ambulatory Visit (INDEPENDENT_AMBULATORY_CARE_PROVIDER_SITE_OTHER): Payer: Medicare Other | Admitting: Psychology

## 2020-02-15 ENCOUNTER — Other Ambulatory Visit: Payer: Self-pay

## 2020-02-15 DIAGNOSIS — G3184 Mild cognitive impairment, so stated: Secondary | ICD-10-CM | POA: Diagnosis not present

## 2020-02-15 NOTE — Patient Instructions (Signed)
Recommendations: A repeat neuropsychological evaluation in 18 months (or sooner if functional decline is noted) is recommended to assess the trajectory of future cognitive decline should it occur. This will also aid in future efforts towards improved diagnostic clarity.  A combination of medication and psychotherapy has been shown to be most effective at treating symptoms of anxiety and depression. As such, Candace Hill is encouraged to speak with her prescribing physician regarding medication adjustments to optimally manage these symptoms. Likewise, Candace Hill is encouraged to consider engaging in short-term psychotherapy to address symptoms of psychiatric distress and develop coping strategies for dealing with stress. She would benefit from an active and collaborative therapeutic environment, rather than one purely supportive in nature. Recommended treatment modalities include Cognitive Behavioral Therapy (CBT) or Acceptance and Commitment Therapy (ACT).  Candace Hill is encouraged to attend to lifestyle factors for brain health (e.g., regular physical exercise, good nutrition habits, regular participation in cognitively-stimulating activities, and general stress management techniques), which are likely to have benefits for both emotional adjustment and cognition. In fact, in addition to promoting good general health, regular exercise incorporating aerobic activities (e.g., brisk walking, jogging, cycling, etc.) has been demonstrated to be a very effective treatment for depression and stress, with similar efficacy rates to both antidepressant medication and psychotherapy. Optimal control of vascular risk factors (including safe cardiovascular exercise and adherence to dietary recommendations) is encouraged.   If interested, there are some activities which have therapeutic value and can be useful in keeping her cognitively stimulated. For suggestions, Candace Hill is encouraged to go to the following website:  https://www.barrowneuro.org/get-to-know-barrow/centers-programs/neurorehabilitation-center/neuro-rehab-apps-and-games/ which has options, categorized by level of difficulty. It should be noted that these activities should not be viewed as a substitute for therapy.  When learning new information, she would benefit from information being broken up into small, manageable pieces. She may also find it helpful to articulate the material in her own words and in a context to promote encoding at the onset of a new task. This material may need to be repeated multiple times to promote encoding.  Memory can be improved using internal strategies such as rehearsal, repetition, chunking, mnemonics, association, and imagery. External strategies such as written notes in a consistently used memory journal, visual and nonverbal auditory cues such as a calendar on the refrigerator or appointments with alarm, such as on a cell phone, can also help maximize recall.    To address problems with processing speed, she may wish to consider:   -Ensuring that she is alerted when essential material or instructions are being presented   -Adjusting the speed at which new information is presented   -Allowing for more time in comprehending, processing, and responding in conversation  To address problems with fluctuating attention, she may wish to consider:   -Avoiding external distractions when needing to concentrate   -Limiting exposure to fast paced environments with multiple sensory demands   -Writing down complicated information and using checklists   -Attempting and completing one task at a time (i.e., no multi-tasking)   -Verbalizing aloud each step of a task to maintain focus   -Reducing the amount of information considered at one time

## 2020-02-15 NOTE — Progress Notes (Signed)
° °  Neuropsychology Feedback Session Candace Hill. [facility] Neu[facility]  Reason for Referral:   Candace Hill a 74 y.o. right-handed Caucasian female referred by Candace Hill, M.D.,to characterize hercurrent cognitive functioning and assist with diagnostic clarity and treatment planning in the context of subjective cognitive decline and concerns regarding prior TIA/stroke.   Feedback:   Candace Hill completed a comprehensive neuropsychological evaluation on 02/08/2020. Please refer to that encounter for the full report and recommendations. Briefly, results suggested deficits primarily surrounding phonemic fluency and encoding/retrieval aspects of memory. Performance variability was further noted across processing speed, executive functioning, and retention/consolidation aspects of memory. Regarding etiology, the most likely culprit for ongoing cognitive dysfunction is vascular in nature (i.e., mild vascular neurocognitive disorder). There appears to be some confusion regarding Candace Hill's stroke history. She was quite adamant that she was told that she has experienced three "mini-strokes" in her lifetime and that these were previously confirmed by a un-named physician. The term "mini-stroke" is problematic due to its imprecision and likely should not be used in clinical capacities as it is unclear if this represents prior TIAs (i.e., temporary blockages that can result in short-term stroke-like symptoms which do not show changes on neuroimaging) or actual very small strokes. A brain MRI on 01/16/2015 did reveal a small focal lesion in the right genu of the corpus callosum which could represent a very small ischemic infarct. This lesion is visible on her more recent brain MRI (01/21/2020) but is not commented on. Looking at her 2016 MRI, it is also worth pointing out the potential for an additional small left parieto-temporal infarct around the area of the angular gyrus which  would be chronic in nature. Overall, while unclear, there does seem to be the potential that Candace Hill has experienced at least one very small stroke in the past.  Candace Hill was unaccompanied during the current telephone call. She was within her residence while I was within my office. Content of the current session focused on the results of her neuropsychological evaluation. Candace Hill was given the opportunity to ask questions and her questions were answered. She was encouraged to reach out should additional questions arise. A copy of her report was mailed at the conclusion of the visit.      28 minutes were spent conducting the current feedback session with Candace Hill, billed as one unit [phone].

## 2020-02-22 ENCOUNTER — Ambulatory Visit (INDEPENDENT_AMBULATORY_CARE_PROVIDER_SITE_OTHER): Payer: Medicare Other | Admitting: Internal Medicine

## 2020-02-22 ENCOUNTER — Encounter: Payer: Self-pay | Admitting: Internal Medicine

## 2020-02-22 ENCOUNTER — Other Ambulatory Visit: Payer: Self-pay

## 2020-02-22 VITALS — BP 138/82 | HR 88 | Temp 97.7°F | Ht 63.0 in | Wt 150.2 lb

## 2020-02-22 DIAGNOSIS — F339 Major depressive disorder, recurrent, unspecified: Secondary | ICD-10-CM | POA: Diagnosis not present

## 2020-02-22 DIAGNOSIS — Z944 Liver transplant status: Secondary | ICD-10-CM

## 2020-02-22 DIAGNOSIS — G3184 Mild cognitive impairment, so stated: Secondary | ICD-10-CM | POA: Diagnosis not present

## 2020-02-22 NOTE — Progress Notes (Signed)
Location:  [facility] clinic Provider:  Chrystian Ressler L. Mariea Clonts, D.O., C.M.D.  Goals of Care:  Advanced Directives 02/22/2020  Does Patient Have a Medical Advance Directive? Yes  Type of Paramedic of [place];Living will  Does patient want to make changes to medical advance directive? No - Patient declined  Copy of [place] in Chart? No - copy requested  Would patient like information on creating a medical advance directive? -  Pre-existing out of facility DNR order (yellow form or pink MOST form) -     Chief Complaint  Patient presents with  . Acute Visit    Discuss MRI results and Lab results. Erline Levine (friend  ) is with her ,.    HPI: Patient is a 74 y.o. female patient of Jessica's with h/o hepatic transplant and recent cognitive decline seen today to discuss neuropsych eval results.  She is feeling optimistic to get to hear her results.  She's been cloudy lately.  She's had problems with falls.  She's had 2 prior mini strokes.  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  She's been a very detailed person in the past and she feels like she can't pull up stuff.  She's getting dizziness and vertigo.  Might even happen w/o putting her hands above her head.  She is also saying she knows she will not live long b/c she' a transplant patient.  She's here with her friend, Luster Landsberg,  to support her and make sure all folks are on the same page.   She is staying at [facility].   She has a lot of emotional stuff too b/c her daughter has some problems and she can't see her grandkids.    [name] does not sleep well.  She has urinary frequency at night.  Gets up every hour.  She does drinks a lot of water all the time.  She says has cut down in the evening--recommended stopping 2-3 hrs before bed to see how that goes.  Psychiatrist?  Janett Billow prescribed the antidepressant.  Pt does have a therapist she really likes.  They are going to  stop going to the psychiatrist.    She wound up going to [facility] ortho about her shoulder before.  They ask about who is managing her prograf prescription.  Dr at [facility] so they want to find a local GI to see.    Past Medical History:  Diagnosis Date  . Allergic rhinitis 11/14/2012  . Amblyopia of eye, left 06/17/2017  . Asbestos exposure 04/08/2018  . Asthma    [phone] due to black mold house, no asthma now  . Broken ankle    right  . Broken rib    x2  . Central retinal vein occlusion    left eye retinal occlusion  . Chronic hepatitis C virus infection 05/31/2013  . Complication of anesthesia    IV phenergan  serious mental reaction  . Depression   . Essential hypertension 10/14/2017  . Gastroesophageal reflux disease 10/14/2017  . Hernia, femoral    left  . History of blood transfusion 1971   acquired hepatitis c  . History of kidney stones   . History of liver transplant 11/14/2012  . History of nephrolithiasis 10/14/2017  . History of renal stone   . History of shingles   . Hyperglycemia 04/08/2018  . Malignant neoplasm of liver 11/14/2012  . Migraine 07/02/2015  . 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 Seasonal affective disorder   .  Tailbone injury age 65 and age 28 and few weeks ago   broken x3  . TIA (transient ischemic attack)    x3  . 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 Vitamin D deficiency 04/08/2018    Past Surgical History:  Procedure Laterality Date  . CATARACT EXTRACTION     bilateral  . CHOLECYSTECTOMY  1997  . CYSTOSCOPY W/ URETERAL STENT PLACEMENT Left 08/14/2016   Procedure: CYSTOSCOPY WITH RETROGRADE PYELOGRAM/URETERAL LEFT URETEROSCOPY AND LEFT STENT PLACEMENT;  Surgeon: Raynelle Bring, MD;  Location: [facility];  Service: Urology;  Laterality: Left;  . CYSTOSCOPY/URETEROSCOPY/HOLMIUM LASER/STENT PLACEMENT Left 07/06/2016   Procedure: CYSTOSCOPY/URETEROSCOPY/ BILATERAL  RETROGRADE/HOLMIUM LASER/STENT PLACEMENT/BASKET STONE REMOVAL;  Surgeon: Raynelle Bring, MD;  Location: [facility];  Service: Urology;  Laterality: Left;  . CYSTOSCOPY/URETEROSCOPY/HOLMIUM LASER/STENT PLACEMENT Left 08/06/2016   Procedure: CYSTOSCOPY/URETEROSCOPY/HOLMIUM LASER/STENT PLACEMENT;  Surgeon: Raynelle Bring, MD;  Location: [facility];  Service: Urology;  Laterality: Left;  . CYSTOSCOPY/URETEROSCOPY/HOLMIUM LASER/STENT PLACEMENT Left 08/31/2016   Procedure: CYSTOSCOPY/URETEROSCOPY/HOLMIUM LASER/STENT PLACEMENT/BASKET STONE REMOVAL;  Surgeon: Raynelle Bring, MD;  Location: [facility];  Service: Urology;  Laterality: Left;  . FEMORAL HERNIA REPAIR  1997   left leg  . LIVER TRANSPLANTATION    . NASAL SINUS SURGERY  1997   x3  . TONSILLECTOMY  age 61  . 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 TUBAL LIGATION      Allergies  Allergen Reactions  . Codeine Nausea Only  . 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    Marland Kitchen Penicillins Nausea Only and Other (See Comments)    Has patient had a PCN reaction causing immediate rash, facial/tongue/throat swelling, SOB or lightheadedness with hypotension: No Has patient had a PCN reaction causing severe rash involving mucus membranes or skin necrosis: No Has patient had a PCN reaction that required hospitalization No Has patient had a PCN reaction occurring within the last 10 years: No If all of the above answers are "NO", then may proceed with Cephalosporin use.  Marland Kitchen Phenergan [Promethazine] Other (See Comments)    Reaction:  Hallucinations  Pt states that she is only allergic to IV form.   . Sulfa Antibiotics Itching  . Tamsulosin Itching  . Lidocaine Palpitations    Outpatient Encounter Medications as of 02/22/2020  Medication Sig  . aspirin EC 81 MG tablet Take 81 mg by mouth daily.  . brimonidine (ALPHAGAN P) 0.1 % SOLN Place 1 drop into both eyes daily.   Marland Kitchen CALCIUM-VITAMIN D PO Take 600 mg by mouth daily.  . DULoxetine  (CYMBALTA) 60 MG capsule Take 1 capsule (60 mg total) by mouth daily.  . Ferrous Sulfate (IRON) 325 (65 Fe) MG TABS Take 1 tablet by mouth daily.  . folic acid (FOLVITE) 761 MCG tablet Take 400 mcg by mouth daily.  . hydrOXYzine (ATARAX/VISTARIL) 25 MG tablet Take 25 mg by mouth every 6 (six) hours as needed for itching.  . tacrolimus (PROGRAF) 0.5 MG capsule 6 by mouth daily   No facility-administered encounter medications on file as of 02/22/2020.    Review of Systems:  Review of Systems  Constitutional: Positive for malaise/fatigue. Negative for chills and fever.  HENT: Negative for congestion.   Eyes: Negative for blurred vision.  Respiratory: Negative for shortness of breath.   Cardiovascular: Negative for chest pain.  Gastrointestinal: Negative for abdominal pain and constipation.  Genitourinary: Negative for dysuria.  Musculoskeletal: Negative for falls.  Neurological: Positive for dizziness. Negative for loss of consciousness.  Psychiatric/Behavioral: Positive for depression and memory loss.    Health Maintenance  Topic Date Due  . TETANUS/TDAP  Never done  . PNA vac Low Risk Adult (2 of 2 - PPSV23) 07/13/2019  . INFLUENZA VACCINE  01/14/2020  . MAMMOGRAM  01/12/2021  . COLONOSCOPY  01/26/2023  . DEXA SCAN  Completed  . COVID-19 Vaccine  Completed  . Hepatitis C Screening  Completed    Physical Exam: Vitals:   02/22/20 1459  BP: 138/82  Pulse: 88  Temp: 97.7 F (36.5 C)  TempSrc: Temporal  SpO2: 98%  Weight: 150 lb 3.2 oz (68.1 kg)  Height: 5\' 3"  (1.6 m)   Body mass index is 26.61 kg/m. Physical Exam Vitals reviewed.  Constitutional:      General: She is not in acute distress.    Appearance: Normal appearance.  Cardiovascular:     Rate and Rhythm: Normal rate and regular rhythm.  Pulmonary:     Effort: Pulmonary effort is normal.     Breath sounds: Normal breath sounds.  Abdominal:     General: Bowel sounds are normal.  Musculoskeletal:         General: Normal range of motion.     Right lower leg: No edema.     Left lower leg: No edema.  Skin:    Coloration: Skin is pale.  Neurological:     General: No focal deficit present.     Mental Status: She is alert and oriented to person, place, and time.     Comments: Poor historian though and friend helps with history/clarifying what she's asking  Psychiatric:     Comments: Tearful at times     Labs reviewed: Basic Metabolic Panel: Recent Labs    05/16/19 1105 09/06/19 1117  NA 141 142  K 3.4* 3.9  CL 103 103  CO2 27 30  GLUCOSE 96 104*  BUN 14 13  CREATININE 0.93 1.08*  CALCIUM 9.2 9.4  MG 2.0  --   PHOS 4.3  --   TSH  --  2.98   Liver Function Tests: Recent Labs    05/16/19 1105 09/06/19 1117  AST 21 19  ALT 20 16  BILITOT 0.5 0.6  PROT 6.8 7.0   No results for input(s): LIPASE, AMYLASE in the last 8760 hours. No results for input(s): AMMONIA in the last 8760 hours. CBC: Recent Labs    05/16/19 1105 09/06/19 1117  WBC 7.3 7.3  NEUTROABS 4,825 4,840  HGB 14.1 14.9  HCT 40.9 44.0  MCV 89.5 91.1  PLT 191 191   Lipid Panel: Recent Labs    09/06/19 1117 02/06/20 0937  CHOL 208* 213*  HDL 56 55  LDLCALC 134* 139*  TRIG 83 89  CHOLHDL 3.7 3.9   Lab Results  Component Value Date   HGBA1C 5.3 12/23/2018    Procedures since last visit: No results found.  Assessment/Plan 1. Mild vascular neurocognitive disorder -reviewed and concurred with consultation results -encouraged her to continue to follow with Janett Billow with my support as her attending  2. Liver replaced by transplant [facility]) -may need referral to [facility] locallly--given the info and Erline Levine was looking into it.  3. Depression, recurrent ([place]) -cont cymbalta and agreed it was a good idea to see psychotherapy ongoing  Labs/tests ordered:  None added today Next appt:  F/u in 4-6 mos with Janett Billow and prn   Luci Bellucci L. Mariea Clonts,  D.O. [facility] [phone], [place] [address] Cell Phone (Mon-Fri 8am-5pm):  [phone] On Call:  [phone] & follow prompts after 5pm & weekends Office Phone:  [phone] Office Fax:  [phone]

## 2020-02-22 NOTE — Patient Instructions (Signed)
[facility] will provide third COVID-19 Vaccine for Immunocompromised: Booster shots will be at all [facility] vaccination clinics at [email or web address] You may visit that website or call [phone] Monday thru Friday from 7am to 7pm.

## 2020-02-26 ENCOUNTER — Ambulatory Visit: Payer: Medicare Other | Admitting: Nurse Practitioner

## 2020-02-28 ENCOUNTER — Telehealth: Payer: Self-pay | Admitting: *Deleted

## 2020-02-28 NOTE — Telephone Encounter (Signed)
Patient called and stated that she just wanted to let you know that she has found a Social worker. Stated that she is seeing Dr. Celesta Gentile on [facility].  FYI

## 2020-03-01 DIAGNOSIS — F411 Generalized anxiety disorder: Secondary | ICD-10-CM | POA: Diagnosis not present

## 2020-03-01 DIAGNOSIS — F331 Major depressive disorder, recurrent, moderate: Secondary | ICD-10-CM | POA: Diagnosis not present

## 2020-03-06 DIAGNOSIS — F411 Generalized anxiety disorder: Secondary | ICD-10-CM | POA: Diagnosis not present

## 2020-03-06 DIAGNOSIS — F331 Major depressive disorder, recurrent, moderate: Secondary | ICD-10-CM | POA: Diagnosis not present

## 2020-03-13 ENCOUNTER — Telehealth: Payer: Self-pay | Admitting: *Deleted

## 2020-03-13 NOTE — Telephone Encounter (Signed)
[facility] Called regarding patient's Prograf Stated that they received a refill request from [facility] back in June for Prograf take 3 tablets daily.   Patient is taking 3 tablets twice daily.  Pharmacy is requesting new Rx.   Pended Rx and sent to [facility] for approval.

## 2020-03-13 NOTE — Telephone Encounter (Signed)
Called and spoke with Terlesa at [facility] and she stated that she will let the patient know and fax the Rx to her Transplant Dr.

## 2020-03-13 NOTE — Telephone Encounter (Signed)
This prescription should be give by her transplant doctors. She needs to make sure she is following up with them.

## 2020-03-21 DIAGNOSIS — F331 Major depressive disorder, recurrent, moderate: Secondary | ICD-10-CM | POA: Diagnosis not present

## 2020-03-28 ENCOUNTER — Telehealth: Payer: Self-pay | Admitting: Nurse Practitioner

## 2020-03-28 DIAGNOSIS — Z944 Liver transplant status: Secondary | ICD-10-CM

## 2020-03-28 NOTE — Telephone Encounter (Signed)
Done

## 2020-03-28 NOTE — Telephone Encounter (Signed)
Patient is requesting a referral to Dr. Wilfrid Lund with [facility] [phone]).  She wants to see liver specialist in this area for her liver transplant care.  .  Thank you  Adan Sis

## 2020-04-03 DIAGNOSIS — F411 Generalized anxiety disorder: Secondary | ICD-10-CM | POA: Diagnosis not present

## 2020-04-03 DIAGNOSIS — F331 Major depressive disorder, recurrent, moderate: Secondary | ICD-10-CM | POA: Diagnosis not present

## 2020-04-11 DIAGNOSIS — Z1231 Encounter for screening mammogram for malignant neoplasm of breast: Secondary | ICD-10-CM | POA: Diagnosis not present

## 2020-04-16 ENCOUNTER — Encounter: Payer: Self-pay | Admitting: Family

## 2020-04-16 ENCOUNTER — Other Ambulatory Visit: Payer: Self-pay

## 2020-04-16 ENCOUNTER — Ambulatory Visit (INDEPENDENT_AMBULATORY_CARE_PROVIDER_SITE_OTHER): Payer: Medicare Other | Admitting: Family

## 2020-04-16 VITALS — BP 110/70 | HR 93 | Temp 96.6°F | Resp 16 | Ht 63.0 in | Wt 150.2 lb

## 2020-04-16 DIAGNOSIS — R059 Cough, unspecified: Secondary | ICD-10-CM | POA: Diagnosis not present

## 2020-04-16 DIAGNOSIS — R0982 Postnasal drip: Secondary | ICD-10-CM | POA: Diagnosis not present

## 2020-04-16 DIAGNOSIS — J309 Allergic rhinitis, unspecified: Secondary | ICD-10-CM

## 2020-04-16 LAB — POCT INFLUENZA A/B
Influenza A, POC: NEGATIVE
Influenza B, POC: NEGATIVE

## 2020-04-16 MED ORDER — LORATADINE 10 MG PO TABS: 10.0000 mg | ORAL_TABLET | Freq: Every day | ORAL | 11 refills | Status: DC

## 2020-04-16 MED ORDER — FLUTICASONE PROPIONATE 50 MCG/ACT NA SUSP: 2.0000 | Freq: Every day | NASAL | 6 refills | Status: DC

## 2020-04-16 NOTE — Patient Instructions (Addendum)
Dr  Hansel Feinstein Drip Postnasal drip is the feeling of mucus going down the back of your throat. Mucus is a slimy substance that moistens and cleans your nose and throat, as well as the air pockets in face bones near your forehead and cheeks (sinuses). Small amounts of mucus pass from your nose and sinuses down the back of your throat all the time. This is normal. When you produce too much mucus or the mucus gets too thick, you can feel it. Some common causes of postnasal drip include:  Having more mucus because of: ? A cold or the flu. ? Allergies. ? Cold air. ? Certain medicines.  Having more mucus that is thicker because of: ? A sinus or nasal infection. ? Dry air. ? A food allergy. Follow these instructions at home: Relieving discomfort   Gargle with a salt-water mixture 3-4 times a day or as needed. To make a salt-water mixture, completely dissolve -1 tsp of salt in 1 cup of warm water.  If the air in your home is dry, use a humidifier to add moisture to the air.  Use a saline spray or container (neti pot) to flush out the nose (nasal irrigation). These methods can help clear away mucus and keep the nasal passages moist. General instructions  Take over-the-counter and prescription medicines only as told by your health care provider.  Follow instructions from your health care provider about eating or drinking restrictions. You may need to avoid caffeine.  Avoid things that you know you are allergic to (allergens), like dust, mold, pollen, pets, or certain foods.  Drink enough fluid to keep your urine pale yellow.  Keep all follow-up visits as told by your health care provider. This is important. Contact a health care provider if:  You have a fever.  You have a sore throat.  You have difficulty swallowing.  You have headache.  You have sinus pain.  You have a cough that does not go away.  The mucus from your nose becomes thick and is green or yellow in color.  You  have cold or flu symptoms that last more than 10 days. Summary  Postnasal drip is the feeling of mucus going down the back of your throat.  If your health care provider approves, use nasal irrigation or a nasal spray 2?4 times a day.  Avoid things that you know you are allergic to (allergens), like dust, mold, pollen, pets, or certain foods. This information is not intended to replace advice given to you by your health care provider. Make sure you discuss any questions you have with your health care provider. Document Revised: 09/23/2018 Document Reviewed: 09/14/2016 Elsevier Patient Education  [place].

## 2020-04-16 NOTE — Progress Notes (Signed)
Provider: Eric Morganti FNP-C  Lauree Chandler, NP  Patient Care Team: Lauree Chandler, NP as PCP - General (Geriatric Medicine) Rolm Bookbinder, MD as Consulting Physician (Dermatology) Gerarda Fraction, MD as Referring Physician (Ophthalmology) Raynelle Bring, MD as Consulting Physician (Urology)  Extended Emergency Contact Information Primary Emergency Contact: Randa Spike [facility] Phone: [phone] Work Phone: [phone] Mobile Phone: [phone] Relation: Daughter Secondary Emergency Contact: Thomson,Stacy Address: [place], [place] [address] [address] [place] Phone: [phone] Mobile Phone: [phone] Relation: Friend  Code Status: Full Code  Goals of care: Advanced Directive information Advanced Directives 04/16/2020  Does Patient Have a Medical Advance Directive? Yes  Type of Advance Directive Living will;Healthcare Power of Attorney  Does patient want to make changes to medical advance directive? No - Patient declined  Copy of [place] in Chart? No - copy requested  Would patient like information on creating a medical advance directive? -  Pre-existing out of facility DNR order (yellow form or pink MOST form) -     Chief Complaint  Patient presents with  . Acute Visit    Cough that is minimally productive.    HPI:  Pt is a 74 y.o. female seen today for an acute visit for evaluation of cough x 1 week.Has had small amount of phlegm.cough is associated with runny nose and drainage behind her throat.she has also had pain across her forehead though states has chronic headache and dizziness.Has nasal drainage worst at night.Has had no nausea or vomiting.she denies any fever,chills,loss of sense of smell/taste or shortness of breath. Has moved to independently Living facility.Other residence have had issues with allergies.No contact with sick person.she tries to stay indoors.Wears her mask whenever  she is out.  Has completed her COVID-19 vaccine.  She is due for Influenza vaccine but states thinking twice about influenza vaccine this year.    Past Medical History:  Diagnosis Date  . Allergic rhinitis 11/14/2012  . Amblyopia of eye, left 06/17/2017  . Asbestos exposure 04/08/2018  . Asthma    [phone] due to black mold house, no asthma now  . Broken ankle    right  . Broken rib    x2  . Central retinal vein occlusion    left eye retinal occlusion  . Chronic hepatitis C virus infection 05/31/2013  . Complication of anesthesia    IV phenergan  serious mental reaction  . Depression   . Essential hypertension 10/14/2017  . Gastroesophageal reflux disease 10/14/2017  . Hernia, femoral    left  . History of blood transfusion 1971   acquired hepatitis c  . History of kidney stones   . History of liver transplant 11/14/2012  . History of nephrolithiasis 10/14/2017  . History of renal stone   . History of shingles   . Hyperglycemia 04/08/2018  . Malignant neoplasm of liver 11/14/2012  . Migraine 07/02/2015  . 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 Seasonal affective disorder   . Tailbone injury age 80 and age 70 and few weeks ago   broken x3  . TIA (transient ischemic attack)    x3  . 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 Vitamin D deficiency 04/08/2018   Past Surgical History:  Procedure Laterality Date  . CATARACT EXTRACTION     bilateral  . CHOLECYSTECTOMY  1997  . [place]  Left 08/14/2016   Procedure: CYSTOSCOPY WITH RETROGRADE PYELOGRAM/URETERAL LEFT URETEROSCOPY AND LEFT STENT PLACEMENT;  Surgeon: Raynelle Bring, MD;  Location: [facility];  Service: Urology;  Laterality: Left;  . CYSTOSCOPY/URETEROSCOPY/HOLMIUM LASER/STENT PLACEMENT Left 07/06/2016   Procedure: CYSTOSCOPY/URETEROSCOPY/ BILATERAL RETROGRADE/HOLMIUM LASER/STENT PLACEMENT/BASKET STONE REMOVAL;  Surgeon: Raynelle Bring, MD;  Location: [facility];  Service: Urology;  Laterality: Left;  . CYSTOSCOPY/URETEROSCOPY/HOLMIUM LASER/STENT PLACEMENT Left 08/06/2016   Procedure: CYSTOSCOPY/URETEROSCOPY/HOLMIUM LASER/STENT PLACEMENT;  Surgeon: Raynelle Bring, MD;  Location: [facility];  Service: Urology;  Laterality: Left;  . CYSTOSCOPY/URETEROSCOPY/HOLMIUM LASER/STENT PLACEMENT Left 08/31/2016   Procedure: CYSTOSCOPY/URETEROSCOPY/HOLMIUM LASER/STENT PLACEMENT/BASKET STONE REMOVAL;  Surgeon: Raynelle Bring, MD;  Location: [facility];  Service: Urology;  Laterality: Left;  . FEMORAL HERNIA REPAIR  1997   left leg  . LIVER TRANSPLANTATION    . NASAL SINUS SURGERY  1997   x3  . TONSILLECTOMY  age 3  . 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 TUBAL LIGATION      Allergies  Allergen Reactions  . Codeine Nausea Only  . 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    [place] Kitchen Penicillins Nausea Only and Other (See Comments)    Has patient had a PCN reaction causing immediate rash, facial/tongue/throat swelling, SOB or lightheadedness with hypotension: No Has patient had a PCN reaction causing severe rash involving mucus membranes or skin necrosis: No Has patient had a PCN reaction that required hospitalization No Has patient had a PCN reaction occurring within the last 10 years: No If all of the above answers are "NO", then may proceed with Cephalosporin use.  [place] Kitchen Phenergan [Promethazine] Other (See Comments)    Reaction:  Hallucinations  Pt states that she is only allergic to IV form.   . Sulfa Antibiotics Itching  . Tamsulosin Itching  . Lidocaine Palpitations    Outpatient Encounter Medications as of 04/16/2020  Medication Sig  . aspirin EC 81 MG tablet Take 81 mg by mouth daily.  . brimonidine (ALPHAGAN P) 0.1 % SOLN Place 1 drop into both eyes daily.   [place] Kitchen CALCIUM-VITAMIN D PO Take 600 mg by mouth daily.  . DULoxetine (CYMBALTA) 60 MG capsule Take 1 capsule (60 mg total) by mouth daily.  . Ferrous  Sulfate (IRON) 325 (65 Fe) MG TABS Take 1 tablet by mouth daily.  . folic acid (FOLVITE) 604 MCG tablet Take 400 mcg by mouth daily.  . hydrOXYzine (ATARAX/VISTARIL) 25 MG tablet Take 25 mg by mouth every 6 (six) hours as needed for itching.  . tacrolimus (PROGRAF) 0.5 MG capsule 6 by mouth daily   No facility-administered encounter medications on file as of 04/16/2020.    Review of Systems  Constitutional: Negative for appetite change, chills, fatigue and fever.  HENT: Positive for postnasal drip and rhinorrhea. Negative for congestion, sinus pressure, sinus pain, sneezing, sore throat, tinnitus and trouble swallowing.   Eyes: Negative for discharge, redness and itching.  Respiratory: Positive for cough. Negative for chest tightness, shortness of breath and wheezing.   Cardiovascular: Negative for chest pain, palpitations and leg swelling.  Gastrointestinal: Negative for abdominal distention, abdominal pain, constipation, diarrhea, nausea and vomiting.  Skin: Negative for color change, pallor and rash.  Neurological: Negative for dizziness, speech difficulty, weakness and light-headedness.       Chronic headache.  Hematological: Does not bruise/bleed easily.  Psychiatric/Behavioral: Negative for agitation and sleep disturbance.       Forgetful brain     Immunization  History  Administered Date(s) Administered  . Fluad Quad(high Dose 65+) 02/15/2019  . Influenza, Seasonal, Injecte, Preservative Fre 03/18/2009  . Influenza,inj,Quad PF,6+ Mos 04/08/2018  . Influenza-Unspecified 03/16/2017  . Moderna SARS-COVID-2 Vaccination 06/14/2019, 07/12/2019  . Pneumococcal Conjugate-13 07/12/2018   Pertinent  Health Maintenance Due  Topic Date Due  . PNA vac Low Risk Adult (2 of 2 - PPSV23) 07/13/2019  . INFLUENZA VACCINE  01/14/2020  . MAMMOGRAM  01/12/2021  . COLONOSCOPY  01/26/2023  . DEXA SCAN  Completed   Fall Risk  04/16/2020 02/22/2020 01/02/2020 12/20/2019 11/27/2019  Falls in the past  year? 0 1 1 0 1  Number falls in past yr: 0 1 0 0 0  Injury with Fall? 0 - 0 0 0  Comment - - - - -  Risk for fall due to : - - - - -  Risk for fall due to: Comment - - - - -  Follow up - - - - -   Functional Status Survey:    Vitals:   04/16/20 1353  BP: 110/70  Pulse: 93  Resp: 16  Temp: (!) 96.6 F (35.9 C)  SpO2: 98%  Weight: 150 lb 3.2 oz (68.1 kg)  Height: 5\' 3"  (1.6 m)   Body mass index is 26.61 kg/m. Physical Exam Vitals reviewed.  Constitutional:      General: She is not in acute distress.    Appearance: She is not ill-appearing.  HENT:     Head: Normocephalic.     Right Ear: Tympanic membrane, ear canal and external ear normal. There is no impacted cerumen.     Left Ear: Tympanic membrane, ear canal and external ear normal. There is no impacted cerumen.     Nose: No congestion or rhinorrhea.     Mouth/Throat:     Mouth: Mucous membranes are moist.     Pharynx: Oropharynx is clear. No posterior oropharyngeal erythema.  Eyes:     General: No scleral icterus.       Right eye: No discharge.        Left eye: No discharge.     Conjunctiva/sclera: Conjunctivae normal.     Pupils: Pupils are equal, round, and reactive to light.  Neck:     Vascular: No carotid bruit.  Cardiovascular:     Rate and Rhythm: Normal rate and regular rhythm.     Pulses: Normal pulses.     Heart sounds: Normal heart sounds. No murmur heard.  No friction rub. No gallop.   Pulmonary:     Effort: Pulmonary effort is normal. No respiratory distress.     Breath sounds: Normal breath sounds. No wheezing, rhonchi or rales.  Chest:     Chest wall: No tenderness.  Abdominal:     General: Bowel sounds are normal. There is no distension.     Palpations: Abdomen is soft. There is no mass.     Tenderness: There is no abdominal tenderness. There is no guarding.  Musculoskeletal:        General: No swelling or tenderness. Normal range of motion.     Cervical back: Normal range of motion. No  rigidity or tenderness.     Right lower leg: No edema.     Left lower leg: No edema.  Lymphadenopathy:     Cervical: No cervical adenopathy.  Skin:    General: Skin is warm and dry.     Coloration: Skin is not pale.     Findings: No bruising, erythema or  rash.  Neurological:     Mental Status: She is alert and oriented to person, place, and time.     Cranial Nerves: No cranial nerve deficit.     Motor: No weakness.     Gait: Gait normal.  Psychiatric:        Mood and Affect: Mood normal.        Speech: Speech normal.        Behavior: Behavior normal.        Thought Content: Thought content normal.     Labs reviewed: Recent Labs    05/16/19 1105 09/06/19 1117  NA 141 142  K 3.4* 3.9  CL 103 103  CO2 27 30  GLUCOSE 96 104*  BUN 14 13  CREATININE 0.93 1.08*  CALCIUM 9.2 9.4  MG 2.0  --   PHOS 4.3  --    Recent Labs    05/16/19 1105 09/06/19 1117  AST 21 19  ALT 20 16  BILITOT 0.5 0.6  PROT 6.8 7.0   Recent Labs    05/16/19 1105 09/06/19 1117  WBC 7.3 7.3  NEUTROABS 4,825 4,840  HGB 14.1 14.9  HCT 40.9 44.0  MCV 89.5 91.1  PLT 191 191   Lab Results  Component Value Date   TSH 2.98 09/06/2019   Lab Results  Component Value Date   HGBA1C 5.3 12/23/2018   Lab Results  Component Value Date   CHOL 213 (H) 02/06/2020   HDL 55 02/06/2020   LDLCALC 139 (H) 02/06/2020   TRIG 89 02/06/2020   CHOLHDL 3.9 02/06/2020    Significant Diagnostic Results in last 30 days:  No results found.  Assessment/Plan  1. Allergic rhinitis with postnasal drip Afebrile.Post nasal drainage. - encouraged to increase fluid intake and cut down around 6 pm to prevent sleep disturbance at night.  - Advised to take OTC loratadine to help with runny nose.side effects discussed.Flonase for PND   - loratadine (CLARITIN) 10 MG tablet; Take 1 tablet (10 mg total) by mouth daily.  Dispense: 30 tablet; Refill: 11 - fluticasone (FLONASE) 50 MCG/ACT nasal spray; Place 2 sprays into  both nostrils daily.  Dispense: 16 g; Refill: 6 - Advised to notify provider if symptoms fail to improve or worsen.   2. Cough Afebrile. Suspect due to irritation from postnasal drip.bilateral lungs clear to auscultation.will rule out influenza and COVID-19  - POC Influenza A/B - SARS-COV-2 RNA,(COVID-19) QUAL NAAT  Family/ staff Communication: Reviewed plan of care with patient  Labs/tests ordered: None   Next Appointment: As needed if symptoms worsen or fail to improve.   Sandrea Hughs, NP

## 2020-04-17 LAB — SARS-COV-2 RNA,(COVID-19) QUALITATIVE NAAT: SARS CoV2 RNA: NOT DETECTED

## 2020-04-19 DIAGNOSIS — Z87442 Personal history of urinary calculi: Secondary | ICD-10-CM | POA: Diagnosis not present

## 2020-04-29 DIAGNOSIS — F331 Major depressive disorder, recurrent, moderate: Secondary | ICD-10-CM | POA: Diagnosis not present

## 2020-05-03 DIAGNOSIS — H348122 Central retinal vein occlusion, left eye, stable: Secondary | ICD-10-CM | POA: Diagnosis not present

## 2020-05-03 DIAGNOSIS — Z961 Presence of intraocular lens: Secondary | ICD-10-CM | POA: Diagnosis not present

## 2020-05-03 DIAGNOSIS — H40013 Open angle with borderline findings, low risk, bilateral: Secondary | ICD-10-CM | POA: Diagnosis not present

## 2020-05-03 DIAGNOSIS — I10 Essential (primary) hypertension: Secondary | ICD-10-CM | POA: Diagnosis not present

## 2020-05-23 ENCOUNTER — Encounter: Payer: Self-pay | Admitting: Gastroenterology

## 2020-05-23 ENCOUNTER — Ambulatory Visit (INDEPENDENT_AMBULATORY_CARE_PROVIDER_SITE_OTHER): Payer: Medicare Other | Admitting: Gastroenterology

## 2020-05-23 VITALS — BP 136/78 | HR 88 | Ht 63.0 in | Wt 152.6 lb

## 2020-05-23 DIAGNOSIS — Z944 Liver transplant status: Secondary | ICD-10-CM | POA: Diagnosis not present

## 2020-05-23 DIAGNOSIS — Z79899 Other long term (current) drug therapy: Secondary | ICD-10-CM

## 2020-05-23 NOTE — Patient Instructions (Signed)
[facility] LIVER TRANSPLANT [place]  [place], [place] [address]  [phone]  Marice Potter, [place]  [place], [place] [address]  [phone]  [phone] (Fax)  Liver transplanted (CMS-HCC) (Primary Dx)   If you are age 72 or older, your body mass index should be between 23-30. Your Body mass index is 27.03 kg/m. If this is out of the aforementioned range listed, please consider follow up with your Primary Care Provider.  If you are age 59 or younger, your body mass index should be between 19-25. Your Body mass index is 27.03 kg/m. If this is out of the aformentioned range listed, please consider follow up with your Primary Care Provider.   It was a pleasure to see you today!  Dr. Loletha Carrow

## 2020-05-23 NOTE — Progress Notes (Signed)
[facility] Progress Note  Chief Complaint: Status post liver transplant  Subjective  History: Patient was seen once on 03/14/2019 for altered bowel habits with small, soft pellet-like stools, sometimes of a "jelly" consistency with urgency and small volume incontinence.  She had feelings of incomplete evacuation.  Colonoscopy reportedly about 8 years prior to that (?  Dr. Collene Mares).  Patient was referred to [facility] surgery for the fecal incontinence, and I recommended a colonoscopy with [place], but she decided not to pursue that at the time. 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 _______________ Candace Hill was here with a friend today, who has been trying to help her navigate medical care and her life in general.  Candace Hill still has not had follow-up with [facility] hepatology/liver transplant throughout the pandemic, but it appears that they have been refilling her Prograf.  She says they wanted to have a liver biopsy, but she does not want to go there because [facility] is a "designated Covid hospital".  As before, she has a tangential and circumferential thought pattern and it was somewhat difficult to communicate today.  She had questions about whether or not she should be taking various supplements that she brought today. She denies abdominal pain or bowel habits or rectal bleeding.  She is fixated on perceived mistakes in failings of some medical care she may have received around the time or after her liver transplant.  (It took our CMA 30 minutes to do the intake for this patient)  ROS: Cardiovascular:  no chest pain Respiratory: no dyspnea Denies abdominal pain or rash She has unspecified "eye problems" The patient's Past Medical, Family and Social History were reviewed and are on file in the EMR.  Objective:  Med list reviewed  Current Outpatient Medications:  .  aspirin EC 81 MG tablet, Take 81 mg by mouth  daily., Disp: , Rfl:  .  Biotin 800 MCG TABS, Take 1 tablet by mouth., Disp: , Rfl:  .  CALCIUM-VITAMIN D PO, Take 600 mg by mouth daily., Disp: , Rfl:  .  DULoxetine (CYMBALTA) 60 MG capsule, Take 1 capsule (60 mg total) by mouth daily., Disp: 90 capsule, Rfl: 1 .  Ferrous Sulfate (IRON) 325 (65 Fe) MG TABS, Take 1 tablet by mouth daily., Disp: , Rfl:  .  folic acid (FOLVITE) 659 MCG tablet, Take 400 mcg by mouth daily., Disp: , Rfl:  .  hydrOXYzine (ATARAX/VISTARIL) 25 MG tablet, Take 25 mg by mouth every 6 (six) hours as needed for itching., Disp: , Rfl:  .  tacrolimus (PROGRAF) 0.5 MG capsule, 6 by mouth daily NAME BRAND ONLY, Disp: , Rfl:    Vital signs in last 24 hrs: Vitals:   05/23/20 1441  BP: 136/78  Pulse: 88    Physical Exam  Poor eye contact, tearful at times Sclera anicteric No jaundice  Labs:  CMP Latest Ref Rng & Units 09/06/2019 05/16/2019 02/15/2019  Glucose 65 - 99 mg/dL 104(H) 96 93  BUN 7 - 25 mg/dL 13 14 13   Creatinine 0.60 - 0.93 mg/dL 1.08(H) 0.93 1.01(H)  Sodium 135 - 146 mmol/L 142 141 140  Potassium 3.5 - 5.3 mmol/L 3.9 3.4(L) 4.0  Chloride 98 - 110 mmol/L 103 103 102  CO2 20 - 32 mmol/L 30 27 30   Calcium 8.6 - 10.4 mg/dL 9.4 9.2 9.2  Total Protein 6.1 - 8.1 g/dL 7.0 6.8 6.6  Total  Bilirubin 0.2 - 1.2 mg/dL 0.6 0.5 0.5  Alkaline Phos 38 - 126 U/L - - -  AST 10 - 35 U/L 19 21 20   ALT 6 - 29 U/L 16 20 17    CBC Latest Ref Rng & Units 09/06/2019 05/16/2019 11/08/2018  WBC 3.8 - 10.8 Thousand/uL 7.3 7.3 8.9  Hemoglobin 11.7 - 15.5 g/dL 14.9 14.1 15.2  Hematocrit 35.0 - 45.0 % 44.0 40.9 44.3  Platelets 140 - 400 Thousand/uL 191 191 216    ___________________________________________ Radiologic studies:   ____________________________________________ Other:   _____________________________________________ Assessment & Plan  Assessment: Encounter Diagnoses  Name Primary?  . S/P liver transplant ([facility]) Yes  . Long term current use of  immunosuppressive drug    Many years out from OLT, and fortunately she has done well on Prograf.  She has been nonadherent to follow-up at her transplant center, and I stressed the importance of that.  She asked if I would take over management of that, but I stressed that it must be done with a dedicated transplant hepatology clinic.  They would know best which medicine to use at what dose and what monitoring is necessary in the way of lab work and periodic liver biopsy.  They would also have specialty pathologists for any biopsy that would be necessary.  The main issue appears to be her ongoing anxiety related to Covid risk, but I hope she will be able to work through that with the help of her friend in order to reestablish care with [facility] transplant hematology in the near future.  We were able to find that clinic's phone number and provided to her friend today.   20 minutes were spent on this encounter (including chart review, history/exam, counseling/coordination of care, and documentation)  Nelida Meuse III

## 2020-05-27 DIAGNOSIS — I10 Essential (primary) hypertension: Secondary | ICD-10-CM | POA: Diagnosis not present

## 2020-05-27 DIAGNOSIS — H40013 Open angle with borderline findings, low risk, bilateral: Secondary | ICD-10-CM | POA: Diagnosis not present

## 2020-05-27 DIAGNOSIS — H348122 Central retinal vein occlusion, left eye, stable: Secondary | ICD-10-CM | POA: Diagnosis not present

## 2020-05-27 DIAGNOSIS — Z961 Presence of intraocular lens: Secondary | ICD-10-CM | POA: Diagnosis not present

## 2020-05-29 DIAGNOSIS — F331 Major depressive disorder, recurrent, moderate: Secondary | ICD-10-CM | POA: Diagnosis not present

## 2020-05-29 DIAGNOSIS — F411 Generalized anxiety disorder: Secondary | ICD-10-CM | POA: Diagnosis not present

## 2020-05-30 ENCOUNTER — Telehealth: Payer: Self-pay | Admitting: *Deleted

## 2020-05-30 DIAGNOSIS — Z79899 Other long term (current) drug therapy: Principal | ICD-10-CM

## 2020-05-30 DIAGNOSIS — Z944 Liver transplant status: Principal | ICD-10-CM

## 2020-05-30 MED ORDER — PROGRAF 0.5 MG CAPSULE
ORAL_CAPSULE | Freq: Two times a day (BID) | ORAL | 1 refills | 30.00000 days | Status: CP
Start: 2020-05-30 — End: 2020-05-31

## 2020-05-30 NOTE — Telephone Encounter (Signed)
Mickel Baas returned call and stated that every 3 months is fine but patient Candace Hill need to do sooner depending on results.

## 2020-05-30 NOTE — Telephone Encounter (Signed)
Called Mickel Baas to confirm Provider to cc results to. LMOM to return call.   Patient stated that it was Dr. Verne Spurr at [facility].  Awaiting callback from Nurse.

## 2020-05-30 NOTE — Telephone Encounter (Signed)
I think every 3 months would be okay to get in our office, just make sure we know what provider we need to fwd the results to.

## 2020-05-30 NOTE — Telephone Encounter (Signed)
Mickel Baas called back and confirmed the Dr. Is Darylene Price.   Labs pended and sent to [facility] for approval. Cc Dr. Darylene Price on Labwork orders.

## 2020-05-30 NOTE — Telephone Encounter (Signed)
Received fax from Harless Nakayama, RN with [facility] Liver Transplant [facility] [phone] Fax: [phone]  Order Requested for: CBC with differential CMP Magnesium and Phosphorus Direct Bili and GGT Tacrolimus Level   Frequency: Weekly (start date 05/30/20-05/30/2021)  ICD10: Z94.4, Z79.899  Wants results faxed to [phone]  (Order fax placed in [place] folder for review.)

## 2020-05-30 NOTE — Telephone Encounter (Signed)
LMOM for Candace Hill to return call.

## 2020-05-30 NOTE — Telephone Encounter (Signed)
Can we clarify weekly? I am looking in epic at the telephone note from the RN and it says every 3 month labs.

## 2020-05-30 NOTE — Telephone Encounter (Signed)
Patient called and stated that she spokw with Mickel Baas, Transplant Coordinator in [facility] today. Stated that she is going to call in her Prograf medication to the pharmacy but she is requesting patient to have labwork done ASAP to check her liver enzymes and regular labs. Stated that they are not going to make her have a liver Bx now but she has to promise to get the labwork done.  Patient stated that they told her to take her medication differently, Take 3 at 9am and Take 3 at 9pm. Told her to split it up so it doesn't hurt her kidney's   Patient stated that she went to [place] but they were unbelievingly rude and not willing to help her.   Needs lab orders placed.  Please Advise.   (Telephone Note from Harless Nakayama, [facility] Transplant, are in [place] dated 05/30/20)

## 2020-05-31 DIAGNOSIS — Z79899 Other long term (current) drug therapy: Principal | ICD-10-CM

## 2020-05-31 DIAGNOSIS — Z944 Liver transplant status: Principal | ICD-10-CM

## 2020-05-31 MED ORDER — TACROLIMUS 0.5 MG CAPSULE, IMMEDIATE-RELEASE: 2 mg | capsule | Freq: Two times a day (BID) | 1 refills | 30 days | Status: AC

## 2020-05-31 MED ORDER — TACROLIMUS 0.5 MG CAPSULE, IMMEDIATE-RELEASE
ORAL_CAPSULE | Freq: Two times a day (BID) | ORAL | 1 refills | 30.00000 days | Status: CP
Start: 2020-05-31 — End: 2020-05-31

## 2020-05-31 MED ORDER — TACROLIMUS 0.5 MG CAPSULE, IMMEDIATE-RELEASE: 2 mg | capsule | Freq: Two times a day (BID) | 0 refills | 10 days | Status: AC

## 2020-05-31 NOTE — Telephone Encounter (Signed)
Patient notified and labs scheduled for Tuesday at 8:30

## 2020-05-31 NOTE — Telephone Encounter (Signed)
Orders placed.

## 2020-06-04 ENCOUNTER — Other Ambulatory Visit: Payer: Medicare Other

## 2020-06-04 ENCOUNTER — Other Ambulatory Visit: Payer: Self-pay

## 2020-06-04 ENCOUNTER — Telehealth: Payer: Self-pay | Admitting: Nurse Practitioner

## 2020-06-04 DIAGNOSIS — Z79899 Other long term (current) drug therapy: Secondary | ICD-10-CM

## 2020-06-04 DIAGNOSIS — Z944 Liver transplant status: Secondary | ICD-10-CM | POA: Diagnosis not present

## 2020-06-04 MED ORDER — PROGRAF 0.5 MG CAPSULE
ORAL_CAPSULE | Freq: Two times a day (BID) | ORAL | 0 refills | 10 days | Status: CP
Start: 2020-06-04 — End: 2020-06-06

## 2020-06-04 NOTE — Telephone Encounter (Signed)
Candace Hill wanted [place] to mail her all of her lab results from her lab visit on 12/21 and call her with her results also!

## 2020-06-05 ENCOUNTER — Other Ambulatory Visit: Payer: Self-pay | Admitting: Family

## 2020-06-05 ENCOUNTER — Other Ambulatory Visit: Payer: Self-pay | Admitting: *Deleted

## 2020-06-05 DIAGNOSIS — F419 Anxiety disorder, unspecified: Secondary | ICD-10-CM

## 2020-06-05 DIAGNOSIS — F339 Major depressive disorder, recurrent, unspecified: Secondary | ICD-10-CM

## 2020-06-05 LAB — CBC WITH DIFFERENTIAL/PLATELET
Absolute Monocytes: 605 cells/uL (ref 200–950)
Basophils Absolute: 74 cells/uL (ref 0–200)
Basophils Relative: 0.8 %
Eosinophils Absolute: 214 cells/uL (ref 15–500)
Eosinophils Relative: 2.3 %
HCT: 40.2 % (ref 35.0–45.0)
Hemoglobin: 13.9 g/dL (ref 11.7–15.5)
Lymphs Abs: 2065 cells/uL (ref 850–3900)
MCH: 31.2 pg (ref 27.0–33.0)
MCHC: 34.6 g/dL (ref 32.0–36.0)
MCV: 90.3 fL (ref 80.0–100.0)
MPV: 10.8 fL (ref 7.5–12.5)
Monocytes Relative: 6.5 %
Neutro Abs: 6343 cells/uL (ref 1500–7800)
Neutrophils Relative %: 68.2 %
Platelets: 191 10*3/uL (ref 140–400)
RBC: 4.45 10*6/uL (ref 3.80–5.10)
RDW: 13.5 % (ref 11.0–15.0)
Total Lymphocyte: 22.2 %
WBC: 9.3 10*3/uL (ref 3.8–10.8)

## 2020-06-05 LAB — GAMMA GT: GGT: 19 U/L (ref 3–65)

## 2020-06-05 LAB — COMPLETE METABOLIC PANEL WITH GFR
AG Ratio: 2 (calc) (ref 1.0–2.5)
ALT: 17 U/L (ref 6–29)
AST: 17 U/L (ref 10–35)
Albumin: 4.6 g/dL (ref 3.6–5.1)
Alkaline phosphatase (APISO): 58 U/L (ref 37–153)
BUN/Creatinine Ratio: 16 (calc) (ref 6–22)
BUN: 16 mg/dL (ref 7–25)
CO2: 29 mmol/L (ref 20–32)
Calcium: 9.8 mg/dL (ref 8.6–10.4)
Chloride: 102 mmol/L (ref 98–110)
Creat: 1.03 mg/dL — ABNORMAL HIGH (ref 0.60–0.93)
GFR, Est African American: 63 mL/min/{1.73_m2} (ref >=60)
GFR, Est Non African American: 54 mL/min/{1.73_m2} — ABNORMAL LOW (ref >=60)
Globulin: 2.3 g/dL (calc) (ref 1.9–3.7)
Glucose, Bld: 92 mg/dL (ref 65–99)
Potassium: 3.3 mmol/L — ABNORMAL LOW (ref 3.5–5.3)
Sodium: 141 mmol/L (ref 135–146)
Total Bilirubin: 0.5 mg/dL (ref 0.2–1.2)
Total Protein: 6.9 g/dL (ref 6.1–8.1)

## 2020-06-05 LAB — PHOSPHORUS: Phosphorus: 4.6 mg/dL — ABNORMAL HIGH (ref 2.1–4.3)

## 2020-06-05 LAB — BILIRUBIN, DIRECT: Bilirubin, Direct: 0.1 mg/dL (ref 0.0–0.2)

## 2020-06-05 LAB — TACROLIMUS LEVEL: Tacrolimus (FK506), Blood: 7.1 ng/mL

## 2020-06-05 LAB — MAGNESIUM: Magnesium: 1.9 mg/dL (ref 1.5–2.5)

## 2020-06-05 MED ORDER — DULOXETINE HCL 60 MG PO CPEP
60.0000 mg | ORAL_CAPSULE | Freq: Every day | ORAL | 1 refills | Status: DC
Start: 2020-06-05 — End: 2020-06-05

## 2020-06-05 MED ORDER — DULOXETINE HCL 60 MG PO CPEP: 60.0000 mg | ORAL_CAPSULE | Freq: Every day | ORAL | 1 refills | Status: DC

## 2020-06-05 NOTE — Addendum Note (Signed)
Addended by: Logan Bores on: 06/05/2020 04:33 PM   Modules accepted: Orders

## 2020-06-05 NOTE — Telephone Encounter (Signed)
Patient is requesting refill Pended Rx and sent to [place] for approval Janett Billow out of office)  Patient was very confused to which pharmacy she wanted the Rx sent to.

## 2020-06-06 DIAGNOSIS — Z79899 Other long term (current) drug therapy: Principal | ICD-10-CM

## 2020-06-06 DIAGNOSIS — Z944 Liver transplant status: Principal | ICD-10-CM

## 2020-06-06 MED ORDER — TACROLIMUS 0.5 MG CAPSULE, IMMEDIATE-RELEASE
ORAL_CAPSULE | Freq: Two times a day (BID) | ORAL | 11 refills | 30 days | Status: CP
Start: 2020-06-06 — End: 2020-08-05
  Filled 2020-06-06: qty 60, 10d supply, fill #0

## 2020-06-06 MED FILL — TACROLIMUS 0.5 MG CAPSULE, IMMEDIATE-RELEASE: 10 days supply | Qty: 60 | Fill #0 | Status: AC

## 2020-06-06 NOTE — Unmapped (Signed)
Followed up with [facility] regarding PA, after prograf rx re-sent. They had forwarded to the wrong number, but agreed to fax over yesterday. PA forwarded today to [employer] specialist, Diannia Ruder, who said prograf approved under part B medicare. Contacted [facility] to run test claim, but told it was not approved. Contacted medicare who agreed to fax prograf appeal to Txp dept today. Spoke to patient who said she was willing to use generic now, whereas before she was told she should only use brand prograf. Explained that was preferable during the 1st yr of txp if possible, but with her being so many yrs out from txp and the increased reliability of generics, it was approved by all our txp providers, even for new txp pts. Let her know Rx for generic tac would be sent to [facility] for expedited PA as well. Contacted pharmacy who processed the PA. Discussed 340B approval which would not be possible before the holiday weekend. Per Vernice Jefferson, patient could use discount card for generic med and pay on acct in the interim. Spoke with patient who approved of plan and was available now for onboarding now for shipment today.

## 2020-06-06 NOTE — Unmapped (Signed)
This onboarding is for the following medication:  1) Prograf      [facility]   Patient Onboarding/Medication Counseling    Kristy Hanson is a 74 y.o. female with a liver transplant who I am counseling today on continuation of therapy.  I am speaking to the patient.    Was a Nurse, learning disability used for this call? No    Verified patient's date of birth / HIPAA.    Specialty medication(s) to be sent: Transplant: tacrolimus 0.5mg       Non-specialty medications/supplies to be sent: none      Medications not needed at this time: none       The patient declined counseling on missed dose instructions, goals of therapy, side effects and monitoring parameters, warnings and precautions, drug/food interactions and storage, handling precautions, and disposal because they have taken the medication previously. The information in the declined sections below are for informational purposes only and was not discussed with patient.     Prograf (tacrolimus)    Medication & Administration     Dosage: Take 3 capsules (1.5mg  total) two times a day.     Administration:   ??? May take with or without food  ??? Take 12 hours apart    Adherence/Missed dose instructions:  ??? Take a missed dose as soon as you think about it.  ??? If it is close to the time for your next dose, skip the missed dose and go back to your normal time.  ??? Do not take 2 doses at the same time or extra doses.    Goals of Therapy     ??? To prevent organ rejection    Side Effects & Monitoring Parameters     ??? Common side effects  ??? Dizziness  ??? Fatigue  ??? Headache  ??? Stuffy nose or sore throat  ??? Nausea, vomiting, stomach pain, diarrhea, constipation  ??? Heartburn  ??? Back or joint pain  ??? Increased risk of infection    ??? The following side effects should be reported to the provider:  ??? Allergic reaction  ??? Kidney issues (change in quantity or urine passed, blood in urine, or weight gain)  ??? High blood pressure (dizziness, change in eyesight, headache)  ??? Electrolyte issues (change in mood, confusion, muscle pain, or weakness)  ??? Abnormal breathing  ??? Shakiness  ??? Unexplained bleeding or bruising (gums bleeding, blood in urine, nosebleeds, any abnormal bleeding)  ??? Signs of infection (fever, cough, wounds that will not heal)  ??? Skin changes (sores, paleness, new or changed bumps or moles)    ??? Monitoring Parameters  ??? Renal function  ??? Liver function  ??? Glucose levels  ??? Blood pressure  ??? Tacrolimus trough levels  ??? Cardiac monitoring (for QT prolongation)      Contraindications, Warnings, & Precautions     ??? Black Box Warning: Infections - immunosuppressant agents increase the risk of infection that may lead to hospitalization or death  ??? Black Box Warning: Malignancy - immunosuppressant agents may be associated with the development of malignancies that may lead to hospitalization or death  ??? Limit or avoid sun and ultraviolet light exposure, use appropriate sun protection  ??? Myocardial hypertrophy -avoid use in patients with congenital long QT syndrome  ??? Diabetes mellitus - the risk for new-onset diabetes and insulin-dependent post-transplant diabetes mellitus is increased with tacrolimus use after transplantation  ??? GI perforation  ??? Hyperkalemia  ??? Hypertension  ??? Nephrotoxicity  ??? Neurotoxicity  ???  This is a narrow therapeutic index drug. Do not switch manufacturers without first talking to the provider.    Drug/Food Interactions     ??? Medication list reviewed in Epic. The patient was instructed to inform the care team before taking any new medications or supplements. No drug interactions identified.   ??? Avoid alcohol  ??? Avoid grapefruit or grapefruit juice  ??? Avoid live vaccines    Storage, Handling Precautions, & Disposal     ??? Store at room temperature  ??? Keep away from children and pets      Current Medications (including OTC/herbals), Comorbidities and Allergies     Current Outpatient Medications   Medication Sig Dispense Refill   ??? ALPHAGAN P 0.1 % Drop Administer 1 drop to both eyes Two (2) times a day.     ??? aspirin (ECOTRIN) 81 MG tablet Take 81 mg by mouth daily.     ??? calcium carbonate 300 mg (750 mg) Chew Chew.     ??? cholecalciferol, vitamin D3, (VITAMIN D3) 1,000 unit capsule Take 1,000 Units by mouth daily.     ??? DULoxetine (CYMBALTA) 60 MG capsule Take 60 mg by mouth daily.     ??? ferrous sulfate 325 (65 FE) MG tablet Take 325 mg by mouth daily with breakfast.     ??? folic acid (FOLVITE) 1 MG tablet Take 400 mcg by mouth daily.      ??? ledipasvir 90 mg-sofosbuvir 400 mg (HARVONI) tablet Take 1 tablet by mouth daily. (Patient not taking: Reported on 03/30/2017) 28 tablet 2   ??? potassium citrate (UROCIT-K 15) 15 mEq TbER Take 2 tablets by mouth Two (2) times a day.      ??? propranolol (INNOPRAN XL) 80 MG 24 hr capsule Take 80 mg by mouth nightly.     ??? pyridoxine (B-6) 100 MG tablet Take 100 mg by mouth daily.     ??? rizatriptan (MAXALT-MLT) 5 MG disintegrating tablet Take 5 mg by mouth once as needed for migraine. May repeat in 2 hours if needed     ??? tacrolimus (PROGRAF) 0.5 MG capsule Take 3 capsules (1.5 mg total) by mouth two (2) times a day. 180 capsule 11   ??? traMADol (ULTRAM) 50 mg tablet Take 50 mg by mouth every six (6) hours as needed for pain.       No current facility-administered medications for this visit.       Allergies   Allergen Reactions   ??? Avastin [Bevacizumab] Other (See Comments)     Mini stroke   ??? Prednisone Other (See Comments)     Patient was told by her ophthalmologist that the vision loss she had in one of her eyes was likely a result of high dose steroids. She also reports insomnia/jitters when taking it.   ??? Amoxicillin-Pot Clavulanate      Other reaction(s): NAUSEA   ??? Codeine      Other reaction(s): NAUSEA   ??? Penicillins      Other reaction(s): UNKNOWN  * Pt would like to know why she shouldn't take this*   ??? Procaine      Other reaction(s): dizziness   ??? Promethazine      oversedation   ??? Sulfa (Sulfonamide Antibiotics)      Other reaction(s): ITCHING   ??? Lidocaine Palpitations   ??? Tamsulosin Itching       Patient Active Problem List   Diagnosis   ??? Chronic hepatitis C virus infection (CMS-HCC)  Reviewed and up to date in Epic.    Appropriateness of Therapy     Is medication and dose appropriate based on diagnosis? Yes    Prescription has been clinically reviewed: Yes    Baseline Quality of Life Assessment      How many days over the past month did your liver transplant  keep you from your normal activities? For example, brushing your teeth or getting up in the morning. 0    Financial Information     Medication Assistance provided: None Required    Anticipated copay of $44.90 reviewed with patient. Verified delivery address.    Delivery Information     Scheduled delivery date: 06/07/20    Expected start date: 06/07/20    Medication will be delivered via UPS to the prescription address in [facility].  This shipment will not require a signature.      Explained the services we provide at [facility] and that each month we would call to set up refills.  Stressed importance of returning phone calls so that we could ensure they receive their medications in time each month.  Informed patient that we should be setting up refills 7-10 days prior to when they will run out of medication.  A pharmacist will reach out to perform a clinical assessment periodically.  Informed patient that a welcome packet and a drug information handout will be sent.      Patient verbalized understanding of the above information as well as how to contact the pharmacy at [phone] option [phone] with any questions/concerns.  The pharmacy is open Monday through Friday 8:30am-4:30pm.  A pharmacist is available 24/7 via pager to answer any clinical questions they may have.    Patient Specific Needs     - Does the patient have any physical, cognitive, or cultural barriers? No    - Patient prefers to have medications discussed with  Patient     - Is the patient or caregiver able to read and understand education materials at a high school level or above? Yes    - Patient's primary language is  English     - Is the patient high risk? No    - Does the patient require a Care Management Plan? No     - Does the patient require physician intervention or other additional services (i.e. nutrition, smoking cessation, social work)? No      Tera Helper  [facility] Specialty Pharmacist

## 2020-06-10 NOTE — Unmapped (Addendum)
[facility] Specialty Medication Onboarding    Specialty Medication: Tacrolimus 1mg  Capsule  Prior Authorization: Not Required   Financial Assistance: No - copay  <$25  Final Copay/Day Supply: $0 / 30 days    Insurance Restrictions: Yes - max 1 month supply     Notes to Pharmacist: Medicare part B has updated their system to reflect that the patient did have coverage upon receipt of her transplant.    The triage team has completed the benefits investigation and has determined that the patient is able to fill this medication at [facility]. Please contact the patient to complete the onboarding or follow up with the prescribing physician as needed.

## 2020-06-10 NOTE — Unmapped (Signed)
[facility] Clinical Assessment & Refill Coordination Note    Kristy Hanson, DOB: 07-24-1945  Phone: [phone] (home)     All above HIPAA information was verified with patient.     Was a Nurse, learning disability used for this call? No    Specialty Medication(s):   Transplant: tacrolimus 0.5mg      Current Outpatient Medications   Medication Sig Dispense Refill   ??? ALPHAGAN P 0.1 % Drop Administer 1 drop to both eyes Two (2) times a day.     ??? aspirin (ECOTRIN) 81 MG tablet Take 81 mg by mouth daily.     ??? calcium carbonate 300 mg (750 mg) Chew Chew.     ??? cholecalciferol, vitamin D3, (VITAMIN D3) 1,000 unit capsule Take 1,000 Units by mouth daily.     ??? DULoxetine (CYMBALTA) 60 MG capsule Take 60 mg by mouth daily.     ??? ferrous sulfate 325 (65 FE) MG tablet Take 325 mg by mouth daily with breakfast.     ??? folic acid (FOLVITE) 1 MG tablet Take 400 mcg by mouth daily.      ??? ledipasvir 90 mg-sofosbuvir 400 mg (HARVONI) tablet Take 1 tablet by mouth daily. (Patient not taking: Reported on 03/30/2017) 28 tablet 2   ??? potassium citrate (UROCIT-K 15) 15 mEq TbER Take 2 tablets by mouth Two (2) times a day.      ??? propranolol (INNOPRAN XL) 80 MG 24 hr capsule Take 80 mg by mouth nightly.     ??? pyridoxine (B-6) 100 MG tablet Take 100 mg by mouth daily.     ??? rizatriptan (MAXALT-MLT) 5 MG disintegrating tablet Take 5 mg by mouth once as needed for migraine. May repeat in 2 hours if needed     ??? tacrolimus (PROGRAF) 0.5 MG capsule Take 3 capsules (1.5 mg total) by mouth two (2) times a day. 180 capsule 11   ??? traMADol (ULTRAM) 50 mg tablet Take 50 mg by mouth every six (6) hours as needed for pain.       No current facility-administered medications for this visit.        Changes to medications: [name] reports no changes at this time.    Allergies   Allergen Reactions   ??? Avastin [Bevacizumab] Other (See Comments)     Mini stroke   ??? Prednisone Other (See Comments)     Patient was told by her ophthalmologist that the vision loss she had in one of her eyes was likely a result of high dose steroids. She also reports insomnia/jitters when taking it.   ??? Amoxicillin-Pot Clavulanate      Other reaction(s): NAUSEA   ??? Codeine      Other reaction(s): NAUSEA   ??? Penicillins      Other reaction(s): UNKNOWN  * Pt would like to know why she shouldn't take this*   ??? Procaine      Other reaction(s): dizziness   ??? Promethazine      oversedation   ??? Sulfa (Sulfonamide Antibiotics)      Other reaction(s): ITCHING   ??? Lidocaine Palpitations   ??? Tamsulosin Itching       Changes to allergies: No    SPECIALTY MEDICATION ADHERENCE     Tacrolimus 0.5 mg: 10 days of medicine on hand       Medication Adherence    Patient reported X missed doses in the last month: 0  Specialty Medication: Tacrolimus 0.5mg   Patient is on additional  specialty medications: No          Specialty medication(s) dose(s) confirmed: Regimen is correct and unchanged.     Are there any concerns with adherence? No    Adherence counseling provided? Not needed    CLINICAL MANAGEMENT AND INTERVENTION      Clinical Benefit Assessment:    Do you feel the medicine is effective or helping your condition? Yes    Clinical Benefit counseling provided? Not needed    Adverse Effects Assessment:    Are you experiencing any side effects? No    Are you experiencing difficulty administering your medicine? No    Quality of Life Assessment:    How many days over the past month did your liver transplant  keep you from your normal activities? For example, brushing your teeth or getting up in the morning. 0    Have you discussed this with your provider? Not needed    Therapy Appropriateness:    Is therapy appropriate? Pharmacist will consult provider    DISEASE/MEDICATION-SPECIFIC INFORMATION      N/A    PATIENT SPECIFIC NEEDS     - Does the patient have any physical, cognitive, or cultural barriers? No    - Is the patient high risk? No    - Does the patient require a Care Management Plan? No     - Does the patient require physician intervention or other additional services (i.e. nutrition, smoking cessation, social work)? No      SHIPPING     Specialty Medication(s) to be Shipped:   Transplant: tacrolimus 0.5mg     Other medication(s) to be shipped: No additional medications requested for fill at this time     Changes to insurance: No    Delivery Scheduled: Yes, Expected medication delivery date: 06/13/20.     Medication will be delivered via UPS to the confirmed prescription address in [facility].    The patient will receive a drug information handout for each medication shipped and additional FDA Medication Guides as required.  Verified that patient has previously received a Conservation officer, historic buildings.    All of the patient's questions and concerns have been addressed.    Tera Helper   [facility] Specialty Pharmacist

## 2020-06-11 LAB — CBC W/ DIFFERENTIAL
BASOPHILS ABSOLUTE COUNT: 0.1 10*9/L
EOSINOPHILS ABSOLUTE COUNT: 0.2 10*9/L
HEMATOCRIT: 40.2 %
HEMOGLOBIN: 13.9 g/dL
LYMPHOCYTES ABSOLUTE COUNT: 2.1 10*9/L
MONOCYTES ABSOLUTE COUNT: 0.6 10*9/L
NEUTROPHILS ABSOLUTE COUNT: 6.3 10*9/L
PLATELET COUNT: 191 10*9/L
WHITE BLOOD CELL COUNT: 9.3 10*9/L

## 2020-06-11 LAB — COMPREHENSIVE METABOLIC PANEL
ALKALINE PHOSPHATASE: 58 U/L
ALT (SGPT): 17 U/L
AST (SGOT): 17 U/L
BILIRUBIN TOTAL: 0.5 mg/dL
BLOOD UREA NITROGEN: 16 mg/dL
CALCIUM: 9.8 mg/dL
CHLORIDE: 102 mmol/L
CO2: 29 mmol/L
CREATININE: 1.03 mg/dL
EGFR CKD-EPI AA FEMALE: 63 mL/min/{1.73_m2}
EGFR CKD-EPI NON-AA FEMALE: 54 mL/min/{1.73_m2}
GLUCOSE RANDOM: 92 mg/dL
POTASSIUM: 3.3 mmol/L
PROTEIN TOTAL: 6.9 g/dL
SODIUM: 141 mmol/L

## 2020-06-11 LAB — PHOSPHORUS: PHOSPHORUS: 4.6 mg/dL — AB

## 2020-06-11 LAB — MAGNESIUM: MAGNESIUM: 1.9 mg/dL

## 2020-06-11 LAB — TACROLIMUS LEVEL, TROUGH: TACROLIMUS, TROUGH: 7.1 ng/mL

## 2020-06-11 LAB — GAMMA GT: GAMMA GLUTAMYL TRANSFERASE: 19 U/L

## 2020-06-11 LAB — BILIRUBIN, DIRECT: BILIRUBIN DIRECT: 0.1 mg/dL

## 2020-06-11 LAB — ALBUMIN: ALBUMIN: 4.6 g/dL

## 2020-06-12 MED FILL — TACROLIMUS 0.5 MG CAPSULE, IMMEDIATE-RELEASE: 30 days supply | Qty: 180 | Fill #1 | Status: AC

## 2020-06-12 MED FILL — TACROLIMUS 0.5 MG CAPSULE, IMMEDIATE-RELEASE: ORAL | 30 days supply | Qty: 180 | Fill #1

## 2020-06-18 NOTE — Unmapped (Signed)
1/4: pt states she will get her tacrolimus from local pharmacy going forward-ef    [facility] Clinical Assessment & Refill Coordination Note    Kristy Hanson, DOB: June 11, 1946  Phone: [phone] (home)     All above HIPAA information was verified with patient.     Was a Nurse, learning disability used for this call? No    Specialty Medication(s):   Transplant: tacrolimus 0.5mg    Pt now gets prograf 0.5mg  from other pharmacy     Current Outpatient Medications   Medication Sig Dispense Refill   ??? ALPHAGAN P 0.1 % Drop Administer 1 drop to both eyes Two (2) times a day.     ??? aspirin (ECOTRIN) 81 MG tablet Take 81 mg by mouth daily.     ??? calcium carbonate 300 mg (750 mg) Chew Chew.     ??? cholecalciferol, vitamin D3, (VITAMIN D3) 1,000 unit capsule Take 1,000 Units by mouth daily.     ??? DULoxetine (CYMBALTA) 60 MG capsule Take 60 mg by mouth daily.     ??? ferrous sulfate 325 (65 FE) MG tablet Take 325 mg by mouth daily with breakfast.     ??? folic acid (FOLVITE) 1 MG tablet Take 400 mcg by mouth daily.      ??? ledipasvir 90 mg-sofosbuvir 400 mg (HARVONI) tablet Take 1 tablet by mouth daily. (Patient not taking: Reported on 03/30/2017) 28 tablet 2   ??? potassium citrate (UROCIT-K 15) 15 mEq TbER Take 2 tablets by mouth Two (2) times a day.      ??? propranolol (INNOPRAN XL) 80 MG 24 hr capsule Take 80 mg by mouth nightly.     ??? pyridoxine (B-6) 100 MG tablet Take 100 mg by mouth daily.     ??? rizatriptan (MAXALT-MLT) 5 MG disintegrating tablet Take 5 mg by mouth once as needed for migraine. May repeat in 2 hours if needed     ??? tacrolimus (PROGRAF) 0.5 MG capsule Take 3 capsules (1.5 mg total) by mouth two (2) times a day. 180 capsule 11   ??? traMADol (ULTRAM) 50 mg tablet Take 50 mg by mouth every six (6) hours as needed for pain.       No current facility-administered medications for this visit.        Changes to medications: [name] reports no changes at this time.    Allergies   Allergen Reactions   ??? Avastin [Bevacizumab] Other (See Comments)     Mini stroke   ??? Prednisone Other (See Comments)     Patient was told by her ophthalmologist that the vision loss she had in one of her eyes was likely a result of high dose steroids. She also reports insomnia/jitters when taking it.   ??? Amoxicillin-Pot Clavulanate      Other reaction(s): NAUSEA   ??? Codeine      Other reaction(s): NAUSEA   ??? Penicillins      Other reaction(s): UNKNOWN  * Pt would like to know why she shouldn't take this*   ??? Procaine      Other reaction(s): dizziness   ??? Promethazine      oversedation   ??? Sulfa (Sulfonamide Antibiotics)      Other reaction(s): ITCHING   ??? Lidocaine Palpitations   ??? Tamsulosin Itching       Changes to allergies: No    SPECIALTY MEDICATION ADHERENCE     Tacrolimus 0.5mg   : 0 days of medicine on hand , pt states getting  brand prograf from local pharmacy now  Prograf 0.5mg : 25 days on hand    Medication Adherence    Patient reported X missed doses in the last month: 0  Specialty Medication: tacrolimus 0.5mg   Patient is on additional specialty medications: Yes  Additional Specialty Medications: Prograf 0.5mg   Patient Reported Additional Medication X Missed Doses in the Last Month: 0          Specialty medication(s) dose(s) confirmed: Regimen is correct and unchanged.     Are there any concerns with adherence? No    Adherence counseling provided? Not needed    CLINICAL MANAGEMENT AND INTERVENTION      Clinical Benefit Assessment:    Do you feel the medicine is effective or helping your condition? Yes    Clinical Benefit counseling provided? Not needed    Adverse Effects Assessment:    Are you experiencing any side effects? No    Are you experiencing difficulty administering your medicine? No    Quality of Life Assessment:    How many days over the past month did your transplant  keep you from your normal activities? For example, brushing your teeth or getting up in the morning. 0    Have you discussed this with your provider? Not needed    Therapy Appropriateness:    Is therapy appropriate? Yes, therapy is appropriate and should be continued    DISEASE/MEDICATION-SPECIFIC INFORMATION      N/A    PATIENT SPECIFIC NEEDS     - Does the patient have any physical, cognitive, or cultural barriers? No    - Is the patient high risk? No    - Does the patient require a Care Management Plan? No     - Does the patient require physician intervention or other additional services (i.e. nutrition, smoking cessation, social work)? No      SHIPPING     Specialty Medication(s) to be Shipped:   na    Other medication(s) to be shipped: No additional medications requested for fill at this time     Changes to insurance: No    Delivery Scheduled: Patient declined refill at this time due to getting all meds locally.     Medication will be delivered via na to the confirmed na address in [facility].    The patient will receive a drug information handout for each medication shipped and additional FDA Medication Guides as required.  Verified that patient has previously received a Conservation officer, historic buildings.    All of the patient's questions and concerns have been addressed.    Thad Ranger   [facility] Specialty Pharmacist

## 2020-06-18 NOTE — Unmapped (Signed)
This patient has been disenrolled from the [facility] specialty pharmacy services due to a pharmacy change. The patient is now filling at local pharmacy.    Thad Ranger  [facility] Specialty Pharmacist

## 2020-07-10 ENCOUNTER — Other Ambulatory Visit: Payer: Self-pay

## 2020-07-10 ENCOUNTER — Encounter: Payer: Self-pay | Admitting: Nurse Practitioner

## 2020-07-10 ENCOUNTER — Ambulatory Visit (INDEPENDENT_AMBULATORY_CARE_PROVIDER_SITE_OTHER): Payer: Medicare Other | Admitting: Nurse Practitioner

## 2020-07-10 ENCOUNTER — Telehealth: Payer: Self-pay

## 2020-07-10 VITALS — BP 142/70 | HR 83 | Temp 96.6°F | Ht 63.0 in | Wt 147.8 lb

## 2020-07-10 DIAGNOSIS — Z23 Encounter for immunization: Secondary | ICD-10-CM | POA: Diagnosis not present

## 2020-07-10 DIAGNOSIS — Z944 Liver transplant status: Secondary | ICD-10-CM

## 2020-07-10 DIAGNOSIS — I10 Essential (primary) hypertension: Secondary | ICD-10-CM

## 2020-07-10 DIAGNOSIS — E782 Mixed hyperlipidemia: Secondary | ICD-10-CM

## 2020-07-10 DIAGNOSIS — E876 Hypokalemia: Secondary | ICD-10-CM | POA: Diagnosis not present

## 2020-07-10 DIAGNOSIS — L299 Pruritus, unspecified: Secondary | ICD-10-CM

## 2020-07-10 DIAGNOSIS — F419 Anxiety disorder, unspecified: Secondary | ICD-10-CM | POA: Diagnosis not present

## 2020-07-10 MED ORDER — HYDROXYZINE HCL 25 MG PO TABS: 25.0000 mg | ORAL_TABLET | Freq: Four times a day (QID) | ORAL | 0 refills | Status: DC | PRN

## 2020-07-10 NOTE — Telephone Encounter (Signed)
Patient was in office for a visit and as we were thoroughly reviewing her medication list patient states she was told by the pharmacist that we denied her prescription for duloxetine 60mg  and she has no refills on file. Patient stated  it is our responsibility to make sure she has her medications.  I called the pharmacy while Lauree Chandler, NP was in the room with patient and the pharmacy states patient has 4 refill of duloxetine 60 mg and she was told duloxetine 30 mg was denied as she is no longer taking that dose. The pharmacist stated the patient appears to get confused often and we may need to emphasize while we have her here that she is only on the one dose.  I shared this information with Janett Billow via a secure chart to update patient. Janett Billow replied back that patient was stating she did not tell me that we denied rx.  After Janett Billow was finish seeing patient she advised me that patient needs the pneu23 and flu vaccine.  I spoke with patient about flu vaccine informing her that we do not have the high dose in stock, yet she can get it at the local pharmacy. Patient stated she will get the regular dose to get it over with otherwise she may forget to get it at the pharmacy

## 2020-07-10 NOTE — Patient Instructions (Addendum)
Duloxetine is 60 mg- to take 1 tablet daily    increase physical activity  Walk or exercise 30 mins/ 5 days a week.

## 2020-07-10 NOTE — Progress Notes (Signed)
Careteam: Patient Care Team: Lauree Chandler, NP as PCP - General (Geriatric Medicine) Rolm Bookbinder, MD as Consulting Physician (Dermatology) Gerarda Fraction, MD as Referring Physician (Ophthalmology) Raynelle Bring, MD as Consulting Physician (Urology)  PLACE OF SERVICE:  [place]  Advanced Directive information    Allergies  Allergen Reactions  . Codeine Nausea Only  . 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    Marland Kitchen Penicillins Nausea Only and Other (See Comments)    Has patient had a PCN reaction causing immediate rash, facial/tongue/throat swelling, SOB or lightheadedness with hypotension: No Has patient had a PCN reaction causing severe rash involving mucus membranes or skin necrosis: No Has patient had a PCN reaction that required hospitalization No Has patient had a PCN reaction occurring within the last 10 years: No If all of the above answers are "NO", then may proceed with Cephalosporin use.  Marland Kitchen Phenergan [Promethazine] Other (See Comments)    Reaction:  Hallucinations  Pt states that she is only allergic to IV form.   . Sulfa Antibiotics Itching  . Tamsulosin Itching  . Lidocaine Palpitations    Chief Complaint  Patient presents with  . Medical Management of Chronic Issues    5 month follow-up and discuss labs. Discuss need for TD, covid booster (per patient she had a bad reaction to 2nd dose), PNA, and flu vaccine. Refill hydrOXYzine. Discuss who is responsible for medications.      HPI: Patient is a 75 y.o. female for routine follow up.   Wants to see a liver specialist in [place] went to a GI and reports he would not see her to follow up on her liver transplant. Feels like [facility] is the [place] [facility] the [facility] and she will not go.  Reports her blood work is good and does not want to go to [place].   Reports she is traumatized because her granddaughter has been diagnosised with bipolar and  schizophrenia. Lack of control over the situation. Reports she is staying away to maintain her stress level.   Not doing any exercise  Continues to unpack from her move.    Review of Systems:  Review of Systems  Constitutional: Negative for chills, fever and weight loss.  HENT: Negative for tinnitus.   Respiratory: Negative for cough, sputum production and shortness of breath.   Cardiovascular: Negative for chest pain, palpitations and leg swelling.  Gastrointestinal: Negative for abdominal pain, constipation, diarrhea and heartburn.  Genitourinary: Negative for dysuria, frequency and urgency.  Musculoskeletal: Negative for back pain, falls, joint pain and myalgias.  Skin: Negative.   Neurological: Negative for dizziness, focal weakness and headaches.  Psychiatric/Behavioral: Negative for depression and memory loss. The patient is nervous/anxious. The patient does not have insomnia.     Past Medical History:  Diagnosis Date  . Allergic rhinitis 11/14/2012  . Amblyopia of eye, left 06/17/2017  . Asbestos exposure 04/08/2018  . Asthma    [phone] due to black mold house, no asthma now  . Broken ankle    right  . Broken rib    x2  . Central retinal vein occlusion    left eye retinal occlusion  . Chronic hepatitis C virus infection 05/31/2013  . Complication of anesthesia    IV phenergan  serious mental reaction  . Depression   . Essential hypertension 10/14/2017  . Gastroesophageal reflux disease 10/14/2017  . Hernia, femoral    left  .  History of blood transfusion 1971   acquired hepatitis c  . History of kidney stones   . History of liver transplant 11/14/2012  . History of nephrolithiasis 10/14/2017  . History of renal stone   . History of shingles   . Hyperglycemia 04/08/2018  . Malignant neoplasm of liver 11/14/2012  . Migraine 07/02/2015  . 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 Seasonal affective disorder   . Tailbone injury age 66 and age 49 and few weeks ago    broken x3  . TIA (transient ischemic attack)    x3  . 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 Vitamin D deficiency 04/08/2018   Past Surgical History:  Procedure Laterality Date  . CATARACT EXTRACTION Bilateral   . CHOLECYSTECTOMY  1997  . CYSTOSCOPY W/ URETERAL STENT PLACEMENT Left 08/14/2016   Procedure: CYSTOSCOPY WITH RETROGRADE PYELOGRAM/URETERAL LEFT URETEROSCOPY AND LEFT STENT PLACEMENT;  Surgeon: Raynelle Bring, MD;  Location: [facility];  Service: Urology;  Laterality: Left;  . CYSTOSCOPY/URETEROSCOPY/HOLMIUM LASER/STENT PLACEMENT Left 07/06/2016   Procedure: CYSTOSCOPY/URETEROSCOPY/ BILATERAL RETROGRADE/HOLMIUM LASER/STENT PLACEMENT/BASKET STONE REMOVAL;  Surgeon: Raynelle Bring, MD;  Location: [facility];  Service: Urology;  Laterality: Left;  . CYSTOSCOPY/URETEROSCOPY/HOLMIUM LASER/STENT PLACEMENT Left 08/06/2016   Procedure: CYSTOSCOPY/URETEROSCOPY/HOLMIUM LASER/STENT PLACEMENT;  Surgeon: Raynelle Bring, MD;  Location: [facility];  Service: Urology;  Laterality: Left;  . CYSTOSCOPY/URETEROSCOPY/HOLMIUM LASER/STENT PLACEMENT Left 08/31/2016   Procedure: CYSTOSCOPY/URETEROSCOPY/HOLMIUM LASER/STENT PLACEMENT/BASKET STONE REMOVAL;  Surgeon: Raynelle Bring, MD;  Location: [facility];  Service: Urology;  Laterality: Left;  . FEMORAL HERNIA REPAIR Left 1997   left leg-  . LIVER TRANSPLANTATION    . NASAL SINUS SURGERY  1997   x3  . TONSILLECTOMY  age 60  . TUBAL LIGATION     Social History:   reports that she has never smoked. She has never used smokeless tobacco. She reports that she does not drink alcohol and does not use drugs.  Family History  Adopted: Yes  Problem Relation Age of Onset  . Stroke Mother   . Alzheimer's disease Mother   . Cancer Mother   . Diabetes Mother        Questionable, per new patient packet   . Arthritis Mother        Questionable, per new patient packet   . Alcoholism Father   . COPD Brother         Heavy smoker, per new patient packet   . Suicidality Sister   . Depression Sister   . COPD Sister   . OCD Daughter   . Personality disorder Daughter   . Alcoholism Son   . Bipolar disorder Granddaughter        Age 48 as of 07/10/2020  . Schizophrenia Granddaughter     Medications: Patient's Medications  New Prescriptions   No medications on file  Previous Medications   ASPIRIN EC 81 MG TABLET    Take 81 mg by mouth daily.   BIOTIN 800 MCG TABS    Take 1 tablet by mouth.   CALCIUM-VITAMIN D PO    Take 600 mg by mouth daily.   DULOXETINE (CYMBALTA) 60 MG CAPSULE    Take 1 capsule (60 mg total) by mouth daily.   FERROUS SULFATE (IRON) 325 (65 FE) MG TABS    Take 1 tablet by mouth daily.   FOLIC ACID (FOLVITE) 884 MCG TABLET    Take 400 mcg by mouth daily.   HYDROXYZINE (ATARAX/VISTARIL)  25 MG TABLET    Take 25 mg by mouth every 6 (six) hours as needed for itching.   TACROLIMUS (PROGRAF) 0.5 MG CAPSULE    6 by mouth daily NAME BRAND ONLY  Modified Medications   No medications on file  Discontinued Medications   No medications on file    Physical Exam:  Vitals:   07/10/20 1510  BP: (!) 142/70  Pulse: 83  Temp: (!) 96.6 F (35.9 C)  TempSrc: Temporal  SpO2: 97%  Weight: 147 lb 12.8 oz (67 kg)  Height: 5\' 3"  (1.6 m)   Body mass index is 26.18 kg/m. Wt Readings from Last 3 Encounters:  07/10/20 147 lb 12.8 oz (67 kg)  05/23/20 152 lb 9.6 oz (69.2 kg)  04/16/20 150 lb 3.2 oz (68.1 kg)    Physical Exam Constitutional:      General: She is not in acute distress.    Appearance: She is well-developed and well-nourished. She is not diaphoretic.  HENT:     Head: Normocephalic and atraumatic.     Mouth/Throat:     Mouth: Oropharynx is clear and moist.     Pharynx: No oropharyngeal exudate.  Eyes:     Conjunctiva/sclera: Conjunctivae normal.     Pupils: Pupils are equal, round, and reactive to light.  Cardiovascular:     Rate and Rhythm: Normal rate and regular rhythm.      Heart sounds: Normal heart sounds.  Pulmonary:     Effort: Pulmonary effort is normal.     Breath sounds: Normal breath sounds.  Abdominal:     General: Bowel sounds are normal.     Palpations: Abdomen is soft.  Musculoskeletal:        General: No tenderness or edema.     Cervical back: Normal range of motion and neck supple.  Skin:    General: Skin is warm and dry.  Neurological:     Mental Status: She is alert and oriented to person, place, and time.  Psychiatric:        Mood and Affect: Mood and affect normal.     Labs reviewed: Basic Metabolic Panel: Recent Labs    09/06/19 1117 06/04/20 0814  NA 142 141  K 3.9 3.3*  CL 103 102  CO2 30 29  GLUCOSE 104* 92  BUN 13 16  CREATININE 1.08* 1.03*  CALCIUM 9.4 9.8  MG  --  1.9  PHOS  --  4.6*  TSH 2.98  --    Liver Function Tests: Recent Labs    09/06/19 1117 06/04/20 0814  AST 19 17  ALT 16 17  BILITOT 0.6 0.5  PROT 7.0 6.9   No results for input(s): LIPASE, AMYLASE in the last 8760 hours. No results for input(s): AMMONIA in the last 8760 hours. CBC: Recent Labs    09/06/19 1117 06/04/20 0814  WBC 7.3 9.3  NEUTROABS 4,840 6,343  HGB 14.9 13.9  HCT 44.0 40.2  MCV 91.1 90.3  PLT 191 191   Lipid Panel: Recent Labs    09/06/19 1117 02/06/20 0937  CHOL 208* 213*  HDL 56 55  LDLCALC 134* 139*  TRIG 83 89  CHOLHDL 3.7 3.9   TSH: Recent Labs    09/06/19 1117  TSH 2.98   A1C: Lab Results  Component Value Date   HGBA1C 5.3 12/23/2018     Assessment/Plan 1. Anxiety -ongoing, continues to follow up with counselor.  -continues on cymbalta 60 mg daily - hydrOXYzine (ATARAX/VISTARIL) 25 MG tablet;  Take 1 tablet (25 mg total) by mouth every 6 (six) hours as needed for itching.  Dispense: 30 tablet; Refill: 0  2. Pruritus -educated to use hydroxyzine sparingly that is is on the [facility] list and should be use with caution in the geriatric population.  - hydrOXYzine (ATARAX/VISTARIL) 25 MG  tablet; Take 1 tablet (25 mg total) by mouth every 6 (six) hours as needed for itching.  Dispense: 30 tablet; Refill: 0  3. Hypokalemia -noted on last labs, will follow up.  - BASIC METABOLIC PANEL WITH GFR  4. Liver replaced by transplant ([place]) Continues on prograf- being managed by transplant hepatology in [place].   5. Mixed hyperlipidemia Not on statin due to liver transplant. Recommend dietary modifications.   6. Essential hypertension Blood pressure stable, continue dietary modifications.   7. Need for influenza vaccination - Flu Vaccine QUAD 6+ mos PF IM (Fluarix Quad PF)  8. Need for 23-polyvalent pneumococcal polysaccharide vaccine - Pneumococcal polysaccharide vaccine 23-valent greater than or equal to 2yo subcutaneous/IM  Next appt: 6 months.  Carlos American. [place], [facility] [phone]

## 2020-07-11 LAB — BASIC METABOLIC PANEL WITH GFR
BUN/Creatinine Ratio: 12 (calc) (ref 6–22)
BUN: 13 mg/dL (ref 7–25)
CO2: 25 mmol/L (ref 20–32)
Calcium: 9.6 mg/dL (ref 8.6–10.4)
Chloride: 103 mmol/L (ref 98–110)
Creat: 1.13 mg/dL — ABNORMAL HIGH (ref 0.60–0.93)
GFR, Est African American: 56 mL/min/{1.73_m2} — ABNORMAL LOW (ref >=60)
GFR, Est Non African American: 49 mL/min/{1.73_m2} — ABNORMAL LOW (ref >=60)
Glucose, Bld: 96 mg/dL (ref 65–99)
Potassium: 4 mmol/L (ref 3.5–5.3)
Sodium: 139 mmol/L (ref 135–146)

## 2020-07-24 DIAGNOSIS — F331 Major depressive disorder, recurrent, moderate: Secondary | ICD-10-CM | POA: Diagnosis not present

## 2020-07-29 ENCOUNTER — Telehealth: Payer: Self-pay | Admitting: *Deleted

## 2020-07-29 NOTE — Telephone Encounter (Signed)
Patient called and stated that she has been really upset with a life issue she was made aware about at Christmas with her Granddaughter. Stated that she was diagnosed with Schizophrenia and is 51 and will not stop calling her and saying ugly things.  Patient stated that our Providers are good but not knowledgeable with this transplant stuff she is going through.    Patient stated that she was very depressed last week. Stated that she has 2 Duloxetine (30mg  and 60mg ) stated that she has been taking one or the other and last week she asked her Pharmacist at [facility] if she could take both since she was so Depressed. The Pharmacist told her to only take the 60mg  and call us.  Patient is wondering if she can take both. (30mg  is NOT in current medication list)   Please Advise.

## 2020-07-29 NOTE — Telephone Encounter (Signed)
Patient notified and agreed.  

## 2020-07-29 NOTE — Telephone Encounter (Signed)
No she needs to only take 60 mg daily.  Recommend for her to call her therapist or if she feels like she needs medication changes would recommended her to see a  psychiatrist.

## 2020-08-04 DIAGNOSIS — Z79899 Other long term (current) drug therapy: Principal | ICD-10-CM

## 2020-08-04 DIAGNOSIS — Z944 Liver transplant status: Principal | ICD-10-CM

## 2020-08-04 MED ORDER — PROGRAF 0.5 MG CAPSULE
ORAL_CAPSULE | 1 refills | 0 days
Start: 2020-08-04 — End: ?

## 2020-08-05 MED ORDER — PROGRAF 0.5 MG CAPSULE
ORAL_CAPSULE | 1 refills | 0 days | Status: CP
Start: 2020-08-05 — End: ?

## 2020-08-21 DIAGNOSIS — F331 Major depressive disorder, recurrent, moderate: Secondary | ICD-10-CM | POA: Diagnosis not present

## 2020-08-21 DIAGNOSIS — F411 Generalized anxiety disorder: Secondary | ICD-10-CM | POA: Diagnosis not present

## 2020-09-10 DIAGNOSIS — I728 Aneurysm of other specified arteries: Secondary | ICD-10-CM | POA: Diagnosis not present

## 2020-09-10 DIAGNOSIS — R109 Unspecified abdominal pain: Secondary | ICD-10-CM | POA: Diagnosis not present

## 2020-09-10 DIAGNOSIS — N200 Calculus of kidney: Secondary | ICD-10-CM | POA: Diagnosis not present

## 2020-09-10 DIAGNOSIS — Z8505 Personal history of malignant neoplasm of liver: Secondary | ICD-10-CM | POA: Diagnosis not present

## 2020-09-10 DIAGNOSIS — I7789 Other specified disorders of arteries and arterioles: Secondary | ICD-10-CM | POA: Diagnosis not present

## 2020-09-13 ENCOUNTER — Other Ambulatory Visit: Payer: Self-pay

## 2020-09-13 ENCOUNTER — Encounter: Payer: Self-pay | Admitting: Family

## 2020-09-13 ENCOUNTER — Ambulatory Visit (INDEPENDENT_AMBULATORY_CARE_PROVIDER_SITE_OTHER): Payer: Medicare Other | Admitting: Family

## 2020-09-13 VITALS — BP 138/84 | HR 85 | Temp 97.1°F | Resp 16 | Ht 63.0 in | Wt 145.0 lb

## 2020-09-13 DIAGNOSIS — R399 Unspecified symptoms and signs involving the genitourinary system: Secondary | ICD-10-CM | POA: Diagnosis not present

## 2020-09-13 LAB — POCT URINALYSIS DIPSTICK
Glucose, UA: NEGATIVE
Nitrite, UA: NEGATIVE
Protein, UA: POSITIVE — AB
Spec Grav, UA: 1.02 (ref 1.010–1.025)
Urobilinogen, UA: NEGATIVE E.U./dL — AB
pH, UA: 5 (ref 5.0–8.0)

## 2020-09-13 NOTE — Patient Instructions (Signed)
Increased your water intake to 6-8 glasses daily  - Urine culture send to the lab will call you with results  - Notify provider if symptoms worsen or fail to improve   Urinary Tract Infection, Adult A urinary tract infection (UTI) is an infection of any part of the urinary tract. The urinary tract includes:  The kidneys.  The ureters.  The bladder.  The urethra. These organs make, store, and get rid of pee (urine) in the body. What are the causes? This infection is caused by germs (bacteria) in your genital area. These germs grow and cause swelling (inflammation) of your urinary tract. What increases the risk? The following factors may make you more likely to develop this condition:  Using a small, thin tube (catheter) to drain pee.  Not being able to control when you pee or poop (incontinence).  Being female. If you are female, these things can increase the risk: ? Using these methods to prevent pregnancy:  A medicine that kills sperm (spermicide).  A device that blocks sperm (diaphragm). ? Having low levels of a female hormone (estrogen). ? Being pregnant. You are more likely to develop this condition if:  You have genes that add to your risk.  You are sexually active.  You take antibiotic medicines.  You have trouble peeing because of: ? A prostate that is bigger than normal, if you are female. ? A blockage in the part of your body that drains pee from the bladder. ? A kidney stone. ? A nerve condition that affects your bladder. ? Not getting enough to drink. ? Not peeing often enough.  You have other conditions, such as: ? Diabetes. ? A weak disease-fighting system (immune system). ? Sickle cell disease. ? Gout. ? Injury of the spine. What are the signs or symptoms? Symptoms of this condition include:  Needing to pee right away.  Peeing small amounts often.  Pain or burning when peeing.  Blood in the pee.  Pee that smells bad or not like  normal.  Trouble peeing.  Pee that is cloudy.  Fluid coming from the vagina, if you are female.  Pain in the belly or lower back. Other symptoms include:  Vomiting.  Not feeling hungry.  Feeling mixed up (confused). This may be the first symptom in older adults.  Being tired and grouchy (irritable).  A fever.  Watery poop (diarrhea). How is this treated?  Taking antibiotic medicine.  Taking other medicines.  Drinking enough water. In some cases, you may need to see a specialist. Follow these instructions at home: Medicines  Take over-the-counter and prescription medicines only as told by your doctor.  If you were prescribed an antibiotic medicine, take it as told by your doctor. Do not stop taking it even if you start to feel better. General instructions  Make sure you: ? Pee until your bladder is empty. ? Do not hold pee for a long time. ? Empty your bladder after sex. ? Wipe from front to back after peeing or pooping if you are a female. Use each tissue one time when you wipe.  Drink enough fluid to keep your pee pale yellow.  Keep all follow-up visits.   Contact a doctor if:  You do not get better after 1-2 days.  Your symptoms go away and then come back. Get help right away if:  You have very bad back pain.  You have very bad pain in your lower belly.  You have a fever.  You have  chills.  You feeling like you will vomit or you vomit. Summary  A urinary tract infection (UTI) is an infection of any part of the urinary tract.  This condition is caused by germs in your genital area.  There are many risk factors for a UTI.  Treatment includes antibiotic medicines.  Drink enough fluid to keep your pee pale yellow. This information is not intended to replace advice given to you by your health care provider. Make sure you discuss any questions you have with your health care provider. Document Revised: 01/12/2020 Document Reviewed:  01/12/2020 Elsevier Patient Education  [employer].

## 2020-09-13 NOTE — Progress Notes (Addendum)
Provider: Kamrin Spath FNP-C  Lauree Chandler, NP  Patient Care Team: Lauree Chandler, NP as PCP - General (Geriatric Medicine) Rolm Bookbinder, MD as Consulting Physician (Dermatology) Gerarda Fraction, MD as Referring Physician (Ophthalmology) Raynelle Bring, MD as Consulting Physician (Urology)  Extended Emergency Contact Information Primary Emergency Contact: Randa Spike [facility] Phone: [phone] Work Phone: [phone] Mobile Phone: [phone] Relation: Daughter Secondary Emergency Contact: Thomson,Stacy Address: [place], [place] [address] [place] Phone: [phone] Mobile Phone: [phone] Relation: Friend  Code Status:  Full Code  Goals of care: Advanced Directive information Advanced Directives 09/13/2020  Does Patient Have a Medical Advance Directive? No  Type of Advance Directive -  Does patient want to make changes to medical advance directive? No - Patient declined  Copy of [place] in Chart? -  Would patient like information on creating a medical advance directive? -  Pre-existing out of facility DNR order (yellow form or pink MOST form) -     Chief Complaint  Patient presents with  . Acute Visit    Abdominal Pain    HPI:  Pt is a 75 y.o. female seen today for an acute visit for evaluation of lower abdominal pain on and off.states pain not severe but sometimes gets worst during the day.Pain worsen on Monday afternoon this week.she had a visit with Urologist on 09/11/2020 had urine specimen collected but was told nothing showed on results.Had CT of ADB and Pelvis also done no available report for evaluation.Advised to request records. She denies any fever,chills,nausea,vomting or diarrhea or back pain.   Addendum: 09/17/2020  Records from [facility] received and reviewed indicates patient seen 09/10/2020 for one year routine follow up.Presented with lower abdominal pain with  radiation to right groin.urine analysis did not indicate any infection.renal ultrasound was done which did not show any obstructive uropathy.  Due to severe pain CT scan was also ordered by Urologist 09/10/2020 results indicated under distension of the stomach,suggestion of mild perigastric stranding and gastric thickening,very subtle.Post Orthotopic liver transplant also noted. 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    Past Medical History:  Diagnosis Date  . Allergic rhinitis 11/14/2012  . Amblyopia of eye, left 06/17/2017  . Asbestos exposure 04/08/2018  . Asthma    [phone] due to black mold house, no asthma now  . Broken ankle    right  . Broken rib    x2  . Central retinal vein occlusion    left eye retinal occlusion  . Chronic hepatitis C virus infection 05/31/2013  . Complication of anesthesia    IV phenergan  serious mental reaction  . Depression   . Essential hypertension 10/14/2017  . Gastroesophageal reflux disease 10/14/2017  . Hernia, femoral    left  . History of blood transfusion 1971   acquired hepatitis c  . History of kidney stones   . History of liver transplant 11/14/2012  . History of nephrolithiasis 10/14/2017  . History of renal stone   .  History of shingles   . Hyperglycemia 04/08/2018  . Malignant neoplasm of liver 11/14/2012  . Migraine 07/02/2015  . 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 Seasonal affective disorder   . Tailbone injury age 89 and age  25 and few weeks ago   broken x3  . TIA (transient ischemic attack)    x3  . 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 Vitamin D deficiency 04/08/2018   Past Surgical History:  Procedure Laterality Date  . CATARACT EXTRACTION Bilateral   . CHOLECYSTECTOMY  1997  . CYSTOSCOPY W/ URETERAL STENT PLACEMENT Left 08/14/2016   Procedure: CYSTOSCOPY WITH RETROGRADE PYELOGRAM/URETERAL LEFT URETEROSCOPY AND LEFT STENT PLACEMENT;  Surgeon: Raynelle Bring, MD;  Location: [facility];  Service: Urology;  Laterality: Left;  . CYSTOSCOPY/URETEROSCOPY/HOLMIUM LASER/STENT PLACEMENT Left 07/06/2016   Procedure: CYSTOSCOPY/URETEROSCOPY/ BILATERAL RETROGRADE/HOLMIUM LASER/STENT PLACEMENT/BASKET STONE REMOVAL;  Surgeon: Raynelle Bring, MD;  Location: [facility];  Service: Urology;  Laterality: Left;  . CYSTOSCOPY/URETEROSCOPY/HOLMIUM LASER/STENT PLACEMENT Left 08/06/2016   Procedure: CYSTOSCOPY/URETEROSCOPY/HOLMIUM LASER/STENT PLACEMENT;  Surgeon: Raynelle Bring, MD;  Location: [facility];  Service: Urology;  Laterality: Left;  . CYSTOSCOPY/URETEROSCOPY/HOLMIUM LASER/STENT PLACEMENT Left 08/31/2016   Procedure: CYSTOSCOPY/URETEROSCOPY/HOLMIUM LASER/STENT PLACEMENT/BASKET STONE REMOVAL;  Surgeon: Raynelle Bring, MD;  Location: [facility];  Service: Urology;  Laterality: Left;  . FEMORAL HERNIA REPAIR Left 1997   left leg-  . LIVER TRANSPLANTATION    . NASAL SINUS SURGERY  1997   x3  . TONSILLECTOMY  age 76  . TUBAL LIGATION      Allergies  Allergen Reactions  . Codeine Nausea Only  . 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    Marland Kitchen Penicillins Nausea Only and Other (See Comments)    Has patient had a PCN reaction causing immediate rash, facial/tongue/throat swelling, SOB or lightheadedness with hypotension: No Has patient had a PCN reaction causing severe rash involving mucus membranes or skin necrosis: No Has patient had a PCN  reaction that required hospitalization No Has patient had a PCN reaction occurring within the last 10 years: No If all of the above answers are "NO", then may proceed with Cephalosporin use.  Marland Kitchen Phenergan [Promethazine] Other (See Comments)    Reaction:  Hallucinations  Pt states that she is only allergic to IV form.   . Sulfa Antibiotics Itching  . Tamsulosin Itching  . Lidocaine Palpitations    Outpatient Encounter Medications as of 09/13/2020  Medication Sig  . aspirin EC 81 MG tablet Take 81 mg by mouth daily.  . Biotin 800 MCG TABS Take 1 tablet by mouth.  Marland Kitchen CALCIUM-VITAMIN D PO Take 600 mg by mouth daily.  . DULoxetine (CYMBALTA) 60 MG capsule Take 1 capsule (60 mg total) by mouth daily.  . Ferrous Sulfate (IRON) 325 (65 Fe) MG TABS Take 1 tablet by mouth daily.  . folic acid (FOLVITE) 185 MCG tablet Take 400 mcg by mouth daily.  . hydrOXYzine (ATARAX/VISTARIL) 25 MG tablet Take 1 tablet (25 mg total) by mouth every 6 (six) hours as needed for itching.  . tacrolimus (PROGRAF) 0.5 MG capsule 6 by mouth daily NAME BRAND ONLY   No facility-administered encounter medications on file as of 09/13/2020.    Review of Systems  Constitutional: Negative for appetite change, chills, fatigue and fever.  Respiratory: Negative for chest tightness, shortness of breath and wheezing.   Cardiovascular: Negative for chest pain, palpitations and  leg swelling.  Gastrointestinal: Positive for constipation. Negative for abdominal distention, abdominal pain, diarrhea, nausea and vomiting.    Immunization History  Administered Date(s) Administered  . Fluad Quad(high Dose 65+) 02/15/2019  . Influenza, Seasonal, Injecte, Preservative Fre 03/18/2009  . Influenza,inj,Quad PF,6+ Mos 04/08/2018, 07/10/2020  . Influenza-Unspecified 03/16/2017  . Moderna Sars-Covid-2 Vaccination 06/14/2019, 07/12/2019  . Pneumococcal Conjugate-13 07/12/2018  . Pneumococcal Polysaccharide-23 07/10/2020   Pertinent  Health  Maintenance Due  Topic Date Due  . INFLUENZA VACCINE  01/13/2021  . MAMMOGRAM  04/11/2022  . COLONOSCOPY (Pts 45-5yrs Insurance coverage will need to be confirmed)  01/26/2023  . DEXA SCAN  Completed  . PNA vac Low Risk Adult  Completed   Fall Risk  09/13/2020 07/10/2020 04/16/2020 02/22/2020 01/02/2020  Falls in the past year? 0 0 0 1 1  Number falls in past yr: 0 0 0 1 0  Injury with Fall? 0 0 0 - 0  Comment - - - - -  Risk for fall due to : - - - - -  Risk for fall due to: Comment - - - - -  Follow up - - - - -   Functional Status Survey:    Vitals:   09/13/20 1132  BP: 138/84  Pulse: 85  Resp: 16  Temp: (!) 97.1 F (36.2 C)  SpO2: 98%  Weight: 145 lb (65.8 kg)  Height: 5\' 3"  (1.6 m)   Body mass index is 25.69 kg/m. Physical Exam Vitals reviewed.  Constitutional:      General: She is not in acute distress.    Appearance: She is overweight. She is not ill-appearing.  HENT:     Head: Normocephalic.     Mouth/Throat:     Mouth: Mucous membranes are moist.     Pharynx: Oropharynx is clear. No oropharyngeal exudate or posterior oropharyngeal erythema.  Eyes:     General: No scleral icterus.       Right eye: No discharge.        Left eye: No discharge.     Extraocular Movements: Extraocular movements intact.     Conjunctiva/sclera: Conjunctivae normal.     Pupils: Pupils are equal, round, and reactive to light.  Cardiovascular:     Rate and Rhythm: Normal rate and regular rhythm.     Pulses: Normal pulses.     Heart sounds: Normal heart sounds. No murmur heard. No friction rub. No gallop.   Pulmonary:     Effort: Pulmonary effort is normal. No respiratory distress.     Breath sounds: Normal breath sounds. No wheezing, rhonchi or rales.  Chest:     Chest wall: No tenderness.  Abdominal:     General: Bowel sounds are normal. There is no distension.     Palpations: Abdomen is soft.     Tenderness: There is abdominal tenderness in the suprapubic area. There is no  right CVA tenderness, left CVA tenderness, guarding or rebound. Negative signs include Murphy's sign.  Skin:    General: Skin is warm and dry.     Coloration: Skin is not pale.     Findings: No bruising, erythema or rash.  Neurological:     Mental Status: She is alert and oriented to person, place, and time.     Cranial Nerves: No cranial nerve deficit.     Motor: No weakness.     Gait: Gait normal.  Psychiatric:        Mood and Affect: Mood normal.  Speech: Speech normal.        Behavior: Behavior normal.        Thought Content: Thought content normal.     Labs reviewed: Recent Labs    06/04/20 0814 07/10/20 1616  NA 141 139  K 3.3* 4.0  CL 102 103  CO2 29 25  GLUCOSE 92 96  BUN 16 13  CREATININE 1.03* 1.13*  CALCIUM 9.8 9.6  MG 1.9  --   PHOS 4.6*  --    Recent Labs    06/04/20 0814  AST 17  ALT 17  BILITOT 0.5  PROT 6.9   Recent Labs    06/04/20 0814  WBC 9.3  NEUTROABS 6,343  HGB 13.9  HCT 40.2  MCV 90.3  PLT 191   Lab Results  Component Value Date   TSH 2.98 09/06/2019   Lab Results  Component Value Date   HGBA1C 5.3 12/23/2018   Lab Results  Component Value Date   CHOL 213 (H) 02/06/2020   HDL 55 02/06/2020   LDLCALC 139 (H) 02/06/2020   TRIG 89 02/06/2020   CHOLHDL 3.9 02/06/2020    Significant Diagnostic Results in last 30 days:  No results found.  Assessment/Plan  Symptoms of urinary tract infection Afebrile.suprapubic tenderness.  - POC Urinalysis Dipstick indicates dark cloudy urine with large leukocytes and blood.will send urine for culture. Made aware culture will take 3 days then will call with results. - encouraged to increase water intake - Urine Culture  Family/ staff Communication: Reviewed plan of care with patient verbalized understanding   Labs/tests ordered: - Urine Culture  Next Appointment: As needed if symptoms worsen or fail to improve  Sandrea Hughs, NP

## 2020-09-15 LAB — URINE CULTURE
MICRO NUMBER:: 11725167
Result:: NO GROWTH
SPECIMEN QUALITY:: ADEQUATE

## 2020-09-16 ENCOUNTER — Encounter: Payer: Self-pay | Admitting: Gastroenterology

## 2020-09-16 ENCOUNTER — Telehealth: Payer: Self-pay

## 2020-09-16 NOTE — Telephone Encounter (Signed)
Patient called stating that she was seen on Friday by Marlowe Sax, NP. She states aht she has not gotten any better. She has been in pain for about 2 weeks. She has green bowel movements and Pain, She now has watery stools. She has been taking Miralax. I offered patient an in office appointment today with Marlowe Sax, NP at 1:30 pm or a virtual visit. She refused both, stating that I should look in her records and find a physician that came to see her in office one year ago and stated that she could call her at anytime. Patient also did not want to go to the emergency room. PLEASE ADVISE ASAP as patient is awaiting a call back.  Message routed to Sherrie Mustache, NP

## 2020-09-16 NOTE — Telephone Encounter (Signed)
Records received from urologist.

## 2020-09-16 NOTE — Telephone Encounter (Signed)
Discussed Candace Hill's responses with patient, patient verbalized understanding. I gave patient number to Dr.Danis. [name] and the administrative team need to confirm if records received via Onbase and if not call to request records

## 2020-09-16 NOTE — Telephone Encounter (Signed)
I would recommend stopping the miralax. We will need to get her records from urologist (have not received yet). She will need a Gastroenterology follow up with Dr Loletha Carrow at [place] where she has gone before.

## 2020-09-17 ENCOUNTER — Telehealth: Payer: Self-pay | Admitting: Family

## 2020-09-17 NOTE — Telephone Encounter (Signed)
I agree with referral to Gastroenterologist too.If symptoms worsen need to be evaluated in ED.Urine culture was negative for urinary tract infection.Urologist and CT scan records put in box to be scanned.

## 2020-09-17 NOTE — Telephone Encounter (Signed)
Did not know if you wanted to add anything since you saw pt on Friday

## 2020-09-17 NOTE — Telephone Encounter (Signed)
Has CT scan results been discussed with you by [facility]? Aortic Atherosclerosis and splenic Artery aneurysm was  Noted.Splenic Artery Aneursym measured 1.3 cm previous was 1.2 cm . if CT scan has been addressed by Urologist then follow recommendation but if not  I recommend referral to vascular specialist for further evaluation  of splenic artery aneurysm. Also start on Atorvastatin 10 mg tablet one by mouth daily

## 2020-09-17 NOTE — Telephone Encounter (Signed)
Atorvastatin will help to prevent cardiovascular events like stroke or heart attack due to plague build-up on the arteries.

## 2020-09-18 ENCOUNTER — Other Ambulatory Visit: Payer: Self-pay

## 2020-09-18 ENCOUNTER — Encounter: Payer: Self-pay | Admitting: Family

## 2020-09-18 ENCOUNTER — Ambulatory Visit (INDEPENDENT_AMBULATORY_CARE_PROVIDER_SITE_OTHER): Payer: Medicare Other | Admitting: Family

## 2020-09-18 ENCOUNTER — Telehealth: Payer: Self-pay

## 2020-09-18 DIAGNOSIS — I700 Atherosclerosis of aorta: Secondary | ICD-10-CM

## 2020-09-18 DIAGNOSIS — I728 Aneurysm of other specified arteries: Secondary | ICD-10-CM

## 2020-09-18 DIAGNOSIS — F411 Generalized anxiety disorder: Secondary | ICD-10-CM | POA: Diagnosis not present

## 2020-09-18 DIAGNOSIS — R197 Diarrhea, unspecified: Secondary | ICD-10-CM

## 2020-09-18 DIAGNOSIS — F331 Major depressive disorder, recurrent, moderate: Secondary | ICD-10-CM | POA: Diagnosis not present

## 2020-09-18 MED ORDER — ATORVASTATIN CALCIUM 10 MG PO TABS: 10.0000 mg | ORAL_TABLET | Freq: Every day | ORAL | 0 refills | Status: DC

## 2020-09-18 NOTE — Telephone Encounter (Signed)
Patient called very upset about her tel visit appointment that was supposed to be this morning at 10:30 am and it is 2:38 pm and  has not been done yet.

## 2020-09-18 NOTE — Telephone Encounter (Signed)
Patient will be called by Marlowe Sax, NP.

## 2020-09-18 NOTE — Progress Notes (Signed)
This service is provided via telemedicine  No vital signs collected/recorded due to the encounter was a telemedicine visit.   Location of patient (ex: home, work): Home.  Patient consents to a telephone visit: Yes  Location of the provider (ex: office, home): [facility].  Name of any referring provider: Lauree Chandler, NP   Names of all persons participating in the telemedicine service and their role in the encounter: Patient, Heriberto Antigua, [place], [place], Webb Silversmith, NP.    Time spent on call: 8 minutes spent on the phone with Medical Assistant.    Provider: Adelene Polivka FNP-C  Lauree Chandler, NP  Patient Care Team: Lauree Chandler, NP as PCP - General (Geriatric Medicine) Rolm Bookbinder, MD as Consulting Physician (Dermatology) Gerarda Fraction, MD as Referring Physician (Ophthalmology) Raynelle Bring, MD as Consulting Physician (Urology) Loletha Carrow, Kirke Corin, MD as Consulting Physician (Gastroenterology)  Extended Emergency Contact Information Primary Emergency Contact: Randa Spike [facility] Phone: [phone] Work Phone: [phone] Mobile Phone: [phone] Relation: Daughter Secondary Emergency Contact: Thomson,Stacy Address: [place], [place] [address] [address] Phone: [phone] Mobile Phone: [phone] Relation: Friend  Code Status: Full Code  Goals of care: Advanced Directive information Advanced Directives 09/18/2020  Does Patient Have a Medical Advance Directive? No  Type of Advance Directive -  Does patient want to make changes to medical advance directive? No - Patient declined  Copy of [place] in Chart? -  Would patient like information on creating a medical advance directive? -  Pre-existing out of facility DNR order (yellow form or pink MOST form) -     Chief Complaint  Patient presents with  . Acute Visit    Discuss results from urologist.     HPI:   Pt is a 75 y.o. female seen today for an acute visit to discuss recent CT scan results ordered recently by UrologistDue to severe pain CT scan was also ordered by Urologist 09/10/2020 results indicated under distension of the stomach,suggestion of mild perigastric stranding and gastric thickening,very subtle.Post Orthotopic liver transplant also noted. 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 Appetite is good.Has upcoming appointment for colonoscopy but would like referral to GI.    Past Medical History:  Diagnosis Date  . Allergic rhinitis 11/14/2012  . Amblyopia of eye, left 06/17/2017  . Asbestos exposure 04/08/2018  . Asthma    [phone] due to black mold house, no asthma now  . Broken ankle    right  . Broken rib    x2  . Central retinal vein occlusion    left eye retinal occlusion  . Chronic hepatitis C virus infection 05/31/2013  . Complication of anesthesia    IV phenergan  serious mental reaction  . Depression   . Essential hypertension 10/14/2017  . Gastroesophageal reflux disease 10/14/2017  . Hernia, femoral  left  . History of blood transfusion 1971   acquired hepatitis c  . History of kidney stones   . History of liver transplant 11/14/2012  . History of nephrolithiasis 10/14/2017  . History of renal stone   . History of shingles   .  Hyperglycemia 04/08/2018  . Malignant neoplasm of liver 11/14/2012  . Migraine 07/02/2015  . 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 Seasonal affective disorder   . Tailbone injury age 54 and age 73 and few weeks ago   broken x3  . TIA (transient ischemic attack)    x3  . 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 Vitamin D deficiency 04/08/2018   Past Surgical History:  Procedure Laterality Date  . CATARACT EXTRACTION Bilateral   . CHOLECYSTECTOMY  1997  . CYSTOSCOPY W/ URETERAL STENT PLACEMENT Left 08/14/2016   Procedure: CYSTOSCOPY WITH RETROGRADE PYELOGRAM/URETERAL LEFT URETEROSCOPY AND LEFT STENT PLACEMENT;  Surgeon: Raynelle Bring, MD;  Location: [facility];  Service: Urology;  Laterality: Left;  . CYSTOSCOPY/URETEROSCOPY/HOLMIUM LASER/STENT PLACEMENT Left 07/06/2016   Procedure: CYSTOSCOPY/URETEROSCOPY/ BILATERAL RETROGRADE/HOLMIUM LASER/STENT PLACEMENT/BASKET STONE REMOVAL;  Surgeon: Raynelle Bring, MD;  Location: [facility];  Service: Urology;  Laterality: Left;  . CYSTOSCOPY/URETEROSCOPY/HOLMIUM LASER/STENT PLACEMENT Left 08/06/2016   Procedure: CYSTOSCOPY/URETEROSCOPY/HOLMIUM LASER/STENT PLACEMENT;  Surgeon: Raynelle Bring, MD;  Location: [facility];  Service: Urology;  Laterality: Left;  . CYSTOSCOPY/URETEROSCOPY/HOLMIUM LASER/STENT PLACEMENT Left 08/31/2016   Procedure: CYSTOSCOPY/URETEROSCOPY/HOLMIUM LASER/STENT PLACEMENT/BASKET STONE REMOVAL;  Surgeon: Raynelle Bring, MD;  Location: [facility];  Service: Urology;  Laterality: Left;  . FEMORAL HERNIA REPAIR Left 1997   left leg-  . LIVER TRANSPLANTATION    . NASAL SINUS SURGERY  1997   x3  . TONSILLECTOMY  age 48  . TUBAL LIGATION      Allergies  Allergen Reactions  . Codeine Nausea Only  . 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    Marland Kitchen Penicillins Nausea Only and Other (See Comments)    Has patient had a PCN reaction causing  immediate rash, facial/tongue/throat swelling, SOB or lightheadedness with hypotension: No Has patient had a PCN reaction causing severe rash involving mucus membranes or skin necrosis: No Has patient had a PCN reaction that required hospitalization No Has patient had a PCN reaction occurring within the last 10 years: No If all of the above answers are "NO", then may proceed with Cephalosporin use.  Marland Kitchen Phenergan [Promethazine] Other (See Comments)    Reaction:  Hallucinations  Pt states that she is only allergic to IV form.   . Sulfa Antibiotics Itching  . Tamsulosin Itching  . Lidocaine Palpitations    Outpatient Encounter Medications as of 09/18/2020  Medication Sig  . aspirin EC 81 MG tablet Take 81 mg by mouth daily.  Marland Kitchen atorvastatin (LIPITOR) 10 MG tablet Take 1 tablet (10 mg total) by mouth daily.  . Biotin 800 MCG TABS Take 1 tablet by mouth.  Marland Kitchen CALCIUM-VITAMIN D PO Take 600 mg by mouth daily.  . DULoxetine (CYMBALTA) 60 MG capsule Take 1 capsule (60 mg total) by mouth daily.  . Ferrous Sulfate (IRON) 325 (65 Fe) MG TABS Take 1 tablet by mouth daily.  . folic acid (FOLVITE) 381 MCG tablet Take 400 mcg by mouth daily.  . hydrOXYzine (ATARAX/VISTARIL) 25 MG tablet Take 1 tablet (25 mg total) by mouth every 6 (six) hours as needed for itching.  . tacrolimus (  PROGRAF) 0.5 MG capsule 6 by mouth daily NAME BRAND ONLY   No facility-administered encounter medications on file as of 09/18/2020.    Review of Systems  Constitutional: Negative for appetite change, chills, fatigue and fever.  Respiratory: Negative for cough, chest tightness, shortness of breath and wheezing.   Cardiovascular: Negative for chest pain, palpitations and leg swelling.  Gastrointestinal: Negative for abdominal distention, abdominal pain, constipation, nausea and vomiting.       Liquid diarrhea has appointment for colonoscopy   Neurological: Negative for dizziness, speech difficulty, light-headedness and headaches.     Immunization History  Administered Date(s) Administered  . Fluad Quad(high Dose 65+) 02/15/2019  . Influenza, Seasonal, Injecte, Preservative Fre 03/18/2009  . Influenza,inj,Quad PF,6+ Mos 04/08/2018, 07/10/2020  . Influenza-Unspecified 03/16/2017  . Moderna Sars-Covid-2 Vaccination 06/14/2019, 07/12/2019  . Pneumococcal Conjugate-13 07/12/2018  . Pneumococcal Polysaccharide-23 07/10/2020   Pertinent  Health Maintenance Due  Topic Date Due  . INFLUENZA VACCINE  01/13/2021  . MAMMOGRAM  04/11/2022  . COLONOSCOPY (Pts 45-24yrs Insurance coverage will need to be confirmed)  01/26/2023  . DEXA SCAN  Completed  . PNA vac Low Risk Adult  Completed   Fall Risk  09/18/2020 09/13/2020 07/10/2020 04/16/2020 02/22/2020  Falls in the past year? 0 0 0 0 1  Number falls in past yr: 0 0 0 0 1  Injury with Fall? 0 0 0 0 -  Comment - - - - -  Risk for fall due to : - - - - -  Risk for fall due to: Comment - - - - -  Follow up - - - - -   Functional Status Survey:    There were no vitals filed for this visit. There is no height or weight on file to calculate BMI. Physical Exam Unable to complete on telephone visit.   Labs reviewed: Recent Labs    06/04/20 0814 07/10/20 1616  NA 141 139  K 3.3* 4.0  CL 102 103  CO2 29 25  GLUCOSE 92 96  BUN 16 13  CREATININE 1.03* 1.13*  CALCIUM 9.8 9.6  MG 1.9  --   PHOS 4.6*  --    Recent Labs    06/04/20 0814  AST 17  ALT 17  BILITOT 0.5  PROT 6.9   Recent Labs    06/04/20 0814  WBC 9.3  NEUTROABS 6,343  HGB 13.9  HCT 40.2  MCV 90.3  PLT 191   Lab Results  Component Value Date   TSH 2.98 09/06/2019   Lab Results  Component Value Date   HGBA1C 5.3 12/23/2018   Lab Results  Component Value Date   CHOL 213 (H) 02/06/2020   HDL 55 02/06/2020   LDLCALC 139 (H) 02/06/2020   TRIG 89 02/06/2020   CHOLHDL 3.9 02/06/2020    Significant Diagnostic Results in last 30 days:  No results found.  Assessment/Plan 1. Splenic  artery aneurysm [facility]) Asymptomatic.noted on recent CT scan 09/10/2020 ordered by [facility].  Increased in size 1.3 cm previous 1.2 cm (2017).does not recall evaluation by Vascular specialist.will refer today made aware specialist will call for appointment.  - Ambulatory referral to Vascular Surgery  2. Atherosclerosis of aorta (HCC) Noted on CT scan  Atorvastatin 10 mg tablet one by mouth daily ordered. Side effects discussed to notify provider for any muscle weakness or aches.  - continue on ASA daily  - Ambulatory referral to Vascular Surgery  3. Diarrhea, unspecified type Reports on and off liquid  diarrhea.None today..has upcoming appointment for colonoscopy would like to discuss with [facility] aware specialist office will call her for appointment.  - Ambulatory referral to Gastroenterology  Family/ staff Communication: Reviewed plan of care with patient verbalized understanding.  Labs/tests ordered: None    Next Appointment: Has appointment with PCP Dani Gobble 01/13/2021  I connected with  Josephine Cables on 09/18/20 by a video enabled telemedicine application and verified that I am speaking with the correct person using two identifiers.   I discussed the limitations of evaluation and management by telemedicine. The patient expressed understanding and agreed to proceed.   Spent 25 minutes of non-face to face on telephone  with patient    Sandrea Hughs, NP

## 2020-09-18 NOTE — Telephone Encounter (Signed)
Called patient and results discussed with her and understood. Patient wants referral to vascular specialist for further evaluation. Patient wrote everything stated down on paper. I also told patient I will send her copy of note in mail for documentation. Patient states she wants Atorvastatin 10mg  sent to [facility] in [place]. Medication sent to pharmacy. Patient scheduled for Telehealth visit today 09/18/2020 at 10:30am. Message routed back to Marlowe Sax, NP

## 2020-09-18 NOTE — Telephone Encounter (Deleted)
Patient called again to be informed that Marlowe Sax, NP will speak to her. There was no answer. Voicemail was left with office call back number. I will attempt call again.

## 2020-09-18 NOTE — Telephone Encounter (Signed)
Error

## 2020-09-18 NOTE — Telephone Encounter (Signed)
Patient called again to be informed that Marlowe Sax, NP will speak to her. There was no answer. Voicemail was left with office call back number. I will attempt call again.

## 2020-09-18 NOTE — Telephone Encounter (Signed)
noted 

## 2020-09-18 NOTE — Telephone Encounter (Signed)
This encounter was created in error - please disregard.

## 2020-09-25 ENCOUNTER — Encounter: Payer: Self-pay | Admitting: Vascular Surgery

## 2020-09-25 ENCOUNTER — Other Ambulatory Visit: Payer: Self-pay

## 2020-09-25 ENCOUNTER — Ambulatory Visit (INDEPENDENT_AMBULATORY_CARE_PROVIDER_SITE_OTHER): Payer: Medicare Other | Admitting: Vascular Surgery

## 2020-09-25 VITALS — BP 166/95 | HR 84 | Temp 97.9°F | Resp 20 | Ht 63.0 in | Wt 146.0 lb

## 2020-09-25 DIAGNOSIS — I728 Aneurysm of other specified arteries: Secondary | ICD-10-CM | POA: Diagnosis not present

## 2020-09-25 NOTE — Progress Notes (Signed)
REASON FOR CONSULT:    Splenic artery aneurysm.  The patient was referred by [facility].   ASSESSMENT & PLAN:   SPLENIC ARTERY ANEURYSM: This patient has an asymptomatic small 1.3 cm calcified splenic artery aneurysm.  I think the aneurysm is fairly small and is at very low risk for rupture.  I recommend a follow-up CT abdomen in 1 year and I will see her back at that time.  I tried to reassure her that I do not think she needs to be too concerned about this as the risk of rupture is extremely low.  I think we can simply follow this.  If the aneurysm has not changed in size in 1 year we might build to stretch her follow-up out to 18 months or 2 years.  RIGHT THIGH PAIN: I think her right thigh pain which has resolved was most likely musculoskeletal in origin.  I reassured her that she had no evidence of significant arterial or venous disease that would explain her symptoms.   Deitra Mayo, MD Office: [phone]   HPI:   Candace Hill is a pleasant 75 y.o. female, who had [facility] by [facility] and an incidental finding was a 1.3 cm calcified splenic artery aneurysm.  For this reason the patient was sent for vascular consultation.  The patient denies any abdominal pain, flank pain, or back pain.  Her only complaint was some pain in her right thigh recently which lasted about 2 weeks.  This was in her anterior thigh.  There were no aggravating or alleviating factors.  She did not remember any specific injury to her leg.  She did undergo a liver transplant in 2008 in [place] and has done very well from that standpoint.  Her risk factors for peripheral vascular disease include hypertension.  She also has hypercholesterolemia.  She denies any history of diabetes.  She was adopted so she does not know her family history.  She is not a smoker.  Past Medical History:  Diagnosis Date  . Allergic rhinitis 11/14/2012  . Amblyopia of eye, left 06/17/2017  . Asbestos  exposure 04/08/2018  . Asthma    [phone] due to black mold house, no asthma now  . Broken ankle    right  . Broken rib    x2  . Central retinal vein occlusion    left eye retinal occlusion  . Chronic hepatitis C virus infection 05/31/2013  . Complication of anesthesia    IV phenergan  serious mental reaction  . Depression   . Essential hypertension 10/14/2017  . Gastroesophageal reflux disease 10/14/2017  . Hernia, femoral    left  . History of blood transfusion 1971   acquired hepatitis c  . History of kidney stones   . History of liver transplant 11/14/2012  . History of nephrolithiasis 10/14/2017  . History of renal stone   . History of shingles   . Hyperglycemia 04/08/2018  . Malignant neoplasm of liver 11/14/2012  . Migraine 07/02/2015  . 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 Seasonal affective disorder   . Tailbone injury age 50 and age 70 and few weeks ago   broken x3  . TIA (transient ischemic attack)    x3  . 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 Vitamin D deficiency 04/08/2018    Family History  Adopted: Yes  Problem Relation Age of Onset  . Stroke Mother   .  Alzheimer's disease Mother   . Cancer Mother   . Diabetes Mother        Questionable, per new patient packet   . Arthritis Mother        Questionable, per new patient packet   . Alcoholism Father   . COPD Brother        Heavy smoker, per new patient packet   . Suicidality Sister   . Depression Sister   . COPD Sister   . OCD Daughter   . Personality disorder Daughter   . Alcoholism Son   . Bipolar disorder Granddaughter        Age 35 as of 07/10/2020  . Schizophrenia Granddaughter     SOCIAL HISTORY: Social History   Socioeconomic History  . Marital status: Divorced    Spouse name: Not on file  . Number of children: 2  . Years of education: 5  . Highest education level: Associate degree: occupational, Hotel manager, or  vocational program  Occupational History  . Occupation: Disabled  Tobacco Use  . Smoking status: Never Smoker  . Smokeless tobacco: Never Used  Vaping Use  . Vaping Use: Never used  Substance and Sexual Activity  . Alcohol use: No  . Drug use: No  . Sexual activity: Never    Comment: 14 years ago  Other Topics Concern  . Not on file  Social History Narrative   Lives at alone alone.   Right-handed.   No caffeine use.      PSC- as of 10/12/17   Diet: No Fried Foods       Caffeine: Summer time, sweet tea       Married, if yes what year: Divorced       Do you live in a house, apartment, assisted living, condo, trailer, ect: [place], 1 stories, and 1 person       Pets: 1 dog       Current/Past profession: Retail buyer       Exercise: Some, walking dog       Living Will: yes   DNR: yes   POA/HPOA: yes      Functional Status: Patient did not answer   Do you have difficulty bathing or dressing yourself?   Do you have difficulty preparing food or eating?   Do you have difficulty managing your medications?   Do you have difficulty managing your finances?   Do you have difficulty affording your medications?   Social Determinants of Health   Financial Resource Strain: Not on file  Food Insecurity: Not on file  Transportation Needs: Not on file  Physical Activity: Not on file  Stress: Not on file  Social Connections: Not on file  Intimate Partner Violence: Not on file    Allergies  Allergen Reactions  . Codeine Nausea Only  . 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    Marland Kitchen Penicillins Nausea Only and Other (See Comments)    Has patient had a PCN reaction causing immediate rash, facial/tongue/throat swelling, SOB or lightheadedness with hypotension: No Has patient had a PCN reaction causing severe rash involving mucus membranes or skin necrosis: No Has patient had a PCN reaction that required hospitalization No Has  patient had a PCN reaction occurring within the last 10 years: No If all of the above answers are "NO", then may proceed with Cephalosporin use.  Marland Kitchen Phenergan [Promethazine] Other (See Comments)    Reaction:  Hallucinations  Pt states that she is only allergic to IV form.   . Sulfa Antibiotics Itching  . Tamsulosin Itching  . Lidocaine Palpitations    Current Outpatient Medications  Medication Sig Dispense Refill  . aspirin EC 81 MG tablet Take 81 mg by mouth daily.    Marland Kitchen atorvastatin (LIPITOR) 10 MG tablet Take 1 tablet (10 mg total) by mouth daily. 30 tablet 0  . Biotin 800 MCG TABS Take 1 tablet by mouth.    Marland Kitchen CALCIUM-VITAMIN D PO Take 600 mg by mouth daily.    . DULoxetine (CYMBALTA) 60 MG capsule Take 1 capsule (60 mg total) by mouth daily. 90 capsule 1  . Ferrous Sulfate (IRON) 325 (65 Fe) MG TABS Take 1 tablet by mouth daily.    . folic acid (FOLVITE) 962 MCG tablet Take 400 mcg by mouth daily.    . hydrOXYzine (ATARAX/VISTARIL) 25 MG tablet Take 1 tablet (25 mg total) by mouth every 6 (six) hours as needed for itching. 30 tablet 0  . tacrolimus (PROGRAF) 0.5 MG capsule 6 by mouth daily NAME BRAND ONLY     No current facility-administered medications for this visit.    REVIEW OF SYSTEMS:  [X]  denotes positive finding, [ ]  denotes negative finding Cardiac  Comments:  Chest pain or chest pressure:    Shortness of breath upon exertion:    Short of breath when lying flat:    Irregular heart rhythm:        Vascular    Pain in calf, thigh, or hip brought on by ambulation:    Pain in feet at night that wakes you up from your sleep:     Blood clot in your veins:    Leg swelling:         Pulmonary    Oxygen at home:    Productive cough:     Wheezing:         Neurologic    Sudden weakness in arms or legs:     Sudden numbness in arms or legs:     Sudden onset of difficulty speaking or slurred speech:    Temporary loss of vision in one eye:     Problems with dizziness:          Gastrointestinal    Blood in stool:     Vomited blood:         Genitourinary    Burning when urinating:     Blood in urine:        Psychiatric    Major depression:         Hematologic    Bleeding problems:    Problems with blood clotting too easily:        Skin    Rashes or ulcers:        Constitutional    Fever or chills:     PHYSICAL EXAM:   Vitals:   09/25/20 1040  BP: (!) 166/95  Pulse: 84  Resp: 20  Temp: 97.9 F (36.6 C)  SpO2: 97%  Weight: 146 lb (66.2 kg)  Height: 5\' 3"  (1.6 m)    GENERAL: The patient is a well-nourished female, in no acute distress. The vital signs are documented above. CARDIAC: There is a regular rate and rhythm.  VASCULAR: I do not detect carotid bruits. She had a right dorsalis pedis pulse.  I cannot palpate pulses on the left foot. On the right side she had a biphasic peroneal and dorsalis pedis signal with a Doppler.  I cannot obtain a posterior tibial signal. On the left side she had a biphasic peroneal dorsalis pedis signal.  I cannot obtain a posterior tibial signal. PULMONARY: There is good air exchange bilaterally without wheezing or rales. ABDOMEN: Soft and non-tender with normal pitched bowel sounds.  I do not palpate an aneurysm. MUSCULOSKELETAL: There are no major deformities or cyanosis. NEUROLOGIC: No focal weakness or paresthesias are detected. SKIN: There are no ulcers or rashes noted. PSYCHIATRIC: The patient has a normal affect.  DATA:    CT abdomen pelvis: I reviewed the images of her CT the abdomen and pelvis that was done on 09/10/2020.  This shows a calcified 1.3 cm splenic artery aneurysm out at the hilum.

## 2020-10-09 ENCOUNTER — Other Ambulatory Visit: Payer: Self-pay | Admitting: Family

## 2020-10-16 DIAGNOSIS — F331 Major depressive disorder, recurrent, moderate: Secondary | ICD-10-CM | POA: Diagnosis not present

## 2020-10-17 ENCOUNTER — Telehealth: Payer: Self-pay | Admitting: *Deleted

## 2020-10-17 NOTE — Telephone Encounter (Signed)
Dr.Danis,  This patient is for direct screening? Colonoscopy with you on 6/2. Her last GI OV was on 05/23/20.  It looks like the referral was for diarrhea. Ok for patient to proceed with the colonoscopy as scheduled at [facility]? Please advise. Thank you, Rosendo Couser pv

## 2020-10-18 NOTE — Telephone Encounter (Signed)
6-16 OV with Danis 120 pm - I cancelled PV 5-19  and Colon 6-2

## 2020-10-18 NOTE — Telephone Encounter (Signed)
Thank you for the chart review and the note.  I do not know why she was sent to [facility] direct book for diarrhea.  She is a complex patient with a prior liver transplant.  No direct book colon.  Please arrange for her to see me in clinic.  - HD

## 2020-10-19 ENCOUNTER — Other Ambulatory Visit: Payer: Self-pay | Admitting: Family

## 2020-10-21 MED ORDER — ATORVASTATIN CALCIUM 10 MG PO TABS: 1.0000 | ORAL_TABLET | Freq: Every day | ORAL | 0 refills | Status: DC

## 2020-10-21 MED ORDER — ATORVASTATIN CALCIUM 10 MG PO TABS: 10.0000 mg | ORAL_TABLET | Freq: Every day | ORAL | 0 refills | Status: DC

## 2020-10-21 NOTE — Addendum Note (Signed)
Addended by: Rafael Bihari A on: 10/21/2020 10:03 AM   Modules accepted: Orders

## 2020-10-25 DIAGNOSIS — Z944 Liver transplant status: Principal | ICD-10-CM

## 2020-10-25 DIAGNOSIS — Z79899 Other long term (current) drug therapy: Principal | ICD-10-CM

## 2020-10-25 MED ORDER — PROGRAF 0.5 MG CAPSULE
ORAL_CAPSULE | Freq: Two times a day (BID) | ORAL | 1 refills | 0.00000 days | Status: CP
Start: 2020-10-25 — End: ?

## 2020-10-28 ENCOUNTER — Other Ambulatory Visit: Payer: Self-pay

## 2020-10-28 ENCOUNTER — Other Ambulatory Visit: Payer: Self-pay | Admitting: Nurse Practitioner

## 2020-10-28 DIAGNOSIS — Z79899 Other long term (current) drug therapy: Principal | ICD-10-CM

## 2020-10-28 DIAGNOSIS — Z944 Liver transplant status: Principal | ICD-10-CM

## 2020-10-28 MED ORDER — TACROLIMUS 0.5 MG CAPSULE, IMMEDIATE-RELEASE
ORAL_CAPSULE | Freq: Two times a day (BID) | ORAL | 2 refills | 30.00000 days | Status: CP
Start: 2020-10-28 — End: 2020-10-28

## 2020-10-28 NOTE — Telephone Encounter (Signed)
Patient called stating she is needing a refill of tacrolimus. Last refill was: 01/31/2020  Is it ok to refill medication? Please advise. Thank you.

## 2020-10-28 NOTE — Telephone Encounter (Signed)
Called patient and informed. Patient stated she does not have a liver specialist and has been receiving this medication from here. Please advise.

## 2020-10-28 NOTE — Telephone Encounter (Signed)
No she gets this through her liver specialist at [facility]. They will be the ones that need to refill.

## 2020-10-29 NOTE — Telephone Encounter (Signed)
Called and informed patient of the office she should contact. Patient got information and said she would call.

## 2020-10-29 NOTE — Telephone Encounter (Signed)
We do not refill this medication. She should be seeing the liver transplant routinely and likely needs follow up with them.      [facility] [place]   [address]   [place], [place] [address]   [phone]

## 2020-10-30 DIAGNOSIS — F411 Generalized anxiety disorder: Secondary | ICD-10-CM | POA: Diagnosis not present

## 2020-10-30 DIAGNOSIS — F331 Major depressive disorder, recurrent, moderate: Secondary | ICD-10-CM | POA: Diagnosis not present

## 2020-11-14 ENCOUNTER — Encounter: Payer: Medicare Other | Admitting: Gastroenterology

## 2020-11-28 ENCOUNTER — Ambulatory Visit (INDEPENDENT_AMBULATORY_CARE_PROVIDER_SITE_OTHER): Payer: Medicare Other | Admitting: Gastroenterology

## 2020-11-28 ENCOUNTER — Encounter: Payer: Self-pay | Admitting: Gastroenterology

## 2020-11-28 VITALS — BP 140/80 | HR 87 | Ht 63.0 in | Wt 149.0 lb

## 2020-11-28 DIAGNOSIS — Z944 Liver transplant status: Secondary | ICD-10-CM

## 2020-11-28 DIAGNOSIS — R194 Change in bowel habit: Secondary | ICD-10-CM | POA: Diagnosis not present

## 2020-11-28 MED ORDER — GOLYTELY 236 G PO SOLR: 4000.0000 mL | Freq: Once | ORAL | 0 refills | Status: AC

## 2020-11-28 NOTE — Patient Instructions (Addendum)
If you are age 73 or older, your body mass index should be between 23-30. Your Body mass index is 26.39 kg/m. If this is out of the aforementioned range listed, please consider follow up with your Primary Care Provider.  If you are age 53 or younger, your body mass index should be between 19-25. Your Body mass index is 26.39 kg/m. If this is out of the aformentioned range listed, please consider follow up with your Primary Care Provider.   __________________________________________________________  The [place] [facility] providers would like to encourage you to use [facility] to communicate with providers for non-urgent requests or questions.  Due to long hold times on the telephone, sending your provider a message by [facility] may be a faster and more efficient way to get a response.  Please allow 48 business hours for a response.  Please remember that this is for non-urgent requests.   You have been scheduled for a colonoscopy. Please follow written instructions given to you at your visit today.  Please pick up your prep supplies at the pharmacy within the next 1-3 days. If you use inhalers (even only as needed), please bring them with you on the day of your procedure.  Due to recent changes in healthcare laws, you may see the results of your imaging and laboratory studies on MyChart before your provider has had a chance to review them.  We understand that in some cases there may be results that are confusing or concerning to you. Not all laboratory results come back in the same time frame and the provider may be waiting for multiple results in order to interpret others.  Please give [place] 48 hours in order for your provider to thoroughly review all the results before contacting the office for clarification of your results.    It was a pleasure to see you today!  Thank you for trusting me with your gastrointestinal care!

## 2020-11-28 NOTE — Progress Notes (Signed)
[facility] Progress Note  Chief Complaint: Change in bowel habits  Subjective  History: From my 05/23/2020 office note" Patient was seen once on 03/14/2019 for altered bowel habits with small, soft pellet-like stools, sometimes of a "jelly" consistency with urgency and small volume incontinence.  She had feelings of incomplete evacuation.  Colonoscopy reportedly about 8 years prior to that (?  Dr. Collene Mares).  Patient was referred to [facility] surgery for the fecal incontinence, and I recommended a colonoscopy with [place], but she decided not to pursue that at the time. 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 _______________ [name] was here with a friend today, who has been trying to help her navigate medical care and her life in general.  [name] still has not had follow-up with [facility] hepatology/liver transplant throughout the pandemic, but it appears that they have been refilling her Prograf.  She says they wanted to have a liver biopsy, but she does not want to go there because [facility] is a "designated Covid hospital".  As before, she has a tangential and circumferential thought pattern and it was somewhat difficult to communicate today.  She had questions about whether or not she should be taking various supplements that she brought today. She denies abdominal pain or bowel habits or rectal bleeding.  She is fixated on perceived mistakes in failings of some medical care she may have received around the time or after her liver transplant.   (It took our CMA 30 minutes to do the intake for this patient)"  Patient was then strongly advised to follow-up with the [facility] transplant clinic as soon as possible. Care everywhere notes show that she has been in at least phone contact with that clinic for management of her Prograf, no office visits in person that I can see.  Most recent phone contact April 12. [name] says that  clinic is wanted her to come in for a biopsy, but she is still reluctant to do it because they are "a designated COVID hospital".  [name] also feels it is not necessary because her liver labs are persistently normal.  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 Scot Dock of vascular surgery, who recommends following it with serial imaging. _________________________________________________  Describes a change in bowel habits, with pellet-like stools and feelings of urgency at times (think she had reported before), but she no longer has "jelly like" consistency stools. Some when she knows recommended a natural remedy that were certain seeds you would have to pulverize and put into a drink, which she cannot remember what it was so she did not try it. She denies rectal bleeding, nausea or vomiting   ROS: Cardiovascular:  no chest pain Respiratory: no dyspnea  The patient's Past Medical, Family and Social History were reviewed and are on file in the EMR.  Objective:  Med list reviewed  Current Outpatient Medications:    aspirin EC 81 MG tablet, Take 81 mg by mouth daily., Disp: , Rfl:    atorvastatin (LIPITOR) 10 MG tablet, Take 1 tablet (10 mg total) by mouth daily., Disp: 90 tablet, Rfl: 0   Biotin 800 MCG TABS, Take 1 tablet by mouth., Disp: , Rfl:    CALCIUM-VITAMIN D PO, Take 600 mg by mouth daily., Disp: , Rfl:    DULoxetine (CYMBALTA) 60 MG capsule, Take 1 capsule (60 mg  total) by mouth daily., Disp: 90 capsule, Rfl: 1   Ferrous Sulfate (IRON) 325 (65 Fe) MG TABS, Take 1 tablet by mouth daily., Disp: , Rfl:    folic acid (FOLVITE) 299 MCG tablet, Take 400 mcg by mouth daily., Disp: , Rfl:    hydrOXYzine (ATARAX/VISTARIL) 25 MG tablet, Take 1 tablet (25 mg total) by mouth every 6 (six) hours as needed for itching., Disp: 30 tablet, Rfl: 0   tacrolimus (PROGRAF) 0.5 MG capsule, 6 by mouth daily NAME BRAND ONLY, Disp: , Rfl:     Vital signs in last 24 hrs: Vitals:   11/28/20 1321  BP: 140/80  Pulse: 87   Wt Readings from Last 3 Encounters:  11/28/20 149 lb (67.6 kg)  09/25/20 146 lb (66.2 kg)  09/13/20 145 lb (65.8 kg)    Physical Exam  Well-groomed, pleasant, conversational, has extensive papers with her that she is noting and organizing.  Tangential historian as before HEENT: sclera anicteric, oral mucosa moist without lesions Neck: supple, no thyromegaly, JVD or lymphadenopathy Cardiac: RRR without murmurs, S1S2 heard, no peripheral edema Pulm: clear to auscultation bilaterally, normal RR and effort noted Abdomen: soft, chevron incision, no tenderness, with active bowel sounds. No guarding or palpable hepatosplenomegaly. Skin; warm and dry, no jaundice or rash  Labs:   ___________________________________________ Radiologic studies:   ____________________________________________ Other:   _____________________________________________ Assessment & Plan  Assessment: Encounter Diagnoses  Name Primary?   Change in bowel habits Yes   Status post liver transplant ([place])     Reported change in bowel habits, not clear how much this is a change from before.  Sounds like maybe she has diverticulosis, less likely neoplasm. No red flag symptoms  I still recommend she follow-up in person with her [facility] liver transplant clinic, but she seems disinclined to do so.  Plan: Colonoscopy.  She was agreeable after discussion of procedure and risks.  The benefits and risks of the planned procedure were described in detail with the patient or (when appropriate) their health care proxy.  Risks were outlined as including, but not limited to, bleeding, infection, perforation, adverse medication reaction leading to cardiac or pulmonary decompensation, pancreatitis (if ERCP).  The limitation of incomplete mucosal visualization was also discussed.  No guarantees or warranties were given.   22 minutes were spent on  this encounter (including chart review, history/exam, counseling/coordination of care, and documentation) > 50% of that time was spent on counseling and coordination of care.  Topics discussed included: See above.  Nelida Meuse III

## 2020-12-02 DIAGNOSIS — F331 Major depressive disorder, recurrent, moderate: Secondary | ICD-10-CM | POA: Diagnosis not present

## 2020-12-03 ENCOUNTER — Telehealth: Payer: Self-pay | Admitting: Gastroenterology

## 2020-12-03 NOTE — Telephone Encounter (Signed)
Inbound call from pt requesting a call back stating that she needs clarification on her prep. Please advise. Thanks.

## 2020-12-04 NOTE — Telephone Encounter (Signed)
Left message to return call 

## 2020-12-23 ENCOUNTER — Other Ambulatory Visit: Payer: Self-pay | Admitting: Nurse Practitioner

## 2020-12-23 ENCOUNTER — Other Ambulatory Visit: Payer: Self-pay | Admitting: Family

## 2020-12-23 DIAGNOSIS — F419 Anxiety disorder, unspecified: Secondary | ICD-10-CM

## 2020-12-23 DIAGNOSIS — F339 Major depressive disorder, recurrent, unspecified: Secondary | ICD-10-CM

## 2020-12-23 MED ORDER — DULOXETINE HCL 60 MG PO CPEP: ORAL_CAPSULE | ORAL | 0 refills | Status: DC

## 2020-12-23 MED ORDER — ATORVASTATIN CALCIUM 10 MG PO TABS: ORAL_TABLET | ORAL | 0 refills | Status: DC

## 2020-12-23 NOTE — Telephone Encounter (Signed)
Pharmacy requested refill

## 2021-01-09 DIAGNOSIS — F331 Major depressive disorder, recurrent, moderate: Secondary | ICD-10-CM | POA: Diagnosis not present

## 2021-01-09 DIAGNOSIS — F411 Generalized anxiety disorder: Secondary | ICD-10-CM | POA: Diagnosis not present

## 2021-01-13 ENCOUNTER — Ambulatory Visit: Payer: Medicare Other | Admitting: Nurse Practitioner

## 2021-01-15 ENCOUNTER — Ambulatory Visit (INDEPENDENT_AMBULATORY_CARE_PROVIDER_SITE_OTHER): Payer: Medicare Other | Admitting: Internal Medicine

## 2021-01-15 ENCOUNTER — Other Ambulatory Visit: Payer: Self-pay

## 2021-01-15 ENCOUNTER — Encounter: Payer: Self-pay | Admitting: Internal Medicine

## 2021-01-15 VITALS — BP 130/80 | HR 92 | Temp 98.1°F | Resp 16 | Ht 63.0 in | Wt 148.0 lb

## 2021-01-15 DIAGNOSIS — N1832 Chronic kidney disease, stage 3b: Secondary | ICD-10-CM | POA: Diagnosis not present

## 2021-01-15 DIAGNOSIS — L2084 Intrinsic (allergic) eczema: Secondary | ICD-10-CM | POA: Diagnosis not present

## 2021-01-15 DIAGNOSIS — E785 Hyperlipidemia, unspecified: Secondary | ICD-10-CM | POA: Diagnosis not present

## 2021-01-15 DIAGNOSIS — I10 Essential (primary) hypertension: Secondary | ICD-10-CM

## 2021-01-15 HISTORY — DX: Hyperlipidemia, unspecified: E78.5

## 2021-01-15 HISTORY — DX: Chronic kidney disease, stage 3b: N18.32

## 2021-01-15 HISTORY — DX: Intrinsic (allergic) eczema: L20.84

## 2021-01-15 LAB — CBC WITH DIFFERENTIAL/PLATELET
Basophils Absolute: 0.1 10*3/uL (ref 0.0–0.1)
Basophils Relative: 0.7 % (ref 0.0–3.0)
Eosinophils Absolute: 0.2 10*3/uL (ref 0.0–0.7)
Eosinophils Relative: 2.2 % (ref 0.0–5.0)
HCT: 41.3 % (ref 36.0–46.0)
Hemoglobin: 14.4 g/dL (ref 12.0–15.0)
Lymphocytes Relative: 21.3 % (ref 12.0–46.0)
Lymphs Abs: 2.1 10*3/uL (ref 0.7–4.0)
MCHC: 34.9 g/dL (ref 30.0–36.0)
MCV: 89.4 fl (ref 78.0–100.0)
Monocytes Absolute: 0.7 10*3/uL (ref 0.1–1.0)
Monocytes Relative: 7 % (ref 3.0–12.0)
Neutro Abs: 6.9 10*3/uL (ref 1.4–7.7)
Neutrophils Relative %: 68.8 % (ref 43.0–77.0)
Platelets: 216 10*3/uL (ref 150.0–400.0)
RBC: 4.62 Mil/uL (ref 3.87–5.11)
RDW: 13.4 % (ref 11.5–15.5)
WBC: 10 10*3/uL (ref 4.0–10.5)

## 2021-01-15 LAB — BASIC METABOLIC PANEL
BUN: 12 mg/dL (ref 6–23)
CO2: 29 mEq/L (ref 19–32)
Calcium: 9.5 mg/dL (ref 8.4–10.5)
Chloride: 101 mEq/L (ref 96–112)
Creatinine, Ser: 1.27 mg/dL — ABNORMAL HIGH (ref 0.40–1.20)
GFR: 42.03 mL/min — ABNORMAL LOW (ref >60.00)
Glucose, Bld: 101 mg/dL — ABNORMAL HIGH (ref 70–99)
Potassium: 3.5 mEq/L (ref 3.5–5.1)
Sodium: 141 mEq/L (ref 135–145)

## 2021-01-15 LAB — LIPID PANEL
Cholesterol: 142 mg/dL (ref 0–200)
HDL: 52.1 mg/dL (ref >39.00)
LDL Cholesterol: 62 mg/dL (ref 0–99)
NonHDL: 90.33
Total CHOL/HDL Ratio: 3
Triglycerides: 144 mg/dL (ref 0.0–149.0)
VLDL: 28.8 mg/dL (ref 0.0–40.0)

## 2021-01-15 LAB — HEPATIC FUNCTION PANEL
ALT: 13 U/L (ref 0–35)
AST: 18 U/L (ref 0–37)
Albumin: 4.5 g/dL (ref 3.5–5.2)
Alkaline Phosphatase: 60 U/L (ref 39–117)
Bilirubin, Direct: 0.1 mg/dL (ref 0.0–0.3)
Total Bilirubin: 0.6 mg/dL (ref 0.2–1.2)
Total Protein: 7.5 g/dL (ref 6.0–8.3)

## 2021-01-15 LAB — TSH: TSH: 3.84 u[IU]/mL (ref 0.35–5.50)

## 2021-01-15 MED ORDER — FLUOCINONIDE 0.05 % EX CREA
1.0000 | TOPICAL_CREAM | Freq: Two times a day (BID) | CUTANEOUS | 2 refills | Status: DC
Start: 2021-01-15 — End: 2021-07-14

## 2021-01-15 NOTE — Patient Instructions (Signed)
Atopic Dermatitis Atopic dermatitis is a skin disorder that causes inflammation of the skin. It is marked by a red rash and itchy, dry, scaly skin. It is the most common type of eczema. Eczema is a group of skin conditions that cause the skin to become rough and swollen. This condition is generally worse during the cooler wintermonths and often improves during the warm summer months. Atopic dermatitis usually starts showing signs in infancy and can last through adulthood. This condition cannot be passed from one person to another (is not contagious). Atopic dermatitis may not always be present, but when it is, it is called aflare-up. What are the causes? The exact cause of this condition is not known. Flare-ups may be triggered by: Coming in contact with something that you are sensitive or allergic to (allergen). Stress. Certain foods. Extremely hot or cold weather. Harsh chemicals and soaps. Dry air. Chlorine. What increases the risk? This condition is more likely to develop in people who have a personal or family history of: Eczema. Allergies. Asthma. Hay fever. What are the signs or symptoms? Symptoms of this condition include: Dry, scaly skin. Red, itchy rash. Itchiness, which can be severe. This may occur before the skin rash. This can make sleeping difficult. Skin thickening and cracking that can occur over time. How is this diagnosed? This condition is diagnosed based on: Your symptoms. Your medical history. A physical exam. How is this treated? There is no cure for this condition, but symptoms can usually be controlled. Treatment focuses on: Controlling the itchiness and scratching. You may be given medicines, such as antihistamines or steroid creams. Limiting exposure to allergens. Recognizing situations that cause stress and developing a plan to manage stress. If your atopic dermatitis does not get better with medicines, or if it is all over your body (widespread), a  treatment using a specific type of light (phototherapy) may be used. Follow these instructions at home: Skin care  Keep your skin well moisturized. Doing this seals in moisture and helps to prevent dryness. Use unscented lotions that have petroleum in them. Avoid lotions that contain alcohol or water. They can dry the skin. Keep baths or showers short (less than 5 minutes) in warm water. Do not use hot water. Use mild, unscented cleansers for bathing. Avoid soap and bubble bath. Apply a moisturizer to your skin right after a bath or shower. Do not apply anything to your skin without checking with your health care provider.  General instructions Take or apply over-the-counter and prescription medicines only as told by your health care provider. Dress in clothes made of cotton or cotton blends. Dress lightly because heat increases itchiness. When washing your clothes, rinse your clothes twice so all of the soap is removed. Avoid any triggers that can cause a flare-up. Keep your fingernails cut short. Avoid scratching. Scratching makes the rash and itchiness worse. A break in the skin from scratching could result in a skin infection (impetigo). Do not be around people who have cold sores or fever blisters. If you get the infection, it may cause your atopic dermatitis to worsen. Keep all follow-up visits. This is important. Contact a health care provider if: Your itchiness interferes with sleep. Your rash gets worse or is not better within one week of starting treatment. You have a fever. You have a rash flare-up after having contact with someone who has cold sores or fever blisters. Get help right away if: You develop pus or soft yellow scabs in the rash area.   Summary Atopic dermatitis causes a red rash and itchy, dry, scaly skin. Treatment focuses on controlling the itchiness and scratching, limiting exposure to things that you are sensitive or allergic to (allergens), recognizing  situations that cause stress, and developing a plan to manage stress. Keep your skin well moisturized. Keep baths or showers shorter than 5 minutes and use warm water. Do not use hot water. This information is not intended to replace advice given to you by your health care provider. Make sure you discuss any questions you have with your healthcare provider. Document Revised: 03/11/2020 Document Reviewed: 03/11/2020 Elsevier Patient Education  2022 Elsevier Inc.  

## 2021-01-15 NOTE — Progress Notes (Signed)
Subjective:  Patient ID: Candace Hill, female    DOB: [birth year]  Age: 75 y.o. MRN: [id number]  CC: Hyperlipidemia  This visit occurred during the SARS-CoV-2 public health emergency.  Safety protocols were in place, including screening questions prior to the visit, additional usage of staff PPE, and extensive cleaning of exam room while observing appropriate contact time as indicated for disinfecting solutions.    HPI Candace Hill presents for f/up and to establish.  She complains of a 20-monthhistory of itchy rash on her left lower extremity.  She has not treated it.  She is active and denies any recent episodes of chest pain, shortness of breath, diaphoresis, dizziness, lightheadedness, or edema.  She has a complex medical history.  History PLutitiahas a past medical history of Allergic rhinitis (11/14/2012), Amblyopia of eye, left (06/17/2017), Asbestos exposure (04/08/2018), Asthma, Broken ankle, Broken rib, Central retinal vein occlusion, Chronic hepatitis C virus infection ([id number], Complication of anesthesia, Depression, Essential hypertension (10/14/2017), Gastroesophageal reflux disease (10/14/2017), Hernia, femoral, History of blood transfusion (1971), History of kidney stones, History of liver transplant (11/14/2012), History of nephrolithiasis (10/14/2017), History of renal stone, History of shingles, Hyperglycemia (04/08/2018), Malignant neoplasm of liver (11/14/2012), Migraine (07/02/2015), 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   She has a past surgical history that includes Cataract extraction (Bilateral); Tubal ligation; Nasal sinus surgery (1997); Tonsillectomy (age 75; Femoral hernia repair (Left, 1997); Cystoscopy/ureteroscopy/holmium laser/stent  placement (Left, 07/06/2016); Cholecystectomy (1997); Cystoscopy/ureteroscopy/holmium laser/stent placement (Left, 08/06/2016); Cystoscopy w/ ureteral stent placement (Left, 08/14/2016); Cystoscopy/ureteroscopy/holmium laser/stent placement (Left, 08/31/2016); and Liver transplantation.   Her family history includes Alcoholism in her father and son; Alzheimer's disease in her mother; Arthritis in her mother; Bipolar disorder in her granddaughter; COPD in her brother and sister; Cancer in her mother; Depression in her sister; Diabetes in her mother; OCD in her daughter; Personality disorder in her daughter; Schizophrenia in her granddaughter; Stroke in her mother; Suicidality in her sister. She was adopted.She reports that she has never smoked. She has never used smokeless tobacco. She reports that she does not drink alcohol and does not use drugs.  Outpatient Medications Prior to Visit  Medication Sig Dispense Refill   aspirin EC 81 MG tablet Take 81 mg by mouth daily.     atorvastatin (LIPITOR) 10 MG tablet Take one tablet by mouth once daily for cholesterol. 90 tablet 0   Biotin 800 MCG TABS Take 1 tablet by mouth.     CALCIUM-VITAMIN D PO Take 600 mg by mouth daily.     DULoxetine (CYMBALTA) 60 MG capsule Take one capsule by mouth once daily. 90 capsule 0   Ferrous Sulfate (IRON) 325 (65 Fe) MG TABS Take 1 tablet by mouth daily.     folic acid (FOLVITE) 4A999333MCG tablet Take 400 mcg by mouth daily.     hydrOXYzine (ATARAX/VISTARIL) 25 MG tablet Take 1 tablet (25 mg total) by mouth every 6 (six) hours as needed for itching. 30 tablet 0   tacrolimus (PROGRAF) 0.5 MG capsule 6 by mouth daily NAME BRAND ONLY     No facility-administered medications prior to visit.    ROS Review of Systems  Constitutional:  Positive for fatigue. Negative for diaphoresis.  HENT: Negative.    Eyes: Negative.   Respiratory:  Negative for cough, chest tightness, shortness of breath  and wheezing.   Cardiovascular:   Negative for chest pain, palpitations and leg swelling.  Gastrointestinal:  Negative for abdominal pain, diarrhea, nausea and vomiting.  Endocrine: Negative.   Genitourinary: Negative.  Negative for decreased urine volume, difficulty urinating, dysuria and urgency.  Musculoskeletal: Negative.  Negative for arthralgias and myalgias.  Skin:  Positive for rash. Negative for color change.  Neurological: Negative.  Negative for dizziness, weakness and light-headedness.  Hematological:  Negative for adenopathy. Does not bruise/bleed easily.  Psychiatric/Behavioral:  Positive for dysphoric mood. Negative for sleep disturbance and suicidal ideas. The patient is nervous/anxious.    Objective:  BP 130/80 (BP Location: Left Arm, Patient Position: Sitting, Cuff Size: Large)   Pulse 92   Temp 98.1 F (36.7 C) (Oral)   Resp 16   Ht '5\' 3"'$  (1.6 m)   Wt 148 lb (67.1 kg)   SpO2 98%   BMI 26.22 kg/m   Physical Exam Vitals reviewed.  Constitutional:      Appearance: She is not ill-appearing.  HENT:     Nose: Nose normal.     Mouth/Throat:     Mouth: Mucous membranes are moist.  Eyes:     Conjunctiva/sclera: Conjunctivae normal.  Cardiovascular:     Rate and Rhythm: Normal rate and regular rhythm.     Heart sounds: No murmur heard. Pulmonary:     Effort: Pulmonary effort is normal.     Breath sounds: No stridor. No wheezing, rhonchi or rales.  Abdominal:     General: Abdomen is flat.     Palpations: There is no mass.     Tenderness: There is no abdominal tenderness. There is no guarding.     Hernia: No hernia is present.  Musculoskeletal:        General: Normal range of motion.     Cervical back: Neck supple.     Right lower leg: No edema.     Left lower leg: No edema.  Lymphadenopathy:     Cervical: No cervical adenopathy.  Skin:    General: Skin is warm and dry.     Findings: Rash present. No erythema. Rash is macular, papular and scaling. Rash is not crusting, nodular, purpuric,  pustular, urticarial or vesicular.     Comments: Proximal aspect of left lower leg there is a group of macules and papules with scale and excoriation.  See photo.  Neurological:     General: No focal deficit present.     Mental Status: She is alert.  Psychiatric:        Mood and Affect: Mood normal.        Behavior: Behavior normal.    Lab Results  Component Value Date   WBC 10.0 01/15/2021   HGB 14.4 01/15/2021   HCT 41.3 01/15/2021   PLT 216.0 01/15/2021   GLUCOSE 101 (H) 01/15/2021   CHOL 142 01/15/2021   TRIG 144.0 01/15/2021   HDL 52.10 01/15/2021   LDLCALC 62 01/15/2021   ALT 13 01/15/2021   AST 18 01/15/2021   NA 141 01/15/2021   K 3.5 01/15/2021   CL 101 01/15/2021   CREATININE 1.27 (H) 01/15/2021   BUN 12 01/15/2021   CO2 29 01/15/2021   TSH 3.84 01/15/2021   INR 0.97 08/04/2016   HGBA1C 5.3 12/23/2018     Assessment & Plan:   Raedean was seen today for hyperlipidemia.  Diagnoses and all orders for this visit:  Intrinsic eczema -     fluocinonide cream (LIDEX) 0.05 %; Apply  1 application topically 2 (two) times daily.  Hyperlipidemia with target LDL less than 130- LDL goal achieved. Doing well on the statin  -     Lipid panel; Future -     TSH; Future -     Hepatic function panel; Future -     Hepatic function panel -     TSH -     Lipid panel  Essential hypertension- Her blood pressure is adequately well controlled. -     CBC with Differential/Platelet; Future -     Basic metabolic panel; Future -     TSH; Future -     Hepatic function panel; Future -     Hepatic function panel -     TSH -     Basic metabolic panel -     CBC with Differential/Platelet  Stage 3b chronic kidney disease ([place])- Her GFR is in the 30s.  I have asked her to see nephrology to have this evaluated. -     Ambulatory referral to Nephrology   I am having Candace Cables "Pat" start on fluocinonide cream. I am also having her maintain her folic acid, Iron, aspirin EC,  CALCIUM-VITAMIN D PO, tacrolimus, Biotin, hydrOXYzine, atorvastatin, and DULoxetine.  Meds ordered this encounter  Medications   fluocinonide cream (LIDEX) 0.05 %    Sig: Apply 1 application topically 2 (two) times daily.    Dispense:  60 g    Refill:  2      Follow-up: Return in about 6 months (around 07/18/2021).   Scarlette Calico, MD

## 2021-01-17 ENCOUNTER — Other Ambulatory Visit: Payer: Self-pay | Admitting: Gastroenterology

## 2021-01-17 DIAGNOSIS — Z944 Liver transplant status: Principal | ICD-10-CM

## 2021-01-17 DIAGNOSIS — Z79899 Other long term (current) drug therapy: Principal | ICD-10-CM

## 2021-01-17 MED ORDER — PROGRAF 0.5 MG CAPSULE
ORAL_CAPSULE | 2 refills | 0 days | Status: CP
Start: 2021-01-17 — End: ?

## 2021-01-20 ENCOUNTER — Telehealth: Payer: Self-pay | Admitting: Gastroenterology

## 2021-01-20 ENCOUNTER — Encounter: Payer: Self-pay | Admitting: Gastroenterology

## 2021-01-20 ENCOUNTER — Ambulatory Visit (AMBULATORY_SURGERY_CENTER): Payer: Medicare Other | Admitting: Gastroenterology

## 2021-01-20 ENCOUNTER — Other Ambulatory Visit: Payer: Self-pay

## 2021-01-20 VITALS — BP 126/52 | HR 70 | Temp 96.6°F | Resp 13 | Ht 63.0 in | Wt 149.0 lb

## 2021-01-20 DIAGNOSIS — K5730 Diverticulosis of large intestine without perforation or abscess without bleeding: Secondary | ICD-10-CM

## 2021-01-20 DIAGNOSIS — R194 Change in bowel habit: Secondary | ICD-10-CM

## 2021-01-20 MED ORDER — SODIUM CHLORIDE 0.9 % IV SOLN: 500.0000 mL | Freq: Once | INTRAVENOUS | Status: DC

## 2021-01-20 NOTE — Progress Notes (Signed)
[place] relieves [employer]

## 2021-01-20 NOTE — Patient Instructions (Signed)
Resume previous diet and present medications. Use Citrucel one tablespoon by mouth daily. No repeat colonoscopy due to age.  YOU HAD AN ENDOSCOPIC PROCEDURE TODAY AT [facility]:   Refer to the procedure report that was given to you for any specific questions about what was found during the examination.  If the procedure report does not answer your questions, please call your gastroenterologist to clarify.  If you requested that your care partner not be given the details of your procedure findings, then the procedure report has been included in a sealed envelope for you to review at your convenience later.  YOU SHOULD EXPECT: Some feelings of bloating in the abdomen. Passage of more gas than usual.  Walking can help get rid of the air that was put into your GI tract during the procedure and reduce the bloating. If you had a lower endoscopy (such as a colonoscopy or flexible sigmoidoscopy) you may notice spotting of blood in your stool or on the toilet paper. If you underwent a bowel prep for your procedure, you may not have a normal bowel movement for a few days.  Please Note:  You might notice some irritation and congestion in your nose or some drainage.  This is from the oxygen used during your procedure.  There is no need for concern and it should clear up in a day or so.  SYMPTOMS TO REPORT IMMEDIATELY:  Following lower endoscopy (colonoscopy or flexible sigmoidoscopy):  Excessive amounts of blood in the stool  Significant tenderness or worsening of abdominal pains  Swelling of the abdomen that is new, acute  Fever of 100F or higher   For urgent or emergent issues, a gastroenterologist can be reached at any hour by calling [phone]. Do not use MyChart messaging for urgent concerns.    DIET:  We do recommend a small meal at first, but then you may proceed to your regular diet.  Drink plenty of fluids but you should avoid alcoholic beverages for 24 hours.  ACTIVITY:   You should plan to take it easy for the rest of today and you should NOT DRIVE or use heavy machinery until tomorrow (because of the sedation medicines used during the test).    FOLLOW UP: Our staff will call the number listed on your records 48-72 hours following your procedure to check on you and address any questions or concerns that you may have regarding the information given to you following your procedure. If we do not reach you, we will leave a message.  We will attempt to reach you two times.  During this call, we will ask if you have developed any symptoms of COVID 19. If you develop any symptoms (ie: fever, flu-like symptoms, shortness of breath, cough etc.) before then, please call [phone].  If you test positive for Covid 19 in the 2 weeks post procedure, please call and report this information to [place].    If any biopsies were taken you will be contacted by phone or by letter within the next 1-3 weeks.  Please call us at [phone] if you have not heard about the biopsies in 3 weeks.    SIGNATURES/CONFIDENTIALITY: You and/or your care partner have signed paperwork which will be entered into your electronic medical record.  These signatures attest to the fact that that the information above on your After Visit Summary has been reviewed and is understood.  Full responsibility of the confidentiality of this discharge information lies with you and/or your  care-partner.  

## 2021-01-20 NOTE — Telephone Encounter (Addendum)
Called patient back and she had spoke with Butch Penny, she is drinking her Golytely prep now since her prep drinking was paused due to a fire alarm. Advised by Dr. Loletha Carrow to go ahead and bump her to the last procedure giving her an additional hour to finish her prep and additional water.Her new schedule will be drinking until 12:30 and coming in at 2:30. She agreed and had no further questions.

## 2021-01-20 NOTE — Telephone Encounter (Signed)
Called back and spoke to pt.  Pt said that she had a fire alarm at her apartment complex this morning and was outside waiting to get back into her apartment until 10:00 am.   Pt said that she was unable to start the second part of her prep that should have been started at 8:30 am this morning for her scheduled colonoscopy today.  Advised pt to start second part of prep immediately and drink plenty of clear liquids and extend her drinking cut off time to 12:30 pm.  Advised pt to keep her arrival time at 1:30 pm for her 2:30 pm procedure appt with Dr. Loletha Carrow.  Pt voiced  understanding and had no further questions.

## 2021-01-20 NOTE — Progress Notes (Signed)
A/ox3, pleased with MAC, report to RN 

## 2021-01-20 NOTE — Progress Notes (Signed)
History:  This patient presents for endoscopic testing for change in bowel habits. Clinical details in office note 11/28/20 - no changes since then [facility]ace [facility] Referring physician: Janith Lima, MD  Past Medical History: Past Medical History:  Diagnosis Date   Allergic rhinitis 11/14/2012   Amblyopia of eye, left 06/17/2017   Asbestos exposure 04/08/2018   Asthma    1994-1994 due to black mold house, no asthma now   Broken ankle    right   Broken rib    x2   Cataract    Central retinal vein occlusion    left eye retinal occlusion   Chronic hepatitis C virus infection [id number]   Complication of anesthesia    IV phenergan  serious mental reaction   Depression    Essential hypertension 10/14/2017   Gastroesophageal reflux disease 10/14/2017   Hernia, femoral    left   History of blood transfusion 1971   acquired hepatitis c   History of kidney stones    History of liver transplant 11/14/2012   History of nephrolithiasis 10/14/2017   History of renal stone    History of shingles    Hyperglycemia 04/08/2018   Malignant neoplasm of liver 11/14/2012   Migraine 07/02/2015   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   Seasonal affective disorder    Splenic artery aneurysm (HCC)    Tailbone injury age 89 and age 27 and few weeks ago   broken x3   TIA (transient ischemic attack)    x3   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-will get Avastin injections off/on   Vitamin D deficiency 04/08/2018     Past Surgical History: Past Surgical History:  Procedure Laterality Date   CATARACT EXTRACTION Bilateral    CHOLECYSTECTOMY  1997   CYSTOSCOPY W/ URETERAL STENT PLACEMENT Left 08/14/2016   Procedure: CYSTOSCOPY WITH RETROGRADE PYELOGRAM/URETERAL LEFT URETEROSCOPY AND LEFT STENT PLACEMENT;  Surgeon: Raynelle Bring, MD;  Location: [facility];  Service: Urology;  Laterality: Left;   CYSTOSCOPY/URETEROSCOPY/HOLMIUM LASER/STENT PLACEMENT Left  07/06/2016   Procedure: CYSTOSCOPY/URETEROSCOPY/ BILATERAL RETROGRADE/HOLMIUM LASER/STENT PLACEMENT/BASKET STONE REMOVAL;  Surgeon: Raynelle Bring, MD;  Location: [facility];  Service: Urology;  Laterality: Left;   CYSTOSCOPY/URETEROSCOPY/HOLMIUM LASER/STENT PLACEMENT Left 08/06/2016   Procedure: CYSTOSCOPY/URETEROSCOPY/HOLMIUM LASER/STENT PLACEMENT;  Surgeon: Raynelle Bring, MD;  Location: [facility];  Service: Urology;  Laterality: Left;   CYSTOSCOPY/URETEROSCOPY/HOLMIUM LASER/STENT PLACEMENT Left 08/31/2016   Procedure: CYSTOSCOPY/URETEROSCOPY/HOLMIUM LASER/STENT PLACEMENT/BASKET STONE REMOVAL;  Surgeon: Raynelle Bring, MD;  Location: [facility];  Service: Urology;  Laterality: Left;   FEMORAL HERNIA REPAIR Left 1997   left leg-   LIVER TRANSPLANTATION     NASAL SINUS SURGERY  1997   x3   TONSILLECTOMY  age 30   TUBAL LIGATION      Allergies: Allergies  Allergen Reactions   Codeine Nausea Only   Other Other (See Comments)    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     Penicillins Nausea Only and Other (See Comments)    Has patient had a PCN reaction causing immediate rash, facial/tongue/throat swelling, SOB or lightheadedness with hypotension: No Has patient had a PCN reaction causing severe rash involving mucus membranes or skin necrosis: No Has patient had a PCN reaction that required hospitalization No Has patient had a PCN reaction occurring within the last 10 years: No If all of the above answers are "NO", then may proceed with Cephalosporin use.   Phenergan [Promethazine] Other (See Comments)  Reaction:  Hallucinations  Pt states that she is only allergic to IV form.    Sulfa Antibiotics Itching   Tamsulosin Itching   Lidocaine Palpitations    Outpatient Meds: Current Outpatient Medications  Medication Sig Dispense Refill   aspirin EC 81 MG tablet Take 81 mg by mouth daily.     atorvastatin (LIPITOR) 10 MG tablet Take one tablet by mouth once daily for cholesterol. 90  tablet 0   Biotin 800 MCG TABS Take 1 tablet by mouth.     CALCIUM-VITAMIN D PO Take 600 mg by mouth daily.     DULoxetine (CYMBALTA) 60 MG capsule Take one capsule by mouth once daily. 90 capsule 0   Ferrous Sulfate (IRON) 325 (65 Fe) MG TABS Take 1 tablet by mouth daily.     fluocinonide cream (LIDEX) [id number] % Apply 1 application topically 2 (two) times daily. 60 g 2   folic acid (FOLVITE) [id number] MCG tablet Take 400 mcg by mouth daily.     GAVILYTE-G 236 g solution TAKE 4,000 ML BY MOUTH FOR 1 DOSE 4000 mL 0   hydrOXYzine (ATARAX/VISTARIL) 25 MG tablet Take 1 tablet (25 mg total) by mouth every 6 (six) hours as needed for itching. 30 tablet 0   tacrolimus (PROGRAF) 0.5 MG capsule 6 by mouth daily NAME BRAND ONLY     Current Facility-Administered Medications  Medication Dose Route Frequency Provider Last Rate Last Admin   0.9 %  sodium chloride infusion  500 mL Intravenous Once Nelida Meuse III, MD          ___________________________________________________________________ Objective   Exam:  BP 130/83   Pulse 86   Temp (!) 96.6 F (35.9 C)   Ht '5\' 3"'$  (1.6 m)   Wt 149 lb (67.6 kg)   SpO2 99%   BMI 26.39 kg/m   CV: RRR without murmur, S1/S2, no JVD, no peripheral edema Resp: clear to auscultation bilaterally, normal RR and effort noted GI: soft, no tenderness, with active bowel sounds.   Assessment:  Change in bowel habits  Plan: Colonoscopy  The patient is appropriate for an endoscopic procedure in the ambulatory setting.  Nelida Meuse III

## 2021-01-20 NOTE — Telephone Encounter (Signed)
Patient is calling to get advise on the second half of the prep has her procedure with Dr. Loletha Carrow this afternoon

## 2021-01-20 NOTE — Op Note (Signed)
[facility] Patient Name: Candace Hill Procedure Date: 01/20/2021 3:30 PM MRN: [id number] Endoscopist: Caldwell. Loletha Carrow , MD Age: 75 Referring MD:  Date of Birth: 04-Oct-1945 Gender: Female Account #: [id number] Procedure:                Colonoscopy Indications:              Change in bowel habits                           small, pellet-like stools Medicines:                Monitored Anesthesia Care Procedure:                Pre-Anesthesia Assessment:                           - Prior to the procedure, a History and Physical                            was performed, and patient medications and                            allergies were reviewed. The patient's tolerance of                            previous anesthesia was also reviewed. The risks                            and benefits of the procedure and the sedation                            options and risks were discussed with the patient.                            All questions were answered, and informed consent                            was obtained. Prior Anticoagulants: The patient has                            taken no previous anticoagulant or antiplatelet                            agents. ASA Grade Assessment: III - A patient with                            severe systemic disease. After reviewing the risks                            and benefits, the patient was deemed in                            satisfactory condition to undergo the procedure.  After obtaining informed consent, the colonoscope                            was passed under direct vision. Throughout the                            procedure, the patient's blood pressure, pulse, and                            oxygen saturations were monitored continuously. The                            Olympus CF-[id number] ([id number]) Colonoscope was                            introduced through the anus and advanced to the the                             cecum, identified by appendiceal orifice and                            ileocecal valve. The quality of the bowel                            preparation was good. The ileocecal valve,                            appendiceal orifice, and rectum were photographed.                            The colonoscopy was somewhat difficult due to a                            redundant colon. Successful completion of the                            procedure was aided by using manual pressure. The                            patient tolerated the procedure well. The bowel                            preparation used was GoLYTELY. Scope In: 3:39:27 PM Scope Out: 3:54:45 PM Scope Withdrawal Time: 0 hours 8 minutes 53 seconds  Total Procedure Duration: 0 hours 15 minutes 18 seconds  Findings:                 The perianal and digital rectal examinations were                            normal.                           The entire examined colon appeared normal.  There is no endoscopic evidence of diverticula or                            polyps in the entire colon.                           Retroflexion in the rectum was not performed due to                            narrow anatomy.                           The entire examined colon appeared normal. Complications:            No immediate complications. Estimated Blood Loss:     Estimated blood loss: none. Impression:               - The entire examined colon is normal.                           - The entire examined colon is normal.                           - No specimens collected.                           Benign change in bowel habits/character. Recommendation:           - Patient has a contact number available for                            emergencies. The signs and symptoms of potential                            delayed complications were discussed with the                            patient. Return to normal  activities tomorrow.                            Written discharge instructions were provided to the                            patient.                           - Resume previous diet.                           - Continue present medications.                           - Use Citrucel one tablespoon PO daily.                           - No repeat routine screening colonoscopy due to  age, current guidelines and the absence of colonic                            polyps. Candace Hill L. Loletha Carrow, MD 01/20/2021 4:00:44 PM This report has been signed electronically.

## 2021-01-22 ENCOUNTER — Telehealth: Payer: Self-pay

## 2021-01-22 NOTE — Telephone Encounter (Signed)
  Follow up Call-  Call back number 01/20/2021  Post procedure Call Back phone  # [phone]  Permission to leave phone message Yes  Some recent data might be hidden     Patient questions:  Do you have a fever, pain , or abdominal swelling? No. Pain Score  0 *  Have you tolerated food without any problems? Yes.    Have you been able to return to your normal activities? Yes.    Do you have any questions about your discharge instructions: Diet   No. Medications  No. Follow up visit  No.  Do you have questions or concerns about your Care? No.  Actions: * If pain score is 4 or above: No action needed, pain <4.  Have you developed a fever since your procedure? No   2.   Have you had an respiratory symptoms (SOB or cough) since your procedure? No   3.   Have you tested positive for COVID 19 since your procedure no   4.   Have you had any family members/close contacts diagnosed with the COVID 19 since your procedure?  No    If yes to any of these questions please route to Joylene John, RN and Joella Prince, RN

## 2021-02-06 DIAGNOSIS — F411 Generalized anxiety disorder: Secondary | ICD-10-CM | POA: Diagnosis not present

## 2021-02-06 DIAGNOSIS — F331 Major depressive disorder, recurrent, moderate: Secondary | ICD-10-CM | POA: Diagnosis not present

## 2021-02-14 DIAGNOSIS — Z944 Liver transplant status: Principal | ICD-10-CM

## 2021-02-14 DIAGNOSIS — Z79899 Other long term (current) drug therapy: Principal | ICD-10-CM

## 2021-02-14 MED ORDER — PROGRAF 0.5 MG CAPSULE
ORAL_CAPSULE | 1 refills | 0 days
Start: 2021-02-14 — End: ?

## 2021-02-18 DIAGNOSIS — Z944 Liver transplant status: Principal | ICD-10-CM

## 2021-02-18 DIAGNOSIS — Z79899 Other long term (current) drug therapy: Principal | ICD-10-CM

## 2021-02-18 MED ORDER — PROGRAF 0.5 MG CAPSULE
ORAL_CAPSULE | Freq: Two times a day (BID) | ORAL | 1 refills | 0.00000 days | Status: CP
Start: 2021-02-18 — End: ?

## 2021-02-25 ENCOUNTER — Other Ambulatory Visit: Payer: Self-pay | Admitting: Nurse Practitioner

## 2021-02-25 DIAGNOSIS — F419 Anxiety disorder, unspecified: Secondary | ICD-10-CM

## 2021-02-25 DIAGNOSIS — F339 Major depressive disorder, recurrent, unspecified: Secondary | ICD-10-CM

## 2021-03-05 DIAGNOSIS — L280 Lichen simplex chronicus: Secondary | ICD-10-CM | POA: Diagnosis not present

## 2021-03-05 DIAGNOSIS — L309 Dermatitis, unspecified: Secondary | ICD-10-CM | POA: Diagnosis not present

## 2021-03-06 ENCOUNTER — Ambulatory Visit: Payer: Medicare Other | Admitting: *Deleted

## 2021-03-06 ENCOUNTER — Encounter: Payer: Self-pay | Admitting: *Deleted

## 2021-03-10 ENCOUNTER — Ambulatory Visit (INDEPENDENT_AMBULATORY_CARE_PROVIDER_SITE_OTHER): Payer: Medicare Other | Admitting: *Deleted

## 2021-03-10 ENCOUNTER — Other Ambulatory Visit: Payer: Self-pay | Admitting: Nurse Practitioner

## 2021-03-10 ENCOUNTER — Encounter: Payer: Self-pay | Admitting: *Deleted

## 2021-03-10 DIAGNOSIS — Z139 Encounter for screening, unspecified: Secondary | ICD-10-CM | POA: Diagnosis not present

## 2021-03-10 DIAGNOSIS — F32A Depression, unspecified: Secondary | ICD-10-CM

## 2021-03-10 DIAGNOSIS — Z0000 Encounter for general adult medical examination without abnormal findings: Secondary | ICD-10-CM | POA: Diagnosis not present

## 2021-03-10 DIAGNOSIS — Z79899 Other long term (current) drug therapy: Principal | ICD-10-CM

## 2021-03-10 DIAGNOSIS — Z944 Liver transplant status: Principal | ICD-10-CM

## 2021-03-10 DIAGNOSIS — Z5181 Encounter for therapeutic drug level monitoring: Principal | ICD-10-CM

## 2021-03-10 DIAGNOSIS — E612 Magnesium deficiency: Principal | ICD-10-CM

## 2021-03-10 MED ORDER — PROGRAF 0.5 MG CAPSULE
ORAL_CAPSULE | 0 refills | 0 days
Start: 2021-03-10 — End: ?

## 2021-03-10 NOTE — Patient Instructions (Signed)
Health Maintenance, Female Adopting a healthy lifestyle and getting preventive care are important in promoting health and wellness. Ask your health care provider about: The right schedule for you to have regular tests and exams. Things you can do on your own to prevent diseases and keep yourself healthy. What should I know about diet, weight, and exercise? Eat a healthy diet  Eat a diet that includes plenty of vegetables, fruits, low-fat dairy products, and lean protein. Do not eat a lot of foods that are high in solid fats, added sugars, or sodium. Maintain a healthy weight Body mass index (BMI) is used to identify weight problems. It estimates body fat based on height and weight. Your health care provider can help determine your BMI and help you achieve or maintain a healthy weight. Get regular exercise Get regular exercise. This is one of the most important things you can do for your health. Most adults should: Exercise for at least 150 minutes each week. The exercise should increase your heart rate and make you sweat (moderate-intensity exercise). Do strengthening exercises at least twice a week. This is in addition to the moderate-intensity exercise. Spend less time sitting. Even light physical activity can be beneficial. Watch cholesterol and blood lipids Have your blood tested for lipids and cholesterol at 75 years of age, then have this test every 5 years. Have your cholesterol levels checked more often if: Your lipid or cholesterol levels are high. You are older than 73 years of age. You are at high risk for heart disease. What should I know about cancer screening? Depending on your health history and family history, you may need to have cancer screening at various ages. This may include screening for: Breast cancer. Cervical cancer. Colorectal cancer. Skin cancer. Lung cancer. What should I know about heart disease, diabetes, and high blood pressure? Blood pressure and heart  disease High blood pressure causes heart disease and increases the risk of stroke. This is more likely to develop in people who have high blood pressure readings, are of African descent, or are overweight. Have your blood pressure checked: Every 3-5 years if you are 18-39 years of age. Every year if you are 40 years old or older. Diabetes Have regular diabetes screenings. This checks your fasting blood sugar level. Have the screening done: Once every three years after age 40 if you are at a normal weight and have a low risk for diabetes. More often and at a younger age if you are overweight or have a high risk for diabetes. What should I know about preventing infection? Hepatitis B If you have a higher risk for hepatitis B, you should be screened for this virus. Talk with your health care provider to find out if you are at risk for hepatitis B infection. Hepatitis C Testing is recommended for: Everyone born from 1945 through 1965. Anyone with known risk factors for hepatitis C. Sexually transmitted infections (STIs) Get screened for STIs, including gonorrhea and chlamydia, if: You are sexually active and are younger than 73 years of age. You are older than 73 years of age and your health care provider tells you that you are at risk for this type of infection. Your sexual activity has changed since you were last screened, and you are at increased risk for chlamydia or gonorrhea. Ask your health care provider if you are at risk. Ask your health care provider about whether you are at high risk for HIV. Your health care provider may recommend a prescription medicine   to help prevent HIV infection. If you choose to take medicine to prevent HIV, you should first get tested for HIV. You should then be tested every 3 months for as long as you are taking the medicine. Pregnancy If you are about to stop having your period (premenopausal) and you may become pregnant, seek counseling before you get  pregnant. Take 400 to 800 micrograms (mcg) of folic acid every day if you become pregnant. Ask for birth control (contraception) if you want to prevent pregnancy. Osteoporosis and menopause Osteoporosis is a disease in which the bones lose minerals and strength with aging. This can result in bone fractures. If you are 65 years old or older, or if you are at risk for osteoporosis and fractures, ask your health care provider if you should: Be screened for bone loss. Take a calcium or vitamin D supplement to lower your risk of fractures. Be given hormone replacement therapy (HRT) to treat symptoms of menopause. Follow these instructions at home: Lifestyle Do not use any products that contain nicotine or tobacco, such as cigarettes, e-cigarettes, and chewing tobacco. If you need help quitting, ask your health care provider. Do not use street drugs. Do not share needles. Ask your health care provider for help if you need support or information about quitting drugs. Alcohol use Do not drink alcohol if: Your health care provider tells you not to drink. You are pregnant, may be pregnant, or are planning to become pregnant. If you drink alcohol: Limit how much you use to 0-1 drink a day. Limit intake if you are breastfeeding. Be aware of how much alcohol is in your drink. In the [place] one drink equals one 12 oz bottle of beer (355 mL), one 5 oz glass of wine (148 mL), or one 1 oz glass of hard liquor (44 mL). General instructions Schedule regular health, dental, and eye exams. Stay current with your vaccines. Tell your health care provider if: You often feel depressed. You have ever been abused or do not feel safe at home. Summary Adopting a healthy lifestyle and getting preventive care are important in promoting health and wellness. Follow your health care provider's instructions about healthy diet, exercising, and getting tested or screened for diseases. Follow your health care provider's  instructions on monitoring your cholesterol and blood pressure. This information is not intended to replace advice given to you by your health care provider. Make sure you discuss any questions you have with your health care provider. Document Revised: 08/09/2020 Document Reviewed: 05/25/2018 Elsevier Patient Education  2022 [employer]  

## 2021-03-10 NOTE — Progress Notes (Signed)
Subjective:   Candace Hill is a 75 y.o. female who presents for Medicare Annual (Subsequent) preventive examination.  I connected with  Candace Hill on 03/10/21 by audio enabled telemedicine application and verified that I am speaking with the correct person using two identifiers.   I discussed the limitations of evaluation and management by telemedicine. The patient expressed understanding and agreed to proceed.   Location of Patient: Home Location of Provider: Office Persons participating in visit: Candace Hill (patient) & Jari Favre, CMA   Review of Systems     Defer to PCP Cardiac Risk Factors include: none     Objective:    There were no vitals filed for this visit. There is no height or weight on file to calculate BMI.  Advanced Directives 03/10/2021 09/18/2020 09/13/2020 04/16/2020 02/22/2020 01/02/2020 12/20/2019  Does Patient Have a Medical Advance Directive? No No No Yes Yes Yes Yes  Type of Advance Directive - - - Living will;Healthcare Power of [place];Living will Healthcare Power of [place];Living will  Does patient want to make changes to medical advance directive? - No - Patient declined No - Patient declined No - Patient declined No - Patient declined No - Patient declined -  Copy of [place] in Chart? - - - No - copy requested No - copy requested No - copy requested -  Would patient like information on creating a medical advance directive? No - Patient declined - - - - - -  Pre-existing out of facility DNR order (yellow form or pink MOST form) - - - - - - -    Current Medications (verified) Outpatient Encounter Medications as of 03/10/2021  Medication Sig   aspirin EC 81 MG tablet Take 81 mg by mouth daily.   atorvastatin (LIPITOR) 10 MG tablet Take one tablet by mouth once daily for cholesterol.   Biotin 800 MCG TABS Take 1 tablet by mouth.   CALCIUM-VITAMIN D PO Take 600 mg by mouth daily.    DULoxetine (CYMBALTA) 60 MG capsule Take one capsule by mouth once daily.   Ferrous Sulfate (IRON) 325 (65 Fe) MG TABS Take 1 tablet by mouth daily.   fluocinonide cream (LIDEX) 5.78 % Apply 1 application topically 2 (two) times daily.   folic acid (FOLVITE) 469 MCG tablet Take 400 mcg by mouth daily.   GAVILYTE-G 236 g solution TAKE 4,000 ML BY MOUTH FOR 1 DOSE   hydrOXYzine (ATARAX/VISTARIL) 25 MG tablet Take 1 tablet (25 mg total) by mouth every 6 (six) hours as needed for itching.   tacrolimus (PROGRAF) 0.5 MG capsule 6 by mouth daily NAME BRAND ONLY   Facility-Administered Encounter Medications as of 03/10/2021  Medication   0.9 %  sodium chloride infusion    Allergies (verified) Codeine, Other, Penicillins, Phenergan [promethazine], Sulfa antibiotics, Tamsulosin, and Lidocaine   History: Past Medical History:  Diagnosis Date   Allergic rhinitis 11/14/2012   Amblyopia of eye, left 06/17/2017   Asbestos exposure 04/08/2018   Asthma    1994-1994 due to black mold house, no asthma now   Broken ankle    right   Broken rib    x2   Cataract    Central retinal vein occlusion    left eye retinal occlusion   Chronic hepatitis C virus infection 62/95/2841   Complication of anesthesia    IV phenergan  serious mental reaction   Depression    Essential hypertension 10/14/2017   Gastroesophageal reflux  disease 10/14/2017   Hernia, femoral    left   History of blood transfusion 1971   acquired hepatitis c   History of kidney stones    History of liver transplant 11/14/2012   History of nephrolithiasis 10/14/2017   History of renal stone    History of shingles    Hyperglycemia 04/08/2018   Malignant neoplasm of liver 11/14/2012   Migraine 07/02/2015   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   Seasonal affective disorder    Splenic artery aneurysm (HCC)    Tailbone injury age 64 and age 40 and few weeks ago   broken x3   TIA (transient ischemic attack)    x3    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-will get Avastin injections off/on   Vitamin D deficiency 04/08/2018   Past Surgical History:  Procedure Laterality Date   CATARACT EXTRACTION Bilateral    CHOLECYSTECTOMY  1997   CYSTOSCOPY W/ URETERAL STENT PLACEMENT Left 08/14/2016   Procedure: CYSTOSCOPY WITH RETROGRADE PYELOGRAM/URETERAL LEFT URETEROSCOPY AND LEFT STENT PLACEMENT;  Surgeon: Raynelle Bring, MD;  Location: [facility];  Service: Urology;  Laterality: Left;   CYSTOSCOPY/URETEROSCOPY/HOLMIUM LASER/STENT PLACEMENT Left 07/06/2016   Procedure: CYSTOSCOPY/URETEROSCOPY/ BILATERAL RETROGRADE/HOLMIUM LASER/STENT PLACEMENT/BASKET STONE REMOVAL;  Surgeon: Raynelle Bring, MD;  Location: [facility];  Service: Urology;  Laterality: Left;   CYSTOSCOPY/URETEROSCOPY/HOLMIUM LASER/STENT PLACEMENT Left 08/06/2016   Procedure: CYSTOSCOPY/URETEROSCOPY/HOLMIUM LASER/STENT PLACEMENT;  Surgeon: Raynelle Bring, MD;  Location: [facility];  Service: Urology;  Laterality: Left;   CYSTOSCOPY/URETEROSCOPY/HOLMIUM LASER/STENT PLACEMENT Left 08/31/2016   Procedure: CYSTOSCOPY/URETEROSCOPY/HOLMIUM LASER/STENT PLACEMENT/BASKET STONE REMOVAL;  Surgeon: Raynelle Bring, MD;  Location: [facility];  Service: Urology;  Laterality: Left;   FEMORAL HERNIA REPAIR Left 1997   left leg-   LIVER TRANSPLANTATION     NASAL SINUS SURGERY  1997   x3   TONSILLECTOMY  age 37   TUBAL LIGATION     Family History  Adopted: Yes  Problem Relation Age of Onset   Stroke Mother    Alzheimer's disease Mother    Cancer Mother    Diabetes Mother        Questionable, per new patient packet    Arthritis Mother        Questionable, per new patient packet    Alcoholism Father    Suicidality Sister    Depression Sister    COPD Sister    COPD Brother        Heavy smoker, per new patient packet    OCD Daughter    Personality disorder Daughter    Alcoholism Son    Bipolar disorder Granddaughter        Age 44 as of 07/10/2020    Schizophrenia Granddaughter    Social History   Socioeconomic History   Marital status: Divorced    Spouse name: Not on file   Number of children: 2   Years of education: 14   Highest education level: Associate degree: occupational, Hotel manager, or vocational program  Occupational History   Occupation: Disabled  Tobacco Use   Smoking status: Never   Smokeless tobacco: Never  Vaping Use   Vaping Use: Never used  Substance and Sexual Activity   Alcohol use: No   Drug use: No   Sexual activity: Never    Comment: 14 years ago  Other Topics Concern   Not on file  Social History Narrative   Lives at alone alone.   Right-handed.   No caffeine use.  PSC- as of 10/12/17   Diet: No Fried Foods       Caffeine: Summer time, sweet tea       Married, if yes what year: Divorced       Do you live in a house, apartment, assisted living, condo, trailer, ect: [place], 1 stories, and 1 person       Pets: 1 dog       Current/Past profession: Retail buyer       Exercise: Some, walking dog       Living Will: yes   DNR: yes   POA/HPOA: yes      Functional Status: Patient did not answer   Do you have difficulty bathing or dressing yourself?   Do you have difficulty preparing food or eating?   Do you have difficulty managing your medications?   Do you have difficulty managing your finances?   Do you have difficulty affording your medications?   Social Determinants of Health   Financial Resource Strain: Low Risk    Difficulty of Paying Living Expenses: Not very hard  Food Insecurity: No Food Insecurity   Worried About Charity fundraiser in the Last Year: Never true   Ran Out of Food in the Last Year: Never true  Transportation Needs: No Transportation Needs   Lack of Transportation (Medical): No   Lack of Transportation (Non-Medical): No  Physical Activity: Insufficiently Active   Days of Exercise per Week: 1 day   Minutes of Exercise per Session: 10 min  Stress:  Stress Concern Present   Feeling of Stress : Very much  Social Connections: Socially Isolated   Frequency of Communication with Friends and Family: Never   Frequency of Social Gatherings with Friends and Family: Never   Attends Religious Services: Never   Marine scientist or Organizations: No   Attends Music therapist: Never   Marital Status: Divorced    Tobacco Counseling Counseling given: Not Answered   Clinical Intake:     Pain : No/denies pain     BMI - recorded: 26.22 Nutritional Status: BMI of 19-24  Normal Diabetes: No  How often do you need to have someone help you when you read instructions, pamphlets, or other written materials from your doctor or pharmacy?: 1 - Never  Diabetic? No  Interpreter Needed?: No      Activities of Daily Living In your present state of health, do you have any difficulty performing the following activities: 03/10/2021 01/15/2021  Hearing? N N  Vision? Y N  Difficulty concentrating or making decisions? Y N  Walking or climbing stairs? N N  Dressing or bathing? N N  Doing errands, shopping? N N  Preparing Food and eating ? N -  Using the Toilet? N -  In the past six months, have you accidently leaked urine? N -  Do you have problems with loss of bowel control? N -  Managing your Medications? N -  Managing your Finances? N -  Housekeeping or managing your Housekeeping? N -  Some recent data might be hidden    Patient Care Team: Janith Lima, MD as PCP - General (Internal Medicine) Rolm Bookbinder, MD as Consulting Physician (Dermatology) Gerarda Fraction, MD as Referring Physician (Ophthalmology) Raynelle Bring, MD as Consulting Physician (Urology) Loletha Carrow Kirke Corin, MD as Consulting Physician (Gastroenterology)  Indicate any recent Medical Services you may have received from other than [facility] providers in the past year (date may be approximate).  Assessment:   This is a routine wellness examination for  Candace Hill.  Hearing/Vision screen No results found.  Dietary issues and exercise activities discussed: Current Exercise Habits: Home exercise routine, Type of exercise: walking, Time (Minutes): 10, Frequency (Times/Week): 2, Weekly Exercise (Minutes/Week): 20, Intensity: Mild, Exercise limited by: None identified   Goals Addressed   None    Depression Screen PHQ 2/9 Scores 03/10/2021 01/15/2021 02/22/2020 01/02/2020 12/23/2018 12/23/2018 11/14/2018  PHQ - 2 Score 6 0 - 0 0 0 -  PHQ- 9 Score 14 - - - - - -  Exception Documentation - - Other- indicate reason in comment box - - - Other- indicate reason in comment box  Not completed - - - - - - Was under the care of a counslor.     Fall Risk Fall Risk  03/10/2021 09/18/2020 09/13/2020 07/10/2020 04/16/2020  Falls in the past year? 0 0 0 0 0  Number falls in past yr: 0 0 0 0 0  Injury with Fall? 0 0 0 0 0  Comment - - - - -  Risk for fall due to : - - - - -  Risk for fall due to: Comment - - - - -  Follow up - - - - -    Candace Hill:  Any stairs in or around the home? Yes  If so, are there any without handrails? No  Home free of loose throw rugs in walkways, pet beds, electrical cords, etc? Yes  Adequate lighting in your home to reduce risk of falls? Yes   ASSISTIVE DEVICES UTILIZED TO PREVENT FALLS:  Life alert? No  Use of a cane, walker or w/c? No  Grab bars in the bathroom? Yes  Shower chair or bench in shower? No  Elevated toilet seat or a handicapped toilet? No  TIMED UP AND GO:  Was the test performed?  n/a .  Length of time to ambulate 10 feet: n/a sec.    Cognitive Function: MMSE - Mini Mental State Exam 12/23/2018 12/20/2017  Orientation to time 4 4  Orientation to Place 4 4  Registration 3 3  Attention/ Calculation 5 5  Recall 3 3  Language- name 2 objects 2 2  Language- repeat 1 1  Language- follow 3 step command 3 3  Language- read & follow direction 1 1  Write a sentence 1 1  Copy  design 1 1  Total score 28 28     6CIT Screen 03/10/2021 01/02/2020  What Year? 0 points 0 points  What month? 0 points 0 points  What time? 0 points 0 points  Count back from 20 0 points 0 points  Months in reverse 0 points 0 points  Repeat phrase 10 points 2 points  Total Score 10 2    Immunizations Immunization History  Administered Date(s) Administered   Fluad Quad(high Dose 65+) 02/15/2019   Influenza, Seasonal, Injecte, Preservative Fre 03/18/2009   Influenza,inj,Quad PF,6+ Mos 04/08/2018, 07/10/2020   Influenza-Unspecified 03/16/2017   Moderna Sars-Covid-2 Vaccination 06/14/2019, 07/12/2019   Pneumococcal Conjugate-13 07/12/2018   Pneumococcal Polysaccharide-23 07/10/2020    TDAP status: Due, Education has been provided regarding the importance of this vaccine. Advised may receive this vaccine at local pharmacy or Health Dept. Aware to provide a copy of the vaccination record if obtained from local pharmacy or Health Dept. Verbalized acceptance and understanding.  Flu Vaccine status: Due, Education has been provided regarding the importance of this vaccine. Advised may receive  this vaccine at local pharmacy or Health Dept. Aware to provide a copy of the vaccination record if obtained from local pharmacy or Health Dept. Verbalized acceptance and understanding.  Pneumococcal vaccine status: Due, Education has been provided regarding the importance of this vaccine. Advised may receive this vaccine at local pharmacy or Health Dept. Aware to provide a copy of the vaccination record if obtained from local pharmacy or Health Dept. Verbalized acceptance and understanding.  Covid-19 vaccine status: Information provided on how to obtain vaccines.   Qualifies for Shingles Vaccine? Yes   Zostavax completed No   Shingrix Completed?: No.    Education has been provided regarding the importance of this vaccine. Patient has been advised to call insurance company to determine out of pocket  expense if they have not yet received this vaccine. Advised may also receive vaccine at local pharmacy or Health Dept. Verbalized acceptance and understanding.  Screening Tests Health Maintenance  Topic Date Due   TETANUS/TDAP  Never done   Zoster Vaccines- Shingrix (1 of 2) Never done   COVID-19 Vaccine (3 - Moderna risk series) 03/26/2021 (Originally 08/09/2019)   INFLUENZA VACCINE  09/12/2021 (Originally 01/13/2021)   MAMMOGRAM  04/11/2022   DEXA SCAN  Completed   Hepatitis C Screening  Completed   HPV VACCINES  Aged Out    Health Maintenance  Health Maintenance Due  Topic Date Due   TETANUS/TDAP  Never done   Zoster Vaccines- Shingrix (1 of 2) Never done    Colorectal cancer screening: No longer required.   Mammogram status: Completed 04/11/2020. Repeat every year  Bone Density status: Completed 08/05/2005. Results reflect: Bone density results: OSTEOPENIA. Repeat every 2 years.  Lung Cancer Screening: (Low Dose CT Chest recommended if Age 21-80 years, 30 pack-year currently smoking OR have quit w/in 15years.) does not qualify.    Additional Screening:  Hepatitis C Screening: does qualify; Completed   Vision Screening: Recommended annual ophthalmology exams for early detection of glaucoma and other disorders of the eye. Is the patient up to date with their annual eye exam?   n/a Who is the provider or what is the name of the office in which the patient attends annual eye exams? N/a If pt is not established with a provider, would they like to be referred to a provider to establish care?  N/a .   Dental Screening: Recommended annual dental exams for proper oral hygiene  Community Resource Referral / Chronic Care Management: CRR required this visit?  Yes   CCM required this visit?  No      Plan:     I have personally reviewed and noted the following in the patient's chart:   Medical and social history Use of alcohol, tobacco or illicit drugs  Current medications  and supplements including opioid prescriptions.  Functional ability and status Nutritional status Physical activity Advanced directives List of other physicians Hospitalizations, surgeries, and ER visits in previous 12 months Vitals Screenings to include cognitive, depression, and falls Referrals and appointments  In addition, I have reviewed and discussed with patient certain preventive protocols, quality metrics, and best practice recommendations. A written personalized care plan for preventive services as well as general preventive health recommendations were provided to patient.     Candace Hill, [place]   03/10/2021   Nurse Notes: 31 minutes non face to face   Ms. Candace Hill , Thank you for taking time to come for your Medicare Wellness Visit. I appreciate your ongoing commitment to your health goals. Please  review the following plan we discussed and let me know if I can assist you in the future.   These are the goals we discussed:  Goals      Patient Stated     Would like to start singing again        This is a list of the screening recommended for you and due dates:  Health Maintenance  Topic Date Due   Tetanus Vaccine  Never done   Zoster (Shingles) Vaccine (1 of 2) Never done   COVID-19 Vaccine (3 - Moderna risk series) 03/26/2021*   Flu Shot  09/12/2021*   Mammogram  04/11/2022   DEXA scan (bone density measurement)  Completed   Hepatitis C Screening: USPSTF Recommendation to screen - Ages 52-79 yo.  Completed   HPV Vaccine  Aged Out  *Topic was postponed. The date shown is not the original due date.

## 2021-03-10 NOTE — Unmapped (Addendum)
Attempted to contact patient to complete TRF. Left VM requesting return call to [facility]. Patient contacted [facility] this afternoon. Explained that refill request was sent to/received by her pharmacy on 9/6 and should be available for pickup now. Instructed her to repeat labs, since no recent liver txp labs with a tac level were done since last year. She reported she has not been seen by [facility] for labs, because they are not truthful with her and that she did not trust going to [facility] for labs, but inquired if she could go to [facility]. Let her know that that was a reliable lab, and [facility] would send over orders. Reviewed method for obtaining a reliable tac level and confirmed patient's was aware of correct Prograf prescription, but patient said she was still taking the entire dose of 3mg  once daily at night b/c it makes her feel bad. On exploration, she reported she believes it makes her feel more depressed to take 1.5mg  in am and then again at pm. Explained to her again, the risk of rejection since the Prograf only suppresses her immune system for a 12 hr interval and is possibly not suppressed for 12 hrs of each day, as well as the increased toxicity to the kidneys to get a double dose each day. Reinforced the importance of her choosing a time to take in the morning and then repeating the 1.5mg  dose 12 hrs later, then getting her tacrolimus trough draw at the AM dosing time BEFORE she takes her meds. She verbalized understanding and agreed to adjust her dosing schedule. Then she said that she only has 2 days of Tac remaining and was told by the pharmacy there was an issue with her prescription. Reiterated that a new script was sent on 9/6, and she should be able to pick it up at the pharmacy now. She verbalized understanding and agreed to pickup refill and repeat labs.    Noted early this evening that another prograf prescription request was sent by her pharmacy with notation that patient's Good Rx card was no longer covering her for a $0 copay and it would now be $699. Contacted the pharmacy and was told the patient picked up her prograf Rx earlier in the month and is now trying to refill it too early, claiming she only has 2 more days of medication left. It is unclear what has actually happened to the patient's supply. Will contact her next business day to discuss what happened to the additional medication and plan for obtaining coverage until she is allowed a refill.

## 2021-03-11 DIAGNOSIS — Z79899 Other long term (current) drug therapy: Principal | ICD-10-CM

## 2021-03-11 DIAGNOSIS — Z944 Liver transplant status: Principal | ICD-10-CM

## 2021-03-11 MED ORDER — TACROLIMUS 0.5 MG CAPSULE, IMMEDIATE-RELEASE
ORAL_CAPSULE | Freq: Two times a day (BID) | ORAL | 2 refills | 30 days | Status: CP
Start: 2021-03-11 — End: ?
  Filled 2021-03-12: qty 180, 30d supply, fill #0

## 2021-03-11 NOTE — Unmapped (Signed)
[facility]   Patient Onboarding/Medication Counseling    Kristy Hanson is a 75 y.o. female with *** who I am counseling today on {Blank:19197::initiation,continuation} of therapy.  I am speaking to {Blank:19197::the patient,the patient's caregiver, ***,the patient's family member, ***,***}.    Was a Nurse, learning disability used for this call? {Blank single:19197::Yes, ***. Patient language is appropriate in WAM,No}    Verified patient's date of birth / HIPAA.    Specialty medication(s) to be sent: {specpharm:59087}      Non-specialty medications/supplies to be sent: ***      Medications not needed at this time: ***         ***    Current Medications (including OTC/herbals), Comorbidities and Allergies     Current Outpatient Medications   Medication Sig Dispense Refill   ??? ALPHAGAN P 0.1 % Drop Administer 1 drop to both eyes Two (2) times a day.     ??? aspirin (ECOTRIN) 81 MG tablet Take 81 mg by mouth daily.     ??? calcium carbonate 300 mg (750 mg) Chew Chew.     ??? cholecalciferol, vitamin D3, (VITAMIN D3) 1,000 unit capsule Take 1,000 Units by mouth daily.     ??? DULoxetine (CYMBALTA) 60 MG capsule Take 60 mg by mouth daily.     ??? ferrous sulfate 325 (65 FE) MG tablet Take 325 mg by mouth daily with breakfast.     ??? folic acid (FOLVITE) 1 MG tablet Take 400 mcg by mouth daily.      ??? ledipasvir 90 mg-sofosbuvir 400 mg (HARVONI) tablet Take 1 tablet by mouth daily. (Patient not taking: Reported on 03/30/2017) 28 tablet 2   ??? potassium citrate (UROCIT-K 15) 15 mEq TbER Take 2 tablets by mouth Two (2) times a day.      ??? propranolol (INNOPRAN XL) 80 MG 24 hr capsule Take 80 mg by mouth nightly.     ??? pyridoxine (B-6) 100 MG tablet Take 100 mg by mouth daily.     ??? rizatriptan (MAXALT-MLT) 5 MG disintegrating tablet Take 5 mg by mouth once as needed for migraine. May repeat in 2 hours if needed     ??? tacrolimus (PROGRAF) 0.5 MG capsule Take 3 capsules (1.5 mg total) by mouth two (2) times a day. 180 capsule 2   ??? traMADol (ULTRAM) 50 mg tablet Take 50 mg by mouth every six (6) hours as needed for pain.       No current facility-administered medications for this visit.       Allergies   Allergen Reactions   ??? Avastin [Bevacizumab] Other (See Comments)     Mini stroke   ??? Prednisone Other (See Comments)     Patient was told by her ophthalmologist that the vision loss she had in one of her eyes was likely a result of high dose steroids. She also reports insomnia/jitters when taking it.   ??? Amoxicillin-Pot Clavulanate      Other reaction(s): NAUSEA   ??? Codeine      Other reaction(s): NAUSEA   ??? Penicillins      Other reaction(s): UNKNOWN  * Pt would like to know why she shouldn't take this*   ??? Procaine      Other reaction(s): dizziness   ??? Promethazine      oversedation   ??? Sulfa (Sulfonamide Antibiotics)      Other reaction(s): ITCHING   ??? Lidocaine Palpitations   ??? Tamsulosin Itching       Patient Active  Problem List   Diagnosis   ??? Chronic hepatitis C virus infection (CMS-HCC)       Reviewed and up to date in Epic.    Appropriateness of Therapy     Acute infections noted within Epic:  No active infections  Patient reported infection: {Blank single:19197::None,***- patient reported to provider,***- pharmacy reported to provider}    Is medication and dose appropriate based on diagnosis and infection status? {Blank single:19197::Yes,No - evidence provided by prescriber in *** note}    Prescription has been clinically reviewed: {Blank single:19197::Yes,***}      Baseline Quality of Life Assessment      {DiseaseSpecificQOL:73897}    Financial Information     Medication Assistance provided: {sscmedassist:65303}    Anticipated copay of $*** reviewed with patient. Verified delivery address.    Delivery Information     Scheduled delivery date: ***    Expected start date: ***    Medication will be delivered via {Blank:19197::UPS,Next Day Courier,Same Day Courier,Clinic Courier - *** clinic,***} to the {Blank:19197::prescription,temporary} address in Epic WAM.  This shipment {Blank single:19197::will,will not} require a signature.      Explained the services we provide at [facility] and that each month we would call to set up refills.  Stressed importance of returning phone calls so that we could ensure they receive their medications in time each month.  Informed patient that we should be setting up refills 7-10 days prior to when they will run out of medication.  A pharmacist will reach out to perform a clinical assessment periodically.  Informed patient that a welcome packet, containing information about our pharmacy and other support services, a Notice of Privacy Practices, and a drug information handout will be sent.      The patient or caregiver noted above participated in the development of this care plan and knows that they can request review of or adjustments to the care plan at any time.      Patient or caregiver verbalized understanding of the above information as well as how to contact the pharmacy at [phone] option 4 with any questions/concerns.  The pharmacy is open Monday through Friday 8:30am-4:30pm.  A pharmacist is available 24/7 via pager to answer any clinical questions they may have.    Patient Specific Needs     - Does the patient have any physical, cognitive, or cultural barriers? {Blank single:19197::No,Yes - ***}    - Does the patient have adequate living arrangements? (i.e. the ability to store and take their medication appropriately) {Blank single:19197::Yes,No - ***}    - Did you identify any home environmental safety or security hazards? {Blank single:19197::No,Yes - ***}    - Patient prefers to have medications discussed with  {Blank single:19197::Patient,Family Member,Caregiver,Other}     - Is the patient or caregiver able to read and understand education materials at a high school level or above? {Blank single:19197::No,Yes}    - Patient's primary language is  {Blank single:19197::English,Spanish,***}     - Is the patient high risk? {sschighriskpts:78327}    - Does the patient require physician intervention or other additional services (i.e. dietary/nutrition, smoking cessation, social work)? {Blank single:19197::No,Yes - ***}      Tera Helper  [facility] Specialty Pharmacist pharmacy at [phone] option 4 with any questions/concerns.  The pharmacy is open Monday through Friday 8:30am-4:30pm.  A pharmacist is available 24/7 via pager to answer any clinical questions they may have.    Patient Specific Needs     -  Does the patient have any physical, cognitive, or cultural barriers? No    - Does the patient have adequate living arrangements? (i.e. the ability to store and take their medication appropriately) Yes    - Did you identify any home environmental safety or security hazards? No    - Patient prefers to have medications discussed with  Patient     - Is the patient or caregiver able to read and understand education materials at a high school level or above? Yes    - Patient's primary language is  English     - Is the patient high risk? No    - Does the patient require physician intervention or other additional services (i.e. dietary/nutrition, smoking cessation, social work)? No      Tera Helper  [facility] Specialty Pharmacist

## 2021-03-11 NOTE — Unmapped (Signed)
[facility] Specialty Medication Onboarding    Specialty Medication: Tacrolimus 0.5mg  capsule  Prior Authorization: Not Required   Financial Assistance: No - copay  <$25  Final Copay/Day Supply: $0 / 30 days    Insurance Restrictions: Yes - max 1 month supply     Notes to Pharmacist: N/A    The triage team has completed the benefits investigation and has determined that the patient is able to fill this medication at [facility]. Please contact the patient to complete the onboarding or follow up with the prescribing physician as needed.

## 2021-03-12 MED ORDER — TACROLIMUS 0.5 MG CAPSULE, IMMEDIATE-RELEASE
ORAL_CAPSULE | 0 refills | 0 days
Start: 2021-03-12 — End: ?

## 2021-03-14 LAB — BASIC METABOLIC PANEL
BLOOD UREA NITROGEN: 12 mg/dL
CALCIUM: 9.5 mg/dL
CHLORIDE: 101 mmol/L
CO2: 29 mmol/L
CREATININE: 1.27 mg/dL
EGFR CKD-EPI NON-AA MALE: 42 mL/min/{1.73_m2} — AB
GLUCOSE RANDOM: 101 mg/dL — AB
POTASSIUM: 3.5 mmol/L
SODIUM: 141 mmol/L

## 2021-03-14 LAB — LIPID PANEL
CHOLESTEROL: 142 mg/dL
HDL CHOLESTEROL: 52 mg/dL
LDL CHOLESTEROL CALCULATED: 62 mg/dL
TRIGLYCERIDES: 144 mg/dL

## 2021-03-14 LAB — CBC W/ DIFFERENTIAL
BASOPHILS ABSOLUTE COUNT: 0.1 10*9/L
EOSINOPHILS ABSOLUTE COUNT: 0.2 10*9/L
HEMATOCRIT: 41.3 %
HEMOGLOBIN: 14.4 g/dL
LYMPHOCYTES ABSOLUTE COUNT: 2.1 10*9/L
MONOCYTES ABSOLUTE COUNT: 0.7 10*9/L
NEUTROPHILS ABSOLUTE COUNT: 6.9 10*9/L
PLATELET COUNT: 216 10*9/L
WHITE BLOOD CELL COUNT: 10 10*9/L

## 2021-03-14 LAB — HEPATIC FUNCTION PANEL
ALKALINE PHOSPHATASE: 60 U/L
ALT (SGPT): 13 U/L
AST (SGOT): 18 U/L
BILIRUBIN DIRECT: 0.1 mg/dL
BILIRUBIN TOTAL: 0.6 mg/dL
PROTEIN TOTAL: 7.5 g/dL

## 2021-03-14 LAB — TSH: THYROID STIMULATING HORMONE: 3.84 u[IU]/mL

## 2021-03-14 LAB — ALBUMIN: ALBUMIN: 4.5 g/dL

## 2021-03-14 NOTE — Unmapped (Signed)
Patient called nephrology dept in attempt to reach [facility] to verify receipt of tac medication yesterday. Again requested patient have labs drawn in the next week and reviewed method to obtain a valid trough.She verified understanding.  Provided [facility]'s contact number.

## 2021-03-14 NOTE — Unmapped (Signed)
[name] paged and will call patient

## 2021-03-17 ENCOUNTER — Telehealth: Payer: Self-pay | Admitting: Internal Medicine

## 2021-03-17 DIAGNOSIS — Z5181 Encounter for therapeutic drug level monitoring: Principal | ICD-10-CM

## 2021-03-17 DIAGNOSIS — Z944 Liver transplant status: Principal | ICD-10-CM

## 2021-03-17 DIAGNOSIS — E612 Magnesium deficiency: Principal | ICD-10-CM

## 2021-03-17 NOTE — Telephone Encounter (Signed)
[facility] FYI 9.30.22 ...   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 She was seen recently by Dr. Ronnald Ramp and got a call this morning from [facility]. Patient states she was knew Dr. Ronnald Ramp recommended her to go to nephrologist. She does not understand why it was recommended by him. Stated she was unaware of any kidney problems. Has been seen for stones in the past. Needs clarification on reasoning for the referral to nephrologist.   Requesting callback.

## 2021-03-18 NOTE — Telephone Encounter (Signed)
I was able to speak with pt and inform her of Dr. Ronnald Ramp sending in a referral for Kidney specialist.

## 2021-03-18 NOTE — Telephone Encounter (Signed)
Called pt, LVM to discuss.    Referral placed by PCP is for kidney disease  Per recent 8/3 lab notes: "Your kidney function has declined some.  I have ordered a referral for you to see a kidney specialist.  The other labs are all okay."  GFR 42.03 (ref range is over 60)

## 2021-03-20 ENCOUNTER — Telehealth: Payer: Self-pay | Admitting: *Deleted

## 2021-03-20 ENCOUNTER — Encounter: Payer: Self-pay | Admitting: *Deleted

## 2021-03-20 NOTE — Chronic Care Management (AMB) (Signed)
  Chronic Care Management   Outreach Note  03/20/2021 Name: Candace Hill MRN: [id number] DOB: [birth year]  Candace Hill is a 75 y.o. year old female who is a primary care patient of Janith Lima, MD. I reached out to Candace Hill by phone today in response to a referral sent by Candace Hill primary care provider.  An unsuccessful telephone outreach was attempted today. The patient was referred to the case management team for assistance with care management and care coordination.   Follow Up Plan: A HIPAA compliant phone message was left for the patient providing contact information and requesting a return call. The care management team will reach out to the patient again over the next 7 days.  If patient returns call to provider office, please advise to call [facility] at [phone].  [facility]  Direct Dial: [phone]

## 2021-03-24 DIAGNOSIS — Z944 Liver transplant status: Principal | ICD-10-CM

## 2021-03-24 DIAGNOSIS — Z5181 Encounter for therapeutic drug level monitoring: Principal | ICD-10-CM

## 2021-03-24 DIAGNOSIS — E612 Magnesium deficiency: Principal | ICD-10-CM

## 2021-03-26 NOTE — Chronic Care Management (AMB) (Signed)
  Chronic Care Management   Note  03/26/2021 Name: Candace Hill MRN: [id number] DOB: August 28, 1945  Candace Hill is a 76 y.o. year old female who is a primary care patient of Janith Lima, MD. I reached out to Candace Hill by phone today in response to a referral sent by Candace Hill PCP.  Candace Hill was given information about Chronic Care Management services today including:  CCM service includes personalized support from designated clinical staff supervised by her physician, including individualized plan of care and coordination with other care providers 24/7 contact phone numbers for assistance for urgent and routine care needs. Service will only be billed when office clinical staff spend 20 minutes or more in a month to coordinate care. Only one practitioner may furnish and bill the service in a calendar month. The patient may stop CCM services at any time (effective at the end of the month) by phone call to the office staff. The patient is responsible for co-pay (up to 20% after annual deductible is met) if co-pay is required by the individual health plan.   Patient agreed to services and verbal consent obtained.   Follow up plan: Telephone appointment with care management team member scheduled for:04/07/21  [facility]  Direct Dial: [phone]

## 2021-03-31 DIAGNOSIS — Z944 Liver transplant status: Principal | ICD-10-CM

## 2021-03-31 DIAGNOSIS — Z5181 Encounter for therapeutic drug level monitoring: Principal | ICD-10-CM

## 2021-03-31 DIAGNOSIS — E612 Magnesium deficiency: Principal | ICD-10-CM

## 2021-04-01 ENCOUNTER — Other Ambulatory Visit: Payer: Self-pay | Admitting: Internal Medicine

## 2021-04-02 DIAGNOSIS — Z23 Encounter for immunization: Secondary | ICD-10-CM | POA: Diagnosis not present

## 2021-04-02 NOTE — Unmapped (Signed)
[facility] Clinical Assessment & Refill Coordination Note    Kristy Hanson, DOB: 1945/07/11  Phone: [phone] (home)     All above HIPAA information was verified with patient.     Was a Nurse, learning disability used for this call? 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 ALPHAGAN P 0.1 % Drop Administer 1 drop to both eyes Two (2) times a day.     ??? aspirin (ECOTRIN) 81 MG tablet Take 81 mg by mouth daily.     ??? calcium carbonate 300 mg (750 mg) Chew Chew.     ??? cholecalciferol, vitamin D3, (VITAMIN D3) 1,000 unit capsule Take 1,000 Units by mouth daily.     ??? DULoxetine (CYMBALTA) 60 MG capsule Take 60 mg by mouth daily.     ??? ferrous sulfate 325 (65 FE) MG tablet Take 325 mg by mouth daily with breakfast.     ??? folic acid (FOLVITE) 1 MG tablet Take 400 mcg by mouth daily.      ??? ledipasvir 90 mg-sofosbuvir 400 mg (HARVONI) tablet Take 1 tablet by mouth daily. (Patient not taking: Reported on 03/30/2017) 28 tablet 2   ??? potassium citrate (UROCIT-K 15) 15 mEq TbER Take 2 tablets by mouth Two (2) times a day.      ??? propranolol (INNOPRAN XL) 80 MG 24 hr capsule Take 80 mg by mouth nightly.     ??? pyridoxine (B-6) 100 MG tablet Take 100 mg by mouth daily.     ??? rizatriptan (MAXALT-MLT) 5 MG disintegrating tablet Take 5 mg by mouth once as needed for migraine. May repeat in 2 hours if needed     ??? tacrolimus (PROGRAF) 0.5 MG capsule Take 3 capsules (1.5 mg total) by mouth two (2) times a day. 180 capsule 2   ??? traMADol (ULTRAM) 50 mg tablet Take 50 mg by mouth every six (6) hours as needed for pain.       No current facility-administered medications for this visit.        Changes to medications: [name] reports no changes at this time.    Allergies   Allergen Reactions   ??? Avastin [Bevacizumab] Other (See Comments)     Mini stroke   ??? Prednisone Other (See Comments)     Patient was told by her ophthalmologist that the vision loss she had in one of her eyes was likely a result of high dose steroids. She also reports insomnia/jitters when taking it.   ??? Amoxicillin-Pot Clavulanate      Other reaction(s): NAUSEA   ??? Codeine      Other reaction(s): NAUSEA   ??? Penicillins      Other reaction(s): UNKNOWN  * Pt would like to know why she shouldn't take this*   ??? Procaine      Other reaction(s): dizziness   ??? Promethazine      oversedation   ??? Sulfa (Sulfonamide Antibiotics)      Other reaction(s): ITCHING   ??? Lidocaine Palpitations   ??? Tamsulosin Itching       Changes to allergies: No    SPECIALTY MEDICATION ADHERENCE     Prograf 0.5mg   : 12 days of medicine on hand       Medication Adherence    Patient reported X missed doses in the last month: 0  Specialty Medication: prograf 0.5mg   Specialty medication(s) dose(s) confirmed: Regimen is correct and unchanged.     Are there any concerns with adherence? No    Adherence counseling provided? Not needed    CLINICAL MANAGEMENT AND INTERVENTION      Clinical Benefit Assessment:    Do you feel the medicine is effective or helping your condition? Yes    Clinical Benefit counseling provided? Not needed    Adverse Effects Assessment:    Are you experiencing any side effects? No    Are you experiencing difficulty administering your medicine? No    Quality of Life Assessment:         How many days over the past month did your transplant  keep you from your normal activities? For example, brushing your teeth or getting up in the morning. 0    Have you discussed this with your provider? Not needed    Acute Infection Status:    Acute infections noted within Epic:  No active infections  Patient reported infection: None    Therapy Appropriateness:    Is therapy appropriate and patient progressing towards therapeutic goals? Yes, therapy is appropriate and should be continued    DISEASE/MEDICATION-SPECIFIC INFORMATION      N/A    PATIENT SPECIFIC NEEDS     - Does the patient have any physical, cognitive, or cultural barriers? No    - Is the patient high risk? No    - Does the patient require a Care Management Plan? No     - Does the patient require physician intervention or other additional services (i.e. nutrition, smoking cessation, social work)? No      SHIPPING     Specialty Medication(s) to be Shipped:   Transplant: Prograf 0.5mg     Other medication(s) to be shipped: No additional medications requested for fill at this time     Changes to insurance: No    Delivery Scheduled: Yes, Expected medication delivery date: 04/11/2021.     Medication will be delivered via UPS to the confirmed prescription address in [facility].    The patient will receive a drug information handout for each medication shipped and additional FDA Medication Guides as required.  Verified that patient has previously received a Conservation officer, historic buildings and a Surveyor, mining.    The patient or caregiver noted above participated in the development of this care plan and knows that they can request review of or adjustments to the care plan at any time.      All of the patient's questions and concerns have been addressed.    Thad Ranger   [facility] Specialty Pharmacist

## 2021-04-03 DIAGNOSIS — Z944 Liver transplant status: Secondary | ICD-10-CM | POA: Diagnosis not present

## 2021-04-03 DIAGNOSIS — E612 Magnesium deficiency: Secondary | ICD-10-CM | POA: Diagnosis not present

## 2021-04-03 DIAGNOSIS — Z5181 Encounter for therapeutic drug level monitoring: Secondary | ICD-10-CM | POA: Diagnosis not present

## 2021-04-04 LAB — COMPREHENSIVE METABOLIC PANEL
A/G RATIO: 1.9 (ref 1.2–2.2)
ALBUMIN: 4.5 g/dL (ref 3.7–4.7)
ALKALINE PHOSPHATASE: 67 IU/L (ref 44–121)
ALT (SGPT): 12 IU/L (ref 0–32)
AST (SGOT): 17 IU/L (ref 0–40)
BILIRUBIN TOTAL: 0.4 mg/dL (ref 0.0–1.2)
BLOOD UREA NITROGEN: 12 mg/dL (ref 8–27)
BUN / CREAT RATIO: 11 — ABNORMAL LOW (ref 12–28)
CALCIUM: 9.5 mg/dL (ref 8.7–10.3)
CHLORIDE: 106 mmol/L (ref 96–106)
CO2: 23 mmol/L (ref 20–29)
CREATININE: 1.08 mg/dL — ABNORMAL HIGH (ref 0.57–1.00)
EGFR: 54 mL/min/{1.73_m2} — ABNORMAL LOW
GLOBULIN, TOTAL: 2.4 g/dL (ref 1.5–4.5)
GLUCOSE: 107 mg/dL — ABNORMAL HIGH (ref 70–99)
POTASSIUM: 3.7 mmol/L (ref 3.5–5.2)
SODIUM: 146 mmol/L — ABNORMAL HIGH (ref 134–144)
TOTAL PROTEIN: 6.9 g/dL (ref 6.0–8.5)

## 2021-04-04 LAB — CBC W/ DIFFERENTIAL
BANDED NEUTROPHILS ABSOLUTE COUNT: 0 10*3/uL (ref 0.0–0.1)
BASOPHILS ABSOLUTE COUNT: 0.1 10*3/uL (ref 0.0–0.2)
BASOPHILS RELATIVE PERCENT: 1 %
EOSINOPHILS ABSOLUTE COUNT: 0.2 10*3/uL (ref 0.0–0.4)
EOSINOPHILS RELATIVE PERCENT: 3 %
HEMATOCRIT: 41 % (ref 34.0–46.6)
HEMOGLOBIN: 14.6 g/dL (ref 11.1–15.9)
IMMATURE GRANULOCYTES: 0 %
LYMPHOCYTES ABSOLUTE COUNT: 1.7 10*3/uL (ref 0.7–3.1)
LYMPHOCYTES RELATIVE PERCENT: 21 %
MEAN CORPUSCULAR HEMOGLOBIN CONC: 35.6 g/dL (ref 31.5–35.7)
MEAN CORPUSCULAR HEMOGLOBIN: 31.2 pg (ref 26.6–33.0)
MEAN CORPUSCULAR VOLUME: 88 fL (ref 79–97)
MONOCYTES ABSOLUTE COUNT: 0.6 10*3/uL (ref 0.1–0.9)
MONOCYTES RELATIVE PERCENT: 8 %
NEUTROPHILS ABSOLUTE COUNT: 5.3 10*3/uL (ref 1.4–7.0)
NEUTROPHILS RELATIVE PERCENT: 67 %
PLATELET COUNT: 198 10*3/uL (ref 150–450)
RED BLOOD CELL COUNT: 4.68 x10E6/uL (ref 3.77–5.28)
RED CELL DISTRIBUTION WIDTH: 13.3 % (ref 11.7–15.4)
WHITE BLOOD CELL COUNT: 7.9 10*3/uL (ref 3.4–10.8)

## 2021-04-04 LAB — MAGNESIUM: MAGNESIUM: 1.7 mg/dL (ref 1.6–2.3)

## 2021-04-04 LAB — BILIRUBIN, DIRECT: BILIRUBIN DIRECT: 0.13 mg/dL (ref 0.00–0.40)

## 2021-04-04 LAB — PHOSPHORUS: PHOSPHORUS, SERUM: 3.7 mg/dL (ref 3.0–4.3)

## 2021-04-04 LAB — GAMMA GT: GAMMA GLUTAMYL TRANSFERASE: 19 IU/L (ref 0–60)

## 2021-04-06 LAB — TACROLIMUS LEVEL: TACROLIMUS BLOOD: 7.4 ng/mL (ref 2.0–20.0)

## 2021-04-07 ENCOUNTER — Telehealth: Payer: Medicare Other

## 2021-04-07 DIAGNOSIS — Z5181 Encounter for therapeutic drug level monitoring: Principal | ICD-10-CM

## 2021-04-07 DIAGNOSIS — Z944 Liver transplant status: Principal | ICD-10-CM

## 2021-04-07 DIAGNOSIS — E612 Magnesium deficiency: Principal | ICD-10-CM

## 2021-04-10 MED FILL — PROGRAF 0.5 MG CAPSULE: ORAL | 30 days supply | Qty: 180 | Fill #1

## 2021-04-14 ENCOUNTER — Ambulatory Visit (INDEPENDENT_AMBULATORY_CARE_PROVIDER_SITE_OTHER): Payer: Medicare Other | Admitting: *Deleted

## 2021-04-14 DIAGNOSIS — H04123 Dry eye syndrome of bilateral lacrimal glands: Secondary | ICD-10-CM | POA: Diagnosis not present

## 2021-04-14 DIAGNOSIS — H40013 Open angle with borderline findings, low risk, bilateral: Secondary | ICD-10-CM | POA: Diagnosis not present

## 2021-04-14 DIAGNOSIS — H348122 Central retinal vein occlusion, left eye, stable: Secondary | ICD-10-CM | POA: Diagnosis not present

## 2021-04-14 DIAGNOSIS — Z961 Presence of intraocular lens: Secondary | ICD-10-CM | POA: Diagnosis not present

## 2021-04-14 DIAGNOSIS — F32A Depression, unspecified: Secondary | ICD-10-CM | POA: Diagnosis not present

## 2021-04-14 DIAGNOSIS — H34812 Central retinal vein occlusion, left eye, with macular edema: Secondary | ICD-10-CM

## 2021-04-14 DIAGNOSIS — F338 Other recurrent depressive disorders: Secondary | ICD-10-CM | POA: Diagnosis not present

## 2021-04-14 DIAGNOSIS — Z5181 Encounter for therapeutic drug level monitoring: Principal | ICD-10-CM

## 2021-04-14 DIAGNOSIS — E612 Magnesium deficiency: Principal | ICD-10-CM

## 2021-04-14 DIAGNOSIS — Z944 Liver transplant status: Principal | ICD-10-CM

## 2021-04-14 NOTE — Patient Instructions (Signed)
Visit Information   PATIENT GOALS/PLAN OF CARE:  Care Plan : LCSW Plan of Care  Updates made by Deirdre Peer, LCSW since 04/14/2021 12:00 AM     Problem: Emotional Distress      Long-Range Goal: Emotional Health Supported   Start Date: 04/14/2021  Expected End Date: 06/13/2021  This Visit's Progress: On track  Priority: High  Note:   Current Barriers:  Chronic Mental Health needs related to depression/anxiety Mental Health Concerns  Suicidal Ideation/Homicidal Ideation: No  Clinical Social Work Goal(s):  patient will work with SW  as needed  by telephone or in person to reduce or manage symptoms related to mental health needs demonstrate a reduction in symptoms related to :Anxiety with Excessive Worry, and Depression: depressed mood anxiety   Interventions: Pt shared with CSW that she has had anxiety for many years- stemming from childhood and worsened by liver transplant.  She also shared she is "losing one of my eyes" and this too impacts her mental state. Pt resides in a retirement community where she lives in independent living- she has not been able to connect socially much even though she is an extrovert because of medical appointments, etc.  Pt denies SI/HI. She was seeing a counselor but stopped going about 2 months ago- would like to pursue another option. Pt drives some and prefers an Arboriculturist. CSW will assist pt with connecting with counselor options.  Patient interviewed and appropriate assessments performed: PHQ 2 PHQ 9 1:1 collaboration with Janith Lima, MD regarding development and update of comprehensive plan of care as evidenced by provider attestation and co-signature Patient interviewed and appropriate assessments performed Provided mental health counseling with regard to depression, anxiety  Discussed plans with patient for ongoing care management follow up and provided patient with direct contact information for care management  team Collaborated with pt and counseling service re: appointment scheduling Depression screen reviewed  PHQ2/ PHQ9 completed Solution-Focused Strategies Active listening / Reflection utilized  Emotional Support Provided Provided psychoeducation for mental health needs  Reviewed mental health medications with patient and discussed importance of compliance:   Patient Self Care Activities:  Self administers medications as prescribed Attends all scheduled provider appointments Calls pharmacy for medication refills Attends church or other social activities Performs ADL's independently Calls provider office for new concerns or questions Ability for insight Independent living Motivation for treatment Strong family or social support   Patient Coping Strengths:  Supportive [place] Organizations Spirituality Hopefulness Self Advocate Able to Communicate Effectively  Patient Self Care Deficits:  Lacks social connections  Patient Goals:  - avoid negative self-talk - develop a Press photographer - develop a plan to deal with triggers like holidays, anniversaries - have a plan for how to handle bad days - journal feelings and what helps to feel better or worse - spend time or talk with others at least 2 to 3 times per week - spend time or talk with others every day - watch for early signs of feeling worse - begin personal counseling - call and visit an old friend - check out volunteer opportunities - join a support group - start or continue a personal journal - talk about feelings with a friend, family or spiritual advisor - call to cancel if needed - keep a calendar with prescription refill dates - keep a calendar with appointment dates  Follow Up Plan: SW will follow up with patient by phone 04/22/21      Consent  to CCM Services: Ms. Gieger was given information about Chronic Care Management services including:  CCM service  includes personalized support from designated clinical staff supervised by her physician, including individualized plan of care and coordination with other care providers 24/7 contact phone numbers for assistance for urgent and routine care needs. Service will only be billed when office clinical staff spend 20 minutes or more in a month to coordinate care. Only one practitioner may furnish and bill the service in a calendar month. The patient may stop CCM services at any time (effective at the end of the month) by phone call to the office staff. The patient will be responsible for cost sharing (co-pay) of up to 20% of the service fee (after annual deductible is met).  Patient agreed to services and verbal consent obtained.   The patient verbalized understanding of instructions, educational materials, and care plan provided today and declined offer to receive copy of patient instructions, educational materials, and care plan.   Telephone follow up appointment with care management team member scheduled for: 04/22/21 Eduard Clos MSW, LCSW Licensed Clinical Social Worker [place] [phone]

## 2021-04-14 NOTE — Chronic Care Management (AMB) (Signed)
Chronic Care Management    Clinical Social Work Note  04/14/2021 Name: Candace Hill MRN: [id number] DOB: 08/07/1945  Candace Hill is a 75 y.o. year old female who is a primary care patient of Janith Lima, MD. The CCM team was consulted to assist the patient with chronic disease management and/or care coordination needs related to: Mental Health Counseling and Resources.   Engaged with patient by telephone for initial visit in response to provider referral for social work chronic care management and care coordination services.   Consent to Services:  The patient was given information about Chronic Care Management services, agreed to services, and gave verbal consent prior to initiation of services.  Please see initial visit note for detailed documentation.   Patient agreed to services and consent obtained.   Assessment: Review of patient past medical history, allergies, medications, and health status, including review of relevant consultants reports was performed today as part of a comprehensive evaluation and provision of chronic care management and care coordination services.     SDOH (Social Determinants of Health) assessments and interventions performed:  SDOH Interventions    Flowsheet Row Most Recent Value  SDOH Interventions   Depression Interventions/Treatment  Counseling        Advanced Directives Status: Not addressed in this encounter.  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     Penicillins Nausea Only and Other (See Comments)    Has patient had a PCN reaction causing immediate rash, facial/tongue/throat swelling, SOB or lightheadedness with hypotension: No Has patient had a PCN reaction causing severe rash involving mucus membranes or skin necrosis: No Has patient had a PCN reaction that required hospitalization No Has patient had a PCN reaction  occurring within the last 10 years: No If all of the above answers are "NO", then may proceed with Cephalosporin use.   Phenergan [Promethazine] Other (See Comments)    Reaction:  Hallucinations  Pt states that she is only allergic to IV form.    Sulfa Antibiotics Itching   Tamsulosin Itching   Lidocaine Palpitations    Outpatient Encounter Medications as of 04/14/2021  Medication Sig   aspirin EC 81 MG tablet Take 81 mg by mouth daily.   atorvastatin (LIPITOR) 10 MG tablet TAKE ONE TABLET BY MOUTH ONCE DAILY FOR CHOLESTEROL.   Biotin 800 MCG TABS Take 1 tablet by mouth.   CALCIUM-VITAMIN D PO Take 600 mg by mouth daily.   DULoxetine (CYMBALTA) 60 MG capsule Take one capsule by mouth once daily.   Ferrous Sulfate (IRON) 325 (65 Fe) MG TABS Take 1 tablet by mouth daily.   fluocinonide cream (LIDEX) 4.58 % Apply 1 application topically 2 (two) times daily.   folic acid (FOLVITE) 099 MCG tablet Take 400 mcg by mouth daily.   GAVILYTE-G 236 g solution TAKE 4,000 ML BY MOUTH FOR 1 DOSE   hydrOXYzine (ATARAX/VISTARIL) 25 MG tablet Take 1 tablet (25 mg total) by mouth every 6 (six) hours as needed for itching.   tacrolimus (PROGRAF) 0.5 MG capsule 6 by mouth daily NAME BRAND ONLY   Facility-Administered Encounter Medications as of 04/14/2021  Medication   0.9 %  sodium chloride infusion    Patient Active Problem List   Diagnosis Date Noted   Intrinsic eczema 01/15/2021   Hyperlipidemia with target LDL less than 130 01/15/2021   Stage 3b chronic  kidney disease ([place]) 01/15/2021   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   Asbestos exposure 04/08/2018   Hyperglycemia 04/08/2018   High risk medication use 04/08/2018   Vitamin D deficiency 04/08/2018   Essential hypertension 10/14/2017   Gastroesophageal reflux disease 10/14/2017   Ureteral stone 08/14/2016   Migraine 07/02/2015   Overweight (BMI 25.0-29.9) 06/28/2015   Chronic hepatitis C virus infection 05/31/2013    Seasonal affective disorder ([place]) 11/14/2012   Allergic rhinitis 11/14/2012   Chronic hepatitis, unspecified ([place]) 11/14/2012   History of liver transplant 11/14/2012   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 03/26/2011   PCO (posterior capsular opacification), right 03/26/2011    Conditions to be addressed/monitored: Anxiety and Depression; Mental Health Concerns   Care Plan : LCSW Plan of Care  Updates made by Deirdre Peer, LCSW since 04/14/2021 12:00 AM     Problem: Emotional Distress      Long-Range Goal: Emotional Health Supported   Start Date: 04/14/2021  Expected End Date: 06/13/2021  This Visit's Progress: On track  Priority: High  Note:   Current Barriers:  Chronic Mental Health needs related to depression/anxiety Mental Health Concerns  Suicidal Ideation/Homicidal Ideation: No  Clinical Social Work Goal(s):  patient will work with SW  as needed  by telephone or in person to reduce or manage symptoms related to mental health needs demonstrate a reduction in symptoms related to :Anxiety with Excessive Worry, and Depression: depressed mood anxiety   Interventions: Pt shared with CSW that she has had anxiety for many years- stemming from childhood and worsened by liver transplant.  She also shared she is "losing one of my eyes" and this too impacts her mental state. Pt resides in a retirement community where she lives in independent living- she has not been able to connect socially much even though she is an extrovert because of medical appointments, etc.  Pt denies SI/HI. She was seeing a counselor but stopped going about 2 months ago- would like to pursue another option. Pt drives some and prefers an Arboriculturist. CSW will assist pt with connecting with counselor options.  Patient interviewed and appropriate assessments performed: PHQ 2 PHQ 9 1:1 collaboration with Janith Lima, MD regarding development and update of comprehensive plan  of care as evidenced by provider attestation and co-signature Patient interviewed and appropriate assessments performed Provided mental health counseling with regard to depression, anxiety  Discussed plans with patient for ongoing care management follow up and provided patient with direct contact information for care management team Collaborated with pt and counseling service re: appointment scheduling Depression screen reviewed  PHQ2/ PHQ9 completed Solution-Focused Strategies Active listening / Reflection utilized  Emotional Support Provided Provided psychoeducation for mental health needs  Reviewed mental health medications with patient and discussed importance of compliance:   Patient Self Care Activities:  Self administers medications as prescribed Attends all scheduled provider appointments Calls pharmacy for medication refills Attends church or other social activities Performs ADL's independently Calls provider office for new concerns or questions Ability for insight Independent living Motivation for treatment Strong family or social support   Patient Coping Strengths:  Supportive [place] Organizations Spirituality Hopefulness Self Advocate Able to Communicate Effectively  Patient Self Care Deficits:  Lacks social connections  Patient Goals:  - avoid negative self-talk - develop a Press photographer - develop a plan to deal with triggers like holidays, anniversaries - have a plan for how to handle bad days -  journal feelings and what helps to feel better or worse - spend time or talk with others at least 2 to 3 times per week - spend time or talk with others every day - watch for early signs of feeling worse - begin personal counseling - call and visit an old friend - check out volunteer opportunities - join a support group - start or continue a personal journal - talk about feelings with a friend, family or  spiritual advisor - call to cancel if needed - keep a calendar with prescription refill dates - keep a calendar with appointment dates  Follow Up Plan: SW will follow up with patient by phone 04/22/21       Follow Up Plan: Appointment scheduled for SW follow up with client by phone on: 04/22/21      Eduard Clos MSW, [place] Licensed Clinical Social Worker [place] [phone]

## 2021-04-15 ENCOUNTER — Encounter (HOSPITAL_BASED_OUTPATIENT_CLINIC_OR_DEPARTMENT_OTHER): Payer: Self-pay | Admitting: *Deleted

## 2021-04-15 ENCOUNTER — Emergency Department (HOSPITAL_BASED_OUTPATIENT_CLINIC_OR_DEPARTMENT_OTHER)
Admission: EM | Admit: 2021-04-15 | Discharge: 2021-04-15 | Disposition: A | Discharge status: Home / Self Care | Payer: Medicare Other | Attending: Emergency Medicine | Admitting: Emergency Medicine

## 2021-04-15 ENCOUNTER — Emergency Department (HOSPITAL_COMMUNITY): Payer: Medicare Other

## 2021-04-15 ENCOUNTER — Other Ambulatory Visit: Payer: Self-pay

## 2021-04-15 ENCOUNTER — Emergency Department (HOSPITAL_BASED_OUTPATIENT_CLINIC_OR_DEPARTMENT_OTHER): Payer: Medicare Other

## 2021-04-15 DIAGNOSIS — I129 Hypertensive chronic kidney disease with stage 1 through stage 4 chronic kidney disease, or unspecified chronic kidney disease: Secondary | ICD-10-CM | POA: Insufficient documentation

## 2021-04-15 DIAGNOSIS — N1832 Chronic kidney disease, stage 3b: Secondary | ICD-10-CM | POA: Diagnosis not present

## 2021-04-15 DIAGNOSIS — J45909 Unspecified asthma, uncomplicated: Secondary | ICD-10-CM | POA: Insufficient documentation

## 2021-04-15 DIAGNOSIS — G319 Degenerative disease of nervous system, unspecified: Secondary | ICD-10-CM | POA: Diagnosis not present

## 2021-04-15 DIAGNOSIS — Z8505 Personal history of malignant neoplasm of liver: Secondary | ICD-10-CM | POA: Insufficient documentation

## 2021-04-15 DIAGNOSIS — R791 Abnormal coagulation profile: Secondary | ICD-10-CM | POA: Insufficient documentation

## 2021-04-15 DIAGNOSIS — Z7982 Long term (current) use of aspirin: Secondary | ICD-10-CM | POA: Insufficient documentation

## 2021-04-15 DIAGNOSIS — H547 Unspecified visual loss: Secondary | ICD-10-CM | POA: Diagnosis not present

## 2021-04-15 DIAGNOSIS — H539 Unspecified visual disturbance: Secondary | ICD-10-CM | POA: Diagnosis not present

## 2021-04-15 DIAGNOSIS — I10 Essential (primary) hypertension: Secondary | ICD-10-CM | POA: Diagnosis not present

## 2021-04-15 DIAGNOSIS — H538 Other visual disturbances: Secondary | ICD-10-CM | POA: Insufficient documentation

## 2021-04-15 DIAGNOSIS — Z79899 Other long term (current) drug therapy: Secondary | ICD-10-CM | POA: Diagnosis not present

## 2021-04-15 DIAGNOSIS — Z20822 Contact with and (suspected) exposure to covid-19: Secondary | ICD-10-CM | POA: Diagnosis not present

## 2021-04-15 LAB — URINALYSIS, ROUTINE W REFLEX MICROSCOPIC
Bilirubin Urine: NEGATIVE
Glucose, UA: NEGATIVE mg/dL
Hgb urine dipstick: NEGATIVE
Ketones, ur: NEGATIVE mg/dL
Nitrite: NEGATIVE
Protein, ur: NEGATIVE mg/dL
Specific Gravity, Urine: 1.012 (ref 1.005–1.030)
pH: 6.5 (ref 5.0–8.0)

## 2021-04-15 LAB — CBC
HCT: 39.4 % (ref 36.0–46.0)
Hemoglobin: 13.7 g/dL (ref 12.0–15.0)
MCH: 30.6 pg (ref 26.0–34.0)
MCHC: 34.8 g/dL (ref 30.0–36.0)
MCV: 88.1 fL (ref 80.0–100.0)
Platelets: 185 10*3/uL (ref 150–400)
RBC: 4.47 MIL/uL (ref 3.87–5.11)
RDW: 13 % (ref 11.5–15.5)
WBC: 8.9 10*3/uL (ref 4.0–10.5)
nRBC: 0 % (ref 0.0–0.2)

## 2021-04-15 LAB — RAPID URINE DRUG SCREEN, HOSP PERFORMED
Amphetamines: NOT DETECTED
Barbiturates: NOT DETECTED
Benzodiazepines: NOT DETECTED
Cocaine: NOT DETECTED
Opiates: NOT DETECTED
Tetrahydrocannabinol: NOT DETECTED

## 2021-04-15 LAB — DIFFERENTIAL
Abs Immature Granulocytes: 0.04 10*3/uL (ref 0.00–0.07)
Basophils Absolute: 0.1 10*3/uL (ref 0.0–0.1)
Basophils Relative: 1 %
Eosinophils Absolute: 0.2 10*3/uL (ref 0.0–0.5)
Eosinophils Relative: 2 %
Immature Granulocytes: 1 %
Lymphocytes Relative: 24 %
Lymphs Abs: 2.1 10*3/uL (ref 0.7–4.0)
Monocytes Absolute: 0.6 10*3/uL (ref 0.1–1.0)
Monocytes Relative: 7 %
Neutro Abs: 5.9 10*3/uL (ref 1.7–7.7)
Neutrophils Relative %: 65 %

## 2021-04-15 LAB — COMPREHENSIVE METABOLIC PANEL
ALT: 11 U/L (ref 0–44)
AST: 15 U/L (ref 15–41)
Albumin: 4.4 g/dL (ref 3.5–5.0)
Alkaline Phosphatase: 49 U/L (ref 38–126)
Anion gap: 9 (ref 5–15)
BUN: 14 mg/dL (ref 8–23)
CO2: 29 mmol/L (ref 22–32)
Calcium: 9 mg/dL (ref 8.9–10.3)
Chloride: 103 mmol/L (ref 98–111)
Creatinine, Ser: 1.01 mg/dL — ABNORMAL HIGH (ref 0.44–1.00)
GFR, Estimated: 59 mL/min — ABNORMAL LOW (ref >60)
Glucose, Bld: 98 mg/dL (ref 70–99)
Potassium: 3.3 mmol/L — ABNORMAL LOW (ref 3.5–5.1)
Sodium: 141 mmol/L (ref 135–145)
Total Bilirubin: 0.7 mg/dL (ref 0.3–1.2)
Total Protein: 7.3 g/dL (ref 6.5–8.1)

## 2021-04-15 LAB — PROTIME-INR
INR: 0.9 (ref 0.8–1.2)
Prothrombin Time: 12.4 seconds (ref 11.4–15.2)

## 2021-04-15 LAB — RESP PANEL BY RT-PCR (FLU A&B, COVID) ARPGX2
Influenza A by PCR: NEGATIVE
Influenza B by PCR: NEGATIVE
SARS Coronavirus 2 by RT PCR: NEGATIVE

## 2021-04-15 LAB — C-REACTIVE PROTEIN: CRP: 0.6 mg/dL (ref <1.0)

## 2021-04-15 LAB — SEDIMENTATION RATE: Sed Rate: 22 mm/hr (ref 0–22)

## 2021-04-15 LAB — ETHANOL: Alcohol, Ethyl (B): <10 mg/dL (ref <10)

## 2021-04-15 LAB — APTT: aPTT: 40 seconds — ABNORMAL HIGH (ref 24–36)

## 2021-04-15 MED ORDER — POTASSIUM CHLORIDE CRYS ER 20 MEQ PO TBCR
40.0000 meq | EXTENDED_RELEASE_TABLET | Freq: Once | ORAL | Status: AC
  Administered 2021-04-15: 40 meq via ORAL
  Filled 2021-04-15: qty 2

## 2021-04-15 MED ORDER — GADOBUTROL 1 MMOL/ML IV SOLN
7.0000 mL | Freq: Once | INTRAVENOUS | Status: AC | PRN
  Administered 2021-04-15: 7 mL via INTRAVENOUS

## 2021-04-15 MED ORDER — TETRACAINE HCL 0.5 % OP SOLN
2.0000 [drp] | Freq: Once | OPHTHALMIC | Status: DC
  Filled 2021-04-15: qty 4

## 2021-04-15 NOTE — ED Notes (Signed)
Pt assisted to bathroom, standby assistance.

## 2021-04-15 NOTE — ED Notes (Signed)
Transfer paperwork provided to pts daughter, April.  RN explained to go directly to [facility] ED. Pt and daughter verbalized understand. PIV secured with coban for POV transport. Pt ambulatory to ED exit.

## 2021-04-15 NOTE — ED Provider Notes (Addendum)
Care assumed from Dr. Goldston as transfer for MRI brain to rule out CVA from [facility]. See his note for full HPI.  In short, patient is a 75-year-old female who presents to the ED due to left-sided visual disturbances x2 days.  Patient states at 1 point she had tunnel vision out of her left eye.  No speech changes.  No numbness/tingling.  No unilateral weakness.  Patient was seen by her eye doctor yesterday with a unremarkable eye exam per patient. Patient has a history of central retinal vein occlusion.  Physical Exam  BP (!) 169/89 (BP Location: Right Arm)   Pulse 76   Temp 97.9 F (36.6 C) (Oral)   Resp 16   Ht 5' 3" (1.6 m)   Wt 69.9 kg   SpO2 99%   BMI 27.28 kg/m   Physical Exam Vitals and nursing note reviewed.  Constitutional:      General: She is not in acute distress.    Appearance: She is not ill-appearing.  HENT:     Head: Normocephalic.  Eyes:     Pupils: Pupils are equal, round, and reactive to light.  Cardiovascular:     Rate and Rhythm: Normal rate and regular rhythm.     Pulses: Normal pulses.     Heart sounds: Normal heart sounds. No murmur heard.   No friction rub. No gallop.  Pulmonary:     Effort: Pulmonary effort is normal.     Breath sounds: Normal breath sounds.  Abdominal:     General: Abdomen is flat. There is no distension.     Palpations: Abdomen is soft.     Tenderness: There is no abdominal tenderness. There is no guarding or rebound.  Musculoskeletal:        General: Normal range of motion.     Cervical back: Neck supple.  Skin:    General: Skin is warm and dry.  Neurological:     General: No focal deficit present.     Mental Status: She is alert.  Psychiatric:        Mood and Affect: Mood normal.        Behavior: Behavior normal.    ED Course/Procedures     Procedures  MDM   75-year-old female presents to the ED due to left eye visual disturbance x2 days.  Patient initially evaluated at [facility] however, was  transferred to [place] for MRI brain to rule out CVA. Labs ordered by previous provider were all reassuring, included CRP and ESR.   MRI personally reviewed which is negative for any acute abnormalities.  Did show multifocal hyperdense T2 weighted signal within the white matter most consistent with chronic microvascular ischemia.  Previous provider checked IOP which was normal.  Low suspicion for glaucoma.  Ambulatory referral placed for neurology.  Advised patient to follow-up with ophthalmologist for further evaluation. Strict ED precautions discussed with patient. Patient states understanding and agrees to plan. Patient discharged home in no acute distress and stable vitals   Discussed with Dr. Messick who evaluated patient at bedside and agrees with assessment and plan.    ,  C, PA-C 04/15/21 2019    Messick, Peter C, MD 04/15/21 2258  

## 2021-04-15 NOTE — ED Provider Notes (Signed)
[facility] EMERGENCY DEPT Provider Note   CSN: [id number] Arrival date & time: 04/15/21  1023     History Chief Complaint  Patient presents with   Altered Mental Status    Candace Hill is a 75 y.o. female.  HPI 75 year old female presents with a chief complaint of left-sided visual abnormality and periorbital discomfort.  Started 2 nights ago.  It is a continuous sensation.  Feels like her left periorbital region is swollen and tight though it has not been.  Is not really painful.  She feels like the left lateral vision field is abnormal and at one point she was having like tunnel vision out of her left eye where she could only see a small area in the middle of her left eye vision.  She feels like this is only affecting her left eye.  She can manipulate right versus left eye and it does not seem to be affecting the right.  No fevers.  No focal weakness or numbness.  She is concerned about stroke/mini stroke.  She states she saw an optometrist who told her her eyes were okay yesterday.  Past Medical History:  Diagnosis Date   Allergic rhinitis 11/14/2012   Amblyopia of eye, left 06/17/2017   Asbestos exposure 04/08/2018   Asthma    1994-1994 due to black mold house, no asthma now   Broken ankle    right   Broken rib    x2   Cataract    Central retinal vein occlusion    left eye retinal occlusion   Chronic hepatitis C virus infection 62/94/7654   Complication of anesthesia    IV phenergan  serious mental reaction   Depression    Essential hypertension 10/14/2017   Gastroesophageal reflux disease 10/14/2017   Hernia, femoral    left   History of blood transfusion 1971   acquired hepatitis c   History of kidney stones    History of liver transplant 11/14/2012   History of nephrolithiasis 10/14/2017   History of renal stone    History of shingles    Hyperglycemia 04/08/2018   Malignant neoplasm of liver 11/14/2012   Migraine 07/02/2015   Mild vascular  neurocognitive disorder 02/08/2020   Seasonal affective disorder    Splenic artery aneurysm ([place])    Tailbone injury age 62 and age 34 and few weeks ago   broken x3   TIA (transient ischemic attack)    x3   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-will get Avastin injections off/on   Vitamin D deficiency 04/08/2018    Patient Active Problem List   Diagnosis Date Noted   Intrinsic eczema 01/15/2021   Hyperlipidemia with target LDL less than 130 01/15/2021   Stage 3b chronic kidney disease ([place]) 01/15/2021   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   Asbestos exposure 04/08/2018   Hyperglycemia 04/08/2018   High risk medication use 04/08/2018   Vitamin D deficiency 04/08/2018   Essential hypertension 10/14/2017   Gastroesophageal reflux disease 10/14/2017   Ureteral stone 08/14/2016   Migraine 07/02/2015   Overweight (BMI 25.0-29.9) 06/28/2015   Chronic hepatitis C virus infection 05/31/2013   Seasonal affective disorder ([place]) 11/14/2012   Allergic rhinitis 11/14/2012   Chronic hepatitis, unspecified ([place]) 11/14/2012   History of liver transplant 11/14/2012   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 03/26/2011   PCO (posterior capsular opacification), right 03/26/2011    Past Surgical History:  Procedure Laterality Date  CATARACT EXTRACTION Bilateral    CHOLECYSTECTOMY  1997   CYSTOSCOPY W/ URETERAL STENT PLACEMENT Left 08/14/2016   Procedure: CYSTOSCOPY WITH RETROGRADE PYELOGRAM/URETERAL LEFT URETEROSCOPY AND LEFT STENT PLACEMENT;  Surgeon: Raynelle Bring, MD;  Location: [facility];  Service: Urology;  Laterality: Left;   CYSTOSCOPY/URETEROSCOPY/HOLMIUM LASER/STENT PLACEMENT Left 07/06/2016   Procedure: CYSTOSCOPY/URETEROSCOPY/ BILATERAL RETROGRADE/HOLMIUM LASER/STENT PLACEMENT/BASKET STONE REMOVAL;  Surgeon: Raynelle Bring, MD;  Location: [facility];  Service: Urology;  Laterality: Left;   CYSTOSCOPY/URETEROSCOPY/HOLMIUM LASER/STENT  PLACEMENT Left 08/06/2016   Procedure: CYSTOSCOPY/URETEROSCOPY/HOLMIUM LASER/STENT PLACEMENT;  Surgeon: Raynelle Bring, MD;  Location: [facility];  Service: Urology;  Laterality: Left;   CYSTOSCOPY/URETEROSCOPY/HOLMIUM LASER/STENT PLACEMENT Left 08/31/2016   Procedure: CYSTOSCOPY/URETEROSCOPY/HOLMIUM LASER/STENT PLACEMENT/BASKET STONE REMOVAL;  Surgeon: Raynelle Bring, MD;  Location: [facility];  Service: Urology;  Laterality: Left;   FEMORAL HERNIA REPAIR Left 1997   left leg-   LIVER TRANSPLANTATION     NASAL SINUS SURGERY  1997   x3   TONSILLECTOMY  age 60   TUBAL LIGATION       OB History     Gravida  2   Para  2   Term  2   Preterm      AB      Living  2      SAB      IAB      Ectopic      Multiple      Live Births              Family History  Adopted: Yes  Problem Relation Age of Onset   Stroke Mother    Alzheimer's disease Mother    Cancer Mother    Diabetes Mother        Questionable, per new patient packet    Arthritis Mother        Questionable, per new patient packet    Alcoholism Father    Suicidality Sister    Depression Sister    COPD Sister    COPD Brother        Heavy smoker, per new patient packet    OCD Daughter    Personality disorder Daughter    Alcoholism Son    Bipolar disorder Granddaughter        Age 66 as of 07/10/2020   Schizophrenia Granddaughter     Social History   Tobacco Use   Smoking status: Never   Smokeless tobacco: Never  Vaping Use   Vaping Use: Never used  Substance Use Topics   Alcohol use: No   Drug use: No    Home Medications Prior to Admission medications   Medication Sig Start Date End Date Taking? Authorizing Provider  aspirin EC 81 MG tablet Take 81 mg by mouth daily.   Yes [provider]  atorvastatin (LIPITOR) 10 MG tablet TAKE ONE TABLET BY MOUTH ONCE DAILY FOR CHOLESTEROL. 04/01/21  Yes Janith Lima, MD  Biotin 800 MCG TABS Take 1 tablet by mouth.   Yes [provider]   CALCIUM-VITAMIN D PO Take 600 mg by mouth daily.   Yes [provider]  DULoxetine (CYMBALTA) 60 MG capsule Take one capsule by mouth once daily. 12/23/20  Yes Lauree Chandler, NP  Ferrous Sulfate (IRON) 325 (65 Fe) MG TABS Take 1 tablet by mouth daily.   Yes [provider]  folic acid (FOLVITE) 093 MCG tablet Take 800 mcg by mouth daily.   Yes [provider]  tacrolimus (PROGRAF) 0.5 MG capsule 6 by  mouth daily NAME BRAND ONLY   Yes [provider]  fluocinonide cream (LIDEX) 1.63 % Apply 1 application topically 2 (two) times daily. Patient not taking: Reported on 04/15/2021 01/15/21   Janith Lima, MD  GAVILYTE-G 236 g solution TAKE 4,000 ML BY MOUTH FOR 1 DOSE Patient not taking: Reported on 04/15/2021 01/17/21   Doran Stabler, MD  hydrOXYzine (ATARAX/VISTARIL) 25 MG tablet Take 1 tablet (25 mg total) by mouth every 6 (six) hours as needed for itching. Patient not taking: Reported on 04/15/2021 07/10/20   Lauree Chandler, NP    Allergies    Codeine, Other, Penicillins, Phenergan [promethazine], Sulfa antibiotics, Tamsulosin, and Lidocaine  Review of Systems   Review of Systems  Constitutional:  Negative for fever.  HENT:  Negative for facial swelling.   Eyes:  Positive for visual disturbance.  Neurological:  Positive for headaches. Negative for weakness and numbness.  All other systems reviewed and are negative.  Physical Exam Updated Vital Signs BP (!) 161/92   Pulse 73   Temp 98.7 F (37.1 C)   Resp (!) 21   Ht _0  (1.6 m)   Wt 69.9 kg   SpO2 100%   BMI 27.28 kg/m   Physical Exam Vitals and nursing note reviewed.  Constitutional:      Appearance: She is well-developed.  HENT:     Head: Normocephalic and atraumatic.     Comments: No periorbital swelling or tenderness to the left temple    Right Ear: External ear normal.     Left Ear: External ear normal.     Nose: Nose normal.  Eyes:     General:        Right eye: No  discharge.        Left eye: No discharge.  Cardiovascular:     Rate and Rhythm: Normal rate and regular rhythm.     Heart sounds: Normal heart sounds.  Pulmonary:     Effort: Pulmonary effort is normal.     Breath sounds: Normal breath sounds.  Abdominal:     Palpations: Abdomen is soft.     Tenderness: There is no abdominal tenderness.  Skin:    General: Skin is warm and dry.  Neurological:     Mental Status: She is alert.     Comments: CN 3-12 grossly intact. 5/5 strength in all 4 extremities. Grossly normal sensation. Normal finger to nose.   Psychiatric:        Mood and Affect: Mood is not anxious.    ED Results / Procedures / Treatments   Labs (all labs ordered are listed, but only abnormal results are displayed) Labs Reviewed  APTT - Abnormal; Notable for the following components:      Result Value   aPTT 40 (*)    All other components within normal limits  COMPREHENSIVE METABOLIC PANEL - Abnormal; Notable for the following components:   Potassium 3.3 (*)    Creatinine, Ser 1.01 (*)    GFR, Estimated 59 (*)    All other components within normal limits  RESP PANEL BY RT-PCR (FLU A&B, COVID) ARPGX2  ETHANOL  PROTIME-INR  CBC  DIFFERENTIAL  SEDIMENTATION RATE  C-REACTIVE PROTEIN  RAPID URINE DRUG SCREEN, HOSP PERFORMED  URINALYSIS, ROUTINE W REFLEX MICROSCOPIC  CBG MONITORING, ED    EKG EKG Interpretation  Date/Time:  Tuesday April 15 2021 13:05:54 EDT Ventricular Rate:  73 PR Interval:  250 QRS Duration: 85 QT Interval:  398 QTC Calculation:  439 R Axis:   69 Text Interpretation: Sinus rhythm Prolonged PR interval Borderline T abnormalities, lateral leads similar to 2018 Confirmed by Sherwood Gambler [phone]) on 04/15/2021 2:40:46 PM  Radiology CT HEAD WO CONTRAST  Result Date: 04/15/2021 CLINICAL DATA:  Vision loss. EXAM: CT HEAD WITHOUT CONTRAST TECHNIQUE: Contiguous axial images were obtained from the base of the skull through the vertex without  intravenous contrast. COMPARISON:  July 20, 2018. FINDINGS: Brain: No evidence of acute infarction, hemorrhage, hydrocephalus, extra-axial collection or mass lesion/mass effect. Vascular: No hyperdense vessel or unexpected calcification. Skull: Normal. Negative for fracture or focal lesion. Sinuses/Orbits: No acute finding. Other: None. IMPRESSION: No acute intracranial abnormality seen. Electronically Signed   By: Marijo Conception M.D.   On: 04/15/2021 12:02    Procedures Procedures   Medications Ordered in ED Medications  tetracaine (PONTOCAINE) 0.5 % ophthalmic solution 2 drop (has no administration in time range)  potassium chloride SA (KLOR-CON) CR tablet 40 mEq (40 mEq Oral Given 04/15/21 1440)    ED Course  I have reviewed the triage vital signs and the nursing notes.  Pertinent labs & imaging results that were available during my care of the patient were reviewed by me and considered in my medical decision making (see chart for details).    MDM Rules/Calculators/A&P                           Patient is a poor historian which limits the history somewhat.  However it seems like that while she has had central retinal vein occlusion in the past and has been following with ophthalmology for this for quite some time, she has never had symptoms like what she is describing over the last couple days.  I did do an intraocular pressure check and the highest reading was 24 which is not consistent with glaucoma and I do not think that would represent her symptoms today.  Work-up is fairly unremarkable including normal ESR.  I discussed with Dr. Cheral Marker, who states that given age and visual complaints would be reasonable to get MRI with and without of her brain to help rule out acute CNS pathology/stroke.  Daughter really wants her to have stroke ruled out.  She will have to be transferred to [facility] for this.  Daughter will take POV.  Neurology recommends neurology consult if MRI is positive.   Patient accepted to [facility] by Dr. Doren Custard. Final Clinical Impression(s) / ED Diagnoses Final diagnoses:  Visual disturbance of one eye    Rx / DC Orders ED Discharge Orders     None        Sherwood Gambler, MD 04/15/21 1540

## 2021-04-15 NOTE — ED Notes (Signed)
Pt discharged and ambulated out of the ED without difficulty. 

## 2021-04-15 NOTE — ED Triage Notes (Signed)
History of TIA's. 2 days ago left eye was swollen, felt tight around eye. Then had visual loss. Normal eye exam yesterday at [facility] now says, face around left eye was swollen and hard.

## 2021-04-15 NOTE — Discharge Instructions (Addendum)
It was a pleasure taking care of you today. As discussed, your MRI did not show a stroke. I have placed a referral to neurology. You should receive a phone call within the next week. If you do not hear from them, please call to schedule an appointment.  Please follow-up with your eye doctor for further evaluation.  Return to the ER for new or worsening symptoms.

## 2021-04-17 ENCOUNTER — Encounter: Payer: Self-pay | Admitting: Neurology

## 2021-04-17 ENCOUNTER — Ambulatory Visit (INDEPENDENT_AMBULATORY_CARE_PROVIDER_SITE_OTHER): Payer: Medicare Other | Admitting: *Deleted

## 2021-04-17 DIAGNOSIS — I10 Essential (primary) hypertension: Secondary | ICD-10-CM

## 2021-04-17 DIAGNOSIS — F32A Depression, unspecified: Secondary | ICD-10-CM

## 2021-04-17 NOTE — Unmapped (Signed)
Received notice from pharmacy that the patient's prograf was no longer covered by her insurance. Sent generic rx to patient's pharmacy and let her know about need to switch to generic. She verbalized understanding.

## 2021-04-21 DIAGNOSIS — Z944 Liver transplant status: Principal | ICD-10-CM

## 2021-04-21 DIAGNOSIS — Z5181 Encounter for therapeutic drug level monitoring: Principal | ICD-10-CM

## 2021-04-21 DIAGNOSIS — E612 Magnesium deficiency: Principal | ICD-10-CM

## 2021-04-21 NOTE — Chronic Care Management (AMB) (Signed)
Chronic Care Management    Clinical Social Work Note  04/21/2021 Name: Candace Hill MRN: [id number] DOB: Apr 10, 1946  Candace Hill is a 75 y.o. year old female who is a primary care patient of Candace Lima, MD. The CCM team was consulted to assist the patient with chronic disease management and/or care coordination needs related to: [employer]  and [place] and Resources.   Engaged with patient by telephone for follow up visit in response to provider referral for social work chronic care management and care coordination services.   Consent to Services:  The patient was given information about Chronic Care Management services, agreed to services, and gave verbal consent prior to initiation of services.  Please see initial visit note for detailed documentation.   Patient agreed to services and consent obtained.   Assessment: Review of patient past medical history, allergies, medications, and health status, including review of relevant consultants reports was performed today as part of a comprehensive evaluation and provision of chronic care management and care coordination services.     SDOH (Social Determinants of Health) assessments and interventions performed:    Advanced Directives Status: Not addressed in this encounter.  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     Penicillins Nausea Only and Other (See Comments)    Has patient had a PCN reaction causing immediate rash, facial/tongue/throat swelling, SOB or lightheadedness with hypotension: No Has patient had a PCN reaction causing severe rash involving mucus membranes or skin necrosis: No Has patient had a PCN reaction that required hospitalization No Has patient had a PCN reaction occurring within the last 10 years: No If all of the above answers are "NO", then may proceed with  Cephalosporin use.   Phenergan [Promethazine] Other (See Comments)    Reaction:  Hallucinations  Pt states that she is only allergic to IV form.    Sulfa Antibiotics Itching   Tamsulosin Itching   Lidocaine Palpitations    Outpatient Encounter Medications as of 04/17/2021  Medication Sig   aspirin EC 81 MG tablet Take 81 mg by mouth daily.   atorvastatin (LIPITOR) 10 MG tablet TAKE ONE TABLET BY MOUTH ONCE DAILY FOR CHOLESTEROL.   Biotin 800 MCG TABS Take 1 tablet by mouth.   CALCIUM-VITAMIN D PO Take 600 mg by mouth daily.   DULoxetine (CYMBALTA) 60 MG capsule Take one capsule by mouth once daily.   Ferrous Sulfate (IRON) 325 (65 Fe) MG TABS Take 1 tablet by mouth daily.   fluocinonide cream (LIDEX) 6.72 % Apply 1 application topically 2 (two) times daily. (Patient not taking: Reported on 02/17/7095)   folic acid (FOLVITE) 283 MCG tablet Take 800 mcg by mouth daily.   GAVILYTE-G 236 g solution TAKE 4,000 ML BY MOUTH FOR 1 DOSE (Patient not taking: Reported on 04/15/2021)   hydrOXYzine (ATARAX/VISTARIL) 25 MG tablet Take 1 tablet (25 mg total) by mouth every 6 (six) hours as needed for itching. (Patient not taking: Reported on 04/15/2021)   tacrolimus (PROGRAF) 0.5 MG capsule 6 by mouth daily NAME BRAND ONLY   Facility-Administered Encounter Medications as of 04/17/2021  Medication   0.9 %  sodium chloride infusion    Patient Active Problem List   Diagnosis Date Noted   Intrinsic eczema 01/15/2021   Hyperlipidemia with target LDL less than 130 01/15/2021   Stage 3b chronic kidney  disease ([place]) 01/15/2021   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   Asbestos exposure 04/08/2018   Hyperglycemia 04/08/2018   High risk medication use 04/08/2018   Vitamin D deficiency 04/08/2018   Essential hypertension 10/14/2017   Gastroesophageal reflux disease 10/14/2017   Ureteral stone 08/14/2016   Migraine 07/02/2015   Overweight (BMI 25.0-29.9) 06/28/2015   Chronic hepatitis C virus  infection 05/31/2013   Seasonal affective disorder ([place]) 11/14/2012   Allergic rhinitis 11/14/2012   Chronic hepatitis, unspecified ([place]) 11/14/2012   History of liver transplant 11/14/2012   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    Follow Up Plan: Appointment scheduled for SW follow up with client by phone on: 04/22/21 Eduard Clos MSW, [place] Licensed Clinical Social Worker [place] [phone]

## 2021-04-21 NOTE — Patient Instructions (Signed)
Visit Information  The patient verbalized understanding of instructions, educational materials, and care plan provided today and declined offer to receive copy of patient instructions, educational materials, and care plan.   Telephone follow up appointment with care management team member scheduled for:04/22/21  Eduard Clos MSW, LCSW Licensed Clinical Social Worker [place] [phone]

## 2021-04-22 ENCOUNTER — Other Ambulatory Visit: Payer: Self-pay | Admitting: Nurse Practitioner

## 2021-04-22 ENCOUNTER — Ambulatory Visit: Payer: Medicare Other | Admitting: *Deleted

## 2021-04-22 DIAGNOSIS — F32A Depression, unspecified: Secondary | ICD-10-CM

## 2021-04-22 DIAGNOSIS — F419 Anxiety disorder, unspecified: Secondary | ICD-10-CM

## 2021-04-22 DIAGNOSIS — H34812 Central retinal vein occlusion, left eye, with macular edema: Secondary | ICD-10-CM

## 2021-04-22 DIAGNOSIS — F338 Other recurrent depressive disorders: Secondary | ICD-10-CM

## 2021-04-22 DIAGNOSIS — F339 Major depressive disorder, recurrent, unspecified: Secondary | ICD-10-CM

## 2021-04-24 ENCOUNTER — Other Ambulatory Visit: Payer: Self-pay | Admitting: Internal Medicine

## 2021-04-24 DIAGNOSIS — F339 Major depressive disorder, recurrent, unspecified: Secondary | ICD-10-CM

## 2021-04-24 DIAGNOSIS — F419 Anxiety disorder, unspecified: Secondary | ICD-10-CM

## 2021-04-24 NOTE — Patient Instructions (Signed)
Visit Information  (Copy and paste patient goals from clinical care plan here)  The patient verbalized understanding of instructions, educational materials, and care plan provided today and declined offer to receive copy of patient instructions, educational materials, and care plan.   Telephone follow up appointment with care management team member scheduled for:05/26/21  Eduard Clos MSW, LCSW Licensed Clinical Social Worker [place] [phone]

## 2021-04-24 NOTE — Chronic Care Management (AMB) (Signed)
Chronic Care Management    Clinical Social Work Note  04/24/2021 Name: Candace Hill MRN: [id number] DOB: 23-Jan-1946  Candace Hill is a 75 y.o. year old female who is a primary care patient of Janith Lima, MD. The CCM team was consulted to assist the patient with chronic disease management and/or care coordination needs related to: Mental Health Counseling and Resources.   Engaged with patient by telephone for follow up visit in response to provider referral for social work chronic care management and care coordination services.   Consent to Services:  The patient was given information about Chronic Care Management services, agreed to services, and gave verbal consent prior to initiation of services.  Please see initial visit note for detailed documentation.   Patient agreed to services and consent obtained.   Assessment: Review of patient past medical history, allergies, medications, and health status, including review of relevant consultants reports was performed today as part of a comprehensive evaluation and provision of chronic care management and care coordination services.     SDOH (Social Determinants of Health) assessments and interventions performed:    Advanced Directives Status: Not addressed in this encounter.  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     Penicillins Nausea Only and Other (See Comments)    Has patient had a PCN reaction causing immediate rash, facial/tongue/throat swelling, SOB or lightheadedness with hypotension: No Has patient had a PCN reaction causing severe rash involving mucus membranes or skin necrosis: No Has patient had a PCN reaction that required hospitalization No Has patient had a PCN reaction occurring within the last 10 years: No If all of the above answers are "NO", then may proceed with Cephalosporin use.   Phenergan  [Promethazine] Other (See Comments)    Reaction:  Hallucinations  Pt states that she is only allergic to IV form.    Sulfa Antibiotics Itching   Tamsulosin Itching   Lidocaine Palpitations    Outpatient Encounter Medications as of 04/22/2021  Medication Sig   aspirin EC 81 MG tablet Take 81 mg by mouth daily.   atorvastatin (LIPITOR) 10 MG tablet TAKE ONE TABLET BY MOUTH ONCE DAILY FOR CHOLESTEROL.   Biotin 800 MCG TABS Take 1 tablet by mouth.   CALCIUM-VITAMIN D PO Take 600 mg by mouth daily.   DULoxetine (CYMBALTA) 60 MG capsule Take one capsule by mouth once daily.   Ferrous Sulfate (IRON) 325 (65 Fe) MG TABS Take 1 tablet by mouth daily.   fluocinonide cream (LIDEX) 7.84 % Apply 1 application topically 2 (two) times daily. (Patient not taking: Reported on 69/11/2950)   folic acid (FOLVITE) 841 MCG tablet Take 800 mcg by mouth daily.   GAVILYTE-G 236 g solution TAKE 4,000 ML BY MOUTH FOR 1 DOSE (Patient not taking: Reported on 04/15/2021)   hydrOXYzine (ATARAX/VISTARIL) 25 MG tablet Take 1 tablet (25 mg total) by mouth every 6 (six) hours as needed for itching. (Patient not taking: Reported on 04/15/2021)   tacrolimus (PROGRAF) 0.5 MG capsule 6 by mouth daily NAME BRAND ONLY   Facility-Administered Encounter Medications as of 04/22/2021  Medication   0.9 %  sodium chloride infusion    Patient Active Problem List   Diagnosis Date Noted   Intrinsic eczema 01/15/2021   Hyperlipidemia with target LDL less than 130 01/15/2021   Stage 3b chronic kidney disease ([place]) 01/15/2021  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   Asbestos exposure 04/08/2018   Hyperglycemia 04/08/2018   High risk medication use 04/08/2018   Vitamin D deficiency 04/08/2018   Essential hypertension 10/14/2017   Gastroesophageal reflux disease 10/14/2017   Ureteral stone 08/14/2016   Migraine 07/02/2015   Overweight (BMI 25.0-29.9) 06/28/2015   Chronic hepatitis C virus infection 05/31/2013   Seasonal  affective disorder ([place]) 11/14/2012   Allergic rhinitis 11/14/2012   Chronic hepatitis, unspecified ([place]) 11/14/2012   History of liver transplant 11/14/2012   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 03/26/2011   PCO (posterior capsular opacification), right 03/26/2011    Conditions to be addressed/monitored: Anxiety and Depression; Mental Health Concerns   Care Plan : LCSW Plan of Care  Updates made by Candace Peer, LCSW since 04/24/2021 12:00 AM     Problem: Emotional Distress      Long-Range Goal: Emotional Health Supported   Start Date: 04/14/2021  Expected End Date: 06/13/2021  This Visit's Progress: On track  Recent Progress: On track  Priority: High  Note:   Current Barriers:  Chronic Mental Health needs related to depression/anxiety Mental Health Concerns  Suicidal Ideation/Homicidal Ideation: No  Clinical Social Work Goal(s):  patient will work with SW  as needed  by telephone or in person to reduce or manage symptoms related to mental health needs demonstrate a reduction in symptoms related to :Anxiety with Excessive Worry, and Depression: depressed mood anxiety   Interventions:  11/8- Per pt, she was in the ER and dealing with her retina occlusion and has "been in the bed for the last week". Pt has not called to schedule her counseling appointment and is encouraged to do so. Pt admits to dealing with "SAD" in the winter months-   10/31-Pt shared with CSW that she has had anxiety for many years- stemming from childhood and worsened by liver transplant.  She also shared she is "losing one of my eyes" and this too impacts her mental state. Pt resides in a retirement community where she lives in independent living- she has not been able to connect socially much even though she is an extrovert because of medical appointments, etc.  Pt denies SI/HI. She was seeing a counselor but stopped going about 2 months ago- would like to pursue another  option. Pt drives some and prefers an Arboriculturist. CSW will assist pt with connecting with counselor options.  Patient interviewed and appropriate assessments performed: PHQ 2 PHQ 9 1:1 collaboration with Janith Lima, MD regarding development and update of comprehensive plan of care as evidenced by provider attestation and co-signature Patient interviewed and appropriate assessments performed Provided mental health counseling with regard to depression, anxiety  Discussed plans with patient for ongoing care management follow up and provided patient with direct contact information for care management team Collaborated with pt and counseling service re: appointment scheduling Depression screen reviewed  PHQ2/ PHQ9 completed Solution-Focused Strategies Active listening / Reflection utilized  Emotional Support Provided Provided psychoeducation for mental health needs  Reviewed mental health medications with patient and discussed importance of compliance:   Patient Self Care Activities:  Self administers medications as prescribed Attends all scheduled provider appointments Calls pharmacy for medication refills Attends church or other social activities Performs ADL's independently Calls provider office for new concerns or questions Ability for insight Independent living Motivation for treatment Strong family or social support   Patient Coping Strengths:  [place]  Self Advocate Able to Communicate Effectively  Patient Self Care Deficits:  Lacks social connections  Patient Goals:    -call and schedule initial counseling visit [facility] [phone] - check out counseling - keep 90 percent of counseling appointments - schedule counseling appointment  - avoid negative self-talk - develop a personal safety plan - develop a plan to deal with triggers like holidays,  anniversaries - have a plan for how to handle bad days - journal feelings and what helps to feel better or worse - spend time or talk with others at least 2 to 3 times per week - spend time or talk with others every day - watch for early signs of feeling worse - begin personal counseling - call and visit an old friend - check out volunteer opportunities - join a support group - start or continue a personal journal - talk about feelings with a friend, family or spiritual advisor - call to cancel if needed - keep a calendar with prescription refill dates - keep a calendar with appointment dates  Follow Up Plan: SW will follow up with patient by phone 05/26/21       Follow Up Plan: Appointment scheduled for SW follow up with client by phone on: 05/26/21      Eduard Clos MSW, [place] Licensed Clinical Social Worker [place] [phone]

## 2021-04-28 DIAGNOSIS — E612 Magnesium deficiency: Principal | ICD-10-CM

## 2021-04-28 DIAGNOSIS — Z5181 Encounter for therapeutic drug level monitoring: Principal | ICD-10-CM

## 2021-04-28 DIAGNOSIS — Z944 Liver transplant status: Principal | ICD-10-CM

## 2021-04-30 ENCOUNTER — Other Ambulatory Visit: Payer: Self-pay | Admitting: Internal Medicine

## 2021-04-30 ENCOUNTER — Telehealth: Payer: Self-pay | Admitting: Lab

## 2021-04-30 NOTE — Chronic Care Management (AMB) (Signed)
  Chronic Care Management   Outreach Note  04/30/2021 Name: Candace Hill MRN: [id number] DOB: 01-21-1946  Referred by: Janith Lima, MD Reason for referral : Medication Management   An unsuccessful telephone outreach was attempted today. The patient was referred to the pharmacist for assistance with care management and care coordination.   Follow Up Plan:   [place]

## 2021-05-02 NOTE — Unmapped (Signed)
[facility] Refill Coordination Note    Specialty Medication(s) to be Shipped:   Transplant: Prograf 0.5mg     Other medication(s) to be shipped: No additional medications requested for fill at this time     Kristy Hanson, DOB: Jul 25, 1945  Phone: [phone] (home)       All above HIPAA information was verified with patient.     Was a Nurse, learning disability used for this call? No    Completed refill call assessment today to schedule patient's medication shipment from the [facility] [phone]).  All relevant notes have been reviewed.     Specialty medication(s) and dose(s) confirmed: Regimen is correct and unchanged.   Changes to medications: Kristy Hanson reports no changes at this time.  Changes to insurance: No  New side effects reported not previously addressed with a pharmacist or physician: None reported  Questions for the pharmacist: No    Confirmed patient received a Conservation officer, historic buildings and a Surveyor, mining with first shipment. The patient will receive a drug information handout for each medication shipped and additional FDA Medication Guides as required.       DISEASE/MEDICATION-SPECIFIC INFORMATION        N/A    SPECIALTY MEDICATION ADHERENCE     Medication Adherence    Patient reported X missed doses in the last month: 0  Specialty Medication: prograf 0.5mg         Were doses missed due to medication being on hold? No    Prograf 0.5mg   : 10 days of medicine on hand       REFERRAL TO PHARMACIST     Referral to the pharmacist: Not needed      [facility]     Shipping address confirmed in Epic.     Delivery Scheduled: Yes, Expected medication delivery date: 05/07/2021.     Medication will be delivered via UPS to the prescription address in Epic WAM.    Kristy Hanson   [facility] Specialty Pharmacist

## 2021-05-05 DIAGNOSIS — E612 Magnesium deficiency: Principal | ICD-10-CM

## 2021-05-05 DIAGNOSIS — Z5181 Encounter for therapeutic drug level monitoring: Principal | ICD-10-CM

## 2021-05-05 DIAGNOSIS — Z944 Liver transplant status: Principal | ICD-10-CM

## 2021-05-06 MED FILL — PROGRAF 0.5 MG CAPSULE: ORAL | 30 days supply | Qty: 180 | Fill #2

## 2021-05-12 DIAGNOSIS — Z944 Liver transplant status: Principal | ICD-10-CM

## 2021-05-12 DIAGNOSIS — E612 Magnesium deficiency: Principal | ICD-10-CM

## 2021-05-12 DIAGNOSIS — Z5181 Encounter for therapeutic drug level monitoring: Principal | ICD-10-CM

## 2021-05-14 DIAGNOSIS — F338 Other recurrent depressive disorders: Secondary | ICD-10-CM

## 2021-05-14 DIAGNOSIS — I10 Essential (primary) hypertension: Secondary | ICD-10-CM

## 2021-05-14 DIAGNOSIS — F32A Depression, unspecified: Secondary | ICD-10-CM

## 2021-05-15 NOTE — Progress Notes (Signed)
Virtual Visit via telephone Note  I connected with Candace Hill on 05/15/21 at 10:00 AM EST by telephone and verified that I am speaking with the correct person using two identifiers.   I discussed the limitations of evaluation and management by telemedicine and the availability of in person appointments. The patient expressed understanding and agreed to proceed.  Present for the visit:  Myself, Dr Billey Gosling, Candace Hill.  The patient is currently at home and I am in the office.    No referring provider.    History of Present Illness: She is here for an acute visit for cold symptoms.   She is a liver transplant patient on immunosuppression.  She has a complicated history and she did review most of that with me today over the phone.  Her symptoms started 1 week ago  She is experiencing sore throat, lower temp than usual, nasal congestion, mild sinus pain, severe throat with swallowing, cough, a little wheeze, nausea, dizziness, headaches.  She is very fatigued.    She states she has been around a lot of people recently.  She has not tested herself for COVID.  She is not aware of anyone else she has been around having any specific virus/flu/COVID.  Review of Systems  Constitutional:  Positive for malaise/fatigue. Negative for fever (low temperature -97-98 - lower than usual).  HENT:  Positive for congestion, sinus pain (mild) and sore throat (severe with swallowing).   Respiratory:  Positive for cough and wheezing (a little). Negative for sputum production and shortness of breath.   Gastrointestinal:  Positive for nausea. Negative for diarrhea.  Neurological:  Positive for dizziness and headaches.     Social History   Socioeconomic History   Marital status: Divorced    Spouse name: Not on file   Number of children: 2   Years of education: 14   Highest education level: Associate degree: occupational, Hotel manager, or vocational program  Occupational History   Occupation:  Disabled  Tobacco Use   Smoking status: Never   Smokeless tobacco: Never  Vaping Use   Vaping Use: Never used  Substance and Sexual Activity   Alcohol use: No   Drug use: No   Sexual activity: Never    Comment: 14 years ago  Other Topics Concern   Not on file  Social History Narrative   Lives at alone alone.   Right-handed.   No caffeine use.      PSC- as of 10/12/17   Diet: No Fried Foods       Caffeine: Summer time, sweet tea       Married, if yes what year: Divorced       Do you live in a house, apartment, assisted living, condo, trailer, ect: [place], 1 stories, and 1 person       Pets: 1 dog       Current/Past profession: Retail buyer       Exercise: Some, walking dog       Living Will: yes   DNR: yes   POA/HPOA: yes      Functional Status: Patient did not answer   Do you have difficulty bathing or dressing yourself?   Do you have difficulty preparing food or eating?   Do you have difficulty managing your medications?   Do you have difficulty managing your finances?   Do you have difficulty affording your medications?   Social Determinants of Health   Financial Resource Strain: Low Risk    Difficulty  of Paying Living Expenses: Not very hard  Food Insecurity: No Food Insecurity   Worried About [place] in the Last Year: Never true   Ran Out of Food in the Last Year: Never true  Transportation Needs: No Transportation Needs   Lack of Transportation (Medical): No   Lack of Transportation (Non-Medical): No  Physical Activity: Insufficiently Active   Days of Exercise per Week: 1 day   Minutes of Exercise per Session: 10 min  Stress: Stress Concern Present   Feeling of Stress : Very much  Social Connections: Socially Isolated   Frequency of Communication with Friends and Family: Never   Frequency of Social Gatherings with Friends and Family: Never   Attends Religious Services: Never   Marine scientist or Organizations: No    Attends Music therapist: Never   Marital Status: Divorced     Assessment and Plan:  URI: Acute Symptoms started approximately 1 week ago She is a complicated patient with a complicated history that we did review together over the phone-most pertinent she is a liver transplant patient and on immunosuppression Unfortunately as we discussed we are very limited because this is a virtual visit and I cannot test her for COVID, flu, strep or other viruses, which would be ideal Discussed that if her symptoms are not getting better she needs to be seen over the weekend in urgent care to have further in person evaluation, especially given her immunocompromise status If this is viral she is out of the window for antiviral treatment Concern that this is bacterial --again this is very difficult to know for sure-we will start an antibiotic-doxycycline 100 mg twice daily x10 days Symptomatic treatment Rest, fluids   Again advised that if her symptoms are not improving she needs to be seen over the weekend     Follow Up Instructions:    I discussed the assessment and treatment plan with the patient. The patient was provided an opportunity to ask questions and all were answered. The patient agreed with the plan and demonstrated an understanding of the instructions.   The patient was advised to call back or seek an in-person evaluation if the symptoms worsen or if the condition fails to improve as anticipated.  Time spent on telephone call -25 minutes   Binnie Rail, MD

## 2021-05-16 ENCOUNTER — Telehealth (INDEPENDENT_AMBULATORY_CARE_PROVIDER_SITE_OTHER): Payer: Medicare Other | Admitting: Internal Medicine

## 2021-05-16 ENCOUNTER — Other Ambulatory Visit: Payer: Self-pay

## 2021-05-16 ENCOUNTER — Encounter: Payer: Self-pay | Admitting: Internal Medicine

## 2021-05-16 DIAGNOSIS — J069 Acute upper respiratory infection, unspecified: Secondary | ICD-10-CM | POA: Diagnosis not present

## 2021-05-16 MED ORDER — DOXYCYCLINE MONOHYDRATE 100 MG PO CAPS: 100.0000 mg | ORAL_CAPSULE | Freq: Two times a day (BID) | ORAL | 0 refills | Status: AC

## 2021-05-19 DIAGNOSIS — E612 Magnesium deficiency: Principal | ICD-10-CM

## 2021-05-19 DIAGNOSIS — Z5181 Encounter for therapeutic drug level monitoring: Principal | ICD-10-CM

## 2021-05-19 DIAGNOSIS — Z944 Liver transplant status: Principal | ICD-10-CM

## 2021-05-20 DIAGNOSIS — U071 COVID: Principal | ICD-10-CM

## 2021-05-20 DIAGNOSIS — D849 Immunodeficiency, unspecified: Principal | ICD-10-CM

## 2021-05-20 MED ORDER — MOLNUPIRAVIR 200 MG CAPSULE (EUA)
ORAL_CAPSULE | Freq: Two times a day (BID) | ORAL | 0 refills | 5 days | Status: CP
Start: 2021-05-20 — End: 2021-05-25

## 2021-05-20 NOTE — Unmapped (Signed)
Patient left VM for TNC requesting return call since she had a positive covid test. Contacted patient, who reported her sx began just before Thanksgiving, but she did not test until a few days ago. On VM, she said she planned to contact her pcp, but reports that she did not yet do this. She denies diarrhea, fever, or HA now, but does endorse fatigue and occ.sob. Difficult to get all information from patient, since she endorses depression and was focusing on family events that disturbed her. Obtained her daughter's number to confirm hx of infection and ability to go to pharmacy for medication for her. Reached out to ID provider as well for recommendation, since the onset of sx is outside of the therapeutics teams referral window.Per Dr.Lachiewicz and PharmD Christena Deem, patient should get molnupiravir.      Patient left another VM for TNC explaining that she was asleep during her earlier call with TNC and forgot to mention she was prescribed doxycyline by her pcp. Called contact number given to TNC by patient, but that number was not in service. Used old contact number in computer and spoke to April, who said she did not feel her mother's sx began before Thanksgiving, but her covid test was positive a couple days ago. She shared that her mother has been much more forgetful lately. Explained that txp providers were prescribing molnupiravir for her and that she should also take the doxy prescribed, which was okayed by PharmD Christena Deem, since patient may have a 2ndary bacterial infection if she has had covid longer than the last few days. Discussed importance of seeking urgent medical attn if patient develops sob or worsening sx. Daughter verbalized understanding and agreed to pickup med and take it to her mother.    Attempted to contact patient again, but she did not answer and her VM box was full. Returned call again and spoke with patient relaying recommendations. She verbalized understanding and said she had already spoken with her daughter about it. She agreed to repeat labs in a few weeks when she is feeling better.

## 2021-05-22 NOTE — Unmapped (Signed)
Received lengthy 4am VM from patient,explaining that she was told by mgmt in her retirement community ([facility] [place] [phone]) that they needed a fax request from the txp team stating that the patient should be quarantined for a month for covid. Contacted [facility] and spoke with head concierge, Tresa Endo, who verbalized that the patient was not clearly understanding the situation, that they did not need any formal letter stating she needed quarantining, since it is not mandatory. Contacted patient and explained that it was not necessary for them to have a letter re.her potentially being infectious for 21 days. Also, contacted daughter and explained this afterward. They verbalized understanding.

## 2021-05-26 ENCOUNTER — Ambulatory Visit (INDEPENDENT_AMBULATORY_CARE_PROVIDER_SITE_OTHER): Payer: Medicare Other | Admitting: *Deleted

## 2021-05-26 DIAGNOSIS — F32A Depression, unspecified: Secondary | ICD-10-CM

## 2021-05-26 DIAGNOSIS — F338 Other recurrent depressive disorders: Secondary | ICD-10-CM

## 2021-05-26 DIAGNOSIS — J069 Acute upper respiratory infection, unspecified: Secondary | ICD-10-CM

## 2021-05-26 DIAGNOSIS — E612 Magnesium deficiency: Principal | ICD-10-CM

## 2021-05-26 DIAGNOSIS — Z944 Liver transplant status: Principal | ICD-10-CM

## 2021-05-26 DIAGNOSIS — Z5181 Encounter for therapeutic drug level monitoring: Principal | ICD-10-CM

## 2021-05-26 DIAGNOSIS — H34812 Central retinal vein occlusion, left eye, with macular edema: Secondary | ICD-10-CM

## 2021-05-27 NOTE — Chronic Care Management (AMB) (Signed)
Chronic Care Management    Clinical Social Work Note  05/27/2021 Name: Candace Hill MRN: [id number] DOB: 02/18/1946  Candace Hill is a 75 y.o. year old female who is a primary care patient of Janith Lima, MD. The CCM team was consulted to assist the patient with chronic disease management and/or care coordination needs related to: [employer]  and [place] and Resources.   Engaged with patient by telephone for follow up visit in response to provider referral for social work chronic care management and care coordination services.   Consent to Services:  The patient was given information about Chronic Care Management services, agreed to services, and gave verbal consent prior to initiation of services.  Please see initial visit note for detailed documentation.   Patient agreed to services and consent obtained.   Assessment: Review of patient past medical history, allergies, medications, and health status, including review of relevant consultants reports was performed today as part of a comprehensive evaluation and provision of chronic care management and care coordination services.     SDOH (Social Determinants of Health) assessments and interventions performed:    Advanced Directives Status: Not addressed in this encounter.  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     Penicillins Nausea Only and Other (See Comments)    Has patient had a PCN reaction causing immediate rash, facial/tongue/throat swelling, SOB or lightheadedness with hypotension: No Has patient had a PCN reaction causing severe rash involving mucus membranes or skin necrosis: No Has patient had a PCN reaction that required hospitalization No Has patient had a PCN reaction occurring within the last 10 years: No If all of the above answers are "NO", then may proceed with  Cephalosporin use.   Phenergan [Promethazine] Other (See Comments)    Reaction:  Hallucinations  Pt states that she is only allergic to IV form.    Sulfa Antibiotics Itching   Tamsulosin Itching   Lidocaine Palpitations    Outpatient Encounter Medications as of 05/26/2021  Medication Sig   aspirin EC 81 MG tablet Take 81 mg by mouth daily.   atorvastatin (LIPITOR) 10 MG tablet TAKE ONE TABLET BY MOUTH ONCE DAILY FOR CHOLESTEROL.   Biotin 800 MCG TABS Take 1 tablet by mouth.   CALCIUM-VITAMIN D PO Take 600 mg by mouth daily.   [EXPIRED] doxycycline (MONODOX) 100 MG capsule Take 1 capsule (100 mg total) by mouth 2 (two) times daily for 10 days.   DULoxetine (CYMBALTA) 60 MG capsule TAKE 1 CAPSULE BY MOUTH EVERY DAY   Ferrous Sulfate (IRON) 325 (65 Fe) MG TABS Take 1 tablet by mouth daily.   fluocinonide cream (LIDEX) 3.41 % Apply 1 application topically 2 (two) times daily. (Patient not taking: Reported on 96/07/2295)   folic acid (FOLVITE) 989 MCG tablet Take 800 mcg by mouth daily.   GAVILYTE-G 236 g solution TAKE 4,000 ML BY MOUTH FOR 1 DOSE (Patient not taking: Reported on 04/15/2021)   hydrOXYzine (ATARAX/VISTARIL) 25 MG tablet Take 1 tablet (25 mg total) by mouth every 6 (six) hours as needed for itching. (Patient not taking: Reported on 04/15/2021)   tacrolimus (PROGRAF) 0.5 MG capsule 6 by mouth daily NAME BRAND ONLY   Facility-Administered Encounter Medications as of 05/26/2021  Medication   0.9 %  sodium chloride infusion    Patient Active Problem List   Diagnosis  Date Noted   Intrinsic eczema 01/15/2021   Hyperlipidemia with target LDL less than 130 01/15/2021   Stage 3b chronic kidney disease ([place]) 01/15/2021   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   Asbestos exposure 04/08/2018   Hyperglycemia 04/08/2018   High risk medication use 04/08/2018   Vitamin D deficiency 04/08/2018   Essential hypertension 10/14/2017   Gastroesophageal reflux disease 10/14/2017    Ureteral stone 08/14/2016   Migraine 07/02/2015   Overweight (BMI 25.0-29.9) 06/28/2015   Chronic hepatitis C virus infection 05/31/2013   Seasonal affective disorder ([place]) 11/14/2012   Allergic rhinitis 11/14/2012   Chronic hepatitis, unspecified ([place]) 11/14/2012   History of liver transplant 11/14/2012   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 03/26/2011   PCO (posterior capsular opacification), right 03/26/2011    Conditions to be addressed/monitored: Depression; Mental Health Concerns   Care Plan : LCSW Plan of Care  Updates made by Deirdre Peer, LCSW since 05/27/2021 12:00 AM     Problem: Emotional Distress      Long-Range Goal: Emotional Health Supported   Start Date: 04/14/2021  Expected End Date: 07/14/2021  This Visit's Progress: On track  Recent Progress: On track  Priority: High  Note:   Current Barriers:  Chronic Mental Health needs related to depression/anxiety Mental Health Concerns  Suicidal Ideation/Homicidal Ideation: No  Clinical Social Work Goal(s):  patient will work with SW  as needed  by telephone or in person to reduce or manage symptoms related to mental health needs demonstrate a reduction in symptoms related to :Anxiety with Excessive Worry, and Depression: depressed mood anxiety   Interventions:  05/26/21- Pt is now reporting she has been in the bed with COVID. She had to cancel her initial counseling session and will reschedule when she is feeling better,   11/8- Per pt, she was in the ER and dealing with her retina occlusion and has "been in the bed for the last week". Pt has not called to schedule her counseling appointment and is encouraged to do so. Pt admits to dealing with "SAD" in the winter months-   10/31-Pt shared with CSW that she has had anxiety for many years- stemming from childhood and worsened by liver transplant.  She also shared she is "losing one of my eyes" and this too impacts her mental state. Pt  resides in a retirement community where she lives in independent living- she has not been able to connect socially much even though she is an extrovert because of medical appointments, etc.  Pt denies SI/HI. She was seeing a counselor but stopped going about 2 months ago- would like to pursue another option. Pt drives some and prefers an Arboriculturist. CSW will assist pt with connecting with counselor options.  Patient interviewed and appropriate assessments performed: PHQ 2 PHQ 9 1:1 collaboration with Janith Lima, MD regarding development and update of comprehensive plan of care as evidenced by provider attestation and co-signature Patient interviewed and appropriate assessments performed Provided mental health counseling with regard to depression, anxiety  Discussed plans with patient for ongoing care management follow up and provided patient with direct contact information for care management team Collaborated with pt and counseling service re: appointment scheduling Depression screen reviewed  PHQ2/ PHQ9 completed Solution-Focused Strategies Active listening / Reflection utilized  Emotional Support Provided Provided psychoeducation for mental health needs  Reviewed mental health medications with patient and discussed importance of compliance:   Patient Self Care Activities:  Self administers medications  as prescribed Attends all scheduled provider appointments Calls pharmacy for medication refills Attends church or other social activities Performs ADL's independently Calls provider office for new concerns or questions Ability for insight Independent living Motivation for treatment Strong family or social support   Patient Coping Strengths:  Supportive [place] Spirituality Hopefulness Self Advocate Able to Communicate Effectively  Patient Self Care Deficits:  Lacks social connections  Patient Goals:     -call and schedule initial counseling visit [place] [phone] - check out counseling - keep 90 percent of counseling appointments - schedule counseling appointment  - avoid negative self-talk - develop a personal safety plan - develop a plan to deal with triggers like holidays, anniversaries - have a plan for how to handle bad days - journal feelings and what helps to feel better or worse - spend time or talk with others at least 2 to 3 times per week - spend time or talk with others every day - watch for early signs of feeling worse - begin personal counseling - call and visit an old friend - check out volunteer opportunities - join a support group - start or continue a personal journal - talk about feelings with a friend, family or spiritual advisor - call to cancel if needed - keep a calendar with prescription refill dates - keep a calendar with appointment dates  Follow Up Plan: SW will follow up with patient by phone 06/05/21       Follow Up Plan: Appointment scheduled for SW follow up with client by phone on: 06/05/21      Eduard Clos MSW, [place] Licensed Clinical Social Worker [place] [phone]

## 2021-05-27 NOTE — Patient Instructions (Signed)
Visit Information  Thank you for taking time to visit with me today. Please don't hesitate to contact me if I can be of assistance to you before our next scheduled telephone appointment.  Following are the goals we discussed today:  (Copy and paste patient goals from clinical care plan here)  Our next appointment is by telephone on 06/05/21 at 1:30PM  Please call the care guide team at [phone] if you need to cancel or reschedule your appointment.   If you are experiencing a Mental Health or [place] or need someone to talk to, please call the Suicide and Crisis Lifeline: 988 call the [place] National Suicide Prevention Lifeline: 559-573-8898 or TTY: (910)310-5949 TTY (985)525-4402) to talk to a trained counselor call 1-800-273-TALK (toll free, 24 hour hotline) go to [facility] [place] [phone]) call 911   The patient verbalized understanding of instructions, educational materials, and care plan provided today and declined offer to receive copy of patient instructions, educational materials, and care plan.  Eduard Clos MSW, LCSW Licensed Clinical Social Worker [facility] [place] [phone]

## 2021-06-02 DIAGNOSIS — Z796 Long-term use of immunosuppressant medication: Principal | ICD-10-CM

## 2021-06-02 DIAGNOSIS — Z944 Liver transplant status: Principal | ICD-10-CM

## 2021-06-02 DIAGNOSIS — Z5181 Encounter for therapeutic drug level monitoring: Principal | ICD-10-CM

## 2021-06-02 DIAGNOSIS — E612 Magnesium deficiency: Principal | ICD-10-CM

## 2021-06-02 MED ORDER — TACROLIMUS 0.5 MG CAPSULE, IMMEDIATE-RELEASE
ORAL_CAPSULE | Freq: Two times a day (BID) | ORAL | 2 refills | 30 days
Start: 2021-06-02 — End: ?

## 2021-06-02 NOTE — Unmapped (Signed)
[facility] Refill Coordination Note    Specialty Medication(s) to be Shipped:   Transplant: tacrolimus 0.5mg     Other medication(s) to be shipped: No additional medications requested for fill at this time     Kristy Hanson, DOB: Nov 20, 1945  Phone: [phone] (home)       All above HIPAA information was verified with patient.     Was a Nurse, learning disability used for this call? No    Completed refill call assessment today to schedule patient's medication shipment from the [facility] [phone]).  All relevant notes have been reviewed.     Specialty medication(s) and dose(s) confirmed: Regimen is correct and unchanged.   Changes to medications: Laelani reports no changes at this time.  Changes to insurance: No  New side effects reported not previously addressed with a pharmacist or physician: None reported  Questions for the pharmacist: No    Confirmed patient received a Conservation officer, historic buildings and a Surveyor, mining with first shipment. The patient will receive a drug information handout for each medication shipped and additional FDA Medication Guides as required.       DISEASE/MEDICATION-SPECIFIC INFORMATION        N/A    SPECIALTY MEDICATION ADHERENCE     Medication Adherence    Patient reported X missed doses in the last month: 0  Specialty Medication: Tacrolimus 0.5mg   Patient is on additional specialty medications: No  Informant: patient              Were doses missed due to medication being on hold? No    Tacrolimus 0.5 mg: 7 days of medicine on hand       REFERRAL TO PHARMACIST     Referral to the pharmacist: Not needed      [facility]     Shipping address confirmed in Epic.     Delivery Scheduled: Yes, Expected medication delivery date: 06/05/21.  However, Rx request for refills was sent to the provider as there are none remaining.     Medication will be delivered via UPS to the prescription address in Epic WAM.    Jasper Loser   [facility] Specialty Technician

## 2021-06-03 DIAGNOSIS — Z944 Liver transplant status: Principal | ICD-10-CM

## 2021-06-03 DIAGNOSIS — Z796 Long-term use of immunosuppressant medication: Principal | ICD-10-CM

## 2021-06-03 MED ORDER — TACROLIMUS 0.5 MG CAPSULE, IMMEDIATE-RELEASE
ORAL_CAPSULE | Freq: Two times a day (BID) | ORAL | 5 refills | 30 days | Status: CP
Start: 2021-06-03 — End: ?
  Filled 2021-06-04: qty 180, 30d supply, fill #0

## 2021-06-05 ENCOUNTER — Ambulatory Visit: Payer: Medicare Other | Admitting: *Deleted

## 2021-06-05 DIAGNOSIS — F338 Other recurrent depressive disorders: Secondary | ICD-10-CM

## 2021-06-05 DIAGNOSIS — F419 Anxiety disorder, unspecified: Secondary | ICD-10-CM

## 2021-06-05 DIAGNOSIS — H34812 Central retinal vein occlusion, left eye, with macular edema: Secondary | ICD-10-CM

## 2021-06-05 DIAGNOSIS — F32A Depression, unspecified: Secondary | ICD-10-CM

## 2021-06-05 NOTE — Patient Instructions (Signed)
Visit Information  Thank you for taking time to visit with me today. Please don't hesitate to contact me if I can be of assistance to you before our next scheduled telephone appointment.  Following are the goals we discussed today:  (Copy and paste patient goals from clinical care plan here)  Our next appointment is by telephone on 07/03/21 at 1045  Please call the care guide team at [phone] if you need to cancel or reschedule your appointment.   If you are experiencing a Mental Health or [place] or need someone to talk to, please call the Suicide and Crisis Lifeline: 988 call the [place] National Suicide Prevention Lifeline: 925 179 4408 or TTY: 762-093-1680 TTY 713-589-2424) to talk to a trained counselor call 1-800-273-TALK (toll free, 24 hour hotline) go to [facility] [place] [phone]) call 911   The patient verbalized understanding of instructions, educational materials, and care plan provided today and declined offer to receive copy of patient instructions, educational materials, and care plan.   Eduard Clos MSW, LCSW Licensed Clinical Social Worker [facility] [phone]

## 2021-06-05 NOTE — Chronic Care Management (AMB) (Signed)
Chronic Care Management    Clinical Social Work Note  06/05/2021 Name: Candace Hill MRN: [id number] DOB: 1946-02-02  Candace Hill is a 75 y.o. year old female who is a primary care patient of Janith Lima, MD. The CCM team was consulted to assist the patient with chronic disease management and/or care coordination needs related to: Mental Health Counseling and Resources.   Engaged with patient by telephone for follow up visit in response to provider referral for social work chronic care management and care coordination services.   Consent to Services:  The patient was given information about Chronic Care Management services, agreed to services, and gave verbal consent prior to initiation of services.  Please see initial visit note for detailed documentation.   Patient agreed to services and consent obtained.   Assessment: Review of patient past medical history, allergies, medications, and health status, including review of relevant consultants reports was performed today as part of a comprehensive evaluation and provision of chronic care management and care coordination services.     SDOH (Social Determinants of Health) assessments and interventions performed:    Advanced Directives Status: Not addressed in this encounter.  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     Penicillins Nausea Only and Other (See Comments)    Has patient had a PCN reaction causing immediate rash, facial/tongue/throat swelling, SOB or lightheadedness with hypotension: No Has patient had a PCN reaction causing severe rash involving mucus membranes or skin necrosis: No Has patient had a PCN reaction that required hospitalization No Has patient had a PCN reaction occurring within the last 10 years: No If all of the above answers are "NO", then may proceed with Cephalosporin use.   Phenergan  [Promethazine] Other (See Comments)    Reaction:  Hallucinations  Pt states that she is only allergic to IV form.    Sulfa Antibiotics Itching   Tamsulosin Itching   Lidocaine Palpitations    Outpatient Encounter Medications as of 06/05/2021  Medication Sig   aspirin EC 81 MG tablet Take 81 mg by mouth daily.   atorvastatin (LIPITOR) 10 MG tablet TAKE ONE TABLET BY MOUTH ONCE DAILY FOR CHOLESTEROL.   Biotin 800 MCG TABS Take 1 tablet by mouth.   CALCIUM-VITAMIN D PO Take 600 mg by mouth daily.   DULoxetine (CYMBALTA) 60 MG capsule TAKE 1 CAPSULE BY MOUTH EVERY DAY   Ferrous Sulfate (IRON) 325 (65 Fe) MG TABS Take 1 tablet by mouth daily.   fluocinonide cream (LIDEX) 4.48 % Apply 1 application topically 2 (two) times daily. (Patient not taking: Reported on 18/10/6312)   folic acid (FOLVITE) 970 MCG tablet Take 800 mcg by mouth daily.   GAVILYTE-G 236 g solution TAKE 4,000 ML BY MOUTH FOR 1 DOSE (Patient not taking: Reported on 04/15/2021)   hydrOXYzine (ATARAX/VISTARIL) 25 MG tablet Take 1 tablet (25 mg total) by mouth every 6 (six) hours as needed for itching. (Patient not taking: Reported on 04/15/2021)   tacrolimus (PROGRAF) 0.5 MG capsule 6 by mouth daily NAME BRAND ONLY   Facility-Administered Encounter Medications as of 06/05/2021  Medication   0.9 %  sodium chloride infusion    Patient Active Problem List   Diagnosis Date Noted   Intrinsic eczema 01/15/2021   Hyperlipidemia with target LDL less than 130 01/15/2021   Stage 3b chronic kidney disease ([place]) 01/15/2021  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   Asbestos exposure 04/08/2018   Hyperglycemia 04/08/2018   High risk medication use 04/08/2018   Vitamin D deficiency 04/08/2018   Essential hypertension 10/14/2017   Gastroesophageal reflux disease 10/14/2017   Ureteral stone 08/14/2016   Migraine 07/02/2015   Overweight (BMI 25.0-29.9) 06/28/2015   Chronic hepatitis C virus infection 05/31/2013   Seasonal  affective disorder ([place]) 11/14/2012   Allergic rhinitis 11/14/2012   Chronic hepatitis, unspecified ([place]) 11/14/2012   History of liver transplant 11/14/2012   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 03/26/2011   PCO (posterior capsular opacification), right 03/26/2011    Conditions to be addressed/monitored: Depression; Mental Health Concerns   Care Plan : LCSW Plan of Care  Updates made by Deirdre Peer, LCSW since 06/05/2021 12:00 AM     Problem: Emotional Distress      Long-Range Goal: Emotional Health Supported   Start Date: 04/14/2021  Expected End Date: 07/14/2021  This Visit's Progress: On track  Recent Progress: On track  Priority: High  Note:   Current Barriers:  Chronic Mental Health needs related to depression/anxiety Mental Health Concerns  Suicidal Ideation/Homicidal Ideation: No  Clinical Social Work Goal(s):  patient will work with SW  as needed  by telephone or in person to reduce or manage symptoms related to mental health needs demonstrate a reduction in symptoms related to :Anxiety with Excessive Worry, and Depression: depressed mood anxiety   Interventions:  06/05/21- Pt reporting she has been in the bed with COVID for a month now- "just got the ok to be out".  She has not rescheduled her initial counseling visit with [facility] and is encouraged to call today and get on the calendar for next month.  Pt plans to do so. She also is coordinating plans with her daughter for Christmas- now that she has been cleared to be out.   Pt with no SI/HI and denies any concerns; "I feel like I have slept and gotten well rested over the past month".    11/8- Per pt, she was in the ER and dealing with her retina occlusion and has "been in the bed for the last week". Pt has not called to schedule her counseling appointment and is encouraged to do so. Pt admits to dealing with "SAD" in the winter months-   10/31-Pt shared with CSW that  she has had anxiety for many years- stemming from childhood and worsened by liver transplant.  She also shared she is "losing one of my eyes" and this too impacts her mental state. Pt resides in a retirement community where she lives in independent living- she has not been able to connect socially much even though she is an extrovert because of medical appointments, etc.  Pt denies SI/HI. She was seeing a counselor but stopped going about 2 months ago- would like to pursue another option. Pt drives some and prefers an Arboriculturist. CSW will assist pt with connecting with counselor options.  Patient interviewed and appropriate assessments performed: PHQ 2 PHQ 9 1:1 collaboration with Janith Lima, MD regarding development and update of comprehensive plan of care as evidenced by provider attestation and co-signature Patient interviewed and appropriate assessments performed Provided mental health counseling with regard to depression, anxiety  Discussed plans with patient for ongoing care management follow up and provided patient with direct contact information for care management team Collaborated with pt and counseling service re: appointment scheduling Depression screen reviewed  PHQ2/  PHQ9 completed Solution-Focused Strategies Active listening / Reflection utilized  Emotional Support Provided Provided psychoeducation for mental health needs  Reviewed mental health medications with patient and discussed importance of compliance:   Patient Self Care Activities:  Self administers medications as prescribed Attends all scheduled provider appointments Calls pharmacy for medication refills Attends church or other social activities Performs ADL's independently Calls provider office for new concerns or questions Ability for insight Independent living Motivation for treatment Strong family or social support   Patient Coping Strengths:  Supportive  [place] Spirituality Hopefulness Self Advocate Able to Communicate Effectively  Patient Self Care Deficits:  Lacks social connections  Patient Goals:    -call and reschedule initial counseling visit [place] [phone] - check out counseling - keep 90 percent of counseling appointments - schedule counseling appointment  - avoid negative self-talk - develop a personal safety plan - develop a plan to deal with triggers like holidays, anniversaries - have a plan for how to handle bad days - journal feelings and what helps to feel better or worse - spend time or talk with others at least 2 to 3 times per week - spend time or talk with others every day - watch for early signs of feeling worse - begin personal counseling - call and visit an old friend - check out volunteer opportunities - join a support group - start or continue a personal journal - talk about feelings with a friend, family or spiritual advisor - call to cancel if needed - keep a calendar with prescription refill dates - keep a calendar with appointment dates  Follow Up Plan: SW will follow up with patient by phone 07/03/21       Follow Up Plan: Appointment scheduled for SW follow up with client by phone on: 07/03/21      Eduard Clos MSW, [place] Licensed Clinical Social Worker [place] [phone]

## 2021-06-05 NOTE — Unmapped (Signed)
Patient contacted [facility] to report she continues to test Covid + with her home covid tests, even though she has no sx. She inquired if she should take add'l antiviral medication. Explained that it is not necessary that she test for covid any longer or take any more medication, since she has no sx. Explained that txp patients are considered potentially contagious for 20 days post covid dx, but there is no need to quarantine after that point when she is sx free. Encouraged her to get labs drawn in the next couple weeks. She verbalized understanding.

## 2021-06-09 DIAGNOSIS — E612 Magnesium deficiency: Principal | ICD-10-CM

## 2021-06-09 DIAGNOSIS — Z944 Liver transplant status: Principal | ICD-10-CM

## 2021-06-09 DIAGNOSIS — Z5181 Encounter for therapeutic drug level monitoring: Principal | ICD-10-CM

## 2021-06-16 DIAGNOSIS — Z944 Liver transplant status: Principal | ICD-10-CM

## 2021-06-16 DIAGNOSIS — E612 Magnesium deficiency: Principal | ICD-10-CM

## 2021-06-16 DIAGNOSIS — Z5181 Encounter for therapeutic drug level monitoring: Principal | ICD-10-CM

## 2021-06-20 ENCOUNTER — Telehealth: Payer: Medicare Other

## 2021-06-22 ENCOUNTER — Other Ambulatory Visit: Payer: Self-pay

## 2021-06-22 DIAGNOSIS — I728 Aneurysm of other specified arteries: Secondary | ICD-10-CM

## 2021-06-23 DIAGNOSIS — E612 Magnesium deficiency: Principal | ICD-10-CM

## 2021-06-23 DIAGNOSIS — Z944 Liver transplant status: Principal | ICD-10-CM

## 2021-06-23 DIAGNOSIS — Z5181 Encounter for therapeutic drug level monitoring: Principal | ICD-10-CM

## 2021-06-24 ENCOUNTER — Telehealth: Payer: Self-pay | Admitting: *Deleted

## 2021-06-24 ENCOUNTER — Ambulatory Visit (INDEPENDENT_AMBULATORY_CARE_PROVIDER_SITE_OTHER): Payer: Medicare Other | Admitting: *Deleted

## 2021-06-24 DIAGNOSIS — F32A Depression, unspecified: Secondary | ICD-10-CM

## 2021-06-24 NOTE — Patient Instructions (Signed)
Visit Information  Thank you for taking time to visit with me today. Please don't hesitate to contact me if I can be of assistance to you Please call the care guide team at [phone] if you need to cancel or reschedule your appointment.   If you are experiencing a Mental Health or [place] or need someone to talk to, please call the Suicide and Crisis Lifeline: 988 go to [facility] Urgent [facility] [address] [place] [phone]) call 911   Patient verbalizes understanding of instructions provided today and agrees to view in [place].  Eduard Clos MSW, LCSW Licensed Clinical Social Worker [facility] [place] [phone]

## 2021-06-24 NOTE — Telephone Encounter (Signed)
°  Care Management   Follow Up Note   Late Entry 06/20/21    Name: Candace Hill MRN: [id number] DOB: 02/13/1946   Referred by: Janith Lima, MD Reason for referral : No chief complaint on file.   An unsuccessful telephone outreach was attempted today. The patient was referred to the case management team for assistance with care management and care coordination.   Follow Up Plan: Telephone follow up appointment with care management team member scheduled for:06/24/21  Eduard Clos MSW, LCSW Licensed Clinical Social Worker [place] [phone]

## 2021-06-24 NOTE — Chronic Care Management (AMB) (Signed)
Chronic Care Management    Clinical Social Work Note  06/24/2021 Name: Candace Hill MRN: [id number] DOB: 1946-03-11  Candace Hill is a 76 y.o. year old female who is a primary care patient of Janith Lima, MD. The CCM team was consulted to assist the patient with chronic disease management and/or care coordination needs related to: [employer]  and [place] and Resources.   Engaged with patient by telephone for follow up visit in response to provider referral for social work chronic care management and care coordination services.   Consent to Services:  The patient was given information about Chronic Care Management services, agreed to services, and gave verbal consent prior to initiation of services.  Please see initial visit note for detailed documentation.   Patient agreed to services and consent obtained.   Assessment: Review of patient past medical history, allergies, medications, and health status, including review of relevant consultants reports was performed today as part of a comprehensive evaluation and provision of chronic care management and care coordination services.     SDOH (Social Determinants of Health) assessments and interventions performed:    Advanced Directives Status: Not addressed in this encounter.  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     Penicillins Nausea Only and Other (See Comments)    Has patient had a PCN reaction causing immediate rash, facial/tongue/throat swelling, SOB or lightheadedness with hypotension: No Has patient had a PCN reaction causing severe rash involving mucus membranes or skin necrosis: No Has patient had a PCN reaction that required hospitalization No Has patient had a PCN reaction occurring within the last 10 years: No If all of the above answers are "NO", then may proceed with  Cephalosporin use.   Phenergan [Promethazine] Other (See Comments)    Reaction:  Hallucinations  Pt states that she is only allergic to IV form.    Sulfa Antibiotics Itching   Tamsulosin Itching   Lidocaine Palpitations    Outpatient Encounter Medications as of 06/24/2021  Medication Sig   aspirin EC 81 MG tablet Take 81 mg by mouth daily.   atorvastatin (LIPITOR) 10 MG tablet TAKE ONE TABLET BY MOUTH ONCE DAILY FOR CHOLESTEROL.   Biotin 800 MCG TABS Take 1 tablet by mouth.   CALCIUM-VITAMIN D PO Take 600 mg by mouth daily.   DULoxetine (CYMBALTA) 60 MG capsule TAKE 1 CAPSULE BY MOUTH EVERY DAY   Ferrous Sulfate (IRON) 325 (65 Fe) MG TABS Take 1 tablet by mouth daily.   fluocinonide cream (LIDEX) 8.92 % Apply 1 application topically 2 (two) times daily. (Patient not taking: Reported on 04/23/4173)   folic acid (FOLVITE) 081 MCG tablet Take 800 mcg by mouth daily.   GAVILYTE-G 236 g solution TAKE 4,000 ML BY MOUTH FOR 1 DOSE (Patient not taking: Reported on 04/15/2021)   hydrOXYzine (ATARAX/VISTARIL) 25 MG tablet Take 1 tablet (25 mg total) by mouth every 6 (six) hours as needed for itching. (Patient not taking: Reported on 04/15/2021)   tacrolimus (PROGRAF) 0.5 MG capsule 6 by mouth daily NAME BRAND ONLY   Facility-Administered Encounter Medications as of 06/24/2021  Medication   0.9 %  sodium chloride infusion    Patient Active Problem List   Diagnosis Date Noted   Intrinsic eczema 01/15/2021   Hyperlipidemia with target LDL less than 130 01/15/2021   Stage 3b chronic kidney  disease ([place]) 01/15/2021   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   Asbestos exposure 04/08/2018   Hyperglycemia 04/08/2018   High risk medication use 04/08/2018   Vitamin D deficiency 04/08/2018   Essential hypertension 10/14/2017   Gastroesophageal reflux disease 10/14/2017   Ureteral stone 08/14/2016   Migraine 07/02/2015   Overweight (BMI 25.0-29.9) 06/28/2015   Chronic hepatitis C virus  infection 05/31/2013   Seasonal affective disorder ([place]) 11/14/2012   Allergic rhinitis 11/14/2012   Chronic hepatitis, unspecified ([place]) 11/14/2012   History of liver transplant 11/14/2012   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 03/26/2011   PCO (posterior capsular opacification), right 03/26/2011    Conditions to be addressed/monitored: Depression; Mental Health Concerns   Care Plan : LCSW Plan of Care  Updates made by Deirdre Peer, LCSW since 06/24/2021 12:00 AM     Problem: Emotional Distress      Long-Range Goal: Emotional Health Supported Completed 06/24/2021  Start Date: 04/14/2021  Expected End Date: 07/14/2021  Recent Progress: On track  Priority: High  Note:   Current Barriers:  Chronic Mental Health needs related to depression/anxiety Mental Health Concerns  Suicidal Ideation/Homicidal Ideation: No  Clinical Social Work Goal(s):  patient will work with SW  as needed  by telephone or in person to reduce or manage symptoms related to mental health needs demonstrate a reduction in symptoms related to :Anxiety with Excessive Worry, and Depression: depressed mood anxiety   Interventions:   06/24/21- SPoke with pt who is plannign to call today to get scheduled with counseling at [facility]. Pt has the info/# and is aware of CSW plans to close referral - pt appreciative of CSW support and "diligence".   06/05/21- Pt reporting she has been in the bed with COVID for a month now- "just got the ok to be out".  She has not rescheduled her initial counseling visit with [facility] and is encouraged to call today and get on the calendar for next month.  Pt plans to do so. She also is coordinating plans with her daughter for Christmas- now that she has been cleared to be out.   Pt with no SI/HI and denies any concerns; "I feel like I have slept and gotten well rested over the past month".    11/8- Per pt, she was in the ER  and dealing with her retina occlusion and has "been in the bed for the last week". Pt has not called to schedule her counseling appointment and is encouraged to do so. Pt admits to dealing with "SAD" in the winter months-   10/31-Pt shared with CSW that she has had anxiety for many years- stemming from childhood and worsened by liver transplant.  She also shared she is "losing one of my eyes" and this too impacts her mental state. Pt resides in a retirement community where she lives in independent living- she has not been able to connect socially much even though she is an extrovert because of medical appointments, etc.  Pt denies SI/HI. She was seeing a counselor but stopped going about 2 months ago- would like to pursue another option. Pt drives some and prefers an Arboriculturist. CSW will assist pt with connecting with counselor options.  Patient interviewed and appropriate assessments performed: PHQ 2 PHQ 9 1:1 collaboration with Janith Lima, MD regarding development and update of comprehensive plan of care as evidenced by provider attestation and co-signature Patient interviewed and appropriate assessments performed Provided mental  health counseling with regard to depression, anxiety  Discussed plans with patient for ongoing care management follow up and provided patient with direct contact information for care management team Collaborated with pt and counseling service re: appointment scheduling Depression screen reviewed  PHQ2/ PHQ9 completed Solution-Focused Strategies Active listening / Reflection utilized  Emotional Support Provided Provided psychoeducation for mental health needs  Reviewed mental health medications with patient and discussed importance of compliance:   Patient Self Care Activities:  Self administers medications as prescribed Attends all scheduled provider appointments Calls pharmacy for medication refills Attends church or other social activities Performs  ADL's independently Calls provider office for new concerns or questions Ability for insight Independent living Motivation for treatment Strong family or social support   Patient Coping Strengths:  Supportive [place] Spirituality Hopefulness Self Advocate Able to Communicate Effectively  Patient Self Care Deficits:  Lacks social connections  Patient Goals:    -call and reschedule initial counseling visit [place] [phone] - check out counseling - keep 90 percent of counseling appointments - schedule counseling appointment  - avoid negative self-talk - develop a personal safety plan - develop a plan to deal with triggers like holidays, anniversaries - have a plan for how to handle bad days - journal feelings and what helps to feel better or worse - spend time or talk with others at least 2 to 3 times per week - spend time or talk with others every day - watch for early signs of feeling worse - begin personal counseling - call and visit an old friend - check out volunteer opportunities - join a support group - start or continue a personal journal - talk about feelings with a friend, family or spiritual advisor - call to cancel if needed - keep a calendar with prescription refill dates - keep a calendar with appointment dates  Follow Up Plan: N/A      Follow Up Plan: Client will ask PCP to reconsult CSW if needs arise      Eduard Clos MSW, LCSW Licensed Clinical Social Worker [place] [phone]

## 2021-06-25 ENCOUNTER — Telehealth: Payer: Self-pay | Admitting: Lab

## 2021-06-25 NOTE — Chronic Care Management (AMB) (Signed)
°  Chronic Care Management   Outreach Note  06/25/2021 Name: Candace Hill MRN: [id number] DOB: [birth year]  Referred by: Janith Lima, MD Reason for referral : Medication Management   A second unsuccessful telephone outreach was attempted today. The patient was referred to pharmacist for assistance with care management and care coordination.  Follow Up Plan:   [name]

## 2021-06-26 NOTE — Unmapped (Signed)
[facility] Refill Coordination Note    Specialty Medication(s) to be Shipped:   Transplant: Prograf 0.5mg     Other medication(s) to be shipped: No additional medications requested for fill at this time     Kristy Hanson, DOB: 30-Nov-1945  Phone: [phone] (home)       All above HIPAA information was verified with patient.     Was a Nurse, learning disability used for this call? No    Completed refill call assessment today to schedule patient's medication shipment from the [facility] [phone]).  All relevant notes have been reviewed.     Specialty medication(s) and dose(s) confirmed: Regimen is correct and unchanged.   Changes to medications: Kristy Hanson reports no changes at this time.  Changes to insurance: No  New side effects reported not previously addressed with a pharmacist or physician: None reported  Questions for the pharmacist: No    Confirmed patient received a Conservation officer, historic buildings and a Surveyor, mining with first shipment. The patient will receive a drug information handout for each medication shipped and additional FDA Medication Guides as required.       DISEASE/MEDICATION-SPECIFIC INFORMATION        N/A    SPECIALTY MEDICATION ADHERENCE     Medication Adherence    Patient reported X missed doses in the last month: 0  Specialty Medication: Prograf 0.5mg   Patient is on additional specialty medications: No        Were doses missed due to medication being on hold? No    Prograf 0.5 mg: 10 days of medicine on hand     REFERRAL TO PHARMACIST     Referral to the pharmacist: Not needed      [facility]     Shipping address confirmed in Epic.     Delivery Scheduled: Yes, Expected medication delivery date: 07/02/2021.     Medication will be delivered via UPS to the prescription address in Epic WAM.    Kristy Hanson [facility] Specialty Technician

## 2021-06-30 DIAGNOSIS — Z944 Liver transplant status: Principal | ICD-10-CM

## 2021-06-30 DIAGNOSIS — E612 Magnesium deficiency: Principal | ICD-10-CM

## 2021-06-30 DIAGNOSIS — Z5181 Encounter for therapeutic drug level monitoring: Principal | ICD-10-CM

## 2021-06-30 NOTE — Progress Notes (Signed)
NEUROLOGY  NOTE  Candace Hill MRN: [id number] DOB: July 13, 1945  Referring provider: Charmaine Downs, PA-C (ED referral) Primary care provider: Scarlette Calico, MD  Reason for consult:  visual disturbance  Assessment/Plan:   Transient left ocular pain - etiology unclear.  MRI of brain unremarkable.  I would really defer to ophthalmology for any further evaluation. Left central retinal vein occlusion, stable  Follow up with [name] as needed.   Subjective:  Candace Hill is a 76 year old female with cataract, asthma, HTN, chronic hepatitis C s/p liver transplant with history of TIAs and left central retinal vein occlusion who is a patient of [name] following up for visual disturbance.  The referral was for visual disturbance but her complaint is really about an episode of ocular pain.  Patient has some vision loss in the left eye due to central retinal vein occlusion sustained about 3 years ago.  Around 04/13/2021, she woke up in the middle of the night with painful left ocular pain.  Felt like she was hit in the eye.  Periorbital region felt swollen.  Symptoms lasted 30 minutes.  No new change in vision.  She followed up with her ophthalmologist the next day who told her that her eye exam was stable.  She presented to the ED on 04/15/2021 for further evaluation.  IOP was normal.  MRI of brain personally reviewed showed chronic small vessel ischemic changes within the cerebral white matter but no acute abnormalities.  Sed rate and CRP were 22 and 0.6 respectively.  She was referred to neurology.     PAST MEDICAL HISTORY: Past Medical History:  Diagnosis Date   Allergic rhinitis 11/14/2012   Amblyopia of eye, left 06/17/2017   Asbestos exposure 04/08/2018   Asthma    1994-1994 due to black mold house, no asthma now   Broken ankle    right   Broken rib    x2   Cataract    Central retinal vein occlusion    left eye retinal occlusion   Chronic hepatitis C virus infection  40/98/1191   Complication of anesthesia    IV phenergan  serious mental reaction   Depression    Essential hypertension 10/14/2017   Gastroesophageal reflux disease 10/14/2017   Hernia, femoral    left   History of blood transfusion 1971   acquired hepatitis c   History of kidney stones    History of liver transplant 11/14/2012   History of nephrolithiasis 10/14/2017   History of renal stone    History of shingles    Hyperglycemia 04/08/2018   Malignant neoplasm of liver 11/14/2012   Migraine 07/02/2015   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   Seasonal affective disorder    Splenic artery aneurysm (HCC)    Tailbone injury age 12 and age 89 and few weeks ago   broken x3   TIA (transient ischemic attack)    x3   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-will get Avastin injections off/on   Vitamin D deficiency 04/08/2018    PAST SURGICAL HISTORY: Past Surgical History:  Procedure Laterality Date   CATARACT EXTRACTION Bilateral    CHOLECYSTECTOMY  1997   CYSTOSCOPY W/ URETERAL STENT PLACEMENT Left 08/14/2016   Procedure: CYSTOSCOPY WITH RETROGRADE PYELOGRAM/URETERAL LEFT URETEROSCOPY AND LEFT STENT PLACEMENT;  Surgeon: Raynelle Bring, MD;  Location: [facility];  Service: Urology;  Laterality: Left;   CYSTOSCOPY/URETEROSCOPY/HOLMIUM LASER/STENT PLACEMENT Left 07/06/2016   Procedure: CYSTOSCOPY/URETEROSCOPY/ BILATERAL RETROGRADE/HOLMIUM LASER/STENT PLACEMENT/BASKET  STONE REMOVAL;  Surgeon: Raynelle Bring, MD;  Location: [facility];  Service: Urology;  Laterality: Left;   CYSTOSCOPY/URETEROSCOPY/HOLMIUM LASER/STENT PLACEMENT Left 08/06/2016   Procedure: CYSTOSCOPY/URETEROSCOPY/HOLMIUM LASER/STENT PLACEMENT;  Surgeon: Raynelle Bring, MD;  Location: [facility];  Service: Urology;  Laterality: Left;   CYSTOSCOPY/URETEROSCOPY/HOLMIUM LASER/STENT PLACEMENT Left 08/31/2016   Procedure: CYSTOSCOPY/URETEROSCOPY/HOLMIUM LASER/STENT PLACEMENT/BASKET STONE REMOVAL;  Surgeon:  Raynelle Bring, MD;  Location: [facility];  Service: Urology;  Laterality: Left;   FEMORAL HERNIA REPAIR Left 1997   left leg-   LIVER TRANSPLANTATION     NASAL SINUS SURGERY  1997   x3   TONSILLECTOMY  age 75   TUBAL LIGATION      MEDICATIONS: Current Outpatient Medications on File Prior to Visit  Medication Sig Dispense Refill   aspirin EC 81 MG tablet Take 81 mg by mouth daily.     atorvastatin (LIPITOR) 10 MG tablet TAKE ONE TABLET BY MOUTH ONCE DAILY FOR CHOLESTEROL. 90 tablet 0   Biotin 800 MCG TABS Take 1 tablet by mouth.     CALCIUM-VITAMIN D PO Take 600 mg by mouth daily.     DULoxetine (CYMBALTA) 60 MG capsule TAKE 1 CAPSULE BY MOUTH EVERY DAY 90 capsule 0   Ferrous Sulfate (IRON) 325 (65 Fe) MG TABS Take 1 tablet by mouth daily.     fluocinonide cream (LIDEX) 9.21 % Apply 1 application topically 2 (two) times daily. (Patient not taking: Reported on 19/09/1738) 60 g 2   folic acid (FOLVITE) 814 MCG tablet Take 800 mcg by mouth daily.     GAVILYTE-G 236 g solution TAKE 4,000 ML BY MOUTH FOR 1 DOSE (Patient not taking: Reported on 04/15/2021) 4000 mL 0   hydrOXYzine (ATARAX/VISTARIL) 25 MG tablet Take 1 tablet (25 mg total) by mouth every 6 (six) hours as needed for itching. 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     Penicillins Nausea Only and Other (See Comments)    Has patient had a PCN reaction causing immediate rash, facial/tongue/throat swelling, SOB or lightheadedness with hypotension: No Has patient had a PCN reaction  causing severe rash involving mucus membranes or skin necrosis: No Has patient had a PCN reaction that required hospitalization No Has patient had a PCN reaction occurring within the last 10 years: No If all of the above answers are "NO", then may proceed with Cephalosporin use.   Phenergan [Promethazine] Other (See Comments)    Reaction:  Hallucinations  Pt states that she is only allergic to IV form.    Sulfa Antibiotics Itching   Tamsulosin Itching   Lidocaine Palpitations    FAMILY HISTORY: Family History  Adopted: Yes  Problem Relation Age of Onset   Stroke Mother    Alzheimer's disease Mother    Cancer Mother    Diabetes Mother        Questionable, per new patient packet    Arthritis Mother        Questionable, per new patient packet  Alcoholism Father    Suicidality Sister    Depression Sister    COPD Sister    COPD Brother        Heavy smoker, per new patient packet    OCD Daughter    Personality disorder Daughter    Alcoholism Son    Bipolar disorder Granddaughter        Age 42 as of 07/10/2020   Schizophrenia Granddaughter     Objective:  Blood pressure (!) 142/83, pulse 97, height 5\' 3"  (1.6 m), weight 142 lb 12.8 oz (64.8 kg), SpO2 96 %. General: No acute distress.  Patient appears well-groomed.   Head:  Normocephalic/atraumatic Eyes:  fundi examined but not visualized Neck: supple, no paraspinal tenderness, full range of motion Back: No paraspinal tenderness Heart: regular rate and rhythm Lungs: Clear to auscultation bilaterally. Vascular: No carotid bruits. Neurological Exam: Mental status: alert and oriented to person, place, and time, recent and remote memory intact, fund of knowledge intact, attention and concentration intact, speech fluent and not dysarthric, language intact. Cranial nerves: CN I: not tested CN II: pupils equal, round and reactive to light, vision loss in left eye. CN III, IV, VI:  full range of motion, no nystagmus, no  ptosis CN V: facial sensation intact. CN VII: upper and lower face symmetric CN VIII: hearing intact CN IX, X: gag intact, uvula midline CN XI: sternocleidomastoid and trapezius muscles intact CN XII: tongue midline Bulk & Tone: normal, no fasciculations. Motor:  muscle strength 5/5 throughout Sensation:  Pinprick, temperature and vibratory sensation intact. Deep Tendon Reflexes:  2+ throughout,  toes downgoing.   Finger to nose testing:  Without dysmetria.   Heel to shin:  Without dysmetria.   Gait:  Normal station and stride.  Romberg negative.    Thank you for allowing me to take part in the care of this patient.  Metta Clines, DO  CC:  Scarlette Calico, MD

## 2021-07-01 ENCOUNTER — Encounter: Payer: Self-pay | Admitting: Neurology

## 2021-07-01 ENCOUNTER — Other Ambulatory Visit: Payer: Self-pay

## 2021-07-01 ENCOUNTER — Ambulatory Visit (INDEPENDENT_AMBULATORY_CARE_PROVIDER_SITE_OTHER): Payer: Medicare Other | Admitting: Neurology

## 2021-07-01 VITALS — BP 142/83 | HR 97 | Ht 63.0 in | Wt 142.8 lb

## 2021-07-01 DIAGNOSIS — H5712 Ocular pain, left eye: Secondary | ICD-10-CM | POA: Diagnosis not present

## 2021-07-01 MED FILL — PROGRAF 0.5 MG CAPSULE: ORAL | 30 days supply | Qty: 180 | Fill #1

## 2021-07-01 NOTE — Patient Instructions (Signed)
I would follow up with the eye doctor

## 2021-07-03 NOTE — Unmapped (Signed)
Called pt to schedule annual appt. Pt immediately said she doesn't get annuals at [facility] because she will not come to [facility] because of [employer] and that she usually has to have a biopsy and doesn't want to do that. This tpa reassured pt that she has the option of a virtual appt so she does not have to come to [facility] and shared new post liver annual protocol, that she only needs labs and a virtual clinic appt since she's stable. She agreed with that and is scheduled for virtual telephone call 07/23/21 with NP since she only has landline and doesn't have computers or a cell phone and will get labs week before at [facility]. Reminded her what to do if issue when she's at [facility] and gave her on call # to call if needed as well as name of NP, all of which she was writing down. Pt said she would like appt letter. Verified address. Then pt discussed other health issues - said she recently had aneurysm in groin. Listened and supported pt. She verbalized understanding of all discussed.    Appt letter sent thru Epic mail

## 2021-07-07 DIAGNOSIS — E612 Magnesium deficiency: Principal | ICD-10-CM

## 2021-07-07 DIAGNOSIS — Z5181 Encounter for therapeutic drug level monitoring: Principal | ICD-10-CM

## 2021-07-07 DIAGNOSIS — Z944 Liver transplant status: Principal | ICD-10-CM

## 2021-07-08 DIAGNOSIS — F4320 Adjustment disorder, unspecified: Secondary | ICD-10-CM | POA: Diagnosis not present

## 2021-07-14 ENCOUNTER — Encounter: Payer: Self-pay | Admitting: Nurse Practitioner

## 2021-07-14 ENCOUNTER — Telehealth (INDEPENDENT_AMBULATORY_CARE_PROVIDER_SITE_OTHER): Payer: Medicare Other | Admitting: Nurse Practitioner

## 2021-07-14 ENCOUNTER — Other Ambulatory Visit: Payer: Self-pay

## 2021-07-14 DIAGNOSIS — J069 Acute upper respiratory infection, unspecified: Secondary | ICD-10-CM | POA: Insufficient documentation

## 2021-07-14 DIAGNOSIS — E612 Magnesium deficiency: Principal | ICD-10-CM

## 2021-07-14 DIAGNOSIS — Z5181 Encounter for therapeutic drug level monitoring: Principal | ICD-10-CM

## 2021-07-14 DIAGNOSIS — Z944 Liver transplant status: Principal | ICD-10-CM

## 2021-07-14 MED ORDER — DOXYCYCLINE HYCLATE 100 MG PO TABS: 100.0000 mg | ORAL_TABLET | Freq: Two times a day (BID) | ORAL | 0 refills | Status: AC

## 2021-07-14 NOTE — Progress Notes (Signed)
Patient ID: [facility], female    DOB: November 23, 1945, 76 y.o.   MRN: [id number]  Virtual visit completed through [employer], a video enabled telemedicine application. Due to national recommendations of social distancing due to COVID-19, a virtual visit is felt to be most appropriate for this patient at this time. Reviewed limitations, risks, security and privacy concerns of performing a virtual visit and the availability of in person appointments. I also reviewed that there may be a patient responsible charge related to this service. The patient agreed to proceed.   Attempted to connect via video enabled device, was unsuccesful, reverted to telephone encounter  Phone call lasted 19 mins and 45 sec  Patient location: home Provider location: [place] at [facility], office Persons participating in this virtual visit: patient, provider   If any vitals were documented, they were collected by patient at home unless specified below.    There were no vitals taken for this visit.   CC: cough Subjective:   HPI: [facility] is a 76 y.o. female presenting on 07/14/2021 for Cough (And chest congestion, since 07/11/21. No fever. ) and Melena (Patient states after taking Mucinnex one tablet she developed abdominal pain and had runny black/tarry stool. She did not take anymore of this and has not had any more problem with this.)  Symptoms started on 07/11/2021 Has not had covid  tested Moderna x2  Flu vaccine per patient report Lives [facility] in [place]. No sick contacts that she is aware of. Tried mucinex and developed abdominal pain and black/tarry stool. States some improvement with discontinuation of the mucinex. Patient is also on iron pills Symptoms worsened since they started. States that she had covid in December (was tested by her facility). States she quarantined the entire month due to recommendations since she is on immune suppressing medications      Relevant past medical,  surgical, family and social history reviewed and updated as indicated. Interim medical history since our last visit reviewed. Allergies and medications reviewed and updated. Outpatient Medications Prior to Visit  Medication Sig Dispense Refill   aspirin EC 81 MG tablet Take 81 mg by mouth daily.     Biotin 800 MCG TABS Take 1 tablet by mouth.     CALCIUM-VITAMIN D PO Take 600 mg by mouth daily.     DULoxetine (CYMBALTA) 60 MG capsule TAKE 1 CAPSULE BY MOUTH EVERY DAY 90 capsule 0   Ferrous Sulfate (IRON) 325 (65 Fe) MG TABS Take 1 tablet by mouth daily.     tacrolimus (PROGRAF) 0.5 MG capsule 6 by mouth daily NAME BRAND ONLY     atorvastatin (LIPITOR) 10 MG tablet TAKE ONE TABLET BY MOUTH ONCE DAILY FOR CHOLESTEROL. (Patient not taking: Reported on 07/14/2021) 90 tablet 0   folic acid (FOLVITE) 270 MCG tablet Take 800 mcg by mouth daily.     fluocinonide cream (LIDEX) 6.23 % Apply 1 application topically 2 (two) times daily. 60 g 2   GAVILYTE-G 236 g solution TAKE 4,000 ML BY MOUTH FOR 1 DOSE 4000 mL 0   hydrOXYzine (ATARAX/VISTARIL) 25 MG tablet Take 1 tablet (25 mg total) by mouth every 6 (six) hours as needed for itching. 30 tablet 0   Facility-Administered Medications Prior to Visit  Medication Dose Route Frequency Provider Last Rate Last Admin   0.9 %  sodium chloride infusion  500 mL Intravenous Once Nelida Meuse III, MD         Per HPI unless specifically indicated in  ROS section below Review of Systems  Constitutional:  Positive for appetite change and fatigue (covid). Negative for chills and fever.  HENT:  Positive for congestion. Negative for ear discharge, ear pain, sinus pressure, sinus pain and sore throat.   Respiratory:  Positive for cough (greenish/yellow). Negative for shortness of breath.   Cardiovascular:  Negative for chest pain.  Gastrointestinal:  Positive for blood in stool. Negative for abdominal pain, diarrhea, nausea and vomiting.  Musculoskeletal:  Negative for  arthralgias and myalgias.  Neurological:  Positive for headaches.  Objective:  There were no vitals taken for this visit.  Wt Readings from Last 3 Encounters:  07/01/21 142 lb 12.8 oz (64.8 kg)  04/15/21 154 lb (69.9 kg)  01/20/21 149 lb (67.6 kg)       Physical exam: Gen: alert, NAD, not ill appearing Pulm: speaks in complete sentences without increased work of breathing Psych: normal mood, normal thought content      Results for orders placed or performed during the hospital encounter of 04/15/21  Resp Panel by RT-PCR (Flu A&B, Covid) Nasopharyngeal Swab   Specimen: Nasopharyngeal Swab; Nasopharyngeal(NP) swabs in vial transport medium  Result Value Ref Range   SARS Coronavirus 2 by RT PCR NEGATIVE NEGATIVE   Influenza A by PCR NEGATIVE NEGATIVE   Influenza B by PCR NEGATIVE NEGATIVE  Ethanol  Result Value Ref Range   Alcohol, Ethyl (B) <10 <10 mg/dL  Protime-INR  Result Value Ref Range   Prothrombin Time 12.4 11.4 - 15.2 seconds   INR 0.9 0.8 - 1.2  APTT  Result Value Ref Range   aPTT 40 (H) 24 - 36 seconds  CBC  Result Value Ref Range   WBC 8.9 4.0 - 10.5 K/uL   RBC 4.47 3.87 - 5.11 MIL/uL   Hemoglobin 13.7 12.0 - 15.0 g/dL   HCT 39.4 36.0 - 46.0 %   MCV 88.1 80.0 - 100.0 fL   MCH 30.6 26.0 - 34.0 pg   MCHC 34.8 30.0 - 36.0 g/dL   RDW 13.0 11.5 - 15.5 %   Platelets 185 150 - 400 K/uL   nRBC 0.0 0.0 - 0.2 %  Differential  Result Value Ref Range   Neutrophils Relative % 65 %   Neutro Abs 5.9 1.7 - 7.7 K/uL   Lymphocytes Relative 24 %   Lymphs Abs 2.1 0.7 - 4.0 K/uL   Monocytes Relative 7 %   Monocytes Absolute 0.6 0.1 - 1.0 K/uL   Eosinophils Relative 2 %   Eosinophils Absolute 0.2 0.0 - 0.5 K/uL   Basophils Relative 1 %   Basophils Absolute 0.1 0.0 - 0.1 K/uL   Immature Granulocytes 1 %   Abs Immature Granulocytes 0.04 0.00 - 0.07 K/uL  Comprehensive metabolic panel  Result Value Ref Range   Sodium 141 135 - 145 mmol/L   Potassium 3.3 (L) 3.5 - 5.1  mmol/L   Chloride 103 98 - 111 mmol/L   CO2 29 22 - 32 mmol/L   Glucose, Bld 98 70 - 99 mg/dL   BUN 14 8 - 23 mg/dL   Creatinine, Ser 1.01 (H) 0.44 - 1.00 mg/dL   Calcium 9.0 8.9 - 10.3 mg/dL   Total Protein 7.3 6.5 - 8.1 g/dL   Albumin 4.4 3.5 - 5.0 g/dL   AST 15 15 - 41 U/L   ALT 11 0 - 44 U/L   Alkaline Phosphatase 49 38 - 126 U/L   Total Bilirubin 0.7 0.3 - 1.2 mg/dL  GFR, Estimated 59 (L) >60 mL/min   Anion gap 9 5 - 15  Urine rapid drug screen (hosp performed)  Result Value Ref Range   Opiates NONE DETECTED NONE DETECTED   Cocaine NONE DETECTED NONE DETECTED   Benzodiazepines NONE DETECTED NONE DETECTED   Amphetamines NONE DETECTED NONE DETECTED   Tetrahydrocannabinol NONE DETECTED NONE DETECTED   Barbiturates NONE DETECTED NONE DETECTED  Urinalysis, Routine w reflex microscopic  Result Value Ref Range   Color, Urine YELLOW YELLOW   APPearance CLEAR CLEAR   Specific Gravity, Urine 1.012 1.005 - 1.030   pH 6.5 5.0 - 8.0   Glucose, UA NEGATIVE NEGATIVE mg/dL   Hgb urine dipstick NEGATIVE NEGATIVE   Bilirubin Urine NEGATIVE NEGATIVE   Ketones, ur NEGATIVE NEGATIVE mg/dL   Protein, ur NEGATIVE NEGATIVE mg/dL   Nitrite NEGATIVE NEGATIVE   Leukocytes,Ua SMALL (A) NEGATIVE   RBC / HPF 0-5 0 - 5 RBC/hpf   WBC, UA 0-5 0 - 5 WBC/hpf   Mucus PRESENT    Non Squamous Epithelial 0-5 (A) NONE SEEN  Sedimentation rate  Result Value Ref Range   Sed Rate 22 0 - 22 mm/hr  C-reactive protein  Result Value Ref Range   CRP 0.6 <1.0 mg/dL   Assessment & Plan:   Problem List Items Addressed This Visit       Respiratory   Upper respiratory tract infection - Primary    Patient is medically complex on immune suppressing medications from a history of liver transplant.  Given that she recently recovered from [place] the likelihood of reinfection is low and she forewent COVID testing.  We will send in a wasp prescription of doxycycline for patient she is given instructions to wait 2 more  days to see how her symptoms fair and if she does not start improving she can go ahead and start medication due to her being on Prograf.  Patient is on iron and stool improved after continue Mucinex.  Continue to not take Mucinex.  She will need to follow-up with primary care provider if no improvement for an office evaluation.  Signs and symptoms reviewed to seek urgent or emergent health care.      Relevant Medications   doxycycline (VIBRA-TABS) 100 MG tablet     Meds ordered this encounter  Medications   doxycycline (VIBRA-TABS) 100 MG tablet    Sig: Take 1 tablet (100 mg total) by mouth 2 (two) times daily for 7 days.    Dispense:  14 tablet    Refill:  0    Order Specific Question:   Supervising Provider    Answer:   TOWER, MARNE A [[id number]]   No orders of the defined types were placed in this encounter.   I discussed the assessment and treatment plan with the patient. The patient was provided an opportunity to ask questions and all were answered. The patient agreed with the plan and demonstrated an understanding of the instructions. The patient was advised to call back or seek an in-person evaluation if the symptoms worsen or if the condition fails to improve as anticipated.  Follow up plan: No follow-ups on file.  Romilda Garret, NP

## 2021-07-14 NOTE — Assessment & Plan Note (Signed)
Patient is medically complex on immune suppressing medications from a history of liver transplant.  Given that she recently recovered from [place] the likelihood of reinfection is low and she forewent COVID testing.  We will send in a wasp prescription of doxycycline for patient she is given instructions to wait 2 more days to see how her symptoms fair and if she does not start improving she can go ahead and start medication due to her being on Prograf.  Patient is on iron and stool improved after continue Mucinex.  Continue to not take Mucinex.  She will need to follow-up with primary care provider if no improvement for an office evaluation.  Signs and symptoms reviewed to seek urgent or emergent health care.

## 2021-07-16 DIAGNOSIS — Z944 Liver transplant status: Secondary | ICD-10-CM | POA: Diagnosis not present

## 2021-07-16 DIAGNOSIS — Z5181 Encounter for therapeutic drug level monitoring: Secondary | ICD-10-CM | POA: Diagnosis not present

## 2021-07-16 DIAGNOSIS — E612 Magnesium deficiency: Secondary | ICD-10-CM | POA: Diagnosis not present

## 2021-07-17 ENCOUNTER — Ambulatory Visit: Payer: Medicare Other | Admitting: Neurology

## 2021-07-17 ENCOUNTER — Telehealth: Payer: Self-pay | Admitting: Nurse Practitioner

## 2021-07-17 LAB — CBC W/ DIFFERENTIAL
BANDED NEUTROPHILS ABSOLUTE COUNT: 0 10*3/uL (ref 0.0–0.1)
BASOPHILS ABSOLUTE COUNT: 0 10*3/uL (ref 0.0–0.2)
BASOPHILS RELATIVE PERCENT: 1 %
EOSINOPHILS ABSOLUTE COUNT: 0.2 10*3/uL (ref 0.0–0.4)
EOSINOPHILS RELATIVE PERCENT: 3 %
HEMATOCRIT: 38.1 % (ref 34.0–46.6)
HEMOGLOBIN: 13.3 g/dL (ref 11.1–15.9)
IMMATURE GRANULOCYTES: 0 %
LYMPHOCYTES ABSOLUTE COUNT: 1.6 10*3/uL (ref 0.7–3.1)
LYMPHOCYTES RELATIVE PERCENT: 29 %
MEAN CORPUSCULAR HEMOGLOBIN CONC: 34.9 g/dL (ref 31.5–35.7)
MEAN CORPUSCULAR HEMOGLOBIN: 31.4 pg (ref 26.6–33.0)
MEAN CORPUSCULAR VOLUME: 90 fL (ref 79–97)
MONOCYTES ABSOLUTE COUNT: 0.7 10*3/uL (ref 0.1–0.9)
MONOCYTES RELATIVE PERCENT: 14 %
NEUTROPHILS ABSOLUTE COUNT: 2.9 10*3/uL (ref 1.4–7.0)
NEUTROPHILS RELATIVE PERCENT: 53 %
PLATELET COUNT: 240 10*3/uL (ref 150–450)
RED BLOOD CELL COUNT: 4.23 x10E6/uL (ref 3.77–5.28)
RED CELL DISTRIBUTION WIDTH: 13 % (ref 11.7–15.4)
WHITE BLOOD CELL COUNT: 5.4 10*3/uL (ref 3.4–10.8)

## 2021-07-17 LAB — PHOSPHORUS: PHOSPHORUS, SERUM: 4 mg/dL (ref 3.0–4.3)

## 2021-07-17 LAB — COMPREHENSIVE METABOLIC PANEL
A/G RATIO: 2 (ref 1.2–2.2)
ALBUMIN: 4.7 g/dL (ref 3.7–4.7)
ALKALINE PHOSPHATASE: 58 IU/L (ref 44–121)
ALT (SGPT): 23 IU/L (ref 0–32)
AST (SGOT): 28 IU/L (ref 0–40)
BILIRUBIN TOTAL (MG/DL) IN SER/PLAS: 0.6 mg/dL (ref 0.0–1.2)
BLOOD UREA NITROGEN: 15 mg/dL (ref 8–27)
BUN / CREAT RATIO: 13 (ref 12–28)
CALCIUM: 9.5 mg/dL (ref 8.7–10.3)
CHLORIDE: 100 mmol/L (ref 96–106)
CO2: 25 mmol/L (ref 20–29)
CREATININE: 1.18 mg/dL — ABNORMAL HIGH (ref 0.57–1.00)
EGFR: 49 mL/min/{1.73_m2} — ABNORMAL LOW
GLOBULIN, TOTAL: 2.4 g/dL (ref 1.5–4.5)
GLUCOSE: 110 mg/dL — ABNORMAL HIGH (ref 70–99)
POTASSIUM: 3.7 mmol/L (ref 3.5–5.2)
SODIUM: 143 mmol/L (ref 134–144)
TOTAL PROTEIN: 7.1 g/dL (ref 6.0–8.5)

## 2021-07-17 LAB — BILIRUBIN, DIRECT: BILIRUBIN DIRECT: 0.19 mg/dL (ref 0.00–0.40)

## 2021-07-17 LAB — MAGNESIUM: MAGNESIUM: 1.6 mg/dL (ref 1.6–2.3)

## 2021-07-17 LAB — GAMMA GT: GAMMA GLUTAMYL TRANSFERASE: 37 IU/L (ref 0–60)

## 2021-07-17 NOTE — Telephone Encounter (Signed)
Pt is wanting a call back concerning her last visit with [facility] on 07/14/21. She is saying he told her to call back if she wasn't feeling any better. Please advise pt at [phone]

## 2021-07-17 NOTE — Telephone Encounter (Signed)
Spoke with patient. Patient is not feeling better so she called her pharmacy and is having Doxycycline sent to her. Advised patient to make sure she finishes antibiotic all the way and let us or her PCP know if she is not feeling better after.

## 2021-07-18 ENCOUNTER — Telehealth: Payer: Self-pay

## 2021-07-18 DIAGNOSIS — Z796 Long-term use of immunosuppressant medication: Principal | ICD-10-CM

## 2021-07-18 DIAGNOSIS — Z944 Liver transplant status: Principal | ICD-10-CM

## 2021-07-18 LAB — TACROLIMUS LEVEL: TACROLIMUS BLOOD: 6.7 ng/mL (ref 2.0–20.0)

## 2021-07-18 NOTE — Telephone Encounter (Signed)
Pt is requesting a referral to:  [facility] [address], [place], [place] [address] [phone]  For Memory testing.  Pt CB [phone]

## 2021-07-18 NOTE — Telephone Encounter (Signed)
Pt scheduled for 2/9 @ 9.20am

## 2021-07-18 NOTE — Unmapped (Signed)
Called pt to confirm if 2/1 tac level a true trough. Pt was unable to recall what time she took PM dose. Asked pt to repeat labs on Monday (2/6) and reinforced importance of obtaining labs 12 hours after PM dose. Pt verbalized understanding, wrote notes as reminder during phone call. Pt is scheduled for virtual appt with NP Metheny on 2/8, [facility] orders updated.

## 2021-07-21 DIAGNOSIS — Z796 Long-term use of immunosuppressant medication: Principal | ICD-10-CM

## 2021-07-21 DIAGNOSIS — Z944 Liver transplant status: Principal | ICD-10-CM

## 2021-07-21 NOTE — Unmapped (Signed)
Patient left Vm for [facility] explaining that she needed to reschedule her appt this week d/t issue with bronchitis. She said it has precluded her ability to go out to get labs as well. She reported she would complete labs once she felt better and TPA rescheduled her annual f/u appt for early next month.

## 2021-07-21 NOTE — Unmapped (Signed)
Pt called and said she needed to r/s annual telephone visit scheduled for 2/8 due to bronchitis. She wanted to wait until better to get labs. R/s for 3/8. She denied need for appt letter and verbalized understanding of all discussed.

## 2021-07-23 ENCOUNTER — Encounter (HOSPITAL_BASED_OUTPATIENT_CLINIC_OR_DEPARTMENT_OTHER): Payer: Self-pay | Admitting: Emergency Medicine

## 2021-07-23 ENCOUNTER — Other Ambulatory Visit: Payer: Self-pay

## 2021-07-23 ENCOUNTER — Emergency Department (HOSPITAL_BASED_OUTPATIENT_CLINIC_OR_DEPARTMENT_OTHER)
Admission: EM | Admit: 2021-07-23 | Discharge: 2021-07-23 | Disposition: A | Discharge status: Home / Self Care | Payer: Medicare Other | Attending: Emergency Medicine | Admitting: Emergency Medicine

## 2021-07-23 ENCOUNTER — Emergency Department (HOSPITAL_BASED_OUTPATIENT_CLINIC_OR_DEPARTMENT_OTHER): Payer: Medicare Other

## 2021-07-23 DIAGNOSIS — Z79899 Other long term (current) drug therapy: Secondary | ICD-10-CM | POA: Diagnosis not present

## 2021-07-23 DIAGNOSIS — I10 Essential (primary) hypertension: Secondary | ICD-10-CM | POA: Diagnosis not present

## 2021-07-23 DIAGNOSIS — R42 Dizziness and giddiness: Secondary | ICD-10-CM | POA: Diagnosis not present

## 2021-07-23 DIAGNOSIS — F32A Depression, unspecified: Secondary | ICD-10-CM | POA: Insufficient documentation

## 2021-07-23 DIAGNOSIS — Z7982 Long term (current) use of aspirin: Secondary | ICD-10-CM | POA: Diagnosis not present

## 2021-07-23 DIAGNOSIS — R82998 Other abnormal findings in urine: Secondary | ICD-10-CM | POA: Insufficient documentation

## 2021-07-23 DIAGNOSIS — R5383 Other fatigue: Secondary | ICD-10-CM

## 2021-07-23 DIAGNOSIS — R4182 Altered mental status, unspecified: Secondary | ICD-10-CM | POA: Diagnosis not present

## 2021-07-23 LAB — CBC WITH DIFFERENTIAL/PLATELET
Abs Immature Granulocytes: 0.04 10*3/uL (ref 0.00–0.07)
Basophils Absolute: 0.1 10*3/uL (ref 0.0–0.1)
Basophils Relative: 1 %
Eosinophils Absolute: 0.2 10*3/uL (ref 0.0–0.5)
Eosinophils Relative: 2 %
HCT: 40.2 % (ref 36.0–46.0)
Hemoglobin: 13.9 g/dL (ref 12.0–15.0)
Immature Granulocytes: 0 %
Lymphocytes Relative: 20 %
Lymphs Abs: 1.9 10*3/uL (ref 0.7–4.0)
MCH: 30.7 pg (ref 26.0–34.0)
MCHC: 34.6 g/dL (ref 30.0–36.0)
MCV: 88.7 fL (ref 80.0–100.0)
Monocytes Absolute: 0.7 10*3/uL (ref 0.1–1.0)
Monocytes Relative: 8 %
Neutro Abs: 6.7 10*3/uL (ref 1.7–7.7)
Neutrophils Relative %: 69 %
Platelets: 385 10*3/uL (ref 150–400)
RBC: 4.53 MIL/uL (ref 3.87–5.11)
RDW: 12.7 % (ref 11.5–15.5)
WBC: 9.7 10*3/uL (ref 4.0–10.5)
nRBC: 0 % (ref 0.0–0.2)

## 2021-07-23 LAB — URINALYSIS, ROUTINE W REFLEX MICROSCOPIC
Bilirubin Urine: NEGATIVE
Glucose, UA: NEGATIVE mg/dL
Hgb urine dipstick: NEGATIVE
Ketones, ur: NEGATIVE mg/dL
Nitrite: NEGATIVE
Protein, ur: 30 mg/dL — AB
Specific Gravity, Urine: 1.025 (ref 1.005–1.030)
pH: 5.5 (ref 5.0–8.0)

## 2021-07-23 LAB — COMPREHENSIVE METABOLIC PANEL
ALT: 15 U/L (ref 0–44)
AST: 28 U/L (ref 15–41)
Albumin: 4.6 g/dL (ref 3.5–5.0)
Alkaline Phosphatase: 53 U/L (ref 38–126)
Anion gap: 14 (ref 5–15)
BUN: 21 mg/dL (ref 8–23)
CO2: 24 mmol/L (ref 22–32)
Calcium: 9.7 mg/dL (ref 8.9–10.3)
Chloride: 103 mmol/L (ref 98–111)
Creatinine, Ser: 1.09 mg/dL — ABNORMAL HIGH (ref 0.44–1.00)
GFR, Estimated: 54 mL/min — ABNORMAL LOW (ref >60)
Glucose, Bld: 117 mg/dL — ABNORMAL HIGH (ref 70–99)
Potassium: 4.1 mmol/L (ref 3.5–5.1)
Sodium: 141 mmol/L (ref 135–145)
Total Bilirubin: 0.7 mg/dL (ref 0.3–1.2)
Total Protein: 7.7 g/dL (ref 6.5–8.1)

## 2021-07-23 LAB — AMMONIA: Ammonia: 30 umol/L (ref 9–35)

## 2021-07-23 LAB — CBG MONITORING, ED: Glucose-Capillary: 105 mg/dL — ABNORMAL HIGH (ref 70–99)

## 2021-07-23 NOTE — ED Provider Notes (Signed)
[facility] Provider Note   CSN: [id number] Arrival date & time: 07/23/21  4580     History  Chief Complaint  Patient presents with   Dizziness    [facility] is a 76 y.o. female.  HPI  76 year old female presents emergency department accompanied by daughter for multiple complaints.  Patient has been having a chronic decline in her cognitive function as well as depression over the past couple weeks.  Mother was diagnosed with Alzheimer's dementia and passed away at around the same age of the patient.  Has been having a lot of stress in regards to this.  She has been having sundowning, difficulty sleeping at night, anxiety.  She feels like she is in a mind fog, at times feels very fatigued and lightheaded but denies any specific dizziness.  No other acute symptoms including headache, chest pain, focal weakness/numbness, facial droop, speech difficulty.  No recent fever or illness.  Home Medications Prior to Admission medications   Medication Sig Start Date End Date Taking? Authorizing Provider  aspirin EC 81 MG tablet Take 81 mg by mouth daily.   Yes [provider]  atorvastatin (LIPITOR) 10 MG tablet TAKE ONE TABLET BY MOUTH ONCE DAILY FOR CHOLESTEROL. 04/30/21  Yes Janith Lima, MD  Biotin 800 MCG TABS Take 1 tablet by mouth.   Yes [provider]  CALCIUM-VITAMIN D PO Take 600 mg by mouth daily.   Yes [provider]  DULoxetine (CYMBALTA) 60 MG capsule TAKE 1 CAPSULE BY MOUTH EVERY DAY 04/24/21  Yes Janith Lima, MD  Ferrous Sulfate (IRON) 325 (65 Fe) MG TABS Take 1 tablet by mouth daily.   Yes [provider]  folic acid (FOLVITE) 998 MCG tablet Take 800 mcg by mouth daily.   Yes [provider]  tacrolimus (PROGRAF) 0.5 MG capsule 6 by mouth daily NAME BRAND ONLY   Yes [provider]      Allergies    Codeine, Other, Penicillins, Phenergan [promethazine], Sulfa antibiotics,  Tamsulosin, and Lidocaine    Review of Systems   Review of Systems  Constitutional:  Positive for fatigue. Negative for fever.  Respiratory:  Negative for shortness of breath.   Cardiovascular:  Negative for chest pain.  Gastrointestinal:  Negative for abdominal pain, diarrhea and vomiting.  Skin:  Negative for rash.  Neurological:  Positive for numbness. Negative for dizziness, facial asymmetry, speech difficulty, weakness and headaches.  Psychiatric/Behavioral:  Positive for agitation, confusion, decreased concentration and sleep disturbance. The patient is nervous/anxious.    Physical Exam Updated Vital Signs BP 128/77    Pulse 79    Temp 98.1 F (36.7 C)    Resp 20    Ht 5\' 3"  (1.6 m)    Wt 61.2 kg    SpO2 100%    BMI 23.91 kg/m  Physical Exam Vitals and nursing note reviewed.  Constitutional:      General: She is not in acute distress.    Appearance: Normal appearance.  HENT:     Head: Normocephalic.     Mouth/Throat:     Mouth: Mucous membranes are moist.  Eyes:     Extraocular Movements: Extraocular movements intact.     Pupils: Pupils are equal, round, and reactive to light.  Cardiovascular:     Rate and Rhythm: Normal rate.  Pulmonary:     Effort: Pulmonary effort is normal. No respiratory distress.  Abdominal:     Palpations: Abdomen is soft.  Tenderness: There is no abdominal tenderness.  Skin:    General: Skin is warm.  Neurological:     General: No focal deficit present.     Mental Status: She is alert and oriented to person, place, and time. Mental status is at baseline.     Cranial Nerves: No cranial nerve deficit.  Psychiatric:        Mood and Affect: Mood normal.        Behavior: Behavior normal.    ED Results / Procedures / Treatments   Labs (all labs ordered are listed, but only abnormal results are displayed) Labs Reviewed  COMPREHENSIVE METABOLIC PANEL - Abnormal; Notable for the following components:      Result Value   Glucose, Bld 117  (*)    Creatinine, Ser 1.09 (*)    GFR, Estimated 54 (*)    All other components within normal limits  URINALYSIS, ROUTINE W REFLEX MICROSCOPIC - Abnormal; Notable for the following components:   APPearance HAZY (*)    Protein, ur 30 (*)    Leukocytes,Ua MODERATE (*)    Bacteria, UA RARE (*)    Non Squamous Epithelial 0-5 (*)    All other components within normal limits  CBG MONITORING, ED - Abnormal; Notable for the following components:   Glucose-Capillary 105 (*)    All other components within normal limits  CBC WITH DIFFERENTIAL/PLATELET  AMMONIA    EKG None  Radiology CT Head Wo Contrast  Result Date: 07/23/2021 CLINICAL DATA:  Mental status change EXAM: CT HEAD WITHOUT CONTRAST TECHNIQUE: Contiguous axial images were obtained from the base of the skull through the vertex without intravenous contrast. RADIATION DOSE REDUCTION: This exam was performed according to the departmental dose-optimization program which includes automated exposure control, adjustment of the mA and/or kV according to patient size and/or use of iterative reconstruction technique. COMPARISON:  Head CT dated April 15, 2021 FINDINGS: Brain: Chronic white matter ischemic change. No evidence of acute infarction, hemorrhage, hydrocephalus, extra-axial collection or mass lesion/mass effect. Vascular: No hyperdense vessel or unexpected calcification. Skull: Normal. Negative for fracture or focal lesion. Sinuses/Orbits: Sinus mucosal thickening.  No acute abnormality. Other: None. IMPRESSION: No acute intracranial abnormality. Electronically Signed   By: Yetta Glassman M.D.   On: 07/23/2021 12:14    Procedures Procedures    Medications Ordered in ED Medications - No data to display  ED Course/ Medical Decision Making/ A&P                           Medical Decision Making Amount and/or Complexity of Data Reviewed Labs: ordered. Radiology: ordered.   This patient presents to the ED for concern of  cognitive decline, confusion, depression, this involves an extensive number of treatment options, and is a complaint that carries with it a high risk of complications and morbidity.  The differential diagnosis includes natural cognitive decline, dementia, UTI, metabolic encephalopathy, TIA/CVA   Additional history obtained: -Additional history obtained from daughter at bedside -External records from outside source obtained and reviewed including: Chart review including previous notes, labs, imaging, consultation notes   Lab Tests: -I ordered, reviewed, and interpreted labs.  The pertinent results include: Baseline and unremarkable labs, normal ammonia, urinalysis with bacteria and white blood cells but nitrate and leuk esterase negative, urine culture sent   Imaging Studies ordered: -I ordered imaging studies including CT of the head -I independently visualized and interpreted imaging which showed no acute changes -I agree  with the radiologist interpretation   ED Course: 76 year old female presents emergency department with 2 weeks of cognitive decline.  There are other nonspecific complaints including fatigue, brain fog, depression/anxiety, sleep disturbance.  No focal neurologic complaint or finding on exam.  Vitals are stable on arrival.  No acute findings of infection, metabolic encephalopathy, UTI.  CT of the head is stable.  Low suspicion for TIA/CVA.  No indication for emergent neurology consultation or transfer for MRI.  I believe that her symptoms are most likely secondary to an actual cognitive decline versus dementia, history of Alzheimer's dementia in her mother.  Plan for outpatient neurology follow-up, ambulatory order has been placed.   Cardiac Monitoring: The patient was maintained on a cardiac monitor.  I personally viewed and interpreted the cardiac monitored which showed an underlying rhythm of: Sinus rhythm   Reevaluation: After the interventions noted above, I  reevaluated the patient and found that they have :stayed the same   Dispostion: Patient at this time appears safe and stable for discharge and close outpatient follow up. Discharge plan and strict return to ED precautions discussed, patient verbalizes understanding and agreement.        Final Clinical Impression(s) / ED Diagnoses Final diagnoses:  None    Rx / DC Orders ED Discharge Orders     None         Lorelle Gibbs, DO 07/23/21 1358

## 2021-07-23 NOTE — ED Notes (Signed)
Patient given discharge instructions. Questions were answered. Patient verbalized understanding of discharge instructions and care at home.  Patient discharged with Daughter.

## 2021-07-23 NOTE — ED Triage Notes (Signed)
Pt with daughter from [place]. Pt's daughter reports a decline in cognitive function over the last 2 weeks. Such as confusion of time, forgetfulness and has trouble recalling names of people places and things. Pt has a history of TIAs. Pt denies pain.

## 2021-07-23 NOTE — Discharge Instructions (Addendum)
You have been seen and discharged from the emergency department.  Your head CT was unremarkable.  Your blood work was reassuring.  Urinalysis showed slight changes, urine culture has been sent.  You will be contacted if these results are abnormal.  Establish care with neurology for further evaluation.  Follow-up with your primary provider for further evaluation and further care. Take home medications as prescribed. If you have any worsening symptoms or further concerns for your health please return to an emergency department for further evaluation.

## 2021-07-24 ENCOUNTER — Ambulatory Visit: Payer: Medicare Other | Admitting: Internal Medicine

## 2021-07-25 LAB — URINE CULTURE: Culture: <10000 — AB

## 2021-07-28 ENCOUNTER — Telehealth (HOSPITAL_BASED_OUTPATIENT_CLINIC_OR_DEPARTMENT_OTHER): Payer: Self-pay | Admitting: Emergency Medicine

## 2021-07-28 DIAGNOSIS — Z944 Liver transplant status: Principal | ICD-10-CM

## 2021-07-28 DIAGNOSIS — Z796 Long-term use of immunosuppressant medication: Principal | ICD-10-CM

## 2021-07-29 ENCOUNTER — Encounter: Payer: Self-pay | Admitting: Physician Assistant

## 2021-07-29 ENCOUNTER — Ambulatory Visit (INDEPENDENT_AMBULATORY_CARE_PROVIDER_SITE_OTHER): Payer: Medicare Other | Admitting: Physician Assistant

## 2021-07-29 ENCOUNTER — Other Ambulatory Visit (INDEPENDENT_AMBULATORY_CARE_PROVIDER_SITE_OTHER): Payer: Medicare Other

## 2021-07-29 ENCOUNTER — Other Ambulatory Visit: Payer: Self-pay

## 2021-07-29 VITALS — BP 114/77 | HR 86 | Resp 18 | Ht 63.0 in | Wt 137.0 lb

## 2021-07-29 DIAGNOSIS — G3184 Mild cognitive impairment, so stated: Secondary | ICD-10-CM

## 2021-07-29 DIAGNOSIS — R6889 Other general symptoms and signs: Secondary | ICD-10-CM

## 2021-07-29 DIAGNOSIS — E559 Vitamin D deficiency, unspecified: Secondary | ICD-10-CM

## 2021-07-29 LAB — VITAMIN B12: Vitamin B-12: 200 pg/mL — ABNORMAL LOW (ref 211–911)

## 2021-07-29 LAB — TSH: TSH: 2.59 u[IU]/mL (ref 0.35–5.50)

## 2021-07-29 LAB — VITAMIN D 25 HYDROXY (VIT D DEFICIENCY, FRACTURES): VITD: 30.12 ng/mL (ref 30.00–100.00)

## 2021-07-29 NOTE — Progress Notes (Signed)
Assessment/Plan:   Candace Hill is a very pleasant 76 y.o. 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 MoCA today is 22/30, with deficiencies in visuospatial,'s obstruction,, and delayed recall 0 of 5.  Orientation is normal.  Per prior notes, the patient had a prior history of stroke and possibly TIA does, her memory difficulties, may be due to vascular etiology.  Further work-up is recommended.    Recommendations:   Mild cognitive impairment, likely vascular  MRI brain with/without contrast to assess for underlying structural abnormality and assess vascular load  Repeat neurocognitive evaluation for clarity of diagnosis, and to evaluate the trajectory of the disease. Check B12, TSH, ViT D Discussed safety both in and out of the home.  Discussed the importance of regular daily schedule to maintain brain function.  Continue to monitor mood with PCP.  Monitor Driving Stay active at least 30 minutes at least 3 times a week.  Naps should be scheduled and should be no longer than 60 minutes and should not occur after 2 PM.  Control cardiovascular risk factors  Mediterranean diet is recommended  Folllow up in 6  months  Subjective:    The patient is seen in neurologic consultation at the request of [name], [name], DO for the evaluation of memory. The patient is accompanied by her daughter who supplements the history. Her referral is about dizziness but her main complaint during this visit  is memory loss.  This is a 76 y.o. year old RH  female who has had memory issues for about 2 years.  She had initially been seen on 12/20/2019 by Dr. Delice Hill and had neurocognitive evaluation on 02/15/2020, with a likely diagnosis of mild cognitive impairment of vascular etiology.  At the time, a repeat neuropsychological evaluation in 18 months was  recommended to assess the trajectory of future cognitive decline should it occur.  She presented to the ED with worsening cognitive decline over the last 2 weeks, with nonspecific complaints including fatigue, brain fog, sleep disturbance, and a possible component of depression and anxiety.  There was no focal neurological complaint and finding on exam  She had stable vital signs.  There were no acute findings of infection, metabolic encephalopathy or UTI.  The CT of the head was stable.  There was low suspicion for TIA or CVA.  Symptoms were felt to be secondary to actual cognitive decline due to dementia.  She was referred to neurology for further evaluation. Her daughter also noticed that she has not been consistent with her medications, for example she is supposed to take Prograf 2 times a day, and she has been taking it only once a day (half dose), which places her at risk for infections and other complications.  Her level of activity has decreased as well, she is not interested in interacting with other people, or participating in different functions.  She is more disoriented when walking into her room, not remembering what she came to it for.  She denies living objects in unusual places.  She ambulates without significant difficulty, denies any falls or head injuries.  She does not feel lost when walking outside, does not wander off.  She continues to drive short distances.  She does admit to depression.  Denies irritability.  She sleeps well, denies vivid dreams or sleepwalking, hallucinations or paranoia.  There are  no hygiene concerns, she is independent of bathing and dressing.  Daughter monitors the finances.  Her appetite is fairly good, denies trouble swallowing.  She cooks, denies leaving the stove or the faucet on.  Denies headaches, double vision, she has a history of left central retinal vein occlusion followed by ophthalmology, the dizziness is mostly present when she stands to quickly, today she  denies dizziness.  She denies any focal numbness or tingling, unilateral weakness, tremors or anosmia.  No history of seizures.  She has a history of urine incontinence, denies constipation or diarrhea.  She denies a history of sleep apnea, alcohol or tobacco.  Family history remarkable for mother with Alzheimer's disease.      Initial Visit Dr. Delice Hill 7/7/21This is a pleasant 76 year old right-handed woman with a history of hypertension, hepatitis C, liver cancer s/p liver transplant, left retinal vein occlusion, TIA, presenting for evaluation of memory loss. She is verbose and slightly tangential. She saw her PCP in January 2021 reporting that her nurse friend is concerned about her memory. She was told she should be getting more things done and is having trouble with this. She was in the process of moving and was having a hard time, she moved last December. She says  "I've always pushed myself too much." She has noticed memory changes herself, she cannot remember things in a row. 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 She lives alone. She denies getting lost driving. She denies missing medications. She has to juggle things so she could keep up with things in general. MMSE 28/30 in July 2020. She now lives in senior independent living at [place]. She states she was adopted, and that her mother had Alzheimer's disease. She denies any significant head injuries. No alcohol use.    She is supposed to follow-up in [facility] for her liver transplant, but keeps saying that [facility] screwed up. She states "they went me the fake antirejection medication" and Medicare called her about it, she was sent the generic that is cheaper. She reports left retinal vein occlusion as a result of her steroids for her transplant. She has 7% vision on the left. She recalls and ruminates on a fall in February 2020 when she was in the bank and broke her  nose and 2 front teeth, saying "someone caused me to have an accident." She states she has not been okay since then. She falls more, feeling dizzy with spinning sensation. She cannot climb steps because she gets very dizzy. She reports an incident last week on the phone, she could not understand anything she was trying to say, gibberish was coming out. This lasted a few hours, the next morning she started to talk. She recalls having a headache at that time. She denied any focal weakness. She started B12 supplements in March 2021, B12 level was 248.   Neurocognitive testing 9 06/17/2019, Dr. Melvyn Novas results suggested deficits primarily surrounding phonemic fluency and encoding/retrieval aspects of memory. Performance variability was further noted across processing speed, executive functioning, and retention/consolidation aspects of memory. Regarding etiology, the most likely culprit for ongoing cognitive dysfunction is vascular in nature (i.e., mild vascular neurocognitive disorder). There appears to be some confusion regarding Ms. Redondo's stroke history. She was quite adamant that she was told that she has experienced three "mini-strokes" in her lifetime and that these were previously confirmed by a un-named physician. The term "mini-stroke" is problematic  due to its imprecision and likely should not be used in clinical capacities as it is unclear if this represents prior TIAs (i.e., temporary blockages that can result in short-term stroke-like symptoms which do not show changes on neuroimaging) or actual very small strokes. A brain MRI on 01/16/2015 did reveal a small focal lesion in the right genu of the corpus callosum which could represent a very small ischemic infarct. This lesion is visible on her more recent brain MRI (01/21/2020) but is not commented on. Looking at her 2016 MRI, it is also worth pointing out the potential for an additional small left parieto-temporal infarct around the area of the angular gyrus  which would be chronic in nature. Overall, while unclear, there does seem to be the potential that Ms. Simi has experienced at least one very small stroke in the past.     TSH 2.98 09/06/2019      Recent Labs       Lab Results  Component Value Date    VITAMINB12 248 09/06/2019      Allergies  Allergen Reactions   Codeine Nausea Only   Other Other (See Comments)    Pt has complete Retina Vessel Occlusion - left eye.     Penicillins Nausea Only and Other (See Comments)    Has patient had a PCN reaction causing immediate rash, facial/tongue/throat swelling, SOB or lightheadedness with hypotension: No Has patient had a PCN reaction causing severe rash involving mucus membranes or skin necrosis: No Has patient had a PCN reaction that required hospitalization No Has patient had a PCN reaction occurring within the last 10 years: No If all of the above answers are "NO", then may proceed with Cephalosporin use.   Phenergan [Promethazine] Other (See Comments)    Reaction:  Hallucinations  Pt states that she is only allergic to IV form.    Sulfa Antibiotics Itching   Tamsulosin Itching   Lidocaine Palpitations    Current Outpatient Medications  Medication Instructions   aspirin EC 81 mg, Oral, Daily   atorvastatin (LIPITOR) 10 MG tablet TAKE ONE TABLET BY MOUTH ONCE DAILY FOR CHOLESTEROL.   Biotin 800 MCG TABS 1 tablet, Oral   CALCIUM-VITAMIN D PO 600 mg, Oral, Daily   DULoxetine (CYMBALTA) 60 MG capsule TAKE 1 CAPSULE BY MOUTH EVERY DAY   Ferrous Sulfate (IRON) 325 (65 Fe) MG TABS 1 tablet, Oral, Daily   folic acid (FOLVITE) 952 mcg, Oral, Daily   tacrolimus (PROGRAF) 0.5 MG capsule 6 by mouth daily NAME BRAND ONLY     VITALS:   Vitals:   07/29/21 1045  BP: 114/77  Pulse: 86  Resp: 18  SpO2: 98%  Weight: 137 lb (62.1 kg)  Height: 5\' 3"  (1.6 m)    PHYSICAL EXAM   HEENT:  Normocephalic, atraumatic. The mucous membranes are moist. The superficial temporal arteries are  without ropiness or tenderness. Cardiovascular: Regular rate and rhythm. Lungs: Clear to auscultation bilaterally. Neck: There are no carotid bruits noted bilaterally.  NEUROLOGICAL: Montreal Cognitive Assessment  07/29/2021  Visuospatial/ Executive (0/5) 4  Naming (0/3) 3  Attention: Read list of digits (0/2) 2  Attention: Read list of letters (0/1) 1  Attention: Serial 7 subtraction starting at 100 (0/3) 1  Language: Repeat phrase (0/2) 2  Language : Fluency (0/1) 1  Abstraction (0/2) 2  Delayed Recall (0/5) 0  Orientation (0/6) 6  Total 22  Adjusted Score (based on education) 22   MMSE - Mini Mental State Exam 12/23/2018 12/20/2017  Orientation to time 4 4  Orientation to Place 4 4  Registration 3 3  Attention/ Calculation 5 5  Recall 3 3  Language- name 2 objects 2 2  Language- repeat 1 1  Language- follow 3 step command 3 3  Language- read & follow direction 1 1  Write a sentence 1 1  Copy design 1 1  Total score 28 [place] Mental Exam 12/20/2019  Weekday Correct 1  Current year 1  What state are we in? 1  Amount spent 1  Amount left 0  # of Animals 2  5 objects recall 3  Number series 2  Hour markers 2  Time correct 2  Placed X in triangle correctly 1  Largest Figure 1  Name of female 2  Date back to work 2  Type of work 2  State she lived in 2  Total score 25     Orientation:  Alert and oriented to person, place and time. No aphasia or dysarthria. Responses are hesitant, at times vague. Fund of knowledge is appropriate. Recent memory impaired and remote memory intact.  Attention and concentration are normal.  Able to name objects and repeat phrases. Delayed recall 0/5 Cranial nerves: There is good facial symmetry. Extraocular muscles are intact and visual fields are full to confrontational testing on the right, decreased on the left. Speech is fluent and clear. Soft palate rises symmetrically and there is no tongue deviation. Hearing is intact  to conversational tone. Tone: Tone is good throughout. Sensation: Sensation is intact to light touch and pinprick throughout. Vibration is intact at the bilateral big toe.There is no extinction with double simultaneous stimulation. There is no sensory dermatomal level identified. Coordination: The patient has no difficulty with RAM's or FNF bilaterally. Normal finger to nose  Motor: Strength is 5/5 in the bilateral upper and lower extremities. There is no pronator drift. There are no fasciculations noted. DTR's: Deep tendon reflexes are 2/4 at the bilateral biceps, triceps, brachioradialis, patella and achilles.  Plantar responses are downgoing bilaterally. Gait and Station: The patient is able to ambulate without difficulty.The patient is able to heel toe walk without any difficulty.The patient is able to ambulate in a tandem fashion. The patient is able to stand in the Romberg position.     Thank you for allowing [place] the opportunity to participate in the care of this nice patient. Please do not hesitate to contact us for any questions or concerns.   Total time spent on today's visit was 60 minutes, including both face-to-face time and nonface-to-face time.  Time included that spent on review of records (prior notes available to me/labs/imaging if pertinent), discussing treatment and goals, answering patient's questions and coordinating care.  Cc:  Janith Lima, MD  Sharene Butters 07/29/2021 11:23 AM

## 2021-07-29 NOTE — Patient Instructions (Addendum)
It was a pleasure to see you today at our office.   Recommendations:  Follow up  after neurocognitive testing   Check labs today  Neurocognitive testing will be arranged  Recommend assisted living   RECOMMENDATIONS FOR ALL PATIENTS WITH MEMORY PROBLEMS: 1. Continue to exercise (Recommend 30 minutes of walking everyday, or 3 hours every week) 2. Increase social interactions - continue going to [place] and enjoy social gatherings with friends and family 3. Eat healthy, avoid fried foods and eat more fruits and vegetables 4. Maintain adequate blood pressure, blood sugar, and blood cholesterol level. Reducing the risk of stroke and cardiovascular disease also helps promoting better memory. 5. Avoid stressful situations. Live a simple life and avoid aggravations. Organize your time and prepare for the next day in anticipation. 6. Sleep well, avoid any interruptions of sleep and avoid any distractions in the bedroom that may interfere with adequate sleep quality 7. Avoid sugar, avoid sweets as there is a strong link between excessive sugar intake, diabetes, and cognitive impairment We discussed the Mediterranean diet, which has been shown to help patients reduce the risk of progressive memory disorders and reduces cardiovascular risk. This includes eating fish, eat fruits and green leafy vegetables, nuts like almonds and hazelnuts, walnuts, and also use olive oil. Avoid fast foods and fried foods as much as possible. Avoid sweets and sugar as sugar use has been linked to worsening of memory function.  There is always a concern of gradual progression of memory problems. If this is the case, then we may need to adjust level of care according to patient needs. Support, both to the patient and caregiver, should then be put into place.    FALL PRECAUTIONS: Be cautious when walking. Scan the area for obstacles that may increase the risk of trips and falls. When getting up in the mornings, sit up at the  edge of the bed for a few minutes before getting out of bed. Consider elevating the bed at the head end to avoid drop of blood pressure when getting up. Walk always in a well-lit room (use night lights in the walls). Avoid area rugs or power cords from appliances in the middle of the walkways. Use a walker or a cane if necessary and consider physical therapy for balance exercise. Get your eyesight checked regularly.  FINANCIAL OVERSIGHT: Supervision, especially oversight when making financial decisions or transactions is also recommended.  HOME SAFETY: Consider the safety of the kitchen when operating appliances like stoves, microwave oven, and blender. Consider having supervision and share cooking responsibilities until no longer able to participate in those. Accidents with firearms and other hazards in the house should be identified and addressed as well.   ABILITY TO BE LEFT ALONE: If patient is unable to contact 911 operator, consider using LifeLine, or when the need is there, arrange for someone to stay with patients. Smoking is a fire hazard, consider supervision or cessation. Risk of wandering should be assessed by caregiver and if detected at any point, supervision and safe proof recommendations should be instituted.  MEDICATION SUPERVISION: Inability to self-administer medication needs to be constantly addressed. Implement a mechanism to ensure safe administration of the medications.   DRIVING: Regarding driving, in patients with progressive memory problems, driving will be impaired. We advise to have someone else do the driving if trouble finding directions or if minor accidents are reported. Independent driving assessment is available to determine safety of driving.   If you are interested in the driving assessment,  you can contact the following:  [employer] in [place]  [place] [phone]  [place]  [facility] [phone] or [phone]

## 2021-08-04 ENCOUNTER — Ambulatory Visit (HOSPITAL_COMMUNITY)
Admission: RE | Admit: 2021-08-04 | Discharge: 2021-08-04 | Disposition: A | Discharge status: Home / Self Care | Payer: Medicare Other | Source: Ambulatory Visit | Attending: Vascular Surgery | Admitting: Vascular Surgery

## 2021-08-04 ENCOUNTER — Other Ambulatory Visit: Payer: Self-pay

## 2021-08-04 DIAGNOSIS — Q272 Other congenital malformations of renal artery: Secondary | ICD-10-CM | POA: Diagnosis not present

## 2021-08-04 DIAGNOSIS — I728 Aneurysm of other specified arteries: Secondary | ICD-10-CM | POA: Insufficient documentation

## 2021-08-04 DIAGNOSIS — I774 Celiac artery compression syndrome: Secondary | ICD-10-CM | POA: Diagnosis not present

## 2021-08-04 DIAGNOSIS — N281 Cyst of kidney, acquired: Secondary | ICD-10-CM | POA: Diagnosis not present

## 2021-08-04 DIAGNOSIS — Z944 Liver transplant status: Principal | ICD-10-CM

## 2021-08-04 DIAGNOSIS — Z796 Long-term use of immunosuppressant medication: Principal | ICD-10-CM

## 2021-08-04 MED ORDER — IOHEXOL 350 MG/ML SOLN
100.0000 mL | Freq: Once | INTRAVENOUS | Status: AC | PRN
  Administered 2021-08-04: 100 mL via INTRAVENOUS

## 2021-08-04 MED ORDER — SODIUM CHLORIDE (PF) 0.9 % IJ SOLN
INTRAMUSCULAR | Status: AC
  Filled 2021-08-04: qty 50

## 2021-08-05 MED FILL — PROGRAF 0.5 MG CAPSULE: ORAL | 30 days supply | Qty: 180 | Fill #2

## 2021-08-05 NOTE — Unmapped (Signed)
[facility] Clinical Assessment & Refill Coordination Note    Kristy Hanson, DOB: 1946-02-02  Phone: [phone] (home)     All above HIPAA information was verified with patient.     Was a Nurse, learning disability used for this call? 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 ALPHAGAN P 0.1 % Drop Administer 1 drop to both eyes Two (2) times a day.     ??? aspirin (ECOTRIN) 81 MG tablet Take 81 mg by mouth daily.     ??? calcium carbonate 300 mg (750 mg) Chew Chew.     ??? cholecalciferol, vitamin D3, (VITAMIN D3) 1,000 unit capsule Take 1,000 Units by mouth daily.     ??? DULoxetine (CYMBALTA) 60 MG capsule Take 60 mg by mouth daily.     ??? ferrous sulfate 325 (65 FE) MG tablet Take 325 mg by mouth daily with breakfast.     ??? folic acid (FOLVITE) 1 MG tablet Take 400 mcg by mouth daily.      ??? ledipasvir 90 mg-sofosbuvir 400 mg (HARVONI) tablet Take 1 tablet by mouth daily. (Patient not taking: Reported on 03/30/2017) 28 tablet 2   ??? potassium citrate (UROCIT-K 15) 15 mEq TbER Take 2 tablets by mouth Two (2) times a day.      ??? propranolol (INNOPRAN XL) 80 MG 24 hr capsule Take 80 mg by mouth nightly.     ??? pyridoxine (B-6) 100 MG tablet Take 100 mg by mouth daily.     ??? rizatriptan (MAXALT-MLT) 5 MG disintegrating tablet Take 5 mg by mouth once as needed for migraine. May repeat in 2 hours if needed     ??? tacrolimus (PROGRAF) 0.5 MG capsule Take 3 capsules (1.5 mg total) by mouth two (2) times a day. 180 capsule 5   ??? traMADol (ULTRAM) 50 mg tablet Take 50 mg by mouth every six (6) hours as needed for pain.       No current facility-administered medications for this visit.        Changes to medications: Kristy Hanson reports no changes at this time.    Allergies   Allergen Reactions   ??? Avastin [Bevacizumab] Other (See Comments)     Mini stroke   ??? Prednisone Other (See Comments)     Patient was told by her ophthalmologist that the vision loss she had in one of her eyes was likely a result of high dose steroids. She also reports insomnia/jitters when taking it.   ??? Amoxicillin-Pot Clavulanate      Other reaction(s): NAUSEA   ??? Codeine      Other reaction(s): NAUSEA   ??? Penicillins      Other reaction(s): UNKNOWN  * Pt would like to know why she shouldn't take this*   ??? Procaine      Other reaction(s): dizziness   ??? Promethazine      oversedation   ??? Sulfa (Sulfonamide Antibiotics)      Other reaction(s): ITCHING   ??? Lidocaine Palpitations   ??? Tamsulosin Itching       Changes to allergies: No    SPECIALTY MEDICATION ADHERENCE     Prograf 0.5mg   : 5 days of medicine on hand       Medication Adherence    Patient reported X missed doses in the last month: 0  Specialty Medication: prograf 0.5mg   Patient is on  additional specialty medications: No  Patient is on more than two specialty medications: No          Specialty medication(s) dose(s) confirmed: Regimen is correct and unchanged.     Are there any concerns with adherence? No    Adherence counseling provided? Not needed    CLINICAL MANAGEMENT AND INTERVENTION      Clinical Benefit Assessment:    Do you feel the medicine is effective or helping your condition? Yes    Clinical Benefit counseling provided? Not needed    Adverse Effects Assessment:    Are you experiencing any side effects? No    Are you experiencing difficulty administering your medicine? No    Quality of Life Assessment:         How many days over the past month did your transplant  keep you from your normal activities? For example, brushing your teeth or getting up in the morning. 0    Have you discussed this with your provider? Not needed    Acute Infection Status:    Acute infections noted within Epic:  No active infections  Patient reported infection: None    Therapy Appropriateness:    Is therapy appropriate and patient progressing towards therapeutic goals? Yes, therapy is appropriate and should be continued    DISEASE/MEDICATION-SPECIFIC INFORMATION      N/A    PATIENT SPECIFIC NEEDS     - Does the patient have any physical, cognitive, or cultural barriers? No    - Is the patient high risk? No    - Does the patient require a Care Management Plan? No           SHIPPING     Specialty Medication(s) to be Shipped:   Transplant: Prograf 0.5mg     Other medication(s) to be shipped: No additional medications requested for fill at this time     Changes to insurance: No    Delivery Scheduled: Yes, Expected medication delivery date: 08/06/2021.     Medication will be delivered via UPS to the confirmed prescription address in [facility].    The patient will receive a drug information handout for each medication shipped and additional FDA Medication Guides as required.  Verified that patient has previously received a Conservation officer, historic buildings and a Surveyor, mining.    The patient or caregiver noted above participated in the development of this care plan and knows that they can request review of or adjustments to the care plan at any time.      All of the patient's questions and concerns have been addressed.    Thad Ranger   [facility] Specialty Pharmacist

## 2021-08-06 ENCOUNTER — Other Ambulatory Visit: Payer: Self-pay | Admitting: Internal Medicine

## 2021-08-06 DIAGNOSIS — F339 Major depressive disorder, recurrent, unspecified: Secondary | ICD-10-CM

## 2021-08-06 DIAGNOSIS — Z7960 Long term (current) use of unspecified immunomodulators and immunosuppressants: Secondary | ICD-10-CM | POA: Diagnosis not present

## 2021-08-06 DIAGNOSIS — F419 Anxiety disorder, unspecified: Secondary | ICD-10-CM

## 2021-08-06 DIAGNOSIS — Z944 Liver transplant status: Secondary | ICD-10-CM | POA: Diagnosis not present

## 2021-08-07 DIAGNOSIS — F4320 Adjustment disorder, unspecified: Secondary | ICD-10-CM | POA: Diagnosis not present

## 2021-08-07 LAB — CBC W/ DIFFERENTIAL
BANDED NEUTROPHILS ABSOLUTE COUNT: 0 10*3/uL (ref 0.0–0.1)
BASOPHILS ABSOLUTE COUNT: 0.1 10*3/uL (ref 0.0–0.2)
BASOPHILS RELATIVE PERCENT: 1 %
EOSINOPHILS ABSOLUTE COUNT: 0.2 10*3/uL (ref 0.0–0.4)
EOSINOPHILS RELATIVE PERCENT: 2 %
HEMATOCRIT: 40.5 % (ref 34.0–46.6)
HEMOGLOBIN: 13.7 g/dL (ref 11.1–15.9)
IMMATURE GRANULOCYTES: 0 %
LYMPHOCYTES ABSOLUTE COUNT: 1.7 10*3/uL (ref 0.7–3.1)
LYMPHOCYTES RELATIVE PERCENT: 22 %
MEAN CORPUSCULAR HEMOGLOBIN CONC: 33.8 g/dL (ref 31.5–35.7)
MEAN CORPUSCULAR HEMOGLOBIN: 31.7 pg (ref 26.6–33.0)
MEAN CORPUSCULAR VOLUME: 94 fL (ref 79–97)
MONOCYTES ABSOLUTE COUNT: 0.5 10*3/uL (ref 0.1–0.9)
MONOCYTES RELATIVE PERCENT: 7 %
NEUTROPHILS ABSOLUTE COUNT: 5.2 10*3/uL (ref 1.4–7.0)
NEUTROPHILS RELATIVE PERCENT: 68 %
PLATELET COUNT: 188 10*3/uL (ref 150–450)
RED BLOOD CELL COUNT: 4.32 x10E6/uL (ref 3.77–5.28)
RED CELL DISTRIBUTION WIDTH: 13.7 % (ref 11.7–15.4)
WHITE BLOOD CELL COUNT: 7.7 10*3/uL (ref 3.4–10.8)

## 2021-08-07 LAB — COMPREHENSIVE METABOLIC PANEL
A/G RATIO: 2.2 (ref 1.2–2.2)
ALBUMIN: 4.6 g/dL (ref 3.7–4.7)
ALKALINE PHOSPHATASE: 63 IU/L (ref 44–121)
ALT (SGPT): 12 IU/L (ref 0–32)
AST (SGOT): 17 IU/L (ref 0–40)
BILIRUBIN TOTAL (MG/DL) IN SER/PLAS: 0.6 mg/dL (ref 0.0–1.2)
BLOOD UREA NITROGEN: 11 mg/dL (ref 8–27)
BUN / CREAT RATIO: 12 (ref 12–28)
CALCIUM: 9.5 mg/dL (ref 8.7–10.3)
CHLORIDE: 104 mmol/L (ref 96–106)
CO2: 24 mmol/L (ref 20–29)
CREATININE: 0.95 mg/dL (ref 0.57–1.00)
GLOBULIN, TOTAL: 2.1 g/dL (ref 1.5–4.5)
GLUCOSE: 114 mg/dL — ABNORMAL HIGH (ref 70–99)
POTASSIUM: 3.5 mmol/L (ref 3.5–5.2)
SODIUM: 145 mmol/L — ABNORMAL HIGH (ref 134–144)
TOTAL PROTEIN: 6.7 g/dL (ref 6.0–8.5)

## 2021-08-07 LAB — GAMMA GT: GAMMA GLUTAMYL TRANSFERASE: 21 IU/L (ref 0–60)

## 2021-08-07 LAB — MAGNESIUM: MAGNESIUM: 1.7 mg/dL (ref 1.6–2.3)

## 2021-08-07 LAB — BILIRUBIN, DIRECT: BILIRUBIN DIRECT: 0.18 mg/dL (ref 0.00–0.40)

## 2021-08-07 LAB — PHOSPHORUS: PHOSPHORUS, SERUM: 4 mg/dL (ref 3.0–4.3)

## 2021-08-09 LAB — TACROLIMUS LEVEL: TACROLIMUS BLOOD: 22.8 ng/mL (ref 2.0–20.0)

## 2021-08-11 DIAGNOSIS — Z796 Long-term use of immunosuppressant medication: Principal | ICD-10-CM

## 2021-08-11 DIAGNOSIS — Z944 Liver transplant status: Principal | ICD-10-CM

## 2021-08-14 DIAGNOSIS — Z961 Presence of intraocular lens: Secondary | ICD-10-CM | POA: Diagnosis not present

## 2021-08-14 DIAGNOSIS — H34812 Central retinal vein occlusion, left eye, with macular edema: Secondary | ICD-10-CM | POA: Diagnosis not present

## 2021-08-14 DIAGNOSIS — H4010X0 Unspecified open-angle glaucoma, stage unspecified: Secondary | ICD-10-CM | POA: Diagnosis not present

## 2021-08-14 DIAGNOSIS — Z8673 Personal history of transient ischemic attack (TIA), and cerebral infarction without residual deficits: Secondary | ICD-10-CM | POA: Diagnosis not present

## 2021-08-14 DIAGNOSIS — H26491 Other secondary cataract, right eye: Secondary | ICD-10-CM | POA: Diagnosis not present

## 2021-08-18 DIAGNOSIS — Z944 Liver transplant status: Principal | ICD-10-CM

## 2021-08-18 DIAGNOSIS — Z796 Long-term use of immunosuppressant medication: Principal | ICD-10-CM

## 2021-08-20 ENCOUNTER — Institutional Professional Consult (permissible substitution): Admit: 2021-08-20 | Discharge: 2021-08-21 | Payer: MEDICARE

## 2021-08-20 ENCOUNTER — Ambulatory Visit: Payer: Medicare Other | Admitting: Physician Assistant

## 2021-08-20 DIAGNOSIS — Z4823 Encounter for aftercare following liver transplant: Secondary | ICD-10-CM | POA: Diagnosis not present

## 2021-08-20 NOTE — Unmapped (Addendum)
[facility], [facility], [place]  [address]  [place], [place]  [address]  Ph: [phone]  Fax: [phone]    August 20, 2021 10:25 AM      Patient Care Team:  Genia Harold, RN as Transplant Coordinator (Transplant)  Dr. Terrace Arabia as Attending Provider (Neurology)  Rolly Salter, MD as Attending Provider (Urology)  Bennye Alm, MD as Surgeon  Olivia Mackie, CPP as Pharmacist (Pharmacist)    RE: Kristy Hanson; DOB: Jun 26, 1945      Reason for visit: Follow-up status post OLT    HPI: 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 She was last seen in hepatology clinic pre-pandemic.  She has had no episodes of rejection or biliary strictures. Her last biopsy was on 01/27/2016 which showed NASH stage II disease. She denies any history of hypertension or diabetes. In regards to her immunosuppression she is currently on Prograf 1.5 mg twice a day. Trough level between 3-5. In regards to her chronic hepatitis C she was noted be genotype 1B and completed 12 weeks of Harvoni and achieved SVR. She is currently being followed by psychiatry and a counselor locally for depression, also seeing a Engineer, agricultural. She does acknowledges increased stresses at home. She is currently on Cymbalta 60 mg daily. She is also had some episodes of kidney stones. Has a splenic artery aneurysm  Today in clinic she denies any fever, chills, headache, jaundice, chest pain, upper lower GI abdomen, melena or confusion.        PMH:  Patient Active Problem List   Diagnosis   ??? Chronic hepatitis C virus infection (CMS-HCC)     Past Medical History:   Diagnosis Date   ??? Blindness     left eye   ??? Chronic pruritic rash in adult    ??? Depression    ??? Hepatitis C     Gemp 1b; SVR post-transplant 2015   ??? HTN (hypertension)    ??? 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 Nephrolithiasis        PSH:  Past Surgical History: Procedure Laterality Date   ??? LIVER TRANSPLANTATION  02-17-08   ??? TUBAL LIGATION Bilateral        MEDICATIONS:  Current Outpatient Medications   Medication Sig Dispense Refill   ??? ALPHAGAN P 0.1 % Drop Administer 1 drop to both eyes Two (2) times a day.     ??? aspirin (ECOTRIN) 81 MG tablet Take 81 mg by mouth daily.     ??? calcium carbonate 300 mg (750 mg) Chew Chew.     ??? cholecalciferol, vitamin D3, (VITAMIN D3) 1,000 unit capsule Take 1,000 Units by mouth daily.     ??? DULoxetine (CYMBALTA) 60 MG capsule Take 60 mg by mouth daily.     ??? ferrous sulfate 325 (65 FE) MG tablet Take 325 mg by mouth daily with breakfast.     ??? folic acid (FOLVITE) 1 MG tablet Take 400 mcg by mouth daily.      ??? ledipasvir 90 mg-sofosbuvir 400 mg (HARVONI) tablet Take 1 tablet by mouth daily. (Patient not taking: Reported on 03/30/2017) 28 tablet 2   ??? potassium citrate (UROCIT-K 15) 15 mEq TbER Take 2 tablets by mouth Two (2) times a day.      ??? propranolol (INNOPRAN XL) 80 MG 24 hr capsule Take 80 mg by mouth  nightly.     ??? pyridoxine (B-6) 100 MG tablet Take 100 mg by mouth daily.     ??? rizatriptan (MAXALT-MLT) 5 MG disintegrating tablet Take 5 mg by mouth once as needed for migraine. May repeat in 2 hours if needed     ??? tacrolimus (PROGRAF) 0.5 MG capsule Take 3 capsules (1.5 mg total) by mouth two (2) times a day. 180 capsule 5   ??? traMADol (ULTRAM) 50 mg tablet Take 50 mg by mouth every six (6) hours as needed for pain.       No current facility-administered medications for this visit.       ALLERGIES:  Avastin [bevacizumab], Prednisone, Amoxicillin-pot clavulanate, Codeine, Penicillins, Procaine, Promethazine, Sulfa (sulfonamide antibiotics), Lidocaine, and Tamsulosin      SH: Patient lives [place] perhaps moving in with her daughter soon. She is divorced since 2000. She has 1 daughter.        LABS:  Results for orders placed or performed in visit on 08/04/21   CBC w/ Differential   Result Value Ref Range    WBC 7.7 3.4 - 10.8 x10E3/uL    RBC 4.32 3.77 - 5.28 x10E6/uL    HGB 13.7 11.1 - 15.9 g/dL    HCT 81.1 91.4 - 78.2 %    MCV 94 79 - 97 fL    MCH 31.7 26.6 - 33.0 pg    MCHC 33.8 31.5 - 35.7 g/dL    RDW 95.6 21.3 - 08.6 %    Platelet 188 150 - 450 x10E3/uL    Neutrophils % 68 Not Estab. %    Lymphocytes % 22 Not Estab. %    Monocytes % 7 Not Estab. %    Eosinophils % 2 Not Estab. %    Basophils % 1 Not Estab. %    Absolute Neutrophils 5.2 1.4 - 7.0 x10E3/uL    Absolute Lymphocytes 1.7 0.7 - 3.1 x10E3/uL    Absolute Monocytes  0.5 0.1 - 0.9 x10E3/uL    Absolute Eosinophils 0.2 0.0 - 0.4 x10E3/uL    Absolute Basophils  0.1 0.0 - 0.2 x10E3/uL    Immature Granulocytes 0 Not Estab. %    Bands Absolute 0.0 0.0 - 0.1 x10E3/uL   Comprehensive Metabolic Panel   Result Value Ref Range    Glucose 114 (H) 70 - 99 mg/dL    BUN 11 8 - 27 mg/dL    Creatinine 5.78 4.69 - 1.00 mg/dL    BUN/Creatinine Ratio 12 12 - 28    Sodium 145 (H) 134 - 144 mmol/L    Potassium 3.5 3.5 - 5.2 mmol/L    Chloride 104 96 - 106 mmol/L    CO2 24 20 - 29 mmol/L    Calcium 9.5 8.7 - 10.3 mg/dL    Total Protein 6.7 6.0 - 8.5 g/dL    Albumin 4.6 3.7 - 4.7 g/dL    Globulin, Total 2.1 1.5 - 4.5 g/dL    A/G Ratio 2.2 1.2 - 2.2    Total Bilirubin 0.6 0.0 - 1.2 mg/dL    Alkaline Phosphatase 63 44 - 121 IU/L    AST 17 0 - 40 IU/L    ALT 12 0 - 32 IU/L   Bilirubin, Direct   Result Value Ref Range    Bilirubin, Direct 0.18 0.00 - 0.40 mg/dL   Phosphorus Level   Result Value Ref Range    Phosphorus, Serum 4.0 3.0 - 4.3 mg/dL   Magnesium Level   Result Value Ref Range  Magnesium 1.7 1.6 - 2.3 mg/dL   Gamma GT   Result Value Ref Range    GGT 21 0 - 60 IU/L   Tacrolimus level   Result Value Ref Range    Tacrolimus Lvl 22.8 (HH) 2.0 - 20.0 ng/mL     *Note: Due to a large number of results and/or encounters for the requested time period, some results have not been displayed. A complete set of results can be found in Results Review.         IMAGING  CLINICAL DATA: ??76 year old female with history of splenic artery   aneurysm. History of prior liver transplant.     EXAM:   CTA ABDOMEN AND PELVIS WITHOUT AND WITH CONTRAST     TECHNIQUE:   Multidetector CT imaging of the abdomen and pelvis was performed   using the standard protocol during bolus administration of   intravenous contrast. Multiplanar reconstructed images and MIPs were   obtained and reviewed to evaluate the vascular anatomy.     RADIATION DOSE REDUCTION: This exam was performed according to the   departmental dose-optimization program which includes automated   exposure control, adjustment of the mA and/or kV according to   patient size and/or use of iterative reconstruction technique.     CONTRAST: ?? OMNIPAQUE IOHEXOL 350 MG/ML SOLN     COMPARISON: ??07/09/2005     FINDINGS:   VASCULAR     Aorta: Minimal atherosclerotic changes of the distal thoracic aorta.   Diameter at the hiatus measures 18 mm.     No ulcerated plaque or pedunculated plaque. No wall thickening or   periaortic fluid/inflammation. No aneurysm.     Celiac: Celiac arteries patent without significant atherosclerotic   changes at the origin.     Surgical changes adjacent to the hepatic artery compatible with   prior transplant.     Aneurysm of the distal splenic artery is essentially unchanged in   size dating to the CT of 07/09/2005. Greatest diameter on the remote   CT 12 mm. Greatest diameter in similar axial plane on the current   CT, 12 mm. Similar appearance of near circumferential rim   calcification.     Additionally, there is a smaller partially rim calcified aneurysm on   a more distal arterial branch measuring 7 mm. While this was not   described on the baseline CT of 07/09/2005, it does appear to have   been present and unchanged diameter of 7 mm. Additional small nearly   completely calcified aneurysm at the hilum of the spleen, unchanged   from 2007, 6 mm     SMA: Patent, with no significant atherosclerotic changes.     Renals:     - Right: Main right renal artery patent without significant   atherosclerosis. Small accessory right renal??artery to the lower   pole cortex.     - Left: Left renal artery patent.     IMA: Inferior mesenteric artery is patent.     Right lower extremity:     Unremarkable course, caliber, and contour of the right iliac system.   No aneurysm, dissection, or occlusion. No significant   atherosclerotic changes. Hypogastric artery is patent. Common   femoral artery patent. Proximal SFA and profunda femoris patent.     Left lower extremity:     Unremarkable course, caliber, and contour of the left iliac system.   No aneurysm, dissection, or occlusion. No significant   atherosclerotic changes. Hypogastric artery is patent. Common   femoral  artery patent. Proximal SFA and profunda femoris patent.     Veins: Unremarkable appearance of the venous system.     Review of the MIP images confirms the above findings.     NON-VASCULAR     Lower chest: No acute.     Hepatobiliary: Unremarkable appearance of the liver. Unremarkable   gall bladder.     Pancreas: Unremarkable.     Spleen: Since the baseline CT 07/09/2005, there has been interval   decreased size of the spleen, now measuring less than 10 cm on axial     Adrenals/Urinary Tract:     - Right adrenal gland: Unremarkable     - Left adrenal gland: Unremarkable.     - Right kidney: No hydronephrosis, nephrolithiasis, inflammation, or   ureteral dilation. Cyst on the lower pole collecting system, 21 mm.     - Left Kidney: No hydronephrosis, nephrolithiasis, inflammation, or   ureteral dilation. No focal lesion.     - Urinary Bladder: Urinary bladder relatively decompressed.     Stomach/Bowel:     -??Stomach: Unremarkable.     - Small bowel: Unremarkable     - Appendix: Normal.     - Colon: Unremarkable.     Lymphatic: No adenopathy.     Mesenteric: No free fluid or air. No mesenteric adenopathy.     Reproductive: Unremarkable uterus/adnexa     Other: Surgical changes of the midline abdomen. Musculoskeletal: No bony canal narrowing. Mild degenerative changes   of the visualized thoracolumbar spine.     IMPRESSION:   Redemonstration of splenic artery aneurysm in the hilum of the   spleen, 12 mm, which is essentially unchanged in size and   configuration dating to the CT of 07/09/2005. Additionally, there   are 2 smaller aneurysms of more distal splenic branch arteries,   again relatively unchanged in this time interval.     Surgical changes of liver transplant.     Aortic Atherosclerosis (ICD10-I70.0).     Ancillary findings as above.     Signed,     Yvone Neu. Reyne Dumas, RPVI     [facility]     [facility]       Electronically Signed   ????By: Marijean Niemann ??Wagner D.O.   ????On: 08/04/2021 15:27          ASSESSMENT: Ms. 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  She has had no episodes of rejection or biliary strictures. Her last biopsy was on 01/27/2016 which showed NASH stage II disease. She denies any history of hypertension or diabetes. In regards to her immunosuppression she is currently on Prograf 1.5 mg twice a day. Trough level between 3-5. In regards to her chronic hepatitis C she was noted be genotype 1B and completed 12 weeks of Harvoni and achieved SVR. She is currently being followed by psychiatry and a counselor locally for depression and memory issues. She does acknowledges increased stresses at home. She is currently on Cymbalta 60 mg daily.     I do have some concern about her medication compliance, she's in an assisted living center, but is responsible for her own medications.     PLAN:  1. This patient was reviewed with Dr Ruffin Frederick  2. Labs reviewed   3. No Med changes at this time  4. RTC in 1 years          The  patient reports they are currently: at home. I spent 25 minutes on the phone visit with the patient on the date of service. I spent an additional 5 minutes on pre- and post-visit activities on the date of service.     The patient was not located and I was located within 250 yards of a hospital based location during the phone visit. The patient was physically located in [place] or a state in which I am permitted to provide care. The patient and/or parent/guardian understood that s/he may incur co-pays and cost sharing, and agreed to the telemedicine visit. The visit was reasonable and appropriate under the circumstances given the patient's presentation at the time.    The patient and/or parent/guardian has been advised of the potential risks and limitations of this mode of treatment (including, but not limited to, the absence of in-person examination) and has agreed to be treated using telemedicine. The patient's/patient's family's questions regarding telemedicine have been answered.    If the visit was completed in an ambulatory setting, the patient and/or parent/guardian has also been advised to contact their provider???s office for worsening conditions, and seek emergency medical treatment and/or call 911 if the patient deems either necessary.          Earley Abide, NP  [facility]  [address]  [place], [place]  [address]  Ph: [phone]  Fax: [phone]

## 2021-08-21 ENCOUNTER — Encounter: Payer: Self-pay | Admitting: Vascular Surgery

## 2021-08-25 DIAGNOSIS — Z944 Liver transplant status: Principal | ICD-10-CM

## 2021-08-25 DIAGNOSIS — Z796 Long-term use of immunosuppressant medication: Principal | ICD-10-CM

## 2021-09-01 DIAGNOSIS — Z944 Liver transplant status: Principal | ICD-10-CM

## 2021-09-01 DIAGNOSIS — Z796 Long-term use of immunosuppressant medication: Principal | ICD-10-CM

## 2021-09-02 NOTE — Unmapped (Signed)
[facility] Refill Coordination Note    Specialty Medication(s) to be Shipped:   Transplant: Prograf 0.5mg     Other medication(s) to be shipped: No additional medications requested for fill at this time     Kristy Hanson, DOB: 1946-06-13  Phone: [phone] (home)       All above HIPAA information was verified with patient.     Was a Nurse, learning disability used for this call? No    Completed refill call assessment today to schedule patient's medication shipment from the [facility] [phone]).  All relevant notes have been reviewed.     Specialty medication(s) and dose(s) confirmed: Regimen is correct and unchanged.   Changes to medications: [name] reports no changes at this time.  Changes to insurance: No  New side effects reported not previously addressed with a pharmacist or physician: None reported  Questions for the pharmacist: No    Confirmed patient received a Conservation officer, historic buildings and a Surveyor, mining with first shipment. The patient will receive a drug information handout for each medication shipped and additional FDA Medication Guides as required.       DISEASE/MEDICATION-SPECIFIC INFORMATION        N/A    SPECIALTY MEDICATION ADHERENCE     Medication Adherence    Patient reported X missed doses in the last month: 0  Specialty Medication: PROGRAF 0.5 MG capsule (tacrolimus)  Patient is on additional specialty medications: No              Were doses missed due to medication being on hold? No    Prograf 0.5 mg: 13 days of medicine on hand       REFERRAL TO PHARMACIST     Referral to the pharmacist: Not needed      [facility]     Shipping address confirmed in Epic.     Delivery Scheduled: Yes, Expected medication delivery date: 09/12/21.     Medication will be delivered via UPS to the prescription address in Epic WAM.    Tera Helper   [facility] Specialty Pharmacist

## 2021-09-08 DIAGNOSIS — Z7960 Long term (current) use of unspecified immunomodulators and immunosuppressants: Secondary | ICD-10-CM | POA: Diagnosis not present

## 2021-09-08 DIAGNOSIS — Z944 Liver transplant status: Principal | ICD-10-CM

## 2021-09-09 LAB — COMPREHENSIVE METABOLIC PANEL
A/G RATIO: 2.3 — ABNORMAL HIGH (ref 1.2–2.2)
ALBUMIN: 4.9 g/dL — ABNORMAL HIGH (ref 3.7–4.7)
ALKALINE PHOSPHATASE: 60 IU/L (ref 44–121)
ALT (SGPT): 11 IU/L (ref 0–32)
AST (SGOT): 16 IU/L (ref 0–40)
BILIRUBIN TOTAL (MG/DL) IN SER/PLAS: 0.6 mg/dL (ref 0.0–1.2)
BLOOD UREA NITROGEN: 20 mg/dL (ref 8–27)
BUN / CREAT RATIO: 19 (ref 12–28)
CALCIUM: 9.6 mg/dL (ref 8.7–10.3)
CHLORIDE: 100 mmol/L (ref 96–106)
CO2: 23 mmol/L (ref 20–29)
CREATININE: 1.07 mg/dL — ABNORMAL HIGH (ref 0.57–1.00)
GLOBULIN, TOTAL: 2.1 g/dL (ref 1.5–4.5)
GLUCOSE: 103 mg/dL — ABNORMAL HIGH (ref 70–99)
POTASSIUM: 3.1 mmol/L — ABNORMAL LOW (ref 3.5–5.2)
SODIUM: 142 mmol/L (ref 134–144)
TOTAL PROTEIN: 7 g/dL (ref 6.0–8.5)

## 2021-09-09 LAB — CBC W/ DIFFERENTIAL
BANDED NEUTROPHILS ABSOLUTE COUNT: 0 10*3/uL (ref 0.0–0.1)
BASOPHILS ABSOLUTE COUNT: 0.1 10*3/uL (ref 0.0–0.2)
BASOPHILS RELATIVE PERCENT: 1 %
EOSINOPHILS ABSOLUTE COUNT: 0.2 10*3/uL (ref 0.0–0.4)
EOSINOPHILS RELATIVE PERCENT: 2 %
HEMATOCRIT: 39.4 % (ref 34.0–46.6)
HEMOGLOBIN: 13.5 g/dL (ref 11.1–15.9)
IMMATURE GRANULOCYTES: 0 %
LYMPHOCYTES ABSOLUTE COUNT: 2.5 10*3/uL (ref 0.7–3.1)
LYMPHOCYTES RELATIVE PERCENT: 29 %
MEAN CORPUSCULAR HEMOGLOBIN CONC: 34.3 g/dL (ref 31.5–35.7)
MEAN CORPUSCULAR HEMOGLOBIN: 30.9 pg (ref 26.6–33.0)
MEAN CORPUSCULAR VOLUME: 90 fL (ref 79–97)
MONOCYTES ABSOLUTE COUNT: 0.6 10*3/uL (ref 0.1–0.9)
MONOCYTES RELATIVE PERCENT: 7 %
NEUTROPHILS ABSOLUTE COUNT: 5.2 10*3/uL (ref 1.4–7.0)
NEUTROPHILS RELATIVE PERCENT: 61 %
PLATELET COUNT: 221 10*3/uL (ref 150–450)
RED BLOOD CELL COUNT: 4.37 x10E6/uL (ref 3.77–5.28)
RED CELL DISTRIBUTION WIDTH: 13.1 % (ref 11.7–15.4)
WHITE BLOOD CELL COUNT: 8.6 10*3/uL (ref 3.4–10.8)

## 2021-09-09 LAB — GAMMA GT: GAMMA GLUTAMYL TRANSFERASE: 19 IU/L (ref 0–60)

## 2021-09-09 LAB — MAGNESIUM: MAGNESIUM: 1.8 mg/dL (ref 1.6–2.3)

## 2021-09-09 LAB — BILIRUBIN, DIRECT: BILIRUBIN DIRECT: 0.17 mg/dL (ref 0.00–0.40)

## 2021-09-09 LAB — PHOSPHORUS: PHOSPHORUS, SERUM: 4 mg/dL (ref 3.0–4.3)

## 2021-09-10 ENCOUNTER — Ambulatory Visit: Payer: Medicare Other | Admitting: Internal Medicine

## 2021-09-11 MED FILL — PROGRAF 0.5 MG CAPSULE: ORAL | 30 days supply | Qty: 180 | Fill #3

## 2021-09-14 LAB — TACROLIMUS LEVEL: TACROLIMUS (FK506),BLOOD: 6.8 ng/mL (ref 2.0–20.0)

## 2021-09-15 DIAGNOSIS — Z796 Long-term use of immunosuppressant medication: Principal | ICD-10-CM

## 2021-09-15 DIAGNOSIS — Z944 Liver transplant status: Principal | ICD-10-CM

## 2021-09-17 NOTE — Unmapped (Signed)
Patient's 3/27 K at 3.1 and tac above goal. Spoke with patient this morning, who said she had diarrhea  for 3 days last week after eating some bad food. Discussed increasing her intake of some K rich foods to replenish her stores. She confirmed she is taking the correct dosage of tacrolimus and that the timing of the dosage represented an accurate level. Encouraged hydration and repeat labs next month to assure tac was within goal. She verbalized understanding and again shared that she is in the process of moving in with her daughter.

## 2021-09-22 DIAGNOSIS — Z796 Long-term use of immunosuppressant medication: Principal | ICD-10-CM

## 2021-09-22 DIAGNOSIS — Z944 Liver transplant status: Principal | ICD-10-CM

## 2021-09-25 ENCOUNTER — Encounter: Payer: Self-pay | Admitting: Vascular Surgery

## 2021-09-25 ENCOUNTER — Ambulatory Visit (INDEPENDENT_AMBULATORY_CARE_PROVIDER_SITE_OTHER): Payer: Medicare Other | Admitting: Vascular Surgery

## 2021-09-25 VITALS — BP 127/77 | HR 89 | Temp 98.4°F | Resp 20 | Ht 63.0 in | Wt 131.0 lb

## 2021-09-25 DIAGNOSIS — I728 Aneurysm of other specified arteries: Secondary | ICD-10-CM

## 2021-09-25 NOTE — Progress Notes (Signed)
? ? ?REASON FOR VISIT:  ? ?Follow-up of small splenic artery aneurysm ? ?MEDICAL ISSUES:  ? ?SPLENIC ARTERY ANEURYSM: The patient's splenic artery aneurysm has not changed in size over the last year.  It was measured at 13 mm a year ago and on the most recent scan was measured at 12.4 mm.  Given that he had has been stable I think we can stretch her follow-up out to 18 months.  I have ordered a follow-up CT of the abdomen with IV contrast in 18 months and I will see her back at that time.  She knows to call sooner if she has problems.  I think it is very unlikely that this aneurysm will enlarge significantly. ? ? ?HPI:  ? ?Candace Hill is a pleasant 76 y.o. female who I saw in consultation on 09/25/2020 with a splenic artery aneurysm.  This was a small 1.3 cm asymptomatic calcified splenic artery aneurysm.  I recommended a CT of the abdomen in 1 year.  I felt that if the aneurysm had not changed significantly in size we might stretch the follow-up out to 18 months. ? ?Today she denies any abdominal pain or back pain.  There have been no significant changes in her medical history.  She is dealing with some depression.  She is not especially happy with the place where she is currently living and is considering other options. ? ? ?Past Medical History:  ?Diagnosis Date  ? Allergic rhinitis 11/14/2012  ? Amblyopia of eye, left 06/17/2017  ? Asbestos exposure 04/08/2018  ? Asthma   ? [phone] due to black mold house, no asthma now  ? Broken ankle   ? right  ? Broken rib   ? x2  ? Cataract   ? Central retinal vein occlusion   ? left eye retinal occlusion  ? Chronic hepatitis C virus infection 05/31/2013  ? Complication of anesthesia   ? IV phenergan  serious mental reaction  ? Depression   ? Essential hypertension 10/14/2017  ? Gastroesophageal reflux disease 10/14/2017  ? Hernia, femoral   ? left  ? History of blood transfusion 1971  ? acquired hepatitis c  ? History of kidney stones   ? History of liver transplant  11/14/2012  ? History of nephrolithiasis 10/14/2017  ? History of renal stone   ? History of shingles   ? Hyperglycemia 04/08/2018  ? Malignant neoplasm of liver 11/14/2012  ? Migraine 07/02/2015  ? 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 Seasonal affective disorder   ? Splenic artery aneurysm ([place])   ? Tailbone injury age 85 and age 70 and few weeks ago  ? broken x3  ? TIA (transient ischemic attack)   ? x3  ? 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 secondary to steroids for transplant-will get Avastin injections off/on  ? Vitamin D deficiency 04/08/2018  ? ? ?Family History  ?Adopted: Yes  ?Problem Relation Age of Onset  ? Stroke Mother   ? Alzheimer's disease Mother   ? Cancer Mother   ? Diabetes Mother   ?     Questionable, per new patient packet   ? Arthritis Mother   ?     Questionable, per new patient packet   ? Alcoholism Father   ? Suicidality Sister   ? Depression Sister   ? COPD Sister   ? COPD Brother   ?     Heavy smoker, per new patient packet   ? OCD Daughter   ? Personality  disorder Daughter   ? Alcoholism Son   ? Bipolar disorder Granddaughter   ?     Age 74 as of 07/10/2020  ? Schizophrenia Granddaughter   ? ? ?SOCIAL HISTORY: ?Social History  ? ?Tobacco Use  ? Smoking status: Never  ? Smokeless tobacco: Never  ?Substance Use Topics  ? Alcohol use: No  ? ? ?Allergies  ?Allergen Reactions  ? Codeine Nausea Only  ? Other Other (See Comments)  ?  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    ? Penicillins Nausea Only and Other (See Comments)  ?  Has patient had a PCN reaction causing immediate rash, facial/tongue/throat swelling, SOB or lightheadedness with hypotension: No ?Has patient had a PCN reaction causing severe rash involving mucus membranes or skin necrosis: No ?Has patient had a PCN reaction that required hospitalization No ?Has patient had a PCN reaction occurring within the last 10 years: No ?If all of the above answers are "NO", then may proceed with Cephalosporin use.  ? Phenergan  [Promethazine] Other (See Comments)  ?  Reaction:  Hallucinations  ?Pt states that she is only allergic to IV form.   ? Sulfa Antibiotics Itching  ? Tamsulosin Itching  ? Lidocaine Palpitations  ? ? ?Current Outpatient Medications  ?Medication Sig Dispense Refill  ? aspirin EC 81 MG tablet Take 81 mg by mouth daily.    ? atorvastatin (LIPITOR) 10 MG tablet TAKE ONE TABLET BY MOUTH ONCE DAILY FOR CHOLESTEROL. 90 tablet 0  ? Biotin 800 MCG TABS Take 1 tablet by mouth.    ? CALCIUM-VITAMIN D PO Take 600 mg by mouth daily.    ? DULoxetine (CYMBALTA) 60 MG capsule TAKE 1 CAPSULE BY MOUTH EVERY DAY FOR DEPPRESSION 30 capsule 2  ? Ferrous Sulfate (IRON) 325 (65 Fe) MG TABS Take 1 tablet by mouth daily.    ? folic acid (FOLVITE) 244 MCG tablet Take 800 mcg by mouth daily.    ? tacrolimus (PROGRAF) 0.5 MG capsule 6 by mouth daily NAME BRAND ONLY    ? ?Current Facility-Administered Medications  ?Medication Dose Route Frequency Provider Last Rate Last Admin  ? 0.9 %  sodium chloride infusion  500 mL Intravenous Once Doran Stabler, MD      ? ? ?REVIEW OF SYSTEMS:  ?'[X]'$  denotes positive finding, '[ ]'$  denotes negative finding ?Cardiac  Comments:  ?Chest pain or chest pressure:    ?Shortness of breath upon exertion:    ?Short of breath when lying flat:    ?Irregular heart rhythm:    ?    ?Vascular    ?Pain in calf, thigh, or hip brought on by ambulation:    ?Pain in feet at night that wakes you up from your sleep:     ?Blood clot in your veins:    ?Leg swelling:     ?    ?Pulmonary    ?Oxygen at home:    ?Productive cough:     ?Wheezing:     ?    ?Neurologic    ?Sudden weakness in arms or legs:     ?Sudden numbness in arms or legs:     ?Sudden onset of difficulty speaking or slurred speech:    ?Temporary loss of vision in one eye:     ?Problems with dizziness:     ?    ?Gastrointestinal    ?Blood in stool:     ?Vomited blood:     ?    ?Genitourinary    ?  Burning when urinating:     ?Blood in urine:    ?    ?Psychiatric     ?Major depression:     ?    ?Hematologic    ?Bleeding problems:    ?Problems with blood clotting too easily:    ?    ?Skin    ?Rashes or ulcers:    ?    ?Constitutional    ?Fever or chills:    ? ?PHYSICAL EXAM:  ? ?Vitals:  ? 09/25/21 1336  ?BP: 127/77  ?Pulse: 89  ?Resp: 20  ?Temp: 98.4 ?F (36.9 ?C)  ?SpO2: 96%  ?Weight: 131 lb (59.4 kg)  ?Height: '5\' 3"'$  (1.6 m)  ? ? ?GENERAL: The patient is a well-nourished female, in no acute distress. The vital signs are documented above. ?CARDIAC: There is a regular rate and rhythm.  ?VASCULAR: I do not detect carotid bruits. ?She has palpable pedal pulses. ?PULMONARY: There is good air exchange bilaterally without wheezing or rales. ?ABDOMEN: Soft and non-tender with normal pitched bowel sounds.  ?MUSCULOSKELETAL: There are no major deformities or cyanosis. ?NEUROLOGIC: No focal weakness or paresthesias are detected. ?SKIN: There are no ulcers or rashes noted. ?PSYCHIATRIC: The patient has a normal affect. ? ?DATA:   ? ?CT ANGIO ABDOMEN: I have reviewed the CT angio of the abdomen and review the images.  There has been no change in the size of the splenic artery aneurysm which is 12 to 13 mm in maximum diameter.  The aneurysm is calcified. ? ?Deitra Mayo ?[facility] [place] ?Office [phone] ?

## 2021-09-29 DIAGNOSIS — Z796 Long-term use of immunosuppressant medication: Principal | ICD-10-CM

## 2021-09-29 DIAGNOSIS — Z944 Liver transplant status: Principal | ICD-10-CM

## 2021-09-29 NOTE — Unmapped (Signed)
Patient reached out to [facility] to inquire about notices she is getting re.an upcoming appt on 23 Marrow with Vallery Sa Noted in chart that she had a clinic visit with this NP on 3/23rd. Explained that she could disregard that b/c she completed the appt, and that she would not need another liver annual until next year, but encouraged her to get labs every 3 mos. She verbalized understanding, but said she noticed she has had increased confusion overall and said she was moving in with her daughter now. Reassured her that this could be a very positive thing for her once she is adjusted, esp.since she commented that she had lost significant weight and was now 125lb. She verbalized agreement, stating her daughter cooks well, and that she was making an effort to move forward with a positive outlook. She reports that she was seen by her vascular physician and told that her aneurysm was stable/shrunken.

## 2021-10-03 NOTE — Unmapped (Signed)
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     Was a Nurse, learning disability used for this call? No    Completed refill call assessment today to schedule patient's medication shipment from the [facility] [phone]).  All relevant notes have been reviewed.     Specialty medication(s) and dose(s) confirmed: Regimen is correct and unchanged.   Changes to medications: [name] reports no changes at this time.  Changes to insurance: No  New side effects reported not previously addressed with a pharmacist or physician: None reported  Questions for the pharmacist: No    Confirmed patient received a Conservation officer, historic buildings and a Surveyor, mining with first shipment. The patient will receive a drug information handout for each medication shipped and additional FDA Medication Guides as required.       DISEASE/MEDICATION-SPECIFIC INFORMATION        N/A    SPECIALTY MEDICATION ADHERENCE     Medication Adherence    Patient reported X missed doses in the last month: 0  Specialty Medication: Prograf 0.5mg   Patient is on additional specialty medications: No        Were doses missed due to medication being on hold? No    Prograf 0.5 mg: 9 days of medicine on hand     REFERRAL TO PHARMACIST     Referral to the pharmacist: Not needed      [facility]     Shipping address confirmed in Epic.     Delivery Scheduled: Yes, Expected medication delivery date: 10/10/2021.     Medication will be delivered via UPS to the prescription address in Epic WAM.    Lorelei Pont [facility] Specialty Technician

## 2021-10-06 ENCOUNTER — Other Ambulatory Visit: Payer: Self-pay | Admitting: Internal Medicine

## 2021-10-06 DIAGNOSIS — Z944 Liver transplant status: Principal | ICD-10-CM

## 2021-10-06 DIAGNOSIS — Z796 Long-term use of immunosuppressant medication: Principal | ICD-10-CM

## 2021-10-09 MED FILL — PROGRAF 0.5 MG CAPSULE: ORAL | 30 days supply | Qty: 180 | Fill #4

## 2021-10-13 DIAGNOSIS — Z944 Liver transplant status: Principal | ICD-10-CM

## 2021-10-13 DIAGNOSIS — Z796 Long-term use of immunosuppressant medication: Principal | ICD-10-CM

## 2021-10-20 ENCOUNTER — Other Ambulatory Visit: Payer: Self-pay | Admitting: Internal Medicine

## 2021-10-20 DIAGNOSIS — Z796 Long-term use of immunosuppressant medication: Principal | ICD-10-CM

## 2021-10-20 DIAGNOSIS — Z944 Liver transplant status: Principal | ICD-10-CM

## 2021-10-27 DIAGNOSIS — Z796 Long-term use of immunosuppressant medication: Principal | ICD-10-CM

## 2021-10-27 DIAGNOSIS — Z944 Liver transplant status: Principal | ICD-10-CM

## 2021-10-29 ENCOUNTER — Telehealth: Payer: Self-pay | Admitting: Physician Assistant

## 2021-10-29 NOTE — Telephone Encounter (Signed)
Pt called in stating she is not going to be moving in with her daughter April after all. They are not getting along. The pt feels she needs to move to somewhere where she is alone and make that decision today. She feels if her memory is bad enough she may need assisted living, but cannot afford it. If Candace [place] strongly feels she shouldn't move out of her daughter's home, then she may decide to stay. She would like to speak with someone today. ?

## 2021-10-29 NOTE — Telephone Encounter (Signed)
Patient left message with access nurse stateing that she spoke with [facility] and would like to speak to her again.  She thought she missed a call from her.  ?

## 2021-10-29 NOTE — Telephone Encounter (Signed)
Patient advised and she will contact some references for living arrangements, assisted living, etc. Will call us back if she needs anything else. Patient thanked me for calling.  ?

## 2021-10-29 NOTE — Telephone Encounter (Signed)
Contacted patient she is going to call around again for areas for living.  ?

## 2021-10-31 NOTE — Unmapped (Signed)
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     Was a Nurse, learning disability used for this call? No    Completed refill call assessment today to schedule patient's medication shipment from the [facility] [phone]).  All relevant notes have been reviewed.     Specialty medication(s) and dose(s) confirmed: Regimen is correct and unchanged.   Changes to medications: [name] reports no changes at this time.  Changes to insurance: No  New side effects reported not previously addressed with a pharmacist or physician: None reported  Questions for the pharmacist: No    Confirmed patient received a Conservation officer, historic buildings and a Surveyor, mining with first shipment. The patient will receive a drug information handout for each medication shipped and additional FDA Medication Guides as required.       DISEASE/MEDICATION-SPECIFIC INFORMATION        N/A    SPECIALTY MEDICATION ADHERENCE     Medication Adherence    Patient reported X missed doses in the last month: 0  Specialty Medication: prograf 0.5  Patient is on additional specialty medications: No  Patient is on more than two specialty medications: No  Any gaps in refill history greater than 2 weeks in the last 3 months: no  Demonstrates understanding of importance of adherence: yes  Informant: patient  Reliability of informant: reliable  Provider-estimated medication adherence level: good  Patient is at risk for Non-Adherence: No  Reasons for non-adherence: no problems identified  Confirmed plan for next specialty medication refill: delivery by pharmacy  Refills needed for supportive medications: not needed          Refill Coordination    Has the Patients' Contact Information Changed: No  Is the Shipping Address Different: No         Were doses missed due to medication being on hold? No    prograf 0.5 mg: 8 days of medicine on hand         REFERRAL TO PHARMACIST     Referral to the pharmacist: Not needed      [facility]     Shipping address confirmed in Epic.     Delivery Scheduled: Yes, Expected medication delivery date: 05/24.     Medication will be delivered via UPS to the prescription address in Epic WAM.    Kristy Hanson   [facility] Specialty Technician

## 2021-11-03 DIAGNOSIS — Z944 Liver transplant status: Principal | ICD-10-CM

## 2021-11-03 DIAGNOSIS — Z796 Long-term use of immunosuppressant medication: Principal | ICD-10-CM

## 2021-11-04 MED FILL — PROGRAF 0.5 MG CAPSULE: ORAL | 30 days supply | Qty: 180 | Fill #5

## 2021-11-06 ENCOUNTER — Telehealth: Payer: Self-pay | Admitting: Physician Assistant

## 2021-11-06 NOTE — Telephone Encounter (Signed)
Spoke to the patient daughter, Form her opinion the patient having more mood swings, Memory is worse then when she was seen in February, Daughter feels like her mother needs to be seen by Dr.Merz sooner then October.  Advised patient daughter I will ask Clarise Cruz what to do next and if we can send the daughter the Alzheimer packet. To advise on Appt.

## 2021-11-06 NOTE — Telephone Encounter (Signed)
Patients daughter April called concerned about her mother. She stated she didn't want any information, she just wants to let sara know that her memory has got worse. She is forgetting days, how to charge phone etc. She wont move in with her, wont go to a living facility. Says her mind is fine. Personality is changing. She doesn't know what to do and she is concerned. She is moving into an apt this week but she feels her mother should not be alone.

## 2021-11-06 NOTE — Telephone Encounter (Signed)
The best it can be done is to place her on a cancellation list for Dr. Melvyn Novas . As for mood, please make sure that patient and daughter discuss with PCP any mood medications.    Also recommend in the interim  to call Levonne Lapping, Social Worker at our office , to discuss dementia counseling, including caregiver distress, community resources, Adult living facility, etc.   Feel free to visit Facebook page " [email or web address]" for tips of how to care for people with dementia. It is very informative because it teaches [place] on how to deal with certain behaviors   If she gets to be seen earlier, please let us know when, so we can move the appt from November with Sharene Butters.   Misty if you get a chance can you call the patient daughter. Packet at the front desk.

## 2021-11-10 DIAGNOSIS — Z944 Liver transplant status: Principal | ICD-10-CM

## 2021-11-10 DIAGNOSIS — Z796 Long-term use of immunosuppressant medication: Principal | ICD-10-CM

## 2021-11-17 DIAGNOSIS — Z944 Liver transplant status: Principal | ICD-10-CM

## 2021-11-17 DIAGNOSIS — Z796 Long-term use of immunosuppressant medication: Principal | ICD-10-CM

## 2021-11-18 ENCOUNTER — Other Ambulatory Visit: Payer: Self-pay | Admitting: Internal Medicine

## 2021-11-18 DIAGNOSIS — F419 Anxiety disorder, unspecified: Secondary | ICD-10-CM

## 2021-11-18 DIAGNOSIS — F339 Major depressive disorder, recurrent, unspecified: Secondary | ICD-10-CM

## 2021-11-24 DIAGNOSIS — Z796 Long-term use of immunosuppressant medication: Principal | ICD-10-CM

## 2021-11-24 DIAGNOSIS — Z944 Liver transplant status: Principal | ICD-10-CM

## 2021-12-04 NOTE — Unmapped (Signed)
The [facility] has made a third and final attempt to reach this patient to refill the following medication:Prograf.      We have been unable to leave messages on the following phone numbers: [phone] and have sent a MyChart message.    Dates contacted: 6/13, 6/20, 6/22  Last scheduled delivery: 11/04/21    The patient may be at risk of non-compliance with this medication. The patient should call the [facility] at [phone]  Option 4, then Option 2 (all other specialty patients) to refill medication.    Tera Helper   [facility] Specialty Pharmacist

## 2022-01-05 DIAGNOSIS — Z5181 Encounter for therapeutic drug level monitoring: Principal | ICD-10-CM

## 2022-01-05 DIAGNOSIS — E612 Magnesium deficiency: Principal | ICD-10-CM

## 2022-01-05 DIAGNOSIS — Z944 Liver transplant status: Principal | ICD-10-CM

## 2022-01-05 NOTE — Unmapped (Signed)
Patient paged on-call TNC to discuss timing of taking her medication, believing that taking so many meds at night could be disrupting her sleep, since she is awakened every hr to void. She stated she has been taking her prograf regularly at 11am and 11pm. The other medications she mentioned, ASA, cymbalta, biotin and vitamin c could all be taken during the daytime, but encouraged her to see her pcp regarding her nocturia. She also mentioned that her mind has been fuzzy, and she is scheduled to see a provider to evaluate her cognitive status. Supported her decision to do this, since she may learn exercises and techniques or obtain meds to improve her mental clarity.Discussed the importance of seeing her lab work every 3 mos. She believes she could still go to [facility]. Updated standing liver txp lab orders there. Patient notified TNC that she had moved, but decided not to live with her daughter. Updated phone/address in the chart. Provided TPA's phone number to schedule liver txp follow up visit.

## 2022-01-06 DIAGNOSIS — Z944 Liver transplant status: Secondary | ICD-10-CM | POA: Diagnosis not present

## 2022-01-06 DIAGNOSIS — E612 Magnesium deficiency: Secondary | ICD-10-CM | POA: Diagnosis not present

## 2022-01-06 DIAGNOSIS — Z5181 Encounter for therapeutic drug level monitoring: Secondary | ICD-10-CM | POA: Diagnosis not present

## 2022-01-06 LAB — BASIC METABOLIC PANEL
BUN: 14 (ref 4–21)
CO2: 27 — AB (ref 13–22)
Chloride: 98 — AB (ref 99–108)
Creatinine: 1.2 — AB (ref 0.5–1.1)
Glucose: 102
Potassium: 3.5 mEq/L (ref 3.5–5.1)
Sodium: 139 (ref 137–147)

## 2022-01-06 LAB — CBC AND DIFFERENTIAL
HCT: 41 (ref 36–46)
Hemoglobin: 14 (ref 12.0–16.0)
Platelets: 226 10*3/uL (ref 150–400)
WBC: 8.6

## 2022-01-06 LAB — HEPATIC FUNCTION PANEL
ALT: 8 U/L (ref 7–35)
AST: 16 (ref 13–35)
Alkaline Phosphatase: 62 (ref 25–125)

## 2022-01-06 LAB — COMPREHENSIVE METABOLIC PANEL: Calcium: 9.6 (ref 8.7–10.7)

## 2022-01-07 LAB — CBC W/ DIFFERENTIAL
BANDED NEUTROPHILS ABSOLUTE COUNT: 0 10*3/uL (ref 0.0–0.1)
BASOPHILS ABSOLUTE COUNT: 0.1 10*3/uL (ref 0.0–0.2)
BASOPHILS RELATIVE PERCENT: 1 %
EOSINOPHILS ABSOLUTE COUNT: 0.1 10*3/uL (ref 0.0–0.4)
EOSINOPHILS RELATIVE PERCENT: 2 %
HEMATOCRIT: 40.5 % (ref 34.0–46.6)
HEMOGLOBIN: 14 g/dL (ref 11.1–15.9)
IMMATURE GRANULOCYTES: 0 %
LYMPHOCYTES ABSOLUTE COUNT: 2.2 10*3/uL (ref 0.7–3.1)
LYMPHOCYTES RELATIVE PERCENT: 25 %
MEAN CORPUSCULAR HEMOGLOBIN CONC: 34.6 g/dL (ref 31.5–35.7)
MEAN CORPUSCULAR HEMOGLOBIN: 32.5 pg (ref 26.6–33.0)
MEAN CORPUSCULAR VOLUME: 94 fL (ref 79–97)
MONOCYTES ABSOLUTE COUNT: 0.6 10*3/uL (ref 0.1–0.9)
MONOCYTES RELATIVE PERCENT: 7 %
NEUTROPHILS ABSOLUTE COUNT: 5.7 10*3/uL (ref 1.4–7.0)
NEUTROPHILS RELATIVE PERCENT: 65 %
PLATELET COUNT: 226 10*3/uL (ref 150–450)
RED BLOOD CELL COUNT: 4.31 x10E6/uL (ref 3.77–5.28)
RED CELL DISTRIBUTION WIDTH: 13.4 % (ref 11.7–15.4)
WHITE BLOOD CELL COUNT: 8.6 10*3/uL (ref 3.4–10.8)

## 2022-01-07 LAB — COMPREHENSIVE METABOLIC PANEL
A/G RATIO: 2.5 — ABNORMAL HIGH (ref 1.2–2.2)
ALBUMIN: 4.9 g/dL — ABNORMAL HIGH (ref 3.8–4.8)
ALKALINE PHOSPHATASE: 62 IU/L (ref 44–121)
ALT (SGPT): 8 IU/L (ref 0–32)
AST (SGOT): 16 IU/L (ref 0–40)
BILIRUBIN TOTAL (MG/DL) IN SER/PLAS: 0.7 mg/dL (ref 0.0–1.2)
BLOOD UREA NITROGEN: 14 mg/dL (ref 8–27)
BUN / CREAT RATIO: 12 (ref 12–28)
CALCIUM: 9.6 mg/dL (ref 8.7–10.3)
CHLORIDE: 98 mmol/L (ref 96–106)
CO2: 27 mmol/L (ref 20–29)
CREATININE: 1.21 mg/dL — ABNORMAL HIGH (ref 0.57–1.00)
GLOBULIN, TOTAL: 2 g/dL (ref 1.5–4.5)
GLUCOSE: 102 mg/dL — ABNORMAL HIGH (ref 70–99)
POTASSIUM: 3.5 mmol/L (ref 3.5–5.2)
SODIUM: 139 mmol/L (ref 134–144)
TOTAL PROTEIN: 6.9 g/dL (ref 6.0–8.5)

## 2022-01-07 LAB — GAMMA GT: GAMMA GLUTAMYL TRANSFERASE: 16 IU/L (ref 0–60)

## 2022-01-07 LAB — BILIRUBIN, DIRECT: BILIRUBIN DIRECT: 0.17 mg/dL (ref 0.00–0.40)

## 2022-01-07 LAB — PHOSPHORUS: PHOSPHORUS, SERUM: 3.8 mg/dL (ref 3.0–4.3)

## 2022-01-07 LAB — MAGNESIUM: MAGNESIUM: 1.9 mg/dL (ref 1.6–2.3)

## 2022-01-09 LAB — TACROLIMUS LEVEL: TACROLIMUS BLOOD: 6.1 ng/mL (ref 2.0–20.0)

## 2022-01-12 DIAGNOSIS — Z5181 Encounter for therapeutic drug level monitoring: Principal | ICD-10-CM

## 2022-01-12 DIAGNOSIS — E612 Magnesium deficiency: Principal | ICD-10-CM

## 2022-01-12 DIAGNOSIS — Z944 Liver transplant status: Principal | ICD-10-CM

## 2022-01-19 ENCOUNTER — Other Ambulatory Visit: Payer: Self-pay | Admitting: Internal Medicine

## 2022-01-19 DIAGNOSIS — Z944 Liver transplant status: Principal | ICD-10-CM

## 2022-01-19 DIAGNOSIS — Z5181 Encounter for therapeutic drug level monitoring: Principal | ICD-10-CM

## 2022-01-19 DIAGNOSIS — E612 Magnesium deficiency: Principal | ICD-10-CM

## 2022-01-26 DIAGNOSIS — E612 Magnesium deficiency: Principal | ICD-10-CM

## 2022-01-26 DIAGNOSIS — Z5181 Encounter for therapeutic drug level monitoring: Principal | ICD-10-CM

## 2022-01-26 DIAGNOSIS — Z944 Liver transplant status: Principal | ICD-10-CM

## 2022-01-28 DIAGNOSIS — Z796 Long-term use of immunosuppressant medication: Principal | ICD-10-CM

## 2022-01-28 DIAGNOSIS — Z944 Liver transplant status: Principal | ICD-10-CM

## 2022-01-28 MED ORDER — TACROLIMUS 0.5 MG CAPSULE, IMMEDIATE-RELEASE
ORAL_CAPSULE | Freq: Two times a day (BID) | ORAL | 5 refills | 30 days | Status: CP
Start: 2022-01-28 — End: ?

## 2022-01-28 NOTE — Unmapped (Signed)
Patient paged on-call TNC. Spoke to patient, who reported she lost track of how much remaining tacrolimus she had, since she recently moved and now discovered she only has enough for her morning dose. When asked how she is taking her tacrolimus, she confirmed she is taking the correct dosage, but taking all 6 caps at night, instead of taking it every 12 hrs. She said she took it correctly before her last set of labs, but it got too hard, so she started taking all her medications at night. Emphasized the importance of taking all medications as prescribed, but explained that taking all her tac at once can be toxic to her and is designed to be split up. Encouraged her to set alarms to help remind her when she needs to take them. Patient said she thinks she can do that. Encouraged her to enlist her daughter's help with her meds. She acknowledged that her cognitive level has declined, but said that her daughter is trying to control everything she does, so she decided not to move in with her. Empathized with the difficulty of losing some independence, and suggested she consider doing therapy with herself and her daughter to negotiate each of their goals. She said she told her daughter that her daughter should get therapy, and patient said she will manage her medication better. Contacted local pharmacy for inventory of her tac dose. She requested script be sent to the local [facility]. Provided location information and phone contact for her to pick med up today. She verbalized understanding.

## 2022-01-31 DIAGNOSIS — R197 Diarrhea, unspecified: Principal | ICD-10-CM

## 2022-01-31 DIAGNOSIS — Z796 Long-term use of immunosuppressant medication: Principal | ICD-10-CM

## 2022-01-31 DIAGNOSIS — Z944 Liver transplant status: Principal | ICD-10-CM

## 2022-01-31 NOTE — Unmapped (Signed)
On call page received from Kristy Hanson- patient initially reports > one week diarrhea, but upon further conversation appears it is more likely to be 1-3 days.  Patient states recent change to immunosuppression medication ( from prograf to tacrolimus)     Able to tolerate po and maintaining adequate po intake.      Discussed with Dr. Ruffin Frederick - patient to continue with medications as ordered, take low dose imodium and have stool studies completed Monday.  She stated understanding

## 2022-02-02 DIAGNOSIS — R197 Diarrhea, unspecified: Principal | ICD-10-CM

## 2022-02-02 DIAGNOSIS — E612 Magnesium deficiency: Principal | ICD-10-CM

## 2022-02-02 DIAGNOSIS — Z944 Liver transplant status: Principal | ICD-10-CM

## 2022-02-02 DIAGNOSIS — D849 Immunodeficiency, unspecified: Principal | ICD-10-CM

## 2022-02-02 DIAGNOSIS — Z796 Long-term use of immunosuppressant medication: Principal | ICD-10-CM

## 2022-02-02 DIAGNOSIS — Z5181 Encounter for therapeutic drug level monitoring: Principal | ICD-10-CM

## 2022-02-02 MED ORDER — PROGRAF 0.5 MG CAPSULE
ORAL_CAPSULE | Freq: Two times a day (BID) | ORAL | 5 refills | 30 days | Status: CP
Start: 2022-02-02 — End: ?
  Filled 2022-02-03: qty 180, 30d supply, fill #0

## 2022-02-02 NOTE — Unmapped (Signed)
Patient paged on-call TN again this morning after contacting on-call TNC over the weekend. Spoke to patient who reports she has had diarrhea since starting generic prograf. She states she is taking it correctly, twice daily now. She requested she be switched to brand. [facility] [place], who could not order brand prograf. [facility] [employer] [facility] [place] and was told brand would now cost her >$600 with her good rx coupon and that hey have not filled an rx for her since last Sept. Sent script to [facility] and confirmed a copay for brand prograf of $0, but patient must contact them to answer questions and provide shipping details.    Returned call to patient and provided relevant numbers for [facility] and encouraged her to get specimen cup from [facility] for stool testing sample. She agreed to reach out to [facility] for prograf shipment today and said she would go to [facility]. She approved of TNC contacting her daughter to discuss today's concerns. She mentioned that TNC should be aware that her daughter is paranoid about food, but she agreed for [facility] to call her.    Spoke with patient's daughter, who reported the patient has had diarrhea frequently over the last year, although patient told her it has worsened of late. She stated the patient has a horrible diet, citing that she eats expired food and lives off moon pies and coke. Explained need for stool testing and that TNC will let her know if no prograf shipment is arranged with [facility] by tomorrow. She shared that her mother lives only 1/2 mile away from her, so she is able to check on her frequently, but patient is not in assisted living and refused to live with her daughter. Explained that per last record from [facility], it appears the patient's last monthly prograf refill was on 5/23 until her fill with generic last week. She verbalized understanding.

## 2022-02-03 NOTE — Unmapped (Signed)
8/22: patient is aware that prograf is billed on part b for $0, so delivery will require a signature. [place], she will notify her front off of the package that should arrive tomorrow. She will also call ups to see if there are any notes they can put in her profile to deliver to office or to call her for any deliveries (she is aware that ups doesn't let [facility] do any delivery requests) Kristy Hanson [facility] Clinical Assessment & Refill Coordination Note    Kristy Hanson, DOB: Apr 01, 1946  Phone: [phone] (home)     All above HIPAA information was verified with patient.     Was a Nurse, learning disability used for this call? 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 ALPHAGAN P 0.1 % Drop Administer 1 drop to both eyes Two (2) times a day.     ??? aspirin (ECOTRIN) 81 MG tablet Take 81 mg by mouth daily.     ??? calcium carbonate 300 mg (750 mg) Chew Chew.     ??? cholecalciferol, vitamin D3, (VITAMIN D3) 1,000 unit capsule Take 1,000 Units by mouth daily.     ??? DULoxetine (CYMBALTA) 60 MG capsule Take 60 mg by mouth daily.     ??? ferrous sulfate 325 (65 FE) MG tablet Take 325 mg by mouth daily with breakfast.     ??? folic acid (FOLVITE) 1 MG tablet Take 400 mcg by mouth daily.      ??? ledipasvir 90 mg-sofosbuvir 400 mg (HARVONI) tablet Take 1 tablet by mouth daily. (Patient not taking: Reported on 03/30/2017) 28 tablet 2   ??? potassium citrate (UROCIT-K 15) 15 mEq TbER Take 2 tablets by mouth Two (2) times a day.      ??? PROGRAF 0.5 mg capsule Take 3 capsules (1.5 mg total) by mouth two (2) times a day. 180 capsule 5   ??? propranolol (INNOPRAN XL) 80 MG 24 hr capsule Take 80 mg by mouth nightly.     ??? pyridoxine (B-6) 100 MG tablet Take 100 mg by mouth daily.     ??? rizatriptan (MAXALT-MLT) 5 MG disintegrating tablet Take 5 mg by mouth once as needed for migraine. May repeat in 2 hours if needed     ??? traMADol (ULTRAM) 50 mg tablet Take 50 mg by mouth every six (6) hours as needed for pain.       No current facility-administered medications for this visit.        Changes to medications: [place] reports no changes at this time.    Allergies   Allergen Reactions   ??? Avastin [Bevacizumab] Other (See Comments)     Mini stroke   ??? Prednisone Other (See Comments)     Patient was told by her ophthalmologist that the vision loss she had in one of her eyes was likely a result of high dose steroids. She also reports insomnia/jitters when taking it.   ??? Amoxicillin-Pot Clavulanate      Other reaction(s): NAUSEA   ??? Codeine      Other reaction(s): NAUSEA   ??? Penicillins      Other reaction(s): UNKNOWN  * Pt would like to know why she shouldn't take this*   ??? Procaine      Other reaction(s): dizziness   ??? Promethazine      oversedation   ???  Sulfa (Sulfonamide Antibiotics)      Other reaction(s): ITCHING   ??? Lidocaine Palpitations   ??? Tamsulosin Itching       Changes to allergies: No    SPECIALTY MEDICATION ADHERENCE     Prograf 0.5mg   : 0 days of medicine on hand       Medication Adherence    Patient reported X missed doses in the last month: 0  Specialty Medication: prograf 0.5mg                       Specialty medication(s) dose(s) confirmed: Regimen is correct and unchanged.     Are there any concerns with adherence? No    Adherence counseling provided? Not needed    CLINICAL MANAGEMENT AND INTERVENTION      Clinical Benefit Assessment:    Do you feel the medicine is effective or helping your condition? Yes    Clinical Benefit counseling provided? Not needed    Adverse Effects Assessment:    Are you experiencing any side effects? No    Are you experiencing difficulty administering your medicine? No    Quality of Life Assessment:    How many days over the past month did your transplant  keep you from your normal activities? For example, brushing your teeth or getting up in the morning. 0    Have you discussed this with your provider? Not needed    Acute Infection Status:    Acute infections noted within Epic:  Rule Out C. Diff  Patient reported infection: None    Therapy Appropriateness:    Is therapy appropriate and patient progressing towards therapeutic goals? Yes, therapy is appropriate and should be continued    DISEASE/MEDICATION-SPECIFIC INFORMATION      N/A    PATIENT SPECIFIC NEEDS     - Does the patient have any physical, cognitive, or cultural barriers? No    - Is the patient high risk? No    - Does the patient require a Care Management Plan? No     SOCIAL DETERMINANTS OF HEALTH     At the [facility], we have learned that life circumstances - like trouble affording food, housing, utilities, or transportation can affect the health of many of our patients.   That is why we wanted to ask: are you currently experiencing any life circumstances that are negatively impacting your health and/or quality of life? Patient declined to answer    Social Determinants of Health     Financial Resource Strain: Not on file   Internet Connectivity: Not on file   Food Insecurity: Not on file   Tobacco Use: Low Risk  (01/03/2018)    Patient History    ??? Smoking Tobacco Use: Never    ??? Smokeless Tobacco Use: Never    ??? Passive Exposure: Not on file   Housing/Utilities: Not on file   Alcohol Use: Not on file   Transportation Needs: Not on file   Substance Use: Not on file   Health Literacy: Not on file   Physical Activity: Not on file   Interpersonal Safety: Not on file   Stress: Not on file   Intimate Partner Violence: Not on file   Depression: Not on file   Social Connections: Not on file       Would you be willing to receive help with any of the needs that you have identified today? Not applicable       [facility]     Specialty  Medication(s) to be Shipped:   Transplant: Prograf 0.5mg     Other medication(s) to be shipped: No additional medications requested for fill at this time     Changes to insurance: No    Delivery Scheduled: Yes, Expected medication delivery date: 02/04/2022.     Medication will be delivered via UPS to the confirmed prescription address in [facility]  Patient is aware that delivery must be signed for since Part B.    The patient will receive a drug information handout for each medication shipped and additional FDA Medication Guides as required.  Verified that patient has previously received a Conservation officer, historic buildings and a Surveyor, mining.    The patient or caregiver noted above participated in the development of this care plan and knows that they can request review of or adjustments to the care plan at any time.      All of the patient's questions and concerns have been addressed.    Thad Ranger   [facility] Specialty Pharmacist

## 2022-02-09 DIAGNOSIS — E612 Magnesium deficiency: Principal | ICD-10-CM

## 2022-02-09 DIAGNOSIS — Z944 Liver transplant status: Principal | ICD-10-CM

## 2022-02-09 DIAGNOSIS — Z5181 Encounter for therapeutic drug level monitoring: Principal | ICD-10-CM

## 2022-02-16 DIAGNOSIS — Z944 Liver transplant status: Principal | ICD-10-CM

## 2022-02-16 DIAGNOSIS — Z5181 Encounter for therapeutic drug level monitoring: Principal | ICD-10-CM

## 2022-02-16 DIAGNOSIS — E612 Magnesium deficiency: Principal | ICD-10-CM

## 2022-02-17 NOTE — Unmapped (Signed)
TRF

## 2022-02-19 ENCOUNTER — Encounter: Payer: Medicare Other | Admitting: Psychology

## 2022-02-20 ENCOUNTER — Other Ambulatory Visit: Payer: Self-pay | Admitting: Internal Medicine

## 2022-02-20 ENCOUNTER — Telehealth: Payer: Self-pay | Admitting: Internal Medicine

## 2022-02-20 DIAGNOSIS — F419 Anxiety disorder, unspecified: Secondary | ICD-10-CM

## 2022-02-20 DIAGNOSIS — F339 Major depressive disorder, recurrent, unspecified: Secondary | ICD-10-CM

## 2022-02-20 NOTE — Telephone Encounter (Signed)
Patient needs a refill of cymbalta - please send to [address], [place]

## 2022-02-23 ENCOUNTER — Other Ambulatory Visit: Payer: Self-pay | Admitting: Internal Medicine

## 2022-02-23 DIAGNOSIS — E612 Magnesium deficiency: Principal | ICD-10-CM

## 2022-02-23 DIAGNOSIS — Z944 Liver transplant status: Principal | ICD-10-CM

## 2022-02-23 DIAGNOSIS — Z5181 Encounter for therapeutic drug level monitoring: Principal | ICD-10-CM

## 2022-03-02 DIAGNOSIS — Z5181 Encounter for therapeutic drug level monitoring: Principal | ICD-10-CM

## 2022-03-02 DIAGNOSIS — Z944 Liver transplant status: Principal | ICD-10-CM

## 2022-03-02 DIAGNOSIS — E612 Magnesium deficiency: Principal | ICD-10-CM

## 2022-03-03 ENCOUNTER — Telehealth: Payer: Self-pay | Admitting: Internal Medicine

## 2022-03-03 NOTE — Telephone Encounter (Signed)
LM with patients daughter April to rtn my call to schedule AWV with NHA. Call back # [phone]

## 2022-03-04 NOTE — Unmapped (Signed)
[facility] Refill Coordination Note    Specialty Medication(s) to be Shipped:   Transplant: Prograf 0.5mg     Other medication(s) to be shipped: No additional medications requested for fill at this time     Kristy Hanson, DOB: 11-13-1945  Phone: [phone] (home)       All above HIPAA information was verified with patient.     Was a Nurse, learning disability used for this call? No    Completed refill call assessment today to schedule patient's medication shipment from the [facility] [phone]).  All relevant notes have been reviewed.     Specialty medication(s) and dose(s) confirmed: Regimen is correct and unchanged.   Changes to medications: Kristy Hanson reports no changes at this time.  Changes to insurance: No  New side effects reported not previously addressed with a pharmacist or physician: None reported  Questions for the pharmacist: No    Confirmed patient received a Conservation officer, historic buildings and a Surveyor, mining with first shipment. The patient will receive a drug information handout for each medication shipped and additional FDA Medication Guides as required.       DISEASE/MEDICATION-SPECIFIC INFORMATION        N/A    SPECIALTY MEDICATION ADHERENCE     Medication Adherence    Patient reported X missed doses in the last month: 0  Specialty Medication: Prograf 0.5mg   Patient is on additional specialty medications: No                                Were doses missed due to medication being on hold? No    Prograf 0.5 mg: 7 days of medicine on hand     REFERRAL TO PHARMACIST     Referral to the pharmacist: Not needed      [facility]     Shipping address confirmed in Epic.     Delivery Scheduled: Yes, Expected medication delivery date: 03/06/22.     Medication will be delivered via UPS to the prescription address in Epic WAM.    Kristy Hanson   [facility] Specialty Pharmacist

## 2022-03-05 MED FILL — PROGRAF 0.5 MG CAPSULE: ORAL | 30 days supply | Qty: 180 | Fill #1

## 2022-03-09 DIAGNOSIS — Z5181 Encounter for therapeutic drug level monitoring: Principal | ICD-10-CM

## 2022-03-09 DIAGNOSIS — Z944 Liver transplant status: Principal | ICD-10-CM

## 2022-03-09 DIAGNOSIS — E612 Magnesium deficiency: Principal | ICD-10-CM

## 2022-03-11 ENCOUNTER — Telehealth: Payer: Self-pay

## 2022-03-11 ENCOUNTER — Ambulatory Visit: Payer: Medicare Other

## 2022-03-11 NOTE — Telephone Encounter (Signed)
Called patient no voicemail. Patient may reschedule for the next available appointment NHA or CMA. -S. Murdock Jellison,LPN

## 2022-03-12 ENCOUNTER — Ambulatory Visit (INDEPENDENT_AMBULATORY_CARE_PROVIDER_SITE_OTHER): Payer: Medicare Other | Admitting: Internal Medicine

## 2022-03-12 ENCOUNTER — Encounter: Payer: Self-pay | Admitting: Internal Medicine

## 2022-03-12 VITALS — BP 136/86 | HR 90 | Temp 98.1°F | Ht 63.0 in | Wt 125.0 lb

## 2022-03-12 DIAGNOSIS — N3000 Acute cystitis without hematuria: Secondary | ICD-10-CM

## 2022-03-12 DIAGNOSIS — E785 Hyperlipidemia, unspecified: Secondary | ICD-10-CM

## 2022-03-12 DIAGNOSIS — Z23 Encounter for immunization: Secondary | ICD-10-CM | POA: Diagnosis not present

## 2022-03-12 DIAGNOSIS — R739 Hyperglycemia, unspecified: Secondary | ICD-10-CM | POA: Diagnosis not present

## 2022-03-12 DIAGNOSIS — R350 Frequency of micturition: Secondary | ICD-10-CM

## 2022-03-12 DIAGNOSIS — R634 Abnormal weight loss: Secondary | ICD-10-CM

## 2022-03-12 DIAGNOSIS — R197 Diarrhea, unspecified: Secondary | ICD-10-CM | POA: Diagnosis not present

## 2022-03-12 DIAGNOSIS — N1832 Chronic kidney disease, stage 3b: Secondary | ICD-10-CM

## 2022-03-12 HISTORY — DX: Acute cystitis without hematuria: N30.00

## 2022-03-12 HISTORY — DX: Abnormal weight loss: R63.4

## 2022-03-12 LAB — URINALYSIS, ROUTINE W REFLEX MICROSCOPIC
Bilirubin Urine: NEGATIVE
Ketones, ur: NEGATIVE
Nitrite: POSITIVE — AB
Specific Gravity, Urine: 1.025 (ref 1.000–1.030)
Urine Glucose: NEGATIVE
Urobilinogen, UA: 0.2 (ref 0.0–1.0)
pH: 6 (ref 5.0–8.0)

## 2022-03-12 LAB — BASIC METABOLIC PANEL
BUN: 19 mg/dL (ref 6–23)
CO2: 32 mEq/L (ref 19–32)
Calcium: 9.7 mg/dL (ref 8.4–10.5)
Chloride: 100 mEq/L (ref 96–112)
Creatinine, Ser: 1.12 mg/dL (ref 0.40–1.20)
GFR: 48.48 mL/min — ABNORMAL LOW (ref >60.00)
Glucose, Bld: 103 mg/dL — ABNORMAL HIGH (ref 70–99)
Potassium: 3.5 mEq/L (ref 3.5–5.1)
Sodium: 141 mEq/L (ref 135–145)

## 2022-03-12 LAB — LIPID PANEL
Cholesterol: 202 mg/dL — ABNORMAL HIGH (ref 0–200)
HDL: 51.2 mg/dL (ref >39.00)
LDL Cholesterol: 133 mg/dL — ABNORMAL HIGH (ref 0–99)
NonHDL: 150.31
Total CHOL/HDL Ratio: 4
Triglycerides: 86 mg/dL (ref 0.0–149.0)
VLDL: 17.2 mg/dL (ref 0.0–40.0)

## 2022-03-12 LAB — TSH: TSH: 1.91 u[IU]/mL (ref 0.35–5.50)

## 2022-03-12 LAB — HEMOGLOBIN A1C: Hgb A1c MFr Bld: 5.4 % (ref 4.6–6.5)

## 2022-03-12 MED ORDER — NITROFURANTOIN MONOHYD MACRO 100 MG PO CAPS
100.0000 mg | ORAL_CAPSULE | Freq: Two times a day (BID) | ORAL | 0 refills | Status: AC
Start: 2022-03-12 — End: 2022-03-17

## 2022-03-12 NOTE — Progress Notes (Signed)
Subjective:  Patient ID: Candace Hill, female    DOB: 1945/07/04  Age: 76 y.o. MRN: [id number]  CC: Hyperlipidemia   HPI Candace Hill presents for f/up -  She is status post liver transplant at [facility].  She complains of weight loss, dry skin, chronic diarrhea, cloudy urine, dysuria.  Outpatient Medications Prior to Visit  Medication Sig Dispense Refill   aspirin EC 81 MG tablet Take 81 mg by mouth daily.     Biotin 800 MCG TABS Take 1 tablet by mouth.     CALCIUM-VITAMIN D PO Take 600 mg by mouth daily.     DULoxetine (CYMBALTA) 60 MG capsule TAKE 1 CAPSULE BY MOUTH DAILY FOR DEPRESSION 90 capsule 0   Ferrous Sulfate (IRON) 325 (65 Fe) MG TABS Take 1 tablet by mouth daily.     folic acid (FOLVITE) 790 MCG tablet Take 800 mcg by mouth daily.     tacrolimus (PROGRAF) 0.5 MG capsule 6 by mouth daily NAME BRAND ONLY     atorvastatin (LIPITOR) 10 MG tablet TAKE ONE TABLET BY MOUTH ONCE DAILY FOR CHOLESTEROL. 90 tablet 0   No facility-administered medications prior to visit.    ROS Review of Systems  Constitutional:  Positive for appetite change and unexpected weight change. Negative for activity change, diaphoresis and fatigue.  HENT: Negative.    Eyes: Negative.   Respiratory:  Negative for cough, chest tightness, shortness of breath and wheezing.   Cardiovascular:  Negative for chest pain, palpitations and leg swelling.  Gastrointestinal:  Positive for constipation and diarrhea. Negative for abdominal pain, blood in stool, nausea and vomiting.  Endocrine: Negative.   Genitourinary:  Positive for dysuria. Negative for difficulty urinating, frequency, hematuria and urgency.  Musculoskeletal: Negative.   Skin: Negative.   Neurological: Negative.  Negative for dizziness and weakness.  Hematological:  Negative for adenopathy. Does not bruise/bleed easily.  Psychiatric/Behavioral:  Positive for confusion and dysphoric mood. Negative for suicidal ideas. The patient is not  nervous/anxious.     Objective:  BP 136/86 (BP Location: Left Arm, Patient Position: Sitting, Cuff Size: Large)   Pulse 90   Temp 98.1 F (36.7 C) (Oral)   Ht '5\' 3"'$  (1.6 m)   Wt 125 lb (56.7 kg)   SpO2 97%   BMI 22.14 kg/m   BP Readings from Last 3 Encounters:  03/12/22 136/86  09/25/21 127/77  07/29/21 114/77    Wt Readings from Last 3 Encounters:  03/12/22 125 lb (56.7 kg)  09/25/21 131 lb (59.4 kg)  07/29/21 137 lb (62.1 kg)    Physical Exam Vitals reviewed.  HENT:     Mouth/Throat:     Mouth: Mucous membranes are moist.  Eyes:     General: No scleral icterus.    Conjunctiva/sclera: Conjunctivae normal.  Cardiovascular:     Rate and Rhythm: Normal rate and regular rhythm.     Heart sounds: No murmur heard. Pulmonary:     Effort: Pulmonary effort is normal.     Breath sounds: No stridor. No wheezing, rhonchi or rales.  Abdominal:     General: Abdomen is flat.     Palpations: There is no mass.     Tenderness: There is no abdominal tenderness. There is no guarding.     Hernia: No hernia is present.  Musculoskeletal:        General: Normal range of motion.     Cervical back: Neck supple.     Right lower leg: No edema.  Left lower leg: No edema.  Lymphadenopathy:     Cervical: No cervical adenopathy.  Skin:    General: Skin is warm and dry.  Neurological:     General: No focal deficit present.     Mental Status: She is alert.  Psychiatric:        Attention and Perception: She is inattentive.        Mood and Affect: Mood is not anxious or depressed.        Speech: She is communicative. Speech is tangential. Speech is not delayed.        Behavior: Behavior normal. Behavior is cooperative.        Thought Content: Thought content normal. Thought content is not paranoid or delusional. Thought content does not include homicidal or suicidal ideation.        Cognition and Memory: Cognition is impaired.     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 History of prior liver transplant. EXAM: CTA ABDOMEN AND PELVIS WITHOUT AND WITH CONTRAST TECHNIQUE: Multidetector CT imaging of the abdomen and pelvis was performed using the standard protocol during bolus administration of intravenous contrast. Multiplanar reconstructed images and MIPs were obtained and reviewed to evaluate the vascular anatomy. RADIATION DOSE REDUCTION: This exam was performed according to the departmental dose-optimization program which includes automated exposure control, adjustment of the mA and/or kV according to patient size and/or use of iterative reconstruction technique. CONTRAST:  121m OMNIPAQUE IOHEXOL 350 MG/ML SOLN COMPARISON:  07/09/2005 FINDINGS: VASCULAR Aorta: Minimal atherosclerotic changes of the distal thoracic aorta. Diameter at the hiatus measures 18 mm. No ulcerated plaque or pedunculated plaque. No wall thickening or periaortic fluid/inflammation. No aneurysm. Celiac: Celiac arteries patent without significant atherosclerotic changes at the origin. Surgical changes adjacent to the hepatic artery compatible with prior transplant. Aneurysm of the distal splenic artery is essentially unchanged in size dating to the CT of 07/09/2005. Greatest diameter on the remote CT 12 mm. Greatest diameter in similar axial plane on the current CT, 12 mm. Similar appearance of near  circumferential rim calcification. Additionally, there is a smaller partially rim calcified aneurysm on a more distal arterial branch measuring 7 mm. While this was not described on the baseline CT of 07/09/2005, it does appear to have been present and unchanged diameter of 7 mm. Additional small nearly completely calcified aneurysm at the hilum of the spleen, unchanged from 2007, 6 mm SMA: Patent, with no significant atherosclerotic changes. Renals: - Right: Main right renal artery patent without significant atherosclerosis. Small accessory right renal artery to the lower pole cortex. - Left: Left renal artery patent. IMA: Inferior mesenteric artery is patent. Right lower extremity: Unremarkable course, caliber, and contour of the right iliac system. No aneurysm, dissection, or occlusion.  No significant atherosclerotic changes. Hypogastric artery is patent. Common femoral artery patent. Proximal SFA and profunda femoris patent. Left lower extremity: Unremarkable course, caliber, and contour of the left iliac system. No aneurysm, dissection, or occlusion. No significant atherosclerotic changes. Hypogastric artery is patent. Common femoral artery patent. Proximal SFA and profunda femoris patent. Veins: Unremarkable appearance of the venous system. Review of the MIP images confirms the above findings. NON-VASCULAR Lower chest: No acute. Hepatobiliary: Unremarkable appearance of the liver. Unremarkable gall bladder. Pancreas: Unremarkable. Spleen: Since the baseline CT 07/09/2005, there has been interval decreased size of the spleen, now measuring less than 10 cm on axial Adrenals/Urinary Tract: - Right adrenal gland: Unremarkable - Left adrenal gland: Unremarkable. - Right kidney: No hydronephrosis, nephrolithiasis, inflammation, or ureteral dilation. Cyst on the lower pole collecting system, 21 mm. - Left Kidney: No hydronephrosis, nephrolithiasis, inflammation, or ureteral dilation. No focal lesion. - Urinary  Bladder: Urinary bladder relatively decompressed. Stomach/Bowel: - Stomach: Unremarkable. - Small bowel: Unremarkable - Appendix: Normal. - Colon: Unremarkable. Lymphatic: No adenopathy. Mesenteric: No free fluid or air. No mesenteric adenopathy. Reproductive: Unremarkable uterus/adnexa Other: Surgical changes of the midline abdomen. Musculoskeletal: No bony canal narrowing. Mild degenerative changes of the visualized thoracolumbar spine. IMPRESSION: Redemonstration of splenic artery aneurysm in the hilum of the spleen, 12 mm, which is essentially unchanged in size and configuration dating to the CT of 07/09/2005. Additionally, there are 2 smaller aneurysms of more distal splenic branch arteries, again relatively unchanged in this time interval. Surgical changes of liver transplant. Aortic Atherosclerosis (ICD10-I70.0). Ancillary findings as above. Signed, Dulcy Fanny. Dellia Nims, RPVI [facility] Electronically Signed   By: Corrie Mckusick D.O.   On: 08/04/2021 15:27    Assessment & Plan:   Jeannelle was seen today for hyperlipidemia.  Diagnoses and all orders for this visit:  Flu vaccine need -     Flu Vaccine QUAD High Dose(Fluad)  Weight loss, non-intentional -     TSH; Future -     Urinalysis, Routine w reflex microscopic; Future -     Urinalysis, Routine w reflex microscopic -     TSH -     Ambulatory referral to Gastroenterology  Urinary frequency -     TSH; Future -     Urinalysis, Routine w reflex microscopic; Future -     Hemoglobin A1c; Future -     Hemoglobin A1c -     Urinalysis, Routine w reflex microscopic -     TSH  Stage 3b chronic kidney disease ([place])- Will avoid nephrotoxic agents.  Hyperglycemia- Her A1c is normal. -     Basic metabolic panel; Future -     Hemoglobin A1c; Future -     Hemoglobin A1c -     Basic metabolic panel  Hyperlipidemia with target LDL less than 130- She has not achieved her LDL goal.   Will restart the statin. -     Lipid panel; Future -     Lipid panel -     atorvastatin (LIPITOR) 10 MG tablet; Take 1 tablet (10 mg total) by mouth daily.  Acute cystitis without hematuria- Her UA is abnormal.  Will treat with Macrobid. -     nitrofurantoin, macrocrystal-monohydrate, (MACROBID) 100 MG capsule; Take 1 capsule (100 mg total) by mouth 2 (two) times daily for 5 days.  Diarrhea, unspecified type -     Ambulatory referral to Gastroenterology   I have changed Josephine Cables "Pat"'s atorvastatin. I am also  having her start on nitrofurantoin (macrocrystal-monohydrate). Additionally, I am having her maintain her folic acid, Iron, aspirin EC, CALCIUM-VITAMIN D PO, tacrolimus, Biotin, and DULoxetine.  Meds ordered this encounter  Medications   nitrofurantoin, macrocrystal-monohydrate, (MACROBID) 100 MG capsule    Sig: Take 1 capsule (100 mg total) by mouth 2 (two) times daily for 5 days.    Dispense:  10 capsule    Refill:  0   atorvastatin (LIPITOR) 10 MG tablet    Sig: Take 1 tablet (10 mg total) by mouth daily.    Dispense:  90 tablet    Refill:  1     Follow-up: Return in about 3 months (around 06/11/2022).  Scarlette Calico, MD

## 2022-03-12 NOTE — Patient Instructions (Signed)

## 2022-03-13 DIAGNOSIS — R197 Diarrhea, unspecified: Secondary | ICD-10-CM | POA: Insufficient documentation

## 2022-03-13 HISTORY — DX: Diarrhea, unspecified: R19.7

## 2022-03-13 MED ORDER — ATORVASTATIN CALCIUM 10 MG PO TABS: 10.0000 mg | ORAL_TABLET | Freq: Every day | ORAL | 1 refills | Status: DC

## 2022-03-16 DIAGNOSIS — Z944 Liver transplant status: Principal | ICD-10-CM

## 2022-03-16 DIAGNOSIS — E612 Magnesium deficiency: Principal | ICD-10-CM

## 2022-03-16 DIAGNOSIS — Z5181 Encounter for therapeutic drug level monitoring: Principal | ICD-10-CM

## 2022-03-18 ENCOUNTER — Encounter: Payer: Self-pay | Admitting: Psychology

## 2022-03-18 ENCOUNTER — Ambulatory Visit: Payer: Medicare Other | Admitting: Psychology

## 2022-03-18 ENCOUNTER — Ambulatory Visit (INDEPENDENT_AMBULATORY_CARE_PROVIDER_SITE_OTHER): Payer: Medicare Other | Admitting: Psychology

## 2022-03-18 DIAGNOSIS — F0670 Mild neurocognitive disorder due to known physiological condition without behavioral disturbance: Secondary | ICD-10-CM | POA: Diagnosis not present

## 2022-03-18 DIAGNOSIS — I6381 Other cerebral infarction due to occlusion or stenosis of small artery: Secondary | ICD-10-CM

## 2022-03-18 DIAGNOSIS — I728 Aneurysm of other specified arteries: Secondary | ICD-10-CM | POA: Insufficient documentation

## 2022-03-18 DIAGNOSIS — F411 Generalized anxiety disorder: Secondary | ICD-10-CM | POA: Diagnosis not present

## 2022-03-18 DIAGNOSIS — F22 Delusional disorders: Secondary | ICD-10-CM

## 2022-03-18 DIAGNOSIS — R4189 Other symptoms and signs involving cognitive functions and awareness: Secondary | ICD-10-CM

## 2022-03-18 DIAGNOSIS — F333 Major depressive disorder, recurrent, severe with psychotic symptoms: Secondary | ICD-10-CM | POA: Diagnosis not present

## 2022-03-18 HISTORY — DX: Mild neurocognitive disorder due to known physiological condition without behavioral disturbance: F06.70

## 2022-03-18 HISTORY — DX: Delusional disorders: F22
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
## 2022-03-18 NOTE — Progress Notes (Signed)
   Psychometrician Note   Cognitive testing was administered to Candace Hill by Milana Kidney, B.S. (psychometrist) under the supervision of Dr. Christia Reading, Ph.D., licensed psychologist on 03/18/2022. Ms. Petti did not appear overtly distressed by the testing session per behavioral observation or responses across self-report questionnaires. Rest breaks were offered.    The battery of tests administered was selected by Dr. Christia Reading, Ph.D. with consideration to Ms. Motl's current level of functioning, the nature of her symptoms, emotional and behavioral responses during interview, level of literacy, observed level of motivation/effort, and the nature of the referral question. This battery was communicated to the psychometrist. Communication between Dr. Christia Reading, Ph.D. and the psychometrist was ongoing throughout the evaluation and Dr. Christia Reading, Ph.D. was immediately accessible at all times. Dr. Christia Reading, Ph.D. provided supervision to the psychometrist on the date of this service to the extent necessary to assure the quality of all services provided.    Swayze Kozuch will return within approximately 1-2 weeks for an interactive feedback session with Dr. Melvyn Novas at which time her test performances, clinical impressions, and treatment recommendations will be reviewed in detail. Ms. Wrightson understands she can contact our office should she require our assistance before this time.  A total of 165 minutes of billable time were spent face-to-face with Ms. Marshell Levan by the psychometrist. This includes both test administration and scoring time. Billing for these services is reflected in the clinical report generated by Dr. Christia Reading, Ph.D.  This note reflects time spent with the psychometrician and does not include test scores or any clinical interpretations made by Dr. Melvyn Novas. The full report will follow in a separate note.

## 2022-03-23 ENCOUNTER — Encounter: Payer: Medicare Other | Admitting: Psychology

## 2022-03-23 ENCOUNTER — Ambulatory Visit (INDEPENDENT_AMBULATORY_CARE_PROVIDER_SITE_OTHER): Payer: Medicare Other | Admitting: Psychology

## 2022-03-23 DIAGNOSIS — I6381 Other cerebral infarction due to occlusion or stenosis of small artery: Secondary | ICD-10-CM

## 2022-03-23 DIAGNOSIS — F411 Generalized anxiety disorder: Secondary | ICD-10-CM

## 2022-03-23 DIAGNOSIS — F22 Delusional disorders: Secondary | ICD-10-CM | POA: Diagnosis not present

## 2022-03-23 DIAGNOSIS — F0670 Mild neurocognitive disorder due to known physiological condition without behavioral disturbance: Secondary | ICD-10-CM

## 2022-03-23 DIAGNOSIS — F331 Major depressive disorder, recurrent, moderate: Secondary | ICD-10-CM

## 2022-03-23 DIAGNOSIS — Z5181 Encounter for therapeutic drug level monitoring: Principal | ICD-10-CM

## 2022-03-23 DIAGNOSIS — E612 Magnesium deficiency: Principal | ICD-10-CM

## 2022-03-23 DIAGNOSIS — Z944 Liver transplant status: Principal | ICD-10-CM

## 2022-03-23 NOTE — Progress Notes (Signed)
   Neuropsychology Feedback Session [name]. [facility] Department of Neurology  Reason for Referral:   Candace Hill is a 76 y.o. right-handed Caucasian female referred by Sharene Butters, PA-C, to characterize her current cognitive functioning and assist with diagnostic clarity and treatment planning in the context of a prior history of a vascular neurocognitive disorder, psychiatric comorbidities, and concerns for ongoing progressive decline.  Feedback:   [name] completed a comprehensive neuropsychological evaluation on 03/18/2022. Please refer to that encounter for the full report and recommendations. Briefly, results suggested primary impairments surrounding semantic fluency, receptive language, and all aspects of verbal learning and memory. Additional performance variability was exhibited across processing speed, executive functioning, phonemic fluency, and visual learning and memory. Relative to her previous evaluation in August 2021, performance declines were exhibited across several domains. This was most prominent across executive functioning, receptive language, and semantic fluency. Declines were also evidence across aspects of memory, particularly delayed retrieval aspects of verbal memory tasks. Other assessed domains generally exhibited stability. The etiology of ongoing dysfunction is unclear as patterns across testing continue to be somewhat nonspecific. Previously, I theorized a primary vascular contribution given medical ailments, microvascular ischemic disease, and prior lacunar infarct. A vascular contribution certainly remains a strong possibility. However, generally speaking, this would not yield progressively worsening cognitive abilities with no reported vascular or other medical event in the interim. The presence of an underlying neurodegenerative condition remains a possibility. Of these illnesses, Alzheimer's disease would be most likely. Across verbal  memory tasks, she did not benefit from repeated exposure to information, exhibited prominent difficulties recalling previously learned information after a brief delay, and performed very poorly across yes/no recognition trials. This could suggest rapid forgetting and an evolving storage impairment, which are hallmark signs of this illness. Impairment and progressive decline in semantic fluency would represent typical disease progression. However, visual memory scores remained generally adequate and performance across confrontation naming was also adequate. While concerns are reasonable, she does not exhibit a typical Alzheimer's disease profile across testing at the present time.  [name] was accompanied by her daughter during the current feedback session. Content of the current session focused on the results of her neuropsychological evaluation. [name] was given the opportunity to ask questions and her questions were answered. She was encouraged to reach out should additional questions arise. A copy of her report was provided at the conclusion of the visit. A second copy was made for her daughter.      54 minutes were spent conducting the current feedback session with [name], billed as one unit [phone].

## 2022-03-26 NOTE — Unmapped (Signed)
[facility] Refill Coordination Note    Specialty Medication(s) to be Shipped:   Transplant: Prograf 0.5mg     Other medication(s) to be shipped: No additional medications requested for fill at this time     Kristy Hanson, DOB: 1945/12/31  Phone: [phone] (home)       All above HIPAA information was verified with patient.     Was a Nurse, learning disability used for this call? No    Completed refill call assessment today to schedule patient's medication shipment from the [facility] [phone]).  All relevant notes have been reviewed.     Specialty medication(s) and dose(s) confirmed: Regimen is correct and unchanged.   Changes to medications: Kristy Hanson reports no changes at this time.  Changes to insurance: No  New side effects reported not previously addressed with a pharmacist or physician: None reported  Questions for the pharmacist: No    Confirmed patient received a Conservation officer, historic buildings and a Surveyor, mining with first shipment. The patient will receive a drug information handout for each medication shipped and additional FDA Medication Guides as required.       DISEASE/MEDICATION-SPECIFIC INFORMATION        N/A    SPECIALTY MEDICATION ADHERENCE     Medication Adherence    Patient reported X missed doses in the last month: 0  Specialty Medication: PROGRAF 0.5 MG capsule (tacrolimus)  Patient is on additional specialty medications: No                            Were doses missed due to medication being on hold? No    Prograf 0.5 mg: 9 days of medicine on hand     REFERRAL TO PHARMACIST     Referral to the pharmacist: Not needed      [facility]     Shipping address confirmed in Epic.     Delivery Scheduled: Yes, Expected medication delivery date: 04/01/22.     Medication will be delivered via UPS to the prescription address in Epic WAM.    Kristy Hanson   [facility] Specialty Pharmacist

## 2022-03-30 DIAGNOSIS — E612 Magnesium deficiency: Principal | ICD-10-CM

## 2022-03-30 DIAGNOSIS — Z5181 Encounter for therapeutic drug level monitoring: Principal | ICD-10-CM

## 2022-03-30 DIAGNOSIS — Z944 Liver transplant status: Principal | ICD-10-CM

## 2022-03-31 MED FILL — PROGRAF 0.5 MG CAPSULE: ORAL | 30 days supply | Qty: 180 | Fill #2

## 2022-04-06 DIAGNOSIS — E612 Magnesium deficiency: Principal | ICD-10-CM

## 2022-04-06 DIAGNOSIS — Z944 Liver transplant status: Principal | ICD-10-CM

## 2022-04-06 DIAGNOSIS — Z5181 Encounter for therapeutic drug level monitoring: Principal | ICD-10-CM

## 2022-04-13 DIAGNOSIS — Z5181 Encounter for therapeutic drug level monitoring: Principal | ICD-10-CM

## 2022-04-13 DIAGNOSIS — E612 Magnesium deficiency: Principal | ICD-10-CM

## 2022-04-13 DIAGNOSIS — Z944 Liver transplant status: Principal | ICD-10-CM

## 2022-04-14 ENCOUNTER — Encounter: Payer: Medicare Other | Admitting: Psychology

## 2022-04-20 NOTE — Unmapped (Signed)
[facility] Refill Coordination Note    Specialty Medication(s) to be Shipped:   Transplant: Prograf 0.5mg     Other medication(s) to be shipped: No additional medications requested for fill at this time     Kristy Hanson, DOB: 04/29/1946  Phone: [phone] (home)       All above HIPAA information was verified with patient.     Was a Nurse, learning disability used for this call? No    Completed refill call assessment today to schedule patient's medication shipment from the [facility] [phone]).  All relevant notes have been reviewed.     Specialty medication(s) and dose(s) confirmed: Regimen is correct and unchanged.   Changes to medications: Kristy Hanson reports no changes at this time.  Changes to insurance: No  New side effects reported not previously addressed with a pharmacist or physician: None reported  Questions for the pharmacist: No    Confirmed patient received a Conservation officer, historic buildings and a Surveyor, mining with first shipment. The patient will receive a drug information handout for each medication shipped and additional FDA Medication Guides as required.       DISEASE/MEDICATION-SPECIFIC INFORMATION        N/A    SPECIALTY MEDICATION ADHERENCE     Medication Adherence    Patient reported X missed doses in the last month: 0  Specialty Medication: PROGRAF 0.5 MG capsule (tacrolimus)  Patient is on additional specialty medications: No                            Were doses missed due to medication being on hold? No    Prograf 0.5 mg: 9 days of medicine on hand     REFERRAL TO PHARMACIST     Referral to the pharmacist: Not needed      [facility]     Shipping address confirmed in Epic.     Delivery Scheduled: Yes, Expected medication delivery date: 04/28/22.     Medication will be delivered via UPS to the prescription address in Epic WAM.    Kristy Hanson   [facility] Specialty Pharmacist

## 2022-04-21 ENCOUNTER — Encounter: Payer: Medicare Other | Admitting: Psychology

## 2022-04-23 ENCOUNTER — Encounter (HOSPITAL_BASED_OUTPATIENT_CLINIC_OR_DEPARTMENT_OTHER): Payer: Self-pay

## 2022-04-23 ENCOUNTER — Emergency Department (HOSPITAL_BASED_OUTPATIENT_CLINIC_OR_DEPARTMENT_OTHER)
Admission: EM | Admit: 2022-04-23 | Discharge: 2022-04-23 | Disposition: A | Discharge status: Home / Self Care | Payer: Medicare Other | Attending: Emergency Medicine | Admitting: Emergency Medicine

## 2022-04-23 ENCOUNTER — Emergency Department (HOSPITAL_BASED_OUTPATIENT_CLINIC_OR_DEPARTMENT_OTHER): Payer: Medicare Other

## 2022-04-23 DIAGNOSIS — Y9259 Other trade areas as the place of occurrence of the external cause: Secondary | ICD-10-CM | POA: Insufficient documentation

## 2022-04-23 DIAGNOSIS — R0689 Other abnormalities of breathing: Secondary | ICD-10-CM | POA: Diagnosis not present

## 2022-04-23 DIAGNOSIS — Z7982 Long term (current) use of aspirin: Secondary | ICD-10-CM | POA: Diagnosis not present

## 2022-04-23 DIAGNOSIS — I959 Hypotension, unspecified: Secondary | ICD-10-CM | POA: Diagnosis not present

## 2022-04-23 DIAGNOSIS — W19XXXA Unspecified fall, initial encounter: Secondary | ICD-10-CM | POA: Diagnosis not present

## 2022-04-23 DIAGNOSIS — I6381 Other cerebral infarction due to occlusion or stenosis of small artery: Secondary | ICD-10-CM | POA: Diagnosis not present

## 2022-04-23 DIAGNOSIS — W01198A Fall on same level from slipping, tripping and stumbling with subsequent striking against other object, initial encounter: Secondary | ICD-10-CM | POA: Diagnosis not present

## 2022-04-23 DIAGNOSIS — S0990XA Unspecified injury of head, initial encounter: Secondary | ICD-10-CM | POA: Insufficient documentation

## 2022-04-23 NOTE — Discharge Instructions (Signed)
Please return for headache confusion or vomiting.

## 2022-04-23 NOTE — ED Provider Notes (Signed)
[facility] EMERGENCY DEPARTMENT Provider Note   CSN: [id number] Arrival date & time: 04/23/22  1521     History  Chief Complaint  Patient presents with   [name]    Candace Hill is a 76 y.o. female.  76 yo F with a chief complaints of a fall.  The patient was at a furniture store and she had tripped over a rug.  She did up striking the left side of her forehead on the ground.  She was a bit disoriented initially.  Denied any vomiting.  Now feels fine.  Denies neck pain chest pain back pain extremity pain.   Fall       Home Medications Prior to Admission medications   Medication Sig Start Date End Date Taking? Authorizing Provider  aspirin EC 81 MG tablet Take 81 mg by mouth daily.    [provider]  atorvastatin (LIPITOR) 10 MG tablet Take 1 tablet (10 mg total) by mouth daily. 03/13/22   Janith Lima, MD  Biotin 800 MCG TABS Take 1 tablet by mouth.    [provider]  CALCIUM-VITAMIN D PO Take 600 mg by mouth daily.    [provider]  DULoxetine (CYMBALTA) 60 MG capsule TAKE 1 CAPSULE BY MOUTH DAILY FOR DEPRESSION 02/20/22   Janith Lima, MD  Ferrous Sulfate (IRON) 325 (65 Fe) MG TABS Take 1 tablet by mouth daily.    [provider]  folic acid (FOLVITE) 382 MCG tablet Take 800 mcg by mouth daily.    [provider]  tacrolimus (PROGRAF) 0.5 MG capsule 6 by mouth daily NAME BRAND ONLY    [provider]      Allergies    Codeine, Other, Penicillins, Phenergan [promethazine], Sulfa antibiotics, Tamsulosin, and Lidocaine    Review of Systems   Review of Systems  Physical Exam Updated Vital Signs BP (!) 173/88 (BP Location: Left Arm)   Pulse 75   Temp 98.4 F (36.9 C) (Oral)   Resp 16   Ht '5\' 3"'$  (1.6 m)   Wt 55.8 kg   SpO2 100%   BMI 21.79 kg/m  Physical Exam Vitals and nursing note reviewed.  Constitutional:      General: She is not in acute distress.    Appearance: She is well-developed.  She is not diaphoretic.  HENT:     Head: Normocephalic and atraumatic.  Eyes:     Pupils: Pupils are equal, round, and reactive to light.  Cardiovascular:     Rate and Rhythm: Normal rate and regular rhythm.     Heart sounds: No murmur heard.    No friction rub. No gallop.  Pulmonary:     Effort: Pulmonary effort is normal.     Breath sounds: No wheezing or rales.  Abdominal:     General: There is no distension.     Palpations: Abdomen is soft.     Tenderness: There is no abdominal tenderness.  Musculoskeletal:        General: No tenderness.     Cervical back: Normal range of motion and neck supple.     Comments: No midline spinal tenderness step-offs or deformities.  She is able to rotate her head 45 degrees in either direction without pain.  Skin:    General: Skin is warm and dry.  Neurological:     Mental Status: She is alert and oriented to person, place, and time.  Psychiatric:        Behavior: Behavior normal.  ED Results / Procedures / Treatments   Labs (all labs ordered are listed, but only abnormal results are displayed) Labs Reviewed - No data to display  EKG None  Radiology CT Head Wo Contrast  Result Date: 04/23/2022 CLINICAL DATA:  Head trauma. Tripped over rug at furniture store hitting left forehead. EXAM: CT HEAD WITHOUT CONTRAST TECHNIQUE: Contiguous axial images were obtained from the base of the skull through the vertex without intravenous contrast. RADIATION DOSE REDUCTION: This exam was performed according to the departmental dose-optimization program which includes automated exposure control, adjustment of the mA and/or kV according to patient size and/or use of iterative reconstruction technique. COMPARISON:  Head CT 07/23/2021 FINDINGS: Brain: No intracranial hemorrhage, mass effect, or midline shift. No hydrocephalus. Similar degree of periventricular chronic small vessel ischemic change. Tiny remote lacunar infarcts in the right basal ganglia and  caudate. The basilar cisterns are patent. No evidence of territorial infarct or acute ischemia. No extra-axial or intracranial fluid collection. Vascular: Atherosclerosis of skullbase vasculature without hyperdense vessel or abnormal calcification. Skull: No fracture or focal lesion. Sinuses/Orbits: Chronic mucosal thickening of paranasal sinuses with postsurgical change. Frothy debris in the ethmoid air cells. There is partial opacification of the right mastoid air cells. Other: None. IMPRESSION: 1. No acute intracranial abnormality. No skull fracture. 2. Unchanged chronic small vessel ischemic change. Tiny remote lacunar infarcts in the right basal ganglia and caudate. Electronically Signed   By: Keith Rake M.D.   On: 04/23/2022 16:00    Procedures Procedures    Medications Ordered in ED Medications - No data to display  ED Course/ Medical Decision Making/ A&P                           Medical Decision Making Amount and/or Complexity of Data Reviewed Radiology: ordered.   36 yoF with a chief complaints of a fall.  This was nonsyncopal by history.  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  She was a bit disoriented immediately after the fall.  CT scan of the head is negative for intracranial pathology.  She has no obvious facial discomfort and says no signs of trauma on exam.  No midline spinal tenderness.  We will discharge her home.  Have her follow-up with her family doctor in the office.  5:52 PM:  I have discussed the diagnosis/risks/treatment options with the patient and family.  Evaluation and diagnostic testing in the emergency department does not suggest an emergent condition requiring admission or immediate intervention beyond what has been performed at this time.  They will follow up with PCP. We also discussed returning to the ED immediately if new or worsening sx occur. We discussed the sx which are most concerning (e.g., sudden worsening pain, fever, inability to  tolerate by mouth) that necessitate immediate return. Medications administered to the patient during their visit and any new prescriptions provided to the patient are listed below.  Medications given during this visit Medications - No data to display   The patient appears reasonably screen and/or stabilized for discharge and I doubt any other medical condition or other [facility] requiring further screening, evaluation, or treatment in the ED at this time prior to discharge.          Final Clinical Impression(s) / ED Diagnoses Final diagnoses:  Fall, initial encounter  Injury of head, initial encounter    Rx / DC Orders ED Discharge Orders  None         Deno Etienne, DO 04/23/22 1752

## 2022-04-23 NOTE — ED Triage Notes (Addendum)
Pt transported by EMS. Reports pt was at furniture store. Pt tripped over rolled up rug. Hit on left forehead. No redness noted on forehead. No loss of consciousness.Not on blood thinners. Recently dx with dementia

## 2022-04-24 ENCOUNTER — Telehealth: Payer: Self-pay | Admitting: Internal Medicine

## 2022-04-24 NOTE — Telephone Encounter (Signed)
PT calls today as a follow up from their recent fall and ED visit. PT tripped and fell onto a metal bar while in the store. PT was not able to get up but did not go unconscious. PT is currently experiencing vertigo and had even taken a fall as recently as this morning. They were informed to get up with Dr.Jones and staff.  CB: [phone]

## 2022-04-27 ENCOUNTER — Ambulatory Visit (INDEPENDENT_AMBULATORY_CARE_PROVIDER_SITE_OTHER): Payer: Medicare Other | Admitting: Internal Medicine

## 2022-04-27 ENCOUNTER — Encounter: Payer: Self-pay | Admitting: Internal Medicine

## 2022-04-27 VITALS — BP 138/82 | HR 75 | Temp 98.0°F | Ht 63.0 in | Wt 126.0 lb

## 2022-04-27 DIAGNOSIS — I6381 Other cerebral infarction due to occlusion or stenosis of small artery: Secondary | ICD-10-CM | POA: Diagnosis not present

## 2022-04-27 DIAGNOSIS — S069X9A Unspecified intracranial injury with loss of consciousness of unspecified duration, initial encounter: Secondary | ICD-10-CM | POA: Diagnosis not present

## 2022-04-27 DIAGNOSIS — I10 Essential (primary) hypertension: Secondary | ICD-10-CM | POA: Diagnosis not present

## 2022-04-27 MED FILL — PROGRAF 0.5 MG CAPSULE: ORAL | 30 days supply | Qty: 180 | Fill #3

## 2022-04-27 NOTE — Progress Notes (Signed)
Subjective:  Patient ID: Candace Hill, female    DOB: 27-Jan-1946  Age: 76 y.o. MRN: [id number]  CC: Head Injury   HPI Candace Hill presents for f/up -  She recently fell and sustained a left frontal head injury with brief LOC.  She was seen in the ED and a CT scan was only remarkable for microvascular disease and remote lacunar infarcts.  Her only lingering symptom is dizziness.  She has had vertigo for many years and this is no different.  She denies changes in her vision, hearing, nausea, vomiting, paresthesias, trouble walking, or trouble talking.  Outpatient Medications Prior to Visit  Medication Sig Dispense Refill   aspirin EC 81 MG tablet Take 81 mg by mouth daily.     atorvastatin (LIPITOR) 10 MG tablet Take 1 tablet (10 mg total) by mouth daily. 90 tablet 1   Biotin 800 MCG TABS Take 1 tablet by mouth.     CALCIUM-VITAMIN D PO Take 600 mg by mouth daily.     DULoxetine (CYMBALTA) 60 MG capsule TAKE 1 CAPSULE BY MOUTH DAILY FOR DEPRESSION 90 capsule 0   Ferrous Sulfate (IRON) 325 (65 Fe) MG TABS Take 1 tablet by mouth daily.     folic acid (FOLVITE) 263 MCG tablet Take 800 mcg by mouth daily.     tacrolimus (PROGRAF) 0.5 MG capsule 6 by mouth daily NAME BRAND ONLY     No facility-administered medications prior to visit.    ROS Review of Systems  Constitutional:  Positive for fatigue. Negative for diaphoresis.  HENT: Negative.    Eyes: Negative.   Respiratory: Negative.  Negative for chest tightness, shortness of breath and wheezing.   Cardiovascular:  Negative for chest pain, palpitations and leg swelling.  Gastrointestinal:  Negative for abdominal pain, diarrhea, nausea and vomiting.  Endocrine: Negative.   Genitourinary: Negative.  Negative for difficulty urinating.  Musculoskeletal: Negative.   Skin: Negative.   Allergic/Immunologic: Negative.   Neurological:  Positive for dizziness. Negative for facial asymmetry, weakness, light-headedness and headaches.   Hematological:  Negative for adenopathy. Does not bruise/bleed easily.  Psychiatric/Behavioral:  Positive for confusion and decreased concentration. Negative for sleep disturbance. The patient is not nervous/anxious.     Objective:  BP 138/82 (BP Location: Right Arm, Patient Position: Sitting, Cuff Size: Large)   Pulse 75   Temp 98 F (36.7 C) (Oral)   Ht '5\' 3"'$  (1.6 m)   Wt 126 lb (57.2 kg)   SpO2 98%   BMI 22.32 kg/m   BP Readings from Last 3 Encounters:  04/27/22 138/82  04/23/22 (!) 173/88  03/12/22 136/86    Wt Readings from Last 3 Encounters:  04/27/22 126 lb (57.2 kg)  04/23/22 123 lb (55.8 kg)  03/12/22 125 lb (56.7 kg)    Physical Exam Vitals reviewed.  HENT:     Nose: Nose normal.     Mouth/Throat:     Mouth: Mucous membranes are moist.  Eyes:     General: No scleral icterus.    Conjunctiva/sclera: Conjunctivae normal.  Cardiovascular:     Rate and Rhythm: Normal rate and regular rhythm.     Heart sounds: No murmur heard. Pulmonary:     Effort: Pulmonary effort is normal.     Breath sounds: No stridor. No wheezing, rhonchi or rales.  Abdominal:     General: Abdomen is flat.     Palpations: There is no mass.     Tenderness: There is no abdominal tenderness. There is no  guarding.     Hernia: No hernia is present.  Musculoskeletal:        General: Normal range of motion.     Cervical back: Neck supple.     Right lower leg: No edema.     Left lower leg: No edema.  Lymphadenopathy:     Cervical: No cervical adenopathy.  Skin:    General: Skin is warm and dry.     Coloration: Skin is not pale.  Neurological:     General: No focal deficit present.     Mental Status: She is alert. Mental status is at baseline.     Cranial Nerves: Cranial nerves 2-12 are intact.     Motor: Motor function is intact. No weakness.     Coordination: Coordination is intact.     Deep Tendon Reflexes: Reflexes normal.  Psychiatric:        Attention and Perception: She is  inattentive.        Mood and Affect: Mood normal.        Speech: Speech is tangential.        Behavior: Behavior normal. Behavior is cooperative.        Thought Content: Thought content normal.        Cognition and Memory: Cognition normal.     Lab Results  Component Value Date   WBC 8.6 01/06/2022   HGB 14.0 01/06/2022   HCT 41 01/06/2022   PLT 226 01/06/2022   GLUCOSE 103 (H) 03/12/2022   CHOL 202 (H) 03/12/2022   TRIG 86.0 03/12/2022   HDL 51.20 03/12/2022   LDLCALC 133 (H) 03/12/2022   ALT 8 01/06/2022   AST 16 01/06/2022   NA 141 03/12/2022   K 3.5 03/12/2022   CL 100 03/12/2022   CREATININE 1.12 03/12/2022   BUN 19 03/12/2022   CO2 32 03/12/2022   TSH 1.91 03/12/2022   INR 0.9 04/15/2021   HGBA1C 5.4 03/12/2022    CT Head Wo Contrast  Result Date: 04/23/2022 CLINICAL DATA:  Head trauma. Tripped over rug at furniture store hitting left forehead. EXAM: CT HEAD WITHOUT CONTRAST TECHNIQUE: Contiguous axial images were obtained from the base of the skull through the vertex without intravenous contrast. RADIATION DOSE REDUCTION: This exam was performed according to the departmental dose-optimization program which includes automated exposure control, adjustment of the mA and/or kV according to patient size and/or use of iterative reconstruction technique. COMPARISON:  Head CT 07/23/2021 FINDINGS: Brain: No intracranial hemorrhage, mass effect, or midline shift. No hydrocephalus. Similar degree of periventricular chronic small vessel ischemic change. Tiny remote lacunar infarcts in the right basal ganglia and caudate. The basilar cisterns are patent. No evidence of territorial infarct or acute ischemia. No extra-axial or intracranial fluid collection. Vascular: Atherosclerosis of skullbase vasculature without hyperdense vessel or abnormal calcification. Skull: No fracture or focal lesion. Sinuses/Orbits: Chronic mucosal thickening of paranasal sinuses with postsurgical change. Frothy  debris in the ethmoid air cells. There is partial opacification of the right mastoid air cells. Other: None. IMPRESSION: 1. No acute intracranial abnormality. No skull fracture. 2. Unchanged chronic small vessel ischemic change. Tiny remote lacunar infarcts in the right basal ganglia and caudate. Electronically Signed   By: Keith Rake M.D.   On: 04/23/2022 16:00    Assessment & Plan:   Lai was seen today for head injury.  Diagnoses and all orders for this visit:  Essential hypertension- Her blood pressure is adequately well controlled.  Head injury with loss of consciousness ([place])-  She is recovering with no evidence of complications.  She has no symptoms that she requests treatment of.  She was given patient education regarding head injury.   I am having Candace Hill "Candace Hill" maintain her folic acid, Iron, aspirin EC, CALCIUM-VITAMIN D PO, tacrolimus, Biotin, DULoxetine, and atorvastatin.  No orders of the defined types were placed in this encounter.    Follow-up: Return if symptoms worsen or fail to improve.  Scarlette Calico, MD

## 2022-04-27 NOTE — Patient Instructions (Signed)
Head Injury, Adult There are many types of head injuries. Head injuries can be as minor as a small bump, or they can be a serious medical issue. More severe head injuries include: A jarring injury to the brain (concussion). A bruise (contusion) of the brain. This means there is bleeding in the brain that can cause swelling. A cracked skull (skull fracture). Bleeding in the brain that collects, clots, and forms a bump (hematoma). After a head injury, most problems occur within the first 24 hours, but side effects may occur up to 7-10 days after the injury. It is important to watch your condition for any changes. You may need to be observed in the emergency department or urgent care, or you may be admitted to the hospital. What are the causes? There are many possible causes of a head injury. Serious head injuries may be caused by car accidents, bicycle or motorcycle accidents, sports injuries, falls, or being struck by an object. What are the symptoms? Symptoms of a head injury include a contusion, bump, or bleeding at the site of the injury. Other physical symptoms may include: Headache. Nausea or vomiting. Dizziness. Blurred or double vision. Being uncomfortable around bright lights or loud noises. Seizures. Feeling tired. Trouble being awakened. Loss of consciousness. Mental or emotional symptoms may include: Irritability. Confusion and memory problems. Poor attention and concentration. Changes in eating or sleeping habits. Anxiety or depression. How is this diagnosed? This condition can usually be diagnosed based on your symptoms, a description of the injury, and a physical exam. You may also have imaging tests done, such as a CT scan or an MRI. How is this treated? Treatment for this condition depends on the severity and type of injury you have. The main goal of treatment is to prevent complications and allow the brain time to heal. Mild head injury If you have a mild head injury,  you may be sent home, and treatment may include: Observation. A responsible adult should stay with you for 24 hours after your injury and check on you often. Physical rest. Brain rest. Pain medicines. Severe head injury If you have a severe head injury, treatment may include: Close observation. This includes hospitalization with the following care: Frequent physical exams. Frequent checks of how your brain and nervous system are working (neurological status). Checking your blood pressure and oxygen levels. Medicines to relieve pain, prevent seizures, and decrease brain swelling. Airway protection and breathing support. This may include using a ventilator. Treatments that monitor and manage swelling inside the brain. Brain surgery. This may be needed to: Remove a collection of blood or blood clots. Stop the bleeding. Remove a part of the skull to allow room for the brain to swell. Follow these instructions at home: Activity Rest and avoid activities that are physically hard or tiring. Make sure you get enough sleep. Let your brain rest by limiting activities that require a lot of thought or attention, such as: Watching TV. Playing memory games and puzzles. Job-related work or homework. Working on the computer, using social media, and texting. Avoid activities that could cause another head injury, such as playing sports, until your health care provider approves. Having another head injury, especially before the first one has healed, can be dangerous. Ask your health care provider when it is safe for you to return to your regular activities, including work or school. Ask your health care provider for a step-by-step plan for gradually returning to activities. Ask your health care provider when you can drive,   ride a bicycle, or use heavy machinery. Your ability to react may be slower after a brain injury. Do not do these activities if you are dizzy. Lifestyle  Do not drink alcohol until  your health care provider approves. Do not use drugs. Alcohol and certain drugs may slow your recovery and can put you at risk of further injury. If it is harder than usual to remember things, write them down. If you are easily distracted, try to do one thing at a time. Talk with family members or close friends when making important decisions. Tell your friends, family, a trusted colleague, and work manager about your injury, symptoms, and restrictions. Have them watch for any new or worsening problems. General instructions Take over-the-counter and prescription medicines only as told by your health care provider. Have someone stay with you for 24 hours after your head injury. This person should watch you for any changes in your symptoms and be ready to seek medical help. Keep all follow-up visits as told by your health care provider. This is important. How is this prevented? Work on improving your balance and strength to avoid falls. Wear a seat belt when you are in a moving vehicle. Wear a helmet when riding a bicycle, skiing, or doing any other sport or activity that has a risk of injury. If you drink alcohol: Limit how much you use to: 0-1 drink a day for nonpregnant women. 0-2 drinks a day for men. Be aware of how much alcohol is in your drink. In the [place] one drink equals one 12 oz bottle of beer (355 mL), one 5 oz glass of wine (148 mL), or one 1 oz glass of hard liquor (44 mL). Take safety measures in your home, such as: Removing clutter and tripping hazards from floors and stairways. Using grab bars in bathrooms and handrails by stairs. Placing non-slip mats on floors and in bathtubs. Improving lighting in dim areas. Where to find more information Centers for Disease Control and Prevention: www.cdc.gov Get help right away if: You have: A severe headache that is not helped by medicine. Trouble walking or weakness in your arms and legs. Clear or bloody fluid coming from your  nose or ears. Changes in your vision. A seizure. Increased confusion or irritability. Your symptoms get worse. You are sleepier than normal and have trouble staying awake. You lose your balance. Your pupils change size. Your speech is slurred. Your dizziness gets worse. You vomit. These symptoms may represent a serious problem that is an emergency. Do not wait to see if the symptoms will go away. Get medical help right away. Call your local emergency services (911 in the [place] Do not drive yourself to the hospital. Summary Head injuries can be minor, or they can be a serious medical issue requiring immediate attention. Treatment for this condition depends on the severity and type of injury you have. Have someone stay with you for 24 hours after your injury and check on you often. Ask your health care provider when it is safe for you to return to your regular activities, including work or school. Head injury prevention includes wearing a seat belt in a motor vehicle, using a helmet on a bicycle, limiting alcohol use, and taking safety measures in your home. This information is not intended to replace advice given to you by your health care provider. Make sure you discuss any questions you have with your health care provider. Document Revised: 04/14/2019 Document Reviewed: 04/14/2019 Elsevier Patient Education  2023   [employer]  

## 2022-04-29 ENCOUNTER — Encounter: Payer: Self-pay | Admitting: Physician Assistant

## 2022-04-29 ENCOUNTER — Ambulatory Visit (INDEPENDENT_AMBULATORY_CARE_PROVIDER_SITE_OTHER): Payer: Medicare Other | Admitting: Physician Assistant

## 2022-04-29 VITALS — BP 141/81 | HR 80 | Resp 18 | Ht 63.0 in | Wt 128.0 lb

## 2022-04-29 DIAGNOSIS — F0670 Mild neurocognitive disorder due to known physiological condition without behavioral disturbance: Secondary | ICD-10-CM

## 2022-04-29 DIAGNOSIS — I6381 Other cerebral infarction due to occlusion or stenosis of small artery: Secondary | ICD-10-CM | POA: Diagnosis not present

## 2022-04-29 MED ORDER — MEMANTINE HCL 5 MG PO TABS: ORAL_TABLET | ORAL | 11 refills | Status: DC

## 2022-04-29 NOTE — Patient Instructions (Addendum)
It was a pleasure to see you today at our office. 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 You also have Adjustment disorder which may affect your memory.   Recommendations:  Follow up in 6  months Stop driving Agree with psychotherapy and psychiatry to help your mood  Start Memantine '5mg'$  tablets.  Take 1 tablet at bedtime for 2 weeks, then 1 tablet twice daily.        Whom to call:  Memory  decline, memory medications: Call our office [phone]   For psychiatric meds, mood meds: Please have your primary care physician manage these medications.    If you have any severe symptoms of a stroke, or other severe issues such as confusion,severe chills or fever, etc call 911 or go to the ER as you may need to be evaluated further   Feel free to visit Facebook page " [email or web address]" for tips of how to care for people with memory problems.        RECOMMENDATIONS FOR ALL PATIENTS WITH MEMORY PROBLEMS: 1. Continue to exercise (Recommend 30 minutes of walking everyday, or 3 hours every week) 2. Increase social interactions - continue going to [place] and enjoy social gatherings with friends and family 3. Eat healthy, avoid fried foods and eat more fruits and vegetables 4. Maintain adequate blood pressure, blood sugar, and blood cholesterol level. Reducing the risk of stroke and cardiovascular disease also helps promoting better memory. 5. Avoid stressful situations. Live a simple life and avoid aggravations. Organize your time and prepare for the next day in anticipation. 6. Sleep well, avoid any interruptions of sleep and avoid any distractions in the bedroom that may interfere with adequate sleep quality 7. Avoid sugar, avoid sweets as there is a strong link between excessive sugar intake, diabetes, and cognitive impairment We discussed the Mediterranean diet, which has been  shown to help patients reduce the risk of progressive memory disorders and reduces cardiovascular risk. This includes eating fish, eat fruits and green leafy vegetables, nuts like almonds and hazelnuts, walnuts, and also use olive oil. Avoid fast foods and fried foods as much as possible. Avoid sweets and sugar as sugar use has been linked to worsening of memory function.  There is always a concern of gradual progression of memory problems. If this is the case, then we may need to adjust level of care according to patient needs. Support, both to the patient and caregiver, should then be put into place.    FALL PRECAUTIONS: Be cautious when walking. Scan the area for obstacles that may increase the risk of trips and falls. When getting up in the mornings, sit up at the edge of the bed for a few minutes before getting out of bed. Consider elevating the bed at the head end to avoid drop of blood pressure when getting up. Walk always in a well-lit room (use night lights in the walls). Avoid area rugs or power cords from appliances in the middle of the walkways. Use a walker or a cane if necessary and consider physical therapy for balance exercise. Get your eyesight checked regularly.  FINANCIAL OVERSIGHT: Supervision, especially oversight when making financial decisions or transactions is also recommended.  HOME SAFETY: Consider the safety of the kitchen when operating appliances like stoves, microwave oven, and blender. Consider having supervision and share cooking responsibilities until no longer able to participate in those. Accidents with firearms and  other hazards in the house should be identified and addressed as well.   ABILITY TO BE LEFT ALONE: If patient is unable to contact 911 operator, consider using LifeLine, or when the need is there, arrange for someone to stay with patients. Smoking is a fire hazard, consider supervision or cessation. Risk of wandering should be assessed by caregiver and if  detected at any point, supervision and safe proof recommendations should be instituted.  MEDICATION SUPERVISION: Inability to self-administer medication needs to be constantly addressed. Implement a mechanism to ensure safe administration of the medications.   DRIVING: Regarding driving, in patients with progressive memory problems, driving will be impaired. We advise to have someone else do the driving if trouble finding directions or if minor accidents are reported. Independent driving assessment is available to determine safety of driving.   If you are interested in the driving assessment, you can contact the following:  [employer] in [place]  [place] [phone]  [place]  [facility] [phone] or [phone]

## 2022-04-29 NOTE — Progress Notes (Signed)
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 RH female with a history of major depressive disorder, history of liver transplant with a history of chronic hepatitis C, history of migraines, hypertension, high glycemia, hyperlipidemia, prior history of asbestos exposure, vitamin D deficiency, vitamin B12 deficiency, story of PCO (right), history of stage IIIb chronic kidney disease, splenic artery aneurysm, and a recent diagnosis of mild cognitive impairment due to multiple etiologies as her neuropsychological evaluation in 03/18/2022 presenting today in follow-up for evaluation of memory loss.  Patient is not on antidementia medication.  She is in the process of establishing a relationship with behavioral therapy, which may be very helpful to improve her memory concerns.     Recommendations:   Follow up in 6  months. Continue to monitor mood as per PCP, patient is on Cymbalta.  Agree with referral to psychotherapy Continue to control cardiovascular risk factors, continue baby aspirin daily Repeat Neuropsych evaluation in 18 to 24 months for clarity of the diagnosis and disease progression Continue B12 supplementation Start Memantine 5 mg, take 1 p.o. nightly for 2 weeks, then increase to 5 mg twice daily if tolerated.  Side effects discussed. Monitor driving    Subjective:   This patient is accompanied in the office by her daughter  who supplements the history. Previous records as well as any outside records available were reviewed prior to todays visit.      Any changes in memory since last visit?"Memory is worse, she does not remember any recent information, forgets days, how to charge the phone, etc."   She does not have much interest in interacting with other people or participating in different functions, although this is not new. She does admit not being interested in doing crosswords or word finding repeats  oneself?  Endorsed, "because I don't comprehend what my daughter is trying to say" Disoriented when walking into a room?  Patient denies   Leaving objects in unusual places? " I moved in June and I have too much stuff and have no furniture, so I have all the stuff out , messy." Ambulates  with difficulty?  She is more cautious to prevent falling due to issues with blood pressure.    Recent falls?  Endorsed.  She had a mechanical fall on 04/23/2022 after she tripped on a rug hitting the left side of her forehead on the ground, while initially disoriented, she was quickly recovered.  No loss of consciousness.  CT of the head was negative for acute findings. History of seizures?   Patient denies   Wandering behavior?  Patient denies   Patient drives?  No longer drives, sometimes she goes to the store, she is to quit soon. Any mood changes since last visit? She is to see a Teacher, music and [place] soon .  Any worsening depression?:  Patient denies   Hallucinations?  Patient denies   Paranoia?  Maybe a little bit. The neighbor was following me, but he moved because I complained   Patient reports that sleeps well without vivid dreams, REM behavior or sleepwalking   History of sleep apnea?  Patient denies   Any hygiene concerns?  Patient denies   Independent of bathing and dressing?  Endorsed  Does the patient needs help with medications?  In charge  Who is in charge of the finances?  Daughter monitors the finances    Any changes in appetite?  Patient denies but she likes sweets  Patient have trouble swallowing? Patient denies   Does the patient cook?  Patient denies   Any kitchen accidents such as leaving the stove on? Patient denies   Any headaches?  She has a history of migraine headaches Double vision? Patient denies   Any focal numbness or tingling?  Patient denies   Chronic back pain Patient denies   Unilateral weakness?  Patient denies   Any tremors?  Patient denies   Any history of anosmia?   Patient denies   Any incontinence of urine?  Patient denies   Any bowel dysfunction?   Patient denies      Patient lives   In June she moved [name]  in [place]      Initial Visit [name] 7/7/21This is a pleasant 76 year old right-handed woman with a history of hypertension, hepatitis C, liver cancer s/p liver transplant, left retinal vein occlusion, TIA, presenting for evaluation of memory loss. She is verbose and slightly tangential. She saw her PCP in January 2021 reporting that her nurse friend is concerned about her memory. She was told she should be getting more things done and is having trouble with this. She was in the process of moving and was having a hard time, she moved last December. She says  "I've always pushed myself too much." She has noticed memory changes herself, she cannot remember things in a row. 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 She lives alone. She denies getting lost driving. She denies missing medications. She has to juggle things so she could keep up with things in general. MMSE 28/30 in July 2020. She now lives in senior independent living at [facility]. She states she was adopted, and that her mother had Alzheimer's disease. She denies any significant head injuries. No alcohol use.    She is supposed to follow-up in [facility] for her liver transplant, but keeps saying that [facility] screwed up. She states "they went me the fake antirejection medication" and Medicare called her about it, she was sent the generic that is cheaper. She reports left retinal vein occlusion as a result of her steroids for her transplant. She has 7% vision on the left. She recalls and ruminates on a fall in February 2020 when she was in the bank and broke her nose and 2 front teeth, saying "someone caused me to have an accident." She states she has not been okay since then. She falls more, feeling dizzy with  spinning sensation. She cannot climb steps because she gets very dizzy. She reports an incident last week on the phone, she could not understand anything she was trying to say, gibberish was coming out. This lasted a few hours, the next morning she started to talk. She recalls having a headache at that time. She denied any focal weakness. She started B12 supplements in March 2021, B12 level was 248.     Neurocognitive testing 9 06/17/2019, Dr. Melvyn Novas results suggested deficits primarily surrounding phonemic fluency and encoding/retrieval aspects of memory. Performance variability was further noted across processing speed, executive functioning, and retention/consolidation aspects of memory. Regarding etiology, the most likely culprit for ongoing cognitive dysfunction is vascular in nature (i.e., mild vascular neurocognitive disorder). There appears to be some confusion regarding Ms. Mustapha's stroke history. She was quite adamant that she was told that she has experienced three "mini-strokes" in her lifetime and that these were previously confirmed by a un-named  physician. The term "mini-stroke" is problematic due to its imprecision and likely should not be used in clinical capacities as it is unclear if this represents prior TIAs (i.e., temporary blockages that can result in short-term stroke-like symptoms which do not show changes on neuroimaging) or actual very small strokes. A brain MRI on 01/16/2015 did reveal a small focal lesion in the right genu of the corpus callosum which could represent a very small ischemic infarct. This lesion is visible on her more recent brain MRI (01/21/2020) but is not commented on. Looking at her 2016 MRI, it is also worth pointing out the potential for an additional small left parieto-temporal infarct around the area of the angular gyrus which would be chronic in nature. Overall, while unclear, there does seem to be the potential that Ms. Tigert has experienced at least one very small  stroke in the past.       TSH 2.98 09/06/2019       Neuropsychological evaluation, 03/18/2022, Dr. Melvyn Novas briefly, results suggested primary impairments surrounding semantic fluency, receptive language, and all aspects of verbal learning and memory. Additional performance variability was exhibited across processing speed, executive functioning, phonemic fluency, and visual learning and memory. Relative to her previous evaluation in August 2021, performance declines were exhibited across several domains. This was most prominent across executive functioning, receptive language, and semantic fluency. Declines were also evidence across aspects of memory, particularly delayed retrieval aspects of verbal memory tasks. Other assessed domains generally exhibited stability. The etiology of ongoing dysfunction is unclear as patterns across testing continue to be somewhat nonspecific. Previously, I theorized a primary vascular contribution given medical ailments, microvascular ischemic disease, and prior lacunar infarct. A vascular contribution certainly remains a strong possibility. However, generally speaking, this would not yield progressively worsening cognitive abilities with no reported vascular or other medical event in the interim. The presence of an underlying neurodegenerative condition remains a possibility. Of these illnesses, Alzheimer's disease would be most likely. Across verbal memory tasks, she did not benefit from repeated exposure to information, exhibited prominent difficulties recalling previously learned information after a brief delay, and performed very poorly across yes/no recognition trials. This could suggest rapid forgetting and an evolving storage impairment, which are hallmark signs of this illness. Impairment and progressive decline in semantic fluency would represent typical disease progression. However, visual memory scores remained generally adequate and performance across confrontation  naming was also adequate. While concerns are reasonable, she does not exhibit a typical Alzheimer's disease profile across testing at the present time.   Personally reviewed CT of the head 04/23/2022 shows unchanged chronic small vessel ischemic change. Tiny remote lacunar infarcts in the right basal ganglia and caudate.    Past Medical History:  Diagnosis Date   Acute cystitis without hematuria 03/12/2022   Allergic rhinitis 11/14/2012   Amblyopia of eye, left 06/17/2017   Asbestos exposure 04/08/2018   Asthma    1994-1994 due to black mold house, no asthma now   Broken ankle    right   Broken rib    x2   Cataract    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 03/26/2011   Chronic hepatitis C virus infection 66/29/4765   Complication of anesthesia    IV phenergan  serious mental reaction   Delusional thoughts 03/18/2022   Diarrhea 03/13/2022   Essential hypertension 10/14/2017   Gastroesophageal reflux disease 10/14/2017   Generalized anxiety disorder    Hernia, femoral    left   History  of blood transfusion 1971   acquired hepatitis c   History of kidney stones    History of liver transplant 11/14/2012   History of nephrolithiasis 10/14/2017   History of renal stone    History of shingles    Hyperglycemia 04/08/2018   Hyperlipidemia with target LDL less than 130 01/15/2021   The 10-year ASCVD risk score (Arnett DK, et al., 2019) is: 16.2%   Values used to calculate the score:     Age: 51 years     Sex: Female     Is Non-Hispanic African American: No     Diabetic: No     Tobacco smoker: No     Systolic Blood Pressure: 923 mmHg     Is BP treated: No     HDL Cholesterol: 51.2 mg/dL     Total Cholesterol: 202 mg/dL   Intrinsic eczema 01/15/2021   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-will get Avastin injections off/on   Vitamin D deficiency 04/08/2018   Weight loss, non-intentional 03/12/2022     Past Surgical History:  Procedure Laterality Date   CATARACT EXTRACTION Bilateral    CHOLECYSTECTOMY  1997   CYSTOSCOPY W/ URETERAL STENT PLACEMENT Left 08/14/2016   Procedure: CYSTOSCOPY WITH RETROGRADE PYELOGRAM/URETERAL LEFT URETEROSCOPY AND LEFT STENT PLACEMENT;  Surgeon: Raynelle Bring, MD;  Location: [facility];  Service: Urology;  Laterality: Left;   CYSTOSCOPY/URETEROSCOPY/HOLMIUM LASER/STENT PLACEMENT Left 07/06/2016   Procedure: CYSTOSCOPY/URETEROSCOPY/ BILATERAL RETROGRADE/HOLMIUM LASER/STENT PLACEMENT/BASKET STONE REMOVAL;  Surgeon: Raynelle Bring, MD;  Location: [facility];  Service: Urology;  Laterality: Left;   CYSTOSCOPY/URETEROSCOPY/HOLMIUM LASER/STENT PLACEMENT Left 08/06/2016   Procedure: CYSTOSCOPY/URETEROSCOPY/HOLMIUM LASER/STENT PLACEMENT;  Surgeon: Raynelle Bring, MD;  Location: [facility];  Service: Urology;  Laterality: Left;   CYSTOSCOPY/URETEROSCOPY/HOLMIUM LASER/STENT PLACEMENT Left 08/31/2016   Procedure: CYSTOSCOPY/URETEROSCOPY/HOLMIUM LASER/STENT PLACEMENT/BASKET STONE REMOVAL;  Surgeon: Raynelle Bring, MD;  Location: [facility];  Service: Urology;  Laterality: Left;   FEMORAL HERNIA REPAIR Left 1997   left leg-   LIVER TRANSPLANTATION     NASAL SINUS SURGERY  1997   x3   TONSILLECTOMY  age 27   TUBAL LIGATION       PREVIOUS MEDICATIONS:   CURRENT MEDICATIONS:  Outpatient Encounter Medications as of 04/29/2022  Medication Sig   aspirin EC 81 MG tablet Take 81  mg by mouth daily.   atorvastatin (LIPITOR) 10 MG tablet Take 1 tablet (10 mg total) by mouth daily.   Biotin 800 MCG TABS Take 1 tablet by mouth.   CALCIUM-VITAMIN D PO Take 600 mg by mouth daily.   DULoxetine (CYMBALTA) 60 MG capsule TAKE 1 CAPSULE BY MOUTH DAILY FOR DEPRESSION   Ferrous Sulfate (IRON) 325 (65 Fe) MG TABS Take 1 tablet by mouth daily.   folic acid (FOLVITE) 300 MCG tablet Take 800 mcg by mouth daily.   tacrolimus (PROGRAF) 0.5 MG capsule 6 by mouth daily NAME BRAND  ONLY   No facility-administered encounter medications on file as of 04/29/2022.     Objective:     PHYSICAL EXAMINATION:    VITALS:   Vitals:   04/29/22 1256  BP: (!) 141/81  Pulse: 80  Resp: 18  SpO2: 99%  Weight: 128 lb (58.1 kg)  Height: '5\' 3"'$  (1.6 m)    GEN:  The patient appears stated age and is in NAD. HEENT:  Normocephalic, atraumatic.   Neurological examination:  General: NAD, well-groomed, appears stated age. Orientation: The patient is alert. Oriented to person, place and date Cranial nerves: There is good facial symmetry.The speech is fluent and clear. No aphasia or dysarthria. Fund of knowledge is appropriate. Recent memory impaired and remote memory is normal.  Attention and concentration are normal.  Able to name objects and repeat phrases.  Hearing is intact to conversational tone.    Sensation: Sensation is intact to light touch throughout Motor: Strength is at least antigravity x4. Tremors: none  DTR's 2/4 in UE/LE      07/29/2021   11:00 AM  Montreal Cognitive Assessment   Visuospatial/ Executive (0/5) 4  Naming (0/3) 3  Attention: Read list of digits (0/2) 2  Attention: Read list of letters (0/1) 1  Attention: Serial 7 subtraction starting at 100 (0/3) 1  Language: Repeat phrase (0/2) 2  Language : Fluency (0/1) 1  Abstraction (0/2) 2  Delayed Recall (0/5) 0  Orientation (0/6) 6  Total 22  Adjusted Score (based on education) 22       12/23/2018    9:09 AM  12/20/2017   11:44 AM  MMSE - Mini Mental State Exam  Orientation to time 4 4  Orientation to Place 4 4  Registration 3 3  Attention/ Calculation 5 5  Recall 3 3  Language- name 2 objects 2 2  Language- repeat 1 1  Language- follow 3 step command 3 3  Language- read & follow direction 1 1  Write a sentence 1 1  Copy design 1 1  Total score 28 28       Movement examination: Tone: There is normal tone in the UE/LE Abnormal movements:  no tremor.  No myoclonus.  No asterixis.   Coordination:  There is no decremation with RAM's. Normal finger to nose  Gait and Station: The patient has no difficulty arising out of a deep-seated chair without the use of the hands. The patient's stride length is good.  Gait is cautious and narrow.   Thank you for allowing [place] the opportunity to participate in the care of this nice patient. Please do not hesitate to contact us for any questions or concerns.   Total time spent on today's visit was 44 minutes dedicated to this patient today, preparing to see patient, examining the patient, ordering tests and/or medications and counseling the patient, documenting clinical information in the EHR or other health record, independently interpreting results and communicating results to the patient/family, discussing treatment and goals, answering patient's questions and coordinating care.  Cc:  Janith Lima, MD  Sharene Butters 04/29/2022 1:27 PM

## 2022-05-06 ENCOUNTER — Other Ambulatory Visit: Payer: Self-pay

## 2022-05-06 ENCOUNTER — Telehealth: Payer: Self-pay | Admitting: Internal Medicine

## 2022-05-06 ENCOUNTER — Emergency Department (HOSPITAL_BASED_OUTPATIENT_CLINIC_OR_DEPARTMENT_OTHER)
Admission: EM | Admit: 2022-05-06 | Discharge: 2022-05-06 | Disposition: A | Discharge status: Home / Self Care | Payer: Medicare Other | Attending: Emergency Medicine | Admitting: Emergency Medicine

## 2022-05-06 ENCOUNTER — Encounter (HOSPITAL_BASED_OUTPATIENT_CLINIC_OR_DEPARTMENT_OTHER): Payer: Self-pay | Admitting: Emergency Medicine

## 2022-05-06 DIAGNOSIS — Z7982 Long term (current) use of aspirin: Secondary | ICD-10-CM | POA: Insufficient documentation

## 2022-05-06 DIAGNOSIS — K921 Melena: Secondary | ICD-10-CM | POA: Diagnosis not present

## 2022-05-06 DIAGNOSIS — R197 Diarrhea, unspecified: Secondary | ICD-10-CM | POA: Diagnosis not present

## 2022-05-06 DIAGNOSIS — R195 Other fecal abnormalities: Secondary | ICD-10-CM

## 2022-05-06 LAB — COMPREHENSIVE METABOLIC PANEL
ALT: 7 U/L (ref 0–44)
AST: 15 U/L (ref 15–41)
Albumin: 4.8 g/dL (ref 3.5–5.0)
Alkaline Phosphatase: 49 U/L (ref 38–126)
Anion gap: 9 (ref 5–15)
BUN: 16 mg/dL (ref 8–23)
CO2: 28 mmol/L (ref 22–32)
Calcium: 10 mg/dL (ref 8.9–10.3)
Chloride: 104 mmol/L (ref 98–111)
Creatinine, Ser: 1.13 mg/dL — ABNORMAL HIGH (ref 0.44–1.00)
GFR, Estimated: 51 mL/min — ABNORMAL LOW (ref >60)
Glucose, Bld: 101 mg/dL — ABNORMAL HIGH (ref 70–99)
Potassium: 3.8 mmol/L (ref 3.5–5.1)
Sodium: 141 mmol/L (ref 135–145)
Total Bilirubin: 0.9 mg/dL (ref 0.3–1.2)
Total Protein: 7.5 g/dL (ref 6.5–8.1)

## 2022-05-06 LAB — URINALYSIS, ROUTINE W REFLEX MICROSCOPIC
Bilirubin Urine: NEGATIVE
Glucose, UA: NEGATIVE mg/dL
Hgb urine dipstick: NEGATIVE
Ketones, ur: NEGATIVE mg/dL
Nitrite: NEGATIVE
Specific Gravity, Urine: 1.02 (ref 1.005–1.030)
pH: 5.5 (ref 5.0–8.0)

## 2022-05-06 LAB — CBC
HCT: 40.8 % (ref 36.0–46.0)
Hemoglobin: 14.3 g/dL (ref 12.0–15.0)
MCH: 32.1 pg (ref 26.0–34.0)
MCHC: 35 g/dL (ref 30.0–36.0)
MCV: 91.7 fL (ref 80.0–100.0)
Platelets: 206 10*3/uL (ref 150–400)
RBC: 4.45 MIL/uL (ref 3.87–5.11)
RDW: 12.6 % (ref 11.5–15.5)
WBC: 8.1 10*3/uL (ref 4.0–10.5)
nRBC: 0 % (ref 0.0–0.2)

## 2022-05-06 LAB — LIPASE, BLOOD: Lipase: 13 U/L (ref 11–51)

## 2022-05-06 NOTE — ED Provider Notes (Signed)
[facility] EMERGENCY DEPT Provider Note   CSN: [id number] Arrival date & time: 05/06/22  1427     History  Chief Complaint  Patient presents with   Abdominal Pain    Candace Hill is a 76 y.o. female.  HPI 75 year old female with a history of a liver transplant presents for evaluation of black stools.  History is from patient and family at the bedside.  She has a remote history of varices, estimating it has been over 10 years ago.  She had some black stools a month or so ago and brought up to her neurologist but no further workup was pursued.  However in the middle of the night last night she had to get up to go to the bathroom and had numerous episodes of diarrheal bowel movements which she describes as black and smelly.  Since then she has been feeling fine.  No abdominal pain during this time.  No vomiting or dizziness.  She had a smaller but more normal bowel movement later on today and it was normal in color and appearance.  Home Medications Prior to Admission medications   Medication Sig Start Date End Date Taking? Authorizing Provider  aspirin EC 81 MG tablet Take 81 mg by mouth daily.    [provider]  atorvastatin (LIPITOR) 10 MG tablet Take 1 tablet (10 mg total) by mouth daily. 03/13/22   Janith Lima, MD  Biotin 800 MCG TABS Take 1 tablet by mouth.    [provider]  CALCIUM-VITAMIN D PO Take 600 mg by mouth daily.    [provider]  DULoxetine (CYMBALTA) 60 MG capsule TAKE 1 CAPSULE BY MOUTH DAILY FOR DEPRESSION 02/20/22   Janith Lima, MD  Ferrous Sulfate (IRON) 325 (65 Fe) MG TABS Take 1 tablet by mouth daily.    [provider]  folic acid (FOLVITE) 893 MCG tablet Take 800 mcg by mouth daily.    [provider]  memantine (NAMENDA) 5 MG tablet Take 1 tablet (5 mg at night) for 2 weeks, then increase to 1 tablet (5 mg) twice a day 04/29/22   Rondel Jumbo, PA-C  tacrolimus (PROGRAF) 0.5 MG capsule 6  by mouth daily NAME BRAND ONLY    [provider]      Allergies    Codeine, Other, Penicillins, Phenergan [promethazine], Sulfa antibiotics, Tamsulosin, and Lidocaine    Review of Systems   Review of Systems  Gastrointestinal:  Positive for blood in stool. Negative for abdominal pain and vomiting.  Neurological:  Negative for light-headedness.    Physical Exam Updated Vital Signs BP (!) 171/88 (BP Location: Right Arm)   Pulse 73   Temp 98.5 F (36.9 C) (Oral)   Resp 16   Ht '5\' 3"'$  (1.6 m)   Wt 55.3 kg   SpO2 100%   BMI 21.61 kg/m  Physical Exam Vitals and nursing note reviewed.  Constitutional:      Appearance: She is well-developed.  HENT:     Head: Normocephalic and atraumatic.  Cardiovascular:     Rate and Rhythm: Normal rate and regular rhythm.     Heart sounds: Normal heart sounds.  Pulmonary:     Effort: Pulmonary effort is normal.     Breath sounds: Normal breath sounds.  Abdominal:     General: There is no distension.     Palpations: Abdomen is soft.     Tenderness: There is no abdominal tenderness.  Skin:    General: Skin is  warm and dry.  Neurological:     Mental Status: She is alert.     ED Results / Procedures / Treatments   Labs (all labs ordered are listed, but only abnormal results are displayed) Labs Reviewed  COMPREHENSIVE METABOLIC PANEL - Abnormal; Notable for the following components:      Result Value   Glucose, Bld 101 (*)    Creatinine, Ser 1.13 (*)    GFR, Estimated 51 (*)    All other components within normal limits  URINALYSIS, ROUTINE W REFLEX MICROSCOPIC - Abnormal; Notable for the following components:   APPearance HAZY (*)    Protein, ur TRACE (*)    Leukocytes,Ua TRACE (*)    All other components within normal limits  LIPASE, BLOOD  CBC    EKG None  Radiology No results found.  Procedures Procedures    Medications Ordered in ED Medications - No data to display  ED Course/ Medical Decision Making/  A&P                           Medical Decision Making Amount and/or Complexity of Data Reviewed Labs:     Details: Hemoglobin normal, stable compared to baseline. Platelets normal. LFTs normal. Cr stable.    Family's primary concern appears to be whether she has esophageal varices.  I think this is unlikely in this scenario.  She has already had a normal bowel movement and the black bowel movement is over.  Her hemoglobin is normal and stable compared to her baseline.  Abdominal exam is benign.  No vomiting.  I offered to do a rectal exam and occult blood testing but patient and family declined.  At this point, I think she is stable for outpatient follow-up with [facility] gastroenterology, who she has been referred to and is seeing next month.  However I do not think she needs emergent endoscopy or treatment.  Will give return precautions but she appears stable for discharge.        Final Clinical Impression(s) / ED Diagnoses Final diagnoses:  Dark stools    Rx / DC Orders ED Discharge Orders     None         Sherwood Gambler, MD 05/06/22 1650

## 2022-05-06 NOTE — ED Triage Notes (Signed)
Abd pain with dark tarry stools x 1 week.

## 2022-05-06 NOTE — Discharge Instructions (Signed)
If you develop abdominal pain, new or worsening black stools, bloody stools, dizziness or lightheadedness, or any other new/concerning symptoms then return to the ER for evaluation.  Otherwise follow-up with [facility] gastroenterology as scheduled

## 2022-05-06 NOTE — Telephone Encounter (Signed)
Patient called and said that her daughter told her to call because this morning she was using the bathroom and she said her stool was very liquidy and black. I transferred to the triage nurse for further eval

## 2022-05-19 ENCOUNTER — Other Ambulatory Visit: Payer: Self-pay | Admitting: Internal Medicine

## 2022-05-19 DIAGNOSIS — F339 Major depressive disorder, recurrent, unspecified: Secondary | ICD-10-CM

## 2022-05-19 DIAGNOSIS — F419 Anxiety disorder, unspecified: Secondary | ICD-10-CM

## 2022-05-21 NOTE — Unmapped (Signed)
[facility] Refill Coordination Note    Specialty Medication(s) to be Shipped:   Transplant: Prograf 0.5mg     Other medication(s) to be shipped: No additional medications requested for fill at this time     Kristy Hanson, DOB: Jun 26, 1945  Phone: [phone] (home)       All above HIPAA information was verified with patient.     Was a Nurse, learning disability used for this call? No    Completed refill call assessment today to schedule patient's medication shipment from the [facility] [phone]).  All relevant notes have been reviewed.     Specialty medication(s) and dose(s) confirmed: Regimen is correct and unchanged.   Changes to medications: Kristy Hanson reports no changes at this time.  Changes to insurance: No  New side effects reported not previously addressed with a pharmacist or physician: None reported  Questions for the pharmacist: No    Confirmed patient received a Conservation officer, historic buildings and a Surveyor, mining with first shipment. The patient will receive a drug information handout for each medication shipped and additional FDA Medication Guides as required.       DISEASE/MEDICATION-SPECIFIC INFORMATION        N/A    SPECIALTY MEDICATION ADHERENCE     Medication Adherence    Patient reported X missed doses in the last month: 0  Specialty Medication: PROGRAF 0.5 MG capsule (tacrolimus)  Patient is on additional specialty medications: No                                Were doses missed due to medication being on hold? No    Prograf 0.5 mg: 10 days of medicine on hand       REFERRAL TO PHARMACIST     Referral to the pharmacist: Not needed      [facility]     Shipping address confirmed in Epic.     Delivery Scheduled: Yes, Expected medication delivery date: 05/27/22.     Medication will be delivered via UPS to the prescription address in Epic WAM.    Kristy Hanson   [facility] Specialty Technician

## 2022-05-26 ENCOUNTER — Ambulatory Visit: Payer: Medicare Other | Admitting: Gastroenterology

## 2022-05-26 MED FILL — PROGRAF 0.5 MG CAPSULE: ORAL | 30 days supply | Qty: 180 | Fill #4

## 2022-05-26 NOTE — Progress Notes (Deleted)
[facility] Consult Note:  History: Candace Hill 05/26/2022  Referring provider: Janith Lima, MD  Reason for consult/chief complaint: No chief complaint on file.   Subjective  HPI: Has been since 2024 altered bowel habits with stools of various consistency.  Most recently seen June 2022 with similar complaints.  Colonoscopy 01/20/2021 normal. 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  At the time of last visit with me, she had not followed up with [facility] transplant, they were refilling her Prograf.  She was disinclined to follow-up with them due to COVID concerns and exposure to healthcare environments.  EMR encounter reviewed appears to show her following with [facility] transplant team by phone calls in telemedicine, and they have been describing her Prograf and checking levels.  ED visit 05/06/2022 with patient reporting loose black foul-smelling stool with concerns may be GI bleeding.  Provider note indicates patient been reporting a normal character bowel movement that same day prior to arrival in ED.  Patient declined rectal exam. ***   ROS:  Review of Systems   Past Medical History: Past Medical History:  Diagnosis Date   Acute cystitis without hematuria 03/12/2022   Allergic rhinitis 11/14/2012   Amblyopia of eye, left 06/17/2017   Asbestos exposure 04/08/2018   Asthma    [id number] due to black mold house, no asthma now   Broken ankle    right   Broken rib    x2   Cataract    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 03/26/2011   Chronic hepatitis C virus infection 68/34/1962   Complication of anesthesia    IV phenergan  serious mental reaction   Delusional thoughts 03/18/2022   Diarrhea 03/13/2022   Essential hypertension 10/14/2017   Gastroesophageal reflux disease 10/14/2017   Generalized anxiety disorder    Hernia, femoral    left   History of blood transfusion 1971    acquired hepatitis c   History of kidney stones    History of liver transplant 11/14/2012   History of nephrolithiasis 10/14/2017   History of renal stone    History of shingles    Hyperglycemia 04/08/2018   Hyperlipidemia with target LDL less than 130 01/15/2021   The 10-year ASCVD risk score (Arnett DK, et al., 2019) is: 16.2%   Values used to calculate the score:     Age: 76 years     Sex: Female     Is Non-Hispanic African American: No     Diabetic: No     Tobacco smoker: No     Systolic Blood Pressure: 229 mmHg     Is BP treated: No     HDL Cholesterol: 51.2 mg/dL     Total Cholesterol: 202 mg/dL   Intrinsic eczema 01/15/2021   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  Past Surgical History: Past Surgical History:  Procedure Laterality Date   CATARACT EXTRACTION Bilateral    CHOLECYSTECTOMY  1997   CYSTOSCOPY W/ URETERAL STENT PLACEMENT Left 08/14/2016   Procedure: CYSTOSCOPY WITH RETROGRADE PYELOGRAM/URETERAL LEFT URETEROSCOPY AND LEFT STENT PLACEMENT;  Surgeon: Raynelle Bring, MD;  Location: [facility];  Service: Urology;  Laterality: Left;   CYSTOSCOPY/URETEROSCOPY/HOLMIUM  LASER/STENT PLACEMENT Left 07/06/2016   Procedure: CYSTOSCOPY/URETEROSCOPY/ BILATERAL RETROGRADE/HOLMIUM LASER/STENT PLACEMENT/BASKET STONE REMOVAL;  Surgeon: Raynelle Bring, MD;  Location: [facility];  Service: Urology;  Laterality: Left;   CYSTOSCOPY/URETEROSCOPY/HOLMIUM LASER/STENT PLACEMENT Left 08/06/2016   Procedure: CYSTOSCOPY/URETEROSCOPY/HOLMIUM LASER/STENT PLACEMENT;  Surgeon: Raynelle Bring, MD;  Location: [facility];  Service: Urology;  Laterality: Left;   CYSTOSCOPY/URETEROSCOPY/HOLMIUM LASER/STENT PLACEMENT Left 08/31/2016   Procedure: CYSTOSCOPY/URETEROSCOPY/HOLMIUM LASER/STENT PLACEMENT/BASKET STONE REMOVAL;  Surgeon: Raynelle Bring, MD;  Location: [facility];  Service: Urology;  Laterality: Left;   FEMORAL HERNIA REPAIR Left 1997   left leg-   LIVER TRANSPLANTATION     NASAL SINUS SURGERY  1997   x3   TONSILLECTOMY  age 6   TUBAL LIGATION       Family History: Family History  Adopted: Yes  Problem Relation Age of Onset   Stroke Mother    Alzheimer's disease Mother    Cancer Mother    Diabetes Mother        Questionable, per new patient packet    Arthritis Mother        Questionable, per new patient packet    Alcoholism Father    Suicidality Sister    Depression Sister    COPD Sister    COPD Brother        Heavy smoker, per new patient packet    OCD Daughter    Personality disorder Daughter    Alcoholism Son    Bipolar disorder Granddaughter        Age 65 as of 07/10/2020   Schizophrenia Granddaughter     Social History: Social History   Socioeconomic History   Marital status: Divorced    Spouse name: Not on file   Number of children: 2   Years of education: 14   Highest education level: Futures trader degree: occupational, Hotel manager, or vocational program  Occupational History   Occupation: Disabled  Tobacco Use   Smoking status: Never   Smokeless tobacco: Never  Vaping Use   Vaping Use: Never used  Substance and Sexual Activity   Alcohol use: No   Drug use: No    Sexual activity: Never    Comment: 14 years ago  Other Topics Concern   Not on file  Social History Narrative   Lives at alone alone.   Right-handed.   No caffeine use.      PSC- as of 10/12/17   Diet: No Fried Foods       Caffeine: Summer time, sweet tea       Married, if yes what year: Divorced       Do you live in a house, apartment, assisted living, condo, trailer, ect: [place], 1 stories, and 1 person       Pets: 1 dog       Current/Past profession: Retail buyer       Exercise: Some, walking dog       Living Will: yes  DNR: yes   POA/HPOA: yes      Functional Status: Patient did not answer   Do you have difficulty bathing or dressing yourself?   Do you have difficulty preparing food or eating?   Do you have difficulty managing your medications?   Do you have difficulty managing your finances?   Do you have difficulty affording your medications?   Social Determinants of Health   Financial Resource Strain: Low Risk  (03/10/2021)   Overall Financial Resource Strain (CARDIA)    Difficulty of Paying Living Expenses: Not very hard  Food Insecurity: No Food Insecurity (03/10/2021)   Hunger Vital Sign    Worried About Running Out of Food in the Last Year: Never true    Ran Out of Food in the Last Year: Never true  Transportation Needs: No Transportation Needs (03/10/2021)   PRAPARE - Hydrologist (Medical): No    Lack of Transportation (Non-Medical): No  Physical Activity: Insufficiently Active (03/10/2021)   Exercise Vital Sign    Days of Exercise per Week: 1 day    Minutes of Exercise per Session: 10 min  Stress: Stress Concern Present (03/10/2021)   [place]    Feeling of Stress : Very much  Social Connections: Socially Isolated (03/10/2021)   Social Connection and Isolation Panel [NHANES]    Frequency of Communication with Friends and Family: Never    Frequency  of Social Gatherings with Friends and Family: Never    Attends Religious Services: Never    Marine scientist or Organizations: No    Attends Archivist Meetings: Never    Marital Status: Divorced    Allergies: Allergies  Allergen Reactions   Codeine Nausea Only   Other Other (See Comments)    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     Penicillins Nausea Only and Other (See Comments)    Has patient had a PCN reaction causing immediate rash, facial/tongue/throat swelling, SOB or lightheadedness with hypotension: No Has patient had a PCN reaction causing severe rash involving mucus membranes or skin necrosis: No Has patient had a PCN reaction that required hospitalization No Has patient had a PCN reaction occurring within the last 10 years: No If all of the above answers are "NO", then may proceed with Cephalosporin use.   Phenergan [Promethazine] Other (See Comments)    Reaction:  Hallucinations  Pt states that she is only allergic to IV form.    Sulfa Antibiotics Itching   Tamsulosin Itching   Lidocaine Palpitations    Outpatient Meds: Current Outpatient Medications  Medication Sig Dispense Refill   aspirin EC 81 MG tablet Take 81 mg by mouth daily.     atorvastatin (LIPITOR) 10 MG tablet Take 1 tablet (10 mg total) by mouth daily. 90 tablet 1   Biotin 800 MCG TABS Take 1 tablet by mouth.     CALCIUM-VITAMIN D PO Take 600 mg by mouth daily.     DULoxetine (CYMBALTA) 60 MG capsule TAKE 1 CAPSULE BY MOUTH DAILY FOR DEPRESSION 90 capsule 0   Ferrous Sulfate (IRON) 325 (65 Fe) MG TABS Take 1 tablet by mouth daily.     folic acid (FOLVITE) 448 MCG tablet Take 800 mcg by mouth daily.     memantine (NAMENDA) 5 MG tablet Take 1 tablet (5 mg at night) for 2 weeks, then increase to 1 tablet (5 mg) twice a day 60 tablet 11  tacrolimus (PROGRAF) 0.5 MG capsule 6 by mouth daily NAME BRAND ONLY     No current facility-administered medications for this visit.       ___________________________________________________________________ Objective   Exam:  There were no vitals taken for this visit. Wt Readings from Last 3 Encounters:  05/06/22 122 lb (55.3 kg)  04/29/22 128 lb (58.1 kg)  04/27/22 126 lb (57.2 kg)    General: ***  Eyes: sclera anicteric, no redness ENT: oral mucosa moist without lesions, no cervical or supraclavicular lymphadenopathy CV: ***, no JVD, no peripheral edema Resp: clear to auscultation bilaterally, normal RR and effort noted GI: soft, *** tenderness, with active bowel sounds. No guarding or palpable organomegaly noted. Skin; warm and dry, no rash or jaundice noted Neuro: awake, alert and oriented x 3. Normal gross motor function and fluent speech  Labs:     Latest Ref Rng & Units 05/06/2022    2:55 PM 01/06/2022   12:00 AM 07/23/2021   10:00 AM  CBC  WBC 4.0 - 10.5 K/uL 8.1  8.6     9.7   Hemoglobin 12.0 - 15.0 g/dL 14.3  14.0     13.9   Hematocrit 36.0 - 46.0 % 40.8  41     40.2   Platelets 150 - 400 K/uL 206  226     385      This result is from an external source.      Latest Ref Rng & Units 05/06/2022    2:55 PM 03/12/2022    3:39 PM 01/06/2022   12:00 AM  CMP  Glucose 70 - 99 mg/dL 101  103    BUN 8 - 23 mg/dL '16  19  14      '$ Creatinine 0.44 - 1.00 mg/dL 1.13  1.12  1.2      Sodium 135 - 145 mmol/L 141  141  139      Potassium 3.5 - 5.1 mmol/L 3.8  3.5  3.5      Chloride 98 - 111 mmol/L 104  100  98      CO2 22 - 32 mmol/L 28  32  27      Calcium 8.9 - 10.3 mg/dL 10.0  9.7  9.6      Total Protein 6.5 - 8.1 g/dL 7.5     Total Bilirubin 0.3 - 1.2 mg/dL 0.9     Alkaline Phos 38 - 126 U/L 49   62      AST 15 - 41 U/L 15   16      ALT 0 - 44 U/L 7   8         This result is from an external source.     Radiologic Studies:  ***  Assessment: No diagnosis found.  ***  Plan:  ***  Thank you for the courtesy of this consult.  Please call me with any questions or concerns.  Nelida Meuse III  CC: Referring provider noted above

## 2022-05-28 DIAGNOSIS — E612 Magnesium deficiency: Principal | ICD-10-CM

## 2022-05-28 DIAGNOSIS — Z944 Liver transplant status: Principal | ICD-10-CM

## 2022-05-28 DIAGNOSIS — Z5181 Encounter for therapeutic drug level monitoring: Principal | ICD-10-CM

## 2022-05-28 NOTE — Unmapped (Signed)
[name] to provide updates on recent events. She reported she had some issue obtaining her tacrolimus, a few weeks ago, but insisted she did not have a lapse in medication. However, the course of conversation left some question again about whether she had been taking it twice daily, since she had told this [facility] at one time that she was taking it all once daily to because she did not want to take it twice in a day. Patient reported she was seen in the ED twice in the last few months for a fall and diarrhea and shared that her largest concern was loss of vision in her left eye and the possibility that she might not be able to drive and remain independent. Patient continued to report interpersonal difficulties with her adult daughter. She reports she is seeing a Veterinary surgeon, so encouraged her to discuss her health and family relationship concerns with them. Reminded her again to obtain labs and placed new standing liver txp lab orders with [facility].

## 2022-06-01 DIAGNOSIS — Z944 Liver transplant status: Principal | ICD-10-CM

## 2022-06-01 DIAGNOSIS — E612 Magnesium deficiency: Principal | ICD-10-CM

## 2022-06-01 DIAGNOSIS — Z5181 Encounter for therapeutic drug level monitoring: Principal | ICD-10-CM

## 2022-06-03 ENCOUNTER — Ambulatory Visit (INDEPENDENT_AMBULATORY_CARE_PROVIDER_SITE_OTHER): Payer: Self-pay | Admitting: Licensed Clinical Social Worker

## 2022-06-03 ENCOUNTER — Telehealth (HOSPITAL_COMMUNITY): Payer: Self-pay | Admitting: Licensed Clinical Social Worker

## 2022-06-03 DIAGNOSIS — Z91199 Patient's noncompliance with other medical treatment and regimen due to unspecified reason: Secondary | ICD-10-CM

## 2022-06-03 NOTE — Progress Notes (Signed)
LCSW counselor attempted to connect with patient for scheduled PHONE appointment via [place] with no response; also attempted to connect via standard phone without success. LCSW counselor left message for patient to call office number to reschedule OPT appointment.   Attempt 1: phone attempt: 1:04p--LMVM  Attempt 2: phone attempt:  1:10 LMVM  Attempt 3: phone call: 1:17p  LMVM--informed pt visit will be a No Show  Per [place], after multiple attempts to reach pt unsuccessfully at appointed time--visit will be coded as no show

## 2022-06-03 NOTE — Telephone Encounter (Signed)
LCSW counselor attempted to connect with patient for scheduled PHONE appointment via [place] with no response; also attempted to connect via standard phone without success. LCSW counselor left message for patient to call office number to reschedule OPT appointment.   Attempt 1: phone attempt: 1:04p--LMVM  Attempt 2: phone attempt:  1:10 LMVM  Attempt 3: phone call: 1:17p  LMVM--informed pt visit will be a No Show  Per [place], after multiple attempts to reach pt unsuccessfully at appointed time--visit will be coded as no show

## 2022-06-08 DIAGNOSIS — Z5181 Encounter for therapeutic drug level monitoring: Principal | ICD-10-CM

## 2022-06-08 DIAGNOSIS — Z944 Liver transplant status: Principal | ICD-10-CM

## 2022-06-08 DIAGNOSIS — E612 Magnesium deficiency: Principal | ICD-10-CM

## 2022-06-11 ENCOUNTER — Ambulatory Visit: Payer: Medicare Other | Admitting: Internal Medicine

## 2022-06-15 DIAGNOSIS — Z5181 Encounter for therapeutic drug level monitoring: Principal | ICD-10-CM

## 2022-06-15 DIAGNOSIS — Z944 Liver transplant status: Principal | ICD-10-CM

## 2022-06-15 DIAGNOSIS — E612 Magnesium deficiency: Principal | ICD-10-CM

## 2022-06-16 NOTE — Unmapped (Signed)
[facility] Refill Coordination Note    Specialty Medication(s) to be Shipped:   Transplant: Prograf 0.5mg     Other medication(s) to be shipped: No additional medications requested for fill at this time     Kristy Hanson, DOB: [birth year]  Phone: [phone] (home)       All above HIPAA information was verified with patient.     Was a Nurse, learning disability used for this call? No    Completed refill call assessment today to schedule patient's medication shipment from the [facility] [phone]).  All relevant notes have been reviewed.     Specialty medication(s) and dose(s) confirmed: Regimen is correct and unchanged.   Changes to medications: [name] reports no changes at this time.  Changes to insurance: No  New side effects reported not previously addressed with a pharmacist or physician: None reported  Questions for the pharmacist: No    Confirmed patient received a Conservation officer, historic buildings and a Surveyor, mining with first shipment. The patient will receive a drug information handout for each medication shipped and additional FDA Medication Guides as required.       DISEASE/MEDICATION-SPECIFIC INFORMATION        N/A    SPECIALTY MEDICATION ADHERENCE     Medication Adherence    Patient reported X missed doses in the last month: 0  Specialty Medication: PROGRAF 0.5 MG capsule (tacrolimus)  Patient is on additional specialty medications: No                                Were doses missed due to medication being on hold? No    Prograf 0.5 mg: 10 days of medicine on hand       REFERRAL TO PHARMACIST     Referral to the pharmacist: Not needed      [facility]     Shipping address confirmed in Epic.     Delivery Scheduled: Yes, Expected medication delivery date: 06/19/22.     Medication will be delivered via UPS to the prescription address in Epic WAM.    Quintella Reichert   [facility] Specialty Technician

## 2022-06-18 MED FILL — PROGRAF 0.5 MG CAPSULE: ORAL | 30 days supply | Qty: 180 | Fill #5

## 2022-06-22 ENCOUNTER — Encounter (HOSPITAL_COMMUNITY): Payer: Self-pay | Admitting: Student in an Organized Health Care Education/Training Program

## 2022-06-22 ENCOUNTER — Ambulatory Visit (HOSPITAL_BASED_OUTPATIENT_CLINIC_OR_DEPARTMENT_OTHER): Payer: Medicare Other | Admitting: Student in an Organized Health Care Education/Training Program

## 2022-06-22 VITALS — BP 95/71 | HR 99 | Wt 122.0 lb

## 2022-06-22 DIAGNOSIS — Z944 Liver transplant status: Principal | ICD-10-CM

## 2022-06-22 DIAGNOSIS — E612 Magnesium deficiency: Principal | ICD-10-CM

## 2022-06-22 DIAGNOSIS — Z5181 Encounter for therapeutic drug level monitoring: Principal | ICD-10-CM

## 2022-06-22 DIAGNOSIS — F411 Generalized anxiety disorder: Secondary | ICD-10-CM

## 2022-06-22 MED ORDER — GABAPENTIN 300 MG PO CAPS: 300.0000 mg | ORAL_CAPSULE | Freq: Every day | ORAL | 2 refills | Status: DC
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
## 2022-06-24 NOTE — Addendum Note (Signed)
Addended by: Charlette Caffey on: 06/24/2022 01:40 PM   Modules accepted: Level of Service

## 2022-06-24 NOTE — Unmapped (Addendum)
Received notification from [facility] pharmacy technician that patient was trying to reach out to [facility] with a question. Attempted to call patient, but there was no answer or VM setup. Patient returned call to [facility] and inquired about the safety of taking gabapentin with prograf. Reassured her that gabapentin would be fine for her to use from a liver txp perspective.Encouraged her to complete lab work soon, since no tac level has been tested since last July. She reported she was off on her tac dosing this week, sleeping through her 2nd dose yesterday, so she would prefer to get labs when she is taking her meds twice daily, which she is aware she should do daily. She agreed to repeat labs soon.

## 2022-06-25 ENCOUNTER — Telehealth: Payer: Self-pay | Admitting: Anesthesiology

## 2022-06-25 DIAGNOSIS — E612 Magnesium deficiency: Secondary | ICD-10-CM | POA: Diagnosis not present

## 2022-06-25 DIAGNOSIS — Z5181 Encounter for therapeutic drug level monitoring: Secondary | ICD-10-CM | POA: Diagnosis not present

## 2022-06-25 DIAGNOSIS — Z944 Liver transplant status: Secondary | ICD-10-CM | POA: Diagnosis not present

## 2022-06-25 NOTE — Telephone Encounter (Signed)
Pt's daughter April called stating she has questions regarding a medication her mother is taking.

## 2022-06-26 LAB — CBC W/ DIFFERENTIAL
BANDED NEUTROPHILS ABSOLUTE COUNT: 0 10*3/uL (ref 0.0–0.1)
BASOPHILS ABSOLUTE COUNT: 0.1 10*3/uL (ref 0.0–0.2)
BASOPHILS RELATIVE PERCENT: 1 %
EOSINOPHILS ABSOLUTE COUNT: 0.2 10*3/uL (ref 0.0–0.4)
EOSINOPHILS RELATIVE PERCENT: 2 %
HEMATOCRIT: 37.1 % (ref 34.0–46.6)
HEMOGLOBIN: 12.9 g/dL (ref 11.1–15.9)
IMMATURE GRANULOCYTES: 0 %
LYMPHOCYTES ABSOLUTE COUNT: 2.1 10*3/uL (ref 0.7–3.1)
LYMPHOCYTES RELATIVE PERCENT: 28 %
MEAN CORPUSCULAR HEMOGLOBIN CONC: 34.8 g/dL (ref 31.5–35.7)
MEAN CORPUSCULAR HEMOGLOBIN: 32.3 pg (ref 26.6–33.0)
MEAN CORPUSCULAR VOLUME: 93 fL (ref 79–97)
MONOCYTES ABSOLUTE COUNT: 0.5 10*3/uL (ref 0.1–0.9)
MONOCYTES RELATIVE PERCENT: 7 %
NEUTROPHILS ABSOLUTE COUNT: 4.7 10*3/uL (ref 1.4–7.0)
NEUTROPHILS RELATIVE PERCENT: 62 %
PLATELET COUNT: 209 10*3/uL (ref 150–450)
RED BLOOD CELL COUNT: 4 x10E6/uL (ref 3.77–5.28)
RED CELL DISTRIBUTION WIDTH: 12.9 % (ref 11.7–15.4)
WHITE BLOOD CELL COUNT: 7.6 10*3/uL (ref 3.4–10.8)

## 2022-06-26 LAB — COMPREHENSIVE METABOLIC PANEL
A/G RATIO: 2.4 — ABNORMAL HIGH (ref 1.2–2.2)
ALBUMIN: 4.7 g/dL (ref 3.8–4.8)
ALKALINE PHOSPHATASE: 65 IU/L (ref 44–121)
ALT (SGPT): 8 IU/L (ref 0–32)
AST (SGOT): 17 IU/L (ref 0–40)
BILIRUBIN TOTAL (MG/DL) IN SER/PLAS: 0.3 mg/dL (ref 0.0–1.2)
BLOOD UREA NITROGEN: 26 mg/dL (ref 8–27)
BUN / CREAT RATIO: 20 (ref 12–28)
CALCIUM: 9.6 mg/dL (ref 8.7–10.3)
CHLORIDE: 101 mmol/L (ref 96–106)
CO2: 24 mmol/L (ref 20–29)
CREATININE: 1.28 mg/dL — ABNORMAL HIGH (ref 0.57–1.00)
GLOBULIN, TOTAL: 2 g/dL (ref 1.5–4.5)
GLUCOSE: 99 mg/dL (ref 70–99)
POTASSIUM: 3.7 mmol/L (ref 3.5–5.2)
SODIUM: 142 mmol/L (ref 134–144)
TOTAL PROTEIN: 6.7 g/dL (ref 6.0–8.5)

## 2022-06-26 LAB — MAGNESIUM: MAGNESIUM: 1.7 mg/dL (ref 1.6–2.3)

## 2022-06-26 LAB — PHOSPHORUS: PHOSPHORUS, SERUM: 3.9 mg/dL (ref 3.0–4.3)

## 2022-06-26 LAB — GAMMA GT: GAMMA GLUTAMYL TRANSFERASE: 17 IU/L (ref 0–60)

## 2022-06-26 LAB — BILIRUBIN, DIRECT: BILIRUBIN DIRECT: 0.11 mg/dL (ref 0.00–0.40)

## 2022-06-26 NOTE — Telephone Encounter (Signed)
Spoke with pt daughter she is going to talk to prescribing Dr and let them know what is gong on with her mother to she about a lower dose of gabapentin or a different medication all together,

## 2022-06-26 NOTE — Telephone Encounter (Signed)
Pt daughter is worried because the psychiatrist has put her mom on 300 mg of gabapentin at night and it has made her increased her confused an increased her dizziness they are asking what should they do? They do not want her to fall an get hurt.

## 2022-06-28 LAB — TACROLIMUS LEVEL: TACROLIMUS BLOOD: 5.2 ng/mL (ref 2.0–20.0)

## 2022-06-29 DIAGNOSIS — Z5181 Encounter for therapeutic drug level monitoring: Principal | ICD-10-CM

## 2022-06-29 DIAGNOSIS — E612 Magnesium deficiency: Principal | ICD-10-CM

## 2022-06-29 DIAGNOSIS — Z944 Liver transplant status: Principal | ICD-10-CM

## 2022-06-30 ENCOUNTER — Telehealth (HOSPITAL_COMMUNITY): Payer: Self-pay | Admitting: *Deleted

## 2022-06-30 NOTE — Telephone Encounter (Signed)
Pt's daughter called wanting to know why pt was put on Neurontin 300 mg Qhs which is causing "dizziness and feeling and acting like a zombie" as she says pt is a high fall risk and also has cognitive impairments. Also wants to know why antidepressant, Cymbalta, wasn't changed, saying that pt has been on med for 3 years with minimal improvement. Pt daughter also stated that pt was referred to [facility] to address these issues and that pt's Neurologist is concerned as well. Daughter would like a call from provider. Pt next appointment is scheduled for 07/27/22. Please review.

## 2022-06-30 NOTE — Telephone Encounter (Signed)
Called and spoke with daughter, patient did not pick up. Because of side effects did communicate to daughter that patient should stop medication (gabapentin) as soon as possible. Recommended she come for next appt with patient, due to lack of ROI and patient urgent situation. Daughter was very pleasant and onboard with this plan.

## 2022-07-01 NOTE — Telephone Encounter (Signed)
Thank you so much.

## 2022-07-06 ENCOUNTER — Encounter: Payer: Self-pay | Admitting: Gastroenterology

## 2022-07-06 ENCOUNTER — Ambulatory Visit (INDEPENDENT_AMBULATORY_CARE_PROVIDER_SITE_OTHER): Payer: Medicare Other | Admitting: Gastroenterology

## 2022-07-06 VITALS — BP 110/64 | HR 87 | Ht 63.0 in | Wt 123.0 lb

## 2022-07-06 DIAGNOSIS — R634 Abnormal weight loss: Secondary | ICD-10-CM | POA: Diagnosis not present

## 2022-07-06 DIAGNOSIS — Z944 Liver transplant status: Secondary | ICD-10-CM

## 2022-07-06 DIAGNOSIS — E612 Magnesium deficiency: Principal | ICD-10-CM

## 2022-07-06 DIAGNOSIS — D849 Immunodeficiency, unspecified: Principal | ICD-10-CM

## 2022-07-06 DIAGNOSIS — R197 Diarrhea, unspecified: Principal | ICD-10-CM

## 2022-07-06 DIAGNOSIS — Z5181 Encounter for therapeutic drug level monitoring: Principal | ICD-10-CM

## 2022-07-06 MED ORDER — PROGRAF 0.5 MG CAPSULE
ORAL_CAPSULE | Freq: Two times a day (BID) | ORAL | 5 refills | 30 days
Start: 2022-07-06 — End: ?
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
## 2022-07-06 NOTE — Patient Instructions (Signed)
_______________________________________________________  If your blood pressure at your visit was 140/90 or greater, please contact your primary care physician to follow up on this.  _______________________________________________________  If you are age 75 or older, your body mass index should be between 23-30. Your Body mass index is 21.79 kg/m. If this is out of the aforementioned range listed, please consider follow up with your Primary Care Provider.  If you are age 64 or younger, your body mass index should be between 19-25. Your Body mass index is 21.79 kg/m. If this is out of the aformentioned range listed, please consider follow up with your Primary Care Provider.   ________________________________________________________  The [facility] providers would like to encourage you to use [facility] to communicate with providers for non-urgent requests or questions.  Due to long hold times on the telephone, sending your provider a message by [facility] may be a faster and more efficient way to get a response.  Please allow 48 business hours for a response.  Please remember that this is for non-urgent requests.  _______________________________________________________  It has been recommended to you by your physician that you have an EGD completed. Per your request, we did not schedule the procedure today. Please contact our office at [phone] should you decide to have the procedure completed. You will be scheduled for a pre-visit and procedure at that time.  It was a pleasure to see you today!  Thank you for trusting me with your gastrointestinal care!

## 2022-07-06 NOTE — Unmapped (Signed)
[facility] Refill Coordination Note    Specialty Medication(s) to be Shipped:   Transplant: Prograf 0.5mg     Other medication(s) to be shipped: No additional medications requested for fill at this time     Kristy Hanson, DOB: 1945-07-19  Phone: [phone] (home)       All above HIPAA information was verified with patient.     Was a Nurse, learning disability used for this call? No    Completed refill call assessment today to schedule patient's medication shipment from the [facility] [phone]).  All relevant notes have been reviewed.     Specialty medication(s) and dose(s) confirmed: Regimen is correct and unchanged.   Changes to medications: Kristy Hanson reports no changes at this time.  Changes to insurance: No  New side effects reported not previously addressed with a pharmacist or physician: None reported  Questions for the pharmacist: No    Confirmed patient received a Conservation officer, historic buildings and a Surveyor, mining with first shipment. The patient will receive a drug information handout for each medication shipped and additional FDA Medication Guides as required.       DISEASE/MEDICATION-SPECIFIC INFORMATION        N/A    SPECIALTY MEDICATION ADHERENCE     Medication Adherence    Patient reported X missed doses in the last month: 1  Specialty Medication: Prograf 0.5mg   Patient is on additional specialty medications: No                                Were doses missed due to medication being on hold? No    Prograf 0.5 mg: 10 days of medicine on hand     REFERRAL TO PHARMACIST     Referral to the pharmacist: Not needed      [facility]     Shipping address confirmed in Epic.     Delivery Scheduled: Yes, Expected medication delivery date: 07/14/22.     Medication will be delivered via UPS to the prescription address in Epic WAM.    Kristy Hanson, [facility]   [facility] Specialty Pharmacist

## 2022-07-07 DIAGNOSIS — Z944 Liver transplant status: Principal | ICD-10-CM

## 2022-07-07 DIAGNOSIS — D849 Immunodeficiency, unspecified: Principal | ICD-10-CM

## 2022-07-07 DIAGNOSIS — R197 Diarrhea, unspecified: Principal | ICD-10-CM

## 2022-07-07 MED ORDER — PROGRAF 0.5 MG CAPSULE
ORAL_CAPSULE | Freq: Two times a day (BID) | ORAL | 5 refills | 30 days | Status: CP
Start: 2022-07-07 — End: ?
  Filled 2022-07-13: qty 180, 30d supply, fill #0

## 2022-07-13 DIAGNOSIS — E612 Magnesium deficiency: Principal | ICD-10-CM

## 2022-07-13 DIAGNOSIS — Z5181 Encounter for therapeutic drug level monitoring: Principal | ICD-10-CM

## 2022-07-13 DIAGNOSIS — Z944 Liver transplant status: Principal | ICD-10-CM

## 2022-07-15 ENCOUNTER — Ambulatory Visit: Payer: Medicare Other | Admitting: Internal Medicine

## 2022-07-15 ENCOUNTER — Ambulatory Visit (INDEPENDENT_AMBULATORY_CARE_PROVIDER_SITE_OTHER): Payer: Medicare Other | Admitting: Licensed Clinical Social Worker

## 2022-07-15 DIAGNOSIS — F411 Generalized anxiety disorder: Secondary | ICD-10-CM | POA: Diagnosis not present

## 2022-07-16 NOTE — Progress Notes (Signed)
11

## 2022-07-17 ENCOUNTER — Encounter (HOSPITAL_COMMUNITY): Payer: Self-pay
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
## 2022-07-20 ENCOUNTER — Encounter: Payer: Self-pay | Admitting: Gastroenterology

## 2022-07-20 DIAGNOSIS — E612 Magnesium deficiency: Principal | ICD-10-CM

## 2022-07-20 DIAGNOSIS — Z5181 Encounter for therapeutic drug level monitoring: Principal | ICD-10-CM

## 2022-07-20 DIAGNOSIS — Z944 Liver transplant status: Principal | ICD-10-CM

## 2022-07-27 ENCOUNTER — Encounter (HOSPITAL_COMMUNITY): Payer: Self-pay | Admitting: Student in an Organized Health Care Education/Training Program

## 2022-07-27 ENCOUNTER — Ambulatory Visit (HOSPITAL_BASED_OUTPATIENT_CLINIC_OR_DEPARTMENT_OTHER): Payer: Medicare Other | Admitting: Student in an Organized Health Care Education/Training Program

## 2022-07-27 ENCOUNTER — Ambulatory Visit (HOSPITAL_COMMUNITY): Payer: Medicare Other | Admitting: Licensed Clinical Social Worker

## 2022-07-27 DIAGNOSIS — F331 Major depressive disorder, recurrent, moderate: Secondary | ICD-10-CM

## 2022-07-27 DIAGNOSIS — Z5181 Encounter for therapeutic drug level monitoring: Principal | ICD-10-CM

## 2022-07-27 DIAGNOSIS — Z944 Liver transplant status: Principal | ICD-10-CM

## 2022-07-27 DIAGNOSIS — E612 Magnesium deficiency: Principal | ICD-10-CM

## 2022-07-27 MED ORDER — SERTRALINE HCL 50 MG PO TABS: 50.0000 mg | ORAL_TABLET | Freq: Every day | ORAL | 1 refills | Status: DC

## 2022-07-27 MED ORDER — SERTRALINE HCL 25 MG PO TABS: 25.0000 mg | ORAL_TABLET | Freq: Every day | ORAL | 0 refills | Status: DC

## 2022-07-27 MED ORDER — DULOXETINE HCL 30 MG PO CPEP: 30.0000 mg | ORAL_CAPSULE | Freq: Every day | ORAL | 0 refills | Status: DC

## 2022-07-27 NOTE — Patient Instructions (Addendum)
Medication changes  Decrease Duloxetine to 104m for 7 days the stop Then, start Zoloft (Sertraline) at 275mfor 7 days Then increase Zoloft to 5058mntil seen again
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
## 2022-08-03 DIAGNOSIS — E612 Magnesium deficiency: Principal | ICD-10-CM

## 2022-08-03 DIAGNOSIS — Z944 Liver transplant status: Principal | ICD-10-CM

## 2022-08-03 DIAGNOSIS — Z5181 Encounter for therapeutic drug level monitoring: Principal | ICD-10-CM

## 2022-08-07 DIAGNOSIS — R310 Gross hematuria: Secondary | ICD-10-CM | POA: Diagnosis not present

## 2022-08-07 NOTE — Unmapped (Signed)
[facility] Clinical Assessment & Refill Coordination Note    Kristy Hanson, DOB: 05/22/1946  Phone: [phone] (home)     All above HIPAA information was verified with patient.     Was a Nurse, learning disability used for this call? No    Specialty Medication(s):   Transplant: Prograf 0.5mg      Current Outpatient Medications   Medication Sig Dispense Refill    ALPHAGAN P 0.1 % Drop Administer 1 drop to both eyes Two (2) times a day.      aspirin (ECOTRIN) 81 MG tablet Take 81 mg by mouth daily.      calcium carbonate 300 mg (750 mg) Chew Chew.      cholecalciferol, vitamin D3, (VITAMIN D3) 1,000 unit capsule Take 1,000 Units by mouth daily.      DULoxetine (CYMBALTA) 60 MG capsule Take 60 mg by mouth daily.      ferrous sulfate 325 (65 FE) MG tablet Take 325 mg by mouth daily with breakfast.      folic acid (FOLVITE) 1 MG tablet Take 400 mcg by mouth daily.       ledipasvir 90 mg-sofosbuvir 400 mg (HARVONI) tablet Take 1 tablet by mouth daily. (Patient not taking: Reported on 03/30/2017) 28 tablet 2    potassium citrate (UROCIT-K 15) 15 mEq TbER Take 2 tablets by mouth Two (2) times a day.       PROGRAF 0.5 mg capsule Take 3 capsules (1.5 mg total) by mouth two (2) times a day. 180 capsule 5    propranolol (INNOPRAN XL) 80 MG 24 hr capsule Take 80 mg by mouth nightly.      pyridoxine (B-6) 100 MG tablet Take 100 mg by mouth daily.      rizatriptan (MAXALT-MLT) 5 MG disintegrating tablet Take 5 mg by mouth once as needed for migraine. May repeat in 2 hours if needed      traMADol (ULTRAM) 50 mg tablet Take 50 mg by mouth every six (6) hours as needed for pain.       No current facility-administered medications for this visit.        Changes to medications:  patient states she is working on memory and depression issues with her PCP, she is unsure names but will check and let clinic know, I will message clinic as well so they can followup if needed    Allergies   Allergen Reactions    Avastin [Bevacizumab] Other (See Comments)     Mini stroke    Prednisone Other (See Comments)     Patient was told by her ophthalmologist that the vision loss she had in one of her eyes was likely a result of high dose steroids. She also reports insomnia/jitters when taking it.    Amoxicillin-Pot Clavulanate      Other reaction(s): NAUSEA    Codeine      Other reaction(s): NAUSEA    Penicillins      Other reaction(s): UNKNOWN  * Pt would like to know why she shouldn't take this*    Procaine      Other reaction(s): dizziness    Promethazine      oversedation    Sulfa (Sulfonamide Antibiotics)      Other reaction(s): ITCHING    Lidocaine Palpitations    Tamsulosin Itching       Changes to allergies: No    SPECIALTY MEDICATION ADHERENCE     Prograf 0.5mg   : 10 days of medicine on hand  Medication Adherence    Patient reported X missed doses in the last month: 0  Specialty Medication: prograf 0.5mg   Patient is on additional specialty medications: No          Specialty medication(s) dose(s) confirmed: Regimen is correct and unchanged.     Are there any concerns with adherence? No    Adherence counseling provided? Not needed    CLINICAL MANAGEMENT AND INTERVENTION      Clinical Benefit Assessment:    Do you feel the medicine is effective or helping your condition? Yes    Clinical Benefit counseling provided? Not needed    Adverse Effects Assessment:    Are you experiencing any side effects? No    Are you experiencing difficulty administering your medicine? No    Quality of Life Assessment:    Quality of Life    Rheumatology  Oncology  Dermatology  Cystic Fibrosis          How many days over the past month did your transplant  keep you from your normal activities? For example, brushing your teeth or getting up in the morning. 0    Have you discussed this with your provider? Not needed    Acute Infection Status:    Acute infections noted within Epic:  Rule Out C. Diff  Patient reported infection: None    Therapy Appropriateness:    Is therapy appropriate and patient progressing towards therapeutic goals? Yes, therapy is appropriate and should be continued    DISEASE/MEDICATION-SPECIFIC INFORMATION      N/A    Solid Organ Transplant: Not Applicable    PATIENT SPECIFIC NEEDS     Does the patient have any physical, cognitive, or cultural barriers? No    Is the patient high risk? No    Did the patient require a clinical intervention? No    Does the patient require physician intervention or other additional services (i.e., nutrition, smoking cessation, social work)? No    SOCIAL DETERMINANTS OF HEALTH     At the [facility], we have learned that life circumstances - like trouble affording food, housing, utilities, or transportation can affect the health of many of our patients.   That is why we wanted to ask: are you currently experiencing any life circumstances that are negatively impacting your health and/or quality of life? No    Social Determinants of Health     Financial Resource Strain: Low Risk  (03/10/2021)    Received from [facility]    Overall Financial Resource Strain (CARDIA)     Difficulty of Paying Living Expenses: Not very hard   Internet Connectivity: Not on file   Food Insecurity: No Food Insecurity (03/10/2021)    Received from [facility]    Hunger Vital Sign     Worried About Running Out of Food in the Last Year: Never true     Ran Out of Food in the Last Year: Never true   Tobacco Use: Low Risk  (06/22/2022)    Received from [facility]    Patient History     Smoking Tobacco Use: Never     Smokeless Tobacco Use: Never     Passive Exposure: Not on file   Housing/Utilities: Not on file   Alcohol Use: Not on file   Transportation Needs: No Transportation Needs (03/10/2021)    Received from [facility] - Transportation     Lack of Transportation (Medical): No     Lack of Transportation (Non-Medical):  No   Substance Use: Not on file   Health Literacy: Not on file   Physical Activity: Insufficiently Active (03/10/2021)    Received from [facility]    Exercise Vital Sign     Days of Exercise per Week: 1 day     Minutes of Exercise per Session: 10 min   Interpersonal Safety: Not on file   Stress: Stress Concern Present (03/10/2021)    Received from [facility] - Occupational Stress Questionnaire     Feeling of Stress : Very much   Intimate Partner Violence: Not At Risk (03/10/2021)    Received from [facility]    Humiliation, Afraid, Rape, and Kick questionnaire     Fear of Current or Ex-Partner: No     Emotionally Abused: No     Physically Abused: No     Sexually Abused: No   Depression: Not at risk (07/26/2018)    Received from [facility]    PHQ-2     PHQ-2 Score: 0   Social Connections: Socially Isolated (03/10/2021)    Received from [facility]    Social Connection and Isolation Panel [NHANES]     Frequency of Communication with Friends and Family: Never     Frequency of Social Gatherings with Friends and Family: Never     Attends Religious Services: Never     Database administrator or Organizations: No     Attends Engineer, structural: Never     Marital Status: Divorced       Would you be willing to receive help with any of the needs that you have identified today? Not applicable       SHIPPING     Specialty Medication(s) to be Shipped:   Transplant: Prograf 0.5mg     Other medication(s) to be shipped: No additional medications requested for fill at this time     Changes to insurance: No    Patient was informed of new phone menu: Yes    Delivery Scheduled: Yes, Expected medication delivery date: 08/13/2022.     Medication will be delivered via UPS to the confirmed prescription address in [facility].    The patient will receive a drug information handout for each medication shipped and additional FDA Medication Guides as required.  Verified that patient has previously received a Conservation officer, historic buildings and a Surveyor, mining.    The patient or caregiver noted above participated in the development of this care plan and knows that they can request review of or adjustments to the care plan at any time.      All of the patient's questions and concerns have been addressed.    Thad Ranger, PharmD   [facility] Specialty Pharmacist

## 2022-08-10 DIAGNOSIS — E612 Magnesium deficiency: Principal | ICD-10-CM

## 2022-08-10 DIAGNOSIS — Z5181 Encounter for therapeutic drug level monitoring: Principal | ICD-10-CM

## 2022-08-10 DIAGNOSIS — Z944 Liver transplant status: Principal | ICD-10-CM

## 2022-08-12 ENCOUNTER — Ambulatory Visit (INDEPENDENT_AMBULATORY_CARE_PROVIDER_SITE_OTHER): Payer: Medicare Other | Admitting: Licensed Clinical Social Worker

## 2022-08-12 DIAGNOSIS — F411 Generalized anxiety disorder: Secondary | ICD-10-CM

## 2022-08-12 MED FILL — PROGRAF 0.5 MG CAPSULE: ORAL | 30 days supply | Qty: 180 | Fill #1

## 2022-08-12 NOTE — Progress Notes (Incomplete)
   THERAPIST PROGRESS NOTE  Session Time: 3-4p  [place] in office visit for patient and LCSW clinician  Participation Level: Active  Behavioral Response: Neat and Well GroomedAlert and ConfusedAnxious and Depressed  Type of Therapy: Individual Therapy  Treatment Goals addressed: Learn and implement coping skills that result in a reduction of anxiety and worry, and improve daily functioning per pt report 3 out of 5 sessions documented   Reduce overall frequency, intensity, and duration of the anxiety so that daily functioning is not impaired per pt self report 3 out of 5 sessions documented   ProgressTowards Goals: Progressing  Interventions: Solution Focused and Supportive  Summary: Candace Hill is a 77 y.o. female who presents with symptoms consistent with anxiety disorder.  Patient reports that she is compliant with her Zoloft, and is compliant with her psychiatric appointments.  Patient reports good quality and quantity of sleep.  Patient presents as very scattered, anxious, and frequently off topic throughout session.  Patient was redirected multiple times.  Allowed pt to explore and express thoughts and feelings associated with recent life situations and external stressors.  Patient reports that her daughter is a good resource to her and is also identified as a stressor for her.  Patient reports that she lives in an apartment and is surrounded by all of the " things" that she has collected throughout the years.  Patient states that her daughter wants to get rid of a lot of her things, and this is triggering a lot of anxiety with patient.  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  Patient states that she feels that she is at the end of her life and wants to be surrounded by her precious objects.  Patient made the statement that her daughter has OCD and is trying to project her  anxieties on to patient..  Allowed patient to identify other external stressors, and also to identify current coping skills that she is using to manage her feelings.  Suicidal/Homicidal: No  Therapist Response: Continued recommendations are as follows: self care behaviors, positive social engagements, focusing on overall work/home/life balance, and focusing on positive physical and emotional wellness.   Clinician listened reflectively and provided constructive feedback and support focusing on continued management of anxiety, grief, and stress.  Plan: Return again in 4 weeks.  Diagnosis:  Encounter Diagnosis  Name Primary?   GAD (generalized anxiety disorder) Yes    Collaboration of Care: Other Pt encouraged to continue care with psychiatrist of record, Dr. Damita Dunnings  Patient/Guardian was advised Release of Information must be obtained prior to any record release in order to collaborate their care with an outside provider. Patient/Guardian was advised if they have not already done so to contact the registration department to sign all necessary forms in order for [place] to release information regarding their care.   Consent: Patient/Guardian gives verbal consent for treatment and assignment of benefits for services provided during this visit. Patient/Guardian expressed understanding and agreed to proceed.   [place], LCSW 08/12/2022

## 2022-08-17 DIAGNOSIS — N281 Cyst of kidney, acquired: Secondary | ICD-10-CM | POA: Diagnosis not present

## 2022-08-17 DIAGNOSIS — N200 Calculus of kidney: Secondary | ICD-10-CM | POA: Diagnosis not present

## 2022-08-17 DIAGNOSIS — R310 Gross hematuria: Secondary | ICD-10-CM | POA: Diagnosis not present

## 2022-08-17 DIAGNOSIS — E612 Magnesium deficiency: Principal | ICD-10-CM

## 2022-08-17 DIAGNOSIS — Z944 Liver transplant status: Principal | ICD-10-CM

## 2022-08-17 DIAGNOSIS — Z5181 Encounter for therapeutic drug level monitoring: Principal | ICD-10-CM

## 2022-08-18 DIAGNOSIS — R310 Gross hematuria: Secondary | ICD-10-CM | POA: Diagnosis not present

## 2022-08-24 ENCOUNTER — Ambulatory Visit (HOSPITAL_COMMUNITY): Payer: Medicare Other | Admitting: Licensed Clinical Social Worker

## 2022-08-24 DIAGNOSIS — E612 Magnesium deficiency: Principal | ICD-10-CM

## 2022-08-24 DIAGNOSIS — Z5181 Encounter for therapeutic drug level monitoring: Principal | ICD-10-CM

## 2022-08-24 DIAGNOSIS — Z944 Liver transplant status: Principal | ICD-10-CM

## 2022-08-27 ENCOUNTER — Ambulatory Visit (INDEPENDENT_AMBULATORY_CARE_PROVIDER_SITE_OTHER): Payer: Medicare Other | Admitting: Licensed Clinical Social Worker

## 2022-08-27 DIAGNOSIS — F411 Generalized anxiety disorder: Secondary | ICD-10-CM | POA: Diagnosis not present

## 2022-08-27 NOTE — Progress Notes (Signed)
THERAPIST PROGRESS NOTE  Session Time: 2-3p  [place] in office visit for patient and LCSW clinician  Participation Level: Active  Behavioral Response: Neat and Well GroomedAlert and ConfusedAnxious and Depressed  Type of Therapy: Individual Therapy  Treatment Goals addressed: Learn and implement coping skills that result in a reduction of anxiety and worry, and improve daily functioning per pt report 3 out of 5 sessions documented   Reduce overall frequency, intensity, and duration of the anxiety so that daily functioning is not impaired per pt self report 3 out of 5 sessions documented   ProgressTowards Goals: Progressing  Interventions: Solution Focused and Supportive  Summary: Wynell Brigance is a 77 y.o. female who presents with symptoms consistent with anxiety disorder.  Patient reports that she is compliant with her Zoloft, and is compliant with her psychiatric appointments.  Patient reports good quality and quantity of sleep.  Patient stayed on topic, and seemed to have a more clear thought process throughout today's session.  Patient reports that she is continuing to organize her house, and has broken up bigger projects into small more manageable projects.  Praised patient's progress and encouraged her to continue with this strategy throughout her home.  Patient reports that she is not hoarding items, but she is taking some more sentimental things that she is fearful that her daughter will throw out and is putting them in other areas of her home or in the storage building in the back of her house.  Discussed overall health related concerns, and patient reports that transportation is continuing to be an issue for her-patient stated that her daughter made the statement that she would take her anywhere she needs to go, but patient is fearful that she is a burden on her daughter, and wants to have her own resources.  Patient states that her daughter has  made the comment of trying to find a full-time job, so patient is aware that her daughter will not be available to her if she does work full-time.  Patient states that she is currently searching for a church home, and feels that this would be a good option for her to engage socially with others.  Provided patient with support regarding expanding her social circle and expanding/focusing on her spiritual wellness.  Continued recommendations are as follows: self care behaviors, positive social engagements, focusing on overall work/home/life balance, and focusing on positive physical and emotional wellness.   Suicidal/Homicidal: No  Therapist Response: Pt is continuing to apply interventions learned in session into daily life situations. Pt is currently on track to meet goals utilizing interventions mentioned above. Personal growth and progress noted. Treatment to continue as indicated.   Patient is demonstrating continued development in overall self-awareness, and improvements and life management behaviors.  Plan: Return again in 4 weeks.  Diagnosis:  Encounter Diagnosis  Name Primary?   GAD (generalized anxiety disorder) Yes    Collaboration of Care: Other Pt encouraged to continue care with psychiatrist of record, Dr. Damita Dunnings  Patient/Guardian was advised Release of Information must be obtained prior to any record release in order to collaborate their care with an outside provider. Patient/Guardian was advised if they have not already done so to contact the registration department to sign all necessary forms in order for [place] to release information regarding their care.   Consent: Patient/Guardian gives verbal consent for treatment and assignment of benefits for services provided during this visit. Patient/Guardian expressed understanding and agreed to proceed.   Rachel Bo  Teryl Mcconaghy, LCSW 08/27/2022

## 2022-08-31 DIAGNOSIS — E612 Magnesium deficiency: Principal | ICD-10-CM

## 2022-08-31 DIAGNOSIS — Z5181 Encounter for therapeutic drug level monitoring: Principal | ICD-10-CM

## 2022-08-31 DIAGNOSIS — Z944 Liver transplant status: Principal | ICD-10-CM

## 2022-08-31 NOTE — Unmapped (Signed)
[facility] Refill Coordination Note    Specialty Medication(s) to be Shipped:   Transplant: Prograf 0.5mg     Other medication(s) to be shipped: No additional medications requested for fill at this time     Kristy Hanson, DOB: 04/20/1946  Phone: [phone] (home)       All above HIPAA information was verified with patient.     Was a Nurse, learning disability used for this call? No    Completed refill call assessment today to schedule patient's medication shipment from the [facility] [phone]).  All relevant notes have been reviewed.     Specialty medication(s) and dose(s) confirmed: Regimen is correct and unchanged.   Changes to medications: Kristy Hanson reports no changes at this time.  Changes to insurance: No  New side effects reported not previously addressed with a pharmacist or physician: None reported  Questions for the pharmacist: No    Confirmed patient received a Conservation officer, historic buildings and a Surveyor, mining with first shipment. The patient will receive a drug information handout for each medication shipped and additional FDA Medication Guides as required.       DISEASE/MEDICATION-SPECIFIC INFORMATION        N/A    SPECIALTY MEDICATION ADHERENCE     Medication Adherence    Patient reported X missed doses in the last month: 0  Specialty Medication: PROGRAF 0.5 mg capsule (tacrolimus)  Patient is on additional specialty medications: No  Patient is on more than two specialty medications: No              Were doses missed due to medication being on hold? No    Prograf 0.5 mg: 12 days of medicine on hand       REFERRAL TO PHARMACIST     Referral to the pharmacist: Not needed      [facility]     Shipping address confirmed in Epic.     Delivery Scheduled: Yes, Expected medication delivery date: 09/11/22.     Medication will be delivered via UPS to the prescription address in Epic WAM.    Kristy Hanson   [facility] Shared [facility] Specialty Technician

## 2022-09-02 ENCOUNTER — Encounter: Payer: Medicare Other | Admitting: Gastroenterology

## 2022-09-07 DIAGNOSIS — Z944 Liver transplant status: Principal | ICD-10-CM

## 2022-09-07 DIAGNOSIS — E612 Magnesium deficiency: Principal | ICD-10-CM

## 2022-09-07 DIAGNOSIS — Z5181 Encounter for therapeutic drug level monitoring: Principal | ICD-10-CM

## 2022-09-09 ENCOUNTER — Ambulatory Visit (INDEPENDENT_AMBULATORY_CARE_PROVIDER_SITE_OTHER): Payer: Medicare Other | Admitting: Licensed Clinical Social Worker

## 2022-09-09 DIAGNOSIS — F411 Generalized anxiety disorder: Secondary | ICD-10-CM | POA: Diagnosis not present

## 2022-09-09 DIAGNOSIS — F331 Major depressive disorder, recurrent, moderate: Secondary | ICD-10-CM

## 2022-09-09 NOTE — Progress Notes (Signed)
THERAPIST PROGRESS NOTE  Session Time: 10-11a  [place] in office visit for patient and LCSW clinician  Participation Level: Active  Behavioral Response: Neat and Well GroomedAlert and ConfusedAnxious and Depressed  Type of Therapy: Individual Therapy  Treatment Goals addressed: Learn and implement coping skills that result in a reduction of anxiety and worry, and improve daily functioning per pt report 3 out of 5 sessions documented   Reduce overall frequency, intensity, and duration of the anxiety so that daily functioning is not impaired per pt self report 3 out of 5 sessions documented   ProgressTowards Goals: Progressing  Interventions: Solution Focused and Supportive  Summary: Candace Hill is a 77 y.o. female who presents with symptoms consistent with anxiety disorder.  Patient reports that she is compliant with her Zoloft, and is compliant with her psychiatric appointments.  Patient reports good quality and quantity of sleep.  Allowed pt to explore and express thoughts and feelings associated with recent life situations and external stressors.  Patient identifies signing the new lease at her apartment as a current major external stressor.  Patient states that she is very torn--patient does not know whether she needs to stay in her apartment, or move in with her daughter.  Patient states that currently her daughter is continuing to struggle financially, so patient is helping her daughter pay her rent and bills in addition to paying her own rent and bills.  Patient made the statement " if I move in with my daughter, then I will have to pay my bills, just help with her".  Discussed importance of appropriate problem solving strategies when making big decisions--assisted patient by making a pros and cons worksheet, and allowing patient to take it home to complete.  Completed 90% of the worksheet in session.  Explored importance of patient prioritizing her  own needs, and not putting her needs to the side to please others.  Patient states that she feels that she is doing a better job of setting limits and boundaries with herself whenever she notices that she is ruminating about traumas from the past.  Discussed importance of self-care, and how patient needs to engage in more self-care and positive social activities.  Patient reflects understanding and willingness to cooperate.  Patient reports that she feels good about the progress that she has made as far as organizing her the contents inside of her apartment.  Patient states that her personal belongings are organized and her furniture is placed in a way that she is happy with.  Continued recommendations are as follows: self care behaviors, positive social engagements, focusing on overall work/home/life balance, and focusing on positive physical and emotional wellness.   Suicidal/Homicidal: No  Therapist Response: Pt is continuing to apply interventions learned in session into daily life situations. Pt is currently on track to meet goals utilizing interventions mentioned above. Personal growth and progress noted. Treatment to continue as indicated.   Patient is demonstrating continued development in overall self-awareness, and improvements and life management behaviors.  Patient is demonstrating that she is able to identify concerns and brainstorm multiple solutions.  Plan: Return again in 4 weeks.  Diagnosis:  Encounter Diagnoses  Name Primary?   GAD (generalized anxiety disorder) Yes   MDD (major depressive disorder), recurrent episode, moderate ([place])     Collaboration of Care: Other Pt encouraged to continue care with psychiatrist of record, Dr. Damita Dunnings  Patient/Guardian was advised Release of Information must be obtained prior to any record release in order  to collaborate their care with an outside provider. Patient/Guardian was advised if they have not already done so to contact the  registration department to sign all necessary forms in order for [place] to release information regarding their care.   Consent: Patient/Guardian gives verbal consent for treatment and assignment of benefits for services provided during this visit. Patient/Guardian expressed understanding and agreed to proceed.   [place], LCSW 09/09/2022

## 2022-09-10 ENCOUNTER — Encounter (HOSPITAL_COMMUNITY): Payer: Self-pay | Admitting: Student in an Organized Health Care Education/Training Program

## 2022-09-10 ENCOUNTER — Ambulatory Visit (HOSPITAL_BASED_OUTPATIENT_CLINIC_OR_DEPARTMENT_OTHER): Payer: Medicare Other | Admitting: Student in an Organized Health Care Education/Training Program

## 2022-09-10 DIAGNOSIS — F331 Major depressive disorder, recurrent, moderate: Secondary | ICD-10-CM

## 2022-09-10 MED ORDER — SERTRALINE HCL 25 MG PO TABS: 75.0000 mg | ORAL_TABLET | Freq: Every day | ORAL | 2 refills | Status: DC

## 2022-09-10 MED FILL — PROGRAF 0.5 MG CAPSULE: ORAL | 30 days supply | Qty: 180 | Fill #2
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
## 2022-09-10 NOTE — Patient Instructions (Addendum)
[facility] [place], [place], [place] [address] Located in the [facility] [email or web address]  2. [place] [address] [place], [place] [address]  You saw both Edd Fabian (Supervising Attending) and Dr. Candie Chroman

## 2022-09-11 ENCOUNTER — Other Ambulatory Visit: Payer: Self-pay

## 2022-09-11 ENCOUNTER — Encounter (HOSPITAL_BASED_OUTPATIENT_CLINIC_OR_DEPARTMENT_OTHER): Payer: Self-pay

## 2022-09-11 ENCOUNTER — Emergency Department (HOSPITAL_BASED_OUTPATIENT_CLINIC_OR_DEPARTMENT_OTHER): Payer: Medicare Other

## 2022-09-11 ENCOUNTER — Emergency Department (HOSPITAL_BASED_OUTPATIENT_CLINIC_OR_DEPARTMENT_OTHER)
Admission: EM | Admit: 2022-09-11 | Discharge: 2022-09-11 | Disposition: A | Discharge status: Home / Self Care | Payer: Medicare Other | Attending: Emergency Medicine | Admitting: Emergency Medicine

## 2022-09-11 DIAGNOSIS — Z0389 Encounter for observation for other suspected diseases and conditions ruled out: Secondary | ICD-10-CM | POA: Diagnosis not present

## 2022-09-11 DIAGNOSIS — I10 Essential (primary) hypertension: Secondary | ICD-10-CM | POA: Diagnosis not present

## 2022-09-11 LAB — BASIC METABOLIC PANEL
Anion gap: 9 (ref 5–15)
BUN: 19 mg/dL (ref 8–23)
CO2: 28 mmol/L (ref 22–32)
Calcium: 9.4 mg/dL (ref 8.9–10.3)
Chloride: 102 mmol/L (ref 98–111)
Creatinine, Ser: 1.09 mg/dL — ABNORMAL HIGH (ref 0.44–1.00)
GFR, Estimated: 53 mL/min — ABNORMAL LOW (ref >60)
Glucose, Bld: 91 mg/dL (ref 70–99)
Potassium: 5 mmol/L (ref 3.5–5.1)
Sodium: 139 mmol/L (ref 135–145)

## 2022-09-11 LAB — CBC WITH DIFFERENTIAL/PLATELET
Abs Immature Granulocytes: 0.02 10*3/uL (ref 0.00–0.07)
Basophils Absolute: 0.1 10*3/uL (ref 0.0–0.1)
Basophils Relative: 1 %
Eosinophils Absolute: 0.1 10*3/uL (ref 0.0–0.5)
Eosinophils Relative: 1 %
HCT: 38.4 % (ref 36.0–46.0)
Hemoglobin: 13.5 g/dL (ref 12.0–15.0)
Immature Granulocytes: 0 %
Lymphocytes Relative: 23 %
Lymphs Abs: 1.6 10*3/uL (ref 0.7–4.0)
MCH: 32 pg (ref 26.0–34.0)
MCHC: 35.2 g/dL (ref 30.0–36.0)
MCV: 91 fL (ref 80.0–100.0)
Monocytes Absolute: 0.4 10*3/uL (ref 0.1–1.0)
Monocytes Relative: 6 %
Neutro Abs: 4.8 10*3/uL (ref 1.7–7.7)
Neutrophils Relative %: 69 %
Platelets: 176 10*3/uL (ref 150–400)
RBC: 4.22 MIL/uL (ref 3.87–5.11)
RDW: 12.2 % (ref 11.5–15.5)
WBC: 7 10*3/uL (ref 4.0–10.5)
nRBC: 0 % (ref 0.0–0.2)

## 2022-09-11 LAB — URINALYSIS, ROUTINE W REFLEX MICROSCOPIC
Bacteria, UA: NONE SEEN
Bilirubin Urine: NEGATIVE
Glucose, UA: NEGATIVE mg/dL
Ketones, ur: NEGATIVE mg/dL
Leukocytes,Ua: NEGATIVE
Nitrite: NEGATIVE
Protein, ur: NEGATIVE mg/dL
Specific Gravity, Urine: 1.012 (ref 1.005–1.030)
pH: 7 (ref 5.0–8.0)

## 2022-09-11 LAB — TROPONIN I (HIGH SENSITIVITY)
Troponin I (High Sensitivity): 31 ng/L — ABNORMAL HIGH (ref <18)
Troponin I (High Sensitivity): 35 ng/L — ABNORMAL HIGH (ref <18)

## 2022-09-11 MED ORDER — AMLODIPINE BESYLATE 2.5 MG PO TABS: 2.5000 mg | ORAL_TABLET | Freq: Every day | ORAL | 0 refills | Status: DC

## 2022-09-11 MED ORDER — AMLODIPINE BESYLATE 5 MG PO TABS
2.5000 mg | ORAL_TABLET | Freq: Once | ORAL | Status: AC
  Administered 2022-09-11: 2.5 mg via ORAL
  Filled 2022-09-11: qty 1

## 2022-09-11 NOTE — ED Notes (Signed)
Requested urine from patient. Unable to provide sample at this time. Will ring for assistance when able, sample cup at bedside and clean catch instructions given

## 2022-09-11 NOTE — ED Triage Notes (Signed)
Pt c/o hypertension, pt denies associated symptoms. Found "incidentally" yesterday during PCP appt, denies known hx of same.  Hx TIA, aneurysm, denies remaining deficits.

## 2022-09-11 NOTE — ED Provider Notes (Signed)
[facility] Provider Note   CSN: [id number] Arrival date & time: 09/11/22  1349     History No chief complaint on file.   HPI Candace Hill is a 77 y.o. female presenting for asymptomatic hypertension.  77 year old female with extensive medical history states that she was seen by her PCP and normal follow-up yesterday found to have elevated high blood pressure during the visit.  She has been monitoring over the last 24 hours and said multiple serial blood pressures with systolics in the [id number].  History of hypertension and systolics in the 0000000 not on therapy for years.  Denies any symptoms including fevers chills nausea vomiting syncope shortness of breath or chest pain.  Otherwise ambulatory tolerating p.o. intake at this time..   Patient's recorded medical, surgical, social, medication list and allergies were reviewed in the Snapshot window as part of the initial history.   Review of Systems   Review of Systems  Constitutional:  Negative for chills and fever.  HENT:  Negative for ear pain and sore throat.   Eyes:  Negative for pain and visual disturbance.  Respiratory:  Negative for cough and shortness of breath.   Cardiovascular:  Negative for chest pain and palpitations.  Gastrointestinal:  Negative for abdominal pain and vomiting.  Genitourinary:  Negative for dysuria and hematuria.  Musculoskeletal:  Negative for arthralgias and back pain.  Skin:  Negative for color change and rash.  Neurological:  Negative for seizures and syncope.  All other systems reviewed and are negative.   Physical Exam Updated Vital Signs BP (!) 193/89   Pulse 66   Temp (!) 97 F (36.1 C)   Resp 18   SpO2 100%  Physical Exam Vitals and nursing note reviewed.  Constitutional:      General: She is not in acute distress.    Appearance: She is well-developed.  HENT:     Head: Normocephalic and atraumatic.  Eyes:     Conjunctiva/sclera:  Conjunctivae normal.  Cardiovascular:     Rate and Rhythm: Normal rate and regular rhythm.     Heart sounds: No murmur heard. Pulmonary:     Effort: Pulmonary effort is normal. No respiratory distress.     Breath sounds: Normal breath sounds.  Abdominal:     General: There is no distension.     Palpations: Abdomen is soft.     Tenderness: There is no abdominal tenderness. There is no right CVA tenderness or left CVA tenderness.  Musculoskeletal:        General: No swelling or tenderness. Normal range of motion.     Cervical back: Neck supple.  Skin:    General: Skin is warm and dry.  Neurological:     General: No focal deficit present.     Mental Status: She is alert and oriented to person, place, and time. Mental status is at baseline.     Cranial Nerves: No cranial nerve deficit.      ED Course/ Medical Decision Making/ A&P    Procedures Procedures   Medications Ordered in ED Medications  amLODipine (NORVASC) tablet 2.5 mg (2.5 mg Oral Given 09/11/22 1739)   Medical Decision Making:   Candace Hill is a 77 y.o. female who presented to the ED today with chief complaint of SOB detailed above.  Complete initial physical exam performed, notably the patient  was HDS in NAD.    Reviewed and confirmed nursing documentation for past medical history, family history, social history.  Initial Assessment:   With the patient's presentation of elevated blood pressure readings, most likely diagnosis is hypertensive urgency. Other diagnoses associated with hypertensive emergency were considered including (but not limited to) intracranial hemorrhage, acute renal artery stenosis, acute kidney injury, myocardial stress, ophthalmologic emergencies. These are considered less likely due to history of present illness and physical exam findings.   This is most consistent with an acute life/limb threatening illness complicated by underlying chronic conditions. Will evaluate for hypertensive  emergency as below. Initial Plan:  Screening labs including CBC and Metabolic panel to evaluate for infectious or metabolic etiology of disease.  Urinalysis with reflex culture ordered to evaluate for UTI or relevant urologic/nephrologic pathology.  CXR to evaluate for structural/infectious intrathoracic pathology.  Serial troponin/EKG to evaluate for cardiac pathology. Objective evaluation as below reviewed. Considered further administration of antihypertensives in ED, per consensus guidelines for [facility] of emergency physicians, acute treatment of hypertensive urgency alone in the emergency department is not recommended.  If patient has evidence of hypertensive emergency on objective laboratory evaluation, will reevaluate.  Will monitor blood pressure while patient awaiting above laboratory studies.  Initial Study Results:   Laboratory  All laboratory results reviewed without evidence of clinically relevant pathology.   Exceptions include: Initial troponin 31, serial 34  EKG EKG was reviewed independently. Rate, rhythm, axis, intervals all examined and without medically relevant abnormality. ST segments without concerns for elevations.    Radiology:  All images reviewed independently. Agree with radiology report at this time.   DG Chest Portable 1 View  Result Date: 09/11/2022 CLINICAL DATA:  Hypertensive urgency versus emergency. EXAM: PORTABLE CHEST 1 VIEW COMPARISON:  None Available. FINDINGS: Clear lungs. Normal heart size and mediastinal contours. No pleural effusion or pneumothorax. Visualized bones and upper abdomen are unremarkable. IMPRESSION: No evidence of acute cardiopulmonary disease. Electronically Signed   By: Emmit Alexanders M.D.   On: 09/11/2022 15:15   Final Assessment and Plan:   Reassessed patient after extensive observation emergency room.  She remains hypertensive.  No evidence of hypertensive emergency at this time.  Given her history of presyncope, patient  will require close titration of medications, will start her on low-dose amlodipine given her other medical problems and have her follow-up with primary care provider for up titration, close management.  She will need within 48-hour follow-up or strict return precautions regarding interval worsening symptoms.  No evidence of ACS on exam.  Given only mild delta troponin, lack of symptoms, well appearance, no indication for further intervention in the emergency room at this time.  We discussed further observation given rising troponin, however given indeterminate nature of the study in this patient, overall well appearance otherwise, patient feels comfortable outpatient care management.  Disposition:  I have considered need for hospitalization, however, considering all of the above, I believe this patient is stable for discharge at this time.  Patient/family educated about specific return precautions for given chief complaint and symptoms.  Patient/family educated about follow-up with PCP.     Patient/family expressed understanding of return precautions and need for follow-up. Patient spoken to regarding all imaging and laboratory results and appropriate follow up for these results. All education provided in verbal form with additional information in written form. Time was allowed for answering of patient questions. Patient discharged.    Emergency Department Medication Summary:   Medications  amLODipine (NORVASC) tablet 2.5 mg (2.5 mg Oral Given 09/11/22 1739)          Clinical Impression:  1.  Hypertension, unspecified type      Discharge   Final Clinical Impression(s) / ED Diagnoses Final diagnoses:  Hypertension, unspecified type    Rx / DC Orders ED Discharge Orders          Ordered    amLODipine (NORVASC) 2.5 MG tablet  Daily        09/11/22 1733              Tretha Sciara, MD 09/11/22 [phone]

## 2022-09-11 NOTE — ED Notes (Signed)
Reviewed AVS/discharge instruction with patient. Time allotted for and all questions answered. Patient is agreeable for d/c and escorted to ed exit by staff.  

## 2022-09-14 ENCOUNTER — Telehealth: Payer: Self-pay | Admitting: Physician Assistant

## 2022-09-14 DIAGNOSIS — Z5181 Encounter for therapeutic drug level monitoring: Principal | ICD-10-CM

## 2022-09-14 DIAGNOSIS — E612 Magnesium deficiency: Principal | ICD-10-CM

## 2022-09-14 DIAGNOSIS — Z944 Liver transplant status: Principal | ICD-10-CM

## 2022-09-14 NOTE — Telephone Encounter (Signed)
Pt's daughter called in and left a message with the access nurse on 09/11/22. 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 She is on 2 new meds for anxiety. 180/90 not on meds. No s/s.  Full access nurse report is in [employer]

## 2022-09-14 NOTE — Telephone Encounter (Signed)
POA advised to PCP

## 2022-09-14 NOTE — Telephone Encounter (Signed)
Need to contact PCP for Blood presssure, will call patient back

## 2022-09-16 ENCOUNTER — Other Ambulatory Visit: Payer: Self-pay

## 2022-09-16 MED ORDER — AMLODIPINE BESYLATE 2.5 MG PO TABS: 2.5000 mg | ORAL_TABLET | Freq: Every day | ORAL | 0 refills | Status: DC

## 2022-09-17 DIAGNOSIS — H26491 Other secondary cataract, right eye: Secondary | ICD-10-CM | POA: Diagnosis not present

## 2022-09-17 DIAGNOSIS — H34812 Central retinal vein occlusion, left eye, with macular edema: Secondary | ICD-10-CM | POA: Diagnosis not present

## 2022-09-17 DIAGNOSIS — Z961 Presence of intraocular lens: Secondary | ICD-10-CM | POA: Diagnosis not present

## 2022-09-18 ENCOUNTER — Other Ambulatory Visit: Payer: Self-pay | Admitting: Internal Medicine

## 2022-09-18 DIAGNOSIS — F339 Major depressive disorder, recurrent, unspecified: Secondary | ICD-10-CM

## 2022-09-18 DIAGNOSIS — F419 Anxiety disorder, unspecified: Secondary | ICD-10-CM

## 2022-09-21 ENCOUNTER — Telehealth: Payer: Self-pay | Admitting: Internal Medicine

## 2022-09-21 DIAGNOSIS — F0670 Mild neurocognitive disorder due to known physiological condition without behavioral disturbance: Secondary | ICD-10-CM

## 2022-09-21 DIAGNOSIS — E612 Magnesium deficiency: Principal | ICD-10-CM

## 2022-09-21 DIAGNOSIS — Z944 Liver transplant status: Principal | ICD-10-CM

## 2022-09-21 DIAGNOSIS — Z5181 Encounter for therapeutic drug level monitoring: Principal | ICD-10-CM

## 2022-09-21 NOTE — Telephone Encounter (Signed)
Pt called stated Her other Dr told her she couldn't drive anymore, so pt was looking for transportation to get to her Dr appt. So pt need a referral sent to help assist her.

## 2022-09-21 NOTE — Unmapped (Signed)
Called pt and scheduled annual appt in person at [facility] on 11/19/22. Explained where [facility] is and told her directions will be in appt letter. Pt said she can no longer drive bc she can't see out of left eye and hopes to get a ride. Told her not to worry and to please call this tpa if she has any trouble finding a ride. Pt said [facility] techs were unable to draw blood out of either arm last time she went. Said she went to ER about 10 days ago for another reason and her blood was drawn after several attempts. Told her this tpa will call hospital ([facility] in [place] owned by [facility]) and get lab results. She expressed worry about the blood draw issue as far as being a good liver tx pt.    Pt very low during call. Said she has blood pressure issue now, is going downhill quickly, and has memory issues. She said daughter wants her to move in with her, but daughter has just adopted her (daughter's) 45 year old grandson whose mother is schizophrenic. She says boy is bipolar but living with daughter may be her only choice. Pt got teary at the end of conversation. Asked if she wanted this tpa to stay on phone with her as she cried, and she said no because there's nothing this tpa can do. Provided emotional support throughout phone call. Did not bring up getting labs before appt due to pt's state of mind and recent difficulty of techs finding a vein. Confirmed her address after she said she had moved, and she verbalized understanding of appt.

## 2022-09-22 ENCOUNTER — Telehealth: Payer: Self-pay | Admitting: *Deleted

## 2022-09-22 NOTE — Telephone Encounter (Signed)
   Telephone encounter was:  Successful.  09/22/2022 Name: Lizzette Morang MRN: [id number] DOB: Jan 08, 1946  Nashaya Haitz is a 77 y.o. year old female who is a primary care patient of Etta Grandchild, MD . The community resource team was consulted for assistance with Transportation Needs   Care guide performed the following interventions: Follow up call placed to community resources to determine status of patients referral.Not needing transportation at this moment but will call the [facility] transportation when needed . Will also Mail [place] mobility application for transportation   Follow Up Plan:  No further follow up planned at this time. The patient has been provided with needed resources.  Yehuda Mao Greenauer -[facility] [place], Population Health [phone] [address] [place] , [place] [address] Email : [email or web address]

## 2022-09-22 NOTE — Telephone Encounter (Signed)
Patient called to check on the status of her getting transportation. She would like a call back at [phone].

## 2022-09-23 ENCOUNTER — Ambulatory Visit (HOSPITAL_COMMUNITY): Payer: Medicare Other | Admitting: Licensed Clinical Social Worker

## 2022-09-24 ENCOUNTER — Encounter: Payer: Self-pay | Admitting: Internal Medicine

## 2022-09-24 ENCOUNTER — Other Ambulatory Visit: Payer: Self-pay | Admitting: Internal Medicine

## 2022-09-24 ENCOUNTER — Ambulatory Visit (INDEPENDENT_AMBULATORY_CARE_PROVIDER_SITE_OTHER): Payer: Medicare Other | Admitting: Internal Medicine

## 2022-09-24 VITALS — BP 162/82 | HR 63 | Temp 97.8°F | Resp 16 | Ht 63.0 in | Wt 130.0 lb

## 2022-09-24 DIAGNOSIS — N1832 Chronic kidney disease, stage 3b: Secondary | ICD-10-CM | POA: Diagnosis not present

## 2022-09-24 DIAGNOSIS — F01B4 Vascular dementia, moderate, with anxiety: Secondary | ICD-10-CM

## 2022-09-24 DIAGNOSIS — F339 Major depressive disorder, recurrent, unspecified: Secondary | ICD-10-CM

## 2022-09-24 DIAGNOSIS — I10 Essential (primary) hypertension: Secondary | ICD-10-CM

## 2022-09-24 DIAGNOSIS — F419 Anxiety disorder, unspecified: Secondary | ICD-10-CM

## 2022-09-24 MED ORDER — AMLODIPINE BESYLATE 5 MG PO TABS: 5.0000 mg | ORAL_TABLET | Freq: Every day | ORAL | 0 refills | Status: DC

## 2022-09-24 MED ORDER — OLMESARTAN MEDOXOMIL 20 MG PO TABS: 20.0000 mg | ORAL_TABLET | Freq: Every day | ORAL | 0 refills | Status: DC

## 2022-09-24 NOTE — Progress Notes (Signed)
Subjective:  Patient ID: Candace Hill, female    DOB: 05/25/1946  Age: 77 y.o. MRN: [id number]  CC: Hypertension   HPI Candace Northatricia Inman presents for f/up ---    She was recently seen in the ED for poorly controlled hypertension.  She has been monitoring her blood pressure at home and says it is not adequately well-controlled with an occasional systolic blood pressure in the 160s to 180s.  She has been diagnosed with vascular dementia.  She denies headache, blurred vision, chest pain, shortness of breath, palpitations, or edema.  Outpatient Medications Prior to Visit  Medication Sig Dispense Refill   aspirin EC 81 MG tablet Take 81 mg by mouth daily.     atorvastatin (LIPITOR) 10 MG tablet Take 1 tablet (10 mg total) by mouth daily. 90 tablet 1   Biotin 800 MCG TABS Take 1 tablet by mouth.     CALCIUM-VITAMIN D PO Take 600 mg by mouth daily.     Ferrous Sulfate (IRON) 325 (65 Fe) MG TABS Take 1 tablet by mouth daily.     folic acid (FOLVITE) 400 MCG tablet Take 800 mcg by mouth daily.     gabapentin (NEURONTIN) 300 MG capsule Take 1 capsule (300 mg total) by mouth at bedtime. 30 capsule 2   memantine (NAMENDA) 5 MG tablet Take 1 tablet (5 mg at night) for 2 weeks, then increase to 1 tablet (5 mg) twice a day 60 tablet 11   sertraline (ZOLOFT) 25 MG tablet Take 3 tablets (75 mg total) by mouth daily. 90 tablet 2   tacrolimus (PROGRAF) 0.5 MG capsule 6 by mouth daily NAME BRAND ONLY     amLODipine (NORVASC) 2.5 MG tablet Take 1 tablet (2.5 mg total) by mouth daily. 14 tablet 0   No facility-administered medications prior to visit.    ROS Review of Systems  Constitutional: Negative.  Negative for diaphoresis, fatigue and unexpected weight change.  HENT: Negative.    Eyes: Negative.   Respiratory:  Negative for cough, chest tightness, shortness of breath and wheezing.   Cardiovascular:  Negative for chest pain, palpitations and leg swelling.  Gastrointestinal:  Negative for abdominal  pain, constipation, diarrhea, nausea and vomiting.  Endocrine: Negative.   Genitourinary: Negative.  Negative for difficulty urinating and dysuria.  Musculoskeletal: Negative.  Negative for arthralgias and neck pain.  Skin: Negative.  Negative for color change and pallor.  Neurological:  Negative for dizziness, weakness, light-headedness and numbness.  Hematological:  Negative for adenopathy. Does not bruise/bleed easily.  Psychiatric/Behavioral:  Positive for confusion, decreased concentration and dysphoric mood. Negative for suicidal ideas. The patient is nervous/anxious.     Objective:  BP (!) 162/82 (BP Location: Right Arm, Patient Position: Sitting, Cuff Size: Large)   Pulse 63   Temp 97.8 F (36.6 C) (Oral)   Resp 16   Ht 5\' 3"  (1.6 m)   Wt 130 lb (59 kg)   SpO2 96%   BMI 23.03 kg/m   BP Readings from Last 3 Encounters:  09/24/22 (!) 162/82  09/11/22 (!) 193/89  07/06/22 110/64    Wt Readings from Last 3 Encounters:  09/24/22 130 lb (59 kg)  07/06/22 123 lb (55.8 kg)  05/06/22 122 lb (55.3 kg)    Physical Exam Vitals reviewed.  HENT:     Nose: Nose normal.     Mouth/Throat:     Mouth: Mucous membranes are moist.  Eyes:     General: No scleral icterus.    Conjunctiva/sclera: Conjunctivae  normal.  Cardiovascular:     Rate and Rhythm: Normal rate and regular rhythm.     Heart sounds: No murmur heard.    No gallop.  Pulmonary:     Effort: Pulmonary effort is normal.     Breath sounds: No stridor. No wheezing, rhonchi or rales.  Abdominal:     General: Abdomen is flat.     Palpations: There is no mass.     Tenderness: There is no abdominal tenderness. There is no guarding.     Hernia: No hernia is present.  Musculoskeletal:        General: Normal range of motion.     Cervical back: Neck supple.     Right lower leg: No edema.     Left lower leg: No edema.  Lymphadenopathy:     Cervical: No cervical adenopathy.  Skin:    General: Skin is warm and dry.   Neurological:     General: No focal deficit present.     Mental Status: She is alert. Mental status is at baseline.  Psychiatric:        Attention and Perception: She is inattentive.        Mood and Affect: Mood is anxious and depressed. Affect is tearful.        Speech: Speech is tangential. Speech is not delayed.        Behavior: Behavior normal. Behavior is not agitated. Behavior is cooperative.        Thought Content: Thought content normal. Thought content is not paranoid or delusional. Thought content does not include homicidal or suicidal ideation.        Cognition and Memory: Cognition is impaired. Memory is impaired.     Lab Results  Component Value Date   WBC 7.0 09/11/2022   HGB 13.5 09/11/2022   HCT 38.4 09/11/2022   PLT 176 09/11/2022   GLUCOSE 91 09/11/2022   CHOL 202 (H) 03/12/2022   TRIG 86.0 03/12/2022   HDL 51.20 03/12/2022   LDLCALC 133 (H) 03/12/2022   ALT 7 05/06/2022   AST 15 05/06/2022   NA 139 09/11/2022   K 5.0 09/11/2022   CL 102 09/11/2022   CREATININE 1.09 (H) 09/11/2022   BUN 19 09/11/2022   CO2 28 09/11/2022   TSH 1.91 03/12/2022   INR 0.9 04/15/2021   HGBA1C 5.4 03/12/2022    DG Chest Portable 1 View  Result Date: 09/11/2022 CLINICAL DATA:  Hypertensive urgency versus emergency. EXAM: PORTABLE CHEST 1 VIEW COMPARISON:  None Available. FINDINGS: Clear lungs. Normal heart size and mediastinal contours. No pleural effusion or pneumothorax. Visualized bones and upper abdomen are unremarkable. IMPRESSION: No evidence of acute cardiopulmonary disease. Electronically Signed   By: Orvan Falconer M.D.   On: 09/11/2022 15:15    Assessment & Plan:    Essential hypertension-- Her blood pressure is not adequately well-controlled.  Will increase the dose of amlodipine and add an ARB. -     amLODIPine Besylate; Take 1 tablet (5 mg total) by mouth daily.  Dispense: 90 tablet; Refill: 0 -     Olmesartan Medoxomil; Take 1 tablet (20 mg total) by mouth  daily.  Dispense: 90 tablet; Refill: 0  Stage 3b chronic kidney disease- Her renal function is stable.  Moderate vascular dementia with anxiety- Will try to get better control of her blood pressure.     Follow-up: Return in about 3 months (around 12/24/2022).  Sanda Linger, MD

## 2022-09-24 NOTE — Patient Instructions (Signed)
Hypertension, Adult High blood pressure (hypertension) is when the force of blood pumping through the arteries is too strong. The arteries are the blood vessels that carry blood from the heart throughout the body. Hypertension forces the heart to work harder to pump blood and may cause arteries to become narrow or stiff. Untreated or uncontrolled hypertension can lead to a heart attack, heart failure, a stroke, kidney disease, and other problems. A blood pressure reading consists of a higher number over a lower number. Ideally, your blood pressure should be below 120/80. The first ("top") number is called the systolic pressure. It is a measure of the pressure in your arteries as your heart beats. The second ("bottom") number is called the diastolic pressure. It is a measure of the pressure in your arteries as the heart relaxes. What are the causes? The exact cause of this condition is not known. There are some conditions that result in high blood pressure. What increases the risk? Certain factors may make you more likely to develop high blood pressure. Some of these risk factors are under your control, including: Smoking. Not getting enough exercise or physical activity. Being overweight. Having too much fat, sugar, calories, or salt (sodium) in your diet. Drinking too much alcohol. Other risk factors include: Having a personal history of heart disease, diabetes, high cholesterol, or kidney disease. Stress. Having a family history of high blood pressure and high cholesterol. Having obstructive sleep apnea. Age. The risk increases with age. What are the signs or symptoms? High blood pressure may not cause symptoms. Very high blood pressure (hypertensive crisis) may cause: Headache. Fast or irregular heartbeats (palpitations). Shortness of breath. Nosebleed. Nausea and vomiting. Vision changes. Severe chest pain, dizziness, and seizures. How is this diagnosed? This condition is diagnosed by  measuring your blood pressure while you are seated, with your arm resting on a flat surface, your legs uncrossed, and your feet flat on the floor. The cuff of the blood pressure monitor will be placed directly against the skin of your upper arm at the level of your heart. Blood pressure should be measured at least twice using the same arm. Certain conditions can cause a difference in blood pressure between your right and left arms. If you have a high blood pressure reading during one visit or you have normal blood pressure with other risk factors, you may be asked to: Return on a different day to have your blood pressure checked again. Monitor your blood pressure at home for 1 week or longer. If you are diagnosed with hypertension, you may have other blood or imaging tests to help your health care provider understand your overall risk for other conditions. How is this treated? This condition is treated by making healthy lifestyle changes, such as eating healthy foods, exercising more, and reducing your alcohol intake. You may be referred for counseling on a healthy diet and physical activity. Your health care provider may prescribe medicine if lifestyle changes are not enough to get your blood pressure under control and if: Your systolic blood pressure is above 130. Your diastolic blood pressure is above 80. Your personal target blood pressure may vary depending on your medical conditions, your age, and other factors. Follow these instructions at home: Eating and drinking  Eat a diet that is high in fiber and potassium, and low in sodium, added sugar, and fat. An example of this eating plan is called the DASH diet. DASH stands for Dietary Approaches to Stop Hypertension. To eat this way: Eat   plenty of fresh fruits and vegetables. Try to fill one half of your plate at each meal with fruits and vegetables. Eat whole grains, such as whole-wheat pasta, brown rice, or whole-grain bread. Fill about one  fourth of your plate with whole grains. Eat or drink low-fat dairy products, such as skim milk or low-fat yogurt. Avoid fatty cuts of meat, processed or cured meats, and poultry with skin. Fill about one fourth of your plate with lean proteins, such as fish, chicken without skin, beans, eggs, or tofu. Avoid pre-made and processed foods. These tend to be higher in sodium, added sugar, and fat. Reduce your daily sodium intake. Many people with hypertension should eat less than 1,500 mg of sodium a day. Do not drink alcohol if: Your health care provider tells you not to drink. You are pregnant, may be pregnant, or are planning to become pregnant. If you drink alcohol: Limit how much you have to: 0-1 drink a day for women. 0-2 drinks a day for men. Know how much alcohol is in your drink. In the [place] one drink equals one 12 oz bottle of beer (355 mL), one 5 oz glass of wine (148 mL), or one 1 oz glass of hard liquor (44 mL). Lifestyle  Work with your health care provider to maintain a healthy body weight or to lose weight. Ask what an ideal weight is for you. Get at least 30 minutes of exercise that causes your heart to beat faster (aerobic exercise) most days of the week. Activities may include walking, swimming, or biking. Include exercise to strengthen your muscles (resistance exercise), such as Pilates or lifting weights, as part of your weekly exercise routine. Try to do these types of exercises for 30 minutes at least 3 days a week. Do not use any products that contain nicotine or tobacco. These products include cigarettes, chewing tobacco, and vaping devices, such as e-cigarettes. If you need help quitting, ask your health care provider. Monitor your blood pressure at home as told by your health care provider. Keep all follow-up visits. This is important. Medicines Take over-the-counter and prescription medicines only as told by your health care provider. Follow directions carefully. Blood  pressure medicines must be taken as prescribed. Do not skip doses of blood pressure medicine. Doing this puts you at risk for problems and can make the medicine less effective. Ask your health care provider about side effects or reactions to medicines that you should watch for. Contact a health care provider if you: Think you are having a reaction to a medicine you are taking. Have headaches that keep coming back (recurring). Feel dizzy. Have swelling in your ankles. Have trouble with your vision. Get help right away if you: Develop a severe headache or confusion. Have unusual weakness or numbness. Feel faint. Have severe pain in your chest or abdomen. Vomit repeatedly. Have trouble breathing. These symptoms may be an emergency. Get help right away. Call 911. Do not wait to see if the symptoms will go away. Do not drive yourself to the hospital. Summary Hypertension is when the force of blood pumping through your arteries is too strong. If this condition is not controlled, it may put you at risk for serious complications. Your personal target blood pressure may vary depending on your medical conditions, your age, and other factors. For most people, a normal blood pressure is less than 120/80. Hypertension is treated with lifestyle changes, medicines, or a combination of both. Lifestyle changes include losing weight, eating a healthy,   low-sodium diet, exercising more, and limiting alcohol. This information is not intended to replace advice given to you by your health care provider. Make sure you discuss any questions you have with your health care provider. Document Revised: 04/08/2021 Document Reviewed: 04/08/2021 Elsevier Patient Education  2023 Elsevier Inc.  

## 2022-09-25 DIAGNOSIS — F01B4 Vascular dementia, moderate, with anxiety: Secondary | ICD-10-CM | POA: Insufficient documentation

## 2022-09-28 DIAGNOSIS — Z5181 Encounter for therapeutic drug level monitoring: Principal | ICD-10-CM

## 2022-09-28 DIAGNOSIS — Z944 Liver transplant status: Principal | ICD-10-CM

## 2022-09-28 DIAGNOSIS — E612 Magnesium deficiency: Principal | ICD-10-CM

## 2022-09-29 ENCOUNTER — Telehealth (HOSPITAL_COMMUNITY): Payer: Self-pay | Admitting: *Deleted

## 2022-09-29 NOTE — Telephone Encounter (Signed)
Patient is not taking this anymore, due to adverse side effects.

## 2022-09-29 NOTE — Telephone Encounter (Signed)
Gabapentin  300 mg, Oral, Daily at bedtime Dx Associated: Different Encounter:  Start Date & Time: 06/22/2022 Ord/Sold: 06/22/2022 (O) Ordered On: 06/22/2022 ReportPharmacy: [facility] [id number] - [place], [place] - [address]

## 2022-09-30 ENCOUNTER — Ambulatory Visit: Payer: Medicare Other | Admitting: Internal Medicine

## 2022-10-05 DIAGNOSIS — E612 Magnesium deficiency: Principal | ICD-10-CM

## 2022-10-05 DIAGNOSIS — Z5181 Encounter for therapeutic drug level monitoring: Principal | ICD-10-CM

## 2022-10-05 DIAGNOSIS — Z944 Liver transplant status: Principal | ICD-10-CM

## 2022-10-05 NOTE — Unmapped (Addendum)
5/1: package was not able to be delivered by ups. Per JB, mgmt is contacting parcel shield to have ups redeliver it and communicating with patient Kristy Hanson      [facility] Refill Coordination Note    Specialty Medication(s) to be Shipped:   Transplant: Prograf 0.5mg     Other medication(s) to be shipped: No additional medications requested for fill at this time     Kristy Hanson, DOB: 07-25-1945  Phone: [phone] (home)       All above HIPAA information was verified with patient.     Was a Nurse, learning disability used for this call? No    Completed refill call assessment today to schedule patient's medication shipment from the [facility] [phone]).  All relevant notes have been reviewed.     Specialty medication(s) and dose(s) confirmed: Regimen is correct and unchanged.   Changes to medications: Joelie reports no changes at this time.  Changes to insurance: No  New side effects reported not previously addressed with a pharmacist or physician: None reported  Questions for the pharmacist: No    Confirmed patient received a Conservation officer, historic buildings and a Surveyor, mining with first shipment. The patient will receive a drug information handout for each medication shipped and additional FDA Medication Guides as required.       DISEASE/MEDICATION-SPECIFIC INFORMATION        N/A    SPECIALTY MEDICATION ADHERENCE     Medication Adherence    Patient reported X missed doses in the last month: 0  Specialty Medication: PROGRAF 0.5 mg capsule (tacrolimus)  Patient is on additional specialty medications: No  Patient is on more than two specialty medications: No              Were doses missed due to medication being on hold? No    Prograf 0.5 mg: 7 days of medicine on hand       REFERRAL TO PHARMACIST     Referral to the pharmacist: Not needed      [facility]     Shipping address confirmed in Epic.     Delivery Scheduled: Yes, Expected medication delivery date: 10/08/22.     Medication will be delivered via UPS to the prescription address in Epic WAM.    Ernestine Mcmurray   [facility] Specialty Technician

## 2022-10-07 MED FILL — PROGRAF 0.5 MG CAPSULE: ORAL | 30 days supply | Qty: 180 | Fill #3

## 2022-10-12 DIAGNOSIS — Z5181 Encounter for therapeutic drug level monitoring: Principal | ICD-10-CM

## 2022-10-12 DIAGNOSIS — E612 Magnesium deficiency: Principal | ICD-10-CM

## 2022-10-12 DIAGNOSIS — Z944 Liver transplant status: Principal | ICD-10-CM

## 2022-10-12 NOTE — Unmapped (Signed)
Called pt to r/s 6/6 annual appt since provider's husband having surgery that week and had to cancel appts.  Pt rescheduled to 6/13. She was writing this down during conversation.    Pt then said she's having trouble getting her prograf delivered. It's mailed thru UPS and she usually picks it up but now local doc said she can't drive so she's trying to get UPS to deliver to her house.  Confirmed that she is not yet out of prograf as she has emergency stash as she called it.    She verbalized understanding of all discussed.

## 2022-10-16 ENCOUNTER — Telehealth: Payer: Self-pay | Admitting: Physician Assistant

## 2022-10-16 NOTE — Telephone Encounter (Signed)
Pt's daughter called in stating the patient has been getting considerably more confused over the last week. She now can't distribute her medications into her weekly pill box without messing them up. It's been making her more depressed.

## 2022-10-19 DIAGNOSIS — Z944 Liver transplant status: Principal | ICD-10-CM

## 2022-10-19 DIAGNOSIS — E612 Magnesium deficiency: Principal | ICD-10-CM

## 2022-10-19 DIAGNOSIS — Z5181 Encounter for therapeutic drug level monitoring: Principal | ICD-10-CM

## 2022-10-21 ENCOUNTER — Ambulatory Visit (INDEPENDENT_AMBULATORY_CARE_PROVIDER_SITE_OTHER): Payer: Medicare Other | Admitting: Licensed Clinical Social Worker

## 2022-10-21 DIAGNOSIS — F411 Generalized anxiety disorder: Secondary | ICD-10-CM | POA: Diagnosis not present

## 2022-10-21 NOTE — Progress Notes (Unsigned)
  THERAPIST PROGRESS NOTE  Session Time: 3-4p  Behavioral Health Outpatient [facility] in office visit for patient and LCSW clinician  Participation Level: Active  Behavioral Response: Neat and Well GroomedAlert and ConfusedAnxious and Depressed  Type of Therapy: Individual Therapy  Treatment Goals addressed: Learn and implement coping skills that result in a reduction of anxiety and worry, and improve daily functioning per pt report 3 out of 5 sessions documented   Reduce overall frequency, intensity, and duration of the anxiety so that daily functioning is not impaired per pt self report 3 out of 5 sessions documented   ProgressTowards Goals: Progressing  Interventions: Solution Focused, Supportive, and Other: trauma focused  Summary: Lowell Felicia is a 77 y.o. female who presents with symptoms consistent with anxiety disorder.  Patient reports that she is compliant with her Zoloft, and is compliant with her psychiatric appointments.  Patient reports good quality and quantity of sleep.  Assisted pt with identifying stress/anxiety triggered by childhood-lifelong trauma. 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 Discussed relativity of thoughts, behaviors, and emotions. Allowed pt to explore how traumas from the past impact current behavior patterns. Allowed pt to explore specific fears or generalized anxiety and reviewed coping skills for anxiety management.  Pt reports ongoing medical concerns about ongoing memory loss. Allowed pt to identify current symptoms, treatments, and future treatment plans/recommendations. Assisted pt with identifying how current medical state impacts overall functioning/wellness. Pt concerned with losing her drivers license--feels transportation is an issue currently.  Pt reports that relationship between self and daughter has improved.   Continued  recommendations are as follows: self care behaviors, positive social engagements, focusing on overall work/home/life balance, and focusing on positive physical and emotional wellness.   Suicidal/Homicidal: No  Therapist Response: Pt is continuing to apply interventions learned in session into daily life situations. Pt is currently on track to meet goals utilizing interventions mentioned above. Personal growth and progress noted. Treatment to continue as indicated.   Patient is demonstrating continued development in overall self-awareness, and improvements and life management behaviors.  Patient is demonstrating that she is able to identify concerns and brainstorm multiple solutions.  Plan: Return again in 4 weeks.  Diagnosis:  Encounter Diagnosis  Name Primary?   GAD (generalized anxiety disorder) Yes   Collaboration of Care: Other Pt encouraged to continue care with psychiatrist of record, Dr. Eliseo Gum  Patient/Guardian was advised Release of Information must be obtained prior to any record release in order to collaborate their care with an outside provider. Patient/Guardian was advised if they have not already done so to contact the registration department to sign all necessary forms in order for [place] to release information regarding their care.   Consent: Patient/Guardian gives verbal consent for treatment and assignment of benefits for services provided during this visit. Patient/Guardian expressed understanding and agreed to proceed.   Ernest Haber Mersades Barbaro, LCSW 10/21/2022

## 2022-10-22 ENCOUNTER — Ambulatory Visit (HOSPITAL_COMMUNITY): Payer: Medicare Other | Admitting: Student in an Organized Health Care Education/Training Program

## 2022-10-26 DIAGNOSIS — Z944 Liver transplant status: Principal | ICD-10-CM

## 2022-10-26 DIAGNOSIS — Z5181 Encounter for therapeutic drug level monitoring: Principal | ICD-10-CM

## 2022-10-26 DIAGNOSIS — E612 Magnesium deficiency: Principal | ICD-10-CM

## 2022-10-27 ENCOUNTER — Encounter (HOSPITAL_BASED_OUTPATIENT_CLINIC_OR_DEPARTMENT_OTHER): Payer: Self-pay | Admitting: Obstetrics & Gynecology

## 2022-10-27 ENCOUNTER — Other Ambulatory Visit (HOSPITAL_COMMUNITY)
Admission: RE | Admit: 2022-10-27 | Discharge: 2022-10-27 | Disposition: A | Discharge status: Home / Self Care | Payer: Medicare Other | Source: Ambulatory Visit | Attending: Obstetrics & Gynecology | Admitting: Obstetrics & Gynecology

## 2022-10-27 ENCOUNTER — Ambulatory Visit (INDEPENDENT_AMBULATORY_CARE_PROVIDER_SITE_OTHER): Payer: Medicare Other | Admitting: Obstetrics & Gynecology

## 2022-10-27 VITALS — BP 122/66 | HR 61 | Ht 62.0 in | Wt 131.0 lb

## 2022-10-27 DIAGNOSIS — Z944 Liver transplant status: Secondary | ICD-10-CM | POA: Diagnosis not present

## 2022-10-27 DIAGNOSIS — Z1151 Encounter for screening for human papillomavirus (HPV): Secondary | ICD-10-CM | POA: Insufficient documentation

## 2022-10-27 DIAGNOSIS — Z1231 Encounter for screening mammogram for malignant neoplasm of breast: Secondary | ICD-10-CM

## 2022-10-27 DIAGNOSIS — B182 Chronic viral hepatitis C: Secondary | ICD-10-CM

## 2022-10-27 DIAGNOSIS — Z124 Encounter for screening for malignant neoplasm of cervix: Secondary | ICD-10-CM

## 2022-10-27 DIAGNOSIS — Z780 Asymptomatic menopausal state: Secondary | ICD-10-CM

## 2022-10-27 DIAGNOSIS — H34812 Central retinal vein occlusion, left eye, with macular edema: Secondary | ICD-10-CM

## 2022-10-27 NOTE — Progress Notes (Signed)
77 y.o. G59P2002 Divorced White or Caucasian female here for new patient appointment.  I saw her back in 2014 due to possible PMP bleeding.  She hasn't had any gyn care since that time.  Denies vaginal bleeding.  H/o liver transplant at [facility].  On prograft.    She does have some memory issues and repeats some information/stories today.  She did see neurology last year 04/29/2022.  She's had some testing for her cognitive changes.     No LMP recorded. Patient is postmenopausal.          Sexually active: No.  H/O STD:  no  Health Maintenance: PCP:  Dr. Sanda Linger.  Last appt was 09/2019.  Did blood work at that appt: yes Colonoscopy:  01/20/2021 MMG:  04/11/2020 BMD:  08/06/2005 Last pap smear:  01/03/2014.      reports that she has never smoked. She has never used smokeless tobacco. She reports that she does not drink alcohol and does not use drugs.  Past Medical History:  Diagnosis Date   Acute cystitis without hematuria 03/12/2022   Allergic rhinitis 11/14/2012   Amblyopia of eye, left 06/17/2017   Asbestos exposure 04/08/2018   Asthma    [id number] due to black mold house, no asthma now   Broken ankle    right   Broken rib    x2   Cataract    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 03/26/2011   Chronic hepatitis C virus infection 05/31/2013   Complication of anesthesia    IV phenergan  serious mental reaction   Delusional thoughts 03/18/2022   Diarrhea 03/13/2022   Essential hypertension 10/14/2017   Gastroesophageal reflux disease 10/14/2017   Generalized anxiety disorder    Hernia, femoral    left   History of blood transfusion 1971   acquired hepatitis c   History of kidney stones    History of liver transplant 11/14/2012   History of nephrolithiasis 10/14/2017   History of renal stone    History of shingles    Hyperglycemia 04/08/2018   Hyperlipidemia with target LDL less than 130 01/15/2021   The 10-year ASCVD risk score (Arnett DK, et  al., 2019) is: 16.2%   Values used to calculate the score:     Age: 35 years     Sex: Female     Is Non-Hispanic African American: No     Diabetic: No     Tobacco smoker: No     Systolic Blood Pressure: 136 mmHg     Is BP treated: No     HDL Cholesterol: 51.2 mg/dL     Total Cholesterol: 202 mg/dL   Intrinsic eczema 54/02/8118   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-will get Avastin injections off/on   Vitamin D deficiency 04/08/2018   Weight loss, non-intentional 03/12/2022    Past Surgical History:  Procedure Laterality  Date   CATARACT EXTRACTION Bilateral    CHOLECYSTECTOMY  1997   CYSTOSCOPY W/ URETERAL STENT PLACEMENT Left 08/14/2016   Procedure: CYSTOSCOPY WITH RETROGRADE PYELOGRAM/URETERAL LEFT URETEROSCOPY AND LEFT STENT PLACEMENT;  Surgeon: Heloise Purpura, MD;  Location: [facility];  Service: Urology;  Laterality: Left;   CYSTOSCOPY/URETEROSCOPY/HOLMIUM LASER/STENT PLACEMENT Left 07/06/2016   Procedure: CYSTOSCOPY/URETEROSCOPY/ BILATERAL RETROGRADE/HOLMIUM LASER/STENT PLACEMENT/BASKET STONE REMOVAL;  Surgeon: Heloise Purpura, MD;  Location: [facility];  Service: Urology;  Laterality: Left;   CYSTOSCOPY/URETEROSCOPY/HOLMIUM LASER/STENT PLACEMENT Left 08/06/2016   Procedure: CYSTOSCOPY/URETEROSCOPY/HOLMIUM LASER/STENT PLACEMENT;  Surgeon: Heloise Purpura, MD;  Location: [facility];  Service: Urology;  Laterality: Left;    CYSTOSCOPY/URETEROSCOPY/HOLMIUM LASER/STENT PLACEMENT Left 08/31/2016   Procedure: CYSTOSCOPY/URETEROSCOPY/HOLMIUM LASER/STENT PLACEMENT/BASKET STONE REMOVAL;  Surgeon: Heloise Purpura, MD;  Location: [facility];  Service: Urology;  Laterality: Left;   FEMORAL HERNIA REPAIR Left 1997   left leg-   LIVER TRANSPLANTATION     NASAL SINUS SURGERY  1997   x3   TONSILLECTOMY  age 25   TUBAL LIGATION      Current Outpatient Medications  Medication Sig Dispense Refill   amLODipine (NORVASC) 5 MG tablet Take 1 tablet (5 mg total) by mouth daily. 90 tablet 0   aspirin EC 81 MG tablet Take 81 mg by mouth daily.     atorvastatin (LIPITOR) 10 MG tablet Take 1 tablet (10 mg total) by mouth daily. 90 tablet 1   Biotin 800 MCG TABS Take 1 tablet by mouth.     CALCIUM-VITAMIN D PO Take 600 mg by mouth daily.     Ferrous Sulfate (IRON) 325 (65 Fe) MG TABS Take 1 tablet by mouth daily.     folic acid (FOLVITE) 400 MCG tablet Take 800 mcg by mouth daily.     gabapentin (NEURONTIN) 300 MG capsule Take 1 capsule (300 mg total) by mouth at bedtime. 30 capsule 2   memantine (NAMENDA) 5 MG tablet Take 1 tablet (5 mg at night) for 2 weeks, then increase to 1 tablet (5 mg) twice a day 60 tablet 11   olmesartan (BENICAR) 20 MG tablet Take 1 tablet (20 mg total) by mouth daily. 90 tablet 0   sertraline (ZOLOFT) 25 MG tablet Take 3 tablets (75 mg total) by mouth daily. 90 tablet 2   tacrolimus (PROGRAF) 0.5 MG capsule 6 by mouth daily NAME BRAND ONLY     No current facility-administered medications for this visit.    Family History  Adopted: Yes  Problem Relation Age of Onset   Stroke Mother    Alzheimer's disease Mother    Cancer Mother    Diabetes Mother        Questionable, per new patient packet    Arthritis Mother        Questionable, per new patient packet    Alcoholism Father    Suicidality Sister    Depression Sister    COPD Sister    COPD Brother        Heavy smoker, per new patient packet    OCD  Daughter    Personality disorder Daughter    Alcoholism Son    Bipolar disorder Granddaughter        Age 44 as of 07/10/2020   Schizophrenia Granddaughter     Review of Systems  Constitutional: Negative.   Genitourinary: Negative.     Exam:   BP 122/66 (BP Location: Right Arm, Patient Position: Sitting, Cuff Size: Normal)   Pulse 61   Ht 5\' 2"  (1.575 m)  Comment: reported  Wt 131 lb (59.4 kg)   BMI 23.96 kg/m   Height: 5\' 2"  (157.5 cm) (reported)  General appearance: alert, cooperative and appears stated age Breasts: normal appearance, no masses or tenderness Abdomen: soft, non-tender; bowel sounds normal; no masses,  no organomegaly Lymph nodes: Cervical, supraclavicular, and axillary nodes normal.  No abnormal inguinal nodes palpated Neurologic: Grossly normal  Pelvic: External genitalia:  no lesions              Urethra:  normal appearing urethra with no masses, tenderness or lesions              Bartholins and Skenes: normal                 Vagina: normal appearing vagina with atrophic changes and no discharge, no lesions              Cervix: no lesions              Pap taken: Yes.   Bimanual Exam:  Uterus:  normal size, contour, position, consistency, mobility, non-tender              Adnexa: normal adnexa and no mass, fullness, tenderness               Rectovaginal: Confirms               Anus:  normal sphincter tone, no lesions  Chaperone, Ina Homes, CMA, was present for exam.  Assessment/Plan: 1. Postmenopausal - Pap smear obtained today per pt request.  Guidelines reviewed.   - Mammogram recommended.  Order placed.  Number given to pt to call. - Colonoscopy 01/20/2021 - Bone mineral density declined - vaccines reviewed/updated  2. Encounter for screening mammogram for malignant neoplasm of breast - MM 3D SCREENING MAMMOGRAM BILATERAL BREAST; Future  3. Cervical cancer screening - Cytology - PAP( [place])  4. History of liver transplant - on Prograft  and followed at [facility]  5. 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 - now with visual impairment  6. Chronic hepatitis C without hepatic coma (HCC)

## 2022-10-27 NOTE — Patient Instructions (Signed)
Call [phone] to schedule an appointment at [facility].    Mammograms can also be self-scheduled online through MyChart.  

## 2022-10-28 NOTE — Unmapped (Signed)
[facility] Refill Coordination Note    Specialty Medication(s) to be Shipped:   Transplant: Prograf 1mg     Other medication(s) to be shipped: No additional medications requested for fill at this time     Kristy Hanson, DOB: Sep 28, 1945  Phone: [phone] (home)       All above HIPAA information was verified with patient.     Was a Nurse, learning disability used for this call? No    Completed refill call assessment today to schedule patient's medication shipment from the [facility] [phone]).  All relevant notes have been reviewed.     Specialty medication(s) and dose(s) confirmed: Regimen is correct and unchanged.   Changes to medications: Kristy Hanson reports no changes at this time.  Changes to insurance: No  New side effects reported not previously addressed with a pharmacist or physician: None reported  Questions for the pharmacist: No    Confirmed patient received a Conservation officer, historic buildings and a Surveyor, mining with first shipment. The patient will receive a drug information handout for each medication shipped and additional FDA Medication Guides as required.       DISEASE/MEDICATION-SPECIFIC INFORMATION        N/A    SPECIALTY MEDICATION ADHERENCE     Medication Adherence    Patient reported X missed doses in the last month: 0  Specialty Medication: PROGRAF 0.5 mg capsule (tacrolimus)  Patient is on additional specialty medications: No              Were doses missed due to medication being on hold? No    Prograf 1 mg: 14 days of medicine on hand       REFERRAL TO PHARMACIST     Referral to the pharmacist: Not needed      [facility]     Shipping address confirmed in Epic.       Delivery Scheduled: Yes, Expected medication delivery date: 11/04/22.     Medication will be delivered via UPS to the prescription address in Epic WAM.    Kristy Hanson   [facility] Specialty Technician

## 2022-10-29 LAB — CYTOLOGY - PAP
Comment: NEGATIVE
Diagnosis: NEGATIVE
High risk HPV: NEGATIVE

## 2022-11-02 DIAGNOSIS — E612 Magnesium deficiency: Principal | ICD-10-CM

## 2022-11-02 DIAGNOSIS — Z944 Liver transplant status: Principal | ICD-10-CM

## 2022-11-02 DIAGNOSIS — Z5181 Encounter for therapeutic drug level monitoring: Principal | ICD-10-CM

## 2022-11-03 DIAGNOSIS — Z944 Liver transplant status: Principal | ICD-10-CM

## 2022-11-03 NOTE — Unmapped (Signed)
Signe Colt 's Prograf shipment will be delayed as a result of a high copay.     I have reached out to the patient  at [phone] and communicated the delivery change. We will reschedule the medication for the delivery date that the patient agreed upon.  We have confirmed the delivery date as 11/17/2022, via ups.     Patient will have new insurance as of 06/01. She says she has enough to last until 06/05. Clinic says pt can take generic, but she is hesistant because she recalls having issues in the past. She'd like to wait until her new insurance is instated and see if the price will be better on the brand Prograf again. Daughter will call me tomorrow to confirm details.

## 2022-11-04 ENCOUNTER — Encounter (HOSPITAL_BASED_OUTPATIENT_CLINIC_OR_DEPARTMENT_OTHER): Payer: Self-pay | Admitting: *Deleted

## 2022-11-04 DIAGNOSIS — R197 Diarrhea, unspecified: Principal | ICD-10-CM

## 2022-11-04 DIAGNOSIS — D849 Immunodeficiency, unspecified: Principal | ICD-10-CM

## 2022-11-04 DIAGNOSIS — Z944 Liver transplant status: Principal | ICD-10-CM

## 2022-11-04 MED ORDER — TACROLIMUS 0.5 MG CAPSULE, IMMEDIATE-RELEASE
ORAL_CAPSULE | Freq: Two times a day (BID) | ORAL | 4 refills | 90 days | Status: CP
Start: 2022-11-04 — End: ?
  Filled 2022-11-20: qty 180, 30d supply, fill #0

## 2022-11-07 ENCOUNTER — Ambulatory Visit (HOSPITAL_BASED_OUTPATIENT_CLINIC_OR_DEPARTMENT_OTHER): Payer: Medicare Other | Admitting: Radiology

## 2022-11-09 DIAGNOSIS — Z5181 Encounter for therapeutic drug level monitoring: Principal | ICD-10-CM

## 2022-11-09 DIAGNOSIS — E612 Magnesium deficiency: Principal | ICD-10-CM

## 2022-11-09 DIAGNOSIS — Z944 Liver transplant status: Principal | ICD-10-CM

## 2022-11-11 ENCOUNTER — Ambulatory Visit (INDEPENDENT_AMBULATORY_CARE_PROVIDER_SITE_OTHER): Payer: Medicare Other | Admitting: Physician Assistant

## 2022-11-11 ENCOUNTER — Encounter: Payer: Self-pay | Admitting: Physician Assistant

## 2022-11-11 VITALS — BP 103/57 | HR 62 | Resp 18 | Ht 62.0 in | Wt 132.0 lb

## 2022-11-11 DIAGNOSIS — F0670 Mild neurocognitive disorder due to known physiological condition without behavioral disturbance: Secondary | ICD-10-CM

## 2022-11-11 DIAGNOSIS — R413 Other amnesia: Secondary | ICD-10-CM

## 2022-11-11 NOTE — Patient Instructions (Signed)
It was a pleasure to see you today at our office. 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 You also have Adjustment disorder which may affect your memory.   Recommendations:  Follow up in 6  months Continue psychotherapy  to help your mood  Continue Memantine 5mg  tablets twice day Increase your activity level  Mediterranean diet  Repeat the neuopsych testing  Whom to call:  Memory  decline, memory medications: Call our office [phone]   For psychiatric meds, mood meds: Please have your primary care physician manage these medications.    If you have any severe symptoms of a stroke, or other severe issues such as confusion,severe chills or fever, etc call 911 or go to the ER as you may need to be evaluated further       RECOMMENDATIONS FOR ALL PATIENTS WITH MEMORY PROBLEMS: 1. Continue to exercise (Recommend 30 minutes of walking everyday, or 3 hours every week) 2. Increase social interactions - continue going to [place] and enjoy social gatherings with friends and family 3. Eat healthy, avoid fried foods and eat more fruits and vegetables 4. Maintain adequate blood pressure, blood sugar, and blood cholesterol level. Reducing the risk of stroke and cardiovascular disease also helps promoting better memory. 5. Avoid stressful situations. Live a simple life and avoid aggravations. Organize your time and prepare for the next day in anticipation. 6. Sleep well, avoid any interruptions of sleep and avoid any distractions in the bedroom that may interfere with adequate sleep quality 7. Avoid sugar, avoid sweets as there is a strong link between excessive sugar intake, diabetes, and cognitive impairment We discussed the Mediterranean diet, which has been shown to help patients reduce the risk of progressive memory disorders and reduces cardiovascular risk. This includes  eating fish, eat fruits and green leafy vegetables, nuts like almonds and hazelnuts, walnuts, and also use olive oil. Avoid fast foods and fried foods as much as possible. Avoid sweets and sugar as sugar use has been linked to worsening of memory function.  There is always a concern of gradual progression of memory problems. If this is the case, then we may need to adjust level of care according to patient needs. Support, both to the patient and caregiver, should then be put into place.    FALL PRECAUTIONS: Be cautious when walking. Scan the area for obstacles that may increase the risk of trips and falls. When getting up in the mornings, sit up at the edge of the bed for a few minutes before getting out of bed. Consider elevating the bed at the head end to avoid drop of blood pressure when getting up. Walk always in a well-lit room (use night lights in the walls). Avoid area rugs or power cords from appliances in the middle of the walkways. Use a walker or a cane if necessary and consider physical therapy for balance exercise. Get your eyesight checked regularly.  FINANCIAL OVERSIGHT: Supervision, especially oversight when making financial decisions or transactions is also recommended.  HOME SAFETY: Consider the safety of the kitchen when operating appliances like stoves, microwave oven, and blender. Consider having supervision and share cooking responsibilities until no longer able to participate in those. Accidents with firearms and other hazards in the house should be identified and addressed as well.   ABILITY TO BE LEFT ALONE: If patient is unable to contact 911 operator, consider using LifeLine, or when  the need is there, arrange for someone to stay with patients. Smoking is a fire hazard, consider supervision or cessation. Risk of wandering should be assessed by caregiver and if detected at any point, supervision and safe proof recommendations should be instituted.  MEDICATION SUPERVISION:  Inability to self-administer medication needs to be constantly addressed. Implement a mechanism to ensure safe administration of the medications.   DRIVING: Regarding driving, in patients with progressive memory problems, driving will be impaired. We advise to have someone else do the driving if trouble finding directions or if minor accidents are reported. Independent driving assessment is available to determine safety of driving.   If you are interested in the driving assessment, you can contact the following:  [employer] in [place] [phone]  [facility] [phone]  [facility] [phone]  [facility] [phone] or [phone]

## 2022-11-11 NOTE — Progress Notes (Signed)
Assessment/Plan:   Mild Cognitive Impairment of Multiple Etiologies  Candace Hill is a very pleasant 77 y.o. 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 Patient is on memantine 5 mg bid, tolerating well. Despite subjective complaints of worsening memory, her MMSE today is stable at 29/30. Discussed with patient the importance of increasing socialization, physical activity and proper diet, as well as the importance of behavioral health for memory improvement. She is interested in driving test and information has been provided.     Recommendations:   Follow up in  6 months. Continue memantine 5 mg twice daily, side effects discussed Repeat neuropsychological evaluation in 12 months for clarity of the diagnosis and disease trajectory Continue B12 supplementation Agree with behavioral health   Recommend good control of cardiovascular risk factors Continue to control mood as per PCP    Subjective:   This patient is accompanied in the office by her daughter  who supplements the history. Previous records as well as any outside records available were reviewed prior to todays visit.   Patient was last seen on  04/29/22     Any changes in memory since last visit?  "Memory may be worse, is decreasing"-she says, with  difficulty remembering recent information, forgetting days, how to charge the phone, etc.She has a cell phone for dementia patients so she is able to communicate in case of an emergency   She does not have much interest in interacting with other people or participating in different functions although this is not new.  She is seeing a psychotherapist.  She does not like doing crossword's or word finding.  Enjoys going to [employer].  repeats oneself?  Endorsed Disoriented when walking into a room?  Patient denies   Leaving objects in unusual places? "Everything is more in order, in place", so she is able to find things easier.   Wandering behavior?   denies ."I stay inside"  Any personality changes since last visit?   denies   Any worsening depression?:  Endorsed. Sees BH which is helpful. Hallucinations or paranoia?  denies   Seizures?   denies    Any sleep changes?  Sleeps well.  Denies vivid dreams, REM behavior or sleepwalking.    Sleep apnea?   denies   Any hygiene concerns?   denies   Independent of bathing and dressing?  Endorsed  Does the patient needs help with medications?  Patient is in charge but she has been "missing the medications ;has a weekly pill box "  Who is in charge of the finances? Patient is in charge     Any changes in appetite?  denies     Patient have trouble swallowing?  denies   Does the patient cook? No  Any headaches?    denies   Vision changes? denies Chronic back pain  denies   Ambulates with difficulty?    Denies. Does not walk frequently.    Recent falls or head injuries?    denies     Unilateral weakness, numbness or tingling?   denies   Any tremors?  denies  Any anosmia?    denies   Any incontinence of urine?  Needs pads, to prevent "accidents". Any bowel dysfunction?  denies      Patient lives at [facility] [address] in [place] Does the patient drive?  She no longer drives because L eye decreased vision and memory. She is interested in having a road test "because not driving it does affect my depression".      Initial Visit Dr. [name] 7/7/21This is a pleasant 77 year old right-handed woman with a history of hypertension, hepatitis C, liver  cancer s/p liver transplant, left retinal vein occlusion, TIA, presenting for evaluation of memory loss. She is verbose and slightly tangential. She saw her PCP in January 2021 reporting that her nurse friend is concerned about her memory. She was told she should be getting more things done and is having trouble with this. She was in the process of moving and was having a hard time, she moved last December. She says  "I've always pushed myself too much." She has noticed memory changes herself, she cannot remember things in a row. 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 She lives alone. She denies getting lost driving. She denies missing medications. She has to juggle things so she could keep up with things in general. MMSE 28/30 in July 2020. She now lives in senior independent living at [facility]. She states she was adopted, and that her mother had Alzheimer's disease. She denies any significant head injuries. No alcohol use.    She is supposed to follow-up in [facility] for her liver transplant, but keeps saying that [facility]. She states "they went me the fake antirejection medication" and Medicare called her about it, she was sent the generic that is cheaper. She reports left retinal vein occlusion as a result of her steroids for her transplant. She has 7% vision on the left. She recalls and ruminates on a fall in February 2020 when she was in the bank and broke her nose and 2 front teeth, saying "someone caused me to have an accident." She states she has not been okay since then. She falls more, feeling dizzy with spinning sensation. She cannot climb steps because she gets very dizzy. She reports an incident last week on the phone, she could not understand anything she was trying to say, gibberish was coming out. This lasted a few hours, the next morning she started to talk. She recalls having a headache at that time. She  denied any focal weakness. She started B12 supplements in March 2021, B12 level was 248.     Neurocognitive testing 9 06/17/2019, Dr. Milbert Coulter results suggested deficits primarily surrounding phonemic fluency and encoding/retrieval aspects of memory. Performance variability was further noted across processing speed, executive functioning, and retention/consolidation aspects of memory. Regarding etiology, the most likely culprit for ongoing cognitive dysfunction is vascular in nature (i.e., mild vascular neurocognitive disorder). There appears to be some confusion regarding Ms. Navejas's stroke history. She was quite adamant that she was told that she has experienced three "mini-strokes" in her lifetime and that these were previously confirmed by a un-named physician. The term "mini-stroke" is problematic due to its imprecision and likely should not be used in clinical capacities as it is unclear if this represents prior TIAs (i.e., temporary blockages that can result in short-term stroke-like symptoms which do not show changes on neuroimaging) or actual very small strokes. A brain MRI  on 01/16/2015 did reveal a small focal lesion in the right genu of the corpus callosum which could represent a very small ischemic infarct. This lesion is visible on her more recent brain MRI (01/21/2020) but is not commented on. Looking at her 2016 MRI, it is also worth pointing out the potential for an additional small left parieto-temporal infarct around the area of the angular gyrus which would be chronic in nature. Overall, while unclear, there does seem to be the potential that Ms. Stockford has experienced at least one very small stroke in the past.       TSH 2.98 09/06/2019          Neuropsychological evaluation, 03/18/2022, Dr. Milbert Coulter briefly, results suggested primary impairments surrounding semantic fluency, receptive language, and all aspects of verbal learning and memory. Additional performance variability was exhibited across  processing speed, executive functioning, phonemic fluency, and visual learning and memory. Relative to her previous evaluation in August 2021, performance declines were exhibited across several domains. This was most prominent across executive functioning, receptive language, and semantic fluency. Declines were also evidence across aspects of memory, particularly delayed retrieval aspects of verbal memory tasks. Other assessed domains generally exhibited stability. The etiology of ongoing dysfunction is unclear as patterns across testing continue to be somewhat nonspecific. Previously, I theorized a primary vascular contribution given medical ailments, microvascular ischemic disease, and prior lacunar infarct. A vascular contribution certainly remains a strong possibility. However, generally speaking, this would not yield progressively worsening cognitive abilities with no reported vascular or other medical event in the interim. The presence of an underlying neurodegenerative condition remains a possibility. Of these illnesses, Alzheimer's disease would be most likely. Across verbal memory tasks, she did not benefit from repeated exposure to information, exhibited prominent difficulties recalling previously learned information after a brief delay, and performed very poorly across yes/no recognition trials. This could suggest rapid forgetting and an evolving storage impairment, which are hallmark signs of this illness. Impairment and progressive decline in semantic fluency would represent typical disease progression. However, visual memory scores remained generally adequate and performance across confrontation naming was also adequate. While concerns are reasonable, she does not exhibit a typical Alzheimer's disease profile across testing at the present time.     Personally reviewed CT of the head 04/23/2022 shows unchanged chronic small vessel ischemic change. Tiny remote lacunar infarcts in the right basal  ganglia and caudate.  Past Medical History:  Diagnosis Date   Acute cystitis without hematuria 03/12/2022   Allergic rhinitis 11/14/2012   Amblyopia of eye, left 06/17/2017   Asbestos exposure 04/08/2018   Asthma    1994-1994 due to black mold house, no asthma now   Broken ankle    right   Broken rib    x2   Cataract    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 03/26/2011   Chronic hepatitis C virus infection 05/31/2013   Complication of anesthesia    IV phenergan  serious mental reaction   Delusional thoughts 03/18/2022   Diarrhea 03/13/2022   Essential hypertension 10/14/2017   Gastroesophageal reflux disease 10/14/2017   Generalized anxiety disorder    Hernia, femoral    left   History of blood transfusion 1971   acquired hepatitis c   History of kidney stones    History of liver transplant 11/14/2012   History of nephrolithiasis 10/14/2017   History of renal stone    History of shingles    Hyperglycemia 04/08/2018   Hyperlipidemia with  target LDL less than 130 01/15/2021   The 10-year ASCVD risk score (Arnett DK, et al., 2019) is: 16.2%   Values used to calculate the score:     Age: 25 years     Sex: Female     Is Non-Hispanic African American: No     Diabetic: No     Tobacco smoker: No     Systolic Blood Pressure: 136 mmHg     Is BP treated: No     HDL Cholesterol: 51.2 mg/dL     Total Cholesterol: 202 mg/dL   Intrinsic eczema 91/47/8295   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   Tailbone injury    broken x3    TIA (transient ischemic attack)    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-will get Avastin injections off/on   Vitamin D deficiency 04/08/2018   Weight loss, non-intentional 03/12/2022     Past Surgical History:  Procedure Laterality Date   CATARACT EXTRACTION Bilateral    CHOLECYSTECTOMY  1997   CYSTOSCOPY W/ URETERAL STENT PLACEMENT Left 08/14/2016   Procedure: CYSTOSCOPY WITH RETROGRADE PYELOGRAM/URETERAL LEFT URETEROSCOPY AND LEFT STENT PLACEMENT;  Surgeon: Heloise Purpura, MD;  Location: [facility];  Service: Urology;  Laterality: Left;   CYSTOSCOPY/URETEROSCOPY/HOLMIUM LASER/STENT PLACEMENT Left 07/06/2016   Procedure: CYSTOSCOPY/URETEROSCOPY/ BILATERAL RETROGRADE/HOLMIUM LASER/STENT PLACEMENT/BASKET STONE REMOVAL;  Surgeon: Heloise Purpura, MD;  Location: [facility];  Service: Urology;  Laterality: Left;   CYSTOSCOPY/URETEROSCOPY/HOLMIUM LASER/STENT PLACEMENT Left 08/06/2016   Procedure: CYSTOSCOPY/URETEROSCOPY/HOLMIUM LASER/STENT PLACEMENT;  Surgeon: Heloise Purpura, MD;  Location: [facility];  Service: Urology;  Laterality: Left;   CYSTOSCOPY/URETEROSCOPY/HOLMIUM LASER/STENT PLACEMENT Left 08/31/2016   Procedure: CYSTOSCOPY/URETEROSCOPY/HOLMIUM LASER/STENT PLACEMENT/BASKET STONE REMOVAL;  Surgeon: Heloise Purpura, MD;  Location: [facility];  Service: Urology;  Laterality: Left;   FEMORAL HERNIA REPAIR Left 1997   left leg-   LIVER TRANSPLANTATION     NASAL SINUS SURGERY  1997   x3   TONSILLECTOMY  age 56   TUBAL LIGATION       PREVIOUS MEDICATIONS:   CURRENT MEDICATIONS:  Outpatient Encounter Medications as of 11/11/2022  Medication Sig   amLODipine (NORVASC) 5 MG tablet Take 1 tablet (5 mg total) by mouth daily.   aspirin EC 81 MG tablet Take 81 mg by mouth daily.   atorvastatin (LIPITOR) 10 MG tablet Take 1 tablet (10 mg total) by mouth daily.   Biotin 800 MCG TABS Take 1 tablet by mouth.   CALCIUM-VITAMIN D PO Take 600 mg by mouth daily.   Ferrous Sulfate (IRON) 325 (65 Fe)  MG TABS Take 1 tablet by mouth daily.   folic acid (FOLVITE) 400 MCG tablet Take 800 mcg by mouth daily.   memantine (NAMENDA) 5 MG tablet Take 1 tablet (5 mg at night) for 2 weeks, then increase to 1 tablet (5 mg) twice a day   olmesartan (BENICAR) 20 MG tablet Take 1 tablet (20 mg total) by mouth daily.   sertraline (ZOLOFT) 25 MG tablet Take 3 tablets (75 mg total) by mouth daily.   tacrolimus (  PROGRAF) 0.5 MG capsule 6 by mouth daily NAME BRAND ONLY   gabapentin (NEURONTIN) 300 MG capsule Take 1 capsule (300 mg total) by mouth at bedtime.   No facility-administered encounter medications on file as of 11/11/2022.     Objective:     PHYSICAL EXAMINATION:    VITALS:   Vitals:   11/11/22 1521  BP: (!) 103/57  Pulse: 62  Resp: 18  SpO2: 98%  Weight: 132 lb (59.9 kg)  Height: 5\' 2"  (1.575 m)    GEN:  The patient appears stated age and is in NAD. HEENT:  Normocephalic, atraumatic.   Neurological examination:  General: NAD, well-groomed, appears stated age. Orientation: The patient is alert. Oriented to person, place and date Cranial nerves: There is good facial symmetry.The speech is fluent and clear. No aphasia or dysarthria. Fund of knowledge is appropriate. Recent memory impaired and remote memory is normal.  Attention and concentration are normal.  Able to name objects and repeat phrases.  Hearing is intact to conversational tone.   Delayed recall 2/3 Sensation: Sensation is intact to light touch throughout Motor: Strength is at least antigravity x4. Tremors: none  DTR's 2/4 in UE/LE      07/29/2021   11:00 AM  Montreal Cognitive Assessment   Visuospatial/ Executive (0/5) 4  Naming (0/3) 3  Attention: Read list of digits (0/2) 2  Attention: Read list of letters (0/1) 1  Attention: Serial 7 subtraction starting at 100 (0/3) 1  Language: Repeat phrase (0/2) 2  Language : Fluency (0/1) 1  Abstraction (0/2) 2  Delayed Recall (0/5) 0  Orientation (0/6) 6  Total 22   Adjusted Score (based on education) 22       11/11/2022    4:00 PM 12/23/2018    9:09 AM 12/20/2017   11:44 AM  MMSE - Mini Mental State Exam  Orientation to time 5 4 4   Orientation to Place 5 4 4   Registration 3 3 3   Attention/ Calculation 5 5 5   Recall 2 3 3   Language- name 2 objects 2 2 2   Language- repeat 1 1 1   Language- follow 3 step command 3 3 3   Language- read & follow direction 1 1 1   Write a sentence 1 1 1   Copy design 1 1 1   Total score 29 28 28        Movement examination: Tone: There is normal tone in the UE/LE Abnormal movements:  no tremor.  No myoclonus.  No asterixis.   Coordination:  There is no decremation with RAM's. Normal finger to nose  Gait and Station: The patient has no difficulty arising out of a deep-seated chair without the use of the hands. The patient's stride length is good.  Gait is cautious and narrow.   Thank you for allowing [place] the opportunity to participate in the care of this nice patient. Please do not hesitate to contact us for any questions or concerns.   Total time spent on today's visit was 33 minutes dedicated to this patient today, preparing to see patient, examining the patient, ordering tests and/or medications and counseling the patient, documenting clinical information in the EHR or other health record, independently interpreting results and communicating results to the patient/family, discussing treatment and goals, answering patient's questions and coordinating care.  Cc:  Etta Grandchild, MD  Marlowe Kays 11/11/2022 4:34 PM

## 2022-11-17 NOTE — Unmapped (Signed)
Pt lvm late yesterday wanting to reconfirm annual appt date - June 6th or 13th.    She called back today and this tpa told her that appt is June 13th. Reminded pt that it had been rescheduled from June 6th since provider's husband was having surgery that week. Told her this tpa could send [employer] letter with details, but pt said delivery of any sort is slow where she lives and asked if this tpa would just give her address. Her daughter is bringing her and said address is all she needs. Shared the address and floor as well as which [place] [address] to [place], and patient was writing it all down.   Re labs - suggested to pt that she discuss with provider (and primary coord in phone call before appt) her recent inability to be stuck for lab draws and talk to them about how to proceed. She verbalized understanding of all discussed.

## 2022-11-18 ENCOUNTER — Ambulatory Visit (INDEPENDENT_AMBULATORY_CARE_PROVIDER_SITE_OTHER): Payer: Medicare HMO | Admitting: Licensed Clinical Social Worker

## 2022-11-18 DIAGNOSIS — F331 Major depressive disorder, recurrent, moderate: Secondary | ICD-10-CM | POA: Diagnosis not present

## 2022-11-18 DIAGNOSIS — F411 Generalized anxiety disorder: Secondary | ICD-10-CM | POA: Diagnosis not present

## 2022-11-18 NOTE — Progress Notes (Signed)
  THERAPIST PROGRESS NOTE  Session Time: 2-3p  Behavioral Health Outpatient [facility] in office visit for patient and LCSW clinician  Participation Level: Active  Behavioral Response: Neat and Well GroomedAlert and ConfusedAnxious and Depressed  Type of Therapy: Individual Therapy  Treatment Goals addressed: Learn and implement coping skills that result in a reduction of anxiety and worry, and improve daily functioning per pt report 3 out of 5 sessions documented   Reduce overall frequency, intensity, and duration of the anxiety so that daily functioning is not impaired per pt self report 3 out of 5 sessions documented   ProgressTowards Goals: Progressing  Interventions: Solution Focused, Supportive, and Other: trauma focused  Summary: Candace Hill is a 76 y.o. female who presents with symptoms consistent with anxiety disorder.  Patient reports that she is compliant with her Zoloft, and is compliant with her psychiatric appointments.  Patient reports good quality and quantity of sleep.  Explored current family-based issues/concerns. Pt reports that one of the main anxiety triggers related to family relationships is continuing stress about the potential need to move in with daughter. Pt reports that she feels her daughter is pressuring her to take out life insurance policies and move in with her so that she can help (daughter) financially. Discussed current ways that pt and family members are communicating, and discussed  any possible changes. Pt states that she signed a new lease for a year so she knows that she will live independently until then. Allowed pt to explore family members (daughter, granddaughter, grandson) and assisted pt with identifying behavior patterns in self and others.  Discussed current conflict resolution and problem solving behaviors and reviewed changes that could improve future conflict/problem solving with family members. Pt reflects understanding and is  cooperative.   Pt reports that driving remains a concern--pt is fearful that her license will be revoked since more than one doctor has recommended that she not drive. Pt states that one doctor recently recommended pt attend a group specifically for older individuals that have had driving restrictions and to see if pt would qualify for services. Pt states she did call the number and is going through the eligibility process.   Continued recommendations are as follows: self care behaviors, positive social engagements, focusing on overall work/home/life balance, and focusing on positive physical and emotional wellness.   Suicidal/Homicidal: No  Therapist Response: Pt is continuing to apply interventions learned in session into daily life situations. Pt is currently on track to meet goals utilizing interventions mentioned above. Personal growth and progress noted. Treatment to continue as indicated.   Patient is demonstrating continued development in overall self-awareness, and improvements and life management behaviors.  Patient is demonstrating that she is able to identify concerns and brainstorm multiple solutions.  Plan: Return again in 4 weeks.  Diagnosis:  No diagnosis found.  Collaboration of Care: Other Pt encouraged to continue care with psychiatrist of record, Dr. Eliseo Gum  Patient/Guardian was advised Release of Information must be obtained prior to any record release in order to collaborate their care with an outside provider. Patient/Guardian was advised if they have not already done so to contact the registration department to sign all necessary forms in order for [place] to release information regarding their care.   Consent: Patient/Guardian gives verbal consent for treatment and assignment of benefits for services provided during this visit. Patient/Guardian expressed understanding and agreed to proceed.   Ernest Haber Dhairya Corales, LCSW 11/18/2022

## 2022-11-19 NOTE — Unmapped (Signed)
Pt called to see if this tpa knew how to reach [facility] in pharmacy services who called her yesterday when she was out. Found shared services number in pt's chart and gave it to pt, who wrote it down. Also gave her phone number of primary coord bc she said this tpa's phone number was the only one she had for [facility]. She was appreciative and verbalized understanding.

## 2022-11-20 NOTE — Unmapped (Signed)
[facility] Clinical Intervention    Type of intervention: Narrow therapeutic index drug    Medication involved: prograf/tacrolimus    Problem identified: patient has changed insurance, brand is high copay, pt and clinic prefer generic    Intervention performed: patient aware of and ok with generic, knows to let clinic know on date of switch as bloodwork may be needed    Follow-up needed: clinic aware    Approximate time spent: 5-10 minutes    Clinical evidence used to support intervention: FDA Orange Book    Result of the intervention: Prevention of an adverse drug event    Thad Ranger, PharmD   [facility] Specialty Pharmacist

## 2022-11-24 NOTE — Unmapped (Signed)
Received vm from late yesterday from pt's daughter who wanted to confirm address of pt's 6/13 annual appt at [facility]. She said her mother has had some memory issues and left call-back number.    This morning left detailed vm on daughter's phone with that information including which exit [place] [address].    Later received call from pt asking same pt. Explained that her daughter had called and this tpa left vm for her with info. Pt asked for address so she could put it on her calendar. Gave her address and she was thankful for info and apologized for calling so many times in the last couple of weeks. Told her that was not a problem and she verbalized understanding.

## 2022-11-25 ENCOUNTER — Ambulatory Visit (HOSPITAL_BASED_OUTPATIENT_CLINIC_OR_DEPARTMENT_OTHER): Payer: Medicare HMO | Admitting: Student in an Organized Health Care Education/Training Program

## 2022-11-25 ENCOUNTER — Encounter (HOSPITAL_COMMUNITY): Payer: Self-pay | Admitting: Student in an Organized Health Care Education/Training Program

## 2022-11-25 DIAGNOSIS — F331 Major depressive disorder, recurrent, moderate: Secondary | ICD-10-CM

## 2022-11-25 MED ORDER — SERTRALINE HCL 100 MG PO TABS: 100.0000 mg | ORAL_TABLET | Freq: Every day | ORAL | 2 refills | Status: DC
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
## 2022-11-26 ENCOUNTER — Ambulatory Visit: Admit: 2022-11-26 | Discharge: 2022-11-27 | Payer: MEDICARE

## 2022-11-26 DIAGNOSIS — Z944 Liver transplant status: Secondary | ICD-10-CM | POA: Diagnosis not present

## 2022-11-26 DIAGNOSIS — Z5181 Encounter for therapeutic drug level monitoring: Secondary | ICD-10-CM | POA: Diagnosis not present

## 2022-11-26 DIAGNOSIS — E612 Magnesium deficiency: Secondary | ICD-10-CM | POA: Diagnosis not present

## 2022-11-26 LAB — COMPREHENSIVE METABOLIC PANEL
ALBUMIN: 4.1 g/dL (ref 3.4–5.0)
ALKALINE PHOSPHATASE: 65 U/L (ref 46–116)
ALT (SGPT): <7 U/L — ABNORMAL LOW (ref 10–49)
ANION GAP: 8 mmol/L (ref 5–14)
AST (SGOT): 15 U/L (ref <=34)
BILIRUBIN TOTAL: 0.5 mg/dL (ref 0.3–1.2)
BLOOD UREA NITROGEN: 20 mg/dL (ref 9–23)
BUN / CREAT RATIO: 18
CALCIUM: 9.4 mg/dL (ref 8.7–10.4)
CHLORIDE: 103 mmol/L (ref 98–107)
CO2: 27.2 mmol/L (ref 20.0–31.0)
CREATININE: 1.12 mg/dL — ABNORMAL HIGH
EGFR CKD-EPI (2021) FEMALE: 51 mL/min/{1.73_m2} — ABNORMAL LOW (ref >=60)
GLUCOSE RANDOM: 95 mg/dL (ref 70–179)
POTASSIUM: 4.1 mmol/L (ref 3.4–4.8)
PROTEIN TOTAL: 6.9 g/dL (ref 5.7–8.2)
SODIUM: 138 mmol/L (ref 135–145)

## 2022-11-26 LAB — CBC W/ AUTO DIFF
BASOPHILS ABSOLUTE COUNT: 0.1 10*9/L (ref 0.0–0.1)
BASOPHILS RELATIVE PERCENT: 0.6 %
EOSINOPHILS ABSOLUTE COUNT: 0.1 10*9/L (ref 0.0–0.5)
EOSINOPHILS RELATIVE PERCENT: 1.5 %
HEMATOCRIT: 36.8 % (ref 34.0–44.0)
HEMOGLOBIN: 12.8 g/dL (ref 11.3–14.9)
LYMPHOCYTES ABSOLUTE COUNT: 1.5 10*9/L (ref 1.1–3.6)
LYMPHOCYTES RELATIVE PERCENT: 16.8 %
MEAN CORPUSCULAR HEMOGLOBIN CONC: 34.7 g/dL (ref 32.0–36.0)
MEAN CORPUSCULAR HEMOGLOBIN: 31 pg (ref 25.9–32.4)
MEAN CORPUSCULAR VOLUME: 89.4 fL (ref 77.6–95.7)
MEAN PLATELET VOLUME: 8.5 fL (ref 6.8–10.7)
MONOCYTES ABSOLUTE COUNT: 0.6 10*9/L (ref 0.3–0.8)
MONOCYTES RELATIVE PERCENT: 6.5 %
NEUTROPHILS ABSOLUTE COUNT: 6.6 10*9/L (ref 1.8–7.8)
NEUTROPHILS RELATIVE PERCENT: 74.6 %
PLATELET COUNT: 188 10*9/L (ref 150–450)
RED BLOOD CELL COUNT: 4.12 10*12/L (ref 3.95–5.13)
RED CELL DISTRIBUTION WIDTH: 13.1 % (ref 12.2–15.2)
WBC ADJUSTED: 8.9 10*9/L (ref 3.6–11.2)

## 2022-11-26 LAB — BILIRUBIN, DIRECT: BILIRUBIN DIRECT: 0.2 mg/dL (ref 0.00–0.30)

## 2022-11-26 LAB — MAGNESIUM: MAGNESIUM: 1.9 mg/dL (ref 1.6–2.6)

## 2022-11-26 LAB — PHOSPHORUS: PHOSPHORUS: 3.6 mg/dL (ref 2.4–5.1)

## 2022-11-26 LAB — GAMMA GT: GAMMA GLUTAMYL TRANSFERASE: 28 U/L

## 2022-11-26 NOTE — Unmapped (Unsigned)
[facility], [facility], [address]  [place], [place]  [address]  Ph: [phone]  Fax: [phone]    August 20, 2021 10:25 AM      Patient Care Team:  Pcp, None Per Patient as PCP - General  Remigio Eisenmenger Stevenson Clinch, RN as Transplant Coordinator (Transplant)  Dr. Terrace Arabia as Attending Provider (Neurology)  Rolly Salter, MD as Attending Provider (Urology)  Bennye Alm, MD as Surgeon  Christena Deem Cyril Loosen, CPP as Pharmacist (Pharmacist)    RE: Kristy Hanson; DOB: 12-19-1945      Reason for visit: Follow-up status post OLT    HPI: Ms. 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 She was last seen in hepatology clinic pre-pandemic and had Video visit with me 15 months ago.  She has had no episodes of rejection or biliary strictures. Her last biopsy was on 01/27/2016 which showed NASH stage II disease. She denies any history of hypertension or diabetes. In regards to her immunosuppression she is currently on Prograf 1.5 mg twice a day. Trough level between 3-5. In regards to her chronic hepatitis C she was noted be genotype 1B and completed 12 weeks of Harvoni and achieved SVR. She is currently being followed by psychiatry and a counselor locally for depression, also seeing a Engineer, agricultural. She does acknowledges increased stresses at home. She is currently on namenda.  Has a splenic artery aneurysm  Today in clinic she denies any fever, chills, headache, jaundice, chest pain, upper lower GI abdomen, melena or confusion.        PMH:  Patient Active Problem List   Diagnosis    Chronic hepatitis C virus infection (CMS-HCC)     Past Medical History:   Diagnosis Date    Blindness     left eye    Chronic pruritic rash in adult     Depression     Hepatitis C     Gemp 1b; SVR post-transplant 2015    HTN (hypertension)     Liver transplanted (CMS-HCC) 02-17-2008    Nephrolithiasis        PSH:  Past Surgical History:   Procedure Laterality Date    LIVER TRANSPLANTATION  02-17-08    TUBAL LIGATION Bilateral        MEDICATIONS:  Current Outpatient Medications   Medication Sig Dispense Refill    amlodipine (NORVASC) 2.5 MG tablet Take 1 tablet (2.5 mg total) by mouth daily.      aspirin (ECOTRIN) 81 MG tablet Take 81 mg by mouth daily.      atorvastatin (LIPITOR) 10 MG tablet Take 1 tablet (10 mg total) by mouth daily.      biotin 1,000 mcg Chew Chew 1,000 mcg  in the morning.      ferrous sulfate 325 (65 FE) MG tablet Take 325 mg by mouth daily with breakfast.      memantine (NAMENDA) 5 MG tablet Take 1 tablet (5 mg total) by mouth two (2) times a day.      psyllium (METAMUCIL) 3.4 gram packet Take 1 packet by mouth daily as needed (constipation).      sertraline (ZOLOFT) 100 MG tablet Take 1 tablet (100 mg total) by mouth daily.      tacrolimus (PROGRAF) 0.5 MG capsule Take 3 capsules (1.5 mg total) by mouth two (2) times a day. 540 capsule 4  No current facility-administered medications for this visit.       ALLERGIES:  Avastin [bevacizumab], Prednisone, Amoxicillin-pot clavulanate, Codeine, Penicillins, Procaine, Promethazine, Sulfa (sulfonamide antibiotics), Lidocaine, and Tamsulosin      SH: Patient lives [place] perhaps moving in with her daughter soon. She is divorced since 2000. She has 1 daughter.        LABS:        IMAGING  CLINICAL DATA:  77 year old female with history of splenic artery   aneurysm. History of prior liver transplant.     EXAM:   CTA ABDOMEN AND PELVIS WITHOUT AND WITH CONTRAST     TECHNIQUE:   Multidetector CT imaging of the abdomen and pelvis was performed   using the standard protocol during bolus administration of   intravenous contrast. Multiplanar reconstructed images and MIPs were   obtained and reviewed to evaluate the vascular anatomy.     RADIATION DOSE REDUCTION: This exam was performed according to the   departmental dose-optimization program which includes automated   exposure control, adjustment of the mA and/or kV according to   patient size and/or use of iterative reconstruction technique.     CONTRAST:  OMNIPAQUE IOHEXOL 350 MG/ML SOLN     COMPARISON:  07/09/2005     FINDINGS:   VASCULAR     Aorta: Minimal atherosclerotic changes of the distal thoracic aorta.   Diameter at the hiatus measures 18 mm.     No ulcerated plaque or pedunculated plaque. No wall thickening or   periaortic fluid/inflammation. No aneurysm.     Celiac: Celiac arteries patent without significant atherosclerotic   changes at the origin.     Surgical changes adjacent to the hepatic artery compatible with   prior transplant.     Aneurysm of the distal splenic artery is essentially unchanged in   size dating to the CT of 07/09/2005. Greatest diameter on the remote   CT 12 mm. Greatest diameter in similar axial plane on the current   CT, 12 mm. Similar appearance of near circumferential rim   calcification.     Additionally, there is a smaller partially rim calcified aneurysm on   a more distal arterial branch measuring 7 mm. While this was not   described on the baseline CT of 07/09/2005, it does appear to have   been present and unchanged diameter of 7 mm. Additional small nearly   completely calcified aneurysm at the hilum of the spleen, unchanged   from 2007, 6 mm     SMA: Patent, with no significant atherosclerotic changes.     Renals:     - Right: Main right renal artery patent without significant   atherosclerosis. Small accessory right renal artery to the lower   pole cortex.     - Left: Left renal artery patent.     IMA: Inferior mesenteric artery is patent.     Right lower extremity:     Unremarkable course, caliber, and contour of the right iliac system.   No aneurysm, dissection, or occlusion. No significant   atherosclerotic changes. Hypogastric artery is patent. Common   femoral artery patent. Proximal SFA and profunda femoris patent.     Left lower extremity:     Unremarkable course, caliber, and contour of the left iliac system.   No aneurysm, dissection, or occlusion. No significant   atherosclerotic changes. Hypogastric artery is patent. Common   femoral artery patent. Proximal SFA and profunda femoris patent.     Veins: Unremarkable appearance of  the venous system.     Review of the MIP images confirms the above findings.     NON-VASCULAR     Lower chest: No acute.     Hepatobiliary: Unremarkable appearance of the liver. Unremarkable   gall bladder.     Pancreas: Unremarkable.     Spleen: Since the baseline CT 07/09/2005, there has been interval   decreased size of the spleen, now measuring less than 10 cm on axial     Adrenals/Urinary Tract:     - Right adrenal gland: Unremarkable     - Left adrenal gland: Unremarkable.     - Right kidney: No hydronephrosis, nephrolithiasis, inflammation, or   ureteral dilation. Cyst on the lower pole collecting system, 21 mm.     - Left Kidney: No hydronephrosis, nephrolithiasis, inflammation, or   ureteral dilation. No focal lesion.     - Urinary Bladder: Urinary bladder relatively decompressed.     Stomach/Bowel:     - Stomach: Unremarkable.     - Small bowel: Unremarkable     - Appendix: Normal.     - Colon: Unremarkable.     Lymphatic: No adenopathy.     Mesenteric: No free fluid or air. No mesenteric adenopathy.     Reproductive: Unremarkable uterus/adnexa     Other: Surgical changes of the midline abdomen.     Musculoskeletal: No bony canal narrowing. Mild degenerative changes   of the visualized thoracolumbar spine.     IMPRESSION:   Redemonstration of splenic artery aneurysm in the hilum of the   spleen, 12 mm, which is essentially unchanged in size and   configuration dating to the CT of 07/09/2005. Additionally, there   are 2 smaller aneurysms of more distal splenic branch arteries,   again relatively unchanged in this time interval.     Surgical changes of liver transplant.     Aortic Atherosclerosis (ICD10-I70.0).     Ancillary findings as above. Signed,     Yvone Neu. Reyne Dumas, RPVI     [facility]     [facility]       Electronically Signed     By: Gilmer Mor D.O.     On: 08/04/2021 15:27          ASSESSMENT: Ms. 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  She has had no episodes of rejection or biliary strictures. Her last biopsy was on 01/27/2016 which showed NASH stage II disease. She denies any history of hypertension or diabetes. In regards to her immunosuppression she is currently on Prograf 1.5 mg twice a day. Trough level between 3-5. In regards to her chronic hepatitis C she was noted be genotype 1B and completed 12 weeks of Harvoni and achieved SVR. She is currently being followed by psychiatry and a counselor locally for depression and memory issues. She does acknowledges increased stresses at home. She is currently on Cymbalta 60 mg daily.     I do have some concern about her medication compliance, she's in an assisted living center, but is responsible for her own medications.     PLAN:  1. This patient was reviewed with Dr Ruffin Frederick  2. Labs reviewed   3. No Med changes at this time  4. RTC in 1 years        Earley Abide, NP  [facility]  [facility] [address]  [place], [place]  [address]  Ph: [phone]  Fax: [phone]

## 2022-11-26 NOTE — Unmapped (Addendum)
Blood work:  repeat in a couple weeks, then every 3 months thereafter.    Fecal incontinence   Kegel exercises 10 times, several times a day,    Take 2-3 Metamucil daily to help bulk the stool.     Food:   Increase your protein intake  Eggs, chicken, fish, yogurt

## 2022-11-26 NOTE — Unmapped (Signed)
Contacted patient in preparation for her annual liver-txp follow up. Patient reported fairly recent loss of sight to left eye, thought to be caused from txp steroid use. Noted ED visit to [facility] in late March for hypertension. Patient reports having memory loss and says she has upcoming test scheduled to monitor progression. She says she has been very depressed, with her vision loss being a major factor, and has had ~40lb weight loss recent years. Patient states she is in counseling and was seen by psychiatry yesterday [facility]), who increased her zoloft dose. She denies any issues with n/v/d/constipation/hematochezia/dysuria/sob/chronic pain or issues with hernias. She admits she does not hydrate well, so encouraged guideline of 60-80 oz of caffeine-free fluid daily. She does not remember year of her last dermatology f/up. Encouraged her to discuss referral with her pcp, Dr.Tom Jones, as well as bone density monitoring. Documentation found on colonoscopy done locally in Aug'22, which was clear, so she should not be due for further studies if she remains without problems. Patient cited a list of medications/dosages she is currently taking, with several changes from our previous record. It appears from her immunization record that she has had some Covid vaccines and pna vaccines. Reinforced the importance of staying current with flu/covid and obtaining rsv vaccine. It does not appear she has had the shingrix series, and patient has hx of having had shingles. Patient has not kept up to date with quarterly labs and no tac level obtained since January. Reinforced the importance of continuing efforts to complete routine labs, despite some issues with difficult blood draws in the past. Patient verbalized understanding of all discussed and took some notes for self-reminders. Sent message to hepatology provider with updates/concerns for this afternoon's clinic visit.

## 2022-11-27 LAB — TACROLIMUS LEVEL: TACROLIMUS BLOOD: 5.6 ng/mL

## 2022-12-09 ENCOUNTER — Ambulatory Visit (INDEPENDENT_AMBULATORY_CARE_PROVIDER_SITE_OTHER): Payer: Medicare HMO | Admitting: Emergency Medicine

## 2022-12-09 ENCOUNTER — Encounter: Payer: Self-pay | Admitting: Emergency Medicine

## 2022-12-09 VITALS — BP 94/56 | HR 66 | Temp 97.9°F | Ht 62.0 in | Wt 132.4 lb

## 2022-12-09 DIAGNOSIS — R42 Dizziness and giddiness: Secondary | ICD-10-CM | POA: Insufficient documentation

## 2022-12-09 DIAGNOSIS — F411 Generalized anxiety disorder: Secondary | ICD-10-CM | POA: Diagnosis not present

## 2022-12-09 DIAGNOSIS — N1832 Chronic kidney disease, stage 3b: Secondary | ICD-10-CM

## 2022-12-09 DIAGNOSIS — Z944 Liver transplant status: Secondary | ICD-10-CM

## 2022-12-09 DIAGNOSIS — I10 Essential (primary) hypertension: Secondary | ICD-10-CM

## 2022-12-09 DIAGNOSIS — F01B4 Vascular dementia, moderate, with anxiety: Secondary | ICD-10-CM

## 2022-12-09 DIAGNOSIS — I952 Hypotension due to drugs: Secondary | ICD-10-CM | POA: Diagnosis not present

## 2022-12-09 NOTE — Assessment & Plan Note (Signed)
Stable.  Takes Namenda 5 mg daily Also taking sertraline 100 mg daily

## 2022-12-09 NOTE — Assessment & Plan Note (Addendum)
Recently started on olmesartan and amlodipine dose was increased Lightheadedness secondary to hypotension Advised to increased fluid intake as well as salt intake Recommend to stop blood pressure medications for at least 48 hours Monitor blood pressure readings at home.  If elevated, resume blood pressure medication. When ready to resume blood pressure medications recommend to start amlodipine 5 mg daily and stop olmesartan Advised to contact the office if numbers persistently abnormal over the next couple days and follow-up with PCP in 2 to 3 weeks

## 2022-12-09 NOTE — Assessment & Plan Note (Signed)
Last GFR 51 Advised to stay well-hydrated and avoid NSAIDs

## 2022-12-09 NOTE — Assessment & Plan Note (Addendum)
Stable.  Normal liver enzymes in recent CMP

## 2022-12-09 NOTE — Progress Notes (Signed)
Candace Hill 77 y.o.   Chief Complaint  Patient presents with   Dizziness    Pt symptoms of dizziness has gotten worse, not sure what is the cause of it due to her having history of dizziness already. Been two weeks since it started, after she was put on a new BP med    HISTORY OF PRESENT ILLNESS: This is a 77 y.o. female complaining of feeling lightheaded and dizzy for the last couple weeks. Was recently started on second blood pressure medication History of hypertension History of liver transplant Recent blood work results from 11/26/2022 reviewed with patient Normal CBC.  Normal electrolytes.  Chronic kidney disease with GFR of 51 Normal magnesium, normal phosphorus  BP Readings from Last 3 Encounters:  12/09/22 (!) 94/56  11/11/22 (!) 103/57  10/27/22 122/66   Assessment & Plan:      Essential hypertension-- Her blood pressure is not adequately well-controlled.  Will increase the dose of amlodipine and add an ARB. -     amLODIPine Besylate; Take 1 tablet (5 mg total) by mouth daily.  Dispense: 90 tablet; Refill: 0 -     Olmesartan Medoxomil; Take 1 tablet (20 mg total) by mouth daily.  Dispense: 90 tablet; Refill: 0   Stage 3b chronic kidney disease- Her renal function is stable.   Moderate vascular dementia with anxiety- Will try to get better control of her blood pressure.       Follow-up: Return in about 3 months (around 12/24/2022).   Sanda Linger, MD  Dizziness Pertinent negatives include no abdominal pain, chest pain, chills, congestion, coughing, fever, nausea, rash, sore throat or vomiting.     Prior to Admission medications   Medication Sig Start Date End Date Taking? Authorizing Provider  amLODipine (NORVASC) 5 MG tablet Take 1 tablet (5 mg total) by mouth daily. 09/24/22  Yes Etta Grandchild, MD  aspirin EC 81 MG tablet Take 81 mg by mouth daily.   Yes [provider]  atorvastatin (LIPITOR) 10 MG tablet Take 1 tablet (10 mg total) by mouth  daily. 03/13/22  Yes Etta Grandchild, MD  Biotin 800 MCG TABS Take 1 tablet by mouth.   Yes [provider]  CALCIUM-VITAMIN D PO Take 600 mg by mouth daily.   Yes [provider]  Ferrous Sulfate (IRON) 325 (65 Fe) MG TABS Take 1 tablet by mouth daily.   Yes [provider]  folic acid (FOLVITE) 400 MCG tablet Take 800 mcg by mouth daily.   Yes [provider]  memantine (NAMENDA) 5 MG tablet Take 1 tablet (5 mg at night) for 2 weeks, then increase to 1 tablet (5 mg) twice a day 04/29/22  Yes Wertman, Sung Amabile, PA-C  olmesartan (BENICAR) 20 MG tablet Take 1 tablet (20 mg total) by mouth daily. 09/24/22  Yes Etta Grandchild, MD  sertraline (ZOLOFT) 100 MG tablet Take 1 tablet (100 mg total) by mouth daily. 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     Penicillins Nausea Only and Other (See Comments)    Has patient had a PCN reaction causing immediate rash, facial/tongue/throat swelling, SOB or lightheadedness with hypotension: No Has patient had a PCN reaction causing severe  rash involving mucus membranes or skin necrosis: No Has patient had a PCN reaction that required hospitalization No Has patient had a PCN reaction occurring within the last 10 years: No If all of the above answers are "NO", then may proceed with Cephalosporin use.   Phenergan [Promethazine] Other (See Comments)    Reaction:  Hallucinations  Pt states that she is only allergic to IV form.    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   Lacunar infarction 03/18/2022   Generalized anxiety disorder    Intrinsic eczema 01/15/2021   Hyperlipidemia with target LDL less than 130 01/15/2021   Stage 3b chronic kidney disease 01/15/2021   Hyperglycemia 04/08/2018   High risk medication use 04/08/2018   Vitamin D deficiency 04/08/2018   Essential hypertension 10/14/2017   Gastroesophageal reflux disease 10/14/2017   Migraine 07/02/2015   Overweight (BMI 25.0-29.9) 06/28/2015   Chronic hepatitis C virus infection 05/31/2013   Major depressive disorder 11/14/2012   Allergic rhinitis 11/14/2012   History of liver transplant 11/14/2012   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 03/26/2011   PCO (posterior capsular opacification), right 03/26/2011    Past Medical History:  Diagnosis Date   Acute cystitis without hematuria 03/12/2022   Allergic rhinitis 11/14/2012   Amblyopia of eye, left 06/17/2017   Asbestos exposure 04/08/2018   Asthma    [id number] due to black mold house, no asthma now   Broken ankle    right   Broken rib    x2   Cataract    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 03/26/2011   Chronic hepatitis C virus infection 05/31/2013   Complication of anesthesia    IV phenergan  serious mental reaction   Delusional thoughts 03/18/2022   Diarrhea 03/13/2022   Essential hypertension 10/14/2017   Gastroesophageal reflux disease 10/14/2017   Generalized anxiety disorder    Hernia, femoral    left   History of blood transfusion 1971   acquired hepatitis c   History of kidney stones    History of liver transplant 11/14/2012   History of nephrolithiasis 10/14/2017   History of renal stone    History of shingles    Hyperglycemia 04/08/2018   Hyperlipidemia with target LDL less than 130 01/15/2021   The 10-year ASCVD risk score (Arnett DK, et al., 2019) is: 16.2%   Values used to calculate the score:     Age: 77 years     Sex: Female      Is Non-Hispanic African American: No     Diabetic: No     Tobacco smoker: No     Systolic Blood Pressure: 136 mmHg     Is BP treated: No     HDL Cholesterol: 51.2 mg/dL     Total Cholesterol: 202 mg/dL   Intrinsic eczema 47/82/9562   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-will get Avastin injections off/on   Vitamin D deficiency 04/08/2018   Weight loss, non-intentional 03/12/2022    Past Surgical History:  Procedure Laterality Date   CATARACT EXTRACTION Bilateral    CHOLECYSTECTOMY  1997   CYSTOSCOPY W/ URETERAL STENT PLACEMENT Left 08/14/2016   Procedure: CYSTOSCOPY WITH RETROGRADE PYELOGRAM/URETERAL LEFT URETEROSCOPY AND LEFT STENT PLACEMENT;  Surgeon: Heloise Purpura, MD;  Location: [facility];  Service: Urology;  Laterality: Left;   CYSTOSCOPY/URETEROSCOPY/HOLMIUM LASER/STENT PLACEMENT Left 07/06/2016   Procedure: CYSTOSCOPY/URETEROSCOPY/ BILATERAL RETROGRADE/HOLMIUM LASER/STENT PLACEMENT/BASKET STONE REMOVAL;  Surgeon: Heloise Purpura, MD;  Location: [facility];  Service: Urology;  Laterality: Left;   CYSTOSCOPY/URETEROSCOPY/HOLMIUM LASER/STENT PLACEMENT Left 08/06/2016   Procedure: CYSTOSCOPY/URETEROSCOPY/HOLMIUM LASER/STENT PLACEMENT;  Surgeon: Heloise Purpura, MD;  Location: [facility];  Service: Urology;  Laterality: Left;   CYSTOSCOPY/URETEROSCOPY/HOLMIUM LASER/STENT PLACEMENT Left 08/31/2016   Procedure:  CYSTOSCOPY/URETEROSCOPY/HOLMIUM LASER/STENT PLACEMENT/BASKET STONE REMOVAL;  Surgeon: Heloise Purpura, MD;  Location: [facility];  Service: Urology;  Laterality: Left;   FEMORAL HERNIA REPAIR Left 1997   left leg-   LIVER TRANSPLANTATION     NASAL SINUS SURGERY  1997   x3   TONSILLECTOMY  age 24   TUBAL LIGATION      Social History   Socioeconomic History   Marital status: Divorced    Spouse name: Not on file   Number of children: 2   Years of education: 14   Highest education level: Associate degree: occupational, Scientist, product/process development, or vocational program  Occupational History   Occupation: Disabled  Tobacco Use   Smoking status: Never   Smokeless tobacco: Never  Vaping Use   Vaping Use: Never used  Substance and Sexual Activity   Alcohol use: No   Drug use: No   Sexual activity: Never    Comment: 14 years ago  Other Topics Concern   Not on file  Social History Narrative   Lives at alone alone.   Right-handed.   No caffeine use.      PSC- as of 10/12/17   Diet: No Fried Foods       Caffeine: Summer time, sweet tea       Married, if yes what year: Divorced       Do you live in a house, apartment, assisted living, condo, trailer, ect: [place] [facility], 1 stories, and 1 person       Pets: 1 dog       Current/Past profession: Dentist       Exercise: Some, walking dog       Living Will: yes   DNR: yes   POA/HPOA: yes      Functional Status: Patient did not answer   Do you have difficulty bathing or dressing yourself?   Do you have difficulty preparing food or eating?   Do you have difficulty managing your medications?   Do you have difficulty managing your finances?   Do you have difficulty affording your medications?   Social Determinants of Health   Financial Resource Strain: Low Risk  (03/10/2021)   Overall Financial Resource Strain (CARDIA)    Difficulty of Paying Living Expenses: Not very hard  Food Insecurity: No Food Insecurity (03/10/2021)   Hunger Vital Sign     Worried About Running Out of Food  in the Last Year: Never true    Ran Out of Food in the Last Year: Never true  Transportation Needs: No Transportation Needs (03/10/2021)   PRAPARE - Administrator, Civil Service (Medical): No    Lack of Transportation (Non-Medical): No  Physical Activity: Insufficiently Active (03/10/2021)   Exercise Vital Sign    Days of Exercise per Week: 1 day    Minutes of Exercise per Session: 10 min  Stress: Stress Concern Present (03/10/2021)   [employer] of Occupational Health - Occupational Stress Questionnaire    Feeling of Stress : Very much  Social Connections: Socially Isolated (03/10/2021)   Social Connection and Isolation Panel [NHANES]    Frequency of Communication with Friends and Family: Never    Frequency of Social Gatherings with Friends and Family: Never    Attends Religious Services: Never    Database administrator or Organizations: No    Attends Banker Meetings: Never    Marital Status: Divorced  Catering manager Violence: Not At Risk (03/10/2021)   Humiliation, Afraid, Rape, and Kick questionnaire    Fear of Current or Ex-Partner: No    Emotionally Abused: No    Physically Abused: No    Sexually Abused: No    Family History  Adopted: Yes  Problem Relation Age of Onset   Stroke Mother    Alzheimer's disease Mother    Cancer Mother    Diabetes Mother        Questionable, per new patient packet    Arthritis Mother        Questionable, per new patient packet    Alcoholism Father    Suicidality Sister    Depression Sister    COPD Sister    COPD Brother        Heavy smoker, per new patient packet    OCD Daughter    Personality disorder Daughter    Alcoholism Son    Bipolar disorder Granddaughter        Age 42 as of 07/10/2020   Schizophrenia Granddaughter      Review of Systems  Constitutional: Negative.  Negative for chills and fever.  HENT: Negative.  Negative for congestion and sore throat.    Respiratory: Negative.  Negative for cough and shortness of breath.   Cardiovascular: Negative.  Negative for chest pain and palpitations.  Gastrointestinal:  Negative for abdominal pain, diarrhea, nausea and vomiting.  Genitourinary: Negative.  Negative for dysuria and hematuria.  Skin: Negative.  Negative for rash.  Neurological:  Positive for dizziness.  All other systems reviewed and are negative.   Vitals:   12/09/22 1440  BP: (!) 94/56  Pulse: 66  Temp: 97.9 F (36.6 C)  SpO2: 98%   Repeat blood pressure: 90/50  Physical Exam Vitals reviewed.  Constitutional:      Appearance: Normal appearance.  HENT:     Head: Normocephalic.     Mouth/Throat:     Mouth: Mucous membranes are moist.     Pharynx: Oropharynx is clear.  Eyes:     Extraocular Movements: Extraocular movements intact.     Conjunctiva/sclera: Conjunctivae normal.     Pupils: Pupils are equal, round, and reactive to light.  Cardiovascular:     Rate and Rhythm: Normal rate and regular rhythm.     Pulses: Normal pulses.     Heart sounds: Normal heart sounds.  Pulmonary:     Effort: Pulmonary effort is normal.     Breath sounds: Normal  breath sounds.  Musculoskeletal:     Cervical back: No tenderness.  Lymphadenopathy:     Cervical: No cervical adenopathy.  Skin:    General: Skin is warm and dry.     Capillary Refill: Capillary refill takes less than 2 seconds.  Neurological:     General: No focal deficit present.     Mental Status: She is alert and oriented to person, place, and time.  Psychiatric:        Mood and Affect: Mood normal.        Behavior: Behavior normal.      ASSESSMENT & PLAN: A total of 45 minutes was spent with the patient and counseling/coordination of care regarding preparing for this visit, review of most recent office visit note, review of most recent blood work results from 11/26/2022, review of chronic medical conditions under management, review of all medications,  hypotension management, need to stop blood pressure medications for at least 48 hours, need to increase fluid intake, ED precautions, need to continue monitoring blood pressure readings at home daily, prognosis, documentation, and need for follow-up  Problem List Items Addressed This Visit       Cardiovascular and Mediastinum   Essential hypertension   Hypotension due to drugs - Primary    Recently started on olmesartan and amlodipine dose was increased Lightheadedness secondary to hypotension Advised to increased fluid intake as well as salt intake Recommend to stop blood pressure medications for at least 48 hours Monitor blood pressure readings at home.  If elevated, resume blood pressure medication. When ready to resume blood pressure medications recommend to start amlodipine 5 mg daily and stop olmesartan Advised to contact the office if numbers persistently abnormal over the next couple days and follow-up with PCP in 2 to 3 weeks        Nervous and Auditory   Moderate vascular dementia with anxiety (HCC)    Stable.  Takes Namenda 5 mg daily Also taking sertraline 100 mg daily        Genitourinary   Stage 3b chronic kidney disease    Last GFR 51 Advised to stay well-hydrated and avoid NSAIDs        Other   History of liver transplant    Stable.  Normal liver enzymes in recent CMP      Generalized anxiety disorder   Lightheadedness    Secondary to low blood pressure.  Overmedicated. Advised to stop blood pressure medication for 48 hours and continue monitoring blood pressure readings at home When and if blood pressure medication is needed, recommend to start amlodipine only.  Stop olmesartan.      Patient Instructions  Stop blood pressure medications for the next 48 hours Monitor blood pressure reading at home several times a day for the next several days.  When readings start going up, restart amlodipine 5 mg daily.  Do not restart olmesartan. Contact the office if  numbers persistently abnormal. Follow-up with PCP in 2 to 3 weeks.  Hypotension As your heart beats, it forces blood through your body. This force is called blood pressure. If you have hypotension, you have low blood pressure.  When your blood pressure is too low, you may not get enough blood to your brain or other parts of your body. This may cause you to feel weak, light-headed, have a fast heartbeat, or even faint. Low blood pressure may be harmless, or it may cause serious problems. What are the causes? Blood loss. Not enough water in the body (  dehydration). Heart problems. Hormone problems. Pregnancy. A very bad infection. Not having enough of certain nutrients. Very bad allergic reactions. Certain medicines. What increases the risk? Age. The risk increases as you get older. Conditions that affect the heart or the brain and spinal cord (central nervous system). What are the signs or symptoms? Feeling: Weak. Light-headed. Dizzy. Tired (fatigued). Blurred vision. Fast heartbeat. Fainting, in very bad cases. How is this treated? Changing your diet. This may involve drinking more water or including more salt (sodium) in your diet by eating high-salt foods. Taking medicines to raise your blood pressure. Changing how much you take (the dosage) of some of your medicines. Wearing compression stockings. These stockings help to prevent blood clots and reduce swelling in your legs. In some cases, you may need to go to the hospital to: Receive fluids through an IV tube. Receive donated blood through an IV tube (transfusion). Get treated for an infection or heart problems, if this applies. Be monitored while medicines that you are taking wear off. Follow these instructions at home: Eating and drinking  Drink enough fluids to keep your pee (urine) pale yellow. Eat a healthy diet. Follow instructions from your doctor about what you can eat or drink. A healthy diet includes: Fresh  fruits and vegetables. Whole grains. Low-fat (lean) meats. Low-fat dairy products. If told, include more salt in your diet. Do not add extra salt to your diet unless your doctor tells you to. Eat small meals often. Avoid standing up quickly after you eat. Medicines Take over-the-counter and prescription medicines only as told by your doctor. Follow instructions from your doctor about changing how much you take of your medicines, if this applies. Do not stop or change any of your medicines on your own. General instructions  Wear compression stockings as told by your doctor. Get up slowly from lying down or sitting. Avoid hot showers and a lot of heat as told by your doctor. Return to your normal activities when your doctor says that it is safe. Do not smoke or use any products that contain nicotine or tobacco. If you need help quitting, ask your doctor. Keep all follow-up visits. Contact a doctor if: You vomit. You have watery poop (diarrhea). You have a fever for more than 2-3 days. You feel more thirsty than normal. You feel weak and tired. Get help right away if: You have chest pain. You have a fast or uneven heartbeat. You lose feeling (have numbness) in any part of your body. You cannot move your arms or your legs. You have trouble talking. You get sweaty or feel light-headed. You faint. You have trouble breathing. You have trouble staying awake. You feel mixed up (confused). These symptoms may be an emergency. Get help right away. Call 911. Do not wait to see if the symptoms will go away. Do not drive yourself to the hospital. Summary Hypotension is also called low blood pressure. It is when the force of blood pumping through your body is too weak. Hypotension may be harmless, or it may cause serious problems. Treatment may include changing your diet and medicines, and wearing compression stockings. In very bad cases, you may need to go to the hospital. This  information is not intended to replace advice given to you by your health care provider. Make sure you discuss any questions you have with your health care provider. Document Revised: 01/20/2021 Document Reviewed: 01/20/2021 Elsevier Patient Education  2024 Elsevier Inc.     Edwina Barth, MD [place]  Primary Care at [facility]

## 2022-12-09 NOTE — Assessment & Plan Note (Signed)
Secondary to low blood pressure.  Overmedicated. Advised to stop blood pressure medication for 48 hours and continue monitoring blood pressure readings at home When and if blood pressure medication is needed, recommend to start amlodipine only.  Stop olmesartan.

## 2022-12-09 NOTE — Patient Instructions (Signed)
Stop blood pressure medications for the next 48 hours Monitor blood pressure reading at home several times a day for the next several days.  When readings start going up, restart amlodipine 5 mg daily.  Do not restart olmesartan. Contact the office if numbers persistently abnormal. Follow-up with PCP in 2 to 3 weeks.  Hypotension As your heart beats, it forces blood through your body. This force is called blood pressure. If you have hypotension, you have low blood pressure.  When your blood pressure is too low, you may not get enough blood to your brain or other parts of your body. This may cause you to feel weak, light-headed, have a fast heartbeat, or even faint. Low blood pressure may be harmless, or it may cause serious problems. What are the causes? Blood loss. Not enough water in the body (dehydration). Heart problems. Hormone problems. Pregnancy. A very bad infection. Not having enough of certain nutrients. Very bad allergic reactions. Certain medicines. What increases the risk? Age. The risk increases as you get older. Conditions that affect the heart or the brain and spinal cord (central nervous system). What are the signs or symptoms? Feeling: Weak. Light-headed. Dizzy. Tired (fatigued). Blurred vision. Fast heartbeat. Fainting, in very bad cases. How is this treated? Changing your diet. This may involve drinking more water or including more salt (sodium) in your diet by eating high-salt foods. Taking medicines to raise your blood pressure. Changing how much you take (the dosage) of some of your medicines. Wearing compression stockings. These stockings help to prevent blood clots and reduce swelling in your legs. In some cases, you may need to go to the hospital to: Receive fluids through an IV tube. Receive donated blood through an IV tube (transfusion). Get treated for an infection or heart problems, if this applies. Be monitored while medicines that you are taking  wear off. Follow these instructions at home: Eating and drinking  Drink enough fluids to keep your pee (urine) pale yellow. Eat a healthy diet. Follow instructions from your doctor about what you can eat or drink. A healthy diet includes: Fresh fruits and vegetables. Whole grains. Low-fat (lean) meats. Low-fat dairy products. If told, include more salt in your diet. Do not add extra salt to your diet unless your doctor tells you to. Eat small meals often. Avoid standing up quickly after you eat. Medicines Take over-the-counter and prescription medicines only as told by your doctor. Follow instructions from your doctor about changing how much you take of your medicines, if this applies. Do not stop or change any of your medicines on your own. General instructions  Wear compression stockings as told by your doctor. Get up slowly from lying down or sitting. Avoid hot showers and a lot of heat as told by your doctor. Return to your normal activities when your doctor says that it is safe. Do not smoke or use any products that contain nicotine or tobacco. If you need help quitting, ask your doctor. Keep all follow-up visits. Contact a doctor if: You vomit. You have watery poop (diarrhea). You have a fever for more than 2-3 days. You feel more thirsty than normal. You feel weak and tired. Get help right away if: You have chest pain. You have a fast or uneven heartbeat. You lose feeling (have numbness) in any part of your body. You cannot move your arms or your legs. You have trouble talking. You get sweaty or feel light-headed. You faint. You have trouble breathing. You have trouble  staying awake. You feel mixed up (confused). These symptoms may be an emergency. Get help right away. Call 911. Do not wait to see if the symptoms will go away. Do not drive yourself to the hospital. Summary Hypotension is also called low blood pressure. It is when the force of blood pumping through  your body is too weak. Hypotension may be harmless, or it may cause serious problems. Treatment may include changing your diet and medicines, and wearing compression stockings. In very bad cases, you may need to go to the hospital. This information is not intended to replace advice given to you by your health care provider. Make sure you discuss any questions you have with your health care provider. Document Revised: 01/20/2021 Document Reviewed: 01/20/2021 Elsevier Patient Education  2024 [employer].

## 2022-12-10 ENCOUNTER — Telehealth: Payer: Self-pay

## 2022-12-10 NOTE — Telephone Encounter (Signed)
Negative.  Will not accept this transfer of care.  Thanks.

## 2022-12-10 NOTE — Telephone Encounter (Signed)
Pt stated she wants transfer of care from Dr. Yetta Barre to Dr. Alvy Bimler. Please advise

## 2022-12-15 DIAGNOSIS — Z5181 Encounter for therapeutic drug level monitoring: Principal | ICD-10-CM

## 2022-12-15 DIAGNOSIS — E612 Magnesium deficiency: Principal | ICD-10-CM

## 2022-12-15 DIAGNOSIS — Z944 Liver transplant status: Principal | ICD-10-CM

## 2022-12-15 NOTE — Unmapped (Signed)
error 

## 2022-12-15 NOTE — Unmapped (Signed)
Placed standing liver txp lab orders with [facility].

## 2022-12-19 ENCOUNTER — Other Ambulatory Visit: Payer: Self-pay | Admitting: Internal Medicine

## 2022-12-19 DIAGNOSIS — I10 Essential (primary) hypertension: Secondary | ICD-10-CM

## 2022-12-21 DIAGNOSIS — Z5181 Encounter for therapeutic drug level monitoring: Principal | ICD-10-CM

## 2022-12-21 DIAGNOSIS — Z944 Liver transplant status: Principal | ICD-10-CM

## 2022-12-21 DIAGNOSIS — E612 Magnesium deficiency: Principal | ICD-10-CM

## 2022-12-24 ENCOUNTER — Ambulatory Visit (INDEPENDENT_AMBULATORY_CARE_PROVIDER_SITE_OTHER): Payer: Medicare HMO

## 2022-12-24 ENCOUNTER — Ambulatory Visit: Payer: Medicare Other | Admitting: Internal Medicine

## 2022-12-24 VITALS — BP 141/78 | Ht 63.0 in | Wt 132.0 lb

## 2022-12-24 DIAGNOSIS — Z0000 Encounter for general adult medical examination without abnormal findings: Secondary | ICD-10-CM | POA: Diagnosis not present

## 2022-12-24 NOTE — Progress Notes (Signed)
Subjective:   Candace Hill is a 77 y.o. female who presents for Medicare Annual (Subsequent) preventive examination.  Visit Complete: Virtual  I connected with  Candace Hill on 12/24/22 by a audio enabled telemedicine application and verified that I am speaking with the correct person using two identifiers.  Patient Location: Home  Provider Location: Home Office  I discussed the limitations of evaluation and management by telemedicine. The patient expressed understanding and agreed to proceed.  Review of Systems    Cardiac Risk Factors include: advanced age (>68men, >10 women);dyslipidemia;hypertension     Objective:    Today's Vitals   12/24/22 1300  BP: (!) 141/78  Weight: 132 lb (59.9 kg)  Height: 5\' 3"  (1.6 m)   Body mass index is 23.38 kg/m.     12/24/2022    1:16 PM 11/11/2022    3:25 PM 05/06/2022    2:42 PM 04/29/2022   12:56 PM 04/23/2022    3:38 PM 07/29/2021   10:47 AM 07/23/2021   10:12 AM  Advanced Directives  Does Patient Have a Medical Advance Directive? Yes No No No No No No  Type of Estate agent of [place];Living will        Copy of Healthcare Power of Attorney in Chart? No - copy requested        Would patient like information on creating a medical advance directive?  No - Patient declined         Current Medications (verified) Outpatient Encounter Medications as of 12/24/2022  Medication Sig   amLODipine (NORVASC) 5 MG tablet TAKE 1 TABLET BY MOUTH DAILY   aspirin EC 81 MG tablet Take 81 mg by mouth daily.   atorvastatin (LIPITOR) 10 MG tablet Take 1 tablet (10 mg total) by mouth daily.   Biotin 800 MCG TABS Take 1 tablet by mouth.   CALCIUM-VITAMIN D PO Take 600 mg by mouth daily.   Ferrous Sulfate (IRON) 325 (65 Fe) MG TABS Take 1 tablet by mouth daily.   folic acid (FOLVITE) 400 MCG tablet Take 800 mcg by mouth daily.   memantine (NAMENDA) 5 MG tablet Take 1 tablet (5 mg at night) for 2 weeks, then increase to 1  tablet (5 mg) twice a day   olmesartan (BENICAR) 20 MG tablet TAKE 1 TABLET BY MOUTH DAILY   sertraline (ZOLOFT) 100 MG tablet Take 1 tablet (100 mg total) by mouth daily.   tacrolimus (PROGRAF) 0.5 MG capsule 6 by mouth daily NAME BRAND ONLY   No facility-administered encounter medications on file as of 12/24/2022.    Allergies (verified) Codeine, Other, Penicillins, Phenergan [promethazine], Sulfa antibiotics, Tamsulosin, and Lidocaine   History: Past Medical History:  Diagnosis Date   Acute cystitis without hematuria 03/12/2022   Allergic rhinitis 11/14/2012   Amblyopia of eye, left 06/17/2017   Asbestos exposure 04/08/2018   Asthma    1994-1994 due to black mold house, no asthma now   Broken ankle    right   Broken rib    x2   Cataract    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 03/26/2011   Chronic hepatitis C virus infection 05/31/2013   Complication of anesthesia    IV phenergan  serious mental reaction   Delusional thoughts 03/18/2022   Diarrhea 03/13/2022   Essential hypertension 10/14/2017   Gastroesophageal reflux disease 10/14/2017   Generalized anxiety disorder    Hernia, femoral    left   History of blood transfusion 1971  acquired hepatitis c   History of kidney stones    History of liver transplant 11/14/2012   History of nephrolithiasis 10/14/2017   History of renal stone    History of shingles    Hyperglycemia 04/08/2018   Hyperlipidemia with target LDL less than 130 01/15/2021   The 10-year ASCVD risk score (Arnett DK, et al., 2019) is: 16.2%   Values used to calculate the score:     Age: 51 years     Sex: Female     Is Non-Hispanic African American: No     Diabetic: No     Tobacco smoker: No     Systolic Blood Pressure: 136 mmHg     Is BP treated: No     HDL Cholesterol: 51.2 mg/dL     Total Cholesterol: 202 mg/dL   Intrinsic eczema 16/03/9603   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-will get Avastin injections off/on   Vitamin D deficiency 04/08/2018   Weight loss, non-intentional 03/12/2022   Past Surgical History:  Procedure Laterality Date   CATARACT EXTRACTION Bilateral    CHOLECYSTECTOMY  1997   CYSTOSCOPY W/ URETERAL STENT PLACEMENT Left 08/14/2016   Procedure: CYSTOSCOPY WITH RETROGRADE PYELOGRAM/URETERAL LEFT URETEROSCOPY AND LEFT STENT PLACEMENT;  Surgeon: Heloise Purpura, MD;  Location: [facility];  Service: Urology;  Laterality: Left;   CYSTOSCOPY/URETEROSCOPY/HOLMIUM LASER/STENT PLACEMENT Left 07/06/2016   Procedure: CYSTOSCOPY/URETEROSCOPY/ BILATERAL RETROGRADE/HOLMIUM LASER/STENT PLACEMENT/BASKET STONE REMOVAL;  Surgeon: Heloise Purpura, MD;  Location: [facility];  Service: Urology;  Laterality: Left;   CYSTOSCOPY/URETEROSCOPY/HOLMIUM LASER/STENT PLACEMENT Left 08/06/2016   Procedure: CYSTOSCOPY/URETEROSCOPY/HOLMIUM LASER/STENT PLACEMENT;  Surgeon: Heloise Purpura, MD;  Location: [facility];  Service: Urology;  Laterality: Left;   CYSTOSCOPY/URETEROSCOPY/HOLMIUM LASER/STENT PLACEMENT Left 08/31/2016   Procedure: CYSTOSCOPY/URETEROSCOPY/HOLMIUM LASER/STENT PLACEMENT/BASKET STONE REMOVAL;  Surgeon: Heloise Purpura, MD;  Location: [facility];  Service: Urology;  Laterality: Left;   FEMORAL HERNIA REPAIR Left 1997   left leg-   LIVER TRANSPLANTATION     NASAL SINUS SURGERY  1997   x3   TONSILLECTOMY  age 44   TUBAL LIGATION     Family History  Adopted: Yes   Problem Relation Age of Onset   Stroke Mother    Alzheimer's disease Mother    Cancer Mother    Diabetes Mother        Questionable, per new patient packet    Arthritis Mother        Questionable, per new patient packet    Alcoholism Father    Suicidality Sister    Depression Sister    COPD Sister    COPD Brother        Heavy smoker, per new patient packet    OCD Daughter    Personality disorder Daughter    Alcoholism Son    Bipolar disorder Granddaughter        Age 43 as of 07/10/2020   Schizophrenia  Granddaughter    Social History   Socioeconomic History   Marital status: Divorced    Spouse name: Not on file   Number of children: 2   Years of education: 14   Highest education level: Associate degree: occupational, Scientist, product/process development, or vocational program  Occupational History   Occupation: Disabled  Tobacco Use   Smoking status: Never   Smokeless tobacco: Never  Vaping Use   Vaping status: Never Used  Substance and Sexual Activity   Alcohol use: No   Drug use: No   Sexual activity: Never    Comment: 14 years ago  Other Topics Concern   Not on file  Social History Narrative   Lives at alone alone.   Right-handed.   No caffeine use.      PSC- as of 10/12/17   Diet: No Fried Foods       Caffeine: Summer time, sweet tea       Married, if yes what year: Divorced       Do you live in a house, apartment, assisted living, condo, trailer, ect: [place] [facility], 1 stories, and 1 person       Pets: 1 dog       Current/Past profession: Dentist       Exercise: Some, walking dog       Living Will: yes   DNR: yes   POA/HPOA: yes      Functional Status: Patient did not answer   Do you have difficulty bathing or dressing yourself?   Do you have difficulty preparing food or eating?   Do you have difficulty managing your medications?   Do you have difficulty managing your finances?   Do you have difficulty affording your medications?   Social Determinants of  Health   Financial Resource Strain: Low Risk  (12/24/2022)   Overall Financial Resource Strain (CARDIA)    Difficulty of Paying Living Expenses: Not hard at all  Food Insecurity: No Food Insecurity (12/24/2022)   Hunger Vital Sign    Worried About Running Out of Food in the Last Year: Never true    Ran Out of Food in the Last Year: Never true  Transportation Needs: Unmet Transportation Needs (12/24/2022)   PRAPARE - Administrator, Civil Service (Medical): Yes    Lack of Transportation (Non-Medical): No  Physical Activity: Insufficiently Active (12/24/2022)   Exercise Vital Sign    Days of Exercise per Week: 7 days    Minutes of Exercise per Session: 20 min  Stress: No Stress Concern Present (12/24/2022)   [employer] of Occupational Health - Occupational Stress Questionnaire    Feeling of Stress : Only a little  Social Connections: Moderately Isolated (12/24/2022)   Social Connection and Isolation Panel [NHANES]    Frequency of Communication with Friends and Family: More than three times a week    Frequency of Social Gatherings with Friends and Family: Twice a week    Attends Religious Services: Never    Database administrator or Organizations: Yes    Attends Banker Meetings: Never    Marital Status: Divorced    Tobacco Counseling Counseling given: Not Answered   Clinical Intake:  Pre-visit preparation completed: Yes  Pain : No/denies pain     BMI - recorded: 23.38 Nutritional Status: BMI of 19-24  Normal Nutritional Risks: Nausea/ vomitting/ diarrhea Diabetes: No  How often do you need to have someone help you when you read instructions, pamphlets, or other written  materials from your doctor or pharmacy?: 1 - Never  Interpreter Needed?: No  Information entered by :: Katurah Karapetian, RMA   Activities of Daily Living    12/24/2022    1:06 PM  In your present state of health, do you have any difficulty performing the following activities:   Hearing? 1  Comment A little, per patient  Vision? 1  Comment Loss sight in lt eye, per patient.  Difficulty concentrating or making decisions? 1  Comment memory is declining, per patient  Walking or climbing stairs? 0  Dressing or bathing? 0  Doing errands, shopping? 1  Comment Per patient, loss of lt eye sight.  Daughter drives her around.  Preparing Food and eating ? N  Using the Toilet? N  In the past six months, have you accidently leaked urine? N  Do you have problems with loss of bowel control? N  Managing your Medications? N  Managing your Finances? N  Housekeeping or managing your Housekeeping? N    Patient Care Team: Etta Grandchild, MD as PCP - General (Internal Medicine) Venancio Poisson, MD as Consulting Physician (Dermatology) Marvis Repress, MD as Referring Physician (Ophthalmology) Heloise Purpura, MD as Consulting Physician (Urology) Myrtie Neither Andreas Blower, MD as Consulting Physician (Gastroenterology)  Indicate any recent Medical Services you may have received from other than [facility] providers in the past year (date may be approximate).     Assessment:   This is a routine wellness examination for [place].  Hearing/Vision screen Hearing Screening - Comments:: Denies hearing difficulties    Dietary issues and exercise activities discussed:     Goals Addressed   None   Depression Screen    12/24/2022    1:28 PM 10/27/2022   10:38 AM 08/28/2022    7:53 AM 07/17/2022    3:29 PM 03/12/2022    2:54 PM 06/24/2021    2:30 PM 04/14/2021   12:59 PM  PHQ 2/9 Scores  PHQ - 2 Score 2 0   0 2 4  PHQ- 9 Score 5    0  11     Information is confidential and restricted. Go to Review Flowsheets to unlock data.     Fall Risk    12/24/2022    1:18 PM 12/09/2022    2:43 PM 11/11/2022    3:25 PM 04/29/2022   12:55 PM 07/29/2021   10:46 AM  Fall Risk   Falls in the past year? 1 1 1 1  0  Number falls in past yr: 1 0 0 1 0  Injury with Fall? 1 0 0 1 0  Risk for fall due to :  History of fall(s);Medication side effect No Fall Risks     Follow up Falls prevention discussed;Falls evaluation completed Falls evaluation completed Falls evaluation completed Falls evaluation completed     MEDICARE RISK AT HOME:  Medicare Risk at Home - 12/24/22 1319     Any stairs in or around the home? No    Home free of loose throw rugs in walkways, pet beds, electrical cords, etc? Yes    Adequate lighting in your home to reduce risk of falls? Yes    Life alert? No    Use of a cane, walker or w/c? No    Grab bars in the bathroom? Yes    Shower chair or bench in shower? No    Elevated toilet seat or a handicapped toilet? No             TIMED UP  AND GO:  Was the test performed?  No    Cognitive Function:    11/11/2022    4:00 PM 12/23/2018    9:09 AM 12/20/2017   11:44 AM  MMSE - Mini Mental State Exam  Orientation to time 5 4 4   Orientation to Place 5 4 4   Registration 3 3 3   Attention/ Calculation 5 5 5   Recall 2 3 3   Language- name 2 objects 2 2 2   Language- repeat 1 1 1   Language- follow 3 step command 3 3 3   Language- read & follow direction 1 1 1   Write a sentence 1 1 1   Copy design 1 1 1   Total score 29 28 28       07/29/2021   11:00 AM  Montreal Cognitive Assessment   Visuospatial/ Executive (0/5) 4  Naming (0/3) 3  Attention: Read list of digits (0/2) 2  Attention: Read list of letters (0/1) 1  Attention: Serial 7 subtraction starting at 100 (0/3) 1  Language: Repeat phrase (0/2) 2  Language : Fluency (0/1) 1  Abstraction (0/2) 2  Delayed Recall (0/5) 0  Orientation (0/6) 6  Total 22  Adjusted Score (based on education) 22      12/24/2022    1:21 PM 03/10/2021    5:59 PM 01/02/2020   11:42 AM  6CIT Screen  What Year? 0 points 0 points 0 points  What month? 0 points 0 points 0 points  What time? 0 points 0 points 0 points  Count back from 20 0 points 0 points 0 points  Months in reverse 0 points 0 points 0 points  Repeat phrase 8 points 10  points 2 points  Total Score 8 points 10 points 2 points    Immunizations Immunization History  Administered Date(s) Administered   Fluad Quad(high Dose 65+) 02/15/2019, 03/12/2022   Influenza, Seasonal, Injecte, Preservative Fre 03/18/2009   Influenza,inj,Quad PF,6+ Mos 04/08/2018, 07/10/2020   Influenza-Unspecified 03/16/2017   Moderna Sars-Covid-2 Vaccination 06/14/2019, 07/12/2019   Pneumococcal Conjugate-13 07/12/2018   Pneumococcal Polysaccharide-23 07/10/2020    TDAP status: Due, Education has been provided regarding the importance of this vaccine. Advised may receive this vaccine at local pharmacy or Health Dept. Aware to provide a copy of the vaccination record if obtained from local pharmacy or Health Dept. Verbalized acceptance and understanding.  Flu Vaccine status: Up to date  Pneumococcal vaccine status: Up to date  Covid-19 vaccine status: Completed vaccines  Qualifies for Shingles Vaccine? Yes   Zostavax completed No   Shingrix Completed?: No.    Education has been provided regarding the importance of this vaccine. Patient has been advised to call insurance company to determine out of pocket expense if they have not yet received this vaccine. Advised may also receive vaccine at local pharmacy or Health Dept. Verbalized acceptance and understanding.  Screening Tests Health Maintenance  Topic Date Due   DTaP/Tdap/Td (1 - Tdap) Never done   Zoster Vaccines- Shingrix (1 of 2) Never done   INFLUENZA VACCINE  01/14/2023   Medicare Annual Wellness (AWV)  12/24/2023   Pneumonia Vaccine 31+ Years old  Completed   DEXA SCAN  Completed   Hepatitis C Screening  Completed   HPV VACCINES  Aged Out   Colonoscopy  Discontinued   COVID-19 Vaccine  Discontinued    Health Maintenance  Health Maintenance Due  Topic Date Due   DTaP/Tdap/Td (1 - Tdap) Never done   Zoster Vaccines- Shingrix (1 of 2) Never done  Colorectal cancer screening: No longer required.    Mammogram status: No longer required due to age.  Bone Density status: Completed 08/05/05. Results reflect: Bone density results: OSTEOPENIA. Repeat every 3 years.  Lung Cancer Screening: (Low Dose CT Chest recommended if Age 44-80 years, 20 pack-year currently smoking OR have quit w/in 15years.) does not qualify.   Lung Cancer Screening Referral: n/a  Additional Screening:  Hepatitis C Screening: does qualify.  Vision Screening: Recommended annual ophthalmology exams for early detection of glaucoma and other disorders of the eye. Is the patient up to date with their annual eye exam?  No  Who is the provider or what is the name of the office in which the patient attends annual eye exams? [facility] If pt is not established with a provider, would they like to be referred to a provider to establish care? No .   Dental Screening: Recommended annual dental exams for proper oral hygiene  Community Resource Referral / Chronic Care Management: CRR required this visit?  No   CCM required this visit?  No     Plan:     I have personally reviewed and noted the following in the patient's chart:   Medical and social history Use of alcohol, tobacco or illicit drugs  Current medications and supplements including opioid prescriptions. Patient is not currently taking opioid prescriptions. Functional ability and status Nutritional status Physical activity Advanced directives List of other physicians Hospitalizations, surgeries, and ER visits in previous 12 months Vitals Screenings to include cognitive, depression, and falls Referrals and appointments  In addition, I have reviewed and discussed with patient certain preventive protocols, quality metrics, and best practice recommendations. A written personalized care plan for preventive services as well as general preventive health recommendations were provided to patient.     Alric Geise L Gaje Tennyson, CMA   12/24/2022   After Visit Summary: (Mail)  Due to this being a telephonic visit, the after visit summary with patients personalized plan was offered to patient via mail   Nurse Notes: Patient explained that she just recently loss sight in lt eye and that she no longer can drive herself around.  She is feeling discouraged from this recent problem.  Her life goal is to try and stay independent for as long as she can.

## 2022-12-24 NOTE — Patient Instructions (Signed)
Candace Hill , Thank you for taking time to come for your Medicare Wellness Visit. I appreciate your ongoing commitment to your health goals. Please review the following plan we discussed and let me know if I can assist you in the future.   These are the goals we discussed:  Goals      Patient Stated     Would like to start singing again        This is a list of the screening recommended for you and due dates:  Health Maintenance  Topic Date Due   DTaP/Tdap/Td vaccine (1 - Tdap) Never done   Zoster (Shingles) Vaccine (1 of 2) Never done   Flu Shot  01/14/2023   Medicare Annual Wellness Visit  12/24/2023   Pneumonia Vaccine  Completed   DEXA scan (bone density measurement)  Completed   Hepatitis C Screening  Completed   HPV Vaccine  Aged Out   Colon Cancer Screening  Discontinued   COVID-19 Vaccine  Discontinued    Advanced directives: Please bring a copy of your health care power of attorney and living will to the office to be added to your chart at your convenience.   Conditions/risks identified: You are doing very well.  Keep up the good work and stay positive in everything that you do.  Each day, aim for 6 glasses of water, plenty of protein in your diet and try to get up and walk/ stretch every hour for 5-10 minutes at a time.    Next appointment: Follow up in one year for your annual wellness visit    Preventive Care 65 Years and Older, Female Preventive care refers to lifestyle choices and visits with your health care provider that can promote health and wellness. What does preventive care include? A yearly physical exam. This is also called an annual well check. Dental exams once or twice a year. Routine eye exams. Ask your health care provider how often you should have your eyes checked. Personal lifestyle choices, including: Daily care of your teeth and gums. Regular physical activity. Eating a healthy diet. Avoiding tobacco and drug use. Limiting alcohol  use. Practicing safe sex. Taking low-dose aspirin every day. Taking vitamin and mineral supplements as recommended by your health care provider. What happens during an annual well check? The services and screenings done by your health care provider during your annual well check will depend on your age, overall health, lifestyle risk factors, and family history of disease. Counseling  Your health care provider may ask you questions about your: Alcohol use. Tobacco use. Drug use. Emotional well-being. Home and relationship well-being. Sexual activity. Eating habits. History of falls. Memory and ability to understand (cognition). Work and work Astronomer. Reproductive health. Screening  You may have the following tests or measurements: Height, weight, and BMI. Blood pressure. Lipid and cholesterol levels. These may be checked every 5 years, or more frequently if you are over 69 years old. Skin check. Lung cancer screening. You may have this screening every year starting at age 74 if you have a 30-pack-year history of smoking and currently smoke or have quit within the past 15 years. Fecal occult blood test (FOBT) of the stool. You may have this test every year starting at age 34. Flexible sigmoidoscopy or colonoscopy. You may have a sigmoidoscopy every 5 years or a colonoscopy every 10 years starting at age 28. Hepatitis C blood test. Hepatitis B blood test. Sexually transmitted disease (STD) testing. Diabetes screening. This is done by  checking your blood sugar (glucose) after you have not eaten for a while (fasting). You may have this done every 1-3 years. Bone density scan. This is done to screen for osteoporosis. You may have this done starting at age 5. Mammogram. This may be done every 1-2 years. Talk to your health care provider about how often you should have regular mammograms. Talk with your health care provider about your test results, treatment options, and if necessary,  the need for more tests. Vaccines  Your health care provider may recommend certain vaccines, such as: Influenza vaccine. This is recommended every year. Tetanus, diphtheria, and acellular pertussis (Tdap, Td) vaccine. You may need a Td booster every 10 years. Zoster vaccine. You may need this after age 43. Pneumococcal 13-valent conjugate (PCV13) vaccine. One dose is recommended after age 58. Pneumococcal polysaccharide (PPSV23) vaccine. One dose is recommended after age 21. Talk to your health care provider about which screenings and vaccines you need and how often you need them. This information is not intended to replace advice given to you by your health care provider. Make sure you discuss any questions you have with your health care provider. Document Released: 06/28/2015 Document Revised: 02/19/2016 Document Reviewed: 04/02/2015 Elsevier Interactive Patient Education  2017 [employer].  Fall Prevention in the Home Falls can cause injuries. They can happen to people of all ages. There are many things you can do to make your home safe and to help prevent falls. What can I do on the outside of my home? Regularly fix the edges of walkways and driveways and fix any cracks. Remove anything that might make you trip as you walk through a door, such as a raised step or threshold. Trim any bushes or trees on the path to your home. Use bright outdoor lighting. Clear any walking paths of anything that might make someone trip, such as rocks or tools. Regularly check to see if handrails are loose or broken. Make sure that both sides of any steps have handrails. Any raised decks and porches should have guardrails on the edges. Have any leaves, snow, or ice cleared regularly. Use sand or salt on walking paths during winter. Clean up any spills in your garage right away. This includes oil or grease spills. What can I do in the bathroom? Use night lights. Install grab bars by the toilet and in the  tub and shower. Do not use towel bars as grab bars. Use non-skid mats or decals in the tub or shower. If you need to sit down in the shower, use a plastic, non-slip stool. Keep the floor dry. Clean up any water that spills on the floor as soon as it happens. Remove soap buildup in the tub or shower regularly. Attach bath mats securely with double-sided non-slip rug tape. Do not have throw rugs and other things on the floor that can make you trip. What can I do in the bedroom? Use night lights. Make sure that you have a light by your bed that is easy to reach. Do not use any sheets or blankets that are too big for your bed. They should not hang down onto the floor. Have a firm chair that has side arms. You can use this for support while you get dressed. Do not have throw rugs and other things on the floor that can make you trip. What can I do in the kitchen? Clean up any spills right away. Avoid walking on wet floors. Keep items that you use a  lot in easy-to-reach places. If you need to reach something above you, use a strong step stool that has a grab bar. Keep electrical cords out of the way. Do not use floor polish or wax that makes floors slippery. If you must use wax, use non-skid floor wax. Do not have throw rugs and other things on the floor that can make you trip. What can I do with my stairs? Do not leave any items on the stairs. Make sure that there are handrails on both sides of the stairs and use them. Fix handrails that are broken or loose. Make sure that handrails are as long as the stairways. Check any carpeting to make sure that it is firmly attached to the stairs. Fix any carpet that is loose or worn. Avoid having throw rugs at the top or bottom of the stairs. If you do have throw rugs, attach them to the floor with carpet tape. Make sure that you have a light switch at the top of the stairs and the bottom of the stairs. If you do not have them, ask someone to add them for  you. What else can I do to help prevent falls? Wear shoes that: Do not have high heels. Have rubber bottoms. Are comfortable and fit you well. Are closed at the toe. Do not wear sandals. If you use a stepladder: Make sure that it is fully opened. Do not climb a closed stepladder. Make sure that both sides of the stepladder are locked into place. Ask someone to hold it for you, if possible. Clearly mark and make sure that you can see: Any grab bars or handrails. First and last steps. Where the edge of each step is. Use tools that help you move around (mobility aids) if they are needed. These include: Canes. Walkers. Scooters. Crutches. Turn on the lights when you go into a dark area. Replace any light bulbs as soon as they burn out. Set up your furniture so you have a clear path. Avoid moving your furniture around. If any of your floors are uneven, fix them. If there are any pets around you, be aware of where they are. Review your medicines with your doctor. Some medicines can make you feel dizzy. This can increase your chance of falling. Ask your doctor what other things that you can do to help prevent falls. This information is not intended to replace advice given to you by your health care provider. Make sure you discuss any questions you have with your health care provider. Document Released: 03/28/2009 Document Revised: 11/07/2015 Document Reviewed: 07/06/2014 Elsevier Interactive Patient Education  2017 [employer].

## 2022-12-25 ENCOUNTER — Other Ambulatory Visit: Payer: Self-pay | Admitting: Internal Medicine

## 2022-12-25 DIAGNOSIS — E785 Hyperlipidemia, unspecified: Secondary | ICD-10-CM

## 2022-12-28 DIAGNOSIS — Z5181 Encounter for therapeutic drug level monitoring: Principal | ICD-10-CM

## 2022-12-28 DIAGNOSIS — E612 Magnesium deficiency: Principal | ICD-10-CM

## 2022-12-28 DIAGNOSIS — Z944 Liver transplant status: Principal | ICD-10-CM

## 2022-12-30 ENCOUNTER — Ambulatory Visit (INDEPENDENT_AMBULATORY_CARE_PROVIDER_SITE_OTHER): Payer: Medicare HMO | Admitting: Licensed Clinical Social Worker

## 2022-12-30 DIAGNOSIS — F411 Generalized anxiety disorder: Secondary | ICD-10-CM | POA: Diagnosis not present

## 2022-12-30 DIAGNOSIS — F331 Major depressive disorder, recurrent, moderate: Secondary | ICD-10-CM | POA: Diagnosis not present

## 2022-12-30 NOTE — Progress Notes (Addendum)
THERAPIST PROGRESS NOTE  Session Time: 1-2p  Behavioral Health Outpatient [facility] in office visit for patient and LCSW clinician  Participation Level: Active  Behavioral Response: Neat and Well GroomedAlert and ConfusedAnxious and Depressed  Type of Therapy: Individual Therapy  Treatment Goals addressed: Learn and implement coping skills that result in a reduction of anxiety and worry, and improve daily functioning per pt report 3 out of 5 sessions documented   Reduce overall frequency, intensity, and duration of the anxiety so that daily functioning is not impaired per pt self report 3 out of 5 sessions documented   ProgressTowards Goals: Progressing  Interventions: Solution Focused, Supportive, and Other: trauma focused  Summary: Elga Santy is a 77 y.o. female who presents with symptoms consistent with anxiety disorder.  Patient reports that she is compliant with her Zoloft, and is compliant with her psychiatric appointments.  Patient reports good quality and quantity of sleep.  Clinician assisted pt with identifying situations/scenarios/schemas triggering anxiety and/or depression symptoms. Allowed pt to explore and express thoughts and feelings and discussed current coping mechanisms. Reviewed changes/recommendations.    Assisted pt with identifying anxiety triggers. Pt identified a recent visit by her great granddaughter as a trigger due to her constant yelling and screaming. Pt reports that she felt very anxious when her family member was yelling/screaming and ultimately asked them to leave. Praised pt for setting and adhering to a boundary. Pt states she felt really guilty about it. Discussed importance of using coping skills to manage any feelings of distress. Allowed pt to explore and identify any limits or boundaries that could be helpful with managing stress/anxiety triggers. Recommended pt read book "Boundaries: when to say yes and how to say no".   Reviewed stress  management strategies. Pt reflects understanding and willingness to cooperate and make positive changes.  Pt reports ongoing medical concerns including memory loss. Allowed pt to identify current symptoms, treatments, and future treatment plans/recommendations.Clinician and patient continued to discuss improving implementation and practice of relaxation activities and self-care, and encouraging patient to communicate thoughts, concerns, and feelings to medical professionals and family members/loved ones. Pt states she often skips communicating her medical concerns with others because she doesn't want to "bother" them.   Pt states that she has been confused and has been "mixing up my medications" recently.  Discussed need to have her daughter or another caregiver to assist pt with her medication if she is having difficulty. Discussed how medication mix ups could be detrimental to her physical and emotional well being. Pt reflects understanding and made statement that she will discuss with her daughter.  Continued recommendations are as follows: self care behaviors, positive social engagements, focusing on overall work/home/life balance, and focusing on positive physical and emotional wellness.   Suicidal/Homicidal: No. Pt made the statement that she feels her health is so bad she could die any day "and I would be okay with that". Pt denies any suicidal thoughts, plans, or intent.   Therapist Response: Pt is continuing to apply interventions learned in session into daily life situations. Pt is currently on track to meet goals utilizing interventions mentioned above. Personal growth and progress noted. Treatment to continue as indicated.   Patient is demonstrating continued development in overall self-awareness, and improvements and life management behaviors.  Patient is demonstrating that she is able to identify concerns and brainstorm multiple solutions.  Plan: Informed patient that clinician will be  leaving outpatient department. Allowed pt to explore any questions or concerns and discussed future counseling  options/resources. Provided pt with psychoeducational resources and list of OPT therapists. Encouraged pt to continue with psychiatric med management appointments, if applicable.    Diagnosis:  Encounter Diagnoses  Name Primary?   GAD (generalized anxiety disorder) Yes   MDD (major depressive disorder), recurrent episode, moderate (HCC)     Collaboration of Care: Other Pt encouraged to continue care with psychiatrist of record, Dr. Eliseo Gum  Patient/Guardian was advised Release of Information must be obtained prior to any record release in order to collaborate their care with an outside provider. Patient/Guardian was advised if they have not already done so to contact the registration department to sign all necessary forms in order for [place] to release information regarding their care.   Consent: Patient/Guardian gives verbal consent for treatment and assignment of benefits for services provided during this visit. Patient/Guardian expressed understanding and agreed to proceed.   Ernest Haber Huckleberry Martinson, LCSW 12/30/2022

## 2022-12-30 NOTE — Patient Instructions (Signed)
Outpatient Psychiatry and Counseling  FOR CRISIS:  call 911, [facility]rapeutic Alternatives: Mobile Crisis Management 24 hours:  [phone], call [phone], [facility] ([facility]) [address] walk in, or go to your local EMERGENCY DEPARTMENT  [facility] [address], [place], [place] [address]  [phone]  [facility] [address] [place], [place] [address] [phone]  [facility] [address] [place],  [place]  [address] [phone]  [facility] Address: [address] Maurine Cane [place], [place] [address] Phone: [phone]  [facility] [place] and [place] Locations) [email or web address]  [employer] [facility] [employer] fee and walk in schedule: M-F 8am-12pm/1pm-3pm [address]  [place], [place] [address] [phone]  [facility] [address] [place], [place] [address] [phone]  [facility] Outpatient Services/ Intensive Outpatient Therapy Program/CDIOP/PHP [address] [place], [place] [address] [phone]  [facility], Outpatient Therapy Services, [place]      [phone]     [address]    [place], [place] [address]                 [facility]   [facility] [phone]. [address] [place], [place] [address]  [facility]          [address]  [place], [place] [address]       [phone]  [facility] [address] [place], [place] [address] [phone]  [facility]    [address]    [place], [place] [address]     [phone]       [facility] [address] [place] [address]  [facility]     [address] [place], [place]      [phone]       [facility] Eulogio Ditch, MD [address] [place], [place] [address] [phone]  [facility]     [address]     [place], [place] [address]     [phone]       [facility] [address] [place], [place] [address] [phone] Resources for Temporary Residential Assistance/Crisis Centers

## 2022-12-31 ENCOUNTER — Ambulatory Visit: Payer: Medicare Other | Admitting: Internal Medicine

## 2023-01-01 ENCOUNTER — Other Ambulatory Visit: Payer: Self-pay

## 2023-01-01 MED ORDER — MEMANTINE HCL 5 MG PO TABS: ORAL_TABLET | ORAL | 0 refills | Status: DC

## 2023-01-04 DIAGNOSIS — Z5181 Encounter for therapeutic drug level monitoring: Principal | ICD-10-CM

## 2023-01-04 DIAGNOSIS — Z944 Liver transplant status: Principal | ICD-10-CM

## 2023-01-04 DIAGNOSIS — E612 Magnesium deficiency: Principal | ICD-10-CM

## 2023-01-08 ENCOUNTER — Telehealth: Payer: Self-pay | Admitting: Internal Medicine

## 2023-01-08 DIAGNOSIS — I10 Essential (primary) hypertension: Secondary | ICD-10-CM

## 2023-01-08 DIAGNOSIS — E785 Hyperlipidemia, unspecified: Secondary | ICD-10-CM

## 2023-01-08 MED ORDER — AMLODIPINE BESYLATE 5 MG PO TABS: 5.0000 mg | ORAL_TABLET | Freq: Every day | ORAL | 0 refills | Status: DC

## 2023-01-08 MED ORDER — OLMESARTAN MEDOXOMIL 20 MG PO TABS: 20.0000 mg | ORAL_TABLET | Freq: Every day | ORAL | 0 refills | Status: DC

## 2023-01-08 MED ORDER — ATORVASTATIN CALCIUM 10 MG PO TABS: 10.0000 mg | ORAL_TABLET | Freq: Every day | ORAL | 0 refills | Status: DC

## 2023-01-08 MED FILL — TACROLIMUS 0.5 MG CAPSULE, IMMEDIATE-RELEASE: ORAL | 30 days supply | Qty: 180 | Fill #1

## 2023-01-08 NOTE — Telephone Encounter (Signed)
Rx sent to [facility].  Pt informed.

## 2023-01-08 NOTE — Telephone Encounter (Signed)
Prescription Request  01/08/2023  LOV: 09/24/2022  What is the name of the medication or equipment? amLODipine (NORVASC) 5 MG tablet  olmesartan (BENICAR) 20 MG tablet  atorvastatin (LIPITOR) 10 MG tablet  Have you contacted your pharmacy to request a refill? Yes   Which pharmacy would you like this sent to?  [facility]   Patient notified that their request is being sent to the clinical staff for review and that they should receive a response within 2 business days.   Please advise at [facility] [phone]

## 2023-01-08 NOTE — Unmapped (Signed)
[facility] Refill Coordination Note    Specialty Medication(s) to be Shipped:   Transplant: tacrolimus 0.5mg     Other medication(s) to be shipped: No additional medications requested for fill at this time     Kristy Hanson, DOB: May 10, 1946  Phone: [phone] (home)       All above HIPAA information was verified with patient.     Was a Nurse, learning disability used for this call? No    Completed refill call assessment today to schedule patient's medication shipment from the [facility] [phone]).  All relevant notes have been reviewed.     Specialty medication(s) and dose(s) confirmed: Regimen is correct and unchanged.   Changes to medications: Kristy Hanson reports no changes at this time.  Changes to insurance: No  New side effects reported not previously addressed with a pharmacist or physician: None reported  Questions for the pharmacist: No    Confirmed patient received a Conservation officer, historic buildings and a Surveyor, mining with first shipment. The patient will receive a drug information handout for each medication shipped and additional FDA Medication Guides as required.       DISEASE/MEDICATION-SPECIFIC INFORMATION        N/A    SPECIALTY MEDICATION ADHERENCE     Medication Adherence    Patient reported X missed doses in the last month: 0  Specialty Medication: tacrolimus 0.5 MG capsule (PROGRAF)  Patient is on additional specialty medications: No              Were doses missed due to medication being on hold? No    Tacrolimus 0.5mg   : 2 days of medicine on hand       REFERRAL TO PHARMACIST     Referral to the pharmacist: Not needed      [facility]     Shipping address confirmed in Epic.       Delivery Scheduled: Yes, Expected medication delivery date: 7/29.     Medication will be delivered via  ups  to the prescription address in Epic WAM.    Kristy Hanson   [facility] Specialty Technician

## 2023-01-11 DIAGNOSIS — Z944 Liver transplant status: Principal | ICD-10-CM

## 2023-01-11 DIAGNOSIS — Z5181 Encounter for therapeutic drug level monitoring: Principal | ICD-10-CM

## 2023-01-11 DIAGNOSIS — E612 Magnesium deficiency: Principal | ICD-10-CM

## 2023-01-18 DIAGNOSIS — Z944 Liver transplant status: Principal | ICD-10-CM

## 2023-01-18 DIAGNOSIS — Z5181 Encounter for therapeutic drug level monitoring: Principal | ICD-10-CM

## 2023-01-18 DIAGNOSIS — E612 Magnesium deficiency: Principal | ICD-10-CM

## 2023-01-25 ENCOUNTER — Ambulatory Visit (INDEPENDENT_AMBULATORY_CARE_PROVIDER_SITE_OTHER): Payer: Medicare HMO | Admitting: Internal Medicine

## 2023-01-25 ENCOUNTER — Encounter: Payer: Self-pay | Admitting: Internal Medicine

## 2023-01-25 VITALS — BP 136/64 | HR 70 | Temp 97.9°F | Resp 16 | Ht 63.0 in | Wt 135.0 lb

## 2023-01-25 DIAGNOSIS — I700 Atherosclerosis of aorta: Secondary | ICD-10-CM | POA: Diagnosis not present

## 2023-01-25 DIAGNOSIS — E785 Hyperlipidemia, unspecified: Secondary | ICD-10-CM | POA: Diagnosis not present

## 2023-01-25 DIAGNOSIS — L989 Disorder of the skin and subcutaneous tissue, unspecified: Secondary | ICD-10-CM | POA: Diagnosis not present

## 2023-01-25 DIAGNOSIS — Z23 Encounter for immunization: Secondary | ICD-10-CM

## 2023-01-25 DIAGNOSIS — Z0001 Encounter for general adult medical examination with abnormal findings: Secondary | ICD-10-CM | POA: Diagnosis not present

## 2023-01-25 DIAGNOSIS — N1832 Chronic kidney disease, stage 3b: Secondary | ICD-10-CM

## 2023-01-25 DIAGNOSIS — I10 Essential (primary) hypertension: Secondary | ICD-10-CM

## 2023-01-25 DIAGNOSIS — E612 Magnesium deficiency: Principal | ICD-10-CM

## 2023-01-25 DIAGNOSIS — Z5181 Encounter for therapeutic drug level monitoring: Principal | ICD-10-CM

## 2023-01-25 DIAGNOSIS — Z944 Liver transplant status: Principal | ICD-10-CM

## 2023-01-25 MED ORDER — SHINGRIX 50 MCG/0.5ML IM SUSR
0.5000 mL | Freq: Once | INTRAMUSCULAR | 1 refills | Status: AC
Start: 2023-01-25 — End: 2023-01-25

## 2023-01-25 MED ORDER — BOOSTRIX 5-2.5-18.5 LF-MCG/0.5 IM SUSP
0.5000 mL | Freq: Once | INTRAMUSCULAR | 0 refills | Status: AC
Start: 2023-01-25 — End: 2023-01-25

## 2023-01-25 NOTE — Patient Instructions (Signed)
Hypertension, Adult High blood pressure (hypertension) is when the force of blood pumping through the arteries is too strong. The arteries are the blood vessels that carry blood from the heart throughout the body. Hypertension forces the heart to work harder to pump blood and may cause arteries to become narrow or stiff. Untreated or uncontrolled hypertension can lead to a heart attack, heart failure, a stroke, kidney disease, and other problems. A blood pressure reading consists of a higher number over a lower number. Ideally, your blood pressure should be below 120/80. The first ("top") number is called the systolic pressure. It is a measure of the pressure in your arteries as your heart beats. The second ("bottom") number is called the diastolic pressure. It is a measure of the pressure in your arteries as the heart relaxes. What are the causes? The exact cause of this condition is not known. There are some conditions that result in high blood pressure. What increases the risk? Certain factors may make you more likely to develop high blood pressure. Some of these risk factors are under your control, including: Smoking. Not getting enough exercise or physical activity. Being overweight. Having too much fat, sugar, calories, or salt (sodium) in your diet. Drinking too much alcohol. Other risk factors include: Having a personal history of heart disease, diabetes, high cholesterol, or kidney disease. Stress. Having a family history of high blood pressure and high cholesterol. Having obstructive sleep apnea. Age. The risk increases with age. What are the signs or symptoms? High blood pressure may not cause symptoms. Very high blood pressure (hypertensive crisis) may cause: Headache. Fast or irregular heartbeats (palpitations). Shortness of breath. Nosebleed. Nausea and vomiting. Vision changes. Severe chest pain, dizziness, and seizures. How is this diagnosed? This condition is diagnosed by  measuring your blood pressure while you are seated, with your arm resting on a flat surface, your legs uncrossed, and your feet flat on the floor. The cuff of the blood pressure monitor will be placed directly against the skin of your upper arm at the level of your heart. Blood pressure should be measured at least twice using the same arm. Certain conditions can cause a difference in blood pressure between your right and left arms. If you have a high blood pressure reading during one visit or you have normal blood pressure with other risk factors, you may be asked to: Return on a different day to have your blood pressure checked again. Monitor your blood pressure at home for 1 week or longer. If you are diagnosed with hypertension, you may have other blood or imaging tests to help your health care provider understand your overall risk for other conditions. How is this treated? This condition is treated by making healthy lifestyle changes, such as eating healthy foods, exercising more, and reducing your alcohol intake. You may be referred for counseling on a healthy diet and physical activity. Your health care provider may prescribe medicine if lifestyle changes are not enough to get your blood pressure under control and if: Your systolic blood pressure is above 130. Your diastolic blood pressure is above 80. Your personal target blood pressure may vary depending on your medical conditions, your age, and other factors. Follow these instructions at home: Eating and drinking  Eat a diet that is high in fiber and potassium, and low in sodium, added sugar, and fat. An example of this eating plan is called the DASH diet. DASH stands for Dietary Approaches to Stop Hypertension. To eat this way: Eat   plenty of fresh fruits and vegetables. Try to fill one half of your plate at each meal with fruits and vegetables. Eat whole grains, such as whole-wheat pasta, brown rice, or whole-grain bread. Fill about one  fourth of your plate with whole grains. Eat or drink low-fat dairy products, such as skim milk or low-fat yogurt. Avoid fatty cuts of meat, processed or cured meats, and poultry with skin. Fill about one fourth of your plate with lean proteins, such as fish, chicken without skin, beans, eggs, or tofu. Avoid pre-made and processed foods. These tend to be higher in sodium, added sugar, and fat. Reduce your daily sodium intake. Many people with hypertension should eat less than 1,500 mg of sodium a day. Do not drink alcohol if: Your health care provider tells you not to drink. You are pregnant, may be pregnant, or are planning to become pregnant. If you drink alcohol: Limit how much you have to: 0-1 drink a day for women. 0-2 drinks a day for men. Know how much alcohol is in your drink. In the [place] one drink equals one 12 oz bottle of beer (355 mL), one 5 oz glass of wine (148 mL), or one 1 oz glass of hard liquor (44 mL). Lifestyle  Work with your health care provider to maintain a healthy body weight or to lose weight. Ask what an ideal weight is for you. Get at least 30 minutes of exercise that causes your heart to beat faster (aerobic exercise) most days of the week. Activities may include walking, swimming, or biking. Include exercise to strengthen your muscles (resistance exercise), such as Pilates or lifting weights, as part of your weekly exercise routine. Try to do these types of exercises for 30 minutes at least 3 days a week. Do not use any products that contain nicotine or tobacco. These products include cigarettes, chewing tobacco, and vaping devices, such as e-cigarettes. If you need help quitting, ask your health care provider. Monitor your blood pressure at home as told by your health care provider. Keep all follow-up visits. This is important. Medicines Take over-the-counter and prescription medicines only as told by your health care provider. Follow directions carefully. Blood  pressure medicines must be taken as prescribed. Do not skip doses of blood pressure medicine. Doing this puts you at risk for problems and can make the medicine less effective. Ask your health care provider about side effects or reactions to medicines that you should watch for. Contact a health care provider if you: Think you are having a reaction to a medicine you are taking. Have headaches that keep coming back (recurring). Feel dizzy. Have swelling in your ankles. Have trouble with your vision. Get help right away if you: Develop a severe headache or confusion. Have unusual weakness or numbness. Feel faint. Have severe pain in your chest or abdomen. Vomit repeatedly. Have trouble breathing. These symptoms may be an emergency. Get help right away. Call 911. Do not wait to see if the symptoms will go away. Do not drive yourself to the hospital. Summary Hypertension is when the force of blood pumping through your arteries is too strong. If this condition is not controlled, it may put you at risk for serious complications. Your personal target blood pressure may vary depending on your medical conditions, your age, and other factors. For most people, a normal blood pressure is less than 120/80. Hypertension is treated with lifestyle changes, medicines, or a combination of both. Lifestyle changes include losing weight, eating a healthy,   low-sodium diet, exercising more, and limiting alcohol. This information is not intended to replace advice given to you by your health care provider. Make sure you discuss any questions you have with your health care provider. Document Revised: 04/08/2021 Document Reviewed: 04/08/2021 Elsevier Patient Education  2024 [employer]  

## 2023-01-25 NOTE — Progress Notes (Unsigned)
Subjective:  Patient ID: Candace Hill, female    DOB: 24-Jan-1946  Age: 77 y.o. MRN: [id number]  CC: Hypertension, Hyperlipidemia, and Annual Exam   HPI Candace Hill presents for a CPX and f/up ----  Discussed the use of AI scribe software for clinical note transcription with the patient, who gave verbal consent to proceed.  History of Present Illness   The patient, with a history of hypertension, dementia, and liver transplant, presents with concerns about her blood pressure and digestive issues. They recently switched insurance companies, which has led to changes in their medication regimen.  Previously, the patient was on Olmesartan for hypertension, but this was discontinued due to episodes of low blood pressure. Since discontinuation, her blood pressure has normalized, and she reports no symptoms of chest pain, shortness of breath, or leg swelling. However, they do experience general dizziness, which is attributed to their dementia diagnosis.  Regarding her digestive issues, the patient reports a change in bowel habits, describing her stools as small, pellet-like, and difficult to sense when they need to go. They deny any blood in the stool, abdominal cramping, or straining. They have been taking Metamucil once daily for irregularity, but they admit to not drinking a lot of water.  The patient's memory issues persist, and they are on memantine, which is intended to slow the progression of their dementia symptoms. They do not report any improvement in memory since starting the medication.  The patient remains fairly active despite her medical conditions and does not report any new symptoms or concerns.       Outpatient Medications Prior to Visit  Medication Sig Dispense Refill   aspirin EC 81 MG tablet Take 81 mg by mouth daily.     atorvastatin (LIPITOR) 10 MG tablet Take 1 tablet (10 mg total) by mouth daily. 90 tablet 0   CALCIUM-VITAMIN D PO Take 600 mg by mouth daily.      Ferrous Sulfate (IRON) 325 (65 Fe) MG TABS Take 1 tablet by mouth daily.     folic acid (FOLVITE) 400 MCG tablet Take 800 mcg by mouth daily.     memantine (NAMENDA) 5 MG tablet Take 1 tablet (5 mg at night) for 2 weeks, then increase to 1 tablet (5 mg) twice a day 180 tablet 0   olmesartan (BENICAR) 20 MG tablet Take 1 tablet (20 mg total) by mouth daily. 90 tablet 0   sertraline (ZOLOFT) 100 MG tablet Take 1 tablet (100 mg total) by mouth daily. 30 tablet 2   tacrolimus (PROGRAF) 0.5 MG capsule 6 by mouth daily NAME BRAND ONLY     amLODipine (NORVASC) 5 MG tablet Take 1 tablet (5 mg total) by mouth daily. 90 tablet 0   Biotin 800 MCG TABS Take 1 tablet by mouth.     No facility-administered medications prior to visit.    ROS Review of Systems  Constitutional: Negative.   HENT: Negative.    Eyes: Negative.   Respiratory:  Negative for cough, chest tightness and wheezing.   Cardiovascular:  Negative for chest pain, palpitations and leg swelling.  Gastrointestinal:  Positive for constipation. Negative for abdominal pain, blood in stool, diarrhea, nausea and vomiting.  Endocrine: Negative.   Genitourinary:  Negative for difficulty urinating.  Musculoskeletal: Negative.  Negative for arthralgias and back pain.  Skin:  Positive for color change.  Allergic/Immunologic: Negative.   Neurological:  Negative for dizziness and weakness.  Hematological:  Negative for adenopathy. Does not bruise/bleed easily.  Psychiatric/Behavioral:  Positive for confusion and decreased concentration. Negative for behavioral problems and suicidal ideas. The patient is not nervous/anxious.     Objective:  BP 136/64 (BP Location: Left Arm, Patient Position: Sitting, Cuff Size: Large)   Pulse 70   Temp 97.9 F (36.6 C) (Oral)   Resp 16   Ht 5\' 3"  (1.6 m)   Wt 135 lb (61.2 kg)   SpO2 97%   BMI 23.91 kg/m   BP Readings from Last 3 Encounters:  01/25/23 136/64  12/24/22 (!) 141/78  12/09/22 (!) 94/56     Wt Readings from Last 3 Encounters:  01/25/23 135 lb (61.2 kg)  12/24/22 132 lb (59.9 kg)  12/09/22 132 lb 6 oz (60 kg)    Physical Exam  Lab Results  Component Value Date   WBC 7.0 09/11/2022   HGB 13.5 09/11/2022   HCT 38.4 09/11/2022   PLT 176 09/11/2022   GLUCOSE 91 09/11/2022   CHOL 202 (H) 03/12/2022   TRIG 86.0 03/12/2022   HDL 51.20 03/12/2022   LDLCALC 133 (H) 03/12/2022   ALT 7 05/06/2022   AST 15 05/06/2022   NA 139 09/11/2022   K 5.0 09/11/2022   CL 102 09/11/2022   CREATININE 1.09 (H) 09/11/2022   BUN 19 09/11/2022   CO2 28 09/11/2022   TSH 1.91 03/12/2022   INR 0.9 04/15/2021   HGBA1C 5.4 03/12/2022    No results found.  Assessment & Plan:  Stage 3b chronic kidney disease -     Basic metabolic panel; Future -     Urinalysis, Routine w reflex microscopic; Future  Hyperlipidemia with target LDL less than 130 -     Lipid panel; Future -     TSH; Future  Essential hypertension -     CBC with Differential/Platelet; Future -     TSH; Future  Encounter for general adult medical examination with abnormal findings  Lesion of skin of face -     Ambulatory referral to Dermatology  Need for prophylactic vaccination with combined diphtheria-tetanus-pertussis (DTP) vaccine -     Boostrix; Inject 0.5 mLs into the muscle once for 1 dose.  Dispense: 0.5 mL; Refill: 0  Need for varicella vaccine -     Shingrix; Inject 0.5 mLs into the muscle once for 1 dose.  Dispense: 0.5 mL; Refill: 1     Follow-up: Return in about 6 months (around 07/28/2023).  Sanda Linger, MD

## 2023-01-28 DIAGNOSIS — I700 Atherosclerosis of aorta: Secondary | ICD-10-CM | POA: Insufficient documentation

## 2023-01-28 DIAGNOSIS — Z23 Encounter for immunization: Secondary | ICD-10-CM | POA: Insufficient documentation

## 2023-01-28 HISTORY — DX: Atherosclerosis of aorta: I70.0

## 2023-01-29 NOTE — Unmapped (Signed)
[facility] Refill Coordination Note    Specialty Medication(s) to be Shipped:   Transplant: tacrolimus 1mg     Other medication(s) to be shipped: No additional medications requested for fill at this time     Kristy Hanson, DOB: [birth year]  Phone: [phone] (home)     All above HIPAA information was verified with patient.     Was a Nurse, learning disability used for this call? No    Completed refill call assessment today to schedule patient's medication shipment from the [facility] [phone]).  All relevant notes have been reviewed.     Specialty medication(s) and dose(s) confirmed: Regimen is correct and unchanged.   Changes to medications: Krishna reports no changes at this time.  Changes to insurance: No  New side effects reported not previously addressed with a pharmacist or physician: None reported  Questions for the pharmacist: No    Confirmed patient received a Conservation officer, historic buildings and a Surveyor, mining with first shipment. The patient will receive a drug information handout for each medication shipped and additional FDA Medication Guides as required.       DISEASE/MEDICATION-SPECIFIC INFORMATION        N/A    SPECIALTY MEDICATION ADHERENCE     Medication Adherence    Patient reported X missed doses in the last month: 0  Specialty Medication: tacrolimus 0.5 MG capsule (PROGRAF)  Patient is on additional specialty medications: No  Demonstrates understanding of importance of adherence: yes  Informant: patient        Were doses missed due to medication being on hold?  N/A    tacrolimus 1 mg: approximately 7 days of medicine on hand       REFERRAL TO PHARMACIST     Referral to the pharmacist: Not needed      [facility]     Shipping address confirmed in Epic.       Delivery Scheduled: Yes, Expected medication delivery date: 02/02/23.     Medication will be delivered via UPS to the prescription address in Epic WAM.    Roderic Palau, PharmD   [facility] Specialty Pharmacist

## 2023-02-01 DIAGNOSIS — Z944 Liver transplant status: Principal | ICD-10-CM

## 2023-02-01 DIAGNOSIS — Z5181 Encounter for therapeutic drug level monitoring: Principal | ICD-10-CM

## 2023-02-01 DIAGNOSIS — E612 Magnesium deficiency: Principal | ICD-10-CM

## 2023-02-01 MED FILL — TACROLIMUS 0.5 MG CAPSULE, IMMEDIATE-RELEASE: ORAL | 30 days supply | Qty: 180 | Fill #2

## 2023-02-05 ENCOUNTER — Ambulatory Visit (INDEPENDENT_AMBULATORY_CARE_PROVIDER_SITE_OTHER): Payer: Medicare HMO | Admitting: Student in an Organized Health Care Education/Training Program

## 2023-02-05 VITALS — BP 115/65 | HR 66 | Resp 16 | Wt 135.0 lb

## 2023-02-05 DIAGNOSIS — F331 Major depressive disorder, recurrent, moderate: Secondary | ICD-10-CM | POA: Diagnosis not present

## 2023-02-05 MED ORDER — SERTRALINE HCL 50 MG PO TABS
125.0000 mg | ORAL_TABLET | Freq: Every day | ORAL | 2 refills | Status: DC
Start: 2023-02-05 — End: 2023-03-26
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
## 2023-02-08 DIAGNOSIS — E612 Magnesium deficiency: Principal | ICD-10-CM

## 2023-02-08 DIAGNOSIS — Z944 Liver transplant status: Principal | ICD-10-CM

## 2023-02-08 DIAGNOSIS — Z5181 Encounter for therapeutic drug level monitoring: Principal | ICD-10-CM

## 2023-02-09 DIAGNOSIS — F339 Major depressive disorder, recurrent, unspecified: Secondary | ICD-10-CM | POA: Diagnosis not present

## 2023-02-15 DIAGNOSIS — E612 Magnesium deficiency: Principal | ICD-10-CM

## 2023-02-15 DIAGNOSIS — Z944 Liver transplant status: Principal | ICD-10-CM

## 2023-02-15 DIAGNOSIS — Z5181 Encounter for therapeutic drug level monitoring: Principal | ICD-10-CM

## 2023-02-22 DIAGNOSIS — E612 Magnesium deficiency: Principal | ICD-10-CM

## 2023-02-22 DIAGNOSIS — Z5181 Encounter for therapeutic drug level monitoring: Principal | ICD-10-CM

## 2023-02-22 DIAGNOSIS — Z944 Liver transplant status: Principal | ICD-10-CM

## 2023-02-22 NOTE — Unmapped (Signed)
TRF

## 2023-02-26 NOTE — Unmapped (Signed)
02/25/23 4:00PM Pt called and left VM complaining of pain and was transferred to Kidney transplant clinic by mistake. Pt stating that her pain was worsening and was asking for guidance about whether she should take Tylenol or Advil or something else for her pain since she was told as a liver transplant patient she needed to avoid those medications. Phone number provided for call back [phone]. Message forwarded to transplant coordinator.

## 2023-03-01 ENCOUNTER — Encounter (HOSPITAL_BASED_OUTPATIENT_CLINIC_OR_DEPARTMENT_OTHER): Payer: Medicare Other | Admitting: Obstetrics & Gynecology

## 2023-03-01 DIAGNOSIS — Z5181 Encounter for therapeutic drug level monitoring: Principal | ICD-10-CM

## 2023-03-01 DIAGNOSIS — Z944 Liver transplant status: Principal | ICD-10-CM

## 2023-03-01 DIAGNOSIS — E612 Magnesium deficiency: Principal | ICD-10-CM

## 2023-03-04 ENCOUNTER — Other Ambulatory Visit: Payer: Self-pay

## 2023-03-04 DIAGNOSIS — I728 Aneurysm of other specified arteries: Secondary | ICD-10-CM

## 2023-03-08 DIAGNOSIS — Z944 Liver transplant status: Principal | ICD-10-CM

## 2023-03-08 DIAGNOSIS — E612 Magnesium deficiency: Principal | ICD-10-CM

## 2023-03-08 DIAGNOSIS — Z5181 Encounter for therapeutic drug level monitoring: Principal | ICD-10-CM

## 2023-03-10 NOTE — Unmapped (Signed)
Patient was notified of operational disruptions. Patient opted to: schedule their refill with understanding of potential delay until 10/1 or later. This was facilitated by pharmacy staff     [facility] Refill Coordination Note    Specialty Medication(s) to be Shipped:   Transplant: tacrolimus 0.5 mg    Other medication(s) to be shipped: No additional medications requested for fill at this time     Kristy Hanson, DOB: 1945/06/24  Phone: [phone] (home)       All above HIPAA information was verified with patient.     Was a Nurse, learning disability used for this call? No    Completed refill call assessment today to schedule patient's medication shipment from the [facility]  [phone]).  All relevant notes have been reviewed.     Specialty medication(s) and dose(s) confirmed: Regimen is correct and unchanged.   Changes to medications: Kristy Hanson reports no changes at this time.  Changes to insurance: No  New side effects reported not previously addressed with a pharmacist or physician: None reported  Questions for the pharmacist: No    Confirmed patient received a Conservation officer, historic buildings and a Surveyor, mining with first shipment. The patient will receive a drug information handout for each medication shipped and additional FDA Medication Guides as required.       DISEASE/MEDICATION-SPECIFIC INFORMATION        N/A    SPECIALTY MEDICATION ADHERENCE     Medication Adherence    Patient reported X missed doses in the last month: 0  Specialty Medication: tacrolimus 0.5 MG capsule  Patient is on additional specialty medications: No              Were doses missed due to medication being on hold? No    Tacrolimus 0.5 mg: 4 days of medicine on hand        REFERRAL TO PHARMACIST     Referral to the pharmacist: Not needed      [facility]     Shipping address confirmed in Epic.       Delivery Scheduled: Yes, Expected medication delivery date: 03/12/23.     Medication will be delivered via UPS to the prescription address in Epic WAM.    [name]   [facility]  Specialty Technician

## 2023-03-11 MED FILL — TACROLIMUS 0.5 MG CAPSULE, IMMEDIATE-RELEASE: ORAL | 30 days supply | Qty: 180 | Fill #3

## 2023-03-12 NOTE — Unmapped (Signed)
Patient's daughter, April, contacted [facility] to report that her mother has c/o severe dental pain since having dental extractions in the last week and inquired what kind of pain meds she could take. She reports her dentist evaluated her mother yesterday and noted no issues with her healing. Daughter said she told patient that she should not take any NSAIDS, but her mother had taken 3 extra strength tylenol at one time today (1500mg ). Daughter reports she is in the early stages of dementia. Reinforced with daughter that patient should take no more than 1000mg  at once and no more than 3000mg  in a 24 hr period. Suggested she be seen by her dentist or an urgent care if pain persists to r/o other causes and suggest something else for pain. She verbalized understanding.

## 2023-03-15 DIAGNOSIS — Z944 Liver transplant status: Principal | ICD-10-CM

## 2023-03-15 DIAGNOSIS — Z5181 Encounter for therapeutic drug level monitoring: Principal | ICD-10-CM

## 2023-03-15 DIAGNOSIS — E612 Magnesium deficiency: Principal | ICD-10-CM

## 2023-03-17 ENCOUNTER — Other Ambulatory Visit: Payer: Self-pay | Admitting: Internal Medicine

## 2023-03-17 DIAGNOSIS — I10 Essential (primary) hypertension: Secondary | ICD-10-CM

## 2023-03-17 MED ORDER — OLMESARTAN MEDOXOMIL 20 MG PO TABS
20.0000 mg | ORAL_TABLET | Freq: Every day | ORAL | 0 refills | Status: DC
Start: 2023-03-17 — End: 2023-05-15

## 2023-03-17 MED ORDER — OLMESARTAN MEDOXOMIL 20 MG PO TABS
20.0000 mg | ORAL_TABLET | Freq: Every day | ORAL | 0 refills | Status: DC
Start: 2023-03-17 — End: 2023-03-17

## 2023-03-21 ENCOUNTER — Other Ambulatory Visit: Payer: Self-pay | Admitting: Internal Medicine

## 2023-03-21 DIAGNOSIS — I10 Essential (primary) hypertension: Secondary | ICD-10-CM

## 2023-03-22 ENCOUNTER — Other Ambulatory Visit: Payer: Self-pay

## 2023-03-22 DIAGNOSIS — Z944 Liver transplant status: Principal | ICD-10-CM

## 2023-03-22 DIAGNOSIS — E612 Magnesium deficiency: Principal | ICD-10-CM

## 2023-03-22 DIAGNOSIS — Z5181 Encounter for therapeutic drug level monitoring: Principal | ICD-10-CM

## 2023-03-22 MED ORDER — MEMANTINE HCL 5 MG PO TABS: ORAL_TABLET | ORAL | 0 refills | Status: DC

## 2023-03-26 ENCOUNTER — Telehealth: Payer: Self-pay | Admitting: *Deleted

## 2023-03-26 ENCOUNTER — Ambulatory Visit (HOSPITAL_COMMUNITY): Payer: Medicare HMO | Admitting: Student in an Organized Health Care Education/Training Program

## 2023-03-26 ENCOUNTER — Encounter (HOSPITAL_COMMUNITY): Payer: Self-pay | Admitting: Student in an Organized Health Care Education/Training Program

## 2023-03-26 VITALS — BP 132/75 | HR 63 | Wt 136.0 lb

## 2023-03-26 DIAGNOSIS — F0670 Mild neurocognitive disorder due to known physiological condition without behavioral disturbance: Secondary | ICD-10-CM

## 2023-03-26 DIAGNOSIS — F331 Major depressive disorder, recurrent, moderate: Secondary | ICD-10-CM

## 2023-03-26 MED ORDER — SERTRALINE HCL 100 MG PO TABS
100.0000 mg | ORAL_TABLET | Freq: Every day | ORAL | 3 refills | Status: DC
Start: 2023-03-26 — End: 2023-05-21

## 2023-03-26 MED ORDER — SERTRALINE HCL 50 MG PO TABS
50.0000 mg | ORAL_TABLET | Freq: Every day | ORAL | 3 refills | Status: DC
Start: 2023-03-26 — End: 2023-05-21
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
## 2023-03-26 NOTE — Progress Notes (Signed)
Care Coordination   Note   03/26/2023 Name: Anaya Arns MRN: [id number] DOB: 17-Feb-1946  Neviyah Talon is a 77 y.o. year old female who sees Etta Grandchild, MD for primary care. I reached out to [name] by phone today to offer care coordination services.  Ms. Hager was given information about Care Coordination services today including:   The Care Coordination services include support from the care team which includes your Nurse Coordinator, Clinical Social Worker, or Pharmacist.  The Care Coordination team is here to help remove barriers to the health concerns and goals most important to you. Care Coordination services are voluntary, and the patient may decline or stop services at any time by request to their care team member.   Care Coordination Consent Status: Patient agreed to services and verbal consent obtained.   Follow up plan:  Telephone appointment with care coordination team member scheduled for:  03/29/2023  Encounter Outcome:  Patient Scheduled from referral   Burman Nieves, [facility] Care Coordination Care Guide Direct Dial: [phone]

## 2023-03-29 ENCOUNTER — Ambulatory Visit: Payer: Self-pay | Admitting: Licensed Clinical Social Worker

## 2023-03-29 DIAGNOSIS — E612 Magnesium deficiency: Principal | ICD-10-CM

## 2023-03-29 DIAGNOSIS — Z944 Liver transplant status: Principal | ICD-10-CM

## 2023-03-29 DIAGNOSIS — Z5181 Encounter for therapeutic drug level monitoring: Principal | ICD-10-CM

## 2023-03-30 NOTE — Unmapped (Signed)
[facility] Delivery Pharm[facility] Refill Coordination Note    Specialty Medication(s) to be Shipped:   Transplant: tacrolimus 0.5mg     Other medication(s) to be shipped: No additional medications requested for fill at this time     Kristy Hanson, DOB: 26-Jun-1945  Phone: [phone] (home)       All above HIPAA information was verified with patient.     Was a Nurse, learning disability used for this call? No    Completed refill call assessment today to schedule patient's medication shipment from the [facility] Delivery Pharmacy  [phone]).  All relevant notes have been reviewed.     Specialty medication(s) and dose(s) confirmed: Regimen is correct and unchanged.   Changes to medications: Kristy Hanson reports no changes at this time.  Changes to insurance: No  New side effects reported not previously addressed with a pharmacist or physician: None reported  Questions for the pharmacist: No    Confirmed patient received a Conservation officer, historic buildings and a Surveyor, mining with first shipment. The patient will receive a drug information handout for each medication shipped and additional FDA Medication Guides as required.       DISEASE/MEDICATION-SPECIFIC INFORMATION        N/A    SPECIALTY MEDICATION ADHERENCE     Medication Adherence    Patient reported X missed doses in the last month: 0  Specialty Medication: tacrolimus 0.5mg   Patient is on additional specialty medications: No  Patient is on more than two specialty medications: No  Any gaps in refill history greater than 2 weeks in the last 3 months: no  Demonstrates understanding of importance of adherence: yes              Were doses missed due to medication being on hold? No    tacrolimus 0.5  mg: 7 days of medicine on hand       REFERRAL TO PHARMACIST     Referral to the pharmacist: Not needed      [facility]     Shipping address confirmed in Epic.       Delivery Scheduled: Yes, Expected medication delivery date: 04/06/23.     Medication will be delivered via UPS to the prescription address in Epic WAM.    Kristy Hanson   [facility] Delivery [facility]  Specialty Technician

## 2023-03-31 ENCOUNTER — Ambulatory Visit
Admission: RE | Admit: 2023-03-31 | Discharge: 2023-03-31 | Disposition: A | Discharge status: Home / Self Care | Payer: Medicare HMO | Source: Ambulatory Visit | Attending: Vascular Surgery | Admitting: Vascular Surgery

## 2023-03-31 DIAGNOSIS — I700 Atherosclerosis of aorta: Secondary | ICD-10-CM | POA: Diagnosis not present

## 2023-03-31 DIAGNOSIS — I728 Aneurysm of other specified arteries: Secondary | ICD-10-CM

## 2023-03-31 MED ORDER — IOPAMIDOL (ISOVUE-370) INJECTION 76%
200.0000 mL | Freq: Once | INTRAVENOUS | Status: AC | PRN
  Administered 2023-03-31: 75 mL via INTRAVENOUS

## 2023-04-05 DIAGNOSIS — Z5181 Encounter for therapeutic drug level monitoring: Principal | ICD-10-CM

## 2023-04-05 DIAGNOSIS — E612 Magnesium deficiency: Principal | ICD-10-CM

## 2023-04-05 DIAGNOSIS — Z944 Liver transplant status: Principal | ICD-10-CM

## 2023-04-05 MED FILL — TACROLIMUS 0.5 MG CAPSULE, IMMEDIATE-RELEASE: ORAL | 30 days supply | Qty: 180 | Fill #4

## 2023-04-12 DIAGNOSIS — Z944 Liver transplant status: Principal | ICD-10-CM

## 2023-04-12 DIAGNOSIS — Z5181 Encounter for therapeutic drug level monitoring: Principal | ICD-10-CM

## 2023-04-12 DIAGNOSIS — E612 Magnesium deficiency: Principal | ICD-10-CM

## 2023-04-12 NOTE — Progress Notes (Unsigned)
REASON FOR VISIT:   Follow-up of small splenic artery aneurysm  MEDICAL ISSUES:   SPLENIC ARTERY ANEURYSM: The patient's splenic artery aneurysm has not changed in size over the last year.  It was measured at 13 mm a year ago and on the most recent scan was measured at 12.4 mm.  Given that he had has been stable I think we can stretch her follow-up out to 18 months.  I have ordered a follow-up CT of the abdomen with IV contrast in 18 months and I will see her back at that time.  She knows to call sooner if she has problems.  I think it is very unlikely that this aneurysm will enlarge significantly.   HPI:   Candace Hill is a pleasant 77 y.o. female who I saw in consultation on 09/25/2020 with a splenic artery aneurysm.  This was a small 1.3 cm asymptomatic calcified splenic artery aneurysm.  I recommended a CT of the abdomen in 1 year.  I felt that if the aneurysm had not changed significantly in size we might stretch the follow-up out to 18 months.  Today she denies any abdominal pain or back pain.  There have been no significant changes in her medical history.  She is dealing with some depression.  She is not especially happy with the place where she is currently living and is considering other options.   Past Medical History:  Diagnosis Date   Acute cystitis without hematuria 03/12/2022   Allergic rhinitis 11/14/2012   Amblyopia of eye, left 06/17/2017   Asbestos exposure 04/08/2018   Asthma    [id number] due to black mold house, no asthma now   Broken ankle    right   Broken rib    x2   Cataract    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 03/26/2011   Chronic hepatitis C virus infection 05/31/2013   Complication of anesthesia    IV phenergan  serious mental reaction   Delusional thoughts 03/18/2022   Diarrhea 03/13/2022   Essential hypertension 10/14/2017   Gastroesophageal reflux disease 10/14/2017   Generalized anxiety disorder    Hernia,  femoral    left   History of blood transfusion 1971   acquired hepatitis c   History of kidney stones    History of liver transplant 11/14/2012   History of nephrolithiasis 10/14/2017   History of renal stone    History of shingles    Hyperglycemia 04/08/2018   Hyperlipidemia with target LDL less than 130 01/15/2021   The 10-year ASCVD risk score (Arnett DK, et al., 2019) is: 16.2%   Values used to calculate the score:     Age: 56 years     Sex: Female     Is Non-Hispanic African American: No     Diabetic: No     Tobacco smoker: No     Systolic Blood Pressure: 136 mmHg     Is BP treated: No     HDL Cholesterol: 51.2 mg/dL     Total Cholesterol: 202 mg/dL   Intrinsic eczema 46/96/2952   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-will get Avastin injections off/on   Vitamin D deficiency 04/08/2018   Weight loss, non-intentional 03/12/2022    Family History  Adopted: Yes  Problem Relation Age of Onset   Stroke Mother    Alzheimer's disease Mother    Cancer Mother    Diabetes Mother        Questionable, per new patient packet    Arthritis Mother        Questionable, per new patient packet    Alcoholism Father    Suicidality Sister    Depression Sister    COPD Sister    COPD Brother        Heavy smoker, per new patient packet    OCD Daughter    Personality disorder Daughter    Alcoholism Son    Bipolar disorder  Granddaughter        Age 59 as of 07/10/2020   Schizophrenia Granddaughter     SOCIAL HISTORY: Social History   Tobacco Use   Smoking status: Never   Smokeless tobacco: Never  Substance Use Topics   Alcohol use: No    Allergies  Allergen Reactions   Codeine Nausea Only   Other Other (See Comments)    Pt has complete Retina Vessel Occlusion - left eye.     Penicillins Nausea Only and Other (See Comments)    Has patient had a PCN reaction causing immediate rash, facial/tongue/throat swelling, SOB or lightheadedness with hypotension: No Has patient had a PCN reaction causing severe rash involving mucus membranes or skin necrosis: No Has patient had a PCN reaction that required hospitalization No Has patient had a PCN reaction occurring within the last 10 years: No If all of the above answers are "NO", then may proceed with Cephalosporin use.   Phenergan [Promethazine] Other (See Comments)    Reaction:  Hallucinations  Pt states that she is only allergic to IV form.    Sulfa Antibiotics Itching   Tamsulosin Itching   Lidocaine Palpitations    Current Outpatient Medications  Medication Sig Dispense Refill   aspirin EC 81 MG tablet Take 81 mg by mouth daily.     atorvastatin (LIPITOR) 10 MG tablet Take 1 tablet (10 mg total) by mouth daily. 90 tablet 0   CALCIUM-VITAMIN D PO Take 600 mg by mouth daily.     Ferrous Sulfate (IRON) 325 (65 Fe) MG TABS Take 1 tablet by mouth daily.     folic acid (FOLVITE) 400 MCG tablet Take 800 mcg by mouth daily.     memantine (NAMENDA) 5 MG tablet Take 1 tablet (5 mg at night) for 2 weeks, then increase to 1 tablet (5 mg) twice a day 180 tablet 0   olmesartan (BENICAR) 20 MG tablet Take 1 tablet (20 mg total) by mouth daily. 90 tablet 0   sertraline (ZOLOFT) 100 MG tablet Take 1 tablet (100 mg total) by mouth daily. Take with 50mg  tablet daily 30 tablet 3   sertraline (ZOLOFT) 50 MG tablet Take 1 tablet (50 mg total) by mouth daily. Take with  100mg  tablet daily. 30 tablet 3   tacrolimus (PROGRAF) 0.5 MG capsule 6 by mouth daily NAME BRAND ONLY     No current facility-administered medications for this visit.  REVIEW OF SYSTEMS:  [X]  denotes positive finding, [ ]  denotes negative finding Cardiac  Comments:  Chest pain or chest pressure:    Shortness of breath upon exertion:    Short of breath when lying flat:    Irregular heart rhythm:        Vascular    Pain in calf, thigh, or hip brought on by ambulation:    Pain in feet at night that wakes you up from your sleep:     Blood clot in your veins:    Leg swelling:         Pulmonary    Oxygen at home:    Productive cough:     Wheezing:         Neurologic    Sudden weakness in arms or legs:     Sudden numbness in arms or legs:     Sudden onset of difficulty speaking or slurred speech:    Temporary loss of vision in one eye:     Problems with dizziness:         Gastrointestinal    Blood in stool:     Vomited blood:         Genitourinary    Burning when urinating:     Blood in urine:        Psychiatric    Major depression:         Hematologic    Bleeding problems:    Problems with blood clotting too easily:        Skin    Rashes or ulcers:        Constitutional    Fever or chills:     PHYSICAL EXAM:   There were no vitals filed for this visit.   GENERAL: The patient is a well-nourished female, in no acute distress. The vital signs are documented above. CARDIAC: There is a regular rate and rhythm.  VASCULAR: I do not detect carotid bruits. She has palpable pedal pulses. PULMONARY: There is good air exchange bilaterally without wheezing or rales. ABDOMEN: Soft and non-tender with normal pitched bowel sounds.  MUSCULOSKELETAL: There are no major deformities or cyanosis. NEUROLOGIC: No focal weakness or paresthesias are detected. SKIN: There are no ulcers or rashes noted. PSYCHIATRIC: The patient has a normal affect.  DATA:    CT ANGIO ABDOMEN:  I have reviewed the CT angio of the abdomen and review the images.  There has been no change in the size of the splenic artery aneurysm which is 12 to 13 mm in maximum diameter.  The aneurysm is calcified.  Leonie Douglas [facility] [phone]

## 2023-04-13 ENCOUNTER — Encounter: Payer: Self-pay | Admitting: Vascular Surgery

## 2023-04-13 ENCOUNTER — Ambulatory Visit: Payer: Medicare HMO | Admitting: Vascular Surgery

## 2023-04-13 VITALS — BP 101/63 | HR 68 | Temp 97.9°F | Resp 20 | Ht 63.0 in | Wt 134.0 lb

## 2023-04-13 DIAGNOSIS — I728 Aneurysm of other specified arteries: Secondary | ICD-10-CM | POA: Diagnosis not present

## 2023-04-19 DIAGNOSIS — Z944 Liver transplant status: Principal | ICD-10-CM

## 2023-04-19 DIAGNOSIS — Z5181 Encounter for therapeutic drug level monitoring: Principal | ICD-10-CM

## 2023-04-19 DIAGNOSIS — E612 Magnesium deficiency: Principal | ICD-10-CM

## 2023-04-19 NOTE — Telephone Encounter (Signed)
Closing previous encounter  Denyce Robert, NP

## 2023-04-26 DIAGNOSIS — E612 Magnesium deficiency: Principal | ICD-10-CM

## 2023-04-26 DIAGNOSIS — Z944 Liver transplant status: Principal | ICD-10-CM

## 2023-04-26 DIAGNOSIS — Z5181 Encounter for therapeutic drug level monitoring: Principal | ICD-10-CM

## 2023-04-27 ENCOUNTER — Ambulatory Visit: Payer: Medicare HMO | Admitting: Internal Medicine

## 2023-04-27 ENCOUNTER — Encounter: Payer: Self-pay | Admitting: Internal Medicine

## 2023-04-27 VITALS — BP 112/70 | HR 67 | Temp 97.7°F | Resp 16 | Ht 63.0 in | Wt 138.2 lb

## 2023-04-27 DIAGNOSIS — E785 Hyperlipidemia, unspecified: Secondary | ICD-10-CM | POA: Diagnosis not present

## 2023-04-27 DIAGNOSIS — I10 Essential (primary) hypertension: Secondary | ICD-10-CM

## 2023-04-27 DIAGNOSIS — F01B4 Vascular dementia, moderate, with anxiety: Secondary | ICD-10-CM | POA: Diagnosis not present

## 2023-04-27 DIAGNOSIS — N1832 Chronic kidney disease, stage 3b: Secondary | ICD-10-CM

## 2023-04-27 DIAGNOSIS — Z23 Encounter for immunization: Secondary | ICD-10-CM | POA: Diagnosis not present

## 2023-04-27 MED ORDER — MEMANTINE HCL ER 21 MG PO CP24
21.0000 mg | ORAL_CAPSULE | Freq: Every day | ORAL | 0 refills | Status: DC
Start: 2023-04-27 — End: 2023-07-12

## 2023-04-27 NOTE — Progress Notes (Signed)
Subjective:  Patient ID: Candace Hill, female    DOB: 06-Dec-1945  Age: 77 y.o. MRN: [id number]  CC: Hyperlipidemia   HPI Candace Hill presents for f/up ---  Her daughter is concerned about her declining memory.  Outpatient Medications Prior to Visit  Medication Sig Dispense Refill   aspirin EC 81 MG tablet Take 81 mg by mouth daily.     atorvastatin (LIPITOR) 10 MG tablet Take 1 tablet (10 mg total) by mouth daily. 90 tablet 0   Ferrous Sulfate (IRON) 325 (65 Fe) MG TABS Take 1 tablet by mouth daily.     folic acid (FOLVITE) 400 MCG tablet Take 800 mcg by mouth daily.     olmesartan (BENICAR) 20 MG tablet Take 1 tablet (20 mg total) by mouth daily. 90 tablet 0   sertraline (ZOLOFT) 100 MG tablet Take 1 tablet (100 mg total) by mouth daily. Take with 50mg  tablet daily 30 tablet 3   sertraline (ZOLOFT) 50 MG tablet Take 1 tablet (50 mg total) by mouth daily. Take with 100mg  tablet daily. 30 tablet 3   tacrolimus (PROGRAF) 0.5 MG capsule 6 by mouth daily NAME BRAND ONLY     memantine (NAMENDA) 5 MG tablet Take 1 tablet (5 mg at night) for 2 weeks, then increase to 1 tablet (5 mg) twice a day 180 tablet 0   CALCIUM-VITAMIN D PO Take 600 mg by mouth daily. (Patient not taking: Reported on 04/27/2023)     No facility-administered medications prior to visit.    ROS Review of Systems  Constitutional:  Negative for diaphoresis and fatigue.  HENT: Negative.    Eyes: Negative.   Respiratory:  Negative for cough, chest tightness, shortness of breath and wheezing.   Cardiovascular:  Negative for chest pain, palpitations and leg swelling.  Gastrointestinal:  Negative for abdominal pain, diarrhea, nausea and vomiting.  Endocrine: Negative.   Genitourinary: Negative.  Negative for difficulty urinating.  Musculoskeletal: Negative.  Negative for arthralgias.  Skin: Negative.  Negative for color change.       She is concerned about a skin change on the right side of her face.   Neurological:  Negative for dizziness, weakness and headaches.  Hematological:  Negative for adenopathy. Does not bruise/bleed easily.  Psychiatric/Behavioral:  Positive for behavioral problems, confusion and decreased concentration. Negative for agitation, sleep disturbance and suicidal ideas. The patient is not nervous/anxious and is not hyperactive.     Objective:  BP 112/70   Pulse 67   Temp 97.7 F (36.5 C) (Oral)   Resp 16   Ht 5\' 3"  (1.6 m)   Wt 138 lb 3.2 oz (62.7 kg)   SpO2 98%   BMI 24.48 kg/m   BP Readings from Last 3 Encounters:  04/27/23 112/70  04/13/23 101/63  01/25/23 136/64    Wt Readings from Last 3 Encounters:  04/27/23 138 lb 3.2 oz (62.7 kg)  04/13/23 134 lb (60.8 kg)  01/25/23 135 lb (61.2 kg)    Physical Exam Vitals reviewed.  Constitutional:      Appearance: Normal appearance.  HENT:     Nose: Nose normal.     Mouth/Throat:     Mouth: Mucous membranes are moist.  Eyes:     General: No scleral icterus.    Conjunctiva/sclera: Conjunctivae normal.  Cardiovascular:     Rate and Rhythm: Normal rate and regular rhythm.     Heart sounds: No murmur heard. Pulmonary:     Effort: Pulmonary effort is  normal.     Breath sounds: No stridor. No wheezing, rhonchi or rales.  Abdominal:     General: Abdomen is flat.     Palpations: There is no mass.     Tenderness: There is no abdominal tenderness. There is no guarding.     Hernia: No hernia is present.  Musculoskeletal:        General: Normal range of motion.     Cervical back: Neck supple.     Right lower leg: No edema.     Left lower leg: No edema.  Lymphadenopathy:     Cervical: No cervical adenopathy.  Skin:    General: Skin is warm and dry.  Neurological:     General: No focal deficit present.     Mental Status: She is alert. Mental status is at baseline.     Lab Results  Component Value Date   WBC 10.2 01/25/2023   HGB 13.4 01/25/2023   HCT 40.1 01/25/2023   PLT 199.0 01/25/2023    GLUCOSE 93 04/27/2023   CHOL 161 01/25/2023   TRIG 132.0 01/25/2023   HDL 52.90 01/25/2023   LDLCALC 82 01/25/2023   ALT 9 04/27/2023   AST 16 04/27/2023   NA 139 04/27/2023   K 4.2 04/27/2023   CL 103 04/27/2023   CREATININE 1.27 (H) 04/27/2023   BUN 15 04/27/2023   CO2 28 04/27/2023   TSH 3.01 01/25/2023   INR 0.9 04/15/2021   HGBA1C 5.4 03/12/2022    CT ANGIO ABDOMEN W &/OR WO CONTRAST  Result Date: 04/09/2023 CLINICAL DATA:  Splenic artery aneurysm.  Portal vein aneurysm. EXAM: CT ANGIOGRAPHY ABDOMEN TECHNIQUE: Multidetector CT imaging of the abdomen was performed using the standard protocol during bolus administration of intravenous contrast. Multiplanar reconstructed images and MIPs were obtained and reviewed to evaluate the vascular anatomy. RADIATION DOSE REDUCTION: This exam was performed according to the departmental dose-optimization program which includes automated exposure control, adjustment of the mA and/or kV according to patient size and/or use of iterative reconstruction technique. CONTRAST:  75mL ISOVUE-370 IOPAMIDOL (ISOVUE-370) INJECTION 76% COMPARISON:  CT abdomen and pelvis 08/17/2022 FINDINGS: VASCULAR Aorta: Normal caliber aorta without aneurysm, dissection, vasculitis or significant stenosis. Calcified atherosclerotic disease again seen. Celiac: Peripherally calcified splenic artery aneurysm measures 12 mm, unchanged from prior. More peripheral calcified splenic artery aneurysms measure 5 mm and 6 mm, similar to prior. Otherwise, celiac artery and its branches are within normal limits. SMA: Patent without evidence of aneurysm, dissection, vasculitis or significant stenosis. Renals: Both renal arteries are patent without evidence of aneurysm, dissection, vasculitis, fibromuscular dysplasia or significant stenosis. Accessory right renal artery present. IMA: Patent without evidence of aneurysm, dissection, vasculitis or significant stenosis. Inflow: The visualized  common iliac arteries are within normal limits. Veins: No obvious venous abnormality within the limitations of this arterial phase study. Review of the MIP images confirms the above findings. NON-VASCULAR Lower chest: No acute abnormality. Hepatobiliary: Status post liver transplant. No focal liver abnormality is seen. Status post cholecystectomy. No biliary dilatation. Pancreas: Unremarkable. No pancreatic ductal dilatation or surrounding inflammatory changes. Spleen: Normal in size without focal abnormality. Adrenals/Urinary Tract: There is a 2.3 cm right renal cyst. There is no hydronephrosis or perinephric fat stranding. The adrenal glands are within normal limits. Stomach/Bowel: No dilated bowel loops are seen. No free intraperitoneal air. There is a small hiatal hernia. Lymphatic: No enlarged lymph nodes. Other: No ascites or focal abdominal wall hernia. Musculoskeletal: No acute or significant osseous findings. IMPRESSION: 1.  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 Aortic Atherosclerosis (ICD10-I70.0). NON-VASCULAR 1. No acute localizing process in the abdomen. 2. Status post liver transplant. 3. Small hiatal hernia. Electronically Signed   By: Darliss Cheney M.D.   On: 04/09/2023 00:46    Assessment & Plan:  Flu vaccine need -     Flu Vaccine Trivalent High Dose (Fluad)  Stage 3b chronic kidney disease- Her renal function is stable. -     Urinalysis, Routine w reflex microscopic; Future -     Basic metabolic panel; Future  Hyperlipidemia with target LDL less than 130 - LDL goal achieved. Doing well on the statin  -     Hepatic function panel; Future  Moderate vascular dementia with anxiety (HCC)- Will increase the dose of memantine. -     Memantine HCl ER; Take 1 capsule (21 mg total) by mouth daily.  Dispense: 90 capsule; Refill: 0 -     AMB Referral [facility] Care Management  Essential hypertension - Her BP is well controlled.     Follow-up: Return in about 3 months (around  07/28/2023).  Sanda Linger, MD

## 2023-04-27 NOTE — Patient Instructions (Signed)
 Chronic Kidney Disease, Adult Chronic kidney disease (CKD) occurs when the kidneys are slowly and permanently damaged over a long period of time. The kidneys are a pair of organs that do many important jobs in the body, including: Removing waste and extra fluid from the blood to make urine. Making hormones that maintain the amount of fluid in tissues and blood vessels. Maintaining the right amount of fluids and chemicals in the body. A small amount of kidney damage may not cause problems, but a large amount of damage may make it hard or impossible for the kidneys to work right. Steps must be taken to slow kidney damage or to stop it from getting worse. If steps are not taken, the kidneys may stop working permanently (end-stage renal disease, or ESRD). Most of the time, CKD does not go away, but it can often be controlled. People who have CKD are usually able to live full lives. What are the causes? The most common causes of this condition are diabetes and high blood pressure (hypertension). Other causes include: Cardiovascular diseases. These affect the heart and blood vessels. Kidney diseases. These include: Glomerulonephritis, or inflammation of the tiny filters in the kidneys. Interstitial nephritis. This is swelling of the small tubes of the kidneys and of the surrounding structures. Polycystic kidney disease, in which clusters of fluid-filled sacs form within the kidneys. Renal vascular disease. This includes disorders that affect the arteries and veins of the kidneys. Diseases that affect the body's defense system (immune system). A problem with urine flow. This may be caused by: Kidney stones. Cancer. An enlarged prostate, in males. A kidney infection or urinary tract infection (UTI) that keeps coming back. Vasculitis. This is swelling or inflammation of the blood vessels. What increases the risk? Your chances of having kidney disease increase with age. The following factors may make  you more likely to develop this condition: A family history of kidney disease or kidney failure. Kidney failure means the kidneys can no longer work right. Certain genetic diseases. Taking medicines often that are damaging to the kidneys. Being around or being in contact with toxic substances. Obesity. A history of tobacco use. What are the signs or symptoms? Symptoms of this condition include: Feeling very tired (lethargic) and having less energy. Swelling, or edema, of the face, legs, ankles, or feet. Nausea or vomiting, or loss of appetite. Confusion or trouble concentrating. Muscle twitches and cramps, especially in the legs. Dry, itchy skin. A metallic taste in the mouth. Producing less urine, or producing more urine (especially at night). Shortness of breath. Trouble sleeping. CKD may also result in not having enough red blood cells or hemoglobin in the blood (anemia) or having weak bones (bone disease). Symptoms develop slowly and may not be obvious until the kidney damage becomes severe. It is possible to have kidney disease for years without having symptoms. How is this diagnosed? This condition may be diagnosed based on: Blood tests. Urine tests. Imaging tests, such as an ultrasound or a CT scan. A kidney biopsy. This involves removing a sample of kidney tissue to be looked at under a microscope. Results from these tests will help to determine how serious the CKD is. How is this treated? There is no cure for most cases of this condition, but treatment usually relieves symptoms and prevents or slows the worsening of the disease. Treatment may include: Diet changes, which may require you to avoid alcohol and foods that are high in salt, potassium, phosphorous, and protein. Medicines. These may:  Lower blood pressure. Control blood sugar (glucose). Relieve anemia. Relieve swelling. Protect your bones. Improve the balance of salts and minerals in your blood  (electrolytes). Dialysis, which is a type of treatment that removes toxic waste from the body. It may be needed if you have kidney failure. Managing any other conditions that are causing your CKD or making it worse. Follow these instructions at home: Medicines Take over-the-counter and prescription medicines only as told by your health care provider. The amount of some medicines that you take may need to be changed. Do not take any new medicines unless approved by your health care provider. Many medicines can make kidney damage worse. Do not take any vitamin and mineral supplements unless approved by your health care provider. Many nutritional supplements can make kidney damage worse. Lifestyle  Do not use any products that contain nicotine or tobacco, such as cigarettes, e-cigarettes, and chewing tobacco. If you need help quitting, ask your health care provider. If you drink alcohol: Limit how much you use to: 0-1 drink a day for women who are not pregnant. 0-2 drinks a day for men. Know how much alcohol is in your drink. In the [place] one drink equals one 12 oz bottle of beer (355 mL), one 5 oz glass of wine (148 mL), or one 1 oz glass of hard liquor (44 mL). Maintain a healthy weight. If you need help, ask your health care provider. General instructions  Follow instructions from your health care provider about eating or drinking restrictions, including any prescribed diet. Track your blood pressure at home. Report changes in your blood pressure as told. If you are being treated for diabetes, track your blood glucose levels as told. Start or continue an exercise plan. Exercise at least 30 minutes a day, 5 days a week. Keep your immunizations up to date as told. Keep all follow-up visits. This is important. Where to find more information American Association of Kidney Patients: [employer].is [employer]: www.kidney.org American Kidney Fund: FightingMatch.com.ee Life Options:  www.lifeoptions.org Kidney School: www.kidneyschool.org Contact a health care provider if: Your symptoms get worse. You develop new symptoms. Get help right away if: You develop symptoms of ESRD. These include: Headaches. Numbness in your hands or feet. Easy bruising. Frequent hiccups. Chest pain. Shortness of breath. Lack of menstrual periods, in women. You have a fever. You are producing less urine than usual. You have pain or bleeding when you urinate or when you have a bowel movement. These symptoms may represent a serious problem that is an emergency. Do not wait to see if the symptoms will go away. Get medical help right away. Call your local emergency services (911 in the [place] Do not drive yourself to the hospital. Summary Chronic kidney disease (CKD) occurs when the kidneys become damaged slowly over a long period of time. The most common causes of this condition are diabetes and high blood pressure (hypertension). There is no cure for most cases of CKD, but treatment usually relieves symptoms and prevents or slows the worsening of the disease. Treatment may include a combination of lifestyle changes, medicines, and dialysis. This information is not intended to replace advice given to you by your health care provider. Make sure you discuss any questions you have with your health care provider. Document Revised: 09/01/2019 Document Reviewed: 09/06/2019 Elsevier Patient Education  2024 [employer].

## 2023-04-28 ENCOUNTER — Telehealth: Payer: Self-pay | Admitting: Physician Assistant

## 2023-04-28 LAB — HEPATIC FUNCTION PANEL
ALT: 9 U/L (ref 0–35)
AST: 16 U/L (ref 0–37)
Albumin: 4.5 g/dL (ref 3.5–5.2)
Alkaline Phosphatase: 60 U/L (ref 39–117)
Bilirubin, Direct: 0.1 mg/dL (ref 0.0–0.3)
Total Bilirubin: 0.5 mg/dL (ref 0.2–1.2)
Total Protein: 7.2 g/dL (ref 6.0–8.3)

## 2023-04-28 LAB — BASIC METABOLIC PANEL
BUN: 15 mg/dL (ref 6–23)
CO2: 28 meq/L (ref 19–32)
Calcium: 9.2 mg/dL (ref 8.4–10.5)
Chloride: 103 meq/L (ref 96–112)
Creatinine, Ser: 1.27 mg/dL — ABNORMAL HIGH (ref 0.40–1.20)
GFR: 41.36 mL/min — ABNORMAL LOW (ref >60.00)
Glucose, Bld: 93 mg/dL (ref 70–99)
Potassium: 4.2 meq/L (ref 3.5–5.1)
Sodium: 139 meq/L (ref 135–145)

## 2023-04-28 NOTE — Telephone Encounter (Signed)
Pt's daughter called in concerned about the pt. The pt's PCP is concerned as well and wants her to speak with our office.

## 2023-04-29 LAB — URINALYSIS, ROUTINE W REFLEX MICROSCOPIC
Bilirubin Urine: NEGATIVE
Ketones, ur: NEGATIVE
Leukocytes,Ua: NEGATIVE
Nitrite: NEGATIVE
Specific Gravity, Urine: 1.02 (ref 1.000–1.030)
Total Protein, Urine: NEGATIVE
Urine Glucose: NEGATIVE
Urobilinogen, UA: 0.2 (ref 0.0–1.0)
pH: 6 (ref 5.0–8.0)

## 2023-04-29 NOTE — Telephone Encounter (Signed)
She was placed on schedule

## 2023-04-30 ENCOUNTER — Ambulatory Visit: Payer: Medicare HMO | Admitting: Licensed Clinical Social Worker

## 2023-05-01 NOTE — Patient Outreach (Signed)
  Care Coordination   Follow Up Visit Note   05/01/2023 Name: Candace Hill MRN: [id number] DOB: 06-04-1946  Candace Hill is a 77 y.o. year old female who sees Etta Grandchild, MD for primary care. I spoke with  April Steeman  by phone today.  What matters to the patients health and wellness today?  Candace Hill reports concerns with Candace Hill living alone due to memory loss concerns.     Goals Addressed             This Visit's Progress    Care Coordination       Activities and task to complete in order to accomplish goals.   Self Support options  (consider day centers and [place] to assist with socialization ) Contact mental health provider "[facility] for counseling options and insurance network participation [phone]   Discuss safety options and possible move with Candace Hill.          SDOH assessments and interventions completed:  Yes  SDOH Interventions Today    Flowsheet Row Most Recent Value  SDOH Interventions   Food Insecurity Interventions Intervention Not Indicated  Transportation Interventions Intervention Not Indicated  Utilities Interventions Intervention Not Indicated        Care Coordination Interventions:  Yes, provided  Interventions Today    Flowsheet Row Most Recent Value  Chronic Disease   Chronic disease during today's visit Other  [Memory loss]  General Interventions   General Interventions Discussed/Reviewed Level of Care  Level of Care Personal Care Services  [Candace Hill reports concerns regarding Candace Hill living alone. Candace Hill reports she lives less than a mile from Candace Hill. Candace Hill prefers to have mother live with her.]        Follow up plan: Follow up call scheduled for 05/20/2023    Encounter Outcome:  Patient Visit Completed   Gwyndolyn Saxon MSW, LCSW Licensed Clinical Social Worker  [facility], Population [facility] Direct Dial: [phone]  Fax:  [phone]

## 2023-05-03 ENCOUNTER — Ambulatory Visit: Payer: Medicare HMO | Admitting: Physician Assistant

## 2023-05-03 ENCOUNTER — Encounter: Payer: Self-pay | Admitting: Physician Assistant

## 2023-05-03 VITALS — BP 126/66 | HR 73 | Resp 18 | Ht 63.0 in | Wt 136.0 lb

## 2023-05-03 DIAGNOSIS — G3184 Mild cognitive impairment, so stated: Secondary | ICD-10-CM

## 2023-05-03 DIAGNOSIS — F0670 Mild neurocognitive disorder due to known physiological condition without behavioral disturbance: Secondary | ICD-10-CM | POA: Diagnosis not present

## 2023-05-03 DIAGNOSIS — Z5181 Encounter for therapeutic drug level monitoring: Principal | ICD-10-CM

## 2023-05-03 DIAGNOSIS — E612 Magnesium deficiency: Principal | ICD-10-CM

## 2023-05-03 DIAGNOSIS — Z944 Liver transplant status: Principal | ICD-10-CM

## 2023-05-03 NOTE — Progress Notes (Signed)
Assessment/Plan:   Mild Cognitive Impairment of multiple etiologies.   Candace Hill is a very pleasant 77 y.o. 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  Note that MCI is the correct diagnosis. 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 She has a neuropsych evaluation in the near future for clarity of diagnosis. Patient is on memantine 10 mg bid, increased by her PCP.  MMSE is 28/30, stable from prior. Once again, I had a long discussion with the patient and her daughter.  Depression may be playing a significant role in her memory.  She is seeing behavioral health.  Her daughter would like her mother to move with her for increasing socialization as she is isolated at this time.  I agree that she should be interacting with people and find other activities outside of her home.   Recommendations:   Follow up in 6  months. Continue Memantine 10 mg twice daily. Side effects were discussed  Patient scheduled for Neuropsych testing for clarity of diagnosis and disease trajectory in the near future    Continue B12 supplements  Recommend good control of cardiovascular risk factors Continue behavioral health and psychotherapy for MDD. Patient on Zoloft. Increase socialization, physical activity      Subjective:   This patient is accompanied in the office by her daughter  who supplements the history. Previous  records as well as any outside records available were reviewed prior to todays visit.   Patient was last seen on 11/11/22 with MMSE of 29/30.       Any changes in memory since last visit? "Worse". Patient has more difficulty remembering recent conversations and people names, how to charge the phone, dates, etc. She does not have much interest in interacting socially, as before reported. She sees a Airline pilot. She does not like doing brain games.  She watches the news most of the time and she admits to be upset abut the results of the last election, "it affected me"-she says. She used to enjoy going to [place] but has not gone in a while repeats oneself?  Occasionally  Disoriented when walking into a room?  Patient denies    Misplacing objects?  Patient denies, "one day she could not figure out how to open the doors "-her daughter says.    Wandering behavior?   Denies. "I stay inside".   Any personality changes since last visit? Denies    Any worsening depression?: Not yet controlled. She cries frequently. Sees BH.   Hallucinations or paranoia?  Denies.   Seizures?   Denies.    Any sleep changes? Sleeps well, gets up later. Denies vivid dreams, REM behavior or sleepwalking.    Sleep apnea?   Denies.    Any hygiene concerns?   denies   Independent of bathing and dressing?  Endorsed  Does the patient needs help with medications? Patient is in charge. Daughter days she is missing doses, she has been having issues placing them in the box.   Who is  in charge of the finances?  Patient is in charge.     Any changes in appetite?  Denies.     Patient have trouble swallowing?  Denies.   Does the patient cook?  Not much   Any kitchen accidents such as leaving the stove on?  She left the stove on the other day.  Any headaches?   She has a history of migraines.  Vision changes? L eye gradually worse, needs an appt with ophthalmologist.  Chronic pain?  denies   Ambulates with difficulty?   Does not  like walking frequently.    Recent falls or head injuries?    denies      Unilateral weakness, numbness or tingling?   denies   Any tremors?  Denies.   Any anosmia?  Denies.   Any incontinence of urine?  Denies. Uses pads to prevent "accidents".   Any bowel dysfunction?  denies      Patient lives alone at [facility] [address] in [place].  Does the patient drive? No longer drives, struggling with this issue, adds to her depression.        Initial Visit Dr. [name] 7/7/21This is a pleasant 77 year old right-handed woman with a history of hypertension, hepatitis C, liver cancer s/p liver transplant, left retinal vein occlusion, TIA, presenting for evaluation of memory loss. She is verbose and slightly tangential. She saw her PCP in January 2021 reporting that her nurse friend is concerned about her memory. She was told she should be getting more things done and is having trouble with this. She was in the process of moving and was having a hard time, she moved last December. She says  "I've always pushed myself too much." She has noticed memory changes herself, she cannot remember things in a row. 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 She lives alone. She denies getting lost driving. She denies missing medications. She has to juggle things so she could keep up with things in general. MMSE 28/30 in July 2020. She now lives in senior independent living at [facility]. She states she was adopted, and that her mother had Alzheimer's disease. She denies any significant head injuries. No alcohol use.    She is supposed to follow-up in [facility] for her liver transplant, but keeps saying that [facility] screwed up. She states "they went me the fake antirejection medication" and Medicare called her about it, she was sent the generic that is cheaper. She reports left retinal vein occlusion as a result of her steroids for her transplant. She has  7% vision on the left. She recalls and ruminates on a fall in February 2020 when she was in the bank and broke her nose and 2 front teeth, saying "someone caused me to have an accident." She states she has not been okay since then. She falls more, feeling dizzy with spinning sensation. She cannot climb steps because she gets very dizzy. She reports an incident last week on the phone, she could not understand anything she was trying to say, gibberish was coming out. This lasted a few hours, the next morning she started to talk. She recalls having a headache at that time. She denied any focal weakness. She started B12 supplements in March 2021, B12 level was 248.     Neurocognitive testing 9 06/17/2019, Dr. Milbert Coulter results suggested deficits primarily surrounding phonemic fluency and encoding/retrieval aspects of memory. Performance variability was further  noted across processing speed, executive functioning, and retention/consolidation aspects of memory. Regarding etiology, the most likely culprit for ongoing cognitive dysfunction is vascular in nature (i.e., mild vascular neurocognitive disorder). There appears to be some confusion regarding Candace Hill's stroke history. She was quite adamant that she was told that she has experienced three "mini-strokes" in her lifetime and that these were previously confirmed by a un-named physician. The term "mini-stroke" is problematic due to its imprecision and likely should not be used in clinical capacities as it is unclear if this represents prior TIAs (i.e., temporary blockages that can result in short-term stroke-like symptoms which do not show changes on neuroimaging) or actual very small strokes. A brain MRI on 01/16/2015 did reveal a small focal lesion in the right genu of the corpus callosum which could represent a very small ischemic infarct. This lesion is visible on her more recent brain MRI (01/21/2020) but is not commented on. Looking at her 2016 MRI, it is also worth  pointing out the potential for an additional small left parieto-temporal infarct around the area of the angular gyrus which would be chronic in nature. Overall, while unclear, there does seem to be the potential that Candace Hill has experienced at least one very small stroke in the past.       TSH 2.98 09/06/2019          Neuropsychological evaluation, 03/18/2022, Dr. Milbert Coulter briefly, results suggested primary impairments surrounding semantic fluency, receptive language, and all aspects of verbal learning and memory. Additional performance variability was exhibited across processing speed, executive functioning, phonemic fluency, and visual learning and memory. Relative to her previous evaluation in August 2021, performance declines were exhibited across several domains. This was most prominent across executive functioning, receptive language, and semantic fluency. Declines were also evidence across aspects of memory, particularly delayed retrieval aspects of verbal memory tasks. Other assessed domains generally exhibited stability. The etiology of ongoing dysfunction is unclear as patterns across testing continue to be somewhat nonspecific. Previously, I theorized a primary vascular contribution given medical ailments, microvascular ischemic disease, and prior lacunar infarct. A vascular contribution certainly remains a strong possibility. However, generally speaking, this would not yield progressively worsening cognitive abilities with no reported vascular or other medical event in the interim. The presence of an underlying neurodegenerative condition remains a possibility. Of these illnesses, Alzheimer's disease would be most likely. Across verbal memory tasks, she did not benefit from repeated exposure to information, exhibited prominent difficulties recalling previously learned information after a brief delay, and performed very poorly across yes/no recognition trials. This could suggest rapid forgetting and  an evolving storage impairment, which are hallmark signs of this illness. Impairment and progressive decline in semantic fluency would represent typical disease progression. However, visual memory scores remained generally adequate and performance across confrontation naming was also adequate. While concerns are reasonable, she does not exhibit a typical Alzheimer's disease profile across testing at the present time.     Personally reviewed CT of the head 04/23/2022 shows unchanged chronic small vessel ischemic change. Tiny remote lacunar infarcts in the right basal ganglia and caudate.  Past Medical History:  Diagnosis Date   Acute cystitis without hematuria 03/12/2022   Allergic rhinitis 11/14/2012   Amblyopia of eye, left 06/17/2017   Asbestos exposure 04/08/2018   Asthma    1994-1994 due to black mold house, no asthma now   Broken ankle    right   Broken rib    x2   Cataract  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 03/26/2011   Chronic hepatitis C virus infection 05/31/2013   Complication of anesthesia    IV phenergan  serious mental reaction   Delusional thoughts 03/18/2022   Diarrhea 03/13/2022   Essential hypertension 10/14/2017   Gastroesophageal reflux disease 10/14/2017   Generalized anxiety disorder    Hernia, femoral    left   History of blood transfusion 1971   acquired hepatitis c   History of kidney stones    History of liver transplant 11/14/2012   History of nephrolithiasis 10/14/2017   History of renal stone    History of shingles    Hyperglycemia 04/08/2018   Hyperlipidemia with target LDL less than 130 01/15/2021   The 10-year ASCVD risk score (Arnett DK, et al., 2019) is: 16.2%   Values used to calculate the score:     Age: 61 years     Sex: Female     Is Non-Hispanic African American: No     Diabetic: No     Tobacco smoker: No     Systolic Blood Pressure: 136 mmHg     Is BP treated: No     HDL Cholesterol: 51.2 mg/dL     Total  Cholesterol: 202 mg/dL   Intrinsic eczema 16/03/9603   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-will get Avastin injections off/on   Vitamin D deficiency 04/08/2018   Weight loss, non-intentional 03/12/2022     Past Surgical History:  Procedure Laterality Date   CATARACT EXTRACTION Bilateral    CHOLECYSTECTOMY  1997   CYSTOSCOPY W/ URETERAL STENT PLACEMENT Left 08/14/2016   Procedure: CYSTOSCOPY WITH RETROGRADE PYELOGRAM/URETERAL LEFT URETEROSCOPY AND LEFT STENT PLACEMENT;  Surgeon: Heloise Purpura, MD;  Location: [facility];  Service: Urology;  Laterality: Left;   CYSTOSCOPY/URETEROSCOPY/HOLMIUM LASER/STENT PLACEMENT Left 07/06/2016   Procedure: CYSTOSCOPY/URETEROSCOPY/ BILATERAL RETROGRADE/HOLMIUM LASER/STENT PLACEMENT/BASKET STONE REMOVAL;  Surgeon: Heloise Purpura, MD;  Location: [facility];  Service: Urology;  Laterality: Left;   CYSTOSCOPY/URETEROSCOPY/HOLMIUM LASER/STENT PLACEMENT Left 08/06/2016   Procedure: CYSTOSCOPY/URETEROSCOPY/HOLMIUM LASER/STENT PLACEMENT;  Surgeon: Heloise Purpura, MD;  Location: [facility];  Service: Urology;  Laterality: Left;   CYSTOSCOPY/URETEROSCOPY/HOLMIUM LASER/STENT PLACEMENT Left 08/31/2016   Procedure: CYSTOSCOPY/URETEROSCOPY/HOLMIUM LASER/STENT PLACEMENT/BASKET STONE REMOVAL;  Surgeon: Heloise Purpura, MD;  Location: [facility];  Service: Urology;  Laterality: Left;   FEMORAL HERNIA REPAIR Left 1997   left  leg-   LIVER TRANSPLANTATION     NASAL SINUS SURGERY  1997   x3   TONSILLECTOMY  age 58   TUBAL LIGATION       PREVIOUS MEDICATIONS:   CURRENT MEDICATIONS:  Outpatient Encounter Medications as of 05/03/2023  Medication Sig   aspirin EC 81 MG tablet Take 81 mg by mouth daily.   atorvastatin (LIPITOR) 10 MG tablet Take 1 tablet (10 mg total) by mouth daily.   CALCIUM-VITAMIN D PO Take 600 mg by mouth  daily. (Patient not taking: Reported on 04/27/2023)   Ferrous Sulfate (IRON) 325 (65 Fe) MG TABS Take 1 tablet by mouth daily.   folic acid (FOLVITE) 400 MCG tablet Take 800 mcg by mouth daily.   memantine (NAMENDA XR) 21 MG CP24 24 hr capsule Take 1 capsule (21 mg total) by mouth daily.   olmesartan (BENICAR) 20 MG tablet Take 1 tablet (20 mg total) by mouth daily.   sertraline (ZOLOFT) 100 MG tablet Take 1 tablet (100 mg total) by mouth daily. Take with 50mg  tablet daily   sertraline (ZOLOFT) 50 MG tablet Take 1 tablet (50 mg total) by mouth daily. Take with 100mg  tablet daily.   tacrolimus (PROGRAF) 0.5 MG capsule 6 by mouth daily NAME BRAND ONLY   No facility-administered encounter medications on file as of 05/03/2023.     Objective:     PHYSICAL EXAMINATION:    VITALS:   Vitals:   05/03/23 0851  BP: 126/66  Pulse: 73  Resp: 18  SpO2: 96%  Weight: 136 lb (61.7 kg)  Height: 5\' 3"  (1.6 m)    GEN:  The patient appears stated age and is in NAD. HEENT:  Normocephalic, atraumatic.   Neurological examination:  General: NAD, well-groomed, appears stated age. Orientation: The patient is alert. Oriented to person, place and date Cranial nerves: There is good facial symmetry. Tearful. The speech is fluent and clear, tangential. No aphasia or dysarthria. Fund of knowledge is appropriate. Recent memory impaired and remote memory is normal.  Attention and concentration are normal.  Able to name objects and repeat phrases.  Hearing is intact to conversational tone.   Delayed recall  2/3 Sensation: Sensation is intact to light touch throughout Motor: Strength is at least antigravity x4. DTR's 2/4 in UE/LE      07/29/2021   11:00 AM  Montreal Cognitive Assessment   Visuospatial/ Executive (0/5) 4  Naming (0/3) 3  Attention: Read list of digits (0/2) 2  Attention: Read list of letters (0/1) 1  Attention: Serial 7 subtraction starting at 100 (0/3) 1  Language: Repeat phrase (0/2) 2  Language : Fluency (0/1) 1  Abstraction (0/2) 2  Delayed Recall (0/5) 0  Orientation (0/6) 6  Total 22  Adjusted Score (based on education) 22       05/03/2023   12:00 PM 11/11/2022    4:00 PM 12/23/2018    9:09 AM  MMSE - Mini Mental State Exam  Orientation to time 4 5 4   Orientation to Place 5 5 4   Registration 3 3 3   Attention/ Calculation 5 5 5   Recall 2 2 3   Language- name 2 objects 2 2 2   Language- repeat 1 1 1   Language- follow 3 step command 3 3 3   Language- read & follow direction 1 1 1   Write a sentence 1 1 1   Copy design 1 1 1   Total score 28 29 28        Movement examination: Tone: There is normal tone in the UE/LE Abnormal movements:  no tremor.  No myoclonus.  No asterixis.   Coordination:  There is no decremation with RAM's. Normal finger to nose  Gait and Station: The patient has no difficulty arising out of a deep-seated chair without the use of the hands. The patient's stride length is good.  Gait is cautious and narrow.   Thank you for allowing [place] the opportunity to participate in the care of this nice patient. Please do not hesitate to contact us for  any questions or concerns.   Total time spent on today's visit was 41 minutes dedicated to this patient today, preparing to see patient, examining the patient, ordering tests and/or medications and counseling the patient, documenting clinical information in the EHR or other health record, independently interpreting results and communicating results to the patient/family, discussing treatment and goals,  answering patient's questions and coordinating care.  Cc:  Etta Grandchild, MD  Marlowe Kays 05/03/2023 12:24 PM

## 2023-05-05 ENCOUNTER — Telehealth: Payer: Self-pay | Admitting: Physician Assistant

## 2023-05-05 NOTE — Telephone Encounter (Signed)
Wanted to let Marlowe Kays ,PA-C. She has some family issues, going on and daughter is not able to take her mother in at this time. Wanted to share with you. Thank you

## 2023-05-05 NOTE — Telephone Encounter (Signed)
Caller stated she would like to speak with [facility]. Regarding important information she needs [place] to know that she could not discuss the day of her appointment due to someone being with her.

## 2023-05-10 DIAGNOSIS — Z944 Liver transplant status: Principal | ICD-10-CM

## 2023-05-10 DIAGNOSIS — Z5181 Encounter for therapeutic drug level monitoring: Principal | ICD-10-CM

## 2023-05-10 DIAGNOSIS — E612 Magnesium deficiency: Principal | ICD-10-CM

## 2023-05-10 NOTE — Unmapped (Signed)
[facility] Refill Coordination Note    Specialty Medication(s) to be Shipped:   Transplant: tacrolimus 0.5mg     Other medication(s) to be shipped: No additional medications requested for fill at this time     Kristy Hanson, DOB: [birth year]  Phone: [phone] (home)       All above HIPAA information was verified with patient.     Was a Nurse, learning disability used for this call? No    Completed refill call assessment today to schedule patient's medication shipment from the [facility]  [phone]).  All relevant notes have been reviewed.     Specialty medication(s) and dose(s) confirmed: Regimen is correct and unchanged.   Changes to medications: Lessli reports no changes at this time.  Changes to insurance: No  New side effects reported not previously addressed with a pharmacist or physician: None reported  Questions for the pharmacist: No    Confirmed patient received a Conservation officer, historic buildings and a Surveyor, mining with first shipment. The patient will receive a drug information handout for each medication shipped and additional FDA Medication Guides as required.       DISEASE/MEDICATION-SPECIFIC INFORMATION        N/A    SPECIALTY MEDICATION ADHERENCE     Medication Adherence    Patient reported X missed doses in the last month: 0  Specialty Medication: tacrolimus 0.5 MG capsule (PROGRAF)  Patient is on additional specialty medications: No              Were doses missed due to medication being on hold? No    tacrolimus 0.5  mg: 7 days of medicine on hand       REFERRAL TO PHARMACIST     Referral to the pharmacist: Not needed      [facility]     Shipping address confirmed in Epic.       Delivery Scheduled: Yes, Expected medication delivery date: 05/18/23.     Medication will be delivered via UPS to the prescription address in Epic WAM.    [facility]  Specialty Technician

## 2023-05-10 NOTE — Unmapped (Signed)
The [facility] has made a second and final attempt to reach this patient to refill the following medication:PROGRAF.      We have left voicemails on the following phone numbers: [phone] and have sent a MyChart message.    Dates contacted: 11/19, 11/25  Last scheduled delivery: 10/21 for 30ds    The patient may be at risk of non-compliance with this medication. The patient should call the [facility] at [phone]  Option 4, then Option 4: Infectious Disease, Transplant to refill medication.    Thad Ranger, PharmD   [facility] Specialty Pharmacist

## 2023-05-15 ENCOUNTER — Other Ambulatory Visit: Payer: Self-pay | Admitting: Internal Medicine

## 2023-05-15 DIAGNOSIS — I10 Essential (primary) hypertension: Secondary | ICD-10-CM

## 2023-05-15 DIAGNOSIS — E785 Hyperlipidemia, unspecified: Secondary | ICD-10-CM

## 2023-05-17 DIAGNOSIS — E612 Magnesium deficiency: Principal | ICD-10-CM

## 2023-05-17 DIAGNOSIS — Z5181 Encounter for therapeutic drug level monitoring: Principal | ICD-10-CM

## 2023-05-17 DIAGNOSIS — Z944 Liver transplant status: Principal | ICD-10-CM

## 2023-05-17 MED FILL — TACROLIMUS 0.5 MG CAPSULE, IMMEDIATE-RELEASE: ORAL | 30 days supply | Qty: 180 | Fill #5

## 2023-05-17 NOTE — Unmapped (Signed)
[facility] Clinical Intervention    Type of intervention: Narrow therapeutic index drug    Medication involved: tacrolimus    Problem identified: mfg change per War alignment    Intervention performed: pt and clinic ok with switch. Pt aware to alert clinic on switch date as bloodwork may be needed    Follow-up needed: clinic aware    Approximate time spent: 5-10 minutes    Clinical evidence used to support intervention: FDA Orange Book    Result of the intervention: Prevention of an adverse drug event    Thad Ranger, PharmD   [facility] Specialty Pharmacist    [facility] Clinical Assessment & Refill Coordination Note    Kristy Hanson, DOB: 07-20-1945  Phone: [phone] (home)     All above HIPAA information was verified with patient.     Was a Nurse, learning disability used for this call? No    Specialty Medication(s):   Transplant: tacrolimus 0.5mg      Current Outpatient Medications   Medication Sig Dispense Refill    amlodipine (NORVASC) 2.5 MG tablet Take 1 tablet (2.5 mg total) by mouth daily.      aspirin (ECOTRIN) 81 MG tablet Take 1 tablet (81 mg total) by mouth daily.      atorvastatin (LIPITOR) 10 MG tablet Take 1 tablet (10 mg total) by mouth daily.      biotin 1,000 mcg Chew Chew 1,000 mcg  in the morning.      ferrous sulfate 325 (65 FE) MG tablet Take 1 tablet (325 mg total) by mouth in the morning.      memantine (NAMENDA) 5 MG tablet Take 1 tablet (5 mg total) by mouth two (2) times a day.      psyllium (METAMUCIL) 3.4 gram packet Take 1 packet by mouth daily as needed (constipation).      sertraline (ZOLOFT) 100 MG tablet Take 1 tablet (100 mg total) by mouth daily.      tacrolimus (PROGRAF) 0.5 MG capsule Take 3 capsules (1.5 mg total) by mouth two (2) times a day. 540 capsule 4     No current facility-administered medications for this visit.        Changes to medications: Tomaka reports no changes at this time.    Allergies   Allergen Reactions    Avastin [Bevacizumab] Other (See Comments)     Mini stroke    Prednisone Other (See Comments)     Patient was told by her ophthalmologist that the vision loss she had in one of her eyes was likely a result of high dose steroids. She also reports insomnia/jitters when taking it.    Amoxicillin-Pot Clavulanate      Other reaction(s): NAUSEA    Codeine      Other reaction(s): NAUSEA    Penicillins      Other reaction(s): UNKNOWN  * Pt would like to know why she shouldn't take this*    Procaine      Other reaction(s): dizziness    Promethazine      oversedation    Sulfa (Sulfonamide Antibiotics)      Other reaction(s): ITCHING    Lidocaine Palpitations    Tamsulosin Itching       Changes to allergies: No    SPECIALTY MEDICATION ADHERENCE     Tacrolimus 0.5mg   : 2 days of medicine on hand       Medication Adherence    Patient  reported X missed doses in the last month: 0  Specialty Medication: tacrolimus 0.5mg   Patient is on additional specialty medications: No          Specialty medication(s) dose(s) confirmed: Regimen is correct and unchanged.     Are there any concerns with adherence? No    Adherence counseling provided? Not needed    CLINICAL MANAGEMENT AND INTERVENTION      Clinical Benefit Assessment:    Do you feel the medicine is effective or helping your condition? Yes    Clinical Benefit counseling provided? Not needed    Adverse Effects Assessment:    Are you experiencing any side effects? No    Are you experiencing difficulty administering your medicine? No    Quality of Life Assessment:    Quality of Life    Rheumatology  Oncology  Dermatology  Cystic Fibrosis          How many days over the past month did your transplant  keep you from your normal activities? For example, brushing your teeth or getting up in the morning. 0    Have you discussed this with your provider? Not needed    Acute Infection Status:    Acute infections noted within Epic:  Rule Out C. Diff  Patient reported infection: None    Therapy Appropriateness:    Is therapy appropriate based on current medication list, adverse reactions, adherence, clinical benefit and progress toward achieving therapeutic goals? Yes, therapy is appropriate and should be continued     DISEASE/MEDICATION-SPECIFIC INFORMATION      N/A    Solid Organ Transplant: Not Applicable    PATIENT SPECIFIC NEEDS     Does the patient have any physical, cognitive, or cultural barriers? No    Is the patient high risk? No    Did the patient require a clinical intervention? No    Does the patient require physician intervention or other additional services (i.e., nutrition, smoking cessation, social work)? No    SOCIAL DETERMINANTS OF HEALTH     At the [facility], we have learned that life circumstances - like trouble affording food, housing, utilities, or transportation can affect the health of many of our patients.   That is why we wanted to ask: are you currently experiencing any life circumstances that are negatively impacting your health and/or quality of life? Patient declined to answer    Social Drivers of Health     Food Insecurity: No Food Insecurity (03/10/2021)    Received from [facility], [facility]    Hunger Vital Sign     Worried About Running Out of Food in the Last Year: Never true     Ran Out of Food in the Last Year: Never true   Internet Connectivity: Not on file   Housing/Utilities: Not on file   Tobacco Use: Low Risk  (11/26/2022)    Patient History     Smoking Tobacco Use: Never     Smokeless Tobacco Use: Never     Passive Exposure: Not on file   Transportation Needs: No Transportation Needs (03/10/2021)    Received from [facility], [facility]    [facility] - Transportation     Lack of Transportation (Medical): No     Lack of Transportation (Non-Medical): No   Alcohol Use: Not on file   Interpersonal Safety: Not on file   Physical Activity: Insufficiently Active (03/10/2021)    Received from [facility], [facility] Exercise Vital Sign  Days of Exercise per Week: 1 day     Minutes of Exercise per Session: 10 min   Intimate Partner Violence: Not At Risk (03/10/2021)    Received from [facility], [facility]    Humiliation, Afraid, Rape, and Kick questionnaire     Fear of Current or Ex-Partner: No     Emotionally Abused: No     Physically Abused: No     Sexually Abused: No   Stress: Stress Concern Present (03/10/2021)    Received from [facility]    [facility] - Occupational Stress Questionnaire     Feeling of Stress : Very much   Substance Use: Not on file (04/24/2023)   Social Connections: Socially Isolated (03/10/2021)    Received from [facility], [facility]    Social Connection and Isolation Panel [NHANES]     Frequency of Communication with Friends and Family: Never     Frequency of Social Gatherings with Friends and Family: Never     Attends Religious Services: Never     Database administrator or Organizations: No     Attends Banker Meetings: Never     Marital Status: Divorced   Programmer, applications: Low Risk  (03/10/2021)    Received from [facility], [facility]    Overall Financial Resource Strain (CARDIA)     Difficulty of Paying Living Expenses: Not very hard   Depression: Not at risk (07/26/2018)    Received from [facility] visits prior to 08/15/2022., [facility] visits prior to 08/15/2022.    PHQ-2     PHQ-2 Score: 0   Health Literacy: Not on file       Would you be willing to receive help with any of the needs that you have identified today? Not applicable       SHIPPING     Specialty Medication(s) to be Shipped:   N/a    Other medication(s) to be shipped: No additional medications requested for fill at this time     Changes to insurance: No    Delivery Scheduled:  delivery already set up by another team member      Medication will be delivered via  n/a  to the confirmed  n/a  address in [facility].    The patient will receive a drug information handout for each medication shipped and additional FDA Medication Guides as required.  Verified that patient has previously received a Conservation officer, historic buildings and a Surveyor, mining.    The patient or caregiver noted above participated in the development of this care plan and knows that they can request review of or adjustments to the care plan at any time.      All of the patient's questions and concerns have been addressed.    Thad Ranger, PharmD   [facility] Specialty Pharmacist

## 2023-05-18 ENCOUNTER — Ambulatory Visit: Payer: Medicare Other | Admitting: Physician Assistant

## 2023-05-21 ENCOUNTER — Ambulatory Visit (INDEPENDENT_AMBULATORY_CARE_PROVIDER_SITE_OTHER): Payer: Medicare HMO | Admitting: Student in an Organized Health Care Education/Training Program

## 2023-05-21 ENCOUNTER — Encounter (HOSPITAL_COMMUNITY): Payer: Self-pay | Admitting: Student in an Organized Health Care Education/Training Program

## 2023-05-21 VITALS — BP 126/73 | HR 66 | Wt 139.0 lb

## 2023-05-21 DIAGNOSIS — F411 Generalized anxiety disorder: Secondary | ICD-10-CM | POA: Diagnosis not present

## 2023-05-21 DIAGNOSIS — F0670 Mild neurocognitive disorder due to known physiological condition without behavioral disturbance: Secondary | ICD-10-CM

## 2023-05-21 DIAGNOSIS — F331 Major depressive disorder, recurrent, moderate: Secondary | ICD-10-CM

## 2023-05-21 MED ORDER — SERTRALINE HCL 50 MG PO TABS: 50.0000 mg | ORAL_TABLET | Freq: Every day | ORAL | 2 refills | Status: DC

## 2023-05-21 MED ORDER — SERTRALINE HCL 100 MG PO TABS: 100.0000 mg | ORAL_TABLET | Freq: Every day | ORAL | 2 refills | Status: DC

## 2023-05-21 NOTE — Patient Instructions (Addendum)
[employer] using this for transportation to therapy appts.  Call [phone], TTY [phone] a.m.-6 p.m. ET, Monday-Saturday.  Also suggest looking on the back of the [employer], for a phone number to contact for transportation.    Therapy Referral sent to the [place] office address:  [facility] [address] [place] [address] [place],  [place]  [address] [phone]
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
## 2023-05-24 DIAGNOSIS — Z5181 Encounter for therapeutic drug level monitoring: Principal | ICD-10-CM

## 2023-05-24 DIAGNOSIS — Z944 Liver transplant status: Principal | ICD-10-CM

## 2023-05-24 DIAGNOSIS — E612 Magnesium deficiency: Principal | ICD-10-CM

## 2023-05-31 DIAGNOSIS — Z944 Liver transplant status: Principal | ICD-10-CM

## 2023-05-31 DIAGNOSIS — Z5181 Encounter for therapeutic drug level monitoring: Principal | ICD-10-CM

## 2023-05-31 DIAGNOSIS — E612 Magnesium deficiency: Principal | ICD-10-CM

## 2023-06-15 ENCOUNTER — Encounter: Payer: Self-pay | Admitting: Internal Medicine

## 2023-06-15 ENCOUNTER — Ambulatory Visit (INDEPENDENT_AMBULATORY_CARE_PROVIDER_SITE_OTHER): Payer: Medicare HMO | Admitting: Internal Medicine

## 2023-06-15 VITALS — BP 142/72 | HR 67 | Temp 98.1°F | Resp 16 | Ht 63.0 in | Wt 137.2 lb

## 2023-06-15 DIAGNOSIS — J0100 Acute maxillary sinusitis, unspecified: Secondary | ICD-10-CM | POA: Diagnosis not present

## 2023-06-15 DIAGNOSIS — I10 Essential (primary) hypertension: Secondary | ICD-10-CM

## 2023-06-15 DIAGNOSIS — N1832 Chronic kidney disease, stage 3b: Secondary | ICD-10-CM | POA: Diagnosis not present

## 2023-06-15 MED ORDER — AZITHROMYCIN 500 MG PO TABS: 500.0000 mg | ORAL_TABLET | Freq: Every day | ORAL | 0 refills | Status: AC

## 2023-06-15 MED ORDER — OLMESARTAN MEDOXOMIL 5 MG PO TABS: 10.0000 mg | ORAL_TABLET | Freq: Every day | ORAL | 0 refills | Status: DC

## 2023-06-15 NOTE — Patient Instructions (Signed)

## 2023-06-15 NOTE — Progress Notes (Signed)
 Subjective:  Patient ID: Candace Hill, female    DOB: [birth year]  Age: 77 y.o. MRN: [id number]  CC: Hypertension and Sinusitis   HPI Candace Hill presents for f/up ---   Discussed the use of AI scribe software for clinical note transcription with the patient, who gave verbal consent to proceed.  History of Present Illness   The patient, with a history of hypertension, kidney disease, and vascular dementia, presents with fluctuating blood pressure and sinus symptoms. She reports that her blood pressure has been spiking high and low, with readings as low as 96/55 and as high as 162/82. The patient stopped taking olmesartan  due to low blood pressure episodes, which were so severe that she could barely walk. However, after discontinuing the medication, her blood pressure increased, prompting her to resume the medication.  In addition to blood pressure concerns, the patient has been experiencing sinus symptoms for about a week. She describes feeling congested and believes she has a sinus infection. She has been taking Robitussin for diabetes and high blood pressure to manage these symptoms. The patient also reports feeling weak and dizzy, but is unsure of the cause.  The patient also mentions that she has been experiencing green, bloody discharge from her nose when she sleeps at a specific location that uses a cold mist humidifier. She has a history of sinus problems and surgery, and associates the green discharge with infection. She has been experiencing these symptoms since her first visit to this location, which was before Christmas.  The patient was unaware of her kidney disease diagnosis, which she has had for a couple of years. She expresses concern about her blood pressure being too high due to her vascular dementia, but also understands the risks of low blood pressure due to her kidney disease.       Outpatient Medications Prior to Visit  Medication Sig Dispense Refill   aspirin  EC  81 MG tablet Take 81 mg by mouth daily.     atorvastatin  (LIPITOR) 10 MG tablet TAKE 1 TABLET EVERY DAY 90 tablet 1   CALCIUM -VITAMIN D  PO Take 600 mg by mouth daily.     Ferrous Sulfate  (IRON) 325 (65 Fe) MG TABS Take 1 tablet by mouth daily.     folic acid  (FOLVITE ) 400 MCG tablet Take 800 mcg by mouth daily.     memantine  (NAMENDA  XR) 21 MG CP24 24 hr capsule Take 1 capsule (21 mg total) by mouth daily. 90 capsule 0   sertraline  (ZOLOFT ) 100 MG tablet Take 1 tablet (100 mg total) by mouth daily. Take with 50mg  tablet daily 30 tablet 2   sertraline  (ZOLOFT ) 50 MG tablet Take 1 tablet (50 mg total) by mouth daily. Take with 100mg  tablet daily. 30 tablet 2   tacrolimus  (PROGRAF ) 0.5 MG capsule 6 by mouth daily NAME BRAND ONLY     olmesartan  (BENICAR ) 20 MG tablet TAKE 1 TABLET EVERY DAY 90 tablet 1   No facility-administered medications prior to visit.    ROS Review of Systems  Constitutional: Negative.  Negative for appetite change, diaphoresis, fatigue and unexpected weight change.  HENT:  Positive for rhinorrhea, sinus pressure and sinus pain. Negative for congestion, facial swelling, postnasal drip, sore throat and trouble swallowing.   Eyes: Negative.   Respiratory:  Positive for cough. Negative for chest tightness, shortness of breath and wheezing.   Cardiovascular:  Negative for chest pain, palpitations and leg swelling.  Gastrointestinal:  Negative for abdominal pain, diarrhea, nausea and vomiting.  Endocrine: Negative.   Genitourinary: Negative.  Negative for difficulty urinating.  Musculoskeletal: Negative.   Skin: Negative.   Neurological:  Negative for dizziness, weakness and light-headedness.  Hematological:  Negative for adenopathy. Does not bruise/bleed easily.  Psychiatric/Behavioral:  Positive for confusion and decreased concentration.     Objective:  BP (!) 142/72 (BP Location: Left Arm, Patient Position: Sitting, Cuff Size: Normal)   Pulse 67   Temp 98.1 F (36.7  C) (Oral)   Resp 16   Ht 5' 3 (1.6 m)   Wt 137 lb 3.2 oz (62.2 kg)   SpO2 94%   BMI 24.30 kg/m   BP Readings from Last 3 Encounters:  06/15/23 (!) 142/72  05/03/23 126/66  04/27/23 112/70    Wt Readings from Last 3 Encounters:  06/15/23 137 lb 3.2 oz (62.2 kg)  05/03/23 136 lb (61.7 kg)  04/27/23 138 lb 3.2 oz (62.7 kg)    Physical Exam Vitals reviewed.  Constitutional:      General: She is not in acute distress.    Appearance: She is ill-appearing. She is not toxic-appearing or diaphoretic.  HENT:     Right Ear: Tympanic membrane normal. No middle ear effusion.     Left Ear: Tympanic membrane normal.  No middle ear effusion.     Nose: Rhinorrhea present. Rhinorrhea is purulent.     Right Sinus: No maxillary sinus tenderness or frontal sinus tenderness.     Left Sinus: No maxillary sinus tenderness or frontal sinus tenderness.     Mouth/Throat:     Mouth: Mucous membranes are moist.  Eyes:     General: No scleral icterus.    Conjunctiva/sclera: Conjunctivae normal.  Cardiovascular:     Rate and Rhythm: Normal rate and regular rhythm.     Heart sounds: No murmur heard.    No friction rub. No gallop.  Pulmonary:     Effort: Pulmonary effort is normal.     Breath sounds: No stridor. No wheezing, rhonchi or rales.  Abdominal:     General: Abdomen is flat.     Palpations: There is no mass.     Tenderness: There is no abdominal tenderness. There is no guarding.     Hernia: No hernia is present.  Musculoskeletal:        General: Normal range of motion.     Cervical back: Neck supple.     Right lower leg: No edema.     Left lower leg: No edema.  Skin:    General: Skin is warm and dry.     Findings: No rash.  Neurological:     General: No focal deficit present.     Mental Status: She is alert. Mental status is at baseline.  Psychiatric:        Mood and Affect: Mood normal.        Behavior: Behavior normal.     Lab Results  Component Value Date   WBC 10.2  01/25/2023   HGB 13.4 01/25/2023   HCT 40.1 01/25/2023   PLT 199.0 01/25/2023   GLUCOSE 93 04/27/2023   CHOL 161 01/25/2023   TRIG 132.0 01/25/2023   HDL 52.90 01/25/2023   LDLCALC 82 01/25/2023   ALT 9 04/27/2023   AST 16 04/27/2023   NA 139 04/27/2023   K 4.2 04/27/2023   CL 103 04/27/2023   CREATININE 1.27 (H) 04/27/2023   BUN 15 04/27/2023   CO2 28 04/27/2023   TSH 3.01 01/25/2023   INR 0.9 04/15/2021  HGBA1C 5.4 03/12/2022    CT ANGIO ABDOMEN W &/OR WO CONTRAST Result Date: 04/09/2023 CLINICAL DATA:  Splenic artery aneurysm.  Portal vein aneurysm. EXAM: CT ANGIOGRAPHY ABDOMEN TECHNIQUE: Multidetector CT imaging of the abdomen was performed using the standard protocol during bolus administration of intravenous contrast. Multiplanar reconstructed images and MIPs were obtained and reviewed to evaluate the vascular anatomy. RADIATION DOSE REDUCTION: This exam was performed according to the departmental dose-optimization program which includes automated exposure control, adjustment of the mA and/or kV according to patient size and/or use of iterative reconstruction technique. CONTRAST:  75mL ISOVUE -370 IOPAMIDOL  (ISOVUE -370) INJECTION 76% COMPARISON:  CT abdomen and pelvis 08/17/2022 FINDINGS: VASCULAR Aorta: Normal caliber aorta without aneurysm, dissection, vasculitis or significant stenosis. Calcified atherosclerotic disease again seen. Celiac: Peripherally calcified splenic artery aneurysm measures 12 mm, unchanged from prior. More peripheral calcified splenic artery aneurysms measure 5 mm and 6 mm, similar to prior. Otherwise, celiac artery and its branches are within normal limits. SMA: Patent without evidence of aneurysm, dissection, vasculitis or significant stenosis. Renals: Both renal arteries are patent without evidence of aneurysm, dissection, vasculitis, fibromuscular dysplasia or significant stenosis. Accessory right renal artery present. IMA: Patent without evidence of  aneurysm, dissection, vasculitis or significant stenosis. Inflow: The visualized common iliac arteries are within normal limits. Veins: No obvious venous abnormality within the limitations of this arterial phase study. Review of the MIP images confirms the above findings. NON-VASCULAR Lower chest: No acute abnormality. Hepatobiliary: Status post liver transplant. No focal liver abnormality is seen. Status post cholecystectomy. No biliary dilatation. Pancreas: Unremarkable. No pancreatic ductal dilatation or surrounding inflammatory changes. Spleen: Normal in size without focal abnormality. Adrenals/Urinary Tract: There is a 2.3 cm right renal cyst. There is no hydronephrosis or perinephric fat stranding. The adrenal glands are within normal limits. Stomach/Bowel: No dilated bowel loops are seen. No free intraperitoneal air. There is a small hiatal hernia. Lymphatic: No enlarged lymph nodes. Other: No ascites or focal abdominal wall hernia. Musculoskeletal: No acute or significant osseous findings. IMPRESSION: 1. 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 Aortic Atherosclerosis (ICD10-I70.0). NON-VASCULAR 1. No acute localizing process in the abdomen. 2. Status post liver transplant. 3. Small hiatal hernia. Electronically Signed   By: Greig Pique M.D.   On: 04/09/2023 00:46    Assessment & Plan:   Stage 3b chronic kidney disease- Renal function has been stable.  Essential hypertension - Will lower the ARB dose. -     Olmesartan  Medoxomil; Take 2 tablets (10 mg total) by mouth daily.  Dispense: 180 tablet; Refill: 0  Acute non-recurrent maxillary sinusitis -     Azithromycin ; Take 1 tablet (500 mg total) by mouth daily for 3 days.  Dispense: 3 tablet; Refill: 0     Follow-up: Return in about 3 months (around 09/13/2023).  Debby Molt, MD

## 2023-06-15 NOTE — Unmapped (Signed)
[facility] Refill Coordination Note    Specialty Medication(s) to be Shipped:   Transplant: tacrolimus 0.5mg     Other medication(s) to be shipped: No additional medications requested for fill at this time     Kristy Hanson, DOB: Jun 28, 1945  Phone: [phone] (home)       All above HIPAA information was verified with patient's family member, daughter.     Was a Nurse, learning disability used for this call? No    Completed refill call assessment today to schedule patient's medication shipment from the [facility]  [phone]).  All relevant notes have been reviewed.     Specialty medication(s) and dose(s) confirmed: Regimen is correct and unchanged.   Changes to medications: Kristy Hanson reports no changes at this time.  Changes to insurance: No  New side effects reported not previously addressed with a pharmacist or physician: None reported  Questions for the pharmacist: No    Confirmed patient received a Conservation officer, historic buildings and a Surveyor, mining with first shipment. The patient will receive a drug information handout for each medication shipped and additional FDA Medication Guides as required.       DISEASE/MEDICATION-SPECIFIC INFORMATION        N/A    SPECIALTY MEDICATION ADHERENCE     Medication Adherence    Patient reported X missed doses in the last month: 0  Specialty Medication: tacrolimus 0.5 MG capsule (PROGRAF)  Patient is on additional specialty medications: No  Informant: child/children              Were doses missed due to medication being on hold? No     tacrolimus 0.5 MG capsule (PROGRAF): 2 days of medicine on hand       REFERRAL TO PHARMACIST     Referral to the pharmacist: Not needed      [facility]     Shipping address confirmed in Epic.       Delivery Scheduled: Yes, Expected medication delivery date: 06/18/23.     Medication will be delivered via UPS to the prescription address in Epic WAM.    Craige Cotta   [facility]  Specialty Technician

## 2023-06-17 MED FILL — TACROLIMUS 0.5 MG CAPSULE, IMMEDIATE-RELEASE: ORAL | 30 days supply | Qty: 180 | Fill #6

## 2023-06-22 ENCOUNTER — Telehealth: Payer: Self-pay | Admitting: Internal Medicine

## 2023-06-22 NOTE — Telephone Encounter (Signed)
 Copied from CRM [id number]. Topic: Clinical - Medication Question >> Jun 22, 2023  3:26 PM Franky GRADE wrote: Reason for CRM: Patient's daughter is calling regarding patient olmesartan  (BENICAR ) 5 MG tablet, per their last visit they were going to decrease from 20mg  to 10mg ; however, the pharmacy gave them 5mg  tablets. She is a bit confused and would like to speak with a nurse for clarification.

## 2023-06-22 NOTE — Telephone Encounter (Signed)
 Patients daughter states that with her taking the 10mg  her blood pressure is too high, is there any way she can take a 15mg  dose

## 2023-06-22 NOTE — Telephone Encounter (Signed)
 Copied from CRM [id number]. Topic: General - Other >> Jun 22, 2023  3:36 PM Taleah C wrote:  Reason for CRM: daughter called back and wanted to disregard previous message regarding pt's medication. She stated that she figured it out.

## 2023-06-24 ENCOUNTER — Encounter: Payer: Self-pay | Admitting: Internal Medicine

## 2023-07-12 ENCOUNTER — Other Ambulatory Visit: Payer: Self-pay | Admitting: Internal Medicine

## 2023-07-12 DIAGNOSIS — F01B4 Vascular dementia, moderate, with anxiety: Secondary | ICD-10-CM

## 2023-07-13 MED FILL — TACROLIMUS 0.5 MG CAPSULE, IMMEDIATE-RELEASE: ORAL | 30 days supply | Qty: 180 | Fill #7

## 2023-07-13 NOTE — Unmapped (Signed)
[facility] and Home Delivery Pharmacy Clinical Assessment & Refill Coordination Note    Kristy Hanson, DOB: [birth year]  Phone: [phone] (home)     All above HIPAA information was verified with patient's family member, daughter.     Was a Nurse, learning disability used for this call? No    Specialty Medication(s):   Transplant: tacrolimus 0.5mg      Current Outpatient Medications   Medication Sig Dispense Refill    amlodipine (NORVASC) 2.5 MG tablet Take 1 tablet (2.5 mg total) by mouth daily.      aspirin (ECOTRIN) 81 MG tablet Take 1 tablet (81 mg total) by mouth daily.      atorvastatin (LIPITOR) 10 MG tablet Take 1 tablet (10 mg total) by mouth daily.      biotin 1,000 mcg Chew Chew 1,000 mcg  in the morning.      ferrous sulfate 325 (65 FE) MG tablet Take 1 tablet (325 mg total) by mouth in the morning.      memantine (NAMENDA) 5 MG tablet Take 1 tablet (5 mg total) by mouth two (2) times a day.      psyllium (METAMUCIL) 3.4 gram packet Take 1 packet by mouth daily as needed (constipation).      sertraline (ZOLOFT) 100 MG tablet Take 1 tablet (100 mg total) by mouth daily.      tacrolimus (PROGRAF) 0.5 MG capsule Take 3 capsules (1.5 mg total) by mouth two (2) times a day. 540 capsule 4     No current facility-administered medications for this visit.        Changes to medications: [name] reports no changes at this time.    Medication list has been reviewed and updated in Epic:  yes    Allergies   Allergen Reactions    Avastin [Bevacizumab] Other (See Comments)     Mini stroke    Prednisone Other (See Comments)     Patient was told by her ophthalmologist that the vision loss she had in one of her eyes was likely a result of high dose steroids. She also reports insomnia/jitters when taking it.    Amoxicillin-Pot Clavulanate      Other reaction(s): NAUSEA    Codeine      Other reaction(s): NAUSEA    Penicillins      Other reaction(s): UNKNOWN  * Pt would like to know why she shouldn't take this*    Procaine      Other reaction(s): dizziness    Promethazine      oversedation    Sulfa (Sulfonamide Antibiotics)      Other reaction(s): ITCHING    Lidocaine Palpitations    Tamsulosin Itching       Changes to allergies: No    Allergies have been reviewed and updated in Epic: Yes    SPECIALTY MEDICATION ADHERENCE     Tacrolimus 0.5mg   : 1 days of medicine on hand       Medication Adherence    Patient reported X missed doses in the last month: 0  Specialty Medication: tacrolimus 0.5mg   Patient is on additional specialty medications: No          Specialty medication(s) dose(s) confirmed: Regimen is correct and unchanged.     Are there any concerns with adherence? No    Adherence counseling provided? Not needed    CLINICAL MANAGEMENT AND INTERVENTION      Clinical Benefit Assessment:    Do you feel the medicine is effective or helping your condition? Yes  Clinical Benefit counseling provided? Not needed    Adverse Effects Assessment:    Are you experiencing any side effects? No    Are you experiencing difficulty administering your medicine? No    Quality of Life Assessment:    Quality of Life    Rheumatology  Oncology  Dermatology  Cystic Fibrosis          How many days over the past month did your transplant  keep you from your normal activities? For example, brushing your teeth or getting up in the morning. 0    Have you discussed this with your provider? Not needed    Acute Infection Status:    Acute infections noted within Epic:  Rule Out C. Diff  Patient reported infection: None    Therapy Appropriateness:    Is therapy appropriate based on current medication list, adverse reactions, adherence, clinical benefit and progress toward achieving therapeutic goals? Yes, therapy is appropriate and should be continued     DISEASE/MEDICATION-SPECIFIC INFORMATION      N/A    Solid Organ Transplant: Not Applicable    PATIENT SPECIFIC NEEDS     Does the patient have any physical, cognitive, or cultural barriers? No    Is the patient high risk? No    Did the patient require a clinical intervention? No    Does the patient require physician intervention or other additional services (i.e., nutrition, smoking cessation, social work)? No    Does the patient have an additional or emergency contact listed in their chart? Yes    SOCIAL DETERMINANTS OF HEALTH     At the [facility], we have learned that life circumstances - like trouble affording food, housing, utilities, or transportation can affect the health of many of our patients.   That is why we wanted to ask: are you currently experiencing any life circumstances that are negatively impacting your health and/or quality of life? Patient declined to answer    Social Drivers of Health     Food Insecurity: No Food Insecurity (03/10/2021)    Received from [facility], [facility]    Hunger Vital Sign     Worried About Running Out of Food in the Last Year: Never true     Ran Out of Food in the Last Year: Never true   Internet Connectivity: Not on file   Housing/Utilities: Not on file   Tobacco Use: Low Risk  (11/26/2022)    Patient History     Smoking Tobacco Use: Never     Smokeless Tobacco Use: Never     Passive Exposure: Not on file   Transportation Needs: No Transportation Needs (03/10/2021)    Received from [facility], [facility]    [facility] - Transportation     Lack of Transportation (Medical): No     Lack of Transportation (Non-Medical): No   Alcohol Use: Not on file   Interpersonal Safety: Not on file   Physical Activity: Insufficiently Active (03/10/2021)    Received from [facility], [facility]    Exercise Vital Sign     Days of Exercise per Week: 1 day     Minutes of Exercise per Session: 10 min   Intimate Partner Violence: Not At Risk (03/10/2021)    Received from [facility], [facility]    Humiliation, Afraid, Rape, and Kick questionnaire     Fear of Current or Ex-Partner: No     Emotionally Abused: No     Physically Abused: No  Sexually Abused: No   Stress: Stress Concern Present (03/10/2021) Received from [facility], [facility]    [facility] of Occupational Health - Occupational Stress Questionnaire     Feeling of Stress : Very much   Substance Use: Not on file (04/24/2023)   Social Connections: Socially Isolated (03/10/2021)    Received from [facility], [facility]    Social Connection and Isolation Panel [NHANES]     Frequency of Communication with Friends and Family: Never     Frequency of Social Gatherings with Friends and Family: Never     Attends Religious Services: Never     Database administrator or Organizations: No     Attends Banker Meetings: Never     Marital Status: Divorced   Programmer, applications: Low Risk  (03/10/2021)    Received from [facility], [facility]    Overall Financial Resource Strain (CARDIA)     Difficulty of Paying Living Expenses: Not very hard   Depression: Not at risk (07/26/2018)    Received from [facility] visits prior to 08/15/2022., [facility] visits prior to 08/15/2022.    PHQ-2     PHQ-2 Score: 0   Health Literacy: Not on file       Would you be willing to receive help with any of the needs that you have identified today? Not applicable       SHIPPING     Specialty Medication(s) to be Shipped:   Transplant: tacrolimus 0.5mg     Other medication(s) to be shipped: No additional medications requested for fill at this time     Changes to insurance: No    Delivery Scheduled: Yes, Expected medication delivery date: 07/14/2023.     Medication will be delivered via UPS to the confirmed prescription address in [facility].    The patient will receive a drug information handout for each medication shipped and additional FDA Medication Guides as required.  Verified that patient has previously received a Conservation officer, historic buildings and a Surveyor, mining.    The patient or caregiver noted above participated in the development of this care plan and knows that they can request review of or adjustments to the care plan at any time.      All of the patient's questions and concerns have been addressed.    Thad Ranger, PharmD   [facility] Specialty Pharmacist

## 2023-07-23 ENCOUNTER — Ambulatory Visit (HOSPITAL_COMMUNITY): Payer: Medicare Other | Admitting: Student in an Organized Health Care Education/Training Program

## 2023-07-23 ENCOUNTER — Telehealth (HOSPITAL_COMMUNITY): Payer: Self-pay | Admitting: Student in an Organized Health Care Education/Training Program

## 2023-07-23 NOTE — Telephone Encounter (Signed)
 Patient not able to come in for scheduled appt today, 07/23/2023. Patient daughter who is her transportation has Covid. Patient has been rescheduled for next Friday.   Patient is not endorsing SI. Patient will speak with her granddaughter, to get her last refill because she will run out on Tuesday.   PGY-4 Reggie Rice, MD

## 2023-07-28 ENCOUNTER — Encounter: Payer: Self-pay | Admitting: Internal Medicine

## 2023-07-28 ENCOUNTER — Ambulatory Visit: Payer: Medicare Other | Admitting: Internal Medicine

## 2023-07-28 VITALS — BP 116/58 | HR 73 | Temp 97.4°F | Resp 16 | Ht 63.0 in | Wt 133.8 lb

## 2023-07-28 DIAGNOSIS — N184 Chronic kidney disease, stage 4 (severe): Secondary | ICD-10-CM | POA: Diagnosis not present

## 2023-07-28 DIAGNOSIS — I10 Essential (primary) hypertension: Secondary | ICD-10-CM

## 2023-07-28 DIAGNOSIS — K219 Gastro-esophageal reflux disease without esophagitis: Secondary | ICD-10-CM

## 2023-07-28 DIAGNOSIS — N1832 Chronic kidney disease, stage 3b: Secondary | ICD-10-CM

## 2023-07-28 DIAGNOSIS — R300 Dysuria: Secondary | ICD-10-CM | POA: Diagnosis not present

## 2023-07-28 NOTE — Progress Notes (Signed)
Subjective:  Patient ID: Candace Hill, female    DOB: Jun 21, 1945  Age: 78 y.o. MRN: [id number]  CC: Hypertension   HPI [name] presents for f/up ---  Discussed the use of AI scribe software for clinical note transcription with the patient, who gave verbal consent to proceed.  History of Present Illness   Candace Ard "Dennie Hill" is a 78 year old female who presents with concerns about blood pressure management and potential COVID-19 exposure. She is accompanied by her granddaughter.  She has been staying at home more frequently as her daughter recently contracted COVID-19 about a week to ten days ago. Despite living in the same house, she has not experienced any COVID-19 symptoms herself, such as sore throat, cough, fever, chills, or night sweats. She has had COVID-19 in the past, several years ago, but not recently. She is taking precautions by wearing a mask.  She is monitoring her blood pressure closely and provided readings from January and February for review. Her current blood pressure reading is 116/58. She experiences occasional feelings of weakness, dizziness, or lightheadedness, which she attributes to resting and staying indoors due to the weather. No chest pain or swelling in her legs or feet.       Outpatient Medications Prior to Visit  Medication Sig Dispense Refill   aspirin EC 81 MG tablet Take 81 mg by mouth daily.     atorvastatin (LIPITOR) 10 MG tablet TAKE 1 TABLET EVERY DAY 90 tablet 1   CALCIUM-VITAMIN D PO Take 600 mg by mouth daily.     Ferrous Sulfate (IRON) 325 (65 Fe) MG TABS Take 1 tablet by mouth daily.     folic acid (FOLVITE) 400 MCG tablet Take 800 mcg by mouth daily.     memantine (NAMENDA XR) 21 MG CP24 24 hr capsule TAKE 1 CAPSULE(21 MG) BY MOUTH DAILY 90 capsule 0   olmesartan (BENICAR) 5 MG tablet Take 2 tablets (10 mg total) by mouth daily. 180 tablet 0   tacrolimus (PROGRAF) 0.5 MG capsule 6 by mouth daily NAME BRAND ONLY      sertraline (ZOLOFT) 100 MG tablet Take 1 tablet (100 mg total) by mouth daily. Take with 50mg  tablet daily 30 tablet 2   sertraline (ZOLOFT) 50 MG tablet Take 1 tablet (50 mg total) by mouth daily. Take with 100mg  tablet daily. 30 tablet 2   No facility-administered medications prior to visit.    ROS Review of Systems  Constitutional: Negative.  Negative for chills, diaphoresis, fatigue and unexpected weight change.  HENT: Negative.    Respiratory: Negative.  Negative for cough, chest tightness, shortness of breath and wheezing.   Cardiovascular:  Negative for chest pain, palpitations and leg swelling.  Gastrointestinal:  Negative for abdominal pain, constipation, diarrhea, nausea and vomiting.  Genitourinary:  Positive for dysuria. Negative for decreased urine volume, flank pain, hematuria and urgency.  Musculoskeletal: Negative.  Negative for arthralgias.  Skin: Negative.   Neurological:  Negative for dizziness, weakness and light-headedness.  Hematological:  Negative for adenopathy. Does not bruise/bleed easily.  Psychiatric/Behavioral:  Positive for confusion and decreased concentration. Negative for self-injury. The patient is not nervous/anxious.     Objective:  BP (!) 116/58 (BP Location: Left Arm, Patient Position: Sitting, Cuff Size: Small)   Pulse 73   Temp (!) 97.4 F (36.3 C) (Oral)   Resp 16   Ht 5\' 3"  (1.6 m)   Wt 133 lb 12.8 oz (60.7 kg)   SpO2 95%  BMI 23.70 kg/m   BP Readings from Last 3 Encounters:  07/28/23 (!) 116/58  06/15/23 (!) 142/72  05/03/23 126/66    Wt Readings from Last 3 Encounters:  07/28/23 133 lb 12.8 oz (60.7 kg)  06/15/23 137 lb 3.2 oz (62.2 kg)  05/03/23 136 lb (61.7 kg)    Physical Exam Vitals reviewed.  Constitutional:      Appearance: Normal appearance. She is not ill-appearing.  HENT:     Mouth/Throat:     Mouth: Mucous membranes are moist.  Eyes:     General: No scleral icterus.    Conjunctiva/sclera: Conjunctivae normal.   Cardiovascular:     Rate and Rhythm: Normal rate and regular rhythm.     Heart sounds: No murmur heard. Pulmonary:     Effort: Pulmonary effort is normal.     Breath sounds: No stridor. No wheezing, rhonchi or rales.  Abdominal:     General: Abdomen is flat. There is no distension.     Palpations: There is no mass.     Tenderness: There is no abdominal tenderness. There is no guarding.     Hernia: No hernia is present.  Musculoskeletal:        General: Normal range of motion.     Cervical back: Neck supple.     Right lower leg: No edema.     Left lower leg: No edema.  Lymphadenopathy:     Cervical: No cervical adenopathy.  Skin:    General: Skin is warm and dry.     Findings: No rash.  Neurological:     General: No focal deficit present.     Mental Status: She is alert. Mental status is at baseline.  Psychiatric:        Mood and Affect: Mood normal.        Behavior: Behavior normal.     Lab Results  Component Value Date   WBC 9.2 07/28/2023   HGB 13.7 07/28/2023   HCT 41.0 07/28/2023   PLT 207.0 07/28/2023   GLUCOSE 99 07/28/2023   CHOL 161 01/25/2023   TRIG 132.0 01/25/2023   HDL 52.90 01/25/2023   LDLCALC 82 01/25/2023   ALT 9 04/27/2023   AST 16 04/27/2023   NA 139 07/28/2023   K 4.3 07/28/2023   CL 101 07/28/2023   CREATININE 1.67 (H) 07/28/2023   BUN 26 (H) 07/28/2023   CO2 27 07/28/2023   TSH 3.01 01/25/2023   INR 0.9 04/15/2021   HGBA1C 5.4 03/12/2022    CT ANGIO ABDOMEN W &/OR WO CONTRAST Result Date: 04/09/2023 CLINICAL DATA:  Splenic artery aneurysm.  Portal vein aneurysm. EXAM: CT ANGIOGRAPHY ABDOMEN TECHNIQUE: Multidetector CT imaging of the abdomen was performed using the standard protocol during bolus administration of intravenous contrast. Multiplanar reconstructed images and MIPs were obtained and reviewed to evaluate the vascular anatomy. RADIATION DOSE REDUCTION: This exam was performed according to the departmental dose-optimization program  which includes automated exposure control, adjustment of the mA and/or kV according to patient size and/or use of iterative reconstruction technique. CONTRAST:  75mL ISOVUE-370 IOPAMIDOL (ISOVUE-370) INJECTION 76% COMPARISON:  CT abdomen and pelvis 08/17/2022 FINDINGS: VASCULAR Aorta: Normal caliber aorta without aneurysm, dissection, vasculitis or significant stenosis. Calcified atherosclerotic disease again seen. Celiac: Peripherally calcified splenic artery aneurysm measures 12 mm, unchanged from prior. More peripheral calcified splenic artery aneurysms measure 5 mm and 6 mm, similar to prior. Otherwise, celiac artery and its branches are within normal limits. SMA: Patent without evidence of aneurysm, dissection,  vasculitis or significant stenosis. Renals: Both renal arteries are patent without evidence of aneurysm, dissection, vasculitis, fibromuscular dysplasia or significant stenosis. Accessory right renal artery present. IMA: Patent without evidence of aneurysm, dissection, vasculitis or significant stenosis. Inflow: The visualized common iliac arteries are within normal limits. Veins: No obvious venous abnormality within the limitations of this arterial phase study. Review of the MIP images confirms the above findings. NON-VASCULAR Lower chest: No acute abnormality. Hepatobiliary: Status post liver transplant. No focal liver abnormality is seen. Status post cholecystectomy. No biliary dilatation. Pancreas: Unremarkable. No pancreatic ductal dilatation or surrounding inflammatory changes. Spleen: Normal in size without focal abnormality. Adrenals/Urinary Tract: There is a 2.3 cm right renal cyst. There is no hydronephrosis or perinephric fat stranding. The adrenal glands are within normal limits. Stomach/Bowel: No dilated bowel loops are seen. No free intraperitoneal air. There is a small hiatal hernia. Lymphatic: No enlarged lymph nodes. Other: No ascites or focal abdominal wall hernia. Musculoskeletal: No  acute or significant osseous findings. IMPRESSION: 1. 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 Aortic Atherosclerosis (ICD10-I70.0). NON-VASCULAR 1. No acute localizing process in the abdomen. 2. Status post liver transplant. 3. Small hiatal hernia. Electronically Signed   By: Darliss Cheney M.D.   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 -     Urinalysis, Routine w reflex microscopic; Future  Essential hypertension- BP is well controlled. -     CBC with Differential/Platelet; Future -     Basic metabolic panel; Future -     Urinalysis, Routine w reflex microscopic; Future  Gastroesophageal reflux disease, unspecified whether esophagitis present -     CBC with Differential/Platelet; Future  Dysuria- Urine culture is negative. -     CULTURE, URINE COMPREHENSIVE; Future -     Urinalysis, Routine w reflex microscopic; Future  Stage 4 chronic kidney disease (HCC) -     Ambulatory referral to Nephrology     Follow-up: Return in about 6 months (around 01/25/2024).  Sanda Linger, MD

## 2023-07-28 NOTE — Patient Instructions (Signed)
Chronic Kidney Disease in Adults: What to Know Chronic kidney disease (CKD) is when lasting damage happens to the kidneys slowly over time. The kidneys are two organs that do many important things in the body. These include: Taking waste and extra fluid out of the blood to make pee (urine). Making hormones. Keeping the right amount of fluids and chemicals in the body. A small amount of kidney damage may not cause problems. You must take steps to help keep the kidney damage from getting worse. A lot of damage may cause kidney failure. Kidney failure means the kidneys can no longer work right. What are the causes? Diabetes. High blood pressure. Diseases that affect the heart and blood vessels. Other kidney diseases. Diseases that affect the body's defense system (immune system). A problem with the flow of pee. This may be caused by: Kidney stones. Cancer. An enlarged prostate, in males. A kidney infection or urinary tract infection (UTI) that keeps coming back. What increases the risk? Getting older. The chances of having CKD increase with age. A family history of kidney disease or kidney failure. Having a disease caused by genes. Taking medicines that can harm the kidneys. Being near or having contact with harmful substances. Being very overweight. Using tobacco now or in the past. What are the signs or symptoms? Common symptoms of CKD include: Feeling very tired and having less energy. Swelling of the face, legs, ankles, or feet. Throwing up or feeling like you may throw up. Not wanting to eat as much as normal. Being confused or not able to focus. Twitches and cramps in the leg muscles or other muscles. Dry, itchy skin. Other symptoms may include: Shortness of breath. Trouble sleeping. Making less pee, or making more pee, especially at night. A taste of metal in your mouth. You may also become anemic. Anemia means there's not enough red blood cells in your blood. You may get  symptoms slowly. You may not notice them until the kidney damage gets very bad. How is this diagnosed? CKD may be diagnosed based on: Tests on your blood or pee. Imaging tests, like an ultrasound or a CT scan. A kidney biopsy. For this test, a sample of kidney tissue is removed to be looked at under a microscope. These tests will help to find out how serious the CKD is. How is this treated? Often, there's no cure for CKD. Treatment can help with symptoms and help keep the disease from getting worse. Treatment may include: Treating other problems that are causing your CKD or making it worse. Diet changes. You may need to: Avoid alcohol. Avoid foods that are high in salt, potassium, phosphorous, and protein. Taking medicines for symptoms and to help control other conditions. Dialysis. This treatment gets harmful waste out of your body. It may be needed if you have kidney failure. Follow these instructions at home: Medicines Take your medicines only as told. The amount of some medicines you take may need to be changed. Do not take any new medicines, vitamins, or supplements unless your health care provider says it's okay. These may make kidney damage worse. Lifestyle Do not smoke, vape, or use nicotine or tobacco. If you drink alcohol: Limit how much you have to: 0-1 drink a day if you're female. 0-2 drinks a day if you're female. Know how much alcohol is in your drink. In the [place] one drink is one 12 oz bottle of beer (355 mL), one 5 oz glass of wine (148 mL), or one 1 oz  glass of hard liquor (44 mL). Stay at a healthy weight. If you need help, ask your provider. General instructions  Eat and drink as told. Track your blood pressure at home. Tell your provider about any changes. If you have diabetes, track your blood sugar as told. Exercise at least 30 minutes a day, 5 days a week. Keep your shots (vaccinations) up to date. Keep all follow-up visits. Your provider may need to change  your treatments over time. Where to find support American Kidney Fund: [place].com.au Kidney School: kidneyschool.org American Association of Kidney Patients: https://www.miller-montoya.com/ Where to find more information National Kidney Foundation: kidney.org Centers for Disease Control and Prevention. To learn more: Go to DiningCalendar.de. Click "Search". Type "chronic kidney disease" in the search box. Contact a health care provider if: You have new symptoms. You get symptoms of end-stage kidney disease. These include: Headaches. Numbness in your hands or feet. Leg cramps. Easy bruising. Get help right away if: You have a fever. You make less pee than usual. You have pain or bleeding when you pee or poop. You have chest pain. You have shortness of breath. These symptoms may be an emergency. Call 911 right away. Do not wait to see if the symptoms will go away. Do not drive yourself to the hospital. This information is not intended to replace advice given to you by your health care provider. Make sure you discuss any questions you have with your health care provider. Document Revised: 04/13/2023 Document Reviewed: 12/04/2022 Elsevier Patient Education  2024 [employer].

## 2023-07-29 ENCOUNTER — Encounter: Payer: Self-pay | Admitting: Internal Medicine

## 2023-07-29 ENCOUNTER — Ambulatory Visit: Payer: Self-pay | Admitting: Internal Medicine

## 2023-07-29 ENCOUNTER — Other Ambulatory Visit (INDEPENDENT_AMBULATORY_CARE_PROVIDER_SITE_OTHER): Payer: Medicare Other

## 2023-07-29 DIAGNOSIS — N184 Chronic kidney disease, stage 4 (severe): Secondary | ICD-10-CM

## 2023-07-29 DIAGNOSIS — R300 Dysuria: Secondary | ICD-10-CM | POA: Insufficient documentation

## 2023-07-29 DIAGNOSIS — I10 Essential (primary) hypertension: Secondary | ICD-10-CM

## 2023-07-29 DIAGNOSIS — N1832 Chronic kidney disease, stage 3b: Secondary | ICD-10-CM

## 2023-07-29 HISTORY — DX: Dysuria: R30.0

## 2023-07-29 HISTORY — DX: Chronic kidney disease, stage 4 (severe): N18.4

## 2023-07-29 LAB — BASIC METABOLIC PANEL
BUN: 26 mg/dL — ABNORMAL HIGH (ref 6–23)
CO2: 27 meq/L (ref 19–32)
Calcium: 9.4 mg/dL (ref 8.4–10.5)
Chloride: 101 meq/L (ref 96–112)
Creatinine, Ser: 1.67 mg/dL — ABNORMAL HIGH (ref 0.40–1.20)
GFR: 29.73 mL/min — ABNORMAL LOW (ref >60.00)
Glucose, Bld: 99 mg/dL (ref 70–99)
Potassium: 4.3 meq/L (ref 3.5–5.1)
Sodium: 139 meq/L (ref 135–145)

## 2023-07-29 LAB — CBC WITH DIFFERENTIAL/PLATELET
Basophils Absolute: 0.1 10*3/uL (ref 0.0–0.1)
Basophils Relative: 1 % (ref 0.0–3.0)
Eosinophils Absolute: 0.2 10*3/uL (ref 0.0–0.7)
Eosinophils Relative: 1.7 % (ref 0.0–5.0)
HCT: 41 % (ref 36.0–46.0)
Hemoglobin: 13.7 g/dL (ref 12.0–15.0)
Lymphocytes Relative: 17.9 % (ref 12.0–46.0)
Lymphs Abs: 1.7 10*3/uL (ref 0.7–4.0)
MCHC: 33.4 g/dL (ref 30.0–36.0)
MCV: 92.1 fL (ref 78.0–100.0)
Monocytes Absolute: 0.7 10*3/uL (ref 0.1–1.0)
Monocytes Relative: 7.2 % (ref 3.0–12.0)
Neutro Abs: 6.7 10*3/uL (ref 1.4–7.7)
Neutrophils Relative %: 72.2 % (ref 43.0–77.0)
Platelets: 207 10*3/uL (ref 150.0–400.0)
RBC: 4.45 Mil/uL (ref 3.87–5.11)
RDW: 13.7 % (ref 11.5–15.5)
WBC: 9.2 10*3/uL (ref 4.0–10.5)

## 2023-07-29 NOTE — Telephone Encounter (Signed)
The urine orders has been placed.

## 2023-07-29 NOTE — Telephone Encounter (Signed)
  Chief Complaint: Urinary Symptoms Symptoms: urinary frequency, burning sensation and lower abdominal pain when bladder is full. Patient is able to empty her bladder Frequency: couple of days Pertinent Negatives: Patient denies fever, CP, SOB Disposition: [] ED /[] Urgent Care (no appt availability in office) / [] Appointment(In office/virtual)/ []  [facility]/ [] Home Care/ [] Refused Recommended Disposition /[] [facility]/ [x]  Follow-up with PCP Additional Notes: Daughter called with concerns that patient may have a UTI. patient was seen yesterday by PCP without daughter and forgot to mention her urinary symptoms of frequency, burning sensation and lower abdominal pain. Daughter states symptoms have been going on for a couple of days per patient. Daughter is asking if a lab order for urine can be placed and she can get the specimen cup and bring home for patient to give urine sample or being patient to the office to give sample. Daughter was hoping since patient was seen yesterday that this could be a possibility. Daughter asked to be called back or message sent through patient's MyChart. Daughter verbalized understanding of plan and all questions answered.    Copied from CRM [id number]. Topic: Clinical - Red Word Triage >> Jul 29, 2023  9:48 AM Shelbie Proctor wrote: Red Word that prompted transfer to Nurse Triage: Patient's child [name] [phone], patient has early dementia and forgot to tell Dr. Yetta Barre yesterday office visit that patient has a UTI, frequency, burning sensation, uncomfortable and abdomen pain. April is asking if they can drop a urine sample today or tomorrow.   [id number] - [place], [place] - [address] AT [facility] [address] & [address] Phone: [phone] Fax: [phone] Reason for Disposition  Urinating more frequently than usual (i.e., frequency)  Answer Assessment - Initial Assessment Questions 1. SYMPTOM: "What's the main symptom  you're concerned about?" (e.g., frequency, incontinence)     Frequency, burning sensation, lower pelvic pain 2. ONSET: "When did the  frequency  start?"     Started a couple of days ago 3. PAIN: "Is there any pain?" If Yes, ask: "How bad is it?" (Scale: 1-10; mild, moderate, severe)     moderate 4. CAUSE: "What do you think is causing the symptoms?"     Possible UTI 5. OTHER SYMPTOMS: "Do you have any other symptoms?" (e.g., blood in urine, fever, flank pain, pain with urination)     Pain with urination  Protocols used: Urinary Symptoms-A-AH

## 2023-07-30 ENCOUNTER — Ambulatory Visit (HOSPITAL_COMMUNITY): Payer: Medicare Other | Admitting: Student in an Organized Health Care Education/Training Program

## 2023-07-30 VITALS — BP 113/58 | Wt 134.0 lb

## 2023-07-30 DIAGNOSIS — F411 Generalized anxiety disorder: Secondary | ICD-10-CM

## 2023-07-30 DIAGNOSIS — F331 Major depressive disorder, recurrent, moderate: Secondary | ICD-10-CM

## 2023-07-30 DIAGNOSIS — F0670 Mild neurocognitive disorder due to known physiological condition without behavioral disturbance: Secondary | ICD-10-CM

## 2023-07-30 LAB — URINALYSIS, ROUTINE W REFLEX MICROSCOPIC
Bilirubin Urine: NEGATIVE
Ketones, ur: NEGATIVE
Leukocytes,Ua: NEGATIVE
Nitrite: NEGATIVE
Specific Gravity, Urine: >=1.03 — AB (ref 1.000–1.030)
Total Protein, Urine: NEGATIVE
Urine Glucose: NEGATIVE
Urobilinogen, UA: 0.2 (ref 0.0–1.0)
pH: 6 (ref 5.0–8.0)

## 2023-07-30 MED ORDER — SERTRALINE HCL 50 MG PO TABS: 50.0000 mg | ORAL_TABLET | Freq: Every day | ORAL | 2 refills | Status: DC

## 2023-07-30 MED ORDER — SERTRALINE HCL 100 MG PO TABS: 100.0000 mg | ORAL_TABLET | Freq: Every day | ORAL | 2 refills | Status: DC

## 2023-07-30 MED ORDER — LAMOTRIGINE 25 MG PO TABS: 25.0000 mg | ORAL_TABLET | Freq: Every day | ORAL | 1 refills | Status: DC
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
## 2023-07-30 NOTE — Patient Instructions (Signed)
Therapist  [employer] and Tax inspector [phone]  [employer] [phone].

## 2023-07-31 LAB — CULTURE, URINE COMPREHENSIVE: RESULT:: NO GROWTH

## 2023-08-01 ENCOUNTER — Encounter: Payer: Self-pay | Admitting: Internal Medicine

## 2023-08-02 ENCOUNTER — Telehealth: Payer: Self-pay | Admitting: Internal Medicine

## 2023-08-02 NOTE — Telephone Encounter (Signed)
Copied from CRM [id number]. Topic: Referral - Question >> Aug 02, 2023  2:11 PM Adaysia C wrote: Reason for CRM: Marisue Ivan with [facility] called to inform the provider they received the referral but it needs to be sent to Nephrology. Please follow up with [facility] if necessary.

## 2023-08-04 ENCOUNTER — Other Ambulatory Visit: Payer: Self-pay | Admitting: Internal Medicine

## 2023-08-04 DIAGNOSIS — N184 Chronic kidney disease, stage 4 (severe): Secondary | ICD-10-CM

## 2023-08-04 NOTE — Telephone Encounter (Signed)
Can you change the referral?

## 2023-08-17 NOTE — Unmapped (Signed)
 3/4: patient's daughter is aware that meds are not automatically sent out. She requests phone calls instead of mychart, notes added in wam and epic -ef    3/4: patient's daughter states she has messaged clinic to discuss kidney numbers with tacrolimus but hasn't heard back. I will message clinic team as well to followup with patient/daughter -[name] Clinical Assessment & Refill Coordination Note    Kristy Hanson, DOB: [birth year]  Phone: [phone] (home)     All above HIPAA information was verified with patient's family member, daughter.     Was a Nurse, learning disability used for this call? No    Specialty Medication(s):   Transplant: tacrolimus 0.5mg      Current Outpatient Medications   Medication Sig Dispense Refill    amlodipine (NORVASC) 2.5 MG tablet Take 1 tablet (2.5 mg total) by mouth daily.      aspirin (ECOTRIN) 81 MG tablet Take 1 tablet (81 mg total) by mouth daily.      atorvastatin (LIPITOR) 10 MG tablet Take 1 tablet (10 mg total) by mouth daily.      biotin 1,000 mcg Chew Chew 1,000 mcg  in the morning.      ferrous sulfate 325 (65 FE) MG tablet Take 1 tablet (325 mg total) by mouth in the morning.      memantine (NAMENDA) 5 MG tablet Take 1 tablet (5 mg total) by mouth two (2) times a day.      psyllium (METAMUCIL) 3.4 gram packet Take 1 packet by mouth daily as needed (constipation).      sertraline (ZOLOFT) 100 MG tablet Take 1 tablet (100 mg total) by mouth daily.      tacrolimus (PROGRAF) 0.5 MG capsule Take 3 capsules (1.5 mg total) by mouth two (2) times a day. 540 capsule 4     No current facility-administered medications for this visit.        Changes to medications: [name] reports no changes at this time.    Medication list has been reviewed and updated in Epic: Yes    Allergies   Allergen Reactions    Avastin [Bevacizumab] Other (See Comments)     Mini stroke    Prednisone Other (See Comments)     Patient was told by her ophthalmologist that the vision loss she had in one of her eyes was likely a result of high dose steroids. She also reports insomnia/jitters when taking it.    Amoxicillin-Pot Clavulanate      Other reaction(s): NAUSEA    Codeine      Other reaction(s): NAUSEA    Penicillins      Other reaction(s): UNKNOWN  * Pt would like to know why she shouldn't take this*    Procaine      Other reaction(s): dizziness    Promethazine      oversedation    Sulfa (Sulfonamide Antibiotics)      Other reaction(s): ITCHING    Lidocaine Palpitations    Tamsulosin Itching       Changes to allergies: No    Allergies have been reviewed and updated in Epic: Yes    SPECIALTY MEDICATION ADHERENCE     Tacrolimus 0.5mg   : 4 days of medicine on hand       Medication Adherence    Patient reported X missed doses in the last month: 0  Specialty Medication: tacrolimus 0.5mg   Patient is on additional specialty medications: No  Specialty medication(s) dose(s) confirmed: Regimen is correct and unchanged.     Are there any concerns with adherence? No    Adherence counseling provided? Not needed    CLINICAL MANAGEMENT AND INTERVENTION      Clinical Benefit Assessment:    Do you feel the medicine is effective or helping your condition? Yes    Clinical Benefit counseling provided? Not needed    Adverse Effects Assessment:    Are you experiencing any side effects? No    Are you experiencing difficulty administering your medicine? No    Quality of Life Assessment:    Quality of Life    Rheumatology  Oncology  Dermatology  Cystic Fibrosis          How many days over the past month did your transplant  keep you from your normal activities? For example, brushing your teeth or getting up in the morning. 0    Have you discussed this with your provider? Not needed    Acute Infection Status:    Acute infections noted within Epic:  Rule Out C. Diff    Patient reported infection: None    Therapy Appropriateness:    Is therapy appropriate based on current medication list, adverse reactions, adherence, clinical benefit and progress toward achieving therapeutic goals? Yes, therapy is appropriate and should be continued     Clinical Intervention:    Was an intervention completed as part of this clinical assessment? No    DISEASE/MEDICATION-SPECIFIC INFORMATION      N/A    Solid Organ Transplant: Not Applicable    PATIENT SPECIFIC NEEDS     Does the patient have any physical, cognitive, or cultural barriers? No    Is the patient high risk? No    Does the patient require physician intervention or other additional services (i.e., nutrition, smoking cessation, social work)? No    Does the patient have an additional or emergency contact listed in their chart? Yes    SOCIAL DETERMINANTS OF HEALTH     At the [facility], we have learned that life circumstances - like trouble affording food, housing, utilities, or transportation can affect the health of many of our patients.   That is why we wanted to ask: are you currently experiencing any life circumstances that are negatively impacting your health and/or quality of life? Patient declined to answer    Social Drivers of Health     Food Insecurity: No Food Insecurity (03/10/2021)    Received from [facility]    Hunger Vital Sign     Worried About Running Out of Food in the Last Year: Never true     Ran Out of Food in the Last Year: Never true   Internet Connectivity: Not on file   Housing/Utilities: Not on file   Tobacco Use: Low Risk  (11/26/2022)    Patient History     Smoking Tobacco Use: Never     Smokeless Tobacco Use: Never     Passive Exposure: Not on file   Transportation Needs: No Transportation Needs (03/10/2021)    Received from [facility]    [facility] - Transportation     Lack of Transportation (Medical): No     Lack of Transportation (Non-Medical): No   Alcohol Use: Not on file   Interpersonal Safety: Not on file   Physical Activity: Insufficiently Active (03/10/2021)    Received from [facility]    Exercise Vital Sign     Days  of Exercise per Week: 1 day     Minutes of Exercise per Session: 10 min   Intimate Partner Violence: Not At Risk (03/10/2021)    Received from [facility], [facility]    Humiliation, Afraid, Rape, and Kick questionnaire     Fear of Current or Ex-Partner: No     Emotionally Abused: No     Physically Abused: No     Sexually Abused: No   Stress: Stress Concern Present (03/10/2021)    Received from [facility], [facility]    [facility] - Occupational Stress Questionnaire     Feeling of Stress : Very much   Substance Use: Not on file (04/24/2023)   Social Connections: Socially Isolated (03/10/2021)    Received from [facility], [facility]    Social Connection and Isolation Panel [NHANES]     Frequency of Communication with Friends and Family: Never     Frequency of Social Gatherings with Friends and Family: Never     Attends Religious Services: Never     Database administrator or Organizations: No     Attends Banker Meetings: Never     Marital Status: Divorced   Programmer, applications: Low Risk  (03/10/2021)    Received from [facility]    Overall Financial Resource Strain (CARDIA)     Difficulty of Paying Living Expenses: Not very hard   Depression: Not at risk (07/26/2018)    Received from [facility] visits prior to 08/15/2022., [facility] visits prior to 08/15/2022.    PHQ-2     PHQ-2 Score: 0   Health Literacy: Not on file       Would you be willing to receive help with any of the needs that you have identified today? Not applicable       SHIPPING     Specialty Medication(s) to be Shipped:   Transplant: tacrolimus 0.5mg     Other medication(s) to be shipped: No additional medications requested for fill at this time     Changes to insurance: No    Delivery Scheduled: Yes, Expected medication delivery date: 08/19/2023.     Medication will be delivered via UPS to the confirmed prescription address in [facility].    The patient will receive a drug information handout for each medication shipped and additional FDA Medication Guides as required.  Verified that patient has previously received a Conservation officer, historic buildings and a Surveyor, mining.    The patient or caregiver noted above participated in the development of this care plan and knows that they can request review of or adjustments to the care plan at any time.      All of the patient's questions and concerns have been addressed.    Thad Ranger, PharmD   [facility] Specialty Pharmacist

## 2023-08-18 MED FILL — TACROLIMUS 0.5 MG CAPSULE, IMMEDIATE-RELEASE: ORAL | 30 days supply | Qty: 180 | Fill #8

## 2023-08-18 NOTE — Telephone Encounter (Signed)
 Patients daughter wants to know with her moms current situation is it okay for them to continue waiting on [place] kidney or do they need see somebody else ?

## 2023-08-18 NOTE — Telephone Encounter (Signed)
 Copied from CRM [id number]. Topic: Referral - Question >> Aug 18, 2023 11:13 AM Mackie Pai E wrote: Reason for CRM: Patient's daughter, April, called in regarding patient's nephrology referral. Agent could see that the status was authorized on 2/19. Daughter stated she has called [facility] x5 to schedule a visit and they keep relaying the same message to her, " referrals are ranked by importance.." Daughter is getting a bit worried as her mom does have Stage 4 chronic kidney disease, and questioning if anything else can be done. Call back number for daughter is [phone] to discuss.

## 2023-08-19 ENCOUNTER — Other Ambulatory Visit: Payer: Self-pay | Admitting: Internal Medicine

## 2023-08-19 ENCOUNTER — Telehealth: Payer: Self-pay

## 2023-08-19 DIAGNOSIS — R413 Other amnesia: Secondary | ICD-10-CM | POA: Diagnosis not present

## 2023-08-19 DIAGNOSIS — N184 Chronic kidney disease, stage 4 (severe): Secondary | ICD-10-CM | POA: Diagnosis not present

## 2023-08-19 DIAGNOSIS — R1084 Generalized abdominal pain: Secondary | ICD-10-CM | POA: Diagnosis not present

## 2023-08-19 DIAGNOSIS — I129 Hypertensive chronic kidney disease with stage 1 through stage 4 chronic kidney disease, or unspecified chronic kidney disease: Secondary | ICD-10-CM | POA: Diagnosis not present

## 2023-08-19 DIAGNOSIS — Z944 Liver transplant status: Secondary | ICD-10-CM | POA: Diagnosis not present

## 2023-08-19 DIAGNOSIS — K5900 Constipation, unspecified: Secondary | ICD-10-CM | POA: Diagnosis not present

## 2023-08-19 DIAGNOSIS — K219 Gastro-esophageal reflux disease without esophagitis: Secondary | ICD-10-CM | POA: Diagnosis not present

## 2023-08-19 DIAGNOSIS — E785 Hyperlipidemia, unspecified: Secondary | ICD-10-CM | POA: Diagnosis not present

## 2023-08-19 NOTE — Telephone Encounter (Signed)
 Dr. Yetta Barre please advise.  She has sent multiple message everyday since Monday. I have sent them to you and also advised her that you have a 24-72 hour turn over time to reply to the message. She gave a verbal understanding and states that she would wait until I called back BUT she has still called yet again today and Is very persistent. Please advise for me.

## 2023-08-19 NOTE — Telephone Encounter (Signed)
 Copied from CRM [id number]. Topic: Clinical - Medical Advice >> Aug 19, 2023  9:12 AM Almira Coaster wrote: Reason for CRM: Patient's daughter April Steel is calling regarding recent abdominal pain that pain has started to develop for at least a couple of weeks. She is concerned it may have something to do with her stage 4 kidney disease. She would like to know if there is any way Dr.Jones can fit them into her schedule or at least contact her.

## 2023-08-19 NOTE — Telephone Encounter (Signed)
 Yes

## 2023-08-23 NOTE — Telephone Encounter (Signed)
 Advised patients daughter tat Dr. Yetta Barre placed a new referral she states that thats not neccessary and she will just wait for [place] [facility]

## 2023-08-25 ENCOUNTER — Encounter: Payer: Self-pay | Admitting: Psychology

## 2023-08-25 DIAGNOSIS — R109 Unspecified abdominal pain: Secondary | ICD-10-CM | POA: Insufficient documentation

## 2023-08-26 ENCOUNTER — Ambulatory Visit: Payer: Self-pay

## 2023-08-26 ENCOUNTER — Emergency Department (HOSPITAL_COMMUNITY)
Admission: EM | Admit: 2023-08-26 | Discharge: 2023-08-26 | Disposition: A | Discharge status: Home / Self Care | Attending: Emergency Medicine | Admitting: Emergency Medicine

## 2023-08-26 ENCOUNTER — Ambulatory Visit: Payer: Medicare Other | Admitting: Psychology

## 2023-08-26 ENCOUNTER — Other Ambulatory Visit: Payer: Self-pay

## 2023-08-26 DIAGNOSIS — I10 Essential (primary) hypertension: Secondary | ICD-10-CM | POA: Insufficient documentation

## 2023-08-26 DIAGNOSIS — I959 Hypotension, unspecified: Secondary | ICD-10-CM | POA: Diagnosis not present

## 2023-08-26 DIAGNOSIS — R531 Weakness: Secondary | ICD-10-CM | POA: Diagnosis present

## 2023-08-26 DIAGNOSIS — I6381 Other cerebral infarction due to occlusion or stenosis of small artery: Secondary | ICD-10-CM

## 2023-08-26 DIAGNOSIS — Z7982 Long term (current) use of aspirin: Secondary | ICD-10-CM | POA: Insufficient documentation

## 2023-08-26 DIAGNOSIS — F0670 Mild neurocognitive disorder due to known physiological condition without behavioral disturbance: Secondary | ICD-10-CM

## 2023-08-26 LAB — COMPREHENSIVE METABOLIC PANEL
ALT: 12 U/L (ref 0–44)
AST: 20 U/L (ref 15–41)
Albumin: 4 g/dL (ref 3.5–5.0)
Alkaline Phosphatase: 47 U/L (ref 38–126)
Anion gap: 15 (ref 5–15)
BUN: 20 mg/dL (ref 8–23)
CO2: 22 mmol/L (ref 22–32)
Calcium: 9.2 mg/dL (ref 8.9–10.3)
Chloride: 103 mmol/L (ref 98–111)
Creatinine, Ser: 1.33 mg/dL — ABNORMAL HIGH (ref 0.44–1.00)
GFR, Estimated: 42 mL/min — ABNORMAL LOW (ref >60)
Glucose, Bld: 109 mg/dL — ABNORMAL HIGH (ref 70–99)
Potassium: 4.1 mmol/L (ref 3.5–5.1)
Sodium: 140 mmol/L (ref 135–145)
Total Bilirubin: 0.5 mg/dL (ref 0.0–1.2)
Total Protein: 6.8 g/dL (ref 6.5–8.1)

## 2023-08-26 LAB — CBC WITH DIFFERENTIAL/PLATELET
Abs Immature Granulocytes: 0.02 10*3/uL (ref 0.00–0.07)
Basophils Absolute: 0.1 10*3/uL (ref 0.0–0.1)
Basophils Relative: 1 %
Eosinophils Absolute: 0.1 10*3/uL (ref 0.0–0.5)
Eosinophils Relative: 1 %
HCT: 38.6 % (ref 36.0–46.0)
Hemoglobin: 13 g/dL (ref 12.0–15.0)
Immature Granulocytes: 0 %
Lymphocytes Relative: 18 %
Lymphs Abs: 1.5 10*3/uL (ref 0.7–4.0)
MCH: 30.2 pg (ref 26.0–34.0)
MCHC: 33.7 g/dL (ref 30.0–36.0)
MCV: 89.6 fL (ref 80.0–100.0)
Monocytes Absolute: 0.7 10*3/uL (ref 0.1–1.0)
Monocytes Relative: 8 %
Neutro Abs: 6 10*3/uL (ref 1.7–7.7)
Neutrophils Relative %: 72 %
Platelets: 206 10*3/uL (ref 150–400)
RBC: 4.31 MIL/uL (ref 3.87–5.11)
RDW: 13.2 % (ref 11.5–15.5)
WBC: 8.4 10*3/uL (ref 4.0–10.5)
nRBC: 0 % (ref 0.0–0.2)

## 2023-08-26 NOTE — Discharge Instructions (Addendum)
 I sent a referral to cardiology.  They will contact you to schedule evaluation

## 2023-08-26 NOTE — ED Provider Triage Note (Signed)
 Emergency Medicine Provider Triage Evaluation Note  Candace Hill , a 78 y.o. female  was evaluated in triage.  Pt complains of hypotension.  Patient's daughter is at bedside and states that they were getting out of the car, going to neurology appointment wants her to feel dizzy.  They took her blood pressure and it was 77/50.  They advised her to bring to the ED for further evaluation.  Patient still endorses some dizziness at the time.  Also with history of orthostatic hypotension in the past.  States she takes olmesartan for her blood pressure but does not reverse took it this morning or not.  Review of Systems  Positive: Hypertension, dizziness Negative: Chest pain, shortness of breath  Physical Exam  BP (!) 91/54   Pulse 72   Temp 97.8 F (36.6 C)   Resp 18   Ht 5\' 3"  (1.6 m)   Wt 60.8 kg   SpO2 99%   BMI 23.74 kg/m  Gen:   Awake, no distress   Resp:  Normal effort  MSK:   Moves extremities without difficulty  Other:  No focal neurologic deficits.  Medical Decision Making  Medically screening exam initiated at 2:55 PM.  Appropriate orders placed.  [name] was informed that the remainder of the evaluation will be completed by another provider, this initial triage assessment does not replace that evaluation, and the importance of remaining in the ED until their evaluation is complete.  Blood pressure of 106/62 on my evaluation in triage.   Maxwell Marion, PA-C 08/26/23 [phone]

## 2023-08-26 NOTE — ED Notes (Addendum)
 Pt ambulated by this RN. Patient steady with one assist. No dizziness reported. PA [place] notified and aware.

## 2023-08-26 NOTE — ED Provider Notes (Signed)
 [place] EMERGENCY DEPARTMENT AT [facility] Provider Note   CSN: [id number] Arrival date & time: 08/26/23  1322     History  Chief Complaint  Patient presents with   Hypotension   Weakness    Candace Hill is a 78 y.o. female.  Patient complains of weakness today.  Patient had an appointment at the neuro psychiatrist and her blood pressure was low.  They decided to send her to the emergency department due to her low blood pressure.  Patient reports that she has recently had problems with low blood pressure her primary care doctor had dropped her losartan from 20 mg to 10 mg.  Patient states she did take her medicine this morning.  Patient's daughter is unsure if patient could have taken extra medication or if she took her regular dose.  Patient's daughter does put out her medications for her.  Patient has a past medical history of vascular dementia and stage IV renal disease.  Patient's daughter is concerned that the blood pressure is because of patient's kidney disease.  Patient's daughter also reports patient has never been seen by a cardiologist she worries that as she has vascular brain disease that she may also have heart disease.  Patient lives alone and her daughter checks on her.  She states patient stays up all night and sleeps during the day.  The history is provided by the patient and a relative. No language interpreter was used.  Weakness Severity:  Moderate Onset quality:  Gradual Timing:  Constant Progression:  Worsening Chronicity:  New Relieved by:  Nothing Worsened by:  Nothing Ineffective treatments:  None tried Associated symptoms: no abdominal pain and no fever        Home Medications Prior to Admission medications   Medication Sig Start Date End Date Taking? Authorizing Provider  aspirin EC 81 MG tablet Take 81 mg by mouth daily.    [provider]  atorvastatin (LIPITOR) 10 MG tablet TAKE 1 TABLET EVERY DAY 05/15/23   Etta Grandchild, MD  CALCIUM-VITAMIN D PO Take 600 mg by mouth daily.    [provider]  Ferrous Sulfate (IRON) 325 (65 Fe) MG TABS Take 1 tablet by mouth daily.    [provider]  folic acid (FOLVITE) 400 MCG tablet Take 800 mcg by mouth daily.    [provider]  lamoTRIgine (LAMICTAL) 25 MG tablet Take 1 tablet (25 mg total) by mouth daily. 07/30/23   Bobbye Morton, MD  memantine (NAMENDA XR) 21 MG CP24 24 hr capsule TAKE 1 CAPSULE(21 MG) BY MOUTH DAILY 07/12/23   Etta Grandchild, MD  olmesartan (BENICAR) 5 MG tablet Take 2 tablets (10 mg total) by mouth daily. 06/15/23   Etta Grandchild, MD  sertraline (ZOLOFT) 100 MG tablet Take 1 tablet (100 mg total) by mouth daily. Take with 50mg  tablet daily 07/30/23   Bobbye Morton, MD  sertraline (ZOLOFT) 50 MG tablet Take 1 tablet (50 mg total) by mouth daily. Take with 100mg  tablet daily. 07/30/23   Bobbye Morton, MD  tacrolimus (PROGRAF) 0.5 MG capsule 6 by mouth daily NAME BRAND ONLY    [provider]      Allergies    Codeine, Other, Penicillins, Phenergan [promethazine], Sulfa antibiotics, Tamsulosin, and Lidocaine    Review of Systems   Review of Systems  Constitutional:  Negative for activity change, appetite change and fever.  HENT:  Negative for congestion.   Eyes:  Negative for  visual disturbance.  Gastrointestinal:  Negative for abdominal pain.  Musculoskeletal:  Negative for back pain and gait problem.  Neurological:  Positive for weakness.  All other systems reviewed and are negative.   Physical Exam Updated Vital Signs BP (!) 111/59 (BP Location: Right Arm)   Pulse 63   Temp 98.2 F (36.8 C) (Oral)   Resp 18   Ht 5\' 3"  (1.6 m)   Wt 60.8 kg   SpO2 99%   BMI 23.74 kg/m  Physical Exam Vitals and nursing note reviewed.  Constitutional:      Appearance: She is well-developed.  HENT:     Head: Normocephalic.     Right Ear: Tympanic membrane normal.     Left Ear: Tympanic membrane normal.      Nose: Nose normal.     Mouth/Throat:     Mouth: Mucous membranes are moist.  Cardiovascular:     Rate and Rhythm: Normal rate.  Pulmonary:     Effort: Pulmonary effort is normal.  Abdominal:     General: There is no distension.  Musculoskeletal:        General: Normal range of motion.     Cervical back: Normal range of motion.  Skin:    General: Skin is warm.  Neurological:     General: No focal deficit present.     Mental Status: She is alert. Mental status is at baseline.  Psychiatric:        Mood and Affect: Mood normal.     ED Results / Procedures / Treatments   Labs (all labs ordered are listed, but only abnormal results are displayed) Labs Reviewed  COMPREHENSIVE METABOLIC PANEL - Abnormal; Notable for the following components:      Result Value   Glucose, Bld 109 (*)    Creatinine, Ser 1.33 (*)    GFR, Estimated 42 (*)    All other components within normal limits  CBC WITH DIFFERENTIAL/PLATELET    EKG None  Radiology No results found.  Procedures Procedures    Medications Ordered in ED Medications - No data to display  ED Course/ Medical Decision Making/ A&P                                 Medical Decision Making Patient sent to the emergency department from her neuro psychiatrist because her blood pressure was low.  Patient's primary care doctor has recently been changing her medication because her blood pressure has been low.  Amount and/or Complexity of Data Reviewed Independent Historian: caregiver    Details: Patient is here with her daughter who is supportive Labs: ordered. Decision-making details documented in ED Course.    Details: Labs ordered reviewed and interpreted.  Patient's GFR is 42 glucose is 109 creatinine is 1.33. ECG/medicine tests: ordered and independent interpretation performed. Decision-making details documented in ED Course.    Details: EKG normal sinus no acute changes  Risk Risk Details: Patient is able to ambulate.   Patient's blood pressure has increased without any intervention while in the emergency department.  Patient is now slightly hypertensive.  I discussed with the patient and her daughter following up with her primary care physician for ongoing blood pressure management I will place a referral to cardiology and his daughter would like for patient to be seen by cardiology.           Final Clinical Impression(s) / ED Diagnoses Final diagnoses:  Hypotension, unspecified  hypotension type  Hypertension, unspecified type    Rx / DC Orders ED Discharge Orders     None      An After Visit Summary was printed and given to the patient.    Elson Areas, [place] 08/26/23 Cindy Hazy, MD 08/26/23 2351

## 2023-08-26 NOTE — ED Triage Notes (Signed)
 Pt/daughter stated, she was dx with vascular dementia and was in Dr.'s office and they sent  me here. Im also very fatigue and Stage 4 kidney failure.

## 2023-08-26 NOTE — Progress Notes (Signed)
   Neuropsychology Note [place]. [facility] Department of Neurology     Mr. Romagnoli arrived for her outpatient neuropsychological evaluation this afternoon. Upon arrival, her daughter reported Ms. Amoroso exhibiting significant dizziness, balance instability (to the point a wheelchair was required), and confusion. Her daughter requested a blood pressure check and Ms. Reppert's blood pressure reading was 77/45. Due to significant hypotension, Ms. Kreger was advised to go to the ED immediately for further evaluation. Her daughter agreed and expressed her intention to take her right away.  Her outpatient neuropsychological evaluation will be rescheduled at her earliest convenience.

## 2023-08-30 ENCOUNTER — Telehealth: Payer: Self-pay | Admitting: *Deleted

## 2023-08-30 NOTE — Telephone Encounter (Signed)
 Pt called regarding being alerted by MyChart that she had been scheduled to a dermatologist instead of cardiologist.  RNCM reviewed chart and found referral to cardiology was placed.  RNCM contacted dermatology office on pt behalf to find that referral was mad in August by pt PCP.  RNCM returned call to to with new information.  Pt then recalled conversation with PCP in the summer.  RNCM advised to call dermatologist office should she need to cancel or reschedule.

## 2023-09-02 ENCOUNTER — Encounter: Payer: Medicare Other | Admitting: Psychology

## 2023-09-10 ENCOUNTER — Ambulatory Visit (INDEPENDENT_AMBULATORY_CARE_PROVIDER_SITE_OTHER): Payer: Medicare Other | Admitting: Student in an Organized Health Care Education/Training Program

## 2023-09-10 ENCOUNTER — Ambulatory Visit: Payer: Self-pay

## 2023-09-10 ENCOUNTER — Other Ambulatory Visit: Payer: Self-pay

## 2023-09-10 ENCOUNTER — Telehealth (HOSPITAL_COMMUNITY): Payer: Self-pay

## 2023-09-10 ENCOUNTER — Encounter (HOSPITAL_COMMUNITY): Payer: Self-pay | Admitting: Emergency Medicine

## 2023-09-10 ENCOUNTER — Emergency Department (HOSPITAL_COMMUNITY)
Admission: EM | Admit: 2023-09-10 | Discharge: 2023-09-10 | Disposition: A | Discharge status: Home / Self Care | Attending: Emergency Medicine | Admitting: Emergency Medicine

## 2023-09-10 VITALS — BP 130/74 | HR 73 | Wt 132.0 lb

## 2023-09-10 DIAGNOSIS — R42 Dizziness and giddiness: Secondary | ICD-10-CM | POA: Insufficient documentation

## 2023-09-10 DIAGNOSIS — R404 Transient alteration of awareness: Secondary | ICD-10-CM | POA: Diagnosis not present

## 2023-09-10 DIAGNOSIS — I499 Cardiac arrhythmia, unspecified: Secondary | ICD-10-CM | POA: Diagnosis not present

## 2023-09-10 DIAGNOSIS — J45909 Unspecified asthma, uncomplicated: Secondary | ICD-10-CM | POA: Diagnosis not present

## 2023-09-10 DIAGNOSIS — R55 Syncope and collapse: Secondary | ICD-10-CM | POA: Diagnosis not present

## 2023-09-10 DIAGNOSIS — N184 Chronic kidney disease, stage 4 (severe): Secondary | ICD-10-CM | POA: Insufficient documentation

## 2023-09-10 DIAGNOSIS — Z8505 Personal history of malignant neoplasm of liver: Secondary | ICD-10-CM | POA: Diagnosis not present

## 2023-09-10 DIAGNOSIS — F331 Major depressive disorder, recurrent, moderate: Secondary | ICD-10-CM

## 2023-09-10 DIAGNOSIS — I4430 Unspecified atrioventricular block: Secondary | ICD-10-CM | POA: Diagnosis not present

## 2023-09-10 DIAGNOSIS — F411 Generalized anxiety disorder: Secondary | ICD-10-CM

## 2023-09-10 DIAGNOSIS — I129 Hypertensive chronic kidney disease with stage 1 through stage 4 chronic kidney disease, or unspecified chronic kidney disease: Secondary | ICD-10-CM | POA: Insufficient documentation

## 2023-09-10 DIAGNOSIS — R6889 Other general symptoms and signs: Secondary | ICD-10-CM | POA: Diagnosis not present

## 2023-09-10 DIAGNOSIS — Z743 Need for continuous supervision: Secondary | ICD-10-CM | POA: Diagnosis not present

## 2023-09-10 DIAGNOSIS — F0670 Mild neurocognitive disorder due to known physiological condition without behavioral disturbance: Secondary | ICD-10-CM

## 2023-09-10 LAB — CBC WITH DIFFERENTIAL/PLATELET
Abs Immature Granulocytes: 0.02 10*3/uL (ref 0.00–0.07)
Basophils Absolute: 0 10*3/uL (ref 0.0–0.1)
Basophils Relative: 0 %
Eosinophils Absolute: 0.1 10*3/uL (ref 0.0–0.5)
Eosinophils Relative: 1 %
HCT: 34.8 % — ABNORMAL LOW (ref 36.0–46.0)
Hemoglobin: 11.8 g/dL — ABNORMAL LOW (ref 12.0–15.0)
Immature Granulocytes: 0 %
Lymphocytes Relative: 20 %
Lymphs Abs: 1.3 10*3/uL (ref 0.7–4.0)
MCH: 30.9 pg (ref 26.0–34.0)
MCHC: 33.9 g/dL (ref 30.0–36.0)
MCV: 91.1 fL (ref 80.0–100.0)
Monocytes Absolute: 0.5 10*3/uL (ref 0.1–1.0)
Monocytes Relative: 9 %
Neutro Abs: 4.3 10*3/uL (ref 1.7–7.7)
Neutrophils Relative %: 70 %
Platelets: 155 10*3/uL (ref 150–400)
RBC: 3.82 MIL/uL — ABNORMAL LOW (ref 3.87–5.11)
RDW: 13.4 % (ref 11.5–15.5)
WBC: 6.3 10*3/uL (ref 4.0–10.5)
nRBC: 0 % (ref 0.0–0.2)

## 2023-09-10 LAB — COMPREHENSIVE METABOLIC PANEL WITH GFR
ALT: 22 U/L (ref 0–44)
AST: 33 U/L (ref 15–41)
Albumin: 3.6 g/dL (ref 3.5–5.0)
Alkaline Phosphatase: 40 U/L (ref 38–126)
Anion gap: 10 (ref 5–15)
BUN: 16 mg/dL (ref 8–23)
CO2: 21 mmol/L — ABNORMAL LOW (ref 22–32)
Calcium: 8.6 mg/dL — ABNORMAL LOW (ref 8.9–10.3)
Chloride: 107 mmol/L (ref 98–111)
Creatinine, Ser: 1.54 mg/dL — ABNORMAL HIGH (ref 0.44–1.00)
GFR, Estimated: 35 mL/min — ABNORMAL LOW (ref >60)
Glucose, Bld: 99 mg/dL (ref 70–99)
Potassium: 5.1 mmol/L (ref 3.5–5.1)
Sodium: 138 mmol/L (ref 135–145)
Total Bilirubin: 0.7 mg/dL (ref 0.0–1.2)
Total Protein: 5.6 g/dL — ABNORMAL LOW (ref 6.5–8.1)

## 2023-09-10 LAB — URINALYSIS, ROUTINE W REFLEX MICROSCOPIC
Bilirubin Urine: NEGATIVE
Glucose, UA: NEGATIVE mg/dL
Hgb urine dipstick: NEGATIVE
Ketones, ur: NEGATIVE mg/dL
Nitrite: NEGATIVE
Protein, ur: NEGATIVE mg/dL
Specific Gravity, Urine: 1.012 (ref 1.005–1.030)
pH: 6 (ref 5.0–8.0)

## 2023-09-10 LAB — TROPONIN I (HIGH SENSITIVITY)
Troponin I (High Sensitivity): 9 ng/L (ref <18)
Troponin I (High Sensitivity): 8 ng/L (ref <18)

## 2023-09-10 LAB — LIPASE, BLOOD: Lipase: 24 U/L (ref 11–51)

## 2023-09-10 MED ORDER — SERTRALINE HCL 50 MG PO TABS: 50.0000 mg | ORAL_TABLET | Freq: Every day | ORAL | 2 refills | Status: DC

## 2023-09-10 MED ORDER — LAMOTRIGINE 25 MG PO TABS: 25.0000 mg | ORAL_TABLET | Freq: Every day | ORAL | 1 refills | Status: DC

## 2023-09-10 MED ORDER — SERTRALINE HCL 100 MG PO TABS
100.0000 mg | ORAL_TABLET | Freq: Every day | ORAL | 2 refills | Status: DC
Start: 2023-09-10 — End: 2023-10-15

## 2023-09-10 MED ORDER — SODIUM CHLORIDE 0.9 % IV BOLUS
1000.0000 mL | Freq: Once | INTRAVENOUS | Status: AC
  Administered 2023-09-10: 1000 mL via INTRAVENOUS

## 2023-09-10 NOTE — Discharge Instructions (Signed)
 Please follow with your primary care doctor in the coming week.  I have also placed a referral in our system to the cardiology doctors with your passing out or almost passing out episodes.  Return with any new or suddenly worsening symptoms.
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
## 2023-09-10 NOTE — Telephone Encounter (Signed)
  Chief Complaint: hypotension Symptoms: fatigue/sluggish Frequency: x 1 day Pertinent Negatives: Patient denies N/A. Disposition: [x] ED /[] Urgent Care (no appt availability in office) / [] Appointment(In office/virtual)/ []  [facility]/ [] Home Care/ [] Refused Recommended Disposition /[] [place] [facility]/ []  Follow-up with PCP Additional Notes:Daughter called and states patient is at a doctor's appointment with her granddaughter and almost fell over, she asked them to check her BP and it was 90/50. Family member mentions she checked on the patient last night and she seemed sluggish and fatigued. She states the granddaughter doesn't feel like she can safely get the patient up from sitting. Advised call 911 and go to ED.  Copied from CRM [phone] [id number]. Topic: Clinical - Red Word Triage >> Sep 10, 2023  2:43 PM Elizebeth Brooking wrote: [facility] that prompted transfer to Nurse Triage: Patient daugther called in stating patient was at PT and almost fall because blood pressure went down to 90/50 . Wanted to know if she needs to be seen at the doctors office or go to the ER Reason for Disposition  Sounds like a life-threatening emergency to the triager  Answer Assessment - Initial Assessment Questions 1. BLOOD PRESSURE: "What is the blood pressure?" "Did you take at least two measurements 5 minutes apart?"     90/50.  2. ONSET: "When did you take your blood pressure?"     20 minutes ago.  3. HOW: "How did you obtain the blood pressure?" (e.g., visiting nurse, automatic home BP monitor)     Taken at the doctors office.  4. HISTORY: "Do you have a history of low blood pressure?" "What is your blood pressure normally?"     Denies, history of high blood pressure.  5. MEDICINES: "Are you taking any medications for blood pressure?" If Yes, ask: "Have they been changed recently?"     Yes, patient is on olmesartan, unsure if she took the dose today.  6. PULSE RATE: "Do you know what your pulse rate  is?"      Unsure.  7. OTHER SYMPTOMS: "Have you been sick recently?" "Have you had a recent injury?"     Fatigue/sluggish.  8. PREGNANCY: "Is there any chance you are pregnant?" "When was your last menstrual period?"     N/A.  Protocols used: Blood Pressure - Low-A-AH

## 2023-09-10 NOTE — Telephone Encounter (Signed)
 Hello,     Pt/ Pharmacy is requesting for a refill of LAMOTRIGINE 25mg  to be sent to pharmacy.   Last seen 2/14.  Next app 3/28.     JNL

## 2023-09-10 NOTE — ED Triage Notes (Signed)
 Pt BIB [facility] from physical therapy due to dizziness while trying to learn to use a cane.  Assisted to the ground.  No LOC.  No trauma. 18g left hand.  Hx dementia. VS 95/50, HR 75, CBG 143, Spo2 98%

## 2023-09-10 NOTE — ED Provider Notes (Signed)
 Emergency Department Provider Note   I have reviewed the triage vital signs and the nursing notes.   HISTORY  Chief Complaint Dizziness   HPI Candace Hill is a 78 y.o. female with past history reviewed below presents to the emergency department with return of lightheadedness and low blood pressures.  She was seen earlier this month with similar presentation.  Her blood pressure medication had been titrated downward.  She is been compliant with her home medicines.  No vomiting or diarrhea.  She was out getting a cane and going for training on how to use the cane for increased stability when she felt more lightheaded and pre-syncopal. No full syncope.  She was assisted to the ground without fall.  Vital signs on scene were systolic blood pressure of 95 with no hypoxemia or tachycardia.  Blood sugar normal on scene.   Past Medical History:  Diagnosis Date   Abdominal pain    Acute cystitis without hematuria 03/12/2022   Allergic rhinitis 11/14/2012   Amblyopia of eye, left 06/17/2017   Asbestos exposure 04/08/2018   Asthma    [id number] due to black mold house, no asthma now   Atherosclerosis of aorta 01/28/2023   Broken ankle    right   Broken rib    x2   Cataract    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 03/26/2011   Chronic hepatitis C virus infection 05/31/2013   Complication of anesthesia    IV phenergan  serious mental reaction   Delusional thoughts 03/18/2022   Diarrhea 03/13/2022   Dysuria 07/29/2023   Essential hypertension 10/14/2017   Gastroesophageal reflux disease 10/14/2017   Generalized anxiety disorder    Hernia, femoral    left   History of blood transfusion 1971   acquired hepatitis c   History of kidney stones    History of liver transplant 11/14/2012   History of nephrolithiasis 10/14/2017   History of shingles    Hyperglycemia 04/08/2018   Hyperlipidemia 01/15/2021   The 10-year ASCVD risk score (Arnett DK, et al.,  2019) is: 16.2%    Values used to calculate the score:      Age: 32 years      Sex: Female      Is Non-Hispanic African American: No      Diabetic: No      Tobacco smoker: No      Systolic Blood Pressure: 136 mmHg      Is BP treated: No      HDL Cholesterol: 51.2 mg/dL      Total Cholesterol: 202 mg/dL     Intrinsic eczema 54/02/8118   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   Major depressive disorder 11/14/2012   Malignant neoplasm of liver 11/14/2012   Migraine 07/02/2015   Mild neurocognitive disorder due to multiple etiologies 03/18/2022   Overweight (BMI 25.0-29.9) 06/28/2015   PCO (posterior capsular opacification), right 03/26/2011   Seasonal affective disorder    Splenic artery aneurysm    Stage 4 chronic kidney disease 07/29/2023   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 Positive near syncope.  Respiratory: Denies shortness of breath. Gastrointestinal: No abdominal pain.  No nausea, no vomiting.  Skin: Negative for rash. Neurological: Negative for headaches, focal weakness or numbness.  ____________________________________________   PHYSICAL EXAM:  VITAL SIGNS: ED Triage Vitals  Encounter Vitals Group     BP 09/10/23 1544 (!) 103/50     Pulse Rate 09/10/23 1544 64     Resp 09/10/23 1544 16     Temp 09/10/23 1544 98.1 F (36.7 C)     Temp Source 09/10/23 1544 Oral     SpO2 09/10/23 1544 100 %   Constitutional: Alert and oriented. Well appearing and in no acute distress. Eyes: Conjunctivae are normal. Head: Atraumatic. Nose: No congestion/rhinnorhea. Mouth/Throat: Mucous membranes are moist. Neck: No stridor.   Cardiovascular: Normal rate, regular rhythm.  Good peripheral circulation. Grossly normal heart sounds.   Respiratory: Normal respiratory effort.  No retractions. Lungs CTAB. Gastrointestinal: Soft and nontender. No distention.  Musculoskeletal: No lower extremity tenderness nor edema. No gross deformities of extremities. Neurologic:  Normal speech and language. No gross focal neurologic deficits are appreciated.  Skin:  Skin is warm, dry and intact. No rash noted.  ____________________________________________   LABS (all labs ordered are listed, but only abnormal results are displayed)  Labs Reviewed  COMPREHENSIVE METABOLIC PANEL WITH GFR - Abnormal; Notable for the following components:      Result Value   CO2 21 (*)    Creatinine, Ser 1.54 (*)    Calcium 8.6 (*)    Total Protein 5.6 (*)    GFR, Estimated 35 (*)    All other components within normal limits  CBC WITH DIFFERENTIAL/PLATELET - Abnormal; Notable for the following components:   RBC 3.82 (*)    Hemoglobin 11.8 (*)    HCT 34.8 (*)    All other components within normal limits  URINALYSIS, ROUTINE W REFLEX MICROSCOPIC - Abnormal; Notable for the following components:   Leukocytes,Ua TRACE (*)    Bacteria, UA RARE (*)    All other components within normal limits  LIPASE, BLOOD  TROPONIN I (HIGH SENSITIVITY)  TROPONIN I (HIGH SENSITIVITY)   ____________________________________________  EKG   EKG Interpretation Date/Time:  Friday September 10 2023 16:13:19 EDT Ventricular Rate:  69 PR Interval:  260 QRS Duration:  82 QT Interval:  394 QTC Calculation: 423 R Axis:   77  Text Interpretation: Sinus rhythm Prolonged PR interval Nonspecific T abnormalities, lateral leads Confirmed by Alona Bene [phone]) on 09/10/2023 4:19:38 PM Also confirmed by Alona Bene [phone]), editor Harmon Pier, Ashwini ([phone])  on 09/11/2023 8:13:22 AM       ____________________________________________   PROCEDURES  Procedure(s) performed:   Procedures  None   ____________________________________________   INITIAL IMPRESSION / ASSESSMENT AND PLAN / ED COURSE  Pertinent labs & imaging results that were available during my care of the patient were reviewed by me and considered in my medical decision making (see chart for details).   This patient is Presenting for Evaluation of lightheadedness, which does require a range of treatment options, and is a complaint that involves a high risk of morbidity and mortality.  The Differential Diagnoses include dehydration, medication side effect, arrhythmia, ACS, CVA, GI losses/dehydration, sepsis, etc.  Critical Interventions-    Medications  sodium chloride 0.9 % bolus 1,000 mL (0 mLs Intravenous Stopped 09/10/23 1724)    Reassessment after intervention: BP  improved.   Clinical Laboratory Tests Ordered, included troponin normal.  CMP shows mild elevation in creatinine to 1.5 similar to prior values.  No UTI.  No severe anemia.  Radiologic Tests: No chest pain or shortness of breath.  No focal neurodeficits.  Defer imaging for now.  Cardiac Monitor Tracing which shows NSR.    Social Determinants of Health Risk patient is a non-smoker.   Medical Decision Making: Summary:  Patient presents to the emergency department with lightheadedness requiring her to be lowered to the ground today while at an appointment.  No falls or trauma.  No GI losses.  Similar presentation 2 weeks prior.  Plan for IV fluids and screening blood work.  EKG shows normal sinus rhythm, normal rate, normal intervals, no acute ischemic change.  Reevaluation with update and discussion with patient.  Feeling better after IV fluids and ambulatory here.  Plan for close PCP follow-up.  Considered admission but ED workup is reassuring and appears stable for outpatient follow-up.  Patient's presentation is most consistent with acute presentation with potential threat to life or bodily function.   Disposition:  discharge  ____________________________________________  FINAL CLINICAL IMPRESSION(S) / ED DIAGNOSES  Final diagnoses:  Near syncope    Note:  This document was prepared using Dragon voice recognition software and may include unintentional dictation errors.  Alona Bene, MD, [facility] Emergency Medicine    Jersey Espinoza, Arlyss Repress, MD 09/13/23 [phone]

## 2023-09-14 ENCOUNTER — Encounter: Payer: Self-pay | Admitting: Internal Medicine

## 2023-09-14 ENCOUNTER — Ambulatory Visit (INDEPENDENT_AMBULATORY_CARE_PROVIDER_SITE_OTHER): Payer: Medicare HMO | Admitting: Internal Medicine

## 2023-09-14 VITALS — BP 116/58 | HR 66 | Temp 98.0°F | Resp 16 | Ht 63.0 in | Wt 133.6 lb

## 2023-09-14 DIAGNOSIS — I10 Essential (primary) hypertension: Secondary | ICD-10-CM | POA: Diagnosis not present

## 2023-09-14 DIAGNOSIS — N184 Chronic kidney disease, stage 4 (severe): Secondary | ICD-10-CM

## 2023-09-14 NOTE — Progress Notes (Signed)
 Subjective:  Patient ID: Candace Hill, female    DOB: 24-Oct-1945  Age: 78 y.o. MRN: [id number]  CC: Anemia   HPI Candace Hill presents for f/up -----  Discussed the use of AI scribe software for clinical note transcription with the patient, who gave verbal consent to proceed.  History of Present Illness   Candace Hill "Candace Hill" is a 78 year old female with hypertension and chronic kidney disease who presents with symptoms of low blood pressure and weakness. She is accompanied by her daughter, who is also her primary caregiver.  She experiences weakness and lightheadedness, particularly when her blood pressure is low. No chest pain is present, but she has difficulty walking, necessitating the use of a cane. She recalls an incident where an ambulance was called due to severely low blood pressure.  She is currently taking 10 mg of Olmesartan daily for hypertension. However, she has observed fluctuations in her blood pressure readings, with home measurements sometimes reaching the 140s, while at other times, it drops significantly, such as a recent reading of 90. She is concerned about the impact of her medication on her blood pressure levels.  She has a history of chronic kidney disease, currently at stage four, and is awaiting an appointment with a kidney specialist. Her daughter mentions that she is in contact with [facility] for a potential earlier consultation. Her kidney function has declined, with recent hospital checks showing a creatinine level that indicates stage four kidney disease.  She has a history of liver disease and has undergone a liver transplant. Her daughter notes that her liver disease may have contributed to her current kidney issues. She is also on an iron supplement and a new anti-anxiety medication, Lamictal, prescribed by her psychiatrist.  She feels weak and drained, particularly in the mornings, but notes improvement as the day progresses. Her  daughter observes that she becomes more alert and energetic in the evenings. She has been trying to increase her water intake despite not being fond of water, and she is cautious about her salt intake.       Outpatient Medications Prior to Visit  Medication Sig Dispense Refill   aspirin EC 81 MG tablet Take 81 mg by mouth daily.     atorvastatin (LIPITOR) 10 MG tablet TAKE 1 TABLET EVERY DAY 90 tablet 1   CALCIUM-VITAMIN D PO Take 600 mg by mouth daily.     Ferrous Sulfate (IRON) 325 (65 Fe) MG TABS Take 1 tablet by mouth daily.     folic acid (FOLVITE) 400 MCG tablet Take 800 mcg by mouth daily.     lamoTRIgine (LAMICTAL) 25 MG tablet Take 1 tablet (25 mg total) by mouth daily. 25 tablet 1   memantine (NAMENDA XR) 21 MG CP24 24 hr capsule TAKE 1 CAPSULE(21 MG) BY MOUTH DAILY 90 capsule 0   olmesartan (BENICAR) 5 MG tablet Take 2 tablets (10 mg total) by mouth daily. 180 tablet 0   sertraline (ZOLOFT) 100 MG tablet Take 1 tablet (100 mg total) by mouth daily. Take with 50mg  tablet daily 30 tablet 2   sertraline (ZOLOFT) 50 MG tablet Take 1 tablet (50 mg total) by mouth daily. Take with 100mg  tablet daily. 30 tablet 2   tacrolimus (PROGRAF) 0.5 MG capsule 6 by mouth daily NAME BRAND ONLY     No facility-administered medications prior to visit.    ROS Review of Systems  Constitutional:  Positive for fatigue. Negative for appetite change, diaphoresis and unexpected weight  change.  HENT: Negative.    Respiratory: Negative.  Negative for cough, chest tightness, shortness of breath and wheezing.   Cardiovascular:  Negative for chest pain, palpitations and leg swelling.  Gastrointestinal: Negative.  Negative for abdominal pain, constipation, diarrhea, nausea and vomiting.  Endocrine: Negative for polyuria.  Genitourinary: Negative.  Negative for difficulty urinating.  Musculoskeletal: Negative.   Skin: Negative.   Neurological:  Positive for dizziness and weakness. Negative for headaches.   Psychiatric/Behavioral:  Positive for confusion, decreased concentration and sleep disturbance. Negative for suicidal ideas. The patient is not nervous/anxious.     Objective:  BP (!) 116/58 (BP Location: Left Arm, Patient Position: Sitting, Cuff Size: Small)   Pulse 66   Temp 98 F (36.7 C) (Oral)   Resp 16   Ht 5\' 3"  (1.6 m)   Wt 133 lb 9.6 oz (60.6 kg)   SpO2 97%   BMI 23.67 kg/m   BP Readings from Last 3 Encounters:  09/14/23 (!) 116/58  09/10/23 (!) 154/70  08/26/23 128/82    Wt Readings from Last 3 Encounters:  09/14/23 133 lb 9.6 oz (60.6 kg)  08/26/23 134 lb (60.8 kg)  07/28/23 133 lb 12.8 oz (60.7 kg)    Physical Exam Vitals reviewed.  Constitutional:      General: She is not in acute distress.    Appearance: She is ill-appearing. She is not toxic-appearing or diaphoretic.  HENT:     Mouth/Throat:     Mouth: Mucous membranes are moist.  Eyes:     General: No scleral icterus.    Conjunctiva/sclera: Conjunctivae normal.  Cardiovascular:     Rate and Rhythm: Normal rate and regular rhythm.     Heart sounds: No murmur heard.    No friction rub. No gallop.  Pulmonary:     Effort: Pulmonary effort is normal.     Breath sounds: No stridor. No wheezing, rhonchi or rales.  Abdominal:     General: Abdomen is flat.     Palpations: There is no mass.     Tenderness: There is no abdominal tenderness.     Hernia: No hernia is present.  Musculoskeletal:        General: Normal range of motion.     Cervical back: Neck supple.  Lymphadenopathy:     Cervical: No cervical adenopathy.  Skin:    Coloration: Skin is pale.     Findings: No rash.  Neurological:     General: No focal deficit present.     Mental Status: She is alert.     Lab Results  Component Value Date   WBC 6.3 09/10/2023   HGB 11.8 (L) 09/10/2023   HCT 34.8 (L) 09/10/2023   PLT 155 09/10/2023   GLUCOSE 99 09/10/2023   CHOL 161 01/25/2023   TRIG 132.0 01/25/2023   HDL 52.90 01/25/2023    LDLCALC 82 01/25/2023   ALT 22 09/10/2023   AST 33 09/10/2023   NA 138 09/10/2023   K 5.1 09/10/2023   CL 107 09/10/2023   CREATININE 1.54 (H) 09/10/2023   BUN 16 09/10/2023   CO2 21 (L) 09/10/2023   TSH 3.01 01/25/2023   INR 0.9 04/15/2021   HGBA1C 5.4 03/12/2022    No results found.  Assessment & Plan:   Stage 4 chronic kidney disease- Hope she sees nephrology soon.  Essential hypertension- Her BP is over-controlled. I have recommended holding the ARB.     Follow-up: No follow-ups on file.  Sanda Linger, MD

## 2023-09-14 NOTE — Patient Instructions (Signed)
 Chronic Kidney Disease in Adults: What to Know Chronic kidney disease (CKD) is when lasting damage happens to the kidneys slowly over time. The kidneys are two organs that do many important things in the body. These include: Taking waste and extra fluid out of the blood to make pee (urine). Making hormones. Keeping the right amount of fluids and chemicals in the body. A small amount of kidney damage may not cause problems. You must take steps to help keep the kidney damage from getting worse. A lot of damage may cause kidney failure. Kidney failure means the kidneys can no longer work right. What are the causes? Diabetes. High blood pressure. Diseases that affect the heart and blood vessels. Other kidney diseases. Diseases that affect the body's defense system (immune system). A problem with the flow of pee. This may be caused by: Kidney stones. Cancer. An enlarged prostate, in males. A kidney infection or urinary tract infection (UTI) that keeps coming back. What increases the risk? Getting older. The chances of having CKD increase with age. A family history of kidney disease or kidney failure. Having a disease caused by genes. Taking medicines that can harm the kidneys. Being near or having contact with harmful substances. Being very overweight. Using tobacco now or in the past. What are the signs or symptoms? Common symptoms of CKD include: Feeling very tired and having less energy. Swelling of the face, legs, ankles, or feet. Throwing up or feeling like you may throw up. Not wanting to eat as much as normal. Being confused or not able to focus. Twitches and cramps in the leg muscles or other muscles. Dry, itchy skin. Other symptoms may include: Shortness of breath. Trouble sleeping. Making less pee, or making more pee, especially at night. A taste of metal in your mouth. You may also become anemic. Anemia means there's not enough red blood cells in your blood. You may get  symptoms slowly. You may not notice them until the kidney damage gets very bad. How is this diagnosed? CKD may be diagnosed based on: Tests on your blood or pee. Imaging tests, like an ultrasound or a CT scan. A kidney biopsy. For this test, a sample of kidney tissue is removed to be looked at under a microscope. These tests will help to find out how serious the CKD is. How is this treated? Often, there's no cure for CKD. Treatment can help with symptoms and help keep the disease from getting worse. Treatment may include: Treating other problems that are causing your CKD or making it worse. Diet changes. You may need to: Avoid alcohol. Avoid foods that are high in salt, potassium, phosphorous, and protein. Taking medicines for symptoms and to help control other conditions. Dialysis. This treatment gets harmful waste out of your body. It may be needed if you have kidney failure. Follow these instructions at home: Medicines Take your medicines only as told. The amount of some medicines you take may need to be changed. Do not take any new medicines, vitamins, or supplements unless your health care provider says it's okay. These may make kidney damage worse. Lifestyle Do not smoke, vape, or use nicotine or tobacco. If you drink alcohol: Limit how much you have to: 0-1 drink a day if you're female. 0-2 drinks a day if you're female. Know how much alcohol is in your drink. In the [place] one drink is one 12 oz bottle of beer (355 mL), one 5 oz glass of wine (148 mL), or one 1 oz  glass of hard liquor (44 mL). Stay at a healthy weight. If you need help, ask your provider. General instructions  Eat and drink as told. Track your blood pressure at home. Tell your provider about any changes. If you have diabetes, track your blood sugar as told. Exercise at least 30 minutes a day, 5 days a week. Keep your shots (vaccinations) up to date. Keep all follow-up visits. Your provider may need to change  your treatments over time. Where to find support American Kidney Fund: [place].com.au Kidney School: kidneyschool.org American Association of Kidney Patients: https://www.miller-montoya.com/ Where to find more information National Kidney Foundation: kidney.org Centers for Disease Control and Prevention. To learn more: Go to DiningCalendar.de. Click "Search". Type "chronic kidney disease" in the search box. Contact a health care provider if: You have new symptoms. You get symptoms of end-stage kidney disease. These include: Headaches. Numbness in your hands or feet. Leg cramps. Easy bruising. Get help right away if: You have a fever. You make less pee than usual. You have pain or bleeding when you pee or poop. You have chest pain. You have shortness of breath. These symptoms may be an emergency. Call 911 right away. Do not wait to see if the symptoms will go away. Do not drive yourself to the hospital. This information is not intended to replace advice given to you by your health care provider. Make sure you discuss any questions you have with your health care provider. Document Revised: 04/13/2023 Document Reviewed: 12/04/2022 Elsevier Patient Education  2024 [employer].

## 2023-09-16 DIAGNOSIS — N189 Chronic kidney disease, unspecified: Principal | ICD-10-CM

## 2023-09-16 DIAGNOSIS — I10 Essential (primary) hypertension: Principal | ICD-10-CM

## 2023-09-16 NOTE — Unmapped (Signed)
 Patient's daughter contacted [facility], reporting that the patient's pcp had told daughter that the patient is in ESRD. Lab results in Care everywhere from 3/28 show Cr-1.54 and GFR-35. Daughter states that pt's BP has not been well-controlled-that she was started on olmesartan but she became hypotensive and had to be hospitalized, so now it is elevated. Patient has not completed liver txp labs since last June and had reported issues with taking her IS on time. She is living independently at this time, but is suffering from cognitive decline and sleeping for long periods throughout the day, so other options are being explored. Daughter requested urgent referral to [facility] nephrology to optimize patient's renal status. Placed request for urgent nephrology referral at [facility], but daughter may try to pursue local evaluation if she can secure it sooner.

## 2023-09-17 MED FILL — TACROLIMUS 0.5 MG CAPSULE, IMMEDIATE-RELEASE: ORAL | 30 days supply | Qty: 180 | Fill #9

## 2023-09-17 NOTE — Unmapped (Signed)
 [facility] Refill Coordination Note    Specialty Medication(s) to be Shipped:   Transplant: tacrolimus 0.5 mg    Other medication(s) to be shipped: No additional medications requested for fill at this time     Kristy Hanson, DOB: 08-15-1945  Phone: [phone] (home)       All above HIPAA information was verified with patient's family member, Daughter.     Was a Nurse, learning disability used for this call? No    Completed refill call assessment today to schedule patient's medication shipment from the [facility]  [phone]).  All relevant notes have been reviewed.     Specialty medication(s) and dose(s) confirmed: Regimen is correct and unchanged.   Changes to medications: [name] reports no changes at this time.  Changes to insurance: No  New side effects reported not previously addressed with a pharmacist or physician: None reported  Questions for the pharmacist: No    Confirmed patient received a Conservation officer, historic buildings and a Surveyor, mining with first shipment. The patient will receive a drug information handout for each medication shipped and additional FDA Medication Guides as required.       DISEASE/MEDICATION-SPECIFIC INFORMATION        N/A    SPECIALTY MEDICATION ADHERENCE     Medication Adherence    Patient reported X missed doses in the last month: 0  Specialty Medication: tacrolimus 0.5 MG capsule  Patient is on additional specialty medications: No              Were doses missed due to medication being on hold? No    Tacrolimus 0.5 mg: 1 days of medicine on hand        REFERRAL TO PHARMACIST     Referral to the pharmacist: Not needed      [facility]     Shipping address confirmed in Epic.     Cost and Payment: Patient has a copay of $47. They are aware and have authorized the pharmacy to charge the credit card on file.    Delivery Scheduled: Yes, Expected medication delivery date: 09/20/23.     Medication will be delivered via UPS to the prescription address in Epic WAM.    [name]   [facility]  Specialty Technician

## 2023-09-20 NOTE — Unmapped (Addendum)
 Late Entry from 08/23/23: Received message from patient's daughter requesting return call to discuss concern that her mother's IS meds were causing damage to her kidneys. Left VM for patient's daughter, requesting return call to discuss this with this [facility].

## 2023-09-24 ENCOUNTER — Encounter: Payer: Self-pay | Admitting: Psychology

## 2023-09-24 ENCOUNTER — Ambulatory Visit: Admitting: Psychology

## 2023-09-24 ENCOUNTER — Ambulatory Visit

## 2023-09-24 ENCOUNTER — Institutional Professional Consult (permissible substitution): Admitting: Psychology

## 2023-09-24 DIAGNOSIS — F0150 Vascular dementia without behavioral disturbance: Secondary | ICD-10-CM | POA: Insufficient documentation

## 2023-09-24 DIAGNOSIS — F0280 Dementia in other diseases classified elsewhere without behavioral disturbance: Secondary | ICD-10-CM

## 2023-09-24 DIAGNOSIS — I6381 Other cerebral infarction due to occlusion or stenosis of small artery: Secondary | ICD-10-CM | POA: Diagnosis not present

## 2023-09-24 DIAGNOSIS — G309 Alzheimer's disease, unspecified: Secondary | ICD-10-CM

## 2023-09-24 DIAGNOSIS — R4189 Other symptoms and signs involving cognitive functions and awareness: Secondary | ICD-10-CM

## 2023-09-24 HISTORY — DX: Dementia in other diseases classified elsewhere, unspecified severity, without behavioral disturbance, psychotic disturbance, mood disturbance, and anxiety: F02.80

## 2023-09-24 HISTORY — DX: Vascular dementia, unspecified severity, without behavioral disturbance, psychotic disturbance, mood disturbance, and anxiety: F01.50

## 2023-09-24 NOTE — Progress Notes (Signed)
 NEUROPSYCHOLOGICAL EVALUATION [place] Department of Neurology  Date of Evaluation: September 24, 2023  Reason for Referral:   Candace Hill is a 78 y.o. right-handed Caucasian female referred by Tex Filbert, PA-C, to characterize her current cognitive functioning and assist with diagnostic clarity and treatment planning in the context of a prior history of a vascular neurocognitive disorder, psychiatric comorbidities, and concerns for ongoing progressive decline.   Assessment and Plan:   Clinical Impression(s): Candace Hill pattern of performance is suggestive of prominent impairments surrounding processing speed, verbal fluency, and both encoding (i.e., learning) and delayed retrieval aspects of memory. Further performance variability was exhibited across executive functioning, visuospatial abilities, and recognition/consolidation aspects of memory. Performances were appropriate relative to age-matched peers across attention/concentration, receptive language, and confrontation naming. Functionally, her daughter is in charge of medication management and provides reminders for Candace Hill. [name] to take said medications. She is also involved in financial matters and supervision. Candace Hill no longer drives due to visual acuity concerns. 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 She likely remains towards the milder end of this spectrum presently.   Relative to her previous neuropsychological evaluation in October 2023, mild decline was exhibited across processing speed, cognitive flexibility, and delayed retrieval/recognition aspects involving visual memory. Some intra-domain variability was exhibited across basic attention where one task saw decline and another exhibited stability. An improvement was exhibited  across a task assessing receptive language (this performance arose back to 2021 levels). Other assessed cognitive domains exhibited relative stability. Across mood-related questionnaires, Candace Hill did report notably decreased levels of acute anxiety and depression.   The etiology for Candace Hill's mild dementia presentation is likely multifactorial in nature (i.e., a mixed dementia). There is a good likelihood of a notable vascular contribution given neuroimaging suggesting progressive microvascular ischemic disease and a prior small stroke involving the corpus callosum. Medically, Candace Hill has a large degree of chronic medical ailments which also can reasonably impact cognitive functioning (e.g., hypertension, hyperlipidemia, aortic atherosclerosis, stage 4 kidney disease, and vitamin deficiencies). She further noted ongoing sleep dysfunction during interview, including a "flipped" sleep schedule. While questionnaires suggest improved psychiatric symptoms relative to October 2023, she did report ongoing anxiety and depression during the current interview. This combination of chronic medical ailments, psychiatric distress, sleep dysfunction, and cerebrovascular disease can certainly create ongoing cognitive impairment. Her greatest areas of impairment surrounding processing speed, fluency, and the learning aspects of memory align with expectations of this combination well.   I continue to be unable to rule out an underlying neurodegenerative component to her mixed dementia presentation. Going back to her 2021 evaluation, there has been objective evidence for decline surrounding semantic fluency and both delayed retrieval and recognition/consolidation aspects of memory. Across current memory testing. Candace Hill was fully amnestic (i.e., 0% retention) across both list and figure-based memory tasks after a brief delay and performed quite poorly across recognition trials. Amnestic memory and evidence for decline  over time would be unexpected for a pure vascular/medical dementia presentation and does align with expectations surrounding Alzheimer's disease. Decline surrounding semantic fluency also aligns with Alzheimer's disease. Appropriate story retention and confrontation naming performances continues to be encouraging. If there is an underlying Alzheimer's disease component to her current presentation, this aspect appears to be progressing slowly and would still be in earlier stages. Continued medical monitoring will be important moving  forward.   Recommendations: A repeat neuropsychological evaluation in 12-24 months could be considered to assess the trajectory of future cognitive decline. However, it is admittedly unlikely that her formal mixed dementia diagnosis will change.   Candace Hill has already been prescribed a medication aimed to address memory loss and concerns surrounding Alzheimer's disease (i.e., memantine/Namenda). She is encouraged to continue taking this medication as prescribed. It is important to highlight that this medication has been shown to slow functional decline in some individuals. There is no current treatment which can stop or reverse cognitive decline when caused by a neurodegenerative illness.   Performance across neurocognitive testing is not a strong predictor of an individual's safety operating a motor vehicle. Should her family wish to pursue a formalized driving evaluation, they could reach out to the following agencies: [facility] [employer] [facility] in [place]: [phone] [facility]: [phone] [facility]: [phone] [facility]: [phone] or [phone]  It will be important for Candace Hill to have another person with her when in situations where she may need to process information, weigh the pros and cons of different options, and make decisions, in order to ensure that she fully understands and recalls all information to be  considered. She will likely benefit from the establishment and maintenance of a routine in order to maximize her functional abilities over time.  If not already done, Candace Hill and her family may want to discuss her wishes regarding durable power of attorney and medical decision making, so that she can have input into these choices. If they require legal assistance with this, long-term care resource access, or other aspects of estate planning, they could reach out to [employer] at [phone] for a free consultation. Additionally, they may wish to discuss future plans for caretaking and seek out community options for in home/residential care should they become necessary.  Candace Hill is encouraged to attend to lifestyle factors for brain health (e.g., regular physical exercise, good nutrition habits and consideration of the MIND-DASH diet, regular participation in cognitively-stimulating activities, and general stress management techniques), which are likely to have benefits for both emotional adjustment and cognition. In fact, in addition to promoting good general health, regular exercise incorporating aerobic activities (e.g., brisk walking, jogging, cycling, etc.) has been demonstrated to be a very effective treatment for depression and stress, with similar efficacy rates to both antidepressant medication and psychotherapy. Optimal control of vascular risk factors (including safe cardiovascular exercise and adherence to dietary recommendations) is encouraged. Continued participation in activities which provide mental stimulation and social interaction is also recommended.   Important information should be provided to Candace Hill in written format in all instances. This information should be placed in a highly frequented and easily visible location within her home to promote recall. External strategies such as written notes in a consistently used memory journal, visual and nonverbal auditory cues such  as a calendar on the refrigerator or appointments with alarm, such as on a cell phone, can also help maximize recall.  When learning new information, she would benefit from information being broken up into small, manageable pieces. Because she shows better recall for structured information, she will likely understand and retain new information better if it is presented to her in a meaningful or well-organized manner at the outset, such as grouping items into meaningful categories or presenting information in an outlined, bulleted, or story format.  To address problems with processing speed, she may wish to consider:   -Ensuring that she  is alerted when essential material or instructions are being presented   -Adjusting the speed at which new information is presented   -Allowing for more time in comprehending, processing, and responding in conversation   -Repeating and paraphrasing instructions or conversations aloud  To address problems with fluctuating attention and/or executive dysfunction, she may wish to consider:   -Avoiding external distractions when needing to concentrate   -Limiting exposure to fast paced environments with multiple sensory demands   -Writing down complicated information and using checklists   -Attempting and completing one task at a time (i.e., no multi-tasking)   -Verbalizing aloud each step of a task to maintain focus   -Taking frequent breaks during the completion of steps/tasks to avoid fatigue   -Reducing the amount of information considered at one time   -Scheduling more difficult activities for a time of day where she is usually most alert  Review of Records:   Candace Hill was seen by [facility] Neurology Aaron AasRayfield Cairo, M.D.) on 12/20/2019 for an evaluation of memory loss. Briefly, she saw her PCP in January 2021 reporting that a friend of hers who used to be a nurse was concerned about her memory. She was told she should be getting more things done and is having  trouble with this. At that time, she was in the process of moving and was having a hard time. She has also noticed memory changes herself, stating that she cannot remember things in a row. 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 She has 7% vision on the left. She recalled a fall in February 2020 when she was in the bank. She broke her nose and two front teeth, saying "someone caused me to have an accident." Since then, she reported falling more and feeling dizzy with a spinning sensation. Additionally, she reported an incident during the previous week where while on the phone, she could not understand anything she was trying to say, commenting that gibberish was coming out. This lasted a few hours and possibly represented a TIA. She denied any focal weakness. She currently lives alone in senior independent living at [place]. She denies getting lost driving, missing medications, or trouble performing other ADLs. Performance on a brief cognitive screening instrument (MMSE) was 28/30 in July 2020. Performance on the SLUMS was 25/30 during the current appointment with Dr. Ty Gales. Ultimately, Candace Hill was referred for a comprehensive neuropsychological evaluation to characterize her cognitive abilities and to assist with diagnostic clarity and treatment planning.    She completed a comprehensive neuropsychological evaluation with myself on 02/08/2020. Results suggested deficits primarily surrounding phonemic fluency and encoding/retrieval aspects of memory. Performance variability was further noted across processing speed, executive functioning, and retention/consolidation aspects of memory. Performance was appropriate across attention/concentration, receptive language, semantic  fluency, confrontation naming, and visuospatial abilities. Given intact ADLs, she was diagnosed with a mild neurocognitive disorder. At that time, a primary vascular etiology appeared most likely given numerous cardiovascular medical ailments, microvascular ischemic disease, and prior stroke history. Repeat testing was recommended.    She was seen by [facility] Neurology Tex Filbert, PA-C) on 07/29/2021 for follow-up. Concern for progressive cognitive decline was expressed. Her daughter expressed concerns surrounding medication adherence. For example, Candace Hill has reportedly been taking Prograf once per day (half  dose), which places her at risk for infections and other complications. Her daughter also monitors finances. Her level of activity has decreased and she seems more disoriented. She acknowledges ongoing depression. Performance on a brief cognitive screening instrument (MOCA) was 22/30. Ultimately, Candace Hill was referred for a repeat neuropsychological evaluation to characterize her cognitive abilities and to assist with diagnostic clarity and treatment planning.   She completed a repeat evaluation with myself on 03/18/2022. Relative to her previous evaluation in August 2021, performance declines were exhibited across several domains. This was most prominent across executive functioning, receptive language, and semantic fluency. Declines were also evidence across aspects of memory, particularly delayed retrieval aspects of verbal memory tasks. Other assessed domains generally exhibited stability. The etiology of ongoing dysfunction was unclear as patterns across testing continue to be somewhat nonspecific. While a primary vascular contribution given medical ailments, microvascular ischemic disease, and prior lacunar infarct was previously theorized, generally speaking, this would not yield progressively worsening cognitive abilities with no reported vascular or other medical event in the interim. The  presence of an underlying neurodegenerative condition remained a possibility. Of these illnesses, Alzheimer's disease would be most likely. Across verbal memory tasks, she did not benefit from repeated exposure to information, exhibited prominent difficulties recalling previously learned information after a brief delay, and performed very poorly across yes/no recognition trials. This could suggest rapid forgetting and an evolving storage impairment, which are hallmark signs of this illness. Impairment and progressive decline in semantic fluency would represent typical disease progression. However, visual memory scores remained generally adequate and performance across confrontation naming was also adequate. She did report moderate to severe anxiety, depression, and sleep disturbances across questionnaires and during interview. While these factors can certainly worsen cognitive functioning, they would not account for progressive cognitive decline as these symptoms were generally reported at similar severity levels during the previous evaluation.   In the interim, she has had ED visits due to a fall (04/23/2022), concern surrounding dark stools (05/06/2022), hypertension (09/11/2022), hypotension (08/26/2023), and near syncope (09/10/2023).   Ultimately, Candace Hill was referred for a repeat neuropsychological evaluation to characterize her cognitive abilities and to assist with diagnostic clarity and treatment planning.   Neuroimaging: Head CT on 12/27/2012 revealed chronic white matter changes but no evidence for infarct. Brain MRI on 01/16/2015 revealed mild periventricular and subcortical chronic small vessel ischemic changes. There was also a small focal cystic lesion in the right genu of the corpus callosum, possibly representing a very small chronic ischemic infarct. Head CT on 07/20/2018 was said to be normal and did not display evidence for prior strokes. Brain MRI on 01/21/2020 revealed generalized atrophy with  microvascular ischemic changes (progression since 2016). No evidence of prior strokes was reported. Brain MRI on 04/15/2021 was stable without any acute intracranial abnormalities. Head CT on 07/23/2021 was negative. Head CT on 04/23/2022 was also negative.   Past Medical History:  Diagnosis Date   Abdominal pain    Acute cystitis without hematuria 03/12/2022   Allergic rhinitis 11/14/2012   Amblyopia of eye, left 06/17/2017   Asbestos exposure 04/08/2018   Asthma    1994-1994 due to black mold house, no asthma now   Atherosclerosis of aorta 01/28/2023   Broken ankle    right   Broken rib    x2   Cataract    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 03/26/2011   Chronic hepatitis C virus infection 05/31/2013   Complication of anesthesia  IV phenergan  serious mental reaction   Delusional thoughts 03/18/2022   Diarrhea 03/13/2022   Dysuria 07/29/2023   Essential hypertension 10/14/2017   Gastroesophageal reflux disease 10/14/2017   Generalized anxiety disorder    Hernia, femoral    left   History of blood transfusion 1971   acquired hepatitis c   History of kidney stones    History of liver transplant 11/14/2012   History of nephrolithiasis 10/14/2017   History of shingles    Hyperglycemia 04/08/2018   Hyperlipidemia 01/15/2021   The 10-year ASCVD risk score (Arnett DK, et al., 2019) is: 16.2%    Values used to calculate the score:      Age: 77 years      Sex: Female      Is Non-Hispanic African American: No      Diabetic: No      Tobacco smoker: No      Systolic Blood Pressure: 136 mmHg      Is BP treated: No      HDL Cholesterol: 51.2 mg/dL      Total Cholesterol: 202 mg/dL     Intrinsic eczema 81/19/1478   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   Major depressive disorder 11/14/2012   Malignant neoplasm of liver 11/14/2012   Migraine 07/02/2015   Mild neurocognitive disorder due to multiple etiologies 03/18/2022   Overweight (BMI  25.0-29.9) 06/28/2015   PCO (posterior capsular opacification), right 03/26/2011   Seasonal affective disorder    Splenic artery aneurysm    Stage 4 chronic kidney disease 07/29/2023   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-will get Avastin injections off/on   Vitamin D deficiency 04/08/2018   Weight loss, non-intentional 03/12/2022    Past Surgical History:  Procedure Laterality Date   CATARACT EXTRACTION Bilateral    CHOLECYSTECTOMY  1997   CYSTOSCOPY W/ URETERAL STENT PLACEMENT Left 08/14/2016   Procedure: CYSTOSCOPY WITH RETROGRADE PYELOGRAM/URETERAL LEFT URETEROSCOPY AND LEFT STENT PLACEMENT;  Surgeon: Florencio Hunting, MD;  Location: [facility];  Service: Urology;  Laterality: Left;   CYSTOSCOPY/URETEROSCOPY/HOLMIUM LASER/STENT PLACEMENT Left 07/06/2016   Procedure: CYSTOSCOPY/URETEROSCOPY/ BILATERAL RETROGRADE/HOLMIUM LASER/STENT PLACEMENT/BASKET STONE REMOVAL;  Surgeon: Florencio Hunting, MD;  Location: [facility];  Service: Urology;  Laterality: Left;   CYSTOSCOPY/URETEROSCOPY/HOLMIUM LASER/STENT PLACEMENT Left 08/06/2016   Procedure: CYSTOSCOPY/URETEROSCOPY/HOLMIUM LASER/STENT PLACEMENT;  Surgeon: Florencio Hunting, MD;  Location: [facility];  Service: Urology;  Laterality: Left;   CYSTOSCOPY/URETEROSCOPY/HOLMIUM LASER/STENT PLACEMENT Left 08/31/2016   Procedure: CYSTOSCOPY/URETEROSCOPY/HOLMIUM LASER/STENT PLACEMENT/BASKET STONE REMOVAL;  Surgeon: Florencio Hunting, MD;  Location: [facility];  Service: Urology;  Laterality: Left;   FEMORAL HERNIA REPAIR Left 1997   left leg-   LIVER TRANSPLANTATION     NASAL SINUS SURGERY  1997   x3   TONSILLECTOMY  age 87   TUBAL LIGATION      Current Outpatient Medications:    aspirin EC 81 MG tablet, Take 81 mg by mouth daily., Disp: , Rfl:    atorvastatin (LIPITOR) 10 MG tablet, TAKE 1 TABLET EVERY DAY, Disp: 90 tablet, Rfl: 1   CALCIUM-VITAMIN D PO, Take 600 mg by mouth daily.,  Disp: , Rfl:    Ferrous Sulfate (IRON) 325 (65 Fe) MG TABS, Take 1 tablet by mouth daily., Disp: , Rfl:    folic acid (FOLVITE) 400 MCG tablet, Take 800 mcg by mouth daily., Disp: , Rfl:  lamoTRIgine (LAMICTAL) 25 MG tablet, Take 1 tablet (25 mg total) by mouth daily., Disp: 25 tablet, Rfl: 1   memantine (NAMENDA XR) 21 MG CP24 24 hr capsule, TAKE 1 CAPSULE(21 MG) BY MOUTH DAILY, Disp: 90 capsule, Rfl: 0   olmesartan (BENICAR) 5 MG tablet, Take 2 tablets (10 mg total) by mouth daily., Disp: 180 tablet, Rfl: 0   sertraline (ZOLOFT) 100 MG tablet, Take 1 tablet (100 mg total) by mouth daily. Take with 50mg  tablet daily, Disp: 30 tablet, Rfl: 2   sertraline (ZOLOFT) 50 MG tablet, Take 1 tablet (50 mg total) by mouth daily. Take with 100mg  tablet daily., Disp: 30 tablet, Rfl: 2   tacrolimus (PROGRAF) 0.5 MG capsule, 6 by mouth daily NAME BRAND ONLY, Disp: , Rfl:   Clinical Interview:   The following information was obtained during a clinical interview with Candace Hill. Ayub and her daughter prior to cognitive testing.  Cognitive Symptoms: Decreased short-term memory: Endorsed. She previously reported primary difficulties recalling the details of previous conversations and misplacing things around her home. Her daughter previously noted that Candace Hill. Kiernan frequently repeated herself in conversation. Similar examples were provided currently. She also noted greater trouble recalling names and word finding in general. Both she and her daughter expressed their perception of progressive cognitive decline relative to her most recent October 2023 evaluation. Currently, she reported continued progressive decline surrounding cognitive abilities. Decreased long-term memory: Denied. Decreased attention/concentration: Endorsed. She previously reported longstanding difficulties with sustained attention and ease of distractibility dating back to early childhood and academic settings. While she was never assessed for or  diagnosed with ADHD, she reported feeling as though she would meet diagnostic criteria if she were a young child today. These symptoms have been stable throughout her life and appear worsened by various stressors.  Reduced processing speed: Endorsed. She previously reported her mind feeling "foggy" and that it takes her added time to understand certain things at times. This was stable. Difficulties with executive functions: Endorsed. She previously described longstanding difficulties with organization and indecisiveness. These difficulties have persisted, along with trouble surrounding broad problem solving. She denied trouble with impulsivity or overt personality changes. This was stable. Difficulties with emotion regulation: Denied. Difficulties with receptive language: Denied. Difficulties with word finding: Endorsed. As stated above, she reported increased word finding difficulties within the past year or so. Her daughter noted Mr. Trippett engaging in long pauses before speaking, presumably due to her searching for the right word to use.  Decreased visuoperceptual ability: Denied.   Difficulties completing ADLs: Endorsed. In the past, she had voluntarily moved into a senior independent living facility for increased safety and opportunity for medical attention. However, in between her 2021 and 2023 evaluations, she reported issues with this living arrangement and moved into an apartment complex not associated with any senior living agency. She has continued to maintain this living arrangement, noting that her daughter lives nearby to provide support. Since her previous evaluation, Candace Hill. Dirks stopped driving. This was attributed to visual decline. Her daughter is in charge of medication management and does provide reminders for Candace Hill. [place] to better ensure medication adherence. Financially, she receives some assistance and has placed most of her bills on auto-draft.   Additional Medical History: History  of traumatic brain injury/concussion: Unclear. A mentioned above, she fell while in a bank in 2020, causing her to break her nose and her two front teeth. A loss in consciousness was denied as were significant symptoms of retrograde or posttraumatic amnesia. No  other head injury concerns were noted. History of stroke: Candace Hill. Ulloa previously described experiencing three "mini-strokes" over the past 8-10 years and that these strokes were previously identified via neuroimaging. Her description more mirrored what would be expected with transient ischemic attacks (TIAs) in that stroke-like symptoms were very brief, never lasting for more than 24 hours. 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

## 2023-09-24 NOTE — Progress Notes (Signed)
   Psychometrician Note   Cognitive testing was administered to Candace Hill by Wallace Keller, B.S. (psychometrist) under the supervision of Dr. Newman Nickels, Ph.D., ABPP, licensed psychologist on 09/24/2023. Candace Hill did not appear overtly distressed by the testing session per behavioral observation or responses across self-report questionnaires. Rest breaks were offered.    The battery of tests administered was selected by Dr. Newman Nickels, Ph.D., ABPP with consideration to Candace Hill's current level of functioning, the nature of her symptoms, emotional and behavioral responses during interview, level of literacy, observed level of motivation/effort, and the nature of the referral question. This battery was communicated to the psychometrist. Communication between Dr. Newman Nickels, Ph.D., ABPP and the psychometrist was ongoing throughout the evaluation and Dr. Newman Nickels, Ph.D., ABPP was immediately accessible at all times. Dr. Newman Nickels, Ph.D., ABPP provided supervision to the psychometrist on the date of this service to the extent necessary to assure the quality of all services provided.    Candace Hill will return within approximately 1-2 weeks for an interactive feedback session with Dr. Milbert Coulter at which time her test performances, clinical impressions, and treatment recommendations will be reviewed in detail. Candace Hill understands she can contact our office should she require our assistance before this time.  A total of 125 minutes of billable time were spent face-to-face with Candace Hill by the psychometrist. This includes both test administration and scoring time. Billing for these services is reflected in the clinical report generated by Dr. Newman Nickels, Ph.D., ABPP  This note reflects time spent with the psychometrician and does not include test scores or any clinical interpretations made by Dr. Milbert Coulter. The full report will follow in a separate note.

## 2023-09-28 ENCOUNTER — Encounter: Payer: Self-pay | Admitting: Psychology

## 2023-09-28 NOTE — Unmapped (Signed)
 Lvm for pt re scheduling annual appt in Sept. Asked for call back and left call-back number. 1st call.

## 2023-09-30 NOTE — Unmapped (Signed)
 Received call from pt's daughter April. Pt told her this tpa had left her a message and asked April to call back. Told her trying to schedule annual appt which she would like to be in person. Pt needs afternoon appt and provider's template is not yet ready for September Asked her if ok if this tpa calls her back when it's up, and she verbalized understanding.    April said pt has been diagnosed with vascular dementia and will received cognitive test results on 4/22. Pt also has upcoming appt with kidney specialist in [place]. Daughter said having specialist locally better for pt.

## 2023-10-05 ENCOUNTER — Ambulatory Visit: Admitting: Psychology

## 2023-10-05 DIAGNOSIS — I6381 Other cerebral infarction due to occlusion or stenosis of small artery: Secondary | ICD-10-CM | POA: Diagnosis not present

## 2023-10-05 DIAGNOSIS — F331 Major depressive disorder, recurrent, moderate: Secondary | ICD-10-CM | POA: Diagnosis not present

## 2023-10-05 DIAGNOSIS — F0280 Dementia in other diseases classified elsewhere without behavioral disturbance: Secondary | ICD-10-CM

## 2023-10-05 DIAGNOSIS — F411 Generalized anxiety disorder: Secondary | ICD-10-CM

## 2023-10-05 DIAGNOSIS — F0150 Vascular dementia without behavioral disturbance: Secondary | ICD-10-CM

## 2023-10-05 DIAGNOSIS — G309 Alzheimer's disease, unspecified: Secondary | ICD-10-CM | POA: Diagnosis not present

## 2023-10-05 NOTE — Progress Notes (Signed)
   Neuropsychology Feedback Session Tommas Fragmin. [facility] [place] Department of Neurology  Reason for Referral:   Candace Hill is a 78 y.o. right-handed Caucasian female referred by Tex Filbert, PA-C, to characterize her current cognitive functioning and assist with diagnostic clarity and treatment planning in the context of a prior history of a vascular neurocognitive disorder, psychiatric comorbidities, and concerns for ongoing progressive decline.   Feedback:   Candace Hill completed a comprehensive neuropsychological evaluation on 09/24/2023. Please refer to that encounter for the full report and recommendations. Briefly, results suggested prominent impairments surrounding processing speed, verbal fluency, and both encoding (i.e., learning) and delayed retrieval aspects of memory. Further performance variability was exhibited across executive functioning, visuospatial abilities, and recognition/consolidation aspects of memory. Relative to her previous neuropsychological evaluation in October 2023, mild decline was exhibited across processing speed, cognitive flexibility, and delayed retrieval/recognition aspects involving visual memory. Some intra-domain variability was exhibited across basic attention where one task saw decline and another exhibited stability. An improvement was exhibited across a task assessing receptive language (this performance arose back to 2021 levels). Other assessed cognitive domains exhibited relative stability. Across mood-related questionnaires, Candace Hill did report notably decreased levels of acute anxiety and depression. The etiology for Candace Hill's mild dementia presentation is likely multifactorial in nature (i.e., a mixed dementia). There is a good likelihood of a notable vascular contribution given neuroimaging suggesting progressive microvascular ischemic disease and a prior small stroke involving the corpus callosum. Medically, Candace Hill has a large  degree of chronic medical ailments which also can reasonably impact cognitive functioning (e.g., hypertension, hyperlipidemia, aortic atherosclerosis, stage 4 kidney disease, and vitamin deficiencies). She further noted ongoing sleep dysfunction during interview, including a "flipped" sleep schedule. While questionnaires suggest improved psychiatric symptoms relative to October 2023, she did report ongoing anxiety and depression during the current interview. This combination of chronic medical ailments, psychiatric distress, sleep dysfunction, and cerebrovascular disease can certainly create ongoing cognitive impairment. Her greatest areas of impairment surrounding processing speed, fluency, and the learning aspects of memory align with expectations of this combination well. Going back to her 2021 evaluation, there has been objective evidence for decline surrounding semantic fluency and both delayed retrieval and recognition/consolidation aspects of memory. Across current memory testing. Candace Hill was fully amnestic (i.e., 0% retention) across both list and figure-based memory tasks after a brief delay and performed quite poorly across recognition trials. Amnestic memory and evidence for decline over time would be unexpected for a pure vascular/medical dementia presentation and does align with expectations surrounding Alzheimer's disease. Decline surrounding semantic fluency also aligns with Alzheimer's disease. Appropriate story retention and confrontation naming performances continues to be encouraging. If there is an underlying Alzheimer's disease component to her current presentation, this aspect appears to be progressing slowly and would still be in earlier stages. Continued medical monitoring will be important moving forward.   Candace Hill was accompanied by her daughter during the current feedback session. Content of the current session focused on the results of her neuropsychological evaluation. Candace Hill  was given the opportunity to ask questions and her questions were answered. She was encouraged to reach out should additional questions arise. A copy of her report was provided at the conclusion of the visit.      One unit [phone] was billed for Dr. Arsenio Larger time spent preparing for, conducting, and documenting the current feedback session with Candace Hill.

## 2023-10-06 ENCOUNTER — Encounter: Payer: Self-pay | Admitting: Internal Medicine

## 2023-10-07 ENCOUNTER — Other Ambulatory Visit: Payer: Self-pay | Admitting: Internal Medicine

## 2023-10-07 DIAGNOSIS — I6381 Other cerebral infarction due to occlusion or stenosis of small artery: Secondary | ICD-10-CM

## 2023-10-07 DIAGNOSIS — R270 Ataxia, unspecified: Secondary | ICD-10-CM | POA: Insufficient documentation

## 2023-10-12 NOTE — Unmapped (Signed)
 [facility] Clinical Assessment & Refill Coordination Note    Kristy Hanson, DOB: 1945/07/12  Phone: [phone] (home)     All above HIPAA information was verified with patient's family member, Kristy Hanson.     Was a Nurse, learning disability used for this call? No    Specialty Medication(s):   Transplant: tacrolimus 0.5mg      Current Outpatient Medications   Medication Sig Dispense Refill    amlodipine (NORVASC) 2.5 MG tablet Take 1 tablet (2.5 mg total) by mouth daily.      aspirin (ECOTRIN) 81 MG tablet Take 1 tablet (81 mg total) by mouth daily.      atorvastatin (LIPITOR) 10 MG tablet Take 1 tablet (10 mg total) by mouth daily.      biotin 1,000 mcg Chew Chew 1,000 mcg  in the morning.      ferrous sulfate 325 (65 FE) MG tablet Take 1 tablet (325 mg total) by mouth in the morning.      memantine (NAMENDA) 5 MG tablet Take 1 tablet (5 mg total) by mouth two (2) times a day.      psyllium (METAMUCIL) 3.4 gram packet Take 1 packet by mouth daily as needed (constipation).      sertraline (ZOLOFT) 100 MG tablet Take 1 tablet (100 mg total) by mouth daily.      tacrolimus (PROGRAF) 0.5 MG capsule Take 3 capsules (1.5 mg total) by mouth two (2) times a day. 540 capsule 4     No current facility-administered medications for this visit.        Changes to medications: [name] no changes at this time.    Medication list has been reviewed and updated in Epic: Yes    Allergies   Allergen Reactions    Avastin [Bevacizumab] Other (See Comments)     Mini stroke    Prednisone Other (See Comments)     Patient was told by her ophthalmologist that the vision loss she had in one of her eyes was likely a result of high dose steroids. She also reports insomnia/jitters when taking it.    Amoxicillin-Pot Clavulanate      Other reaction(s): NAUSEA    Codeine      Other reaction(s): NAUSEA    Penicillins      Other reaction(s): UNKNOWN  * Pt would like to know why she shouldn't take this*    Procaine      Other reaction(s): dizziness    Promethazine      oversedation    Sulfa (Sulfonamide Antibiotics)      Other reaction(s): ITCHING    Lidocaine Palpitations    Tamsulosin Itching       Changes to allergies: No    Allergies have been reviewed and updated in Epic: Yes    SPECIALTY MEDICATION ADHERENCE     Tacrolimus 0.5mg   : 5 days of medicine on hand        Medication Adherence    Patient reported X missed doses in the last month: 0  Specialty Medication: tacrolimus 0.5mg   Patient is on additional specialty medications: No          Specialty medication(s) dose(s) confirmed: Regimen is correct and unchanged.     Are there any concerns with adherence? No    Adherence counseling provided? Yes, see clinical intervention documentation    CLINICAL MANAGEMENT AND INTERVENTION      Clinical Benefit Assessment:    Do you feel the medicine is effective or helping  your condition? Yes    Clinical Benefit counseling provided? Not needed    Adverse Effects Assessment:    Are you experiencing any side effects? No    Are you experiencing difficulty administering your medicine? No    Quality of Life Assessment:    Quality of Life    Rheumatology  Oncology  Dermatology  Cystic Fibrosis          How many days over the past month did your transplant  keep you from your normal activities? For example, brushing your teeth or getting up in the morning. 0    Have you discussed this with your provider? Not needed    Acute Infection Status:    Acute infections noted within Epic:  Rule Out C. Diff    Patient reported infection: None    Therapy Appropriateness:    Is therapy appropriate based on current medication list, adverse reactions, adherence, clinical benefit and progress toward achieving therapeutic goals? Yes, therapy is appropriate and should be continued     Clinical Intervention:    Was an intervention completed as part of this clinical assessment?   [facility] - Clinical Pharmacist Intervention   Reason for Intervention Issue Observed Medication adherence          Additional Comments Kristy Hanson states patient is late on doses sometimes. use pillbox and alarm already. they have appt to discuss with clinic, i will message clinic as well   Intervention Type of Intervention Education performed  Provider contacted                  Recommendation provided, if applicable message to clinic to followup with Kristy Hanson per her request    Medication Medication(s) Involved tacrolimus   Outcome Expected Result/Outcome of Intervention Improved patient safety  Improved medication adherence  Improved therapy effectiveness    Additional Comments      Follow-up Needed? No    Additional Follow-up Comments     Supporting Information Approximate Time Spent on Intervention 0-10 minutes    Clinical Evidence Used Chart review or clinical coordination  Clinical guidelines or Drug information resource  Professional judgement        DISEASE/MEDICATION-SPECIFIC INFORMATION      N/A    Solid Organ Transplant: Not Applicable    PATIENT SPECIFIC NEEDS     Does the patient have any physical, cognitive, or cultural barriers? No    Is the patient high risk? No    Does the patient require physician intervention or other additional services (i.e., nutrition, smoking cessation, social work)? No    Does the patient have an additional or emergency contact listed in their chart? Yes    SOCIAL DETERMINANTS OF HEALTH     At the [facility], we have learned that life circumstances - like trouble affording food, housing, utilities, or transportation can affect the health of many of our patients.   That is why we wanted to ask: are you currently experiencing any life circumstances that are negatively impacting your health and/or quality of life? Patient declined to answer    Social Drivers of Health     Food Insecurity: No Food Insecurity (04/30/2023)    Received from [facility]    Hunger Vital Sign     Worried About Running Out of Food in the Last Year: Never true     Ran Out of Food in the Last Year: Never true   Tobacco Use: Low Risk  (09/14/2023)  Received from [facility]    Patient History     Smoking Tobacco Use: Never     Smokeless Tobacco Use: Never     Passive Exposure: Not on file   Transportation Needs: No Transportation Needs (04/30/2023)    Received from [facility] - Transportation     Lack of Transportation (Medical): No     Lack of Transportation (Non-Medical): No   Alcohol Use: Not on file   Housing: Not on file   Physical Activity: Insufficiently Active (12/24/2022)    Received from [facility]    Exercise Vital Sign     Days of Exercise per Week: 7 days     Minutes of Exercise per Session: 20 min   Utilities: Not At Risk (04/30/2023)    Received from [facility]     Threatened with loss of utilities: No   Stress: No Stress Concern Present (12/24/2022)    Received from [facility] - Occupational Stress Questionnaire     Feeling of Stress : Only a little   Interpersonal Safety: Not on file   Substance Use: Not on file (04/24/2023)   Intimate Partner Violence: Not At Risk (12/24/2022)    Received from [facility]    Humiliation, Afraid, Rape, and Kick questionnaire     Fear of Current or Ex-Partner: No     Emotionally Abused: No     Physically Abused: No     Sexually Abused: No   Social Connections: Moderately Isolated (12/24/2022)    Received from [facility]    Social Connection and Isolation Panel [NHANES]     Frequency of Communication with Friends and Family: More than three times a week     Frequency of Social Gatherings with Friends and Family: Twice a week     Attends Religious Services: Never     Database administrator or Organizations: Yes     Attends Banker Meetings: Never     Marital Status: Divorced   Programmer, applications: Low Risk  (12/24/2022)    Received from [facility]    Overall Financial Resource Strain (CARDIA)     Difficulty of Paying Living Expenses: Not hard at all   Health Literacy: Adequate Health Literacy (12/24/2022)    Received from [facility]    B1300 Health Literacy     Frequency of need for help with medical instructions: Never   Internet Connectivity: Not on file       Would you be willing to receive help with any of the needs that you have identified today? Not applicable       SHIPPING     Specialty Medication(s) to be Shipped:   Transplant: tacrolimus 0.5mg     Other medication(s) to be shipped: No additional medications requested for fill at this time     Changes to insurance: No    Cost and Payment: Patient has a copay of $47. They are aware and have authorized the pharmacy to charge the credit card on file.    Delivery Scheduled: Yes, Expected medication delivery date: 10/14/2023.     Medication will be delivered via UPS to the confirmed prescription address in [facility]    The patient will receive a drug information handout for each medication shipped and additional FDA Medication Guides as required.  Verified that patient has previously received a Conservation officer, historic buildings and a Surveyor, mining.  The patient or caregiver noted above participated in the development of this care plan and knows that they can request review of or adjustments to the care plan at any time.      All of the patient's questions and concerns have been addressed.    Christine Cozier, PharmD   [facility] Specialty Pharmacist

## 2023-10-13 MED FILL — TACROLIMUS 0.5 MG CAPSULE, IMMEDIATE-RELEASE: ORAL | 30 days supply | Qty: 180 | Fill #10

## 2023-10-14 ENCOUNTER — Ambulatory Visit: Attending: Internal Medicine | Admitting: Physical Therapy

## 2023-10-14 ENCOUNTER — Encounter: Payer: Self-pay | Admitting: Physical Therapy

## 2023-10-14 ENCOUNTER — Other Ambulatory Visit: Payer: Self-pay

## 2023-10-14 DIAGNOSIS — M6281 Muscle weakness (generalized): Secondary | ICD-10-CM | POA: Insufficient documentation

## 2023-10-14 DIAGNOSIS — R270 Ataxia, unspecified: Secondary | ICD-10-CM | POA: Insufficient documentation

## 2023-10-14 DIAGNOSIS — R296 Repeated falls: Secondary | ICD-10-CM | POA: Diagnosis not present

## 2023-10-14 DIAGNOSIS — R2681 Unsteadiness on feet: Secondary | ICD-10-CM | POA: Diagnosis not present

## 2023-10-14 DIAGNOSIS — I6381 Other cerebral infarction due to occlusion or stenosis of small artery: Secondary | ICD-10-CM | POA: Diagnosis not present

## 2023-10-14 DIAGNOSIS — R2689 Other abnormalities of gait and mobility: Secondary | ICD-10-CM | POA: Diagnosis not present

## 2023-10-14 NOTE — Therapy (Signed)
 OUTPATIENT PHYSICAL THERAPY EVALUATION   Patient Name: Obie Scragg MRN: [id number] DOB:August 20, 1945, 78 y.o., female Today's Date: 10/14/2023  END OF SESSION:  PT End of Session - 10/14/23 1442     Visit Number 1    Number of Visits 17    Authorization Type [employer] MCR    Authorization Time Period 10/14/23 to 12/23/23    Progress Note Due on Visit 10    PT Start Time 1439   pt late to eval   PT Stop Time 1515    PT Time Calculation (min) 36 min    Activity Tolerance Patient tolerated treatment well    Behavior During Therapy [facility] for tasks assessed/performed             Past Medical History:  Diagnosis Date   Abdominal pain    Acute cystitis without hematuria 03/12/2022   Allergic rhinitis 11/14/2012   Amblyopia of eye, left 06/17/2017   Asbestos exposure 04/08/2018   Asthma    [id number] due to black mold house, no asthma now   Atherosclerosis of aorta 01/28/2023   Broken ankle    right   Broken rib    x2   Cataract    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 03/26/2011   Chronic hepatitis C virus infection 05/31/2013   Complication of anesthesia    IV phenergan   serious mental reaction   Delusional thoughts 03/18/2022   Diarrhea 03/13/2022   Dysuria 07/29/2023   Essential hypertension 10/14/2017   Gastroesophageal reflux disease 10/14/2017   Generalized anxiety disorder    Hernia, femoral    left   History of blood transfusion 1971   acquired hepatitis c   History of kidney stones    History of liver transplant 11/14/2012   History of nephrolithiasis 10/14/2017   History of shingles    Hyperglycemia 04/08/2018   Hyperlipidemia 01/15/2021   The 10-year ASCVD risk score (Arnett DK, et al., 2019) is: 16.2%    Values used to calculate the score:      Age: 79 years      Sex: Female      Is Non-Hispanic African American: No      Diabetic: No      Tobacco smoker: No      Systolic Blood Pressure: 136 mmHg      Is BP treated: No      HDL  Cholesterol: 51.2 mg/dL      Total Cholesterol: 202 mg/dL     Intrinsic eczema 29/52/8413   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   Major depressive disorder 11/14/2012   Malignant neoplasm of liver 11/14/2012   Migraine 07/02/2015   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   vascular/medical; concerns for concurrent Alzheimer's disease   Overweight (BMI 25.0-29.9) 06/28/2015   PCO (posterior capsular opacification), right 03/26/2011   Seasonal affective disorder    Splenic artery aneurysm    Stage 4 chronic kidney disease 07/29/2023   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-will get Avastin  injections off/on   Vitamin D  deficiency 04/08/2018   Weight loss, non-intentional 03/12/2022   Past Surgical History:  Procedure Laterality Date   CATARACT EXTRACTION Bilateral    CHOLECYSTECTOMY  1997   CYSTOSCOPY W/ URETERAL STENT PLACEMENT Left 08/14/2016   Procedure: CYSTOSCOPY WITH RETROGRADE PYELOGRAM/URETERAL LEFT URETEROSCOPY AND LEFT STENT PLACEMENT;  Surgeon: Julietta Ogren  Rozanne Corners, MD;  Location: [facility];  Service: Urology;  Laterality: Left;   CYSTOSCOPY/URETEROSCOPY/HOLMIUM LASER/STENT PLACEMENT Left 07/06/2016   Procedure: CYSTOSCOPY/URETEROSCOPY/ BILATERAL RETROGRADE/HOLMIUM LASER/STENT PLACEMENT/BASKET STONE REMOVAL;  Surgeon: Florencio Hunting, MD;  Location: [facility];  Service: Urology;  Laterality: Left;   CYSTOSCOPY/URETEROSCOPY/HOLMIUM LASER/STENT PLACEMENT Left 08/06/2016   Procedure: CYSTOSCOPY/URETEROSCOPY/HOLMIUM LASER/STENT PLACEMENT;  Surgeon: Florencio Hunting, MD;  Location: [facility];  Service: Urology;  Laterality: Left;   CYSTOSCOPY/URETEROSCOPY/HOLMIUM LASER/STENT PLACEMENT Left 08/31/2016   Procedure: CYSTOSCOPY/URETEROSCOPY/HOLMIUM LASER/STENT PLACEMENT/BASKET STONE REMOVAL;  Surgeon: Florencio Hunting, MD;  Location: [facility];  Service: Urology;  Laterality: Left;   FEMORAL HERNIA REPAIR Left 1997    left leg-   LIVER TRANSPLANTATION     NASAL SINUS SURGERY  1997   x3   TONSILLECTOMY  age 8   TUBAL LIGATION     Patient Active Problem List   Diagnosis Date Noted   Ataxia 10/07/2023   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   Stage 4 chronic kidney disease 07/29/2023   Need for varicella vaccine 01/28/2023   Atherosclerosis of aorta 01/28/2023   Splenic artery aneurysm 03/18/2022   Lacunar infarction 03/18/2022   Generalized anxiety disorder    Intrinsic eczema 01/15/2021   Hyperlipidemia 01/15/2021   Vitamin D  deficiency 04/08/2018   Essential hypertension 10/14/2017   Gastroesophageal reflux disease 10/14/2017   Overweight (BMI 25.0-29.9) 06/28/2015   Chronic hepatitis C virus infection 05/31/2013   Major depressive disorder 11/14/2012   Allergic rhinitis 11/14/2012   History of liver transplant 11/14/2012   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 03/26/2011   PCO (posterior capsular opacification), right 03/26/2011    PCP: Arcadio Knuckles, MD  REFERRING PROVIDER: Arcadio Knuckles, MD  REFERRING DIAG:  Diagnosis  I63.81 (ICD-10-CM) - Lacunar infarction (HCC)  R27.0 (ICD-10-CM) - Ataxia    THERAPY DIAG:  Unsteadiness on feet  Repeated falls  Muscle weakness (generalized)  Other abnormalities of gait and mobility  Rationale for Evaluation and Treatment: Rehabilitation  ONSET DATE: 10/07/23 (MD referral)  SUBJECTIVE:   SUBJECTIVE STATEMENT:  Balance is difficult for me. Not sure when I had the stroke, was in the ED 2x due to low blood pressures, it is up and down at home but I do check it regularly. The ED told me I needed a cane when I left.   PERTINENT HISTORY: See above  PAIN:  Are you having pain? No 0/10  PRECAUTIONS:  Fall and Other: no vision L eye, poor vision R eye   RED FLAGS: None   WEIGHT BEARING RESTRICTIONS: No  FALLS:  Has patient fallen in last 6 months? Yes. Number of falls unsure but has had recent falls   LIVING  ENVIRONMENT: Lives with: lives alone but family is very involved and assists her quite a bit  Lives in: House/apartment Stairs: No Has following equipment at home: Single point cane  OCCUPATION: retired, used to work in [employer]   PLOF: Independent, Independent with basic ADLs, Independent with gait, and Independent with transfers  PATIENT GOALS: learn how to use cane, get balance a little better   NEXT MD VISIT: 12/14/23 with referring  OBJECTIVE:  Note: Objective measures were completed at Evaluation unless otherwise noted.    PATIENT SURVEYS:  ABC scale 43.8%  COGNITION: Overall cognitive status: History of cognitive impairments - at baseline        LOWER EXTREMITY MMT:  MMT Right eval Left eval  Hip flexion 4 4  Hip extension    Hip  abduction 4 seated 4 seated   Hip adduction    Hip internal rotation    Hip external rotation    Knee flexion 4+ 4+  Knee extension 4+ 4+  Ankle dorsiflexion    Ankle plantarflexion    Ankle inversion    Ankle eversion     (Blank rows = not tested)   FUNCTIONAL TESTS:   5xSTS UEs on thighs 19 seconds TUG 13.5 seconds no device 540ft minA                                                                                                                                 TREATMENT DATE:    10/14/23   BP 106/66 HR 78  Eval, care planning       PATIENT EDUCATION:  Education details: exam findings, care plan  Person educated: Patient Education method: Explanation Education comprehension: verbalized understanding and needs further education  HOME EXERCISE PROGRAM:   2nd session   ASSESSMENT:  CLINICAL IMPRESSION: Patient is a 78 y.o. F who was seen today for physical therapy evaluation and treatment for  Diagnosis  I63.81 (ICD-10-CM) - Lacunar infarction (HCC)  R27.0 (ICD-10-CM) - Ataxia  . Unfortunately she was a bit of a poor historian due to known memory problems/dementia- perseverated on being shocked that  medical supply store did not teach her how to use her new cane. See objectives as above. Will make every effort to improve function and reduce fall risk.   OBJECTIVE IMPAIRMENTS: Abnormal gait, decreased activity tolerance, decreased balance, decreased cognition, decreased knowledge of use of DME, decreased mobility, difficulty walking, decreased strength, and decreased safety awareness.   ACTIVITY LIMITATIONS: standing, squatting, stairs, transfers, and locomotion level  PARTICIPATION LIMITATIONS: meal prep, cleaning, laundry, shopping, community activity, occupation, and yard work  PERSONAL FACTORS: Age, Fitness, Past/current experiences, Social background, and Time since onset of injury/illness/exacerbation are also affecting patient's functional outcome.   REHAB POTENTIAL: Good  CLINICAL DECISION MAKING: Stable/uncomplicated  EVALUATION COMPLEXITY: Low   GOALS: Goals reviewed with patient? No  SHORT TERM GOALS: Target date: 11/11/2023   Will be compliant with appropriate progressive HEP with MinA from caregivers  Baseline: Goal status: INITIAL  2.  Will complete TUG test in 10 seconds without AD  Baseline:  Goal status: INITIAL  3.  Will demonstrate consistent correct sequencing with LRAD Baseline:  Goal status: INITIAL  4.  Will complete 5xSTS test in 13 seconds or less without UEs  Baseline:  Goal status: INITIAL   LONG TERM GOALS: Target date: 12/09/2023    MMT to be 5/5 in all tested groups  Baseline:  Goal status: INITIAL  2.  Will ambulate >672ft in with S, no unsteadiness  Baseline:  Goal status: INITIAL  3.  Will score at least 20 on DGI to show reduced fall risk  Baseline:  Goal status: INITIAL  4.  Will demonstrate ability to ambulate on outdoor/uneven surfaces with  and without LRAD to show reduced fall risk in community  Baseline:  Goal status: INITIAL  5.  ABC score to improve by 20% Baseline:  Goal status: INITIAL    PLAN:  PT  FREQUENCY: 1-2x/week  PT DURATION: 8 weeks  PLANNED INTERVENTIONS: 97750- Physical Performance Testing, 97110-Therapeutic exercises, 97530- Therapeutic activity, [id number]- Neuromuscular re-education, 97535- Self Care, [id number]- Manual therapy, and [id number]- Gait training  PLAN FOR NEXT SESSION: needs HEP. Keep it simple and functional for her. Strength, conditioning, balance. Need to determine if she is safer with or without cane given known cognitive impairment making it hard for her to get used to new device. Check BP PRN   Terrel Ferries, PT, DPT 10/14/23 3:30 PM

## 2023-10-15 ENCOUNTER — Ambulatory Visit (HOSPITAL_COMMUNITY): Admitting: Student in an Organized Health Care Education/Training Program

## 2023-10-15 DIAGNOSIS — F411 Generalized anxiety disorder: Secondary | ICD-10-CM | POA: Diagnosis not present

## 2023-10-15 DIAGNOSIS — F331 Major depressive disorder, recurrent, moderate: Secondary | ICD-10-CM

## 2023-10-15 MED ORDER — SERTRALINE HCL 50 MG PO TABS: 50.0000 mg | ORAL_TABLET | Freq: Every day | ORAL | 3 refills | Status: DC

## 2023-10-15 MED ORDER — LAMOTRIGINE 25 MG PO TABS: 25.0000 mg | ORAL_TABLET | Freq: Every day | ORAL | 3 refills | Status: DC

## 2023-10-15 MED ORDER — SERTRALINE HCL 100 MG PO TABS: 100.0000 mg | ORAL_TABLET | Freq: Every day | ORAL | 3 refills | Status: DC
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
## 2023-10-15 NOTE — Patient Instructions (Signed)
 Possible volunteer contact with [employer].  [email or web address]

## 2023-10-16 ENCOUNTER — Other Ambulatory Visit: Payer: Self-pay | Admitting: Internal Medicine

## 2023-10-16 DIAGNOSIS — F01B4 Vascular dementia, moderate, with anxiety: Secondary | ICD-10-CM

## 2023-10-18 DIAGNOSIS — Z944 Liver transplant status: Principal | ICD-10-CM

## 2023-10-18 DIAGNOSIS — E612 Magnesium deficiency: Principal | ICD-10-CM

## 2023-10-18 DIAGNOSIS — Z5181 Encounter for therapeutic drug level monitoring: Principal | ICD-10-CM

## 2023-10-18 NOTE — Unmapped (Signed)
 Called pt's daughter April as discussed on 4/17 (see note). Scheduled pt for 9/4 at 2:20. Daughter said pt will get labs at [facility] week before and asked that another order be entered. Told her this tpa will share that with primary coord. She started talking about pt's Stage 4 kidney disease, and this tpa suggested she call primary coord who could answer some questions she had pertaining to that and her liver tx. She said she would call her. Mentioned that primary coord retiring end of this month, and she was sad to hear that. She denied need for appt letter and verbalized understanding of all discussed.  Asked her to please tell pt this tpa wished her HBD yesterday.

## 2023-10-19 ENCOUNTER — Other Ambulatory Visit: Payer: Self-pay | Admitting: Internal Medicine

## 2023-10-19 DIAGNOSIS — F01B4 Vascular dementia, moderate, with anxiety: Secondary | ICD-10-CM

## 2023-10-21 DIAGNOSIS — E559 Vitamin D deficiency, unspecified: Secondary | ICD-10-CM | POA: Diagnosis not present

## 2023-10-21 DIAGNOSIS — C229 Malignant neoplasm of liver, not specified as primary or secondary: Secondary | ICD-10-CM | POA: Diagnosis not present

## 2023-10-21 DIAGNOSIS — Z944 Liver transplant status: Secondary | ICD-10-CM | POA: Diagnosis not present

## 2023-10-21 DIAGNOSIS — N1832 Chronic kidney disease, stage 3b: Secondary | ICD-10-CM | POA: Diagnosis not present

## 2023-10-21 DIAGNOSIS — D84821 Immunodeficiency due to drugs: Secondary | ICD-10-CM | POA: Diagnosis not present

## 2023-10-21 DIAGNOSIS — Z79899 Other long term (current) drug therapy: Secondary | ICD-10-CM | POA: Diagnosis not present

## 2023-10-21 DIAGNOSIS — N200 Calculus of kidney: Secondary | ICD-10-CM | POA: Diagnosis not present

## 2023-10-21 MED ORDER — MEMANTINE HCL ER 21 MG PO CP24
21.0000 mg | ORAL_CAPSULE | Freq: Every day | ORAL | 0 refills | Status: DC
Start: 2023-10-21 — End: 2024-02-02

## 2023-10-21 NOTE — Telephone Encounter (Signed)
 Copied from CRM [id number]. Topic: Clinical - Medication Refill >> Oct 21, 2023  3:42 PM [place] A wrote: Medication: memantine  (NAMENDA  XR) 21 MG CP24 24 hr capsule  Has the patient contacted their pharmacy? Yes (Agent: If no, request that the patient contact the pharmacy for the refill. If patient does not wish to contact the pharmacy document the reason why and proceed with request.) (Agent: If yes, when and what did the pharmacy advise?) They have not received refill request  This is the patient's preferred pharmacy:  [id number] - [place], [place] [address] AT [facility] [address] [place] [address] Phone: [phone] Fax: [phone]  Is this the correct pharmacy for this prescription? Yes If no, delete pharmacy and type the correct one.   Has the prescription been filled recently? No  Is the patient out of the medication? Yes  Has the patient been seen for an appointment in the last year OR does the patient have an upcoming appointment? Yes  Can we respond through MyChart? Yes  Agent: Please be advised that Rx refills may take up to 3 business days. We ask that you follow-up with your pharmacy.

## 2023-10-25 ENCOUNTER — Other Ambulatory Visit: Payer: Self-pay | Admitting: Nephrology

## 2023-10-25 DIAGNOSIS — D509 Iron deficiency anemia, unspecified: Secondary | ICD-10-CM | POA: Insufficient documentation

## 2023-10-25 DIAGNOSIS — N1832 Chronic kidney disease, stage 3b: Secondary | ICD-10-CM

## 2023-10-25 DIAGNOSIS — E612 Magnesium deficiency: Principal | ICD-10-CM

## 2023-10-25 DIAGNOSIS — Z944 Liver transplant status: Principal | ICD-10-CM

## 2023-10-25 DIAGNOSIS — Z5181 Encounter for therapeutic drug level monitoring: Principal | ICD-10-CM

## 2023-10-26 ENCOUNTER — Ambulatory Visit
Admission: RE | Admit: 2023-10-26 | Discharge: 2023-10-26 | Disposition: A | Discharge status: Home / Self Care | Source: Ambulatory Visit | Attending: Nephrology | Admitting: Nephrology

## 2023-10-26 ENCOUNTER — Ambulatory Visit: Attending: Cardiology | Admitting: Cardiology

## 2023-10-26 ENCOUNTER — Encounter: Payer: Self-pay | Admitting: Cardiology

## 2023-10-26 ENCOUNTER — Telehealth (HOSPITAL_BASED_OUTPATIENT_CLINIC_OR_DEPARTMENT_OTHER): Payer: Self-pay

## 2023-10-26 VITALS — BP 130/80 | HR 62 | Ht 63.0 in | Wt 132.0 lb

## 2023-10-26 DIAGNOSIS — I728 Aneurysm of other specified arteries: Secondary | ICD-10-CM | POA: Diagnosis not present

## 2023-10-26 DIAGNOSIS — G309 Alzheimer's disease, unspecified: Secondary | ICD-10-CM

## 2023-10-26 DIAGNOSIS — F0280 Dementia in other diseases classified elsewhere without behavioral disturbance: Secondary | ICD-10-CM

## 2023-10-26 DIAGNOSIS — Z944 Liver transplant status: Secondary | ICD-10-CM

## 2023-10-26 DIAGNOSIS — E782 Mixed hyperlipidemia: Secondary | ICD-10-CM | POA: Diagnosis not present

## 2023-10-26 DIAGNOSIS — I700 Atherosclerosis of aorta: Secondary | ICD-10-CM | POA: Diagnosis not present

## 2023-10-26 DIAGNOSIS — N1832 Chronic kidney disease, stage 3b: Secondary | ICD-10-CM

## 2023-10-26 DIAGNOSIS — N189 Chronic kidney disease, unspecified: Secondary | ICD-10-CM | POA: Diagnosis not present

## 2023-10-26 DIAGNOSIS — I10 Essential (primary) hypertension: Secondary | ICD-10-CM

## 2023-10-26 DIAGNOSIS — F0150 Vascular dementia without behavioral disturbance: Secondary | ICD-10-CM

## 2023-10-26 NOTE — Patient Instructions (Signed)
 Medication Instructions:  Your physician recommends that you continue on your current medications as directed. Please refer to the Current Medication list given to you today.  *If you need a refill on your cardiac medications before your next appointment, please call your pharmacy*  Lab Work: Your physician recommends that you return for lab work in:   Labs today: Lipid, LFT, LPa  If you have labs (blood work) drawn today and your tests are completely normal, you will receive your results only by: MyChart Message (if you have MyChart) OR A paper copy in the mail If you have any lab test that is abnormal or we need to change your treatment, we will call you to review the results.  Testing/Procedures: We will order CT coronary calcium  score. It will cost $99.00 and is due at time of scan.  Please call to schedule.    [facility] at [facility] [address] [place], [place] [address] [phone]  [facility] [address] [place], [place] [address] [phone]   Your physician has requested that you have an echocardiogram. Echocardiography is a painless test that uses sound waves to create images of your heart. It provides your doctor with information about the size and shape of your heart and how well your heart's chambers and valves are working. This procedure takes approximately one hour. There are no restrictions for this procedure. Please do NOT wear cologne, perfume, aftershave, or lotions (deodorant is allowed). Please arrive 15 minutes prior to your appointment time.  Please note: We ask at that you not bring children with you during ultrasound (echo/ vascular) testing. Due to room size and safety concerns, children are not allowed in the ultrasound rooms during exams. Our front office staff cannot provide observation of children in our lobby area while testing is being conducted. An adult accompanying a patient to their  appointment will only be allowed in the ultrasound room at the discretion of the ultrasound technician under special circumstances. We apologize for any inconvenience.   Follow-Up: At [facility], you and your health needs are our priority.  As part of our continuing mission to provide you with exceptional heart care, our providers are all part of one team.  This team includes your primary Cardiologist (physician) and Advanced Practice Providers or APPs (Physician Assistants and Nurse Practitioners) who all work together to provide you with the care you need, when you need it.  Your next appointment:   2 month(s)  Provider:   Ralene Burger, MD    We recommend signing up for the patient portal called "MyChart".  Sign up information is provided on this After Visit Summary.  MyChart is used to connect with patients for Virtual Visits (Telemedicine).  Patients are able to view lab/test results, encounter notes, upcoming appointments, etc.  Non-urgent messages can be sent to your provider as well.   To learn more about what you can do with MyChart, go to [email or web address].   Other Instructions None

## 2023-10-26 NOTE — Progress Notes (Signed)
 Cardiology Consultation:    Date:  10/26/2023   ID:  Candace Hill, DOB January 24, 1946, MRN [id number]  PCP:  Arcadio Knuckles, MD  Cardiologist:  Ralene Burger, MD   Referring MD: Arcadio Knuckles, MD   Chief Complaint  Patient presents with   Establish Care    History of Present Illness:    Candace Hill is a 78 y.o. female who is being seen today for the evaluation of atherosclerosis at the request of Arcadio Knuckles, MD. quite interesting and sweet lady she does have past medical history significant for cirrhosis of the liver and then hepatitis C that required liver transplant which was done in 2009.  She also got history of essential hypertension, dyslipidemia, she never smoked.  She was referred to us  because of microvascular ischemic changes noted in May her MRI of the brain as well as advanced intracranial atherosclerotic disease.  She would like to have her heart checked to make sure everything is fine there.  She denies having any chest pain tightness squeezing pressure burning chest.  Walking around to be somewhat difficult because of balance issue she tried to use cane but that became problematic she is in physical therapy.  There is no swelling of lower extremity she never had any heart trouble.  She does not smoke does not have family history of premature coronary artery disease.  Overall she is doing well.  She comes to my office today with her daughter who participated in decision making.  He she lives close to her the patient and she helps her a lot.  She patient was also diagnosed with vascular dementia and gradually deteriorating she is being followed by neurologist  Past Medical History:  Diagnosis Date   Abdominal pain    Acute cystitis without hematuria 03/12/2022   Allergic rhinitis 11/14/2012   Amblyopia of eye, left 06/17/2017   Asbestos exposure 04/08/2018   Asthma    1994-1994 due to black mold house, no asthma now   Atherosclerosis of aorta 01/28/2023    Broken ankle    right   Broken rib    x2   Cataract    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 03/26/2011   Chronic hepatitis C virus infection 05/31/2013   Complication of anesthesia    IV phenergan   serious mental reaction   Delusional thoughts 03/18/2022   Diarrhea 03/13/2022   Dysuria 07/29/2023   Essential hypertension 10/14/2017   Gastroesophageal reflux disease 10/14/2017   Generalized anxiety disorder    Hernia, femoral    left   History of blood transfusion 1971   acquired hepatitis c   History of kidney stones    History of liver transplant 11/14/2012   History of nephrolithiasis 10/14/2017   History of shingles    Hyperglycemia 04/08/2018   Hyperlipidemia 01/15/2021   The 10-year ASCVD risk score (Arnett DK, et al., 2019) is: 16.2%    Values used to calculate the score:      Age: 67 years      Sex: Female      Is Non-Hispanic African American: No      Diabetic: No      Tobacco smoker: No      Systolic Blood Pressure: 136 mmHg      Is BP treated: No      HDL Cholesterol: 51.2 mg/dL      Total Cholesterol: 202 mg/dL     Intrinsic eczema 40/98/1191   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   Major depressive disorder 11/14/2012   Malignant neoplasm of liver 11/14/2012   Migraine 07/02/2015   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   vascular/medical; concerns for concurrent Alzheimer's disease   Overweight (BMI 25.0-29.9) 06/28/2015   PCO (posterior capsular opacification), right 03/26/2011   Seasonal affective disorder    Splenic artery aneurysm    Stage 4 chronic kidney disease 07/29/2023   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-will get Avastin  injections off/on   Vitamin D  deficiency 04/08/2018   Weight loss, non-intentional 03/12/2022    Past Surgical History:  Procedure Laterality Date   CATARACT EXTRACTION Bilateral    CHOLECYSTECTOMY   1997   CYSTOSCOPY W/ URETERAL STENT PLACEMENT Left 08/14/2016   Procedure: CYSTOSCOPY WITH RETROGRADE PYELOGRAM/URETERAL LEFT URETEROSCOPY AND LEFT STENT PLACEMENT;  Surgeon: Florencio Hunting, MD;  Location: [facility];  Service: Urology;  Laterality: Left;   CYSTOSCOPY/URETEROSCOPY/HOLMIUM LASER/STENT PLACEMENT Left 07/06/2016   Procedure: CYSTOSCOPY/URETEROSCOPY/ BILATERAL RETROGRADE/HOLMIUM LASER/STENT PLACEMENT/BASKET STONE REMOVAL;  Surgeon: Florencio Hunting, MD;  Location: [facility];  Service: Urology;  Laterality: Left;   CYSTOSCOPY/URETEROSCOPY/HOLMIUM LASER/STENT PLACEMENT Left 08/06/2016   Procedure: CYSTOSCOPY/URETEROSCOPY/HOLMIUM LASER/STENT PLACEMENT;  Surgeon: Florencio Hunting, MD;  Location: [facility];  Service: Urology;  Laterality: Left;   CYSTOSCOPY/URETEROSCOPY/HOLMIUM LASER/STENT PLACEMENT Left 08/31/2016   Procedure: CYSTOSCOPY/URETEROSCOPY/HOLMIUM LASER/STENT PLACEMENT/BASKET STONE REMOVAL;  Surgeon: Florencio Hunting, MD;  Location: [facility];  Service: Urology;  Laterality: Left;   FEMORAL HERNIA REPAIR Left 1997   left leg-   LIVER TRANSPLANTATION     NASAL SINUS SURGERY  1997   x3   TONSILLECTOMY  age 18   TUBAL LIGATION      Current Medications: Current Meds  Medication Sig   aspirin  EC 81 MG tablet Take 81 mg by mouth daily.   atorvastatin  (LIPITOR) 10 MG tablet TAKE 1 TABLET EVERY DAY   CALCIUM -VITAMIN D  PO Take 600 mg by mouth daily.   Ferrous Sulfate  (IRON) 325 (65 Fe) MG TABS Take 1 tablet by mouth daily.   folic acid  (FOLVITE ) 400 MCG tablet Take 800 mcg by mouth daily.   lamoTRIgine  (LAMICTAL ) 25 MG tablet Take 1 tablet (25 mg total) by mouth daily.   memantine  (NAMENDA  XR) 21 MG CP24 24 hr capsule Take 1 capsule (21 mg total) by mouth daily.   sertraline  (ZOLOFT ) 100 MG tablet Take 1 tablet (100 mg total) by mouth daily. Take with 50mg  tablet daily   sertraline  (ZOLOFT ) 50 MG tablet Take 1 tablet (50 mg total) by mouth daily. Take with 100mg  tablet daily.   tacrolimus  (PROGRAF ) 0.5  MG capsule Take 0.5 mg by mouth 2 (two) times daily. 6 by mouth daily NAME BRAND ONLY     Allergies:   Codeine, Other, Penicillins, Phenergan  [promethazine ], Sulfa antibiotics, Tamsulosin , and Lidocaine    Social History   Socioeconomic History   Marital status: Divorced    Spouse name: Not on file   Number of children: 2   Years of education: 14   Highest education level: Associate degree: occupational, Scientist, product/process development, or vocational program  Occupational History   Occupation: Disabled  Tobacco Use   Smoking status: Never   Smokeless tobacco: Never  Vaping Use   Vaping status: Never Used  Substance and Sexual Activity   Alcohol use: No   Drug use: No   Sexual activity: Never    Comment: 14 years ago  Other Topics Concern   Not on file  Social History Narrative   Lives at alone alone.   Right-handed.   No caffeine use.      PSC- as of 10/12/17   Diet: No Fried Foods       Caffeine: Summer time, sweet tea       Married, if yes what year: Divorced       Do you live in a house, apartment, assisted living, condo, trailer, ect: [facility] [facility], 1 stories, and 1 person       Pets: 1 dog       Current/Past profession: Dentist       Exercise: Some, walking dog       Living Will: yes   DNR: yes   POA/HPOA: yes      Functional Status: Patient did not answer   Do you have difficulty bathing or dressing yourself?   Do you have difficulty preparing food or eating?   Do you have difficulty managing your medications?   Do you have difficulty managing your finances?   Do you have difficulty affording your medications?   Social Drivers of Corporate investment banker Strain: Low Risk  (12/24/2022)   Overall Financial Resource Strain (CARDIA)    Difficulty of Paying Living Expenses: Not hard at all  Food Insecurity: No Food Insecurity (04/30/2023)   Hunger Vital Sign    Worried About Running Out of Food in the Last Year: Never true    Ran Out of Food in the Last Year: Never  true  Transportation Needs: No Transportation Needs (04/30/2023)   PRAPARE - Administrator, Civil Service (Medical): No    Lack of Transportation (Non-Medical): No  Physical Activity: Insufficiently Active (12/24/2022)   Exercise Vital Sign    Days of Exercise per Week: 7 days    Minutes of Exercise per Session: 20 min  Stress: No Stress Concern Present (12/24/2022)   [employer] of Occupational Health - Occupational Stress Questionnaire    Feeling of Stress : Only a little  Social Connections: Moderately Isolated (12/24/2022)   Social Connection and Isolation Panel [NHANES]    Frequency of Communication with Friends and Family: More than three times a week    Frequency of Social Gatherings with Friends and Family: Twice a week    Attends Religious Services: Never    Database administrator or Organizations: Yes    Attends Banker Meetings: Never    Marital Status: Divorced     Family History: The patient's family history includes Alcoholism in her father and son; Alzheimer's disease in her mother; Arthritis in her mother; Bipolar disorder in her granddaughter; COPD in her brother and sister; Cancer in her mother; Depression in her sister; Diabetes in her mother; OCD in her daughter; Personality disorder in her daughter; Schizophrenia in her granddaughter; Stroke in her mother; Suicidality in her sister. She was adopted. ROS:   Please see the history of present illness.    All 14 point review of systems negative except as described per history of present illness.  EKGs/Labs/Other Studies Reviewed:    The following studies were reviewed today:   EKG:       Recent Labs: 01/25/2023: TSH 3.01 09/10/2023: ALT 22; BUN 16; Creatinine, Ser 1.54; Hemoglobin 11.8; Platelets 155; Potassium 5.1; Sodium 138  Recent Lipid Panel    Component Value Date/Time   CHOL 161 01/25/2023 1445   TRIG 132.0 01/25/2023 1445  HDL 52.90 01/25/2023 1445   CHOLHDL 3 01/25/2023  1445   VLDL 26.4 01/25/2023 1445   LDLCALC 82 01/25/2023 1445   LDLCALC 139 (H) 02/06/2020 0937    Physical Exam:    VS:  BP 130/80 (BP Location: Left Arm, Patient Position: Sitting)   Pulse 62   Ht 5\' 3"  (1.6 m)   Wt 132 lb (59.9 kg)   SpO2 95%   BMI 23.38 kg/m     Wt Readings from Last 3 Encounters:  10/26/23 132 lb (59.9 kg)  09/14/23 133 lb 9.6 oz (60.6 kg)  08/26/23 134 lb (60.8 kg)     GEN:  Well nourished, well developed in no acute distress HEENT: Normal NECK: No JVD; No carotid bruits LYMPHATICS: No lymphadenopathy CARDIAC: RRR, no murmurs, no rubs, no gallops RESPIRATORY:  Clear to auscultation without rales, wheezing or rhonchi  ABDOMEN: Soft, non-tender, non-distended MUSCULOSKELETAL:  No edema; No deformity  SKIN: Warm and dry NEUROLOGIC:  Alert and oriented x 3 PSYCHIATRIC:  Normal affect   ASSESSMENT:    1. Essential hypertension   2. Atherosclerosis of aorta   3. Mixed dementia   4. Splenic artery aneurysm   5. History of liver transplant   6. Mixed hyperlipidemia    PLAN:    In order of problems listed above:  Atherosclerosis of the aorta.  She is already antiplatelet therapy as well as statin which we will continue.  I will schedule her to have coronary calcium  score to have better understanding and extensibility of disease in the hide if calcium  score is high then we will proceed with stress testing if it does she does not have any typical symptoms that would indicate obstructive disease.  But history somewhat limited on top of that her ability to exercise is limited as well Dyslipidemia, I did review K PN which show me her LDL of 82 HDL 52 however this is from last year we will repeat the test and based on that we decide if we need to augment her therapy.  Because of history of liver transplant AST ALT will be done to see if it is safe to perform this maneuver. Dyspnea on exertion I will ask her to have an echocardiogram done to assess left  ventricle ejection fraction. Splenic artery aneurysm, that being followed by vascular   Medication Adjustments/Labs and Tests Ordered: Current medicines are reviewed at length with the patient today.  Concerns regarding medicines are outlined above.  Orders Placed This Encounter  Procedures   EKG 12-Lead   No orders of the defined types were placed in this encounter.   Signed, Manfred Seed, MD, [facility]. 10/26/2023 2:22 PM    [facility]

## 2023-10-26 NOTE — Addendum Note (Signed)
 Addended by: Aurelio Leer I on: 10/26/2023 02:34 PM   Modules accepted: Orders

## 2023-10-28 ENCOUNTER — Ambulatory Visit: Admitting: Physical Therapy

## 2023-10-29 ENCOUNTER — Ambulatory Visit (HOSPITAL_BASED_OUTPATIENT_CLINIC_OR_DEPARTMENT_OTHER)
Admission: RE | Admit: 2023-10-29 | Discharge: 2023-10-29 | Disposition: A | Discharge status: Home / Self Care | Payer: Self-pay | Source: Ambulatory Visit | Attending: Cardiology | Admitting: Cardiology

## 2023-10-29 DIAGNOSIS — I700 Atherosclerosis of aorta: Secondary | ICD-10-CM | POA: Insufficient documentation

## 2023-10-29 DIAGNOSIS — I10 Essential (primary) hypertension: Secondary | ICD-10-CM | POA: Insufficient documentation

## 2023-10-29 DIAGNOSIS — F0280 Dementia in other diseases classified elsewhere without behavioral disturbance: Secondary | ICD-10-CM | POA: Insufficient documentation

## 2023-10-29 DIAGNOSIS — E782 Mixed hyperlipidemia: Secondary | ICD-10-CM | POA: Insufficient documentation

## 2023-10-29 DIAGNOSIS — I728 Aneurysm of other specified arteries: Secondary | ICD-10-CM | POA: Insufficient documentation

## 2023-10-29 DIAGNOSIS — Z944 Liver transplant status: Secondary | ICD-10-CM | POA: Insufficient documentation

## 2023-10-29 DIAGNOSIS — G309 Alzheimer's disease, unspecified: Secondary | ICD-10-CM | POA: Insufficient documentation

## 2023-10-29 DIAGNOSIS — F0150 Vascular dementia without behavioral disturbance: Secondary | ICD-10-CM | POA: Insufficient documentation

## 2023-10-31 NOTE — Progress Notes (Signed)
 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 RH female with a history of severe major depressive disorder, history of liver transplant with a history of chronic hepatitis C, history of migraines, hypertension, hyperglycemia, hyperlipidemia, prior history of asbestos exposure, vitamin D  deficiency, vitamin B12 deficiency, story of PCO (right), history of stage IIIb chronic kidney disease, splenic artery aneurysm and a diagnosis of Mixed dementia of multiple etiologies, with concerns for Alzheimer's disease seen today in follow up for memory loss. Patient is currently on memantine  21 mg XR by her PCP, tolerating well. Mood is not well controlled, she has psych follow up soon. Discussed with the patient the role of social isolation and decreased activity level on worsening memory. Recommend moving to Assisted Living. She is concerned about cost, however, she is to [employer] tomorrow with Candace Hill to determine ways that she can be helped. By moving, this will be therapeutic for cognition, socialization and safety. Daughter agrees. .     Follow up in 6  months. Continue memantine  21 XR daily, side effects discussed Repeat neuropsych evaluation in 12-24 months for disease trajectory Recommend good control of her cardiovascular risk factors Continue to control mood as per behavioral health, psychotherapy for MDD Need to socialization, physical activity, consider Assisted Living Continue B12 supplements   Subjective:    This patient is accompanied in the office by her daughter who supplements the history.  Previous records as well as any outside records available were reviewed prior to todays visit. Patient was last seen on 11/18/24with MMSE 28/30      Any changes in memory since last visit? " Worse over the last 6 months, daughter says".  She has difficulty remembering recent conversations and names, how to charge  the phone, dates, etc. She watches TV especially the news.  She is not very interested in socializing outside of her house. She feels more depressed than before. repeats oneself?  Endorsed Disoriented when walking into a room?  Patient denies    Leaving objects?  May misplace things but not in unusual places   Wandering behavior?  denies   Any personality changes since last visit?  She is overwhelmed with the status of her health.  Any worsening depression?:  She is sees behavioral health, not yet very controlled, continues to cry frequently  Hallucinations or paranoia?  Denies.   Seizures? denies    Any sleep changes? Wakes up 3-4 times to urinate.  Denies vivid dreams, REM behavior or sleepwalking   Sleep apnea?   Denies.   Any hygiene concerns? Denies.  Independent of bathing and dressing?  Endorsed  Does the patient needs help with medications? Daughter in charge  Who is in charge of the finances?  Patient is in charge, daughter monitors     Any changes in appetite?  May be decreased.  Patient have trouble swallowing? Denies.   Does the patient cook? No Any headaches?   denies   Vision changes? She is left eye blind. She feels that hr vision is worse. She sees ophthalmology soon. Chronic back pain  denies   Ambulates with difficulty? Not walking because she is afraid of falling.   Does not want to use the cane, she is entertaining a walker.  Recent falls or head injuries? Denies.     Unilateral weakness, numbness or tingling? Denies.   Any tremors?  Denies   Any anosmia?  Denies  Any incontinence of urine?  Endorsed, wears pads. Any bowel dysfunction?   Denies      Patient lives alone in [place]  Does the patient drive? No longer drives because of her vision "I am more dependent on my daughter".      Initial Visit Dr. Ty Gales 7/7/21This is a pleasant 78 year old right-handed woman with a history of hypertension, hepatitis C, liver cancer s/p liver transplant, left retinal vein  occlusion, TIA, presenting for evaluation of memory loss. She is verbose and slightly tangential. She saw her PCP in January 2021 reporting that her nurse friend is concerned about her memory. She was told she should be getting more things done and is having trouble with this. She was in the process of moving and was having a hard time, she moved last December. She says  "I've always pushed myself too much." She has noticed memory changes herself, she cannot remember things in a row. 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 She lives alone. She denies getting lost driving. She denies missing medications. She has to juggle things so she could keep up with things in general. MMSE 28/30 in July 2020. She now lives in senior independent living at [facility]. She states she was adopted, and that her mother had Alzheimer's disease. She denies any significant head injuries. No alcohol use.    She is supposed to follow-up in [facility] for her liver transplant, but keeps saying that [facility] screwed up. She states "they went me the fake antirejection medication" and Medicare called her about it, she was sent the generic that is cheaper. She reports left retinal vein occlusion as a result of her steroids for her transplant. She has 7% vision on the left. She recalls and ruminates on a fall in February 2020 when she was in the bank and broke her nose and 2 front teeth, saying "someone caused me to have an accident." She states she has not been okay since then. She falls more, feeling dizzy with spinning sensation. She cannot climb steps because she gets very dizzy. She reports an incident last week on the phone, she could not understand anything she was trying to say, gibberish was coming out. This lasted a few hours, the next morning she started to talk. She recalls having a headache at that time. She denied any focal weakness. She started B12  supplements in March 2021, B12 level was 248.     Neuropsych evaluation 09/2023  Briefly, results suggested prominent impairments surrounding processing speed, verbal fluency, and both encoding (i.e., learning) and delayed retrieval aspects of memory. Further performance variability was exhibited across executive functioning, visuospatial abilities, and recognition/consolidation aspects of memory. Relative to her previous neuropsychological evaluation in October 2023, mild decline was exhibited across processing speed, cognitive flexibility, and delayed retrieval/recognition aspects involving visual memory. Some intra-domain variability was exhibited across basic attention where one task saw decline and another exhibited stability. An improvement was exhibited across a task assessing receptive language (this performance arose back to 2021 levels). Other assessed cognitive domains exhibited relative stability. Across mood-related questionnaires, Candace Hill did report notably decreased levels of acute anxiety and depression. The etiology for Candace Hill's mild dementia presentation is likely multifactorial in nature (i.e., a mixed dementia). There is a good likelihood of a notable vascular contribution given neuroimaging suggesting progressive microvascular ischemic disease and a prior small stroke involving the corpus callosum. Medically, Candace Hill  has a large degree of chronic medical ailments which also can reasonably impact cognitive functioning (e.g., hypertension, hyperlipidemia, aortic atherosclerosis, stage 3kidney disease, and vitamin deficiencies). She further noted ongoing sleep dysfunction during interview, including a "flipped" sleep schedule. While questionnaires suggest improved psychiatric symptoms relative to October 2023, she did report ongoing anxiety and depression during the current interview. This combination of chronic medical ailments, psychiatric distress, sleep dysfunction, and  cerebrovascular disease can certainly create ongoing cognitive impairment. Her greatest areas of impairment surrounding processing speed, fluency, and the learning aspects of memory align with expectations of this combination well. Going back to her 2021 evaluation, there has been objective evidence for decline surrounding semantic fluency and both delayed retrieval and recognition/consolidation aspects of memory. Across current memory testing. Candace Hill was fully amnestic (i.e., 0% retention) across both list and figure-based memory tasks after a brief delay and performed quite poorly across recognition trials. Amnestic memory and evidence for decline over time would be unexpected for a pure vascular/medical dementia presentation and does align with expectations surrounding Alzheimer's disease. Decline surrounding semantic fluency also aligns with Alzheimer's disease. Appropriate story retention and confrontation naming performances continues to be encouraging. If there is an underlying Alzheimer's disease component to her current presentation, this aspect appears to be progressing slowly and would still be in earlier stages. Continued medical monitoring will be important moving forward.    PREVIOUS MEDICATIONS:   CURRENT MEDICATIONS:  Outpatient Encounter Medications as of 11/01/2023  Medication Sig   aspirin  EC 81 MG tablet Take 81 mg by mouth daily.   atorvastatin  (LIPITOR) 10 MG tablet TAKE 1 TABLET EVERY DAY   CALCIUM -VITAMIN D  PO Take 600 mg by mouth daily.   Ferrous Sulfate  (IRON) 325 (65 Fe) MG TABS Take 1 tablet by mouth daily.   folic acid  (FOLVITE ) 400 MCG tablet Take 800 mcg by mouth daily.   lamoTRIgine  (LAMICTAL ) 25 MG tablet Take 1 tablet (25 mg total) by mouth daily.   memantine  (NAMENDA  XR) 21 MG CP24 24 hr capsule Take 1 capsule (21 mg total) by mouth daily.   sertraline  (ZOLOFT ) 100 MG tablet Take 1 tablet (100 mg total) by mouth daily. Take with 50mg  tablet daily   sertraline   (ZOLOFT ) 50 MG tablet Take 1 tablet (50 mg total) by mouth daily. Take with 100mg  tablet daily.   tacrolimus  (PROGRAF ) 0.5 MG capsule Take 0.5 mg by mouth 2 (two) times daily. 6 by mouth daily NAME BRAND ONLY   No facility-administered encounter medications on file as of 11/01/2023.       05/03/2023   12:00 PM 11/11/2022    4:00 PM 12/23/2018    9:09 AM  MMSE - Mini Mental State Exam  Orientation to time 4 5 4   Orientation to Place 5 5 4   Registration 3 3 3   Attention/ Calculation 5 5 5   Recall 2 2 3   Language- name 2 objects 2 2 2   Language- repeat 1 1 1   Language- follow 3 step command 3 3 3   Language- read & follow direction 1 1 1   Write a sentence 1 1 1   Copy design 1 1 1   Total score 28 29 28       07/29/2021   11:00 AM  Montreal Cognitive Assessment   Visuospatial/ Executive (0/5) 4  Naming (0/3) 3  Attention: Read list of digits (0/2) 2  Attention: Read list of letters (0/1) 1  Attention: Serial 7 subtraction starting at 100 (0/3) 1  Language: Repeat phrase (0/2)  2  Language : Fluency (0/1) 1  Abstraction (0/2) 2  Delayed Recall (0/5) 0  Orientation (0/6) 6  Total 22  Adjusted Score (based on education) 22    Objective:     PHYSICAL EXAMINATION:    VITALS:   Vitals:   11/01/23 1509  BP: (!) 148/78  Pulse: 74  Resp: 18  SpO2: 98%  Weight: 132 lb (59.9 kg)  Height: 5\' 3"  (1.6 m)    GEN:  The patient appears stated age and is in NAD. HEENT:  Normocephalic, atraumatic.   Neurological examination:  General: NAD, well-groomed, appears stated age. Orientation: The patient is alert. Oriented to person, place and not tod ate Cranial nerves: There is good facial symmetry.T he speech is fluent and clear, tangential at times. No aphasia or dysarthria. Fund of knowledge is appropriate. Recent and remote memory are impaired. Attention and concentration are reduced.  Able to name objects and repeat phrases.  Hearing is intact to conversational tone.   Sensation:  Sensation is intact to light touch throughout Motor: Strength is at least antigravity x4. DTR's 2/4 in UE/LE     Movement examination: Tone: There is normal tone in the UE/LE  Abnormal movements:  no tremor.  No myoclonus.  No asterixis.   Coordination:  There is no decremation with RAM's. Normal finger to nose  Gait and Station: The patient has no difficulty arising out of a deep-seated chair without the use of the hands. The patient's stride length is good, hesitant at times.  Gait is cautious and narrow.    Thank you for allowing us  the opportunity to participate in the care of this nice patient. Please do not hesitate to contact us  for any questions or concerns.   Total time spent on today's visit was 33 minutes dedicated to this patient today, preparing to see patient, examining the patient, ordering tests and/or medications and counseling the patient, documenting clinical information in the EHR or other health record, independently interpreting results and communicating results to the patient/family, discussing treatment and goals, answering patient's questions and coordinating care.  Cc:  Arcadio Knuckles, MD  Tex Filbert 11/01/2023 6:23 PM

## 2023-11-01 ENCOUNTER — Ambulatory Visit: Payer: Medicare HMO | Admitting: Physician Assistant

## 2023-11-01 ENCOUNTER — Encounter: Payer: Self-pay | Admitting: Physician Assistant

## 2023-11-01 VITALS — BP 148/78 | HR 74 | Resp 18 | Ht 63.0 in | Wt 132.0 lb

## 2023-11-01 DIAGNOSIS — F0280 Dementia in other diseases classified elsewhere without behavioral disturbance: Secondary | ICD-10-CM

## 2023-11-01 DIAGNOSIS — F0150 Vascular dementia without behavioral disturbance: Secondary | ICD-10-CM

## 2023-11-01 DIAGNOSIS — G309 Alzheimer's disease, unspecified: Secondary | ICD-10-CM | POA: Diagnosis not present

## 2023-11-01 DIAGNOSIS — Z944 Liver transplant status: Principal | ICD-10-CM

## 2023-11-01 DIAGNOSIS — E612 Magnesium deficiency: Principal | ICD-10-CM

## 2023-11-01 DIAGNOSIS — Z5181 Encounter for therapeutic drug level monitoring: Principal | ICD-10-CM

## 2023-11-01 NOTE — Patient Instructions (Signed)
 It was a pleasure to see you today at our office. 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 You also have Adjustment disorder which may affect your memory.   Recommendations:  Follow up in 6  months Continue psychotherapy  to help your mood  Continue Memantine   21 Mg XR tablets twice day Increase your activity level  Mediterranean diet  Consider Assisted Living  Whom to call:  Memory  decline, memory medications: Call our office [phone]   For psychiatric meds, mood meds: Please have your primary care physician manage these medications.    If you have any severe symptoms of a stroke, or other severe issues such as confusion,severe chills or fever, etc call 911 or go to the ER as you may need to be evaluated further       RECOMMENDATIONS FOR ALL PATIENTS WITH MEMORY PROBLEMS: 1. Continue to exercise (Recommend 30 minutes of walking everyday, or 3 hours every week) 2. Increase social interactions - continue going to [place] and enjoy social gatherings with friends and family 3. Eat healthy, avoid fried foods and eat more fruits and vegetables 4. Maintain adequate blood pressure, blood sugar, and blood cholesterol level. Reducing the risk of stroke and cardiovascular disease also helps promoting better memory. 5. Avoid stressful situations. Live a simple life and avoid aggravations. Organize your time and prepare for the next day in anticipation. 6. Sleep well, avoid any interruptions of sleep and avoid any distractions in the bedroom that may interfere with adequate sleep quality 7. Avoid sugar, avoid sweets as there is a strong link between excessive sugar intake, diabetes, and cognitive impairment We discussed the Mediterranean diet, which has been shown to help patients reduce the risk of progressive memory disorders and reduces cardiovascular risk. This  includes eating fish, eat fruits and green leafy vegetables, nuts like almonds and hazelnuts, walnuts, and also use olive oil. Avoid fast foods and fried foods as much as possible. Avoid sweets and sugar as sugar use has been linked to worsening of memory function.  There is always a concern of gradual progression of memory problems. If this is the case, then we may need to adjust level of care according to patient needs. Support, both to the patient and caregiver, should then be put into place.    FALL PRECAUTIONS: Be cautious when walking. Scan the area for obstacles that may increase the risk of trips and falls. When getting up in the mornings, sit up at the edge of the bed for a few minutes before getting out of bed. Consider elevating the bed at the head end to avoid drop of blood pressure when getting up. Walk always in a well-lit room (use night lights in the walls). Avoid area rugs or power cords from appliances in the middle of the walkways. Use a walker or a cane if necessary and consider physical therapy for balance exercise. Get your eyesight checked regularly.  FINANCIAL OVERSIGHT: Supervision, especially oversight when making financial decisions or transactions is also recommended.  HOME SAFETY: Consider the safety of the kitchen when operating appliances like stoves, microwave oven, and blender. Consider having supervision and share cooking responsibilities until no longer able to participate in those. Accidents with firearms and other hazards in the house should be identified and addressed as well.   ABILITY TO BE LEFT ALONE: If patient is unable to contact 911 operator, consider using [employer],  or when the need is there, arrange for someone to stay with patients. Smoking is a fire hazard, consider supervision or cessation. Risk of wandering should be assessed by caregiver and if detected at any point, supervision and safe proof recommendations should be instituted.  MEDICATION  SUPERVISION: Inability to self-administer medication needs to be constantly addressed. Implement a mechanism to ensure safe administration of the medications.   DRIVING: Regarding driving, in patients with progressive memory problems, driving will be impaired. We advise to have someone else do the driving if trouble finding directions or if minor accidents are reported. Independent driving assessment is available to determine safety of driving.   If you are interested in the driving assessment, you can contact the following:  [facility] [employer] [facility] in [place] [phone]  [facility] [phone]  [facility] [phone]  [facility] [phone] or [phone]

## 2023-11-02 ENCOUNTER — Ambulatory Visit: Payer: Self-pay | Admitting: Cardiology

## 2023-11-02 ENCOUNTER — Telehealth: Payer: Self-pay

## 2023-11-02 NOTE — Telephone Encounter (Signed)
 Left message on My Chart with Ca Score results per Dr. Vanetta Shawl note. Routed to PCP.

## 2023-11-08 DIAGNOSIS — Z944 Liver transplant status: Principal | ICD-10-CM

## 2023-11-08 DIAGNOSIS — Z5181 Encounter for therapeutic drug level monitoring: Principal | ICD-10-CM

## 2023-11-08 DIAGNOSIS — E612 Magnesium deficiency: Principal | ICD-10-CM

## 2023-11-09 DIAGNOSIS — Z944 Liver transplant status: Principal | ICD-10-CM

## 2023-11-09 MED ORDER — TACROLIMUS 0.5 MG CAPSULE, IMMEDIATE-RELEASE
ORAL_CAPSULE | Freq: Two times a day (BID) | ORAL | 4 refills | 90.00000 days
Start: 2023-11-09 — End: ?

## 2023-11-09 NOTE — Telephone Encounter (Signed)
 Hello,    Pt is requesting for a refill of   lamoTRIgine  (LAMICTAL ) 25 MG tablet   To be sent in.    Last seen: 5/02 Last filled: 10/11/23

## 2023-11-09 NOTE — Therapy (Signed)
 OUTPATIENT PHYSICAL THERAPY TREATMENT   Patient Name: Candace Hill MRN: [id number] DOB:[birth year], 78 y.o., female Today's Date: 11/10/2023  END OF SESSION:  PT End of Session - 11/10/23 1629     Visit Number 2    Number of Visits 17    Authorization Type [employer] MCR    Authorization Time Period 10/14/23 to 12/23/23    Progress Note Due on Visit 10    PT Start Time 1629    PT Stop Time 1700    PT Time Calculation (min) 31 min    Activity Tolerance Patient tolerated treatment well    Behavior During Therapy [facility] for tasks assessed/performed              Past Medical History:  Diagnosis Date   Abdominal pain    Acute cystitis without hematuria 03/12/2022   Allergic rhinitis 11/14/2012   Amblyopia of eye, left 06/17/2017   Asbestos exposure 04/08/2018   Asthma    [id number] due to black mold house, no asthma now   Atherosclerosis of aorta 01/28/2023   Broken ankle    right   Broken rib    x2   Cataract    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 03/26/2011   Chronic hepatitis C virus infection 05/31/2013   Complication of anesthesia    IV phenergan   serious mental reaction   Delusional thoughts 03/18/2022   Diarrhea 03/13/2022   Dysuria 07/29/2023   Essential hypertension 10/14/2017   Gastroesophageal reflux disease 10/14/2017   Generalized anxiety disorder    Hernia, femoral    left   History of blood transfusion 1971   acquired hepatitis c   History of kidney stones    History of liver transplant 11/14/2012   History of nephrolithiasis 10/14/2017   History of shingles    Hyperglycemia 04/08/2018   Hyperlipidemia 01/15/2021   The 10-year ASCVD risk score (Arnett DK, et al., 2019) is: 16.2%    Values used to calculate the score:      Age: 55 years      Sex: Female      Is Non-Hispanic African American: No      Diabetic: No      Tobacco smoker: No      Systolic Blood Pressure: 136 mmHg      Is BP treated: No      HDL Cholesterol: 51.2 mg/dL       Total Cholesterol: 202 mg/dL     Intrinsic eczema 40/03/2724   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   Major depressive disorder 11/14/2012   Malignant neoplasm of liver 11/14/2012   Migraine 07/02/2015   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   vascular/medical; concerns for concurrent Alzheimer's disease   Overweight (BMI 25.0-29.9) 06/28/2015   PCO (posterior capsular opacification), right 03/26/2011   Seasonal affective disorder    Splenic artery aneurysm    Stage 4 chronic kidney disease 07/29/2023   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-will get Avastin  injections off/on   Vitamin D  deficiency 04/08/2018   Weight loss, non-intentional 03/12/2022   Past Surgical History:  Procedure Laterality Date   CATARACT EXTRACTION Bilateral    CHOLECYSTECTOMY  1997   CYSTOSCOPY W/ URETERAL STENT PLACEMENT Left 08/14/2016   Procedure: CYSTOSCOPY WITH RETROGRADE PYELOGRAM/URETERAL LEFT URETEROSCOPY AND LEFT STENT PLACEMENT;  Surgeon: Florencio Hunting, MD;  Location:  [facility];  Service: Urology;  Laterality: Left;   CYSTOSCOPY/URETEROSCOPY/HOLMIUM LASER/STENT PLACEMENT Left 07/06/2016   Procedure: CYSTOSCOPY/URETEROSCOPY/ BILATERAL RETROGRADE/HOLMIUM LASER/STENT PLACEMENT/BASKET STONE REMOVAL;  Surgeon: Florencio Hunting, MD;  Location: [facility];  Service: Urology;  Laterality: Left;   CYSTOSCOPY/URETEROSCOPY/HOLMIUM LASER/STENT PLACEMENT Left 08/06/2016   Procedure: CYSTOSCOPY/URETEROSCOPY/HOLMIUM LASER/STENT PLACEMENT;  Surgeon: Florencio Hunting, MD;  Location: [facility];  Service: Urology;  Laterality: Left;   CYSTOSCOPY/URETEROSCOPY/HOLMIUM LASER/STENT PLACEMENT Left 08/31/2016   Procedure: CYSTOSCOPY/URETEROSCOPY/HOLMIUM LASER/STENT PLACEMENT/BASKET STONE REMOVAL;  Surgeon: Florencio Hunting, MD;  Location: [facility];  Service: Urology;  Laterality: Left;   FEMORAL HERNIA REPAIR Left 1997   left leg-   LIVER  TRANSPLANTATION     NASAL SINUS SURGERY  1997   x3   TONSILLECTOMY  age 57   TUBAL LIGATION     Patient Active Problem List   Diagnosis Date Noted   Iron deficiency anemia 10/25/2023   Ataxia 10/07/2023   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   Stage 4 chronic kidney disease 07/29/2023   Need for varicella vaccine 01/28/2023   Atherosclerosis of aorta 01/28/2023   Splenic artery aneurysm 03/18/2022   Lacunar infarction 03/18/2022   Generalized anxiety disorder    Intrinsic eczema 01/15/2021   Hyperlipidemia 01/15/2021   Vitamin D  deficiency 04/08/2018   Essential hypertension 10/14/2017   Gastroesophageal reflux disease 10/14/2017   Major depressive disorder 11/14/2012   Allergic rhinitis 11/14/2012   History of liver transplant 11/14/2012   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 03/26/2011   PCO (posterior capsular opacification), right 03/26/2011    PCP: Arcadio Knuckles, MD  REFERRING PROVIDER: Arcadio Knuckles, MD  REFERRING DIAG:  Diagnosis  I63.81 (ICD-10-CM) - Lacunar infarction (HCC)  R27.0 (ICD-10-CM) - Ataxia    THERAPY DIAG:  Unsteadiness on feet  Repeated falls  Other abnormalities of gait and mobility  Muscle weakness (generalized)  Rationale for Evaluation and Treatment: Rehabilitation  ONSET DATE: 10/07/23 (MD referral)  SUBJECTIVE:   SUBJECTIVE STATEMENT: No falls since last month. I am doing alright now but juggling a lot of doctor's appt all at once.   EVAL-Balance is difficult for me. Not sure when I had the stroke, was in the ED 2x due to low blood pressures, it is up and down at home but I do check it regularly. The ED told me I needed a cane when I left.   PERTINENT HISTORY: See above  PAIN:  Are you having pain? No 0/10  PRECAUTIONS:  Fall and Other: no vision L eye, poor vision R eye   RED FLAGS: None   WEIGHT BEARING RESTRICTIONS: No  FALLS:  Has patient fallen in last 6 months? Yes. Number of falls unsure but  has had recent falls   LIVING ENVIRONMENT: Lives with: lives alone but family is very involved and assists her quite a bit  Lives in: House/apartment Stairs: No Has following equipment at home: Single point cane  OCCUPATION: retired, used to work in Air[employer] pilot   PLOF: Independent, Independent with basic ADLs, Independent with gait, and Independent with transfers  PATIENT GOALS: learn how to use cane, get balance a little better   NEXT MD VISIT: 12/14/23 with referring  OBJECTIVE:  Note: Objective measures were completed at Evaluation unless otherwise noted.    PATIENT SURVEYS:  ABC scale 43.8%  COGNITION: Overall cognitive status: History of cognitive impairments - at baseline        LOWER EXTREMITY MMT:  MMT Right eval Left eval  Hip flexion  4 4  Hip extension    Hip abduction 4 seated 4 seated   Hip adduction    Hip internal rotation    Hip external rotation    Knee flexion 4+ 4+  Knee extension 4+ 4+  Ankle dorsiflexion    Ankle plantarflexion    Ankle inversion    Ankle eversion     (Blank rows = not tested)   FUNCTIONAL TESTS:   5xSTS UEs on thighs 19 seconds TUG 13.5 seconds no device 540ft minA                                                                                                                                 TREATMENT DATE:  11/10/23 NuStep L5x34mins  LAQ 2x10 HS curls red 2x10  Ball squeeze 2x10 TUG- 11s 5xSTS- 18s  10/14/23   BP 106/66 HR 78  Eval, care planning       PATIENT EDUCATION:  Education details: exam findings, care plan  Person educated: Patient Education method: Explanation Education comprehension: verbalized understanding and needs further education  HOME EXERCISE PROGRAM:   2nd session   ASSESSMENT:  CLINICAL IMPRESSION: Patient arrives ~68mins late to appt. With the shortened session, we focused on some low level strengthening for her lower extremities. She is not walking with an AD, she felt that  it was impeding her. She was unsteady with gait after getting off of NuStep. Some redirecting needed throughout session to keep her on task.   Patient is a 78 y.o. F who was seen today for physical therapy evaluation and treatment for  Diagnosis  I63.81 (ICD-10-CM) - Lacunar infarction (HCC)  R27.0 (ICD-10-CM) - Ataxia  . Unfortunately she was a bit of a poor historian due to known memory problems/dementia- perseverated on being shocked that medical supply store did not teach her how to use her new cane. See objectives as above. Will make every effort to improve function and reduce fall risk.   OBJECTIVE IMPAIRMENTS: Abnormal gait, decreased activity tolerance, decreased balance, decreased cognition, decreased knowledge of use of DME, decreased mobility, difficulty walking, decreased strength, and decreased safety awareness.   ACTIVITY LIMITATIONS: standing, squatting, stairs, transfers, and locomotion level  PARTICIPATION LIMITATIONS: meal prep, cleaning, laundry, shopping, community activity, occupation, and yard work  PERSONAL FACTORS: Age, Fitness, Past/current experiences, Social background, and Time since onset of injury/illness/exacerbation are also affecting patient's functional outcome.   REHAB POTENTIAL: Good  CLINICAL DECISION MAKING: Stable/uncomplicated  EVALUATION COMPLEXITY: Low   GOALS: Goals reviewed with patient? No  SHORT TERM GOALS: Target date: 11/11/2023   Will be compliant with appropriate progressive HEP with MinA from caregivers  Baseline: Goal status: ongoing 11/10/23  2.  Will complete TUG test in 10 seconds without AD  Baseline:  Goal status: 11s ongoing 11/10/23  3.  Will demonstrate consistent correct sequencing with LRAD Baseline:  Goal status: not using AD 11/10/23  4.  Will complete 5xSTS test  in 13 seconds or less without UEs  Baseline:  Goal status: 18s ongoing 11/10/23 Goes slow d/t OH    LONG TERM GOALS: Target date: 12/09/2023    MMT  to be 5/5 in all tested groups  Baseline:  Goal status: INITIAL  2.  Will ambulate >676ft in with S, no unsteadiness  Baseline:  Goal status: INITIAL  3.  Will score at least 20 on DGI to show reduced fall risk  Baseline:  Goal status: INITIAL  4.  Will demonstrate ability to ambulate on outdoor/uneven surfaces with and without LRAD to show reduced fall risk in community  Baseline:  Goal status: INITIAL  5.  ABC score to improve by 20% Baseline:  Goal status: INITIAL    PLAN:  PT FREQUENCY: 1-2x/week  PT DURATION: 8 weeks  PLANNED INTERVENTIONS: 97750- Physical Performance Testing, 97110-Therapeutic exercises, 97530- Therapeutic activity, [id number]- Neuromuscular re-education, 97535- Self Care, 16109- Manual therapy, and 97116- Gait training  PLAN FOR NEXT SESSION: needs HEP. Keep it simple and functional for her. Strength, conditioning, balance.   Donavon Fudge PT, DPT 11/10/23 5:00 PM

## 2023-11-09 NOTE — Unmapped (Signed)
 [facility] Refill Coordination Note    Specialty Medication(s) to be Shipped:   Transplant: tacrolimus 0.5mg     Other medication(s) to be shipped: No additional medications requested for fill at this time     Kristy Hanson, DOB: March 06, 1946  Phone: [phone] (home)       All above HIPAA information was verified with patient's family member, Daughter April. .     Was a translator used for this call? No    Completed refill call assessment today to schedule patient's medication shipment from the [facility]  [phone]).  All relevant notes have been reviewed.     Specialty medication(s) and dose(s) confirmed: Regimen is correct and unchanged.   Changes to medications: [place] reports no changes at this time.  Changes to insurance: No  New side effects reported not previously addressed with a pharmacist or physician: None reported  Questions for the pharmacist: No    Confirmed patient received a Conservation officer, historic buildings and a Surveyor, mining with first shipment. The patient will receive a drug information handout for each medication shipped and additional FDA Medication Guides as required.       DISEASE/MEDICATION-SPECIFIC INFORMATION        N/A    SPECIALTY MEDICATION ADHERENCE     Medication Adherence    Patient reported X missed doses in the last month: 0  Specialty Medication: tacrolimus 0.5 MG capsule (PROGRAF)  Patient is on additional specialty medications: No              Were doses missed due to medication being on hold? No     tacrolimus 0.5 MG capsule (PROGRAF): 3-4 days of medicine on hand       REFERRAL TO PHARMACIST     Referral to the pharmacist: Not needed      [facility]     Shipping address confirmed in Epic.     Cost and Payment: Patient has a copay of $47.00. They are aware and have authorized the pharmacy to charge the credit card on file.    Delivery Scheduled: Yes, Expected medication delivery date: 11/16/2023.     Medication will be delivered via UPS to the prescription address in Epic WAM.    Lanny Plan   [facility]  Specialty [facility] Refill Coordination Note

## 2023-11-10 ENCOUNTER — Ambulatory Visit

## 2023-11-10 DIAGNOSIS — R296 Repeated falls: Secondary | ICD-10-CM | POA: Diagnosis not present

## 2023-11-10 DIAGNOSIS — R2681 Unsteadiness on feet: Secondary | ICD-10-CM

## 2023-11-10 DIAGNOSIS — R270 Ataxia, unspecified: Secondary | ICD-10-CM | POA: Diagnosis not present

## 2023-11-10 DIAGNOSIS — R2689 Other abnormalities of gait and mobility: Secondary | ICD-10-CM

## 2023-11-10 DIAGNOSIS — M6281 Muscle weakness (generalized): Secondary | ICD-10-CM | POA: Diagnosis not present

## 2023-11-10 DIAGNOSIS — I6381 Other cerebral infarction due to occlusion or stenosis of small artery: Secondary | ICD-10-CM | POA: Diagnosis not present

## 2023-11-10 MED ORDER — TACROLIMUS 0.5 MG CAPSULE, IMMEDIATE-RELEASE
ORAL_CAPSULE | Freq: Two times a day (BID) | ORAL | 3 refills | 90.00000 days | Status: CP
Start: 2023-11-10 — End: ?
  Filled 2023-11-15: qty 180, 30d supply, fill #0

## 2023-11-11 DIAGNOSIS — H26491 Other secondary cataract, right eye: Secondary | ICD-10-CM | POA: Diagnosis not present

## 2023-11-11 DIAGNOSIS — Z961 Presence of intraocular lens: Secondary | ICD-10-CM | POA: Diagnosis not present

## 2023-11-11 DIAGNOSIS — Z8673 Personal history of transient ischemic attack (TIA), and cerebral infarction without residual deficits: Secondary | ICD-10-CM | POA: Diagnosis not present

## 2023-11-11 DIAGNOSIS — H4010X0 Unspecified open-angle glaucoma, stage unspecified: Secondary | ICD-10-CM | POA: Diagnosis not present

## 2023-11-11 DIAGNOSIS — H34812 Central retinal vein occlusion, left eye, with macular edema: Secondary | ICD-10-CM | POA: Diagnosis not present

## 2023-11-15 ENCOUNTER — Ambulatory Visit: Attending: Internal Medicine | Admitting: Physical Therapy

## 2023-11-15 ENCOUNTER — Encounter: Payer: Self-pay | Admitting: Physical Therapy

## 2023-11-15 DIAGNOSIS — R2681 Unsteadiness on feet: Secondary | ICD-10-CM | POA: Insufficient documentation

## 2023-11-15 DIAGNOSIS — R296 Repeated falls: Secondary | ICD-10-CM | POA: Insufficient documentation

## 2023-11-15 DIAGNOSIS — M6281 Muscle weakness (generalized): Secondary | ICD-10-CM | POA: Insufficient documentation

## 2023-11-15 DIAGNOSIS — R2689 Other abnormalities of gait and mobility: Secondary | ICD-10-CM | POA: Diagnosis not present

## 2023-11-15 DIAGNOSIS — Z5181 Encounter for therapeutic drug level monitoring: Principal | ICD-10-CM

## 2023-11-15 DIAGNOSIS — E612 Magnesium deficiency: Principal | ICD-10-CM

## 2023-11-15 DIAGNOSIS — Z944 Liver transplant status: Principal | ICD-10-CM

## 2023-11-15 NOTE — Therapy (Signed)
 OUTPATIENT PHYSICAL THERAPY TREATMENT   Patient Name: Candace Hill MRN: [id number] DOB:1946-01-20, 78 y.o., female Today's Date: 11/15/2023  END OF SESSION:  PT End of Session - 11/15/23 1527     Visit Number 3    Number of Visits 17    Authorization Type [employer] MCR    Authorization Time Period 10/14/23 to 12/23/23    Progress Note Due on Visit 10    PT Start Time 1529   pt late   PT Stop Time 1557    PT Time Calculation (min) 28 min    Activity Tolerance Patient tolerated treatment well    Behavior During Therapy [facility] for tasks assessed/performed               Past Medical History:  Diagnosis Date   Abdominal pain    Acute cystitis without hematuria 03/12/2022   Allergic rhinitis 11/14/2012   Amblyopia of eye, left 06/17/2017   Asbestos exposure 04/08/2018   Asthma    [id number] due to black mold house, no asthma now   Atherosclerosis of aorta 01/28/2023   Broken ankle    right   Broken rib    x2   Cataract    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 03/26/2011   Chronic hepatitis C virus infection 05/31/2013   Complication of anesthesia    IV phenergan   serious mental reaction   Delusional thoughts 03/18/2022   Diarrhea 03/13/2022   Dysuria 07/29/2023   Essential hypertension 10/14/2017   Gastroesophageal reflux disease 10/14/2017   Generalized anxiety disorder    Hernia, femoral    left   History of blood transfusion 1971   acquired hepatitis c   History of kidney stones    History of liver transplant 11/14/2012   History of nephrolithiasis 10/14/2017   History of shingles    Hyperglycemia 04/08/2018   Hyperlipidemia 01/15/2021   The 10-year ASCVD risk score (Arnett DK, et al., 2019) is: 16.2%    Values used to calculate the score:      Age: 82 years      Sex: Female      Is Non-Hispanic African American: No      Diabetic: No      Tobacco smoker: No      Systolic Blood Pressure: 136 mmHg      Is BP treated: No      HDL Cholesterol:  51.2 mg/dL      Total Cholesterol: 202 mg/dL     Intrinsic eczema 29/51/8841   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   Major depressive disorder 11/14/2012   Malignant neoplasm of liver 11/14/2012   Migraine 07/02/2015   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   vascular/medical; concerns for concurrent Alzheimer's disease   Overweight (BMI 25.0-29.9) 06/28/2015   PCO (posterior capsular opacification), right 03/26/2011   Seasonal affective disorder    Splenic artery aneurysm    Stage 4 chronic kidney disease 07/29/2023   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-will get Avastin  injections off/on   Vitamin D  deficiency 04/08/2018   Weight loss, non-intentional 03/12/2022   Past Surgical History:  Procedure Laterality Date   CATARACT EXTRACTION Bilateral    CHOLECYSTECTOMY  1997   CYSTOSCOPY W/ URETERAL STENT PLACEMENT Left 08/14/2016   Procedure: CYSTOSCOPY WITH RETROGRADE PYELOGRAM/URETERAL LEFT URETEROSCOPY AND LEFT STENT PLACEMENT;  Surgeon: Julietta Ogren  Rozanne Corners, MD;  Location: [facility];  Service: Urology;  Laterality: Left;   CYSTOSCOPY/URETEROSCOPY/HOLMIUM LASER/STENT PLACEMENT Left 07/06/2016   Procedure: CYSTOSCOPY/URETEROSCOPY/ BILATERAL RETROGRADE/HOLMIUM LASER/STENT PLACEMENT/BASKET STONE REMOVAL;  Surgeon: Florencio Hunting, MD;  Location: [facility];  Service: Urology;  Laterality: Left;   CYSTOSCOPY/URETEROSCOPY/HOLMIUM LASER/STENT PLACEMENT Left 08/06/2016   Procedure: CYSTOSCOPY/URETEROSCOPY/HOLMIUM LASER/STENT PLACEMENT;  Surgeon: Florencio Hunting, MD;  Location: [facility];  Service: Urology;  Laterality: Left;   CYSTOSCOPY/URETEROSCOPY/HOLMIUM LASER/STENT PLACEMENT Left 08/31/2016   Procedure: CYSTOSCOPY/URETEROSCOPY/HOLMIUM LASER/STENT PLACEMENT/BASKET STONE REMOVAL;  Surgeon: Florencio Hunting, MD;  Location: [facility];  Service: Urology;  Laterality: Left;   FEMORAL HERNIA REPAIR Left 1997   left leg-    LIVER TRANSPLANTATION     NASAL SINUS SURGERY  1997   x3   TONSILLECTOMY  age 82   TUBAL LIGATION     Patient Active Problem List   Diagnosis Date Noted   Iron deficiency anemia 10/25/2023   Ataxia 10/07/2023   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   Stage 4 chronic kidney disease 07/29/2023   Need for varicella vaccine 01/28/2023   Atherosclerosis of aorta 01/28/2023   Splenic artery aneurysm 03/18/2022   Lacunar infarction 03/18/2022   Generalized anxiety disorder    Intrinsic eczema 01/15/2021   Hyperlipidemia 01/15/2021   Vitamin D  deficiency 04/08/2018   Essential hypertension 10/14/2017   Gastroesophageal reflux disease 10/14/2017   Major depressive disorder 11/14/2012   Allergic rhinitis 11/14/2012   History of liver transplant 11/14/2012   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 03/26/2011   PCO (posterior capsular opacification), right 03/26/2011    PCP: Arcadio Knuckles, MD  REFERRING PROVIDER: Arcadio Knuckles, MD  REFERRING DIAG:  Diagnosis  I63.81 (ICD-10-CM) - Lacunar infarction (HCC)  R27.0 (ICD-10-CM) - Ataxia    THERAPY DIAG:  Unsteadiness on feet  Repeated falls  Other abnormalities of gait and mobility  Muscle weakness (generalized)  Rationale for Evaluation and Treatment: Rehabilitation  ONSET DATE: 10/07/23 (MD referral)  SUBJECTIVE:   SUBJECTIVE STATEMENT:   Its been hard for me to get here due to transportation (I can't drive), also due multiple MD appts and tests. Its driving me crazy. They keep finding things I'm still having to go through it all. They discovered that my kidneys aren't doing what they should.     EVAL-Balance is difficult for me. Not sure when I had the stroke, was in the ED 2x due to low blood pressures, it is up and down at home but I do check it regularly. The ED told me I needed a cane when I left.   PERTINENT HISTORY: See above  PAIN:  Are you having pain? No 0/10  PRECAUTIONS:  Fall and Other:  no vision L eye, poor vision R eye   RED FLAGS: None   WEIGHT BEARING RESTRICTIONS: No  FALLS:  Has patient fallen in last 6 months? Yes. Number of falls unsure but has had recent falls   LIVING ENVIRONMENT: Lives with: lives alone but family is very involved and assists her quite a bit  Lives in: House/apartment Stairs: No Has following equipment at home: Single point cane  OCCUPATION: retired, used to work in [employer]   PLOF: Independent, Independent with basic ADLs, Independent with gait, and Independent with transfers  PATIENT GOALS: learn how to use cane, get balance a little better   NEXT MD VISIT: 12/14/23 with referring  OBJECTIVE:  Note: Objective measures were completed at Evaluation unless otherwise noted.    PATIENT SURVEYS:  ABC scale 43.8%  COGNITION: Overall cognitive status: History of cognitive impairments - at baseline        LOWER EXTREMITY MMT:  MMT Right eval Left eval  Hip flexion 4 4  Hip extension    Hip abduction 4 seated 4 seated   Hip adduction    Hip internal rotation    Hip external rotation    Knee flexion 4+ 4+  Knee extension 4+ 4+  Ankle dorsiflexion    Ankle plantarflexion    Ankle inversion    Ankle eversion     (Blank rows = not tested)   FUNCTIONAL TESTS:   5xSTS UEs on thighs 19 seconds TUG 13.5 seconds no device 561ft minA                                                                                                                                 TREATMENT DATE:   11/15/23  Nustep L5x6 seat 6, all four extremities  Tandem stance blue foam pad 3x30 seconds B    Green TB for strengthening/HEP practice: LAQs x10 B HS curls x10 B Seated marches x10 B STS with band around thighs x10 Standing hip ABD x10 B     11/10/23  NuStep L5x58mins  LAQ 2x10 HS curls red 2x10  Ball squeeze 2x10 TUG- 11s 5xSTS- 18s  10/14/23   BP 106/66 HR 78  Eval, care planning       PATIENT EDUCATION:  Education  details: exam findings, care plan  Person educated: Patient Education method: Explanation Education comprehension: verbalized understanding and needs further education  HOME EXERCISE PROGRAM:   Access Code: [email or web address] URL: [email or web address] Date: 11/15/2023 Prepared by: Terrel Ferries  Exercises - Standing Hip Abduction with Resistance at Thighs  - 1 x daily - 4 x weekly - 1-2 sets - 10 reps - Seated Knee Extension with Anchored Resistance  - 1 x daily - 4 x weekly - 1-2 sets - 10 reps - Seated Knee Flexion with Anchored Resistance  - 1 x daily - 4 x weekly - 1-2 sets - 10 reps - Seated March with Resistance  - 1 x daily - 4 x weekly - 1-2 sets - 10 reps - Sit to Stand with Resistance Around Legs  - 1 x daily - 4 x weekly - 1-2 sets - 10 reps  ASSESSMENT:  CLINICAL IMPRESSION:  Pt arrives significantly late today. We warmed up on the Nustep, then focused on provided HEP as appropriate especially as it has been very difficult for her to come to PT appts due to transportation and other medical appointments. Unsure how much progress we can realistically expect since she has not been able to see us  very often so far.    Patient is a 78 y.o. F who was seen today for physical therapy evaluation and treatment for  Diagnosis  I63.81 (ICD-10-CM) - Lacunar infarction (HCC)  R27.0 (ICD-10-CM) - Ataxia  .  Unfortunately she was a bit of a poor historian due to known memory problems/dementia- perseverated on being shocked that medical supply store did not teach her how to use her new cane. See objectives as above. Will make every effort to improve function and reduce fall risk.   OBJECTIVE IMPAIRMENTS: Abnormal gait, decreased activity tolerance, decreased balance, decreased cognition, decreased knowledge of use of DME, decreased mobility, difficulty walking, decreased strength, and decreased safety awareness.   ACTIVITY LIMITATIONS: standing, squatting, stairs, transfers, and  locomotion level  PARTICIPATION LIMITATIONS: meal prep, cleaning, laundry, shopping, community activity, occupation, and yard work  PERSONAL FACTORS: Age, Fitness, Past/current experiences, Social background, and Time since onset of injury/illness/exacerbation are also affecting patient's functional outcome.   REHAB POTENTIAL: Good  CLINICAL DECISION MAKING: Stable/uncomplicated  EVALUATION COMPLEXITY: Low   GOALS: Goals reviewed with patient? No  SHORT TERM GOALS: Target date: 11/11/2023   Will be compliant with appropriate progressive HEP with MinA from caregivers  Baseline: Goal status: ongoing 11/10/23  2.  Will complete TUG test in 10 seconds without AD  Baseline:  Goal status: 11s ongoing 11/10/23  3.  Will demonstrate consistent correct sequencing with LRAD Baseline:  Goal status: not using AD 11/10/23  4.  Will complete 5xSTS test in 13 seconds or less without UEs  Baseline:  Goal status: 18s ongoing 11/10/23 Goes slow d/t [place]    LONG TERM GOALS: Target date: 12/09/2023    MMT to be 5/5 in all tested groups  Baseline:  Goal status: INITIAL  2.  Will ambulate >684ft in with S, no unsteadiness  Baseline:  Goal status: INITIAL  3.  Will score at least 20 on DGI to show reduced fall risk  Baseline:  Goal status: INITIAL  4.  Will demonstrate ability to ambulate on outdoor/uneven surfaces with and without LRAD to show reduced fall risk in community  Baseline:  Goal status: INITIAL  5.  ABC score to improve by 20% Baseline:  Goal status: INITIAL    PLAN:  PT FREQUENCY: 1-2x/week  PT DURATION: 8 weeks  PLANNED INTERVENTIONS: 97750- Physical Performance Testing, 97110-Therapeutic exercises, 97530- Therapeutic activity, [id number]- Neuromuscular re-education, 97535- Self Care, 16109- Manual therapy, and 97116- Gait training  PLAN FOR NEXT SESSION: Keep it simple and functional for her. Strength, conditioning, balance. Review HEP given 3rd session    Terrel Ferries, PT, DPT 11/15/23 3:59 PM

## 2023-11-18 NOTE — Therapy (Incomplete)
 OUTPATIENT PHYSICAL THERAPY TREATMENT   Patient Name: Candace Hill MRN: [id number] DOB:1946-02-02, 78 y.o., female Today's Date: 11/18/2023  END OF SESSION:      Past Medical History:  Diagnosis Date   Abdominal pain    Acute cystitis without hematuria 03/12/2022   Allergic rhinitis 11/14/2012   Amblyopia of eye, left 06/17/2017   Asbestos exposure 04/08/2018   Asthma    4098-1191 due to black mold house, no asthma now   Atherosclerosis of aorta 01/28/2023   Broken ankle    right   Broken rib    x2   Cataract    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 03/26/2011   Chronic hepatitis C virus infection 05/31/2013   Complication of anesthesia    IV phenergan   serious mental reaction   Delusional thoughts 03/18/2022   Diarrhea 03/13/2022   Dysuria 07/29/2023   Essential hypertension 10/14/2017   Gastroesophageal reflux disease 10/14/2017   Generalized anxiety disorder    Hernia, femoral    left   History of blood transfusion 1971   acquired hepatitis c   History of kidney stones    History of liver transplant 11/14/2012   History of nephrolithiasis 10/14/2017   History of shingles    Hyperglycemia 04/08/2018   Hyperlipidemia 01/15/2021   The 10-year ASCVD risk score (Arnett DK, et al., 2019) is: 16.2%    Values used to calculate the score:      Age: 52 years      Sex: Female      Is Non-Hispanic African American: No      Diabetic: No      Tobacco smoker: No      Systolic Blood Pressure: 136 mmHg      Is BP treated: No      HDL Cholesterol: 51.2 mg/dL      Total Cholesterol: 202 mg/dL     Intrinsic eczema 47/82/9562   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   Major depressive disorder 11/14/2012   Malignant neoplasm of liver 11/14/2012   Migraine 07/02/2015   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   vascular/medical; concerns for concurrent Alzheimer's disease   Overweight (BMI 25.0-29.9) 06/28/2015   PCO (posterior capsular  opacification), right 03/26/2011   Seasonal affective disorder    Splenic artery aneurysm    Stage 4 chronic kidney disease 07/29/2023   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-will get Avastin  injections off/on   Vitamin D  deficiency 04/08/2018   Weight loss, non-intentional 03/12/2022   Past Surgical History:  Procedure Laterality Date   CATARACT EXTRACTION Bilateral    CHOLECYSTECTOMY  1997   CYSTOSCOPY W/ URETERAL STENT PLACEMENT Left 08/14/2016   Procedure: CYSTOSCOPY WITH RETROGRADE PYELOGRAM/URETERAL LEFT URETEROSCOPY AND LEFT STENT PLACEMENT;  Surgeon: Florencio Hunting, MD;  Location: [facility];  Service: Urology;  Laterality: Left;   CYSTOSCOPY/URETEROSCOPY/HOLMIUM LASER/STENT PLACEMENT Left 07/06/2016   Procedure: CYSTOSCOPY/URETEROSCOPY/ BILATERAL RETROGRADE/HOLMIUM LASER/STENT PLACEMENT/BASKET STONE REMOVAL;  Surgeon: Florencio Hunting, MD;  Location: [facility];  Service: Urology;  Laterality: Left;   CYSTOSCOPY/URETEROSCOPY/HOLMIUM LASER/STENT PLACEMENT Left 08/06/2016   Procedure: CYSTOSCOPY/URETEROSCOPY/HOLMIUM LASER/STENT PLACEMENT;  Surgeon: Florencio Hunting, MD;  Location: [facility];  Service: Urology;  Laterality: Left;   CYSTOSCOPY/URETEROSCOPY/HOLMIUM LASER/STENT PLACEMENT Left 08/31/2016   Procedure: CYSTOSCOPY/URETEROSCOPY/HOLMIUM LASER/STENT PLACEMENT/BASKET STONE REMOVAL;  Surgeon: Florencio Hunting, MD;  Location: [facility];  Service: Urology;  Laterality:  Left;   FEMORAL HERNIA REPAIR Left 1997   left leg-   LIVER TRANSPLANTATION     NASAL SINUS SURGERY  1997   x3   TONSILLECTOMY  age 61   TUBAL LIGATION     Patient Active Problem List   Diagnosis Date Noted   Iron deficiency anemia 10/25/2023   Ataxia 10/07/2023   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   Stage 4 chronic kidney disease 07/29/2023   Need for varicella vaccine 01/28/2023   Atherosclerosis of aorta 01/28/2023   Splenic artery aneurysm  03/18/2022   Lacunar infarction 03/18/2022   Generalized anxiety disorder    Intrinsic eczema 01/15/2021   Hyperlipidemia 01/15/2021   Vitamin D  deficiency 04/08/2018   Essential hypertension 10/14/2017   Gastroesophageal reflux disease 10/14/2017   Major depressive disorder 11/14/2012   Allergic rhinitis 11/14/2012   History of liver transplant 11/14/2012   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 03/26/2011   PCO (posterior capsular opacification), right 03/26/2011    PCP: Arcadio Knuckles, MD  REFERRING PROVIDER: Arcadio Knuckles, MD  REFERRING DIAG:  Diagnosis  I63.81 (ICD-10-CM) - Lacunar infarction (HCC)  R27.0 (ICD-10-CM) - Ataxia    THERAPY DIAG:  No diagnosis found.  Rationale for Evaluation and Treatment: Rehabilitation  ONSET DATE: 10/07/23 (MD referral)  SUBJECTIVE:   SUBJECTIVE STATEMENT:   Its been hard for me to get here due to transportation (I can't drive), also due multiple MD appts and tests. Its driving me crazy. They keep finding things I'm still having to go through it all. They discovered that my kidneys aren't doing what they should.     EVAL-Balance is difficult for me. Not sure when I had the stroke, was in the ED 2x due to low blood pressures, it is up and down at home but I do check it regularly. The ED told me I needed a cane when I left.   PERTINENT HISTORY: See above  PAIN:  Are you having pain? No 0/10  PRECAUTIONS:  Fall and Other: no vision L eye, poor vision R eye   RED FLAGS: None   WEIGHT BEARING RESTRICTIONS: No  FALLS:  Has patient fallen in last 6 months? Yes. Number of falls unsure but has had recent falls   LIVING ENVIRONMENT: Lives with: lives alone but family is very involved and assists her quite a bit  Lives in: House/apartment Stairs: No Has following equipment at home: Single point cane  OCCUPATION: retired, used to work in [employer] pilot   PLOF: Independent, Independent with basic ADLs,  Independent with gait, and Independent with transfers  PATIENT GOALS: learn how to use cane, get balance a little better   NEXT MD VISIT: 12/14/23 with referring  OBJECTIVE:  Note: Objective measures were completed at Evaluation unless otherwise noted.    PATIENT SURVEYS:  ABC scale 43.8%  COGNITION: Overall cognitive status: History of cognitive impairments - at baseline        LOWER EXTREMITY MMT:  MMT Right eval Left eval  Hip flexion 4 4  Hip extension    Hip abduction 4 seated 4 seated   Hip adduction    Hip internal rotation    Hip external rotation    Knee flexion 4+ 4+  Knee extension 4+ 4+  Ankle dorsiflexion    Ankle plantarflexion    Ankle inversion    Ankle eversion     (Blank rows = not tested)   FUNCTIONAL TESTS:   5xSTS UEs on thighs 19  seconds TUG 13.5 seconds no device 510ft minA                                                                                                                                 TREATMENT DATE:  11/19/23 Review HEP NuStep Step ups  2 way hip 3#  3# marching Walking on beam   11/15/23  Nustep L5x6 seat 6, all four extremities  Tandem stance blue foam pad 3x30 seconds B    Green TB for strengthening/HEP practice: LAQs x10 B HS curls x10 B Seated marches x10 B STS with band around thighs x10 Standing hip ABD x10 B     11/10/23  NuStep L5x59mins  LAQ 2x10 HS curls red 2x10  Ball squeeze 2x10 TUG- 11s 5xSTS- 18s  10/14/23   BP 106/66 HR 78  Eval, care planning       PATIENT EDUCATION:  Education details: exam findings, care plan  Person educated: Patient Education method: Explanation Education comprehension: verbalized understanding and needs further education  HOME EXERCISE PROGRAM:   Access Code: [email or web address] URL: [email or web address] Date: 11/15/2023 Prepared by: Terrel Ferries  Exercises - Standing Hip Abduction with Resistance at Thighs  - 1 x daily - 4 x weekly -  1-2 sets - 10 reps - Seated Knee Extension with Anchored Resistance  - 1 x daily - 4 x weekly - 1-2 sets - 10 reps - Seated Knee Flexion with Anchored Resistance  - 1 x daily - 4 x weekly - 1-2 sets - 10 reps - Seated March with Resistance  - 1 x daily - 4 x weekly - 1-2 sets - 10 reps - Sit to Stand with Resistance Around Legs  - 1 x daily - 4 x weekly - 1-2 sets - 10 reps  ASSESSMENT:  CLINICAL IMPRESSION:  Pt arrives significantly late today. We warmed up on the Nustep, then focused on provided HEP as appropriate especially as it has been very difficult for her to come to PT appts due to transportation and other medical appointments. Unsure how much progress we can realistically expect since she has not been able to see us  very often so far.    Patient is a 78 y.o. F who was seen today for physical therapy evaluation and treatment for  Diagnosis  I63.81 (ICD-10-CM) - Lacunar infarction (HCC)  R27.0 (ICD-10-CM) - Ataxia  . Unfortunately she was a bit of a poor historian due to known memory problems/dementia- perseverated on being shocked that medical supply store did not teach her how to use her new cane. See objectives as above. Will make every effort to improve function and reduce fall risk.   OBJECTIVE IMPAIRMENTS: Abnormal gait, decreased activity tolerance, decreased balance, decreased cognition, decreased knowledge of use of DME, decreased mobility, difficulty walking, decreased strength, and decreased safety awareness.   ACTIVITY LIMITATIONS: standing, squatting, stairs, transfers, and locomotion level  PARTICIPATION LIMITATIONS: meal prep, cleaning, laundry, shopping, community  activity, occupation, and yard work  PERSONAL FACTORS: Age, Fitness, Past/current experiences, Social background, and Time since onset of injury/illness/exacerbation are also affecting patient's functional outcome.   REHAB POTENTIAL: Good  CLINICAL DECISION MAKING: Stable/uncomplicated  EVALUATION  COMPLEXITY: Low   GOALS: Goals reviewed with patient? No  SHORT TERM GOALS: Target date: 11/11/2023   Will be compliant with appropriate progressive HEP with MinA from caregivers  Baseline: Goal status: ongoing 11/10/23  2.  Will complete TUG test in 10 seconds without AD  Baseline:  Goal status: 11s ongoing 11/10/23  3.  Will demonstrate consistent correct sequencing with LRAD Baseline:  Goal status: not using AD 11/10/23  4.  Will complete 5xSTS test in 13 seconds or less without UEs  Baseline:  Goal status: 18s ongoing 11/10/23 Goes slow d/t [place]    LONG TERM GOALS: Target date: 12/09/2023    MMT to be 5/5 in all tested groups  Baseline:  Goal status: INITIAL  2.  Will ambulate >634ft in with S, no unsteadiness  Baseline:  Goal status: INITIAL  3.  Will score at least 20 on DGI to show reduced fall risk  Baseline:  Goal status: INITIAL  4.  Will demonstrate ability to ambulate on outdoor/uneven surfaces with and without LRAD to show reduced fall risk in community  Baseline:  Goal status: INITIAL  5.  ABC score to improve by 20% Baseline:  Goal status: INITIAL    PLAN:  PT FREQUENCY: 1-2x/week  PT DURATION: 8 weeks  PLANNED INTERVENTIONS: 97750- Physical Performance Testing, 97110-Therapeutic exercises, 97530- Therapeutic activity, [id number]- Neuromuscular re-education, 97535- Self Care, 65784- Manual therapy, and 97116- Gait training  PLAN FOR NEXT SESSION: Keep it simple and functional for her. Strength, conditioning, balance. Review HEP given 3rd session   Terrel Ferries, PT, DPT 11/18/23 5:45 PM

## 2023-11-19 ENCOUNTER — Ambulatory Visit

## 2023-11-19 ENCOUNTER — Ambulatory Visit (HOSPITAL_BASED_OUTPATIENT_CLINIC_OR_DEPARTMENT_OTHER)

## 2023-11-19 DIAGNOSIS — I728 Aneurysm of other specified arteries: Secondary | ICD-10-CM

## 2023-11-19 DIAGNOSIS — I10 Essential (primary) hypertension: Secondary | ICD-10-CM | POA: Diagnosis not present

## 2023-11-19 DIAGNOSIS — R0609 Other forms of dyspnea: Secondary | ICD-10-CM

## 2023-11-19 DIAGNOSIS — I700 Atherosclerosis of aorta: Secondary | ICD-10-CM | POA: Diagnosis not present

## 2023-11-19 DIAGNOSIS — F0150 Vascular dementia without behavioral disturbance: Secondary | ICD-10-CM

## 2023-11-19 DIAGNOSIS — E782 Mixed hyperlipidemia: Secondary | ICD-10-CM

## 2023-11-19 DIAGNOSIS — Z944 Liver transplant status: Secondary | ICD-10-CM

## 2023-11-21 LAB — ECHOCARDIOGRAM COMPLETE
AR max vel: 2.63 cm2
AV Area VTI: 2.84 cm2
AV Area mean vel: 2.62 cm2
AV Mean grad: 2 mmHg
AV Peak grad: 3.8 mmHg
Ao pk vel: 0.98 m/s
Area-P 1/2: 3.06 cm2
S' Lateral: 1.81 cm

## 2023-11-22 DIAGNOSIS — Z944 Liver transplant status: Principal | ICD-10-CM

## 2023-11-22 DIAGNOSIS — Z5181 Encounter for therapeutic drug level monitoring: Principal | ICD-10-CM

## 2023-11-22 DIAGNOSIS — E612 Magnesium deficiency: Principal | ICD-10-CM

## 2023-11-29 ENCOUNTER — Ambulatory Visit: Admitting: Physical Therapy

## 2023-11-29 DIAGNOSIS — Z5181 Encounter for therapeutic drug level monitoring: Principal | ICD-10-CM

## 2023-11-29 DIAGNOSIS — E612 Magnesium deficiency: Principal | ICD-10-CM

## 2023-11-29 DIAGNOSIS — Z944 Liver transplant status: Principal | ICD-10-CM

## 2023-12-01 ENCOUNTER — Telehealth: Payer: Self-pay

## 2023-12-01 ENCOUNTER — Ambulatory Visit: Admitting: Physician Assistant

## 2023-12-01 NOTE — Telephone Encounter (Signed)
 Pt viewed Echo results on My Chart per Dr. Vanetta Shawl note. Routed to PCP.

## 2023-12-02 ENCOUNTER — Encounter: Payer: Self-pay | Admitting: Physical Therapy

## 2023-12-02 ENCOUNTER — Ambulatory Visit: Admitting: Physical Therapy

## 2023-12-02 DIAGNOSIS — R2689 Other abnormalities of gait and mobility: Secondary | ICD-10-CM

## 2023-12-02 DIAGNOSIS — M6281 Muscle weakness (generalized): Secondary | ICD-10-CM

## 2023-12-02 DIAGNOSIS — R296 Repeated falls: Secondary | ICD-10-CM

## 2023-12-02 DIAGNOSIS — R2681 Unsteadiness on feet: Secondary | ICD-10-CM

## 2023-12-02 NOTE — Therapy (Signed)
 OUTPATIENT PHYSICAL THERAPY DISCHARGE    Patient Name: Candace Hill MRN: [id number] DOB:1945/07/15, 78 y.o., female Today's Date: 12/02/2023  PHYSICAL THERAPY DISCHARGE SUMMARY  Visits from Start of Care: 4  Current functional level related to goals / functional outcomes: See below    Remaining deficits: See below    Education / Equipment: See below    Patient agrees to discharge. Patient goals were partially met. Patient is being discharged due to lack of progress.   END OF SESSION:  PT End of Session - 12/02/23 1421     Visit Number 4    Number of Visits 17    Authorization Type [employer] MCR    Authorization Time Period 10/14/23 to 12/23/23    Progress Note Due on Visit 10    PT Start Time 1348    PT Stop Time 1428    PT Time Calculation (min) 40 min    Activity Tolerance Patient tolerated treatment well    Behavior During Therapy [facility] for tasks assessed/performed             Past Medical History:  Diagnosis Date   Abdominal pain    Acute cystitis without hematuria 03/12/2022   Allergic rhinitis 11/14/2012   Amblyopia of eye, left 06/17/2017   Asbestos exposure 04/08/2018   Asthma    1994-1994 due to black mold house, no asthma now   Atherosclerosis of aorta 01/28/2023   Broken ankle    right   Broken rib    x2   Cataract    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 03/26/2011   Chronic hepatitis C virus infection 05/31/2013   Complication of anesthesia    IV phenergan   serious mental reaction   Delusional thoughts 03/18/2022   Diarrhea 03/13/2022   Dysuria 07/29/2023   Essential hypertension 10/14/2017   Gastroesophageal reflux disease 10/14/2017   Generalized anxiety disorder    Hernia, femoral    left   History of blood transfusion 1971   acquired hepatitis c   History of kidney stones    History of liver transplant 11/14/2012   History of nephrolithiasis 10/14/2017   History of shingles    Hyperglycemia 04/08/2018    Hyperlipidemia 01/15/2021   The 10-year ASCVD risk score (Arnett DK, et al., 2019) is: 16.2%    Values used to calculate the score:      Age: 46 years      Sex: Female      Is Non-Hispanic African American: No      Diabetic: No      Tobacco smoker: No      Systolic Blood Pressure: 136 mmHg      Is BP treated: No      HDL Cholesterol: 51.2 mg/dL      Total Cholesterol: 202 mg/dL     Intrinsic eczema 38/75/6433   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   Major depressive disorder 11/14/2012   Malignant neoplasm of liver 11/14/2012   Migraine 07/02/2015   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   vascular/medical; concerns for concurrent Alzheimer's disease   Overweight (BMI 25.0-29.9) 06/28/2015   PCO (posterior capsular opacification), right 03/26/2011   Seasonal affective disorder    Splenic artery aneurysm    Stage 4 chronic kidney disease 07/29/2023   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-will get Avastin  injections off/on  Vitamin D  deficiency 04/08/2018   Weight loss, non-intentional 03/12/2022   Past Surgical History:  Procedure Laterality Date   CATARACT EXTRACTION Bilateral    CHOLECYSTECTOMY  1997   CYSTOSCOPY W/ URETERAL STENT PLACEMENT Left 08/14/2016   Procedure: CYSTOSCOPY WITH RETROGRADE PYELOGRAM/URETERAL LEFT URETEROSCOPY AND LEFT STENT PLACEMENT;  Surgeon: Florencio Hunting, MD;  Location: [facility];  Service: Urology;  Laterality: Left;   CYSTOSCOPY/URETEROSCOPY/HOLMIUM LASER/STENT PLACEMENT Left 07/06/2016   Procedure: CYSTOSCOPY/URETEROSCOPY/ BILATERAL RETROGRADE/HOLMIUM LASER/STENT PLACEMENT/BASKET STONE REMOVAL;  Surgeon: Florencio Hunting, MD;  Location: [facility];  Service: Urology;  Laterality: Left;   CYSTOSCOPY/URETEROSCOPY/HOLMIUM LASER/STENT PLACEMENT Left 08/06/2016   Procedure: CYSTOSCOPY/URETEROSCOPY/HOLMIUM LASER/STENT PLACEMENT;  Surgeon: Florencio Hunting, MD;  Location: [facility];   Service: Urology;  Laterality: Left;   CYSTOSCOPY/URETEROSCOPY/HOLMIUM LASER/STENT PLACEMENT Left 08/31/2016   Procedure: CYSTOSCOPY/URETEROSCOPY/HOLMIUM LASER/STENT PLACEMENT/BASKET STONE REMOVAL;  Surgeon: Florencio Hunting, MD;  Location: [facility];  Service: Urology;  Laterality: Left;   FEMORAL HERNIA REPAIR Left 1997   left leg-   LIVER TRANSPLANTATION     NASAL SINUS SURGERY  1997   x3   TONSILLECTOMY  age 53   TUBAL LIGATION     Patient Active Problem List   Diagnosis Date Noted   Iron deficiency anemia 10/25/2023   Ataxia 10/07/2023   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   Stage 4 chronic kidney disease 07/29/2023   Need for varicella vaccine 01/28/2023   Atherosclerosis of aorta 01/28/2023   Splenic artery aneurysm 03/18/2022   Lacunar infarction 03/18/2022   Generalized anxiety disorder    Intrinsic eczema 01/15/2021   Hyperlipidemia 01/15/2021   Vitamin D  deficiency 04/08/2018   Essential hypertension 10/14/2017   Gastroesophageal reflux disease 10/14/2017   Major depressive disorder 11/14/2012   Allergic rhinitis 11/14/2012   History of liver transplant 11/14/2012   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 03/26/2011   PCO (posterior capsular opacification), right 03/26/2011    PCP: Arcadio Knuckles, MD  REFERRING PROVIDER: Arcadio Knuckles, MD  REFERRING DIAG:  Diagnosis  I63.81 (ICD-10-CM) - Lacunar infarction (HCC)  R27.0 (ICD-10-CM) - Ataxia    THERAPY DIAG:  Unsteadiness on feet  Repeated falls  Other abnormalities of gait and mobility  Muscle weakness (generalized)  Rationale for Evaluation and Treatment: Rehabilitation  ONSET DATE: 10/07/23 (MD referral)  SUBJECTIVE:   SUBJECTIVE STATEMENT:  I'm all shaken because of the news, can you believe [place] and [place]?  I can't tell you why I haven't been here, I'm not supposed to go outside because its hilly and getting hot. BP is up and down     EVAL-Balance is difficult for me. Not sure when  I had the stroke, was in the ED 2x due to low blood pressures, it is up and down at home but I do check it regularly. The ED told me I needed a cane when I left.   PERTINENT HISTORY: See above  PAIN:  Are you having pain? Yes having pain today but no number given NPRS   PRECAUTIONS:  Fall and Other: no vision L eye, poor vision R eye   RED FLAGS: None   WEIGHT BEARING RESTRICTIONS: No  FALLS:  Has patient fallen in last 6 months? Yes. Number of falls unsure but has had recent falls   LIVING ENVIRONMENT: Lives with: lives alone but family is very involved and assists her quite a bit  Lives in: House/apartment Stairs: No Has following equipment at home: Single point cane  OCCUPATION: retired, used to work in  sales   PLOF: Independent, Independent with basic ADLs, Independent with gait, and Independent with transfers  PATIENT GOALS: learn how to use cane, get balance a little better   NEXT MD VISIT: 12/14/23 with referring  OBJECTIVE:  Note: Objective measures were completed at Evaluation unless otherwise noted.    PATIENT SURVEYS:  ABC scale 43.8%  COGNITION: Overall cognitive status: History of cognitive impairments - at baseline        LOWER EXTREMITY MMT:  MMT Right eval Left eval Right 12/02/23 Left 12/02/23  Hip flexion 4 4 4+ 4+  Hip extension      Hip abduction 4 seated 4 seated  4 seated  4 seated   Hip adduction      Hip internal rotation      Hip external rotation      Knee flexion 4+ 4+ 4+ 4+  Knee extension 4+ 4+ 4+ 4+  Ankle dorsiflexion      Ankle plantarflexion      Ankle inversion      Ankle eversion       (Blank rows = not tested)   FUNCTIONAL TESTS:   5xSTS UEs on thighs 19 seconds; 12/02/23- 13 seconds  TUG 13.5 seconds no device; 12/02/23- 7 seconds  569ft minA     12/02/23 0001  Standardized Balance Assessment  Standardized Balance Assessment Dynamic Gait Index  Dynamic Gait Index  Level Surface 3  Change in Gait Speed  3  Gait with Horizontal Head Turns 3  Gait with Vertical Head Turns 2  Gait and Pivot Turn 3  Step Over Obstacle 3  Step Around Obstacles 2  Steps 2  Total Score 21                                                                                                                                TREATMENT DATE:   12/02/23   Bike L4 x8 minutes for w/u prior to testing BP WNL 116/77  MMT  TUG  5xSTS  DGI   Education on progress with PT and how she would likely benefit from HHPT as this will be more consistent skilled care, process of getting HHPT referral      11/15/23  Nustep L5x6 seat 6, all four extremities  Tandem stance blue foam pad 3x30 seconds B    Green TB for strengthening/HEP practice: LAQs x10 B HS curls x10 B Seated marches x10 B STS with band around thighs x10 Standing hip ABD x10 B     11/10/23  NuStep L5x43mins  LAQ 2x10 HS curls red 2x10  Ball squeeze 2x10 TUG- 11s 5xSTS- 18s  10/14/23   BP 106/66 HR 78  Eval, care planning       PATIENT EDUCATION:  Education details: exam findings, care plan  Person educated: Patient Education method: Explanation Education comprehension: verbalized understanding and needs further education  HOME EXERCISE PROGRAM:   Access Code: [email or web address] URL: [email or web address] Date:  11/15/2023 Prepared by: Terrel Ferries  Exercises - Standing Hip Abduction with Resistance at Thighs  - 1 x daily - 4 x weekly - 1-2 sets - 10 reps - Seated Knee Extension with Anchored Resistance  - 1 x daily - 4 x weekly - 1-2 sets - 10 reps - Seated Knee Flexion with Anchored Resistance  - 1 x daily - 4 x weekly - 1-2 sets - 10 reps - Seated March with Resistance  - 1 x daily - 4 x weekly - 1-2 sets - 10 reps - Sit to Stand with Resistance Around Legs  - 1 x daily - 4 x weekly - 1-2 sets - 10 reps  ASSESSMENT:  CLINICAL IMPRESSION:  Warmed up and then focused on updating objective measures today. She has only  attended 3 treatment sessions in PT since May 1st including today. Cognition seemed a little more off than in past visits. Attempted ABC but she was unable to give numerical ratings today. She may benefit from HHPT more than skilled OP PT services as it has been very difficult for her to come to sessions, she is reliant on others for transportation. Discussed this with grand-daughter, family is OK with transition.    Patient is a 78 y.o. F who was seen today for physical therapy evaluation and treatment for  Diagnosis  I63.81 (ICD-10-CM) - Lacunar infarction (HCC)  R27.0 (ICD-10-CM) - Ataxia  . Unfortunately she was a bit of a poor historian due to known memory problems/dementia- perseverated on being shocked that medical supply store did not teach her how to use her new cane. See objectives as above. Will make every effort to improve function and reduce fall risk.   OBJECTIVE IMPAIRMENTS: Abnormal gait, decreased activity tolerance, decreased balance, decreased cognition, decreased knowledge of use of DME, decreased mobility, difficulty walking, decreased strength, and decreased safety awareness.   ACTIVITY LIMITATIONS: standing, squatting, stairs, transfers, and locomotion level  PARTICIPATION LIMITATIONS: meal prep, cleaning, laundry, shopping, community activity, occupation, and yard work  PERSONAL FACTORS: Age, Fitness, Past/current experiences, Social background, and Time since onset of injury/illness/exacerbation are also affecting patient's functional outcome.   REHAB POTENTIAL: Good  CLINICAL DECISION MAKING: Stable/uncomplicated  EVALUATION COMPLEXITY: Low   GOALS: Goals reviewed with patient? No  SHORT TERM GOALS: Target date: 11/11/2023   Will be compliant with appropriate progressive HEP with MinA from caregivers  Baseline: Goal status: ONGOING 12/02/23  2.  Will complete TUG test in 10 seconds without AD  Baseline:  Goal status: MET 12/02/23  3.  Will demonstrate  consistent correct sequencing with LRAD Baseline:  Goal status: DEFERRED 12/02/23  4.  Will complete 5xSTS test in 13 seconds or less without UEs  Baseline:  Goal status: MET 12/02/23   LONG TERM GOALS: Target date: 12/09/2023    MMT to be 5/5 in all tested groups  Baseline:  Goal status: ONGOING 12/02/23  2.  Will ambulate >673ft in with S, no unsteadiness  Baseline:  Goal status: DEFERRED 12/02/23  3.  Will score at least 20 on DGI to show reduced fall risk  Baseline:  Goal status: ONGOING 12/02/23  4.  Will demonstrate ability to ambulate on outdoor/uneven surfaces with and without LRAD to show reduced fall risk in community  Baseline:  Goal status: PARTIALLY MET 12/02/23  5.  ABC score to improve by 20% Baseline:  Goal status: DEFERRED 12/02/23    PLAN:  PT FREQUENCY: 1-2x/week  PT DURATION: 8 weeks  PLANNED INTERVENTIONS: 97750-  Physical Performance Testing, 97110-Therapeutic exercises, 97530- Therapeutic activity, [id number]- Neuromuscular re-education, 97535- Self Care, 16109- Manual therapy, and [phone]- Gait training  PLAN FOR NEXT SESSION: DC today, transition to HHPT   Terrel Ferries, PT, DPT 12/02/23 2:53 PM

## 2023-12-06 ENCOUNTER — Other Ambulatory Visit (HOSPITAL_BASED_OUTPATIENT_CLINIC_OR_DEPARTMENT_OTHER)

## 2023-12-06 DIAGNOSIS — Z5181 Encounter for therapeutic drug level monitoring: Principal | ICD-10-CM

## 2023-12-06 DIAGNOSIS — E612 Magnesium deficiency: Principal | ICD-10-CM

## 2023-12-06 DIAGNOSIS — Z944 Liver transplant status: Principal | ICD-10-CM

## 2023-12-07 ENCOUNTER — Telehealth: Payer: Self-pay

## 2023-12-07 NOTE — Telephone Encounter (Signed)
 Left message on My Chart with CT score results per Dr. Vanetta Shawl note. Routed to PCP.

## 2023-12-10 NOTE — Unmapped (Signed)
 [facility] Refill Coordination Note    Specialty Medication(s) to be Shipped:   Transplant: tacrolimus  0.5 mg    Other medication(s) to be shipped: No additional medications requested for fill at this time     Kristy [facility], DOB: Nov 04, 1945  Phone: [phone] (home)       All above HIPAA information was verified with patient.     Was a Nurse, learning disability used for this call? No    Completed refill call assessment today to schedule patient's medication shipment from the [facility]  [phone]).  All relevant notes have been reviewed.     Specialty medication(s) and dose(s) confirmed: Regimen is correct and unchanged.   Changes to medications: Kristy [facility]on reports no changes at this time.  Changes to insurance: No  New side effects reported not previously addressed with a pharmacist or physician: None reported  Questions for the pharmacist: No    Confirmed patient received a Conservation officer, historic buildings and a Surveyor, mining with first shipment. The patient will receive a drug information handout for each medication shipped and additional FDA Medication Guides as required.       DISEASE/MEDICATION-SPECIFIC INFORMATION        N/A    SPECIALTY MEDICATION ADHERENCE     Medication Adherence    Patient reported X missed doses in the last month: 0  Specialty Medication: tacrolimus  0.5 MG capsule (PROGRAF )  Patient is on additional specialty medications: No              Were doses missed due to medication being on hold? No    tacrolimus  0.5 MG capsule (PROGRAF )  5 days of medicine on hand     REFERRAL TO PHARMACIST     Referral to the pharmacist: Not needed      [facility]     Shipping address confirmed in Epic.     Cost and Payment: Patient has a copay of $47.00 . They are aware and have authorized the [facility] to charge the credit card on file.    Delivery Scheduled: Yes, Expected medication delivery date: 12/14/2023.     Medication will be delivered via UPS to the prescription address in Epic WAM.    [facility]  Specialty Technician

## 2023-12-13 ENCOUNTER — Telehealth: Payer: Self-pay

## 2023-12-13 DIAGNOSIS — Z5181 Encounter for therapeutic drug level monitoring: Principal | ICD-10-CM

## 2023-12-13 DIAGNOSIS — E612 Magnesium deficiency: Principal | ICD-10-CM

## 2023-12-13 DIAGNOSIS — Z944 Liver transplant status: Principal | ICD-10-CM

## 2023-12-13 MED FILL — TACROLIMUS 0.5 MG CAPSULE, IMMEDIATE-RELEASE: ORAL | 30 days supply | Qty: 180 | Fill #1

## 2023-12-13 NOTE — Telephone Encounter (Signed)
 Pt viewed Calcium Score results on My Chart per Dr. Vanetta Shawl note. Routed to PCP.

## 2023-12-14 ENCOUNTER — Ambulatory Visit: Admitting: Internal Medicine
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
## 2023-12-16 ENCOUNTER — Other Ambulatory Visit (INDEPENDENT_AMBULATORY_CARE_PROVIDER_SITE_OTHER)

## 2023-12-16 ENCOUNTER — Ambulatory Visit (HOSPITAL_COMMUNITY): Admitting: Psychiatry

## 2023-12-16 VITALS — BP 123/65

## 2023-12-16 DIAGNOSIS — Z79899 Other long term (current) drug therapy: Secondary | ICD-10-CM

## 2023-12-16 DIAGNOSIS — F331 Major depressive disorder, recurrent, moderate: Secondary | ICD-10-CM

## 2023-12-16 DIAGNOSIS — F411 Generalized anxiety disorder: Secondary | ICD-10-CM

## 2023-12-16 DIAGNOSIS — Z5181 Encounter for therapeutic drug level monitoring: Secondary | ICD-10-CM

## 2023-12-16 MED ORDER — SERTRALINE HCL 100 MG PO TABS: 100.0000 mg | ORAL_TABLET | Freq: Every day | ORAL | 1 refills | Status: DC

## 2023-12-16 MED ORDER — SERTRALINE HCL 50 MG PO TABS: 50.0000 mg | ORAL_TABLET | Freq: Every day | ORAL | 1 refills | Status: DC

## 2023-12-16 MED ORDER — LAMOTRIGINE 25 MG PO TABS: 25.0000 mg | ORAL_TABLET | Freq: Every day | ORAL | 1 refills | Status: DC

## 2023-12-16 MED ORDER — SERTRALINE HCL 25 MG PO TABS: 25.0000 mg | ORAL_TABLET | Freq: Every day | ORAL | 1 refills | Status: DC

## 2023-12-16 NOTE — Addendum Note (Signed)
 Addended by: WADDELL ELOUISE GAILS on: 12/16/2023 04:00 PM   Modules accepted: Orders

## 2023-12-16 NOTE — Progress Notes (Signed)
 Patient ID: Candace Hill, female   DOB: [birth year], 78 y.o.   MRN: [id number] Patient in today after evaluation with Dr. Izella for ordered labs, Folate, Vitamin B12, and Vitamin D .  Labs collected as ordered and prepared to be sent out to [facility] for resulting.  Patient tolerated lab draw from her right hand without report of any complaint  of pain or discomfort.

## 2023-12-20 DIAGNOSIS — Z944 Liver transplant status: Principal | ICD-10-CM

## 2023-12-20 DIAGNOSIS — Z5181 Encounter for therapeutic drug level monitoring: Principal | ICD-10-CM

## 2023-12-20 DIAGNOSIS — E612 Magnesium deficiency: Principal | ICD-10-CM

## 2023-12-21 LAB — VITAMIN D 25 HYDROXY (VIT D DEFICIENCY, FRACTURES): Vit D, 25-Hydroxy: 13.4 ng/mL — ABNORMAL LOW (ref 30.0–100.0)

## 2023-12-21 LAB — VITAMIN B12: Vitamin B-12: 349 pg/mL (ref 232–1245)

## 2023-12-21 LAB — FOLATE: Folate: 7.3 ng/mL (ref >3.0)

## 2023-12-23 ENCOUNTER — Ambulatory Visit: Payer: Self-pay | Admitting: Internal Medicine
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
## 2023-12-26 NOTE — Progress Notes (Unsigned)
 ff

## 2023-12-27 ENCOUNTER — Ambulatory Visit (HOSPITAL_COMMUNITY): Admitting: Psychiatry

## 2023-12-27 VITALS — BP 112/66

## 2023-12-27 DIAGNOSIS — F331 Major depressive disorder, recurrent, moderate: Secondary | ICD-10-CM | POA: Diagnosis not present

## 2023-12-27 DIAGNOSIS — E559 Vitamin D deficiency, unspecified: Secondary | ICD-10-CM | POA: Diagnosis not present

## 2023-12-27 DIAGNOSIS — F411 Generalized anxiety disorder: Secondary | ICD-10-CM | POA: Diagnosis not present

## 2023-12-27 DIAGNOSIS — E612 Magnesium deficiency: Principal | ICD-10-CM

## 2023-12-27 DIAGNOSIS — Z5181 Encounter for therapeutic drug level monitoring: Principal | ICD-10-CM

## 2023-12-27 DIAGNOSIS — Z944 Liver transplant status: Principal | ICD-10-CM

## 2023-12-28 ENCOUNTER — Other Ambulatory Visit: Payer: Self-pay | Admitting: Internal Medicine

## 2023-12-28 MED ORDER — VITAMIN D3 25 MCG (1000 UT) PO CAPS: 1000.0000 [IU] | ORAL_CAPSULE | Freq: Every day | ORAL | 0 refills | Status: AC

## 2023-12-30 ENCOUNTER — Ambulatory Visit: Payer: Medicare HMO

## 2023-12-30 VITALS — Ht 63.0 in | Wt 125.0 lb

## 2023-12-30 DIAGNOSIS — Z0000 Encounter for general adult medical examination without abnormal findings: Secondary | ICD-10-CM | POA: Diagnosis not present

## 2023-12-30 NOTE — Progress Notes (Signed)
 Subjective:   Candace Hill is a 78 y.o. who presents for a Medicare Wellness preventive visit.  As a reminder, Annual Wellness Visits don't include a physical exam, and some assessments may be limited, especially if this visit is performed virtually. We may recommend an in-person follow-up visit with your provider if needed.  Visit Complete: Virtual I connected with  Candace Hill on 12/30/23 by a audio enabled telemedicine application and verified that I am speaking with the correct person using two identifiers.  Patient Location: Home  Provider Location: Office/Clinic  I discussed the limitations of evaluation and management by telemedicine. The patient expressed understanding and agreed to proceed.  Vital Signs: Because this visit was a virtual/telehealth visit, some criteria may be missing or patient reported. Any vitals not documented were not able to be obtained and vitals that have been documented are patient reported.  VideoDeclined- This patient declined Librarian, academic. Therefore the visit was completed with audio only.  Persons Participating in Visit: Patient.  AWV Questionnaire: No: Patient Medicare AWV questionnaire was not completed prior to this visit.  Cardiac Risk Factors include: advanced age (>41men, >77 women);dyslipidemia;hypertension     Objective:    Today's Vitals   12/30/23 1304  Weight: 125 lb (56.7 kg)  Height: 5' 3 (1.6 m)   Body mass index is 22.14 kg/m.     12/30/2023    1:04 PM 11/01/2023    3:15 PM 10/14/2023    2:41 PM 09/10/2023    3:45 PM 08/26/2023    2:38 PM 05/03/2023    9:01 AM 12/24/2022    1:16 PM  Advanced Directives  Does Patient Have a Medical Advance Directive? Yes Yes Yes No No Yes Yes  Type of Estate agent of [place];Living will Healthcare Power of [employer] of [place];Living will   Healthcare Power of [employer] of [place];Living will   Does patient want to make changes to medical advance directive?  No - Patient declined No - Patient declined   No - Patient declined   Copy of Healthcare Power of Attorney in Chart? No - copy requested No - copy requested No - copy requested   No - copy requested No - copy requested  Would patient like information on creating a medical advance directive?    No - Patient declined       Current Medications (verified) Outpatient Encounter Medications as of 12/30/2023  Medication Sig   aspirin  EC 81 MG tablet Take 81 mg by mouth daily.   atorvastatin  (LIPITOR) 10 MG tablet TAKE 1 TABLET EVERY DAY   Cholecalciferol (VITAMIN D3) 25 MCG (1000 UT) CAPS Take 1 capsule (1,000 Units total) by mouth daily.   Ferrous Sulfate  (IRON) 325 (65 Fe) MG TABS Take 1 tablet by mouth daily.   folic acid  (FOLVITE ) 400 MCG tablet Take 800 mcg by mouth daily.   lamoTRIgine  (LAMICTAL ) 25 MG tablet Take 1 tablet (25 mg total) by mouth daily.   memantine  (NAMENDA  XR) 21 MG CP24 24 hr capsule Take 1 capsule (21 mg total) by mouth daily.   sertraline  (ZOLOFT ) 100 MG tablet Take 1 tablet (100 mg total) by mouth daily. Take with 50mg  tablet daily   sertraline  (ZOLOFT ) 25 MG tablet Take 1 tablet (25 mg total) by mouth daily.   sertraline  (ZOLOFT ) 50 MG tablet Take 1 tablet (50 mg total) by mouth daily.   tacrolimus  (PROGRAF ) 0.5 MG capsule Take 0.5 mg by mouth 2 (two) times  daily. 6 by mouth daily NAME BRAND ONLY   No facility-administered encounter medications on file as of 12/30/2023.    Allergies (verified) Codeine, Other, Penicillins, Phenergan  [promethazine ], Sulfa antibiotics, Tamsulosin , and Lidocaine    History: Past Medical History:  Diagnosis Date   Abdominal pain    Acute cystitis without hematuria 03/12/2022   Allergic rhinitis 11/14/2012   Amblyopia of eye, left 06/17/2017   Asbestos exposure 04/08/2018   Asthma    [id number] due to black mold house, no asthma now   Atherosclerosis of aorta 01/28/2023    Broken ankle    right   Broken rib    x2   Cataract    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 03/26/2011   Chronic hepatitis C virus infection 05/31/2013   Complication of anesthesia    IV phenergan   serious mental reaction   Delusional thoughts 03/18/2022   Diarrhea 03/13/2022   Dysuria 07/29/2023   Essential hypertension 10/14/2017   Gastroesophageal reflux disease 10/14/2017   Generalized anxiety disorder    Hernia, femoral    left   History of blood transfusion 1971   acquired hepatitis c   History of kidney stones    History of liver transplant 11/14/2012   History of nephrolithiasis 10/14/2017   History of shingles    Hyperglycemia 04/08/2018   Hyperlipidemia 01/15/2021   The 10-year ASCVD risk score (Arnett DK, et al., 2019) is: 16.2%    Values used to calculate the score:      Age: 32 years      Sex: Female      Is Non-Hispanic African American: No      Diabetic: No      Tobacco smoker: No      Systolic Blood Pressure: 136 mmHg      Is BP treated: No      HDL Cholesterol: 51.2 mg/dL      Total Cholesterol: 202 mg/dL     Intrinsic eczema 91/96/7977   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   Major depressive disorder 11/14/2012   Malignant neoplasm of liver 11/14/2012   Migraine 07/02/2015   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   vascular/medical; concerns for concurrent Alzheimer's disease   Overweight (BMI 25.0-29.9) 06/28/2015   PCO (posterior capsular opacification), right 03/26/2011   Seasonal affective disorder    Splenic artery aneurysm    Stage 4 chronic kidney disease 07/29/2023   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-will get Avastin  injections off/on   Vitamin D  deficiency 04/08/2018   Weight loss, non-intentional 03/12/2022   Past Surgical History:  Procedure Laterality Date   CATARACT EXTRACTION Bilateral    CHOLECYSTECTOMY   1997   CYSTOSCOPY W/ URETERAL STENT PLACEMENT Left 08/14/2016   Procedure: CYSTOSCOPY WITH RETROGRADE PYELOGRAM/URETERAL LEFT URETEROSCOPY AND LEFT STENT PLACEMENT;  Surgeon: Gretel Ferrara, MD;  Location: [facility];  Service: Urology;  Laterality: Left;   CYSTOSCOPY/URETEROSCOPY/HOLMIUM LASER/STENT PLACEMENT Left 07/06/2016   Procedure: CYSTOSCOPY/URETEROSCOPY/ BILATERAL RETROGRADE/HOLMIUM LASER/STENT PLACEMENT/BASKET STONE REMOVAL;  Surgeon: Gretel Ferrara, MD;  Location: [facility];  Service: Urology;  Laterality: Left;   CYSTOSCOPY/URETEROSCOPY/HOLMIUM LASER/STENT PLACEMENT Left 08/06/2016   Procedure: CYSTOSCOPY/URETEROSCOPY/HOLMIUM LASER/STENT PLACEMENT;  Surgeon: Gretel Ferrara, MD;  Location: [facility];  Service: Urology;  Laterality: Left;   CYSTOSCOPY/URETEROSCOPY/HOLMIUM LASER/STENT PLACEMENT Left 08/31/2016   Procedure: CYSTOSCOPY/URETEROSCOPY/HOLMIUM LASER/STENT PLACEMENT/BASKET STONE REMOVAL;  Surgeon: Gretel Ferrara,  MD;  Location: [facility];  Service: Urology;  Laterality: Left;   FEMORAL HERNIA REPAIR Left 1997   left leg-   LIVER TRANSPLANTATION     NASAL SINUS SURGERY  1997   x3   TONSILLECTOMY  age 59   TUBAL LIGATION     Family History  Adopted: Yes  Problem Relation Age of Onset   Stroke Mother    Alzheimer's disease Mother    Cancer Mother    Diabetes Mother        Questionable, per new patient packet    Arthritis Mother        Questionable, per new patient packet    Alcoholism Father    Suicidality Sister    Depression Sister    COPD Sister    COPD Brother        Heavy smoker, per new patient packet    OCD Daughter    Personality disorder Daughter    Alcoholism Son    Bipolar disorder Granddaughter        Age 31 as of 07/10/2020   Schizophrenia Granddaughter    Social History   Socioeconomic History   Marital status: Divorced    Spouse name: Not on file   Number of children: 2   Years of education: 14   Highest education level: Associate degree: occupational, Scientist, product/process development,  or vocational program  Occupational History   Occupation: Disabled  Tobacco Use   Smoking status: Never   Smokeless tobacco: Never  Vaping Use   Vaping status: Never Used  Substance and Sexual Activity   Alcohol use: No   Drug use: No   Sexual activity: Never    Comment: 14 years ago  Other Topics Concern   Not on file  Social History Narrative   Lives at alone alone.   Right-handed.   No caffeine use.      PSC- as of 10/12/17   Diet: No Fried Foods       Caffeine: Summer time, sweet tea       Married, if yes what year: Divorced       Do you live in a house, apartment, assisted living, condo, trailer, ect: [place] [facility], 1 stories, and 1 person       Pets: 1 dog       Current/Past profession: Dentist       Exercise: Some, walking dog       Living Will: yes   DNR: yes   POA/HPOA: yes      Functional Status: Patient did not answer   Do you have difficulty bathing or dressing yourself?   Do you have difficulty preparing food or eating?   Do you have difficulty managing your medications?   Do you have difficulty managing your finances?   Do you have difficulty affording your medications?   Social Drivers of Corporate investment banker Strain: Low Risk  (12/30/2023)   Overall Financial Resource Strain (CARDIA)    Difficulty of Paying Living Expenses: Not hard at all  Food Insecurity: No Food Insecurity (12/30/2023)   Hunger Vital Sign    Worried About Running Out of Food in the Last Year: Never true    Ran Out of Food in the Last Year: Never true  Transportation Needs: No Transportation Needs (12/30/2023)   PRAPARE - Administrator, Civil Service (Medical): No    Lack of Transportation (Non-Medical): No  Physical Activity: Insufficiently Active (12/30/2023)   Exercise Vital Sign  Days of Exercise per Week: 5 days    Minutes of Exercise per Session: 10 min  Stress: Stress Concern Present (12/30/2023)   [employer] of Occupational Health -  Occupational Stress Questionnaire    Feeling of Stress: To some extent  Social Connections: Moderately Isolated (12/30/2023)   Social Connection and Isolation Panel    Frequency of Communication with Friends and Family: More than three times a week    Frequency of Social Gatherings with Friends and Family: Twice a week    Attends Religious Services: Never    Database administrator or Organizations: Yes    Attends Banker Meetings: Never    Marital Status: Divorced    Tobacco Counseling Counseling given: No    Clinical Intake:  Pre-visit preparation completed: Yes  Pain : No/denies pain     BMI - recorded: 22.14 Nutritional Status: BMI of 19-24  Normal Nutritional Risks: None Diabetes: No  Lab Results  Component Value Date   HGBA1C 5.4 03/12/2022   HGBA1C 5.3 12/23/2018   HGBA1C 5.3 12/31/2017     How often do you need to have someone help you when you read instructions, pamphlets, or other written materials from your doctor or pharmacy?: 3 - Sometimes (Dtr assists)  Interpreter Needed?: No  Information entered by :: Verdie Saba, CMA   Activities of Daily Living     12/30/2023    1:09 PM  In your present state of health, do you have any difficulty performing the following activities:  Hearing? 0  Vision? 0  Difficulty concentrating or making decisions? 1  Comment Dtr assists  Walking or climbing stairs? 1  Comment uses a cane  Dressing or bathing? 0  Doing errands, shopping? 1  Comment Dtr and granddtr helps  Quarry manager and eating ? N  Using the Toilet? N  In the past six months, have you accidently leaked urine? Y  Comment wears a pad  Do you have problems with loss of bowel control? Y  Managing your Medications? N  Managing your Finances? N  Housekeeping or managing your Housekeeping? N    Patient Care Team: Joshua Debby CROME, MD as PCP - General (Internal Medicine) Cary Doffing, MD as Consulting Physician (Dermatology) Cheree Banks, MD as Referring Physician (Ophthalmology) Renda Glance, MD as Consulting Physician (Urology) Legrand Victory CROME MOULD, MD as Consulting Physician (Gastroenterology)  I have updated your Care Teams any recent Medical Services you may have received from other providers in the past year.     Assessment:   This is a routine wellness examination for Candace Hill.  Hearing/Vision screen Hearing Screening - Comments:: Denies hearing difficulties   Vision Screening - Comments:: Wears rx glasses - up to date with routine eye exams with Dr Cheree   Goals Addressed               This Visit's Progress     Patient Stated (pt-stated)        Patient stated she needs to try to go more walking - continue being active but wants to be able to do more       Depression Screen     12/30/2023    1:26 PM 09/14/2023    3:28 PM 12/24/2022    1:28 PM 10/27/2022   10:38 AM 08/28/2022    7:53 AM 07/17/2022    3:29 PM 03/12/2022    2:54 PM  PHQ 2/9 Scores  PHQ - 2 Score 1 0 2 0  0  PHQ- 9 Score 5 0 5    0     Information is confidential and restricted. Go to Review Flowsheets to unlock data.    Fall Risk     12/30/2023    1:11 PM 11/01/2023    3:15 PM 07/28/2023    4:02 PM 05/03/2023    9:12 AM 04/27/2023    3:55 PM  Fall Risk   Falls in the past year? 1 1 0 0 0  Number falls in past yr: 1 1 0 0 0  Comment 2      Injury with Fall? 0 1 0 0 0  Risk for fall due to : Impaired balance/gait  No Fall Risks  No Fall Risks  Follow up Falls prevention discussed;Falls evaluation completed Falls evaluation completed Falls evaluation completed Falls evaluation completed Falls evaluation completed    MEDICARE RISK AT HOME:  Medicare Risk at Home Any stairs in or around the home?: No If so, are there any without handrails?: No Home free of loose throw rugs in walkways, pet beds, electrical cords, etc?: Yes Adequate lighting in your home to reduce risk of falls?: Yes Life alert?: No Use of a cane, walker  or w/c?: Yes (cane) Grab bars in the bathroom?: Yes Shower chair or bench in shower?: No Elevated toilet seat or a handicapped toilet?: No  TIMED UP AND GO:  Was the test performed?  No  Cognitive Function: 6CIT completed    05/03/2023   12:00 PM 11/11/2022    4:00 PM 12/23/2018    9:09 AM 12/20/2017   11:44 AM  MMSE - Mini Mental State Exam  Orientation to time 4 5 4 4   Orientation to Place 5 5 4 4   Registration 3 3 3 3   Attention/ Calculation 5 5 5 5   Recall 2 2 3 3   Language- name 2 objects 2 2 2 2   Language- repeat 1 1 1 1   Language- follow 3 step command 3 3 3 3   Language- read & follow direction 1 1 1 1   Write a sentence 1 1 1 1   Copy design 1 1 1 1   Total score 28 29 28 28       07/29/2021   11:00 AM  Montreal Cognitive Assessment   Visuospatial/ Executive (0/5) 4  Naming (0/3) 3  Attention: Read list of digits (0/2) 2  Attention: Read list of letters (0/1) 1  Attention: Serial 7 subtraction starting at 100 (0/3) 1  Language: Repeat phrase (0/2) 2  Language : Fluency (0/1) 1  Abstraction (0/2) 2  Delayed Recall (0/5) 0  Orientation (0/6) 6  Total 22  Adjusted Score (based on education) 22      12/30/2023    1:15 PM 12/24/2022    1:21 PM 03/10/2021    5:59 PM 01/02/2020   11:42 AM  6CIT Screen  What Year? 0 points 0 points 0 points 0 points  What month? 0 points 0 points 0 points 0 points  What time? 0 points 0 points 0 points 0 points  Count back from 20 0 points 0 points 0 points 0 points  Months in reverse 0 points 0 points 0 points 0 points  Repeat phrase 4 points 8 points 10 points 2 points  Total Score 4 points 8 points 10 points 2 points    Immunizations Immunization History  Administered Date(s) Administered   Fluad Quad(high Dose 65+) 02/15/2019, 03/12/2022   Fluad Trivalent(High Dose 65+) 04/27/2023   Influenza, Seasonal,  Injecte, Preservative Fre 03/18/2009   Influenza,inj,Quad PF,6+ Mos 04/08/2018, 07/10/2020   Influenza-Unspecified  03/16/2017   Moderna Sars-Covid-2 Vaccination 06/14/2019, 07/12/2019   Pneumococcal Conjugate-13 07/12/2018   Pneumococcal Polysaccharide-23 07/10/2020    Screening Tests Health Maintenance  Topic Date Due   Zoster Vaccines- Shingrix  (1 of 2) Never done   Hepatitis B Vaccines (1 of 3 - Risk 3-dose series) Never done   DTaP/Tdap/Td (1 - Tdap) 04/26/2024 (Originally 10/17/1964)   INFLUENZA VACCINE  01/14/2024   Medicare Annual Wellness (AWV)  12/29/2024   Pneumococcal Vaccine: 50+ Years  Completed   DEXA SCAN  Completed   Hepatitis C Screening  Completed   HPV VACCINES  Aged Out   Meningococcal B Vaccine  Aged Out   Colonoscopy  Discontinued   COVID-19 Vaccine  Discontinued    Health Maintenance  Health Maintenance Due  Topic Date Due   Zoster Vaccines- Shingrix  (1 of 2) Never done   Hepatitis B Vaccines (1 of 3 - Risk 3-dose series) Never done   Health Maintenance Items Addressed:12/30/2023  Additional Screening:  Vision Screening: Recommended annual ophthalmology exams for early detection of glaucoma and other disorders of the eye. Would you like a referral to an eye doctor? No    Dental Screening: Recommended annual dental exams for proper oral hygiene  Community Resource Referral / Chronic Care Management: CRR required this visit?  No   CCM required this visit?  No   Plan:    I have personally reviewed and noted the following in the patient's chart:   Medical and social history Use of alcohol, tobacco or illicit drugs  Current medications and supplements including opioid prescriptions. Patient is not currently taking opioid prescriptions. Functional ability and status Nutritional status Physical activity Advanced directives List of other physicians Hospitalizations, surgeries, and ER visits in previous 12 months Vitals Screenings to include cognitive, depression, and falls Referrals and appointments  In addition, I have reviewed and discussed with patient  certain preventive protocols, quality metrics, and best practice recommendations. A written personalized care plan for preventive services as well as general preventive health recommendations were provided to patient.   Verdie CHRISTELLA Saba, CMA   12/30/2023   After Visit Summary: (MyChart) Due to this being a telephonic visit, the after visit summary with patients personalized plan was offered to patient via MyChart   Notes: Nothing significant to report at this time.

## 2023-12-30 NOTE — Patient Instructions (Addendum)
 Ms. Dols , Thank you for taking time out of your busy schedule to complete your Annual Wellness Visit with me. I enjoyed our conversation and look forward to speaking with you again next year. I, as well as your care team,  appreciate your ongoing commitment to your health goals. Please review the following plan we discussed and let me know if I can assist you in the future. Your Game plan/ To Do List   Follow up Visits: Next Medicare AWV with our clinical staff: 01/04/2025 - in the office Have you seen your provider in the last 6 months (3 months if uncontrolled diabetes)? No Next Office Visit with your provider: 01/05/2024  Clinician Recommendations:  Aim for 30 minutes of exercise or brisk walking, 6-8 glasses of water, and 5 servings of fruits and vegetables each day. Educated and advised on getting the Hep B and Shingles vaccines in 2025 at the office.      This is a list of the screening recommended for you and due dates:  Health Maintenance  Topic Date Due   Zoster (Shingles) Vaccine (1 of 2) Never done   Hepatitis B Vaccine (1 of 3 - Risk 3-dose series) Never done   DTaP/Tdap/Td vaccine (1 - Tdap) 04/26/2024*   Flu Shot  01/14/2024   Medicare Annual Wellness Visit  12/29/2024   Pneumococcal Vaccine for age over 38  Completed   DEXA scan (bone density measurement)  Completed   Hepatitis C Screening  Completed   HPV Vaccine  Aged Out   Meningitis B Vaccine  Aged Out   Colon Cancer Screening  Discontinued   COVID-19 Vaccine  Discontinued  *Topic was postponed. The date shown is not the original due date.    Advanced directives: (In Chart) A copy of your advanced directives are scanned into your chart should your provider ever need it. Advance Care Planning is important because it:  [x]  Makes sure you receive the medical care that is consistent with your values, goals, and preferences  [x]  It provides guidance to your family and loved ones and reduces their decisional burden  about whether or not they are making the right decisions based on your wishes.  Follow the link provided in your after visit summary or read over the paperwork we have mailed to you to help you started getting your Advance Directives in place. If you need assistance in completing these, please reach out to us  so that we can help you!

## 2024-01-03 DIAGNOSIS — Z5181 Encounter for therapeutic drug level monitoring: Principal | ICD-10-CM

## 2024-01-03 DIAGNOSIS — Z944 Liver transplant status: Principal | ICD-10-CM

## 2024-01-03 DIAGNOSIS — E612 Magnesium deficiency: Principal | ICD-10-CM

## 2024-01-05 ENCOUNTER — Encounter: Payer: Self-pay | Admitting: Internal Medicine

## 2024-01-05 ENCOUNTER — Ambulatory Visit: Admitting: Internal Medicine

## 2024-01-05 VITALS — BP 112/66 | HR 76 | Temp 98.3°F | Resp 16 | Ht 63.0 in | Wt 126.8 lb

## 2024-01-05 DIAGNOSIS — I10 Essential (primary) hypertension: Secondary | ICD-10-CM | POA: Diagnosis not present

## 2024-01-05 NOTE — Progress Notes (Signed)
 Subjective:  Patient ID: Candace Hill, female    DOB: 1945-09-18  Age: 78 y.o. MRN: [id number]  CC: Hypertension   HPI Candace Hill presents for f/up ----  Discussed the use of AI scribe software for clinical note transcription with the patient, who gave verbal consent to proceed.  History of Present Illness Candace Hill is a 78 year old female with hypertension who presents for blood pressure management.  She has been advised to maintain her blood pressure between 110 and 130 mmHg. She experiences no dizziness or lightheadedness and takes care to rise slowly to prevent these symptoms.  No swelling in her legs or feet, nausea, or vomiting. She has experienced some weight loss. Her current diet consists of two meals a day, brunch and supper.    Outpatient Medications Prior to Visit  Medication Sig Dispense Refill   aspirin  EC 81 MG tablet Take 81 mg by mouth daily.     atorvastatin  (LIPITOR) 10 MG tablet TAKE 1 TABLET EVERY DAY 90 tablet 1   Cholecalciferol (VITAMIN D3) 25 MCG (1000 UT) CAPS Take 1 capsule (1,000 Units total) by mouth daily. 90 capsule 0   Ferrous Sulfate  (IRON) 325 (65 Fe) MG TABS Take 1 tablet by mouth daily.     folic acid  (FOLVITE ) 400 MCG tablet Take 800 mcg by mouth daily.     lamoTRIgine  (LAMICTAL ) 25 MG tablet Take 1 tablet (25 mg total) by mouth daily. 35 tablet 1   memantine  (NAMENDA  XR) 21 MG CP24 24 hr capsule Take 1 capsule (21 mg total) by mouth daily. 90 capsule 0   sertraline  (ZOLOFT ) 100 MG tablet Take 1 tablet (100 mg total) by mouth daily. Take with 50mg  tablet daily 35 tablet 1   sertraline  (ZOLOFT ) 25 MG tablet Take 1 tablet (25 mg total) by mouth daily. 35 tablet 1   sertraline  (ZOLOFT ) 50 MG tablet Take 1 tablet (50 mg total) by mouth daily. 35 tablet 1   tacrolimus  (PROGRAF ) 0.5 MG capsule Take 0.5 mg by mouth 2 (two) times daily. 6 by mouth daily NAME BRAND ONLY     No facility-administered medications prior to visit.     ROS Review of Systems  Constitutional:  Negative for appetite change, chills, diaphoresis and fever.  HENT: Negative.    Respiratory: Negative.  Negative for cough, chest tightness, shortness of breath and wheezing.   Cardiovascular:  Negative for chest pain and leg swelling.  Gastrointestinal:  Positive for constipation. Negative for abdominal pain, blood in stool, diarrhea, nausea and vomiting.  Genitourinary: Negative.  Negative for difficulty urinating.  Musculoskeletal: Negative.   Skin: Negative.   Neurological:  Negative for dizziness and weakness.  Hematological:  Negative for adenopathy. Does not bruise/bleed easily.  Psychiatric/Behavioral:  Positive for confusion, decreased concentration and dysphoric mood. The patient is nervous/anxious.     Objective:  BP 112/66 (BP Location: Left Arm, Patient Position: Sitting, Cuff Size: Normal)   Pulse 76   Temp 98.3 F (36.8 C) (Oral)   Resp 16   Ht 5' 3 (1.6 m)   Wt 126 lb 12.8 oz (57.5 kg)   SpO2 98%   BMI 22.46 kg/m   BP Readings from Last 3 Encounters:  01/05/24 112/66  11/01/23 (!) 148/78  10/26/23 130/80    Wt Readings from Last 3 Encounters:  01/05/24 126 lb 12.8 oz (57.5 kg)  12/30/23 125 lb (56.7 kg)  11/01/23 132 lb (59.9 kg)    Physical Exam Vitals reviewed.  Constitutional:  Appearance: Normal appearance.  HENT:     Mouth/Throat:     Mouth: Mucous membranes are moist.  Eyes:     General: No scleral icterus.    Conjunctiva/sclera: Conjunctivae normal.  Cardiovascular:     Rate and Rhythm: Normal rate and regular rhythm.     Heart sounds: No murmur heard.    No friction rub. No gallop.  Pulmonary:     Effort: Pulmonary effort is normal.     Breath sounds: No stridor. No wheezing, rhonchi or rales.  Abdominal:     General: Abdomen is flat.     Palpations: There is no mass.     Tenderness: There is no abdominal tenderness. There is no guarding.     Hernia: No hernia is present.   Musculoskeletal:        General: Normal range of motion.     Cervical back: Neck supple.     Right lower leg: No edema.     Left lower leg: No edema.  Lymphadenopathy:     Cervical: No cervical adenopathy.  Skin:    Coloration: Skin is pale.  Neurological:     Mental Status: She is alert. Mental status is at baseline.     Lab Results  Component Value Date   WBC 6.3 09/10/2023   HGB 11.8 (L) 09/10/2023   HCT 34.8 (L) 09/10/2023   PLT 155 09/10/2023   GLUCOSE 99 09/10/2023   CHOL 161 01/25/2023   TRIG 132.0 01/25/2023   HDL 52.90 01/25/2023   LDLCALC 82 01/25/2023   ALT 22 09/10/2023   AST 33 09/10/2023   NA 138 09/10/2023   K 5.1 09/10/2023   CL 107 09/10/2023   CREATININE 1.54 (H) 09/10/2023   BUN 16 09/10/2023   CO2 21 (L) 09/10/2023   TSH 3.01 01/25/2023   INR 0.9 04/15/2021   HGBA1C 5.4 03/12/2022    CT CARDIAC SCORING Addendum Date: 12/01/2023 ADDENDUM REPORT: 12/01/2023 20:56 EXAM: OVER-READ INTERPRETATION  CT CHEST The following report is an over-read performed by radiologist Dr. Morgane Naveauof [facility] on 12/01/2023. This over-read does not include interpretation of cardiac or coronary anatomy or pathology. The CT cardiac coronary artery interpretation by the cardiologist is attached. COMPARISON:  CT angio abdomen 03/31/2023 FINDINGS: Lingular linear atelectasis versus scarring. Visualized portions of the lungs are clear. No lymphadenopathy. Atherosclerotic plaque. No acute upper abdominal abnormality. No acute osseous abnormality. Multilevel degenerative change of the spine. Surgical clips noted within the upper abdomen. No acute soft tissue abnormality. IMPRESSION: Otherwise no acute abnormality. Electronically Signed   By: Morgane  Naveau M.D.   On: 12/01/2023 20:56   Result Date: 12/01/2023 : CLINICAL DATA:  Cardiovascular Disease Risk stratification EXAM: Coronary Calcium  Score TECHNIQUE: A gated, non-contrast computed tomography scan of the  heart was performed using 3mm slice thickness. Axial images were analyzed on a dedicated workstation. Calcium  scoring of the coronary arteries was performed using the Agatston method. FINDINGS: Coronary Calcium  Score: Left main: 0 Left anterior descending artery: 259 Left circumflex artery: 0 Right coronary artery: 14 Total: 273 Percentile: 76 Pericardium: Normal. Ascending Aorta: Normal caliber. Mild calcifications of the descending thoracic aorta noted Pulmonary artery: Normal caliber Non-cardiac: See separate report from [facility]. IMPRESSION: Coronary calcium  score of 273. This was 76 percentile for age-, race-, and sex-matched controls. RECOMMENDATIONS: Coronary artery calcium  (CAC) score is a strong predictor of incident coronary heart disease (CHD) and provides predictive information beyond traditional risk factors. CAC scoring is reasonable to use  in the decision to withhold, postpone, or initiate statin therapy in intermediate-risk or selected borderline-risk asymptomatic adults (age 76-75 years and LDL-C >=70 to <190 mg/dL) who do not have diabetes or established atherosclerotic cardiovascular disease (ASCVD).* In intermediate-risk (10-year ASCVD risk >=7.5% to <20%) adults or selected borderline-risk (10-year ASCVD risk >=5% to <7.5%) adults in whom a CAC score is measured for the purpose of making a treatment decision the following recommendations have been made: If CAC=0, it is reasonable to withhold statin therapy and reassess in 5 to 10 years, as long as higher risk conditions are absent (diabetes mellitus, family history of premature CHD in first degree relatives (males <55 years; females <65 years), cigarette smoking, or LDL >=190 mg/dL). If CAC is 1 to 99, it is reasonable to initiate statin therapy for patients >=55 years of age. If CAC is >=100 or >=75th percentile, it is reasonable to initiate statin therapy at any age. Cardiology referral should be considered for patients with CAC  scores >=400 or >=75th percentile. *2018 AHA/ACC/AACVPR/AAPA/ABC/ACPM/ADA/AGS/APhA/ASPC/NLA/PCNA Guideline on the Management of Blood Cholesterol: A Report of the American College of Cardiology/American Heart Association Task Force on Clinical Practice Guidelines. J Am Coll Cardiol. 2019;73(24):3168-3209. Electronically Signed: By: Lamar Fitch M.D. On: 10/31/2023 18:13    Assessment & Plan:    Essential hypertension- BP is very well controlled.     Follow-up: No follow-ups on file.  Debby Molt, MD

## 2024-01-06 NOTE — Unmapped (Signed)
 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     Was a Nurse, learning disability used for this call? No    Completed refill call assessment today to schedule patient's medication shipment from the [facility]  [phone]).  All relevant notes have been reviewed.     Specialty medication(s) and dose(s) confirmed: Regimen is correct and unchanged.   Changes to medications: Palma reports no changes at this time.  Changes to insurance: No  New side effects reported not previously addressed with a pharmacist or physician: None reported  Questions for the pharmacist: No    Confirmed patient received a Conservation officer, historic buildings and a Surveyor, mining with first shipment. The patient will receive a drug information handout for each medication shipped and additional FDA Medication Guides as required.       DISEASE/MEDICATION-SPECIFIC INFORMATION        N/A    SPECIALTY MEDICATION ADHERENCE     Medication Adherence    Patient reported X missed doses in the last month: 0  Specialty Medication: tacrolimus  0.5 MG capsule (PROGRAF )  Patient is on additional specialty medications: No              Were doses missed due to medication being on hold? No    tacrolimus  0.5 MG capsule (PROGRAF )  5 days of medicine on hand       REFERRAL TO PHARMACIST     Referral to the pharmacist: Not needed      [facility]     Shipping address confirmed in Epic.     Cost and Payment: Patient has a copay of $47.00 . They are aware and have authorized the pharmacy to charge the credit card on file.    Delivery Scheduled: Yes, Expected medication delivery date: 01/10/2024.     Medication will be delivered via UPS to the prescription address in Epic WAM.    [name]  Specialty Technician

## 2024-01-07 MED FILL — TACROLIMUS 0.5 MG CAPSULE, IMMEDIATE-RELEASE: ORAL | 30 days supply | Qty: 180 | Fill #2

## 2024-01-07 NOTE — Unmapped (Signed)
 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    Almar[employer]

## 2024-01-10 ENCOUNTER — Ambulatory Visit (HOSPITAL_COMMUNITY): Admitting: Psychiatry

## 2024-01-10 DIAGNOSIS — F411 Generalized anxiety disorder: Secondary | ICD-10-CM

## 2024-01-10 DIAGNOSIS — Z5181 Encounter for therapeutic drug level monitoring: Principal | ICD-10-CM

## 2024-01-10 DIAGNOSIS — Z944 Liver transplant status: Principal | ICD-10-CM

## 2024-01-10 DIAGNOSIS — E612 Magnesium deficiency: Principal | ICD-10-CM

## 2024-01-10 NOTE — Progress Notes (Signed)
 [facility] [address] [place] [address] [place] [address] Dept: [phone] Dept Fax: [phone]  Psychotherapy Progress Note  Patient ID: Candace Hill, female  DOB: 07-02-1945, 78 y.o.  MRN: [id number]  01/10/2024 Start time: 3:30 PM End time: 4:30 PM  Method of Visit: Face-to-Face  Present: patient  Current Concerns: family tension with daughter  Current Symptoms: Anxiety, Family Stress  Psychiatric Specialty Exam: General Appearance: appears at stated age, casually dressed and groomed   Behavior: pleasant and cooperative   Psychomotor Activity: no psychomotor agitation or retardation noted   Eye Contact: fair  Speech: normal amount, volume and fluency    Mood: euthymic  Affect: congruent, pleasant and interactive   Thought Process: linear, goal directed, no circumstantial or tangential thought process noted, no racing thoughts or flight of ideas  Descriptions of Associations: intact   Thought Content Hallucinations: denies AH, VH , does not appear responding to stimuli  Delusions: no paranoia, delusions of control, grandeur, ideas of reference, thought broadcasting, and magical thinking  Suicidal Thoughts: denies SI, intention, plan  Homicidal Thoughts: denies HI, intention, plan   Alertness/Orientation: alert and fully oriented   Insight: fair Judgment: fair  Memory: intact   Executive Functions  Concentration: intact  Attention Span: fair  Recall: intact  Fund of Knowledge: fair    Diagnosis: MDD, GAD  Anticipated Frequency of Visits: every other week Anticipated Length of Treatment Episode: 6 months  Short Term Goals/Goals for Treatment Session:  Understanding automatic thoughts Introducing thought monitoring using real-life situations  Patient discussed a recent event this last week with her daughter's birthday and how she felt that her daughter let her down because her  daughter did not invite the pt to her birthday dinner.  She states that she typically would be the one who would treat her family out but this year, her daughter did not want to get dinner with her and the patient felt rejected.  We incorporated the situation and analyze it in a thought record template.  Patient stated that she felt like her daughter purposely let her and her grandchildren down.  She believes this thought 90%.  This in turn made her feel angry, sad, and confused at 100%.  We then thought of adaptive responses in which she discusses how the possibility of her daughter not wanting to eat unhealthy food, had to clean the house, or her grandchild's migraine was worse during that day which could have been a reasons as to why she did not attend the birthday dinner.  In turn, she stated that she felt less sad and confused and rated it an 80%.  She had difficulty coming up with an alternative behavior moving forward and we discussed how this would be a homework assignment for the next session.  Another homework assignment would be for her to fill out 1 a row of a thought record to discuss in the next session.  Progress Towards Goals: Progressing as evidenced to discussion of topics from previous session on how she is coping with family tensions  Treatment Intervention: Cognitive Behavioral therapy  Medical Necessity: Improved patient condition  Assessment Tools:    12/30/2023    1:26 PM 09/14/2023    3:28 PM 12/24/2022    1:28 PM  Depression screen PHQ 2/9  Decreased Interest 0 0 1  Down, Depressed, Hopeless 1 0 1  PHQ - 2 Score 1 0 2  Altered sleeping 0 0 1  Tired, decreased energy  1 0 1  Change in appetite 0 0 0  Feeling bad or failure about yourself  0 0 0  Trouble concentrating 3 0 1  Moving slowly or fidgety/restless  0 0  Suicidal thoughts 0 0 0  PHQ-9 Score 5 0 5  Difficult doing work/chores Somewhat difficult Not difficult at all Somewhat difficult   Failed to redirect to  the Timeline version of the REVFS SmartLink. Flowsheet Row ED from 09/10/2023 in [facility] Emergency Department at [facility] ED from 08/26/2023 in [facility] Emergency Department at [facility] Counselor from 12/30/2022 in [facility] at [facility] RISK CATEGORY No Risk No Risk Low Risk    Collaboration of Care: Psychiatrist AEB myself  Patient/Guardian was advised Release of Information must be obtained prior to any record release in order to collaborate their care with an outside provider. Patient/Guardian was advised if they have not already done so to contact the registration department to sign all necessary forms in order for us  to release information regarding their care.   Consent: Patient/Guardian gives verbal consent for treatment and assignment of benefits for services provided during this visit. Patient/Guardian expressed understanding and agreed to proceed.   Plan: thought record analysis, f/u in 2 weeks  Ismael KATHEE Franco, MD 01/10/2024

## 2024-01-11 ENCOUNTER — Encounter: Payer: Self-pay | Admitting: Cardiology

## 2024-01-11 ENCOUNTER — Ambulatory Visit: Attending: Cardiology | Admitting: Cardiology

## 2024-01-11 VITALS — BP 110/68 | HR 75 | Ht 63.0 in | Wt 127.0 lb

## 2024-01-11 DIAGNOSIS — R931 Abnormal findings on diagnostic imaging of heart and coronary circulation: Secondary | ICD-10-CM | POA: Insufficient documentation

## 2024-01-11 DIAGNOSIS — I10 Essential (primary) hypertension: Secondary | ICD-10-CM

## 2024-01-11 DIAGNOSIS — E782 Mixed hyperlipidemia: Secondary | ICD-10-CM

## 2024-01-11 NOTE — Patient Instructions (Addendum)
Medication Instructions:  Your physician recommends that you continue on your current medications as directed. Please refer to the Current Medication list given to you today.  *If you need a refill on your cardiac medications before your next appointment, please call your pharmacy*   Lab Work: 3rd Floor   [address]  Your physician recommends that you return for lab work in: when fasting   You need to have labs done when you are fasting.  You can come Monday through Friday 8:00 am to 11:30AM and 1:00 to 4:00. You do not need to make an appointment as the order has already been placed.    Testing/Procedures: None Ordered   Follow-Up: At [facility], you and your health needs are our priority.  As part of our continuing mission to provide you with exceptional heart care, we have created designated Provider Care Teams.  These Care Teams include your primary Cardiologist (physician) and Advanced Practice Providers (APPs -  Physician Assistants and Nurse Practitioners) who all work together to provide you with the care you need, when you need it.  We recommend signing up for the patient portal called "MyChart".  Sign up information is provided on this After Visit Summary.  MyChart is used to connect with patients for Virtual Visits (Telemedicine).  Patients are able to view lab/test results, encounter notes, upcoming appointments, etc.  Non-urgent messages can be sent to your provider as well.   To learn more about what you can do with MyChart, go to [email or web address].    Your next appointment:   12 month(s)  The format for your next appointment:   In Person  Provider:   Gypsy Balsam, MD    Other Instructions NA

## 2024-01-11 NOTE — Progress Notes (Signed)
 Cardiology Office Note:    Date:  01/11/2024   ID:  Candace Hill, DOB 1945-10-08, MRN [id number]  PCP:  Joshua Debby CROME, MD  Cardiologist:  Lamar Fitch, MD    Referring MD: Joshua Debby CROME, MD   Chief Complaint  Patient presents with   Follow-up    History of Present Illness:    Candace Hill is a 78 y.o. female past medical history significant for cirrhosis of the liver.  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  She comes like always with her daughter.  Who is assisting with her care as well as participate in decision-making.  Overall she is doing fine.  She said she walked to the mailbox and back she does have some balance issue fell down few time therefore she is trying to be slow but still try to go there.  She was trying cane but could not manage.  Denies have any chest pain tightness squeezing pressure burning chest  Past Medical History:  Diagnosis Date   Abdominal pain    Acute cystitis without hematuria 03/12/2022   Allergic rhinitis 11/14/2012   Amblyopia of eye, left 06/17/2017   Asbestos exposure 04/08/2018   Asthma    [id number] due to black mold house, no asthma now   Atherosclerosis of aorta 01/28/2023   Broken ankle    right   Broken rib    x2   Cataract    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 03/26/2011   Chronic hepatitis C virus infection 05/31/2013   Complication of anesthesia    IV phenergan   serious mental reaction   Delusional thoughts 03/18/2022   Diarrhea 03/13/2022   Dysuria 07/29/2023   Essential hypertension 10/14/2017   Gastroesophageal reflux disease 10/14/2017   Generalized anxiety disorder    Hernia, femoral    left   History of  blood transfusion 1971   acquired hepatitis c   History of kidney stones    History of liver transplant 11/14/2012   History of nephrolithiasis 10/14/2017   History of shingles    Hyperglycemia 04/08/2018   Hyperlipidemia 01/15/2021   The 10-year ASCVD risk score (Arnett DK, et al., 2019) is: 16.2%    Values used to calculate the score:      Age: 89 years      Sex: Female      Is Non-Hispanic African American: No      Diabetic: No      Tobacco smoker: No      Systolic Blood Pressure: 136 mmHg      Is BP treated: No      HDL Cholesterol: 51.2 mg/dL      Total Cholesterol: 202 mg/dL     Intrinsic eczema 91/96/7977   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   Major depressive disorder 11/14/2012   Malignant neoplasm of liver 11/14/2012   Migraine 07/02/2015   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   vascular/medical; concerns for concurrent Alzheimer's disease   Overweight (BMI 25.0-29.9) 06/28/2015   PCO (posterior capsular opacification), right 03/26/2011   Seasonal affective disorder    Splenic artery aneurysm    Stage 4 chronic kidney disease 07/29/2023   Tailbone injury    broken  x3   TIA (transient ischemic attack)    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-will get Avastin  injections off/on   Vitamin D  deficiency 04/08/2018   Weight loss, non-intentional 03/12/2022    Past Surgical History:  Procedure Laterality Date   CATARACT EXTRACTION Bilateral    CHOLECYSTECTOMY  1997   CYSTOSCOPY W/ URETERAL STENT PLACEMENT Left 08/14/2016   Procedure: CYSTOSCOPY WITH RETROGRADE PYELOGRAM/URETERAL LEFT URETEROSCOPY AND LEFT STENT PLACEMENT;  Surgeon: Gretel Ferrara, MD;  Location: [facility];  Service: Urology;  Laterality: Left;   CYSTOSCOPY/URETEROSCOPY/HOLMIUM LASER/STENT PLACEMENT Left 07/06/2016   Procedure: CYSTOSCOPY/URETEROSCOPY/ BILATERAL RETROGRADE/HOLMIUM LASER/STENT PLACEMENT/BASKET STONE REMOVAL;  Surgeon: Gretel Ferrara, MD;  Location: [facility];  Service:  Urology;  Laterality: Left;   CYSTOSCOPY/URETEROSCOPY/HOLMIUM LASER/STENT PLACEMENT Left 08/06/2016   Procedure: CYSTOSCOPY/URETEROSCOPY/HOLMIUM LASER/STENT PLACEMENT;  Surgeon: Gretel Ferrara, MD;  Location: [facility];  Service: Urology;  Laterality: Left;   CYSTOSCOPY/URETEROSCOPY/HOLMIUM LASER/STENT PLACEMENT Left 08/31/2016   Procedure: CYSTOSCOPY/URETEROSCOPY/HOLMIUM LASER/STENT PLACEMENT/BASKET STONE REMOVAL;  Surgeon: Gretel Ferrara, MD;  Location: [facility];  Service: Urology;  Laterality: Left;   FEMORAL HERNIA REPAIR Left 1997   left leg-   LIVER TRANSPLANTATION     NASAL SINUS SURGERY  1997   x3   TONSILLECTOMY  age 22   TUBAL LIGATION      Current Medications: Current Meds  Medication Sig   aspirin  EC 81 MG tablet Take 81 mg by mouth daily.   atorvastatin  (LIPITOR) 10 MG tablet TAKE 1 TABLET EVERY DAY   Cholecalciferol (VITAMIN D3) 25 MCG (1000 UT) CAPS Take 1 capsule (1,000 Units total) by mouth daily.   Ferrous Sulfate  (IRON) 325 (65 Fe) MG TABS Take 1 tablet by mouth daily.   folic acid  (FOLVITE ) 400 MCG tablet Take 800 mcg by mouth daily.   lamoTRIgine  (LAMICTAL ) 25 MG tablet Take 1 tablet (25 mg total) by mouth daily.   memantine  (NAMENDA  XR) 21 MG CP24 24 hr capsule Take 1 capsule (21 mg total) by mouth daily.   sertraline  (ZOLOFT ) 100 MG tablet Take 1 tablet (100 mg total) by mouth daily. Take with 50mg  tablet daily   sertraline  (ZOLOFT ) 25 MG tablet Take 1 tablet (25 mg total) by mouth daily.   sertraline  (ZOLOFT ) 50 MG tablet Take 1 tablet (50 mg total) by mouth daily.   tacrolimus  (PROGRAF ) 0.5 MG capsule Take 0.5 mg by mouth 2 (two) times daily. 6 by mouth daily NAME BRAND ONLY     Allergies:   Codeine, Other, Penicillins, Phenergan  [promethazine ], Sulfa antibiotics, Tamsulosin , and Lidocaine    Social History   Socioeconomic History   Marital status: Divorced    Spouse name: Not on file   Number of children: 2   Years of education: 14   Highest education level:  Associate degree: occupational, Scientist, product/process development, or vocational program  Occupational History   Occupation: Disabled  Tobacco Use   Smoking status: Never   Smokeless tobacco: Never  Vaping Use   Vaping status: Never Used  Substance and Sexual Activity   Alcohol use: No   Drug use: No   Sexual activity: Never    Comment: 14 years ago  Other Topics Concern   Not on file  Social History Narrative   Lives at alone alone.   Right-handed.   No caffeine use.      PSC- as of 10/12/17   Diet: No [employer]       Caffeine: Summer time, sweet tea  Married, if yes what year: Divorced       Do you live in a house, apartment, assisted living, condo, trailer, ect: [place] [facility], 1 stories, and 1 person       Pets: 1 dog       Current/Past profession: Dentist       Exercise: Some, walking dog       Living Will: yes   DNR: yes   POA/HPOA: yes      Functional Status: Patient did not answer   Do you have difficulty bathing or dressing yourself?   Do you have difficulty preparing food or eating?   Do you have difficulty managing your medications?   Do you have difficulty managing your finances?   Do you have difficulty affording your medications?   Social Drivers of Corporate investment banker Strain: Low Risk  (12/30/2023)   Overall Financial Resource Strain (CARDIA)    Difficulty of Paying Living Expenses: Not hard at all  Food Insecurity: No Food Insecurity (12/30/2023)   Hunger Vital Sign    Worried About Running Out of Food in the Last Year: Never true    Ran Out of Food in the Last Year: Never true  Transportation Needs: No Transportation Needs (12/30/2023)   PRAPARE - Administrator, Civil Service (Medical): No    Lack of Transportation (Non-Medical): No  Physical Activity: Insufficiently Active (12/30/2023)   Exercise Vital Sign    Days of Exercise per Week: 5 days    Minutes of Exercise per Session: 10 min  Stress: Stress Concern Present (12/30/2023)    [employer] of Occupational Health - Occupational Stress Questionnaire    Feeling of Stress: To some extent  Social Connections: Moderately Isolated (12/30/2023)   Social Connection and Isolation Panel    Frequency of Communication with Friends and Family: More than three times a week    Frequency of Social Gatherings with Friends and Family: Twice a week    Attends Religious Services: Never    Database administrator or Organizations: Yes    Attends Banker Meetings: Never    Marital Status: Divorced     Family History: The patient's family history includes Alcoholism in her father and son; Alzheimer's disease in her mother; Arthritis in her mother; Bipolar disorder in her granddaughter; COPD in her brother and sister; Cancer in her mother; Depression in her sister; Diabetes in her mother; OCD in her daughter; Personality disorder in her daughter; Schizophrenia in her granddaughter; Stroke in her mother; Suicidality in her sister. She was adopted. ROS:   Please see the history of present illness.    All 14 point review of systems negative except as described per history of present illness  EKGs/Labs/Other Studies Reviewed:         Recent Labs: 01/25/2023: TSH 3.01 09/10/2023: ALT 22; BUN 16; Creatinine, Ser 1.54; Hemoglobin 11.8; Platelets 155; Potassium 5.1; Sodium 138  Recent Lipid Panel    Component Value Date/Time   CHOL 161 01/25/2023 1445   TRIG 132.0 01/25/2023 1445   HDL 52.90 01/25/2023 1445   CHOLHDL 3 01/25/2023 1445   VLDL 26.4 01/25/2023 1445   LDLCALC 82 01/25/2023 1445   LDLCALC 139 (H) 02/06/2020 0937    Physical Exam:    VS:  BP 110/68 (BP Location: Left Arm, Patient Position: Sitting)   Pulse 75   Ht 5' 3 (1.6 m)   Wt 127 lb (57.6 kg)   SpO2 94%  BMI 22.50 kg/m     Wt Readings from Last 3 Encounters:  01/11/24 127 lb (57.6 kg)  01/05/24 126 lb 12.8 oz (57.5 kg)  12/30/23 125 lb (56.7 kg)     GEN:  Well nourished, well developed  in no acute distress HEENT: Normal NECK: No JVD; No carotid bruits LYMPHATICS: No lymphadenopathy CARDIAC: RRR, no murmurs, no rubs, no gallops RESPIRATORY:  Clear to auscultation without rales, wheezing or rhonchi  ABDOMEN: Soft, non-tender, non-distended MUSCULOSKELETAL:  No edema; No deformity  SKIN: Warm and dry LOWER EXTREMITIES: no swelling NEUROLOGIC:  Alert and oriented x 3 PSYCHIATRIC:  Normal affect   ASSESSMENT:    1. Elevated coronary artery calcium  score 273 which is 76th percentile   2. Essential hypertension   3. Mixed hyperlipidemia    PLAN:    In order of problems listed above:  Elevated calcium  score we had a long discussion about that.  She is taking aspirin  which I continue.  The key will be to check her fasting lipid profile as well as LP(a) to decide about how aggressive we need to be with management of this problem.  Her blood pressure is well-controlled continue present management, mixed dyslipidemia last results I have is from summer of last year LDL 82 HDL 52.  She is on statin but the question is if we need to upgrade the dose of the statin it will depend on results of her cholesterol   Medication Adjustments/Labs and Tests Ordered: Current medicines are reviewed at length with the patient today.  Concerns regarding medicines are outlined above.  Orders Placed This Encounter  Procedures   ALT   AST   Lipid panel   Lipoprotein A (LPA)   Medication changes: No orders of the defined types were placed in this encounter.   Signed, Lamar DOROTHA Fitch, MD, [facility] 01/11/2024 4:39 PM    [facility]

## 2024-01-17 DIAGNOSIS — Z944 Liver transplant status: Principal | ICD-10-CM

## 2024-01-17 DIAGNOSIS — E612 Magnesium deficiency: Principal | ICD-10-CM

## 2024-01-17 DIAGNOSIS — Z5181 Encounter for therapeutic drug level monitoring: Principal | ICD-10-CM

## 2024-01-20 ENCOUNTER — Telehealth: Payer: Self-pay | Admitting: Internal Medicine

## 2024-01-20 ENCOUNTER — Other Ambulatory Visit: Payer: Self-pay

## 2024-01-20 DIAGNOSIS — F331 Major depressive disorder, recurrent, moderate: Secondary | ICD-10-CM

## 2024-01-20 DIAGNOSIS — F411 Generalized anxiety disorder: Secondary | ICD-10-CM

## 2024-01-20 DIAGNOSIS — F0280 Dementia in other diseases classified elsewhere without behavioral disturbance: Secondary | ICD-10-CM

## 2024-01-20 NOTE — Telephone Encounter (Signed)
 Copied from CRM [id number]. Topic: General - Other >> Jan 20, 2024  8:48 AM Candace Hill wrote: Reason for CRM: patient called stating a guy called her at 5:58pm on 8/6 stating she was not covered by her insurance anymore because she refused to do a mouth swab for the provider so [employer] was denying her health insurance. Patient stated that the guy stated he was from [facility] [place] and the caller ID said [place]. Patient wanted to know how did the guy know she had united healthcare insurance. The patient called the after hours and they stated they would send a message to the office and also wanted the patient to call the office. The patient stated the guy called when the office was closed. The patient is very upset about this. CB [phone]

## 2024-01-20 NOTE — Telephone Encounter (Signed)
 I called and discussed with the patient about the scam called she became upset with me due to the call and I reassured her that everything was okay and to try not to answer anymore scam calls. She gave a verbal understanding.

## 2024-01-21 ENCOUNTER — Telehealth: Payer: Self-pay | Admitting: *Deleted

## 2024-01-21 NOTE — Telephone Encounter (Signed)
 Copied from CRM #[id number]. Topic: General - Other >> Jan 21, 2024  8:49 AM Franky GRADE wrote: Reason for CRM: Patient's daughter April is returning a call she received from [facility].

## 2024-01-21 NOTE — Telephone Encounter (Signed)
 Spoke with patient. We discussed somebody from [facility] calling her I advised her to not give them a returned call and call her daughter so that her daughter can speak with them. She gave a verbal understanding.

## 2024-01-21 NOTE — Telephone Encounter (Signed)
 Copied from CRM [id number]. Topic: General - Other >> Jan 21, 2024 11:18 AM Deaijah H wrote: Reason for CRM: Patient called in to provide more information to [facility] in reference to phone call from office. Please call [phone]

## 2024-01-21 NOTE — Progress Notes (Signed)
 Complex Care Management Note  Care Guide Note 01/21/2024 Name: Maecie Sevcik MRN: [id number] DOB: 1946/02/08  Day Greb is a 78 y.o. year old female who sees Joshua Debby CROME, MD for primary care. I reached out to daughter April by phone today to offer complex care management services.  Ms. Pierro was given information about Complex Care Management services today including:   The Complex Care Management services include support from the care team which includes your Nurse Care Manager, Clinical Social Worker, or Pharmacist.  The Complex Care Management team is here to help remove barriers to the health concerns and goals most important to you. Complex Care Management services are voluntary, and the patient may decline or stop services at any time by request to their care team member.   Complex Care Management Consent Status: Patient agreed to services and verbal consent obtained.   Follow up plan:  Telephone appointment with complex care management team member scheduled for:  01/25/2024  Encounter Outcome:  Patient Scheduled  Thedford Franks, CMA [place]  [facility], [facility] Guide Direct Dial: [phone]  Fax: [phone] Website: [email or web address]

## 2024-01-21 NOTE — Telephone Encounter (Signed)
 Copied from CRM [id number]. Topic: General - Call Back - No Documentation >> Jan 21, 2024 10:21 AM Viola F wrote: Reason for CRM: Patient returned Jazuniques phone call, says it's very important and she can be called at  [phone] (M)

## 2024-01-24 ENCOUNTER — Ambulatory Visit (HOSPITAL_COMMUNITY): Admitting: Psychiatry

## 2024-01-24 DIAGNOSIS — Z5181 Encounter for therapeutic drug level monitoring: Principal | ICD-10-CM

## 2024-01-24 DIAGNOSIS — Z944 Liver transplant status: Principal | ICD-10-CM

## 2024-01-24 DIAGNOSIS — E612 Magnesium deficiency: Principal | ICD-10-CM

## 2024-01-25 ENCOUNTER — Other Ambulatory Visit: Payer: Self-pay | Admitting: Licensed Clinical Social Worker

## 2024-01-26 ENCOUNTER — Encounter: Payer: Self-pay | Admitting: Physician Assistant

## 2024-01-26 ENCOUNTER — Ambulatory Visit: Admitting: Physician Assistant

## 2024-01-26 VITALS — BP 123/73

## 2024-01-26 DIAGNOSIS — L578 Other skin changes due to chronic exposure to nonionizing radiation: Secondary | ICD-10-CM

## 2024-01-26 DIAGNOSIS — D229 Melanocytic nevi, unspecified: Secondary | ICD-10-CM

## 2024-01-26 DIAGNOSIS — W908XXA Exposure to other nonionizing radiation, initial encounter: Secondary | ICD-10-CM

## 2024-01-26 DIAGNOSIS — D849 Immunodeficiency, unspecified: Secondary | ICD-10-CM | POA: Diagnosis not present

## 2024-01-26 DIAGNOSIS — L814 Other melanin hyperpigmentation: Secondary | ICD-10-CM | POA: Diagnosis not present

## 2024-01-26 DIAGNOSIS — D1801 Hemangioma of skin and subcutaneous tissue: Secondary | ICD-10-CM

## 2024-01-26 DIAGNOSIS — L821 Other seborrheic keratosis: Secondary | ICD-10-CM

## 2024-01-26 DIAGNOSIS — Z1283 Encounter for screening for malignant neoplasm of skin: Secondary | ICD-10-CM

## 2024-01-26 NOTE — Patient Instructions (Signed)
 Visit Information  Thank you for taking time to visit with me today. Please don't hesitate to contact me if I can be of assistance to you before our next scheduled appointment.  Our next appointment is by telephone on 02/09/24 at 11 Please call the care guide team at [phone] if you need to cancel or reschedule your appointment.   Following is a copy of your care plan:   Goals Addressed             This Visit's Progress    VBCI Social Work Care Plan       Problems:   Care Coordination needs related to Dementia: Dementia with anxiety  CSW Clinical Goal(s):   Over the next 90 days the patient/family will decrease symptoms of caregiver strain by enhancing safety in the home, promoting social interaction, managing chronic diseases and utilizing community resources.   Interventions: Current level of care: Home with other family or significant other(s): Pt lives alone and DOES not want placement.  Evaluation of patient safety in current living environment and review of Dementia resources and support  Assessed needs, level of care concerns, how currently meeting needs and barriers to care Community Alternative Program (CAP) Pt unable to qualify for Medicaid as her SS is 3,000 per month.  Discuss community support options (Adult Day Programs) Discussed private pay options for personal care needs (Private Pay Caregivers) DSS in-home aide program:(Wait list) Long-Term Care Insurance (Family do not want to consider any out of pocket expenses at this time in order to increase home support) In last PCP visit it was noted that patient her Psychiatric/Behavioral symptoms: Positive for confusion, decreased concentration and dysphoric mood. The patient is nervous/anxious. [facility] psychiatry involved. Patient is being seen regularly.   Social Determinants of Health in Patient with  SDOH assessments completed: Alcohol/Substance Use, Depression  , Financial Strain , Geophysicist/field seismologist , Housing ,  Intimate Partner Violence, Physical Activity, Social Connections, Stress, Tobacco Use, and Transportation Evaluation of current treatment plan related to unmet needs- Family was advised to consider gaining additional support in the home to increase patient safety.   Patient Goals/Self-Care Activities: Increase self-care, Consider Adult Day Programs, Attend all Medical Appointments    Plan: VBCI LCSW will follow up within one month        Please call the Suicide and Crisis Lifeline: 988 go to [facility] [address], [place] [phone]) call 911 if you are experiencing a Mental Health or Behavioral Health Crisis or need someone to talk to.  Patient verbalizes understanding of instructions and care plan provided today and agrees to view in MyChart. Active MyChart status and patient understanding of how to access instructions and care plan via MyChart confirmed with patient.     Lyle Rung, BSW, MSW, LCSW Licensed Clinical Social Worker [employer]   [facility] [email or web address] Direct Dial: [phone]

## 2024-01-26 NOTE — Patient Outreach (Signed)
 Complex Care Management   Visit Note  01/25/2024  Name:  Candace Hill MRN: [id number] DOB: September 12, 1945  Situation: Referral received for Complex Care Management related to Mental/Behavioral Health diagnosis GAD and MDD. I obtained verbal consent from Caregiver POA.  Visit completed with daughter, April  on the phone  Background:   Past Medical History:  Diagnosis Date   Abdominal pain    Acute cystitis without hematuria 03/12/2022   Allergic rhinitis 11/14/2012   Amblyopia of eye, left 06/17/2017   Asbestos exposure 04/08/2018   Asthma    8005-8005 due to black mold house, no asthma now   Atherosclerosis of aorta 01/28/2023   Broken ankle    right   Broken rib    x2   Cataract    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 03/26/2011   Chronic hepatitis C virus infection 05/31/2013   Complication of anesthesia    IV phenergan   serious mental reaction   Delusional thoughts 03/18/2022   Diarrhea 03/13/2022   Dysuria 07/29/2023   Essential hypertension 10/14/2017   Gastroesophageal reflux disease 10/14/2017   Generalized anxiety disorder    Hernia, femoral    left   History of blood transfusion 1971   acquired hepatitis c   History of kidney stones    History of liver transplant 11/14/2012   History of nephrolithiasis 10/14/2017   History of shingles    Hyperglycemia 04/08/2018   Hyperlipidemia 01/15/2021   The 10-year ASCVD risk score (Arnett DK, et al., 2019) is: 16.2%    Values used to calculate the score:      Age: 78 years      Sex: Female      Is Non-Hispanic African American: No      Diabetic: No      Tobacco smoker: No      Systolic Blood Pressure: 136 mmHg      Is BP treated: No      HDL Cholesterol: 51.2 mg/dL      Total Cholesterol: 202 mg/dL     Intrinsic eczema 91/96/7977   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   Major depressive disorder 11/14/2012   Malignant neoplasm of liver 11/14/2012   Migraine 07/02/2015   Mixed  dementia 09/24/2023   vascular/medical; concerns for concurrent Alzheimer's disease   Overweight (BMI 25.0-29.9) 06/28/2015   PCO (posterior capsular opacification), right 03/26/2011   Seasonal affective disorder    Splenic artery aneurysm    Stage 4 chronic kidney disease 07/29/2023   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-will get Avastin  injections off/on   Vitamin D  deficiency 04/08/2018   Weight loss, non-intentional 03/12/2022    Assessment: Patient Reported Symptoms:  Cognitive Cognitive Status: Requires Assistance Decision Making Cognitive/Intellectual Conditions Management [RPT]: Behavior Disorders      Neurological Neurological Review of Symptoms: Weakness Neurological Management Strategies: Medication therapy, Routine screening Neurological Self-Management Outcome: 3 (uncertain)  HEENT HEENT Symptoms Reported: No symptoms reported      Psychosocial Psychosocial Symptoms Reported: Depression - if selected complete PHQ 2-9 Behavioral Management Strategies: Medication therapy Behavioral Health Self-Management Outcome: 3 (uncertain)   Quality of Family Relationships: helpful, involved, supportive Do you feel physically threatened by others?: No      01/26/2024    9:02 AM  Depression screen PHQ 2/9  Decreased Interest 2  Down, Depressed,  Hopeless 2  PHQ - 2 Score 4  Altered sleeping 0  Tired, decreased energy 2  Change in appetite 2  Feeling bad or failure about yourself  1  Trouble concentrating 3  Moving slowly or fidgety/restless 0  Suicidal thoughts 0  PHQ-9 Score 12  Difficult doing work/chores Somewhat difficult      07/17/2022    3:29 PM  GAD 7 : Generalized Anxiety Score  Nervous, Anxious, on Edge 3  Control/stop worrying 3  Worry too much - different things 3  Trouble relaxing 3  Restless 3  Easily annoyed or irritable 2  Afraid - awful might happen 3   Total GAD 7 Score 20  Anxiety Difficulty Very difficult    SDOH Screenings   Food Insecurity: No Food Insecurity (01/25/2024)  Housing: Unknown (01/25/2024)  Transportation Needs: No Transportation Needs (12/30/2023)  Utilities: Not At Risk (12/30/2023)  Alcohol Screen: Low Risk  (12/30/2023)  Depression (PHQ2-9): High Risk (01/26/2024)  Financial Resource Strain: Low Risk  (12/30/2023)  Physical Activity: Insufficiently Active (12/30/2023)  Social Connections: Moderately Isolated (12/30/2023)  Stress: Stress Concern Present (01/26/2024)  Tobacco Use: Low Risk  (01/11/2024)  Health Literacy: Inadequate Health Literacy (12/30/2023)    There were no vitals filed for this visit.  Medications Reviewed Today     Reviewed by Merlynn Lyle CROME, LCSW (Social Worker) on 01/26/24 at 0901  Med List Status: <None>   Medication Order Taking? Sig Documenting Provider Last Dose Status Informant  aspirin  EC 81 MG tablet [id number]  Take 81 mg by mouth daily. [provider]  Active Self  atorvastatin  (LIPITOR) 10 MG tablet [id number]  TAKE 1 TABLET EVERY DAY Joshua Debby CROME, MD  Active   Cholecalciferol (VITAMIN D3) 25 MCG (1000 UT) CAPS [id number]  Take 1 capsule (1,000 Units total) by mouth daily. Joshua Debby CROME, MD  Active   Ferrous Sulfate  (IRON) 325 (65 Fe) MG TABS [id number]  Take 1 tablet by mouth daily. [provider]  Active Self  folic acid  (FOLVITE ) 400 MCG tablet [id number]  Take 800 mcg by mouth daily. [provider]  Active Self  lamoTRIgine  (LAMICTAL ) 25 MG tablet [id number]  Take 1 tablet (25 mg total) by mouth daily. Izella Ismael NOVAK, MD  Active   memantine  (NAMENDA  XR) 21 MG CP24 24 hr capsule [id number]  Take 1 capsule (21 mg total) by mouth daily. Joshua Debby CROME, MD  Active   sertraline  (ZOLOFT ) 100 MG tablet [id number]  Take 1 tablet (100 mg total) by mouth daily. Take with 50mg  tablet daily Hoang, Daniela B, MD  Active   sertraline  (ZOLOFT ) 25 MG tablet [id number]   Take 1 tablet (25 mg total) by mouth daily. Izella Ismael NOVAK, MD  Active   sertraline  (ZOLOFT ) 50 MG tablet [id number]  Take 1 tablet (50 mg total) by mouth daily. Izella Ismael NOVAK, MD  Active   tacrolimus  (PROGRAF ) 0.5 MG capsule [id number]  Take 0.5 mg by mouth 2 (two) times daily. 6 by mouth daily NAME BRAND ONLY [provider]  Active Self            Recommendation:   PCP Follow-up Continue Current Plan of Care  Follow Up Plan:   Telephone follow-up in 1 month  Lyle Merlynn, BSW, MSW, LCSW Licensed Clinical Social Worker [employer]   [email or web address] Direct Dial: [phone]

## 2024-01-26 NOTE — Progress Notes (Signed)
   New Patient Visit   Subjective  Candace Hill is a 78 y.o. NEW PATIENT female who presents for the following: Pt states she has some bumps all over her body she wants looked at. Pt states she worries about skin cancer. She washes with dove soap. Pt uses whatever lotion that she has. Pt is not sure about family skin cancer history due to being adopted.  Patient is s/p liver transplant in 2009 and is currently on Prograf . She sees [facility] once yearly. Has never had a full skin exam.   Accompanied by daughter    The following portions of the chart were reviewed this encounter and updated as appropriate: medications, allergies, medical history  Review of Systems:  No other skin or systemic complaints except as noted in HPI or Assessment and Plan.  Objective  Well appearing patient in no apparent distress; mood and affect are within normal limits.  A full examination was performed including scalp, head, eyes, ears, nose, lips, neck, chest, axillae, abdomen, back, buttocks, bilateral upper extremities, bilateral lower extremities, hands, feet, fingers, toes, fingernails, and toenails. All findings within normal limits unless otherwise noted below.   Relevant exam findings are noted in the Assessment and Plan.    Assessment & Plan  LENTIGINES, SEBORRHEIC KERATOSES, HEMANGIOMAS - Benign normal skin lesions - Benign-appearing - Call for any changes  MELANOCYTIC NEVI - Tan-brown and/or pink-flesh-colored symmetric macules and papules - Benign appearing on exam today - Observation - Call clinic for new or changing moles - Recommend daily use of broad spectrum spf 30+ sunscreen to sun-exposed areas.   ACTINIC DAMAGE - Chronic condition, secondary to cumulative UV/sun exposure - diffuse scaly erythematous macules with underlying dyspigmentation - Recommend daily broad spectrum sunscreen SPF 30+ to sun-exposed areas, reapply every 2 hours as needed.  - Staying in the shade or wearing long  sleeves, sun glasses (UVA+UVB protection) and wide brim hats (4-inch brim around the entire circumference of the hat) are also recommended for sun protection.  - Call for new or changing lesions.  IMMUNOSUPPRESSION  - Recommend routine skin evaluations and call if concerning lesion arises.   SKIN CANCER SCREENING PERFORMED TODAY    SCREENING EXAM FOR SKIN CANCER   LENTIGINES   SEBORRHEIC KERATOSIS   CHERRY ANGIOMA   ACTINIC SKIN DAMAGE   MULTIPLE BENIGN NEVI   IMMUNOSUPPRESSION (HCC)    Return in about 1 year (around 01/25/2025) for TBSE.  I, Candace Hill, CMA, am acting as scribe for Candace Damas K, PA-C.   Documentation: I have reviewed the above documentation for accuracy and completeness, and I agree with the above.  Candace Hill K, PA-C

## 2024-01-26 NOTE — Patient Instructions (Signed)

## 2024-01-31 ENCOUNTER — Other Ambulatory Visit: Payer: Self-pay | Admitting: Internal Medicine

## 2024-01-31 ENCOUNTER — Telehealth (HOSPITAL_COMMUNITY): Payer: Self-pay | Admitting: Psychiatry

## 2024-01-31 DIAGNOSIS — F01B4 Vascular dementia, moderate, with anxiety: Secondary | ICD-10-CM

## 2024-01-31 DIAGNOSIS — Z5181 Encounter for therapeutic drug level monitoring: Principal | ICD-10-CM

## 2024-01-31 DIAGNOSIS — Z944 Liver transplant status: Principal | ICD-10-CM

## 2024-01-31 DIAGNOSIS — E612 Magnesium deficiency: Principal | ICD-10-CM

## 2024-01-31 NOTE — Unmapped (Signed)
 [facility] Refill Coordination Note    Specialty Medication(s) to be Shipped:   Transplant: tacrolimus  0.5 mg    Other medication(s) to be shipped: No additional medications requested for fill at this time    Specialty Medications not needed at this time: N/A     Kristy Hanson, DOB: 02-11-1946  Phone: [phone] (home)       All above HIPAA information was verified with patient.     Was a Nurse, learning disability used for this call? No    Completed refill call assessment today to schedule patient's medication shipment from the [facility]  [phone]).  All relevant notes have been reviewed.     Specialty medication(s) and dose(s) confirmed: Regimen is correct and unchanged.   Changes to medications: Kristy Hanson reports no changes at this time.  Changes to insurance: No  New side effects reported not previously addressed with a pharmacist or physician: None reported  Questions for the pharmacist: No    Confirmed patient received a Conservation officer, historic buildings and a Surveyor, mining with first shipment. The patient will receive a drug information handout for each medication shipped and additional FDA Medication Guides as required.       DISEASE/MEDICATION-SPECIFIC INFORMATION        N/A    SPECIALTY MEDICATION ADHERENCE     Medication Adherence    Patient reported X missed doses in the last month: 0  Specialty Medication: tacrolimus  0.5 MG capsule (PROGRAF )  Patient is on additional specialty medications: No              Were doses missed due to medication being on hold? No    tacrolimus  0.5 MG capsule (PROGRAF )  10 days of medicine on hand       REFERRAL TO PHARMACIST     Referral to the pharmacist: Not needed      [facility]     Shipping address confirmed in Epic.     Cost and Payment: Patient has a copay of $47.00. They are aware and have authorized the pharmacy to charge the credit card on file.    Delivery Scheduled: Yes, Expected medication delivery date: 02/04/2024. Medication will be delivered via UPS to the prescription address in Epic WAM.    [facility]  Specialty Technician

## 2024-02-02 ENCOUNTER — Ambulatory Visit (HOSPITAL_COMMUNITY): Admitting: Psychiatry

## 2024-02-02 ENCOUNTER — Other Ambulatory Visit: Payer: Self-pay

## 2024-02-02 NOTE — Patient Instructions (Signed)
 Visit Information  Thank you for taking time to visit with me today. Please don't hesitate to contact me if I can be of assistance to you before our next scheduled appointment.  Our next appointment is by telephone on 02/16/2024 at 11AM Please call the care guide team at [phone] if you need to cancel or reschedule your appointment.   Following is a copy of your care plan:   Goals Addressed             This Visit's Progress    VBCI Social Work Care Plan       Problems:   No SDOH needs at this time. Daughter is looking for in-home support services for patient who was recently diagnosed with mixed dementia.   CSW Clinical Goal(s):   Over the next 2 weeks the Patient will work with Child psychotherapist to address concerns related to in-home support services for patient. .  Interventions:  BSW will provide patient daughter dementia resources via email.   Patient Goals/Self-Care Activities:  None  Plan:   Telephone follow up appointment with care management team member scheduled for:  02/16/2024 at 11AM.         Please call the Suicide and Crisis Lifeline: 988 go to [facility] [address], [place] [phone]) call 911 if you are experiencing a Mental Health or Behavioral Health Crisis or need someone to talk to.  Patient verbalizes understanding of instructions and care plan provided today and agrees to view in MyChart. Active MyChart status and patient understanding of how to access instructions and care plan via MyChart confirmed with patient.     Laymon Doll, BSW [place]/VBCI - [employer] Social Worker [phone]

## 2024-02-02 NOTE — Patient Outreach (Signed)
 Complex Care Management   Visit Note  02/02/2024  Name:  Candace Hill MRN: [id number] DOB: 02-Mar-1946  Situation: Referral received for Complex Care Management related to dementia resources for patient I obtained verbal consent from daughter.  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   Asbestos exposure 04/08/2018   Asthma    8005-8005 due to black mold house, no asthma now   Atherosclerosis of aorta 01/28/2023   Broken ankle    right   Broken rib    x2   Cataract    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 03/26/2011   Chronic hepatitis C virus infection 05/31/2013   Complication of anesthesia    IV phenergan   serious mental reaction   Delusional thoughts 03/18/2022   Diarrhea 03/13/2022   Dysuria 07/29/2023   Essential hypertension 10/14/2017   Gastroesophageal reflux disease 10/14/2017   Generalized anxiety disorder    Hernia, femoral    left   History of blood transfusion 1971   acquired hepatitis c   History of kidney stones    History of liver transplant 11/14/2012   History of nephrolithiasis 10/14/2017   History of shingles    Hyperglycemia 04/08/2018   Hyperlipidemia 01/15/2021   The 10-year ASCVD risk score (Arnett DK, et al., 2019) is: 16.2%    Values used to calculate the score:      Age: 78 years      Sex: Female      Is Non-Hispanic African American: No      Diabetic: No      Tobacco smoker: No      Systolic Blood Pressure: 136 mmHg      Is BP treated: No      HDL Cholesterol: 51.2 mg/dL      Total Cholesterol: 202 mg/dL     Intrinsic eczema 91/96/7977   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   Major depressive disorder 11/14/2012   Malignant neoplasm of liver 11/14/2012   Migraine 07/02/2015   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 No SDOH needs were identified. Patient daughter is requesting information/resources for in-home support services for patient who was recently diagnosed with mixed dementia. Daughter provides everything patient needs and makes sure she has housing, food, and transportation to her doctor appts. Patient cannot afford nursing facility and does not want to move in with daughter. BSW will provide patient daughter demential support resources via email and f/u up on next appointment. Patient daughter understood and was thankful for any information being provided. No other resources were provided/requested at this time.  SABRA SDOH Interventions    Flowsheet Row Patient Outreach Telephone from 01/25/2024 in [place] POPULATION HEALTH DEPARTMENT Clinical  Support from 12/30/2023 in [facility] at [facility] Coordination from 04/30/2023 in [facility] Community Care Coordination Care Coordination from 03/29/2023 in [facility] [facility] Care Coordination Clinical Support from 12/24/2022 in [facility] at [facility] Chronic Care Management from 04/14/2021 in [facility] at [place]  SDOH Interventions        Food Insecurity Interventions Intervention Not Indicated Intervention Not Indicated Intervention Not Indicated Intervention Not Indicated  Intervention Not Indicated --  Housing Interventions Intervention Not Indicated Intervention Not Indicated -- Intervention Not Indicated Intervention Not Indicated --  Transportation Interventions -- Intervention Not Indicated Intervention Not Indicated -- Intervention Not Indicated --  Utilities Interventions -- Intervention Not Indicated Intervention Not Indicated Intervention Not Indicated Intervention Not Indicated --  Alcohol Usage Interventions -- Intervention Not Indicated (Score <7) -- -- Intervention Not Indicated (Score <7) --  Depression Interventions/Treatment  Medication, Currently on Treatment Medication -- -- -- Counseling  Financial Strain Interventions -- Intervention Not Indicated -- -- Intervention Not Indicated --  Physical Activity Interventions -- Intervention Not Indicated -- -- Intervention Not Indicated --  Stress Interventions Community Resources Provided, Provide Counseling Intervention Not Indicated -- -- Intervention Not Indicated --  Social Connections Interventions -- Intervention Not Indicated -- -- Intervention Not Indicated --  Health Literacy Interventions -- Intervention Not Indicated -- -- Intervention Not Indicated --      Recommendation:   Review dementia resources BSW will be sharing via email.   Follow Up Plan:   Telephone follow up appointment date/time:  02/16/2024 at 11AM.  Laymon Doll, BSW [place]/VBCI - [facility] Social Worker [phone]

## 2024-02-03 MED FILL — TACROLIMUS 0.5 MG CAPSULE, IMMEDIATE-RELEASE: ORAL | 30 days supply | Qty: 180 | Fill #3

## 2024-02-07 ENCOUNTER — Ambulatory Visit (HOSPITAL_COMMUNITY): Admitting: Psychiatry

## 2024-02-07 DIAGNOSIS — E612 Magnesium deficiency: Principal | ICD-10-CM

## 2024-02-07 DIAGNOSIS — Z944 Liver transplant status: Principal | ICD-10-CM

## 2024-02-07 DIAGNOSIS — Z5181 Encounter for therapeutic drug level monitoring: Principal | ICD-10-CM

## 2024-02-09 ENCOUNTER — Other Ambulatory Visit: Payer: Self-pay | Admitting: Licensed Clinical Social Worker

## 2024-02-09 ENCOUNTER — Ambulatory Visit: Payer: Self-pay

## 2024-02-09 NOTE — Telephone Encounter (Signed)
 FYI Only or Action Required?: Action required by provider: would like to switch pcp to Dr. Charlanne.  Patient was last seen in primary care on 01/05/2024 by Joshua Debby CROME, MD.  Called Nurse Triage reporting Hypertension.  Symptoms began a week ago.  Interventions attempted: Nothing.  Symptoms are: gradually worsening.  Triage Disposition: No disposition on file.  Patient/caregiver understands and will follow disposition?: Yes            Copied from CRM [id number]. Topic: Clinical - Red Word Triage >> Feb 09, 2024 12:03 PM Candace Hill wrote: Red Word that prompted transfer to Nurse Triage: Patient been seen before on [facility]/Dr. Debby Layer April would like her to become Pt for Dr. Charlanne but PT is having issues with BP high 08/26 150/90 huel /Alzheimer's/Not eating very much Candace Hill tired. Answer Assessment - Initial Assessment Questions 1. BLOOD PRESSURE: What is your blood pressure? Did you take at least two measurements 5 minutes apart?     150/90 yesterday 2. ONSET: When did you take your blood pressure?     Yesterday at home 3. HOW: How did you take your blood pressure? (e.g., automatic home BP monitor, visiting nurse)     Battery operated cuff 4. HISTORY: Do you have a history of high blood pressure?     Yes, hx orthostatic bp HTN 5. MEDICINES: Are you taking any medicines for blood pressure? Have you missed any doses recently?     No was taking meds but was taken off of them. 6. OTHER SYMPTOMS: Do you have any symptoms? (e.g., blurred vision, chest pain, difficulty breathing, headache, weakness)     Not eating or drinking much, hx alzheimers, fatigue, sleeping all the time, weak, states declining a lot starting this last week. 7. PREGNANCY: Is there any chance you are pregnant? When was your last menstrual period?     na  Protocols used: Blood Pressure - High-A-AH

## 2024-02-09 NOTE — Patient Instructions (Signed)
 Visit Information  Thank you for taking time to visit with me today. Please don't hesitate to contact me if I can be of assistance to you before our next scheduled appointment.  Our next appointment is by telephone on 03/08/24 at 11 Please call the care guide team at [phone] if you need to cancel or reschedule your appointment.   Following is a copy of your care plan:   Goals Addressed             This Visit's Progress    LCSW VBCI Social Work Care Plan       Problems:   Care Coordination needs related to Dementia: Dementia with anxiety (Level of care concerns regarding safety in the home)  CSW Clinical Goal(s):   Over the next 90 days the patient/family will decrease symptoms of caregiver strain by enhancing safety in the home, promoting social interaction, managing chronic diseases and utilizing community resources.   Interventions: Current level of care: Home with other family or significant other(s): Pt lives alone and DOES not want placement.  Evaluation of patient safety in current living environment and review of Dementia resources and support  Assessed needs, level of care concerns, how currently meeting needs and barriers to care Community Alternative Program (CAP) Pt unable to qualify for Medicaid as her SS is 3,000 per month.  Discuss community support options (Adult Day Programs, PACE is a great option for family to consider) Discussed private pay options for personal care needs (Private Pay Caregivers) DSS in-home aide program:(Wait list) Long-Term Care Insurance (Family do not want to consider any out of pocket expenses at this time in order to increase home support) In last PCP visit it was noted that patient her Psychiatric/Behavioral symptoms: Positive for confusion, decreased concentration and dysphoric mood. The patient is nervous/anxious. [facility] psychiatry involved. Patient is being seen regularly but missed her last appointment on 02/07/24 as she told her  daughter that she was too tired to get up. Per daughter, patient is not getting out of bed. Referral placed for RNCM today on 02/09/24.  Social Determinants of Health in Patient with  SDOH assessments completed: Alcohol/Substance Use, Depression  , Financial Strain , Geophysicist/field seismologist , Housing , Intimate Partner Violence, Physical Activity, Social Connections, Stress, Tobacco Use, and Transportation Evaluation of current treatment plan related to unmet needs- Family was advised to consider gaining additional support in the home to increase patient safety (which will be out of pocket for services to start asap.   Patient Goals/Self-Care Activities: Contact PCP office regarding Level of Care concerns, Increase self-care, Consider Adult Day Programs(PACE), Attend all Medical Appointments   Plan: VBCI LCSW will follow up within one month        Please call the Suicide and Crisis Lifeline: 988 go to [facility] [address], [place] [phone]) call 911 if you are experiencing a Mental Health or Behavioral Health Crisis or need someone to talk to.  Patient verbalizes understanding of instructions and care plan provided today and agrees to view in MyChart. Active MyChart status and patient understanding of how to access instructions and care plan via MyChart confirmed with patient.     Lyle Rung, BSW, MSW, LCSW Licensed Clinical Social Worker [employer]   [facility] [email or web address] Direct Dial: [phone]

## 2024-02-09 NOTE — Patient Outreach (Signed)
 Complex Care Management   Visit Note  02/09/2024  Name:  Candace Hill MRN: [id number] DOB: 1945-10-26  Situation: Referral received for Complex Care Management related to Mental/Behavioral Health diagnosis anxiety and dementia. I obtained verbal consent from Caregiver-daughter, April.  Visit completed with Caregiver  on the phone  Background:   Past Medical History:  Diagnosis Date   Abdominal pain    Acute cystitis without hematuria 03/12/2022   Allergic rhinitis 11/14/2012   Amblyopia of eye, left 06/17/2017   Asbestos exposure 04/08/2018   Asthma    [id number] due to black mold house, no asthma now   Atherosclerosis of aorta 01/28/2023   Broken ankle    right   Broken rib    x2   Cataract    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 03/26/2011   Chronic hepatitis C virus infection 05/31/2013   Complication of anesthesia    IV phenergan   serious mental reaction   Delusional thoughts 03/18/2022   Diarrhea 03/13/2022   Dysuria 07/29/2023   Essential hypertension 10/14/2017   Gastroesophageal reflux disease 10/14/2017   Generalized anxiety disorder    Hernia, femoral    left   History of blood transfusion 1971   acquired hepatitis c   History of kidney stones    History of liver transplant 11/14/2012   History of nephrolithiasis 10/14/2017   History of shingles    Hyperglycemia 04/08/2018   Hyperlipidemia 01/15/2021   The 10-year ASCVD risk score (Arnett DK, et al., 2019) is: 16.2%    Values used to calculate the score:      Age: 78 years      Sex: Female      Is Non-Hispanic African American: No      Diabetic: No      Tobacco smoker: No      Systolic Blood Pressure: 136 mmHg      Is BP treated: No      HDL Cholesterol: 51.2 mg/dL      Total Cholesterol: 202 mg/dL     Intrinsic eczema 91/96/7977   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   Major depressive disorder 11/14/2012   Malignant neoplasm of liver 11/14/2012   Migraine  07/02/2015   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   vascular/medical; concerns for concurrent Alzheimer's disease   Overweight (BMI 25.0-29.9) 06/28/2015   PCO (posterior capsular opacification), right 03/26/2011   Seasonal affective disorder    Splenic artery aneurysm    Stage 4 chronic kidney disease 07/29/2023   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-will get Avastin  injections off/on   Vitamin D  deficiency 04/08/2018   Weight loss, non-intentional 03/12/2022    Assessment: Patient Reported Symptoms:  Cognitive Cognitive Status: Requires Assistance Decision Making, Struggling with memory recall Cognitive/Intellectual Conditions Management [RPT]: Behavior Disorders Behavior Disorders: Dementia   Health Maintenance Behaviors: Annual physical exam, Sleep adequate, Stress management Healing Pattern: Slow Health Facilitated by: Rest  Neurological Neurological Review of Symptoms: Weakness Neurological Management Strategies: Coping strategies, Medication therapy, Routine screening Neurological Self-Management Outcome: 2 (bad)  HEENT HEENT Symptoms Reported: No symptoms reported      Cardiovascular Cardiovascular Symptoms Reported: Lightheadness, Dizziness, Fatigue Does patient have uncontrolled Hypertension?: Yes Is patient checking Blood Pressure at home?: No Patient's Recent BP reading at home: Pt not compliant with DME Cardiovascular Management Strategies: Adequate rest, Coping strategies,  Routine screening Cardiovascular Self-Management Outcome: 3 (uncertain)  Endocrine Endocrine Symptoms Reported: Weakness or fatigue, Hyperglycemia Is patient diabetic?: No Is patient checking blood sugars at home?: No Endocrine Self-Management Outcome: 3 (uncertain)  Gastrointestinal Gastrointestinal Symptoms Reported: No symptoms reported      Genitourinary Genitourinary Symptoms Reported: No symptoms  reported    Integumentary Integumentary Symptoms Reported: No symptoms reported    Musculoskeletal Musculoskelatal Symptoms Reviewed: Difficulty walking, Unsteady gait, Limited mobility Musculoskeletal Management Strategies: Adequate rest, Routine screening (Pt does not use her DME suggested to her) Musculoskeletal Self-Management Outcome: 2 (bad) Falls in the past year?: No Number of falls in past year: 1 or less Was there an injury with Fall?: No Fall Risk Category Calculator: 0 Patient Fall Risk Level: Low Fall Risk    Psychosocial Psychosocial Symptoms Reported: Depression - if selected complete PHQ 2-9 Behavioral Management Strategies: Medication therapy, Counseling, Complementary therapy(ies) Behavioral Health Self-Management Outcome: 3 (uncertain) Major Change/Loss/Stressor/Fears (CP): Medical condition, self Techniques to Cope with Loss/Stress/Change: Medication Quality of Family Relationships: helpful, involved, supportive Do you feel physically threatened by others?: No    02/09/2024    PHQ2-9 Depression Screening   Little interest or pleasure in doing things More than half the days  Feeling down, depressed, or hopeless More than half the days  PHQ-2 - Total Score 4  Trouble falling or staying asleep, or sleeping too much Nearly every day  Feeling tired or having little energy Nearly every day  Poor appetite or overeating  More than half the days  Feeling bad about yourself - or that you are a failure or have let yourself or your family down Several days  Trouble concentrating on things, such as reading the newspaper or watching television Nearly every day  Moving or speaking so slowly that other people could have noticed.  Or the opposite - being so fidgety or restless that you have been moving around a lot more than usual More than half the days  Thoughts that you would be better off dead, or hurting yourself in some way Not at all  PHQ2-9 Total Score 18  If you checked  off any problems, how difficult have these problems made it for you to do your work, take care of things at home, or get along with other people    Depression Interventions/Treatment Medication, Counseling, Currently on Treatment    There were no vitals filed for this visit.  Medications Reviewed Today     Reviewed by Merlynn Lyle CROME, LCSW (Social Worker) on 02/09/24 at 1138  Med List Status: <None>   Medication Order Taking? Sig Documenting Provider Last Dose Status Informant  aspirin  EC 81 MG tablet [id number]  Take 81 mg by mouth daily. [provider]  Active Self  atorvastatin  (LIPITOR) 10 MG tablet [id number]  TAKE 1 TABLET EVERY DAY Joshua Debby CROME, MD  Active   Cholecalciferol (VITAMIN D3) 25 MCG (1000 UT) CAPS [id number]  Take 1 capsule (1,000 Units total) by mouth daily. Joshua Debby CROME, MD  Active   Ferrous Sulfate  (IRON) 325 (65 Fe) MG TABS [id number]  Take 1 tablet by mouth daily. [provider]  Active Self  folic acid  (FOLVITE ) 400 MCG tablet [id number]  Take 800 mcg by mouth daily. [provider]  Active Self  lamoTRIgine  (LAMICTAL ) 25 MG tablet [id number]  Take 1 tablet (25 mg total) by mouth daily. Izella Ismael NOVAK, MD  Active   memantine  (NAMENDA  XR) 21 MG CP24 24 hr capsule [id number]  TAKE  1 CAPSULE(21 MG) BY MOUTH DAILY Joshua Debby CROME, MD  Active   sertraline  (ZOLOFT ) 100 MG tablet [id number]  Take 1 tablet (100 mg total) by mouth daily. Take with 50mg  tablet daily Hoang, Daniela B, MD  Active   sertraline  (ZOLOFT ) 25 MG tablet [id number]  Take 1 tablet (25 mg total) by mouth daily. Izella Ismael NOVAK, MD  Active   sertraline  (ZOLOFT ) 50 MG tablet [id number]  Take 1 tablet (50 mg total) by mouth daily. Izella Ismael NOVAK, MD  Active   tacrolimus  (PROGRAF ) 0.5 MG capsule [id number]  Take 0.5 mg by mouth 2 (two) times daily. 6 by mouth daily NAME BRAND ONLY [provider]  Active Self           SDOH Screenings   Food Insecurity: No Food  Insecurity (01/25/2024)  Housing: Unknown (01/25/2024)  Transportation Needs: No Transportation Needs (02/09/2024)  Utilities: Not At Risk (12/30/2023)  Alcohol Screen: Low Risk  (12/30/2023)  Depression (PHQ2-9): High Risk (02/09/2024)  Financial Resource Strain: Low Risk  (12/30/2023)  Physical Activity: Insufficiently Active (12/30/2023)  Social Connections: Moderately Isolated (12/30/2023)  Stress: Stress Concern Present (02/09/2024)  Tobacco Use: Low Risk  (01/26/2024)  Health Literacy: Inadequate Health Literacy (12/30/2023)      02/09/2024   11:46 AM 07/17/2022    3:29 PM  GAD 7 : Generalized Anxiety Score  Nervous, Anxious, on Edge 3   Control/stop worrying 3   Worry too much - different things 3   Trouble relaxing 1   Restless 2   Easily annoyed or irritable 2   Afraid - awful might happen 3   Total GAD 7 Score 17   Anxiety Difficulty       Information is confidential and restricted. Go to Review Flowsheets to unlock data.   Recommendation:   PCP Follow-up DME requests:  walker-suggested use in order to prevent falls Continue Current Plan of Care  Follow Up Plan:   Telephone follow-up in 1 month  Lyle Rung, BSW, MSW, LCSW Licensed Clinical Social Worker [employer]   [facility] [email or web address] Direct Dial: [phone]

## 2024-02-09 NOTE — Telephone Encounter (Signed)
 Noted

## 2024-02-14 DIAGNOSIS — Z944 Liver transplant status: Principal | ICD-10-CM

## 2024-02-14 DIAGNOSIS — Z5181 Encounter for therapeutic drug level monitoring: Principal | ICD-10-CM

## 2024-02-14 DIAGNOSIS — E612 Magnesium deficiency: Principal | ICD-10-CM

## 2024-02-16 ENCOUNTER — Telehealth: Payer: Self-pay

## 2024-02-16 DIAGNOSIS — Z944 Liver transplant status: Principal | ICD-10-CM

## 2024-02-17 ENCOUNTER — Ambulatory Visit: Admit: 2024-02-17 | Discharge: 2024-02-18 | Payer: Medicare (Managed Care)

## 2024-02-17 ENCOUNTER — Encounter (HOSPITAL_COMMUNITY): Admitting: Psychiatry

## 2024-02-17 DIAGNOSIS — Z944 Liver transplant status: Secondary | ICD-10-CM | POA: Diagnosis not present

## 2024-02-17 LAB — COMPREHENSIVE METABOLIC PANEL
ALBUMIN: 4.7 g/dL (ref 3.4–5.0)
ALKALINE PHOSPHATASE: 69 U/L (ref 46–116)
ALT (SGPT): 10 U/L (ref 10–49)
ANION GAP: 13 mmol/L (ref 5–14)
AST (SGOT): 21 U/L (ref <=34)
BILIRUBIN TOTAL: 0.3 mg/dL (ref 0.3–1.2)
BLOOD UREA NITROGEN: 21 mg/dL (ref 9–23)
BUN / CREAT RATIO: 16
CALCIUM: 9.8 mg/dL (ref 8.7–10.4)
CHLORIDE: 99 mmol/L (ref 98–107)
CO2: 29 mmol/L (ref 20.0–31.0)
CREATININE: 1.33 mg/dL — ABNORMAL HIGH (ref 0.55–1.02)
EGFR CKD-EPI (2021) FEMALE: 42 mL/min/1.73m2 — ABNORMAL LOW (ref >=60)
GLUCOSE RANDOM: 93 mg/dL (ref 70–179)
POTASSIUM: 4.1 mmol/L (ref 3.4–4.8)
PROTEIN TOTAL: 7.5 g/dL (ref 5.7–8.2)
SODIUM: 141 mmol/L (ref 135–145)

## 2024-02-17 LAB — CBC W/ AUTO DIFF
BASOPHILS ABSOLUTE COUNT: 0.1 10*9/L (ref 0.0–0.1)
BASOPHILS RELATIVE PERCENT: 0.7 %
EOSINOPHILS ABSOLUTE COUNT: 0.1 10*9/L (ref 0.0–0.5)
EOSINOPHILS RELATIVE PERCENT: 1.4 %
HEMATOCRIT: 38.1 % (ref 34.0–44.0)
HEMOGLOBIN: 13.4 g/dL (ref 11.3–14.9)
LYMPHOCYTES ABSOLUTE COUNT: 1.5 10*9/L (ref 1.1–3.6)
LYMPHOCYTES RELATIVE PERCENT: 16.6 %
MEAN CORPUSCULAR HEMOGLOBIN CONC: 35.2 g/dL (ref 32.0–36.0)
MEAN CORPUSCULAR HEMOGLOBIN: 31.1 pg (ref 25.9–32.4)
MEAN CORPUSCULAR VOLUME: 88.3 fL (ref 77.6–95.7)
MEAN PLATELET VOLUME: 8 fL (ref 6.8–10.7)
MONOCYTES ABSOLUTE COUNT: 0.6 10*9/L (ref 0.3–0.8)
MONOCYTES RELATIVE PERCENT: 6.9 %
NEUTROPHILS ABSOLUTE COUNT: 6.6 10*9/L (ref 1.8–7.8)
NEUTROPHILS RELATIVE PERCENT: 74.4 %
PLATELET COUNT: 201 10*9/L (ref 150–450)
RED BLOOD CELL COUNT: 4.31 10*12/L (ref 3.95–5.13)
RED CELL DISTRIBUTION WIDTH: 14.3 % (ref 12.2–15.2)
WBC ADJUSTED: 8.9 10*9/L (ref 3.6–11.2)

## 2024-02-17 LAB — MAGNESIUM: MAGNESIUM: 2.4 mg/dL (ref 1.6–2.6)

## 2024-02-17 LAB — GAMMA GT: GAMMA GLUTAMYL TRANSFERASE: 30 U/L (ref 0–38)

## 2024-02-17 LAB — BILIRUBIN, DIRECT: BILIRUBIN DIRECT: 0.1 mg/dL (ref 0.00–0.30)

## 2024-02-17 LAB — PHOSPHORUS: PHOSPHORUS: 3.6 mg/dL (ref 2.4–5.1)

## 2024-02-17 NOTE — Unmapped (Addendum)
 [facility], [facility], [address]  [place], [place]  [address]  Ph: [phone]  Fax: [phone]  February 17, 2024 4:17 PM       Patient Care Team:  Joshua Debby CROME, MD as PCP - General  Dr. Onita as Attending Provider (Neurology)  Renda Gretel RAMAN, MD as Attending Provider (Urology)  Eliza Lonni Hamilton, MD as Surgeon  Freya Ronal Masters, CPP as Pharmacist (Pharmacy)  Richie Arthea Greenhouse, PhD as Psychologist (Physical Medicine and Rehabilitation)  Debby Leita BIRCH, RN as Transplant Coordinator (Transplant)    RE: Kristy Hanson; DOB: 03-Aug-1945      Reason for visit: Follow-up status post OLT    HPI: Ms. 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 She was last seen in hepatology clinic pre-pandemic and had Video visit with me 15 months ago.  She has had no episodes of rejection or biliary strictures. Her last biopsy was on 01/27/2016 which showed NASH stage II disease. She denies any history of hypertension or diabetes. In regards to her immunosuppression she is currently on Prograf  1.5 mg twice a day. Trough level between 3-5. In regards to her chronic hepatitis C she was noted be genotype 1B and completed 12 weeks of Harvoni and achieved SVR.   Interval history  Weight down 10 pounds since last year. Forgets to eat. Sleeping during day, staying up at night.   She is currently being followed by psychiatry and a counselor locally for depression, also seeing a Engineer, agricultural. She is currently on namenda.  Has a splenic artery aneurysm, but has been stable for year.   Today in clinic she denies any fever, chills, headache, jaundice, chest pain, upper lower GI abdomen, melena or confusion.        PMH:  Patient Active Problem List   Diagnosis    Chronic hepatitis C virus infection    (CMS-HCC)     Past Medical History:   Diagnosis Date    Blindness     left eye    Chronic pruritic rash in adult     Depression     Hepatitis C     Gemp 1b; SVR post-transplant 2015    HTN (hypertension)     Liver transplanted    (CMS-HCC) 02-17-2008    Nephrolithiasis        PSH:  Past Surgical History:   Procedure Laterality Date    LIVER TRANSPLANTATION  02-17-08    TUBAL LIGATION Bilateral        MEDICATIONS:  Current Outpatient Medications   Medication Sig Dispense Refill    amlodipine (NORVASC) 2.5 MG tablet Take 1 tablet (2.5 mg total) by mouth daily.      aspirin (ECOTRIN) 81 MG tablet Take 1 tablet (81 mg total) by mouth daily.      atorvastatin (LIPITOR) 10 MG tablet Take 1 tablet (10 mg total) by mouth daily.      biotin 1,000 mcg Chew Chew 1,000 mcg  in the morning.      ferrous sulfate 325 (65 FE) MG tablet Take 1 tablet (325 mg total) by mouth in the morning.      memantine (NAMENDA) 5 MG tablet Take 1 tablet (5 mg total) by mouth two (2) times a day.      psyllium (METAMUCIL) 3.4 gram packet Take 1 packet by mouth daily as needed (constipation).  sertraline (ZOLOFT) 100 MG tablet Take 1 tablet (100 mg total) by mouth daily.      tacrolimus  (PROGRAF ) 0.5 MG capsule Take 3 capsules (1.5 mg total) by mouth two (2) times a day. 540 capsule 3     No current facility-administered medications for this visit.       ALLERGIES:  Avastin [bevacizumab], Prednisone, Amoxicillin-pot clavulanate, Codeine, Penicillins, Procaine, Promethazine, Sulfa (sulfonamide antibiotics), Lidocaine, and Tamsulosin      SH: Patient lives [place] perhaps moving in with her daughter soon. She is divorced since 2000. She has 1 daughter.    Vitals  Vitals:    02/17/24 1413   BP: 116/72   BP Position: Sitting   Pulse: 73   Temp: 36.1 ??C (97 ??F)   TempSrc: Temporal   SpO2: 99%   Weight: 56.5 kg (124 lb 9.6 oz)   Height: 162.6 cm (5' 4)        Wt Readings from Last 6 Encounters:   02/17/24 56.5 kg (124 lb 9.6 oz)   11/26/22 60.9 kg (134 lb 3.2 oz)   03/30/17 73.3 kg (161 lb 8 oz)   03/30/17 72.6 kg (160 lb)   01/27/16 72.6 kg (160 lb) 02/22/14 75 kg (165 lb 6.4 oz)           PE  Constitutional:  In no apparent distress.   Eyes: anicteric sclerae   Cardiovascular: No peripheral edema.   Gastrointestinal: Soft, nontender abdomen without hepatosplenomegaly, hernias or masses. No ascites   Skin: No spider angiomata, palmar erythema, rashes.   Neurologic: Nonfocal, cranial nerves grossly intact, no asterixis.   Psychiatric: Alert and oriented to person, place and time. Normal affect.      LABS:      IMAGING  CLINICAL DATA:  78 year old female with history of splenic artery   aneurysm. History of prior liver transplant.     EXAM:   CTA ABDOMEN AND PELVIS WITHOUT AND WITH CONTRAST     TECHNIQUE:   Multidetector CT imaging of the abdomen and pelvis was performed   using the standard protocol during bolus administration of   intravenous contrast. Multiplanar reconstructed images and MIPs were   obtained and reviewed to evaluate the vascular anatomy.     RADIATION DOSE REDUCTION: This exam was performed according to the   departmental dose-optimization program which includes automated   exposure control, adjustment of the mA and/or kV according to   patient size and/or use of iterative reconstruction technique.     CONTRAST:  OMNIPAQUE IOHEXOL 350 MG/ML SOLN     COMPARISON:  07/09/2005     FINDINGS:   VASCULAR     Aorta: Minimal atherosclerotic changes of the distal thoracic aorta.   Diameter at the hiatus measures 18 mm.     No ulcerated plaque or pedunculated plaque. No wall thickening or   periaortic fluid/inflammation. No aneurysm.     Celiac: Celiac arteries patent without significant atherosclerotic   changes at the origin.     Surgical changes adjacent to the hepatic artery compatible with   prior transplant.     Aneurysm of the distal splenic artery is essentially unchanged in   size dating to the CT of 07/09/2005. Greatest diameter on the remote   CT 12 mm. Greatest diameter in similar axial plane on the current   CT, 12 mm. Similar appearance of near circumferential rim   calcification.     Additionally, there is a smaller partially rim calcified aneurysm on  a more distal arterial branch measuring 7 mm. While this was not   described on the baseline CT of 07/09/2005, it does appear to have   been present and unchanged diameter of 7 mm. Additional small nearly   completely calcified aneurysm at the hilum of the spleen, unchanged   from 2007, 6 mm     SMA: Patent, with no significant atherosclerotic changes.     Renals:     - Right: Main right renal artery patent without significant   atherosclerosis. Small accessory right renal artery to the lower   pole cortex.     - Left: Left renal artery patent.     IMA: Inferior mesenteric artery is patent.     Right lower extremity:     Unremarkable course, caliber, and contour of the right iliac system.   No aneurysm, dissection, or occlusion. No significant   atherosclerotic changes. Hypogastric artery is patent. Common   femoral artery patent. Proximal SFA and profunda femoris patent.     Left lower extremity:     Unremarkable course, caliber, and contour of the left iliac system.   No aneurysm, dissection, or occlusion. No significant   atherosclerotic changes. Hypogastric artery is patent. Common   femoral artery patent. Proximal SFA and profunda femoris patent.     Veins: Unremarkable appearance of the venous system.     Review of the MIP images confirms the above findings.     NON-VASCULAR     Lower chest: No acute.     Hepatobiliary: Unremarkable appearance of the liver. Unremarkable   gall bladder.     Pancreas: Unremarkable.     Spleen: Since the baseline CT 07/09/2005, there has been interval   decreased size of the spleen, now measuring less than 10 cm on axial     Adrenals/Urinary Tract:     - Right adrenal gland: Unremarkable     - Left adrenal gland: Unremarkable.     - Right kidney: No hydronephrosis, nephrolithiasis, inflammation, or   ureteral dilation. Cyst on the lower pole collecting system, 21 mm.     - Left Kidney: No hydronephrosis, nephrolithiasis, inflammation, or   ureteral dilation. No focal lesion.     - Urinary Bladder: Urinary bladder relatively decompressed.     Stomach/Bowel:     - Stomach: Unremarkable.     - Small bowel: Unremarkable     - Appendix: Normal.     - Colon: Unremarkable.     Lymphatic: No adenopathy.     Mesenteric: No free fluid or air. No mesenteric adenopathy.     Reproductive: Unremarkable uterus/adnexa     Other: Surgical changes of the midline abdomen.     Musculoskeletal: No bony canal narrowing. Mild degenerative changes   of the visualized thoracolumbar spine.     IMPRESSION:   Redemonstration of splenic artery aneurysm in the hilum of the   spleen, 12 mm, which is essentially unchanged in size and   configuration dating to the CT of 07/09/2005. Additionally, there   are 2 smaller aneurysms of more distal splenic branch arteries,   again relatively unchanged in this time interval.     Surgical changes of liver transplant.     Aortic Atherosclerosis (ICD10-I70.0).     Ancillary findings as above.     Signed,     Ami RAMAN. Alona HAS, RPVI     [facility]     [facility]       Electronically Signed  By: Ami Bellman D.O.     On: 08/04/2021 15:27          ASSESSMENT: Ms. 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  She has had no episodes of rejection or biliary strictures. Her last biopsy was on 01/27/2016 which showed NASH stage II disease. She denies any history of hypertension or diabetes. In regards to her immunosuppression she is currently on Prograf  1.5 mg twice a day. Trough level between 3-5. In regards to her chronic hepatitis C she was noted be genotype 1B and completed 12 weeks of Harvoni and achieved SVR. She is currently being followed by psychiatry and a counselor locally for depression and memory issues.  Discussed better sleep hygiene, (turn off TV at midnight), have a bedtime snack. Try to get in more calories, discussed with patient and daughter.     PLAN:  1. This patient was reviewed with Dr Sue  2. Labs today, then quarterly  3. No Med changes at this time  4. Increase protein intake  5. RTC in 1 years        Levan JUDITHANN Baron, NP  [facility]  [place], [place]  [address]  Ph: [phone]  Fax: [phone]    I personally spent 35 minutes face-to-face and non-face-to-face in the care of this patient, which includes all pre, intra, and post visit time on the date of service.  All documented time was specific to the E/M visit and does not include any procedures that may have been performed.

## 2024-02-17 NOTE — Unmapped (Signed)
 Blood work:  Today and every 3 months thereafter.    Food:   Increase your protein intake  Eggs, chicken, fish, yogurt  Fair life shakes  Have a big snack at bedtime.     Sleep:  Turn the TV off at midnight  And have a bedtime snack.

## 2024-02-18 LAB — TACROLIMUS LEVEL: TACROLIMUS BLOOD: 4.9 ng/mL

## 2024-02-21 ENCOUNTER — Ambulatory Visit (HOSPITAL_COMMUNITY): Admitting: Psychiatry

## 2024-02-21 DIAGNOSIS — E612 Magnesium deficiency: Principal | ICD-10-CM

## 2024-02-21 DIAGNOSIS — Z5181 Encounter for therapeutic drug level monitoring: Principal | ICD-10-CM

## 2024-02-21 DIAGNOSIS — Z944 Liver transplant status: Principal | ICD-10-CM

## 2024-02-22 ENCOUNTER — Other Ambulatory Visit: Payer: Self-pay | Admitting: *Deleted

## 2024-02-22 ENCOUNTER — Other Ambulatory Visit: Payer: Self-pay

## 2024-02-22 NOTE — Patient Outreach (Signed)
 Complex Care Management   Visit Note  02/22/2024  Name:  Candace Hill MRN: [id number] DOB: 08-Mar-1946  Situation: Referral received for Complex Care Management related to Dementia and HTN and Depression I obtained verbal consent from Caregiver.  Visit completed with Caregiver  on the phone  Background:   Past Medical History:  Diagnosis Date   Abdominal pain    Acute cystitis without hematuria 03/12/2022   Allergic rhinitis 11/14/2012   Amblyopia of eye, left 06/17/2017   Asbestos exposure 04/08/2018   Asthma    8005-8005 due to black mold house, no asthma now   Atherosclerosis of aorta 01/28/2023   Broken ankle    right   Broken rib    x2   Cataract    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 03/26/2011   Chronic hepatitis C virus infection 05/31/2013   Complication of anesthesia    IV phenergan   serious mental reaction   Delusional thoughts 03/18/2022   Diarrhea 03/13/2022   Dysuria 07/29/2023   Essential hypertension 10/14/2017   Gastroesophageal reflux disease 10/14/2017   Generalized anxiety disorder    Hernia, femoral    left   History of blood transfusion 1971   acquired hepatitis c   History of kidney stones    History of liver transplant 11/14/2012   History of nephrolithiasis 10/14/2017   History of shingles    Hyperglycemia 04/08/2018   Hyperlipidemia 01/15/2021   The 10-year ASCVD risk score (Arnett DK, et al., 2019) is: 16.2%    Values used to calculate the score:      Age: 24 years      Sex: Female      Is Non-Hispanic African American: No      Diabetic: No      Tobacco smoker: No      Systolic Blood Pressure: 136 mmHg      Is BP treated: No      HDL Cholesterol: 51.2 mg/dL      Total Cholesterol: 202 mg/dL     Intrinsic eczema 91/96/7977   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   Major depressive disorder 11/14/2012   Malignant neoplasm of liver 11/14/2012   Migraine 07/02/2015   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    vascular/medical; concerns for concurrent Alzheimer's disease   Overweight (BMI 25.0-29.9) 06/28/2015   PCO (posterior capsular opacification), right 03/26/2011   Seasonal affective disorder    Splenic artery aneurysm    Stage 4 chronic kidney disease 07/29/2023   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-will get Avastin  injections off/on   Vitamin D  deficiency 04/08/2018   Weight loss, non-intentional 03/12/2022    Assessment: Patient Reported Symptoms:  Cognitive Cognitive Status: Struggling with memory recall, Requires Assistance Decision Making, Able to follow simple commands, Difficulties with attention and concentration Behavior Disorders: Dementia   Health Maintenance Behaviors: Annual physical exam, Sleep adequate, Stress management Healing Pattern: Slow Health Facilitated by: Rest, Healthy diet  Neurological Neurological Review of Symptoms: Weakness, Dizziness (Patient's daughter reports that she has orthostatic hypotension) Neurological Management Strategies: Coping strategies, Medication therapy, Routine screening Neurological Self-Management Outcome: 2 (bad)  HEENT HEENT Symptoms Reported: No symptoms reported HEENT Management Strategies: Routine screening HEENT Self-Management Outcome: 4 (good)    Cardiovascular Cardiovascular Symptoms Reported: Dizziness Does patient have uncontrolled Hypertension?: Yes Is patient checking Blood Pressure at home?:  Yes (April reports that patient checks her BP every other day but does not like to check her blood pressure.) Patient's Recent BP reading at home: Patient does not like to check her BP daily.  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 Cardiovascular Management Strategies: Adequate rest, Coping strategies, Medication therapy, Routine screening, Fluid modification Cardiovascular Self-Management Outcome: 3 (uncertain)   Respiratory Respiratory Symptoms Reported: No symptoms reported Respiratory Management Strategies: Adequate rest, Routine screening Respiratory Self-Management Outcome: 4 (good)  Endocrine Endocrine Symptoms Reported: No symptoms reported, Weakness or fatigue Is patient diabetic?: No Is patient checking blood sugars at home?: Yes List most recent blood sugar readings, include date and time of day: April reports that patient has weakness and fatigue due to medications. Endocrine Self-Management Outcome: 4 (good)  Gastrointestinal Gastrointestinal Symptoms Reported: No symptoms reported Gastrointestinal Management Strategies: Adequate rest Gastrointestinal Self-Management Outcome: 4 (good)    Genitourinary   Genitourinary Management Strategies: Adequate rest Genitourinary Self-Management Outcome: 4 (good)  Integumentary Integumentary Symptoms Reported: No symptoms reported Additional Integumentary Details: April reports dry skin. Patient uses moisturizer. Skin Management Strategies: Adequate rest, Routine screening Skin Self-Management Outcome: 4 (good)  Musculoskeletal Musculoskelatal Symptoms Reviewed: Limited mobility Additional Musculoskeletal Details: Patient refuses to use a walker. Musculoskeletal Management Strategies: Adequate rest, Routine screening Musculoskeletal Self-Management Outcome: 3 (uncertain) Falls in the past year?: Yes Number of falls in past year: 2 or more Was there an injury with Fall?: No Fall Risk Category Calculator: 2 Patient Fall Risk Level: Moderate Fall Risk Patient at Risk for Falls Due to: History of fall(s) Fall risk Follow up: Falls evaluation completed, Education provided, Falls prevention discussed  Psychosocial Psychosocial Symptoms Reported: Depression - if selected complete PHQ 2-9 (April reports that patient has depression and is receives services at [facility] Behavioral Management Strategies: Medication therapy, Counseling Behavioral Health  Self-Management Outcome: 3 (uncertain) Major Change/Loss/Stressor/Fears (CP): Medical condition, self Techniques to Cope with Loss/Stress/Change: Counseling, Medication Quality of Family Relationships: helpful, involved, stressful Do you feel physically threatened by others?: No    02/22/2024    PHQ2-9 Depression Screening   Little interest or pleasure in doing things    Feeling down, depressed, or hopeless    PHQ-2 - Total Score    Trouble falling or staying asleep, or sleeping too much    Feeling tired or having little energy    Poor appetite or overeating     Feeling bad about yourself - or that you are a failure or have let yourself or your family down    Trouble concentrating on things, such as reading the newspaper or watching television    Moving or speaking so slowly that other people could have noticed.  Or the opposite - being so fidgety or restless that you have been moving around a lot more than usual    Thoughts that you would be better off dead, or hurting yourself in some way    PHQ2-9 Total Score    If you checked off any problems, how difficult have these problems made it for you to do your work, take care of things at home, or get along with other people    Depression Interventions/Treatment Medication, Currently on Treatment    Vitals:   02/22/24 1202  BP: 124/70    Medications Reviewed Today     Reviewed by Jorja Nichole LABOR, RN (Case Manager) on 02/22/24 at 1116  Med List Status: <None>   Medication Order Taking? Sig Documenting Provider Last Dose Status Informant  aspirin  EC  81 MG tablet [id number] Yes Take 81 mg by mouth daily. [provider]  Active Self  atorvastatin  (LIPITOR) 10 MG tablet [id number] Yes TAKE 1 TABLET EVERY DAY Joshua Debby CROME, MD  Active   Cholecalciferol (VITAMIN D3) 25 MCG (1000 UT) CAPS [id number] Yes Take 1 capsule (1,000 Units total) by mouth daily. Joshua Debby CROME, MD  Active   Ferrous Sulfate  (IRON) 325 (65 Fe) MG TABS  [id number] Yes Take 1 tablet by mouth daily. [provider]  Active Self  folic acid  (FOLVITE ) 400 MCG tablet [id number] Yes Take 800 mcg by mouth daily. [provider]  Active Self  lamoTRIgine  (LAMICTAL ) 25 MG tablet [id number] Yes Take 1 tablet (25 mg total) by mouth daily. Izella Ismael NOVAK, MD  Active   memantine  (NAMENDA  XR) 21 MG CP24 24 hr capsule [id number] Yes TAKE 1 CAPSULE(21 MG) BY MOUTH DAILY Joshua Debby CROME, MD  Active   sertraline  (ZOLOFT ) 100 MG tablet [id number] Yes Take 1 tablet (100 mg total) by mouth daily. Take with 50mg  tablet daily Hoang, Daniela B, MD  Active   sertraline  (ZOLOFT ) 25 MG tablet [id number] Yes Take 1 tablet (25 mg total) by mouth daily. Izella Ismael NOVAK, MD  Active   sertraline  (ZOLOFT ) 50 MG tablet [id number] Yes Take 1 tablet (50 mg total) by mouth daily. Izella Ismael NOVAK, MD  Active   tacrolimus  (PROGRAF ) 0.5 MG capsule [id number] Yes Take 0.5 mg by mouth 2 (two) times daily. 6 by mouth daily NAME BRAND ONLY [provider]  Active Self            Recommendation:   PCP Follow-up Specialty provider follow-up [facility]-03/13/24; Neurology-05/03/24;  Continue Current Plan of Care  Follow Up Plan:   Telephone follow-up 2 weeks: 03/09/24 @11  am  Sola Margolis, RN, BSN, ACM RN Care Manager [employer] [phone]

## 2024-02-22 NOTE — Patient Instructions (Signed)
 Visit Information  Thank you for taking time to visit with me today. Please don't hesitate to contact me if I can be of assistance to you before our next scheduled appointment.  Our next appointment is by telephone on 03/09/24 at 11 am Please call the care guide team at [phone] if you need to cancel or reschedule your appointment.   Following is a copy of your care plan:   Goals Addressed             This Visit's Progress    VBCI RN Care Plan- HTN       Problems:  Care Coordination needs related to Depression   and Dementia Chronic Disease Management support and education needs related to Dementia, Depression, and HTN  Goal: Over the next 90 days the Caregiver will attend all scheduled medical appointments: with PCP and Specialist  as evidenced by keeping all schedule appointments.        continue to work with Medical illustrator and/or Social Worker to address care management and care coordination needs related to Dementia and HTN as evidenced by adherence to care management team scheduled appointments     take all medications exactly as prescribed and will call provider for medication related questions as evidenced by compliance.    verbalize basic understanding of Dementia, Depression, and HTN disease process and self health management plan as evidenced by verbal explanation, recognize and monitor symptoms/life style changes. work with Child psychotherapist to address Mental Health Concerns  and Dementia related to the management of Dementia, Depression, and HTN as evidenced by review of electronic medical record and patient or social worker report      Interventions:   Hypertension Interventions: Last practice recorded BP readings:  BP Readings from Last 3 Encounters:  02/22/24 124/70  01/26/24 123/73  01/11/24 110/68   Most recent eGFR/CrCl: No results found for: EGFR  No components found for: CRCL  Evaluation of current treatment plan related to hypertension self management  and patient's adherence to plan as established by provider Provided education to patient re: stroke prevention, s/s of heart attack and stroke Reviewed medications with patient and discussed importance of compliance Advised patient, providing education and rationale, to monitor blood pressure daily and record, calling PCP for findings outside established parameters Discussed complications of poorly controlled blood pressure such as heart disease, stroke, circulatory complications, vision complications, kidney impairment, sexual dysfunction Screening for signs and symptoms of depression related to chronic disease state  Assessed social determinant of health barriers Take Blood pressure once weekly  Dementia: Evaluation of current treatment plan related to misuse of: Alzheimer's dementia Encouraged patient/caregiver counseling/support: To attend all appointments with [facility] provider, Consideration of in-home help encouraged , Discussed Health Care Power of Attorney , Discussed importance of attendance to all provider appointments, and Advised to contact provider for new or worsening symptoms  Patient Self-Care Activities:  Attend all scheduled provider appointments Call pharmacy for medication refills 3-7 days in advance of running out of medications Call provider office for new concerns or questions  Take medications as prescribed   Work with the social worker to address care coordination needs and will continue to work with the clinical team to address health care and disease management related needs  Plan:  Telephone follow up appointment with care management team member scheduled for:  03/09/24 @ 11 am             Please call the Suicide and Crisis Lifeline: 988 call the [place]  [employer]  Suicide Prevention Lifeline: (682)346-7188 or TTY: 859-306-7405 TTY (808)271-8344) to talk to a trained counselor call 1-800-273-TALK (toll free, 24 hour hotline) go to [facility]  Urgent Care [address], [place] [phone]) call 911 if you are experiencing a Mental Health or Behavioral Health Crisis or need someone to talk to.  Patient verbalizes understanding of instructions and care plan provided today and agrees to view in MyChart. Active MyChart status and patient understanding of how to access instructions and care plan via MyChart confirmed with patient.     Jeffrey Voth, RN, BSN, Theatre manager [employer] [phone]

## 2024-02-23 DIAGNOSIS — N1832 Chronic kidney disease, stage 3b: Secondary | ICD-10-CM | POA: Diagnosis not present

## 2024-02-23 DIAGNOSIS — D84821 Immunodeficiency due to drugs: Secondary | ICD-10-CM | POA: Diagnosis not present

## 2024-02-23 DIAGNOSIS — R634 Abnormal weight loss: Secondary | ICD-10-CM | POA: Diagnosis not present

## 2024-02-23 DIAGNOSIS — C229 Malignant neoplasm of liver, not specified as primary or secondary: Secondary | ICD-10-CM | POA: Diagnosis not present

## 2024-02-23 DIAGNOSIS — E559 Vitamin D deficiency, unspecified: Secondary | ICD-10-CM | POA: Diagnosis not present

## 2024-02-23 DIAGNOSIS — Z944 Liver transplant status: Secondary | ICD-10-CM | POA: Diagnosis not present

## 2024-02-23 DIAGNOSIS — N200 Calculus of kidney: Secondary | ICD-10-CM | POA: Diagnosis not present

## 2024-02-24 NOTE — Unmapped (Signed)
 [facility] Refill Coordination Note    Specialty Medication(s) to be Shipped:   Transplant: tacrolimus  0.5 mg    Other medication(s) to be shipped: No additional medications requested for fill at this time    Specialty Medications not needed at this time: N/A     Kristy Hanson, DOB: 12-13-1945  Phone: [phone] (home)       All above HIPAA information was verified with patient.     Was a Nurse, learning disability used for this call? No    Completed refill call assessment today to schedule patient's medication shipment from the [facility]  [phone]).  All relevant notes have been reviewed.     Specialty medication(s) and dose(s) confirmed: Regimen is correct and unchanged.   Changes to medications: Kristy Hanson reports no changes at this time.  Changes to insurance: No  New side effects reported not previously addressed with a pharmacist or physician: None reported  Questions for the pharmacist: No    Confirmed patient received a Conservation officer, historic buildings and a Surveyor, mining with first shipment. The patient will receive a drug information handout for each medication shipped and additional FDA Medication Guides as required.       DISEASE/MEDICATION-SPECIFIC INFORMATION        N/A    SPECIALTY MEDICATION ADHERENCE     Medication Adherence    Patient reported X missed doses in the last month: 0  Specialty Medication: tacrolimus  0.5 MG capsule (PROGRAF )  Patient is on additional specialty medications: No              Were doses missed due to medication being on hold? No    tacrolimus  0.5 MG capsule (PROGRAF )  9 days of medicine on hand       REFERRAL TO PHARMACIST     Referral to the pharmacist: Not needed      [facility]     Shipping address confirmed in Epic.     Cost and Payment: Patient has a copay of $47.00. They are aware and have authorized the pharmacy to charge the credit card on file.    Delivery Scheduled: Yes, Expected medication delivery date: 02/29/2024. Medication will be delivered via UPS to the prescription address in Epic WAM.    [facility]  Specialty Technician

## 2024-02-28 DIAGNOSIS — Z5181 Encounter for therapeutic drug level monitoring: Principal | ICD-10-CM

## 2024-02-28 DIAGNOSIS — Z944 Liver transplant status: Principal | ICD-10-CM

## 2024-02-28 DIAGNOSIS — E612 Magnesium deficiency: Principal | ICD-10-CM

## 2024-02-28 MED FILL — TACROLIMUS 0.5 MG CAPSULE, IMMEDIATE-RELEASE: ORAL | 30 days supply | Qty: 180 | Fill #4

## 2024-03-06 ENCOUNTER — Encounter: Payer: Self-pay | Admitting: Orthopedic Surgery

## 2024-03-06 DIAGNOSIS — Z944 Liver transplant status: Principal | ICD-10-CM

## 2024-03-06 DIAGNOSIS — E612 Magnesium deficiency: Principal | ICD-10-CM

## 2024-03-06 DIAGNOSIS — Z5181 Encounter for therapeutic drug level monitoring: Principal | ICD-10-CM

## 2024-03-06 NOTE — Progress Notes (Signed)
 This encounter was created in error - please disregard.

## 2024-03-08 ENCOUNTER — Other Ambulatory Visit: Payer: Self-pay | Admitting: Licensed Clinical Social Worker

## 2024-03-08 ENCOUNTER — Other Ambulatory Visit (HOSPITAL_COMMUNITY): Payer: Self-pay | Admitting: Psychiatry

## 2024-03-08 ENCOUNTER — Telehealth (HOSPITAL_COMMUNITY): Payer: Self-pay

## 2024-03-08 DIAGNOSIS — F411 Generalized anxiety disorder: Secondary | ICD-10-CM

## 2024-03-08 DIAGNOSIS — F331 Major depressive disorder, recurrent, moderate: Secondary | ICD-10-CM

## 2024-03-08 MED ORDER — LAMOTRIGINE 25 MG PO TABS: 25.0000 mg | ORAL_TABLET | Freq: Every day | ORAL | 0 refills | Status: DC

## 2024-03-08 NOTE — Patient Outreach (Signed)
 Complex Care Management   Visit Note  03/08/2024  Name:  Candace Hill MRN: [id number] DOB: June 06, 1946  Situation: Referral received for Complex Care Management related to Level of Care Concerns. I obtained verbal consent from Caregiver.  Visit completed with Caregiver  on the phone  Background:   Past Medical History:  Diagnosis Date   Abdominal pain    Acute cystitis without hematuria 03/12/2022   Allergic rhinitis 11/14/2012   Amblyopia of eye, left 06/17/2017   Asbestos exposure 04/08/2018   Asthma    8005-8005 due to black mold house, no asthma now   Atherosclerosis of aorta 01/28/2023   Broken ankle    right   Broken rib    x2   Cataract    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 03/26/2011   Chronic hepatitis C virus infection 05/31/2013   Complication of anesthesia    IV phenergan   serious mental reaction   Delusional thoughts 03/18/2022   Diarrhea 03/13/2022   Dysuria 07/29/2023   Essential hypertension 10/14/2017   Gastroesophageal reflux disease 10/14/2017   Generalized anxiety disorder    Hernia, femoral    left   History of blood transfusion 1971   acquired hepatitis c   History of kidney stones    History of liver transplant 11/14/2012   History of nephrolithiasis 10/14/2017   History of shingles    Hyperglycemia 04/08/2018   Hyperlipidemia 01/15/2021   The 10-year ASCVD risk score (Arnett DK, et al., 2019) is: 16.2%    Values used to calculate the score:      Age: 78 years      Sex: Female      Is Non-Hispanic African American: No      Diabetic: No      Tobacco smoker: No      Systolic Blood Pressure: 136 mmHg      Is BP treated: No      HDL Cholesterol: 51.2 mg/dL      Total Cholesterol: 202 mg/dL     Intrinsic eczema 91/96/7977   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   Major depressive disorder 11/14/2012   Malignant neoplasm of liver 11/14/2012   Migraine 07/02/2015   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    vascular/medical; concerns for concurrent Alzheimer's disease   Overweight (BMI 25.0-29.9) 06/28/2015   PCO (posterior capsular opacification), right 03/26/2011   Seasonal affective disorder    Splenic artery aneurysm    Stage 4 chronic kidney disease 07/29/2023   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-will get Avastin  injections off/on   Vitamin D  deficiency 04/08/2018   Weight loss, non-intentional 03/12/2022    Assessment: Patient Reported Symptoms:  Cognitive Cognitive Status: Struggling with memory recall, Requires Assistance Decision Making, Able to follow simple commands Cognitive/Intellectual Conditions Management [RPT]: Behavior Disorders   Health Maintenance Behaviors: Annual physical exam, Sleep adequate, Stress management Healing Pattern: Slow Health Facilitated by: Healthy diet, Rest  Neurological Neurological Review of Symptoms: Dizziness, Weakness Neurological Management Strategies: Coping strategies, Medication therapy, Routine screening Neurological Self-Management Outcome: 3 (uncertain)  Psychosocial Psychosocial Symptoms Reported: Sadness - if selected complete PHQ 2-9 Behavioral Management Strategies: Medication therapy, Counseling Behavioral Health Self-Management Outcome: 4 (good) Major Change/Loss/Stressor/Fears (CP): Medical condition, self Techniques to Cope with Loss/Stress/Change: Medication, Counseling Quality of Family Relationships: involved, helpful Do you feel physically threatened by others?:  No    03/08/2024    PHQ2-9 Depression Screening   Little interest or pleasure in doing things    Feeling down, depressed, or hopeless    PHQ-2 - Total Score    Trouble falling or staying asleep, or sleeping too much    Feeling tired or having little energy    Poor appetite or overeating     Feeling bad about yourself - or that you are a failure or have let yourself  or your family down    Trouble concentrating on things, such as reading the newspaper or watching television    Moving or speaking so slowly that other people could have noticed.  Or the opposite - being so fidgety or restless that you have been moving around a lot more than usual    Thoughts that you would be better off dead, or hurting yourself in some way    PHQ2-9 Total Score    If you checked off any problems, how difficult have these problems made it for you to do your work, take care of things at home, or get along with other people    Depression Interventions/Treatment      There were no vitals filed for this visit.  Medications Reviewed Today     Reviewed by Merlynn Lyle CROME, LCSW (Social Worker) on 03/08/24 at 1019  Med List Status: <None>   Medication Order Taking? Sig Documenting Provider Last Dose Status Informant  aspirin  EC 81 MG tablet [id number]  Take 81 mg by mouth daily. [provider]  Active Self  atorvastatin  (LIPITOR) 10 MG tablet [id number]  TAKE 1 TABLET EVERY DAY Joshua Debby CROME, MD  Active   Cholecalciferol (VITAMIN D3) 25 MCG (1000 UT) CAPS [id number]  Take 1 capsule (1,000 Units total) by mouth daily. Joshua Debby CROME, MD  Active   Ferrous Sulfate  (IRON) 325 (65 Fe) MG TABS [id number]  Take 1 tablet by mouth daily. [provider]  Active Self  folic acid  (FOLVITE ) 400 MCG tablet [id number]  Take 800 mcg by mouth daily. [provider]  Active Self  lamoTRIgine  (LAMICTAL ) 25 MG tablet [id number]  Take 1 tablet (25 mg total) by mouth daily. Izella Ismael NOVAK, MD  Active   memantine  (NAMENDA  XR) 21 MG CP24 24 hr capsule [id number]  TAKE 1 CAPSULE(21 MG) BY MOUTH DAILY Joshua Debby CROME, MD  Active   sertraline  (ZOLOFT ) 100 MG tablet [id number]  Take 1 tablet (100 mg total) by mouth daily. Take with 50mg  tablet daily Hoang, Daniela B, MD  Active   sertraline  (ZOLOFT ) 25 MG tablet [id number]  Take 1 tablet (25 mg total) by mouth daily. Izella Ismael NOVAK,  MD  Active   sertraline  (ZOLOFT ) 50 MG tablet [id number]  Take 1 tablet (50 mg total) by mouth daily. Izella Ismael NOVAK, MD  Active   tacrolimus  (PROGRAF ) 0.5 MG capsule [id number]  Take 0.5 mg by mouth 2 (two) times daily. 6 by mouth daily NAME BRAND ONLY [provider]  Active Self            Recommendation:   PCP Follow-up Continue Current Plan of Care  Follow Up Plan:   Closing From:  VBCI CCM LCSW, patient refuses LTC placement at this time.  Lyle Merlynn, BSW, MSW, LCSW Licensed Clinical Social Worker [employer]   [email or web address] Direct Dial: [phone]

## 2024-03-08 NOTE — Telephone Encounter (Signed)
 received fax requesting a refill on lamotrigine  25mg . pt was last seen on 7-28 next appt 9-24

## 2024-03-08 NOTE — Patient Instructions (Signed)
 Visit Information  Thank you for taking time to visit with me today. Please don't hesitate to contact me if I can be of assistance to you in the future!  Following is a copy of your care plan:   Goals Addressed             This Visit's Progress    LCSW VBCI Social Work Care Plan       Problems:   Care Coordination needs related to Dementia: Dementia with anxiety (Level of care concerns regarding safety in the home)  CSW Clinical Goal(s):   Over the next 90 days the patient/family will decrease symptoms of caregiver strain by enhancing safety in the home, promoting social interaction, managing chronic diseases and utilizing community resources.   Interventions: Current level of care: Home with other family or significant other(s): Pt lives alone and DOES not want placement.  Evaluation of patient safety in current living environment and review of Dementia resources and support  Assessed needs, level of care concerns, how currently meeting needs and barriers to care Community Alternative Program (CAP) Pt unable to qualify for Medicaid as her SS is 3,000 per month.  Discuss community support options (Adult Day Programs, PACE is a great option for family to consider) Discussed private pay options for personal care needs (Private Pay Caregivers) DSS in-home aide program:(Wait list) Long-Term Care Insurance (Family do not want to consider any out of pocket expenses at this time in order to increase home support) In last PCP visit it was noted that patient her Psychiatric/Behavioral symptoms: Positive for confusion, decreased concentration and dysphoric mood. The patient is nervous/anxious. [facility] psychiatry involved. Patient is being seen regularly and has an upcoming appointment on 03/13/24 which was confirmed by daughter April.   Social Determinants of Health in Patient with  SDOH assessments completed: Alcohol/Substance Use, Depression  , Financial Strain , Geophysicist/field seismologist , Housing ,  Intimate Partner Violence, Physical Activity, Social Connections, Stress, Tobacco Use, and Transportation Evaluation of current treatment plan related to unmet needs- Family was advised to consider gaining additional support in the home to increase patient safety (which will be out of pocket for services to start asap.   Patient Goals/Self-Care Activities: Contact PCP office regarding Level of Care concerns, Increase self-care, Consider Adult Day Programs(PACE), Attend all Medical Appointments   Plan: VBCI LCSW will sign off at this time as there are no clinical SW needs (family refuse placement at this time)        Please call the Suicide and Crisis Lifeline: 988 call the [place]  National Suicide Prevention Lifeline: 347-089-6622 or TTY: 514-460-3126 TTY 684-731-1629) to talk to a trained counselor call 1-800-273-TALK (toll free, 24 hour hotline) go to [facility] [address] [place] [phone]) call 911 if you are experiencing a Mental Health or Behavioral Health Crisis or need someone to talk to.  Patient verbalizes understanding of instructions and care plan provided today and agrees to view in MyChart. Active MyChart status and patient understanding of how to access instructions and care plan via MyChart confirmed with patient.     Lyle Rung, BSW, MSW, LCSW Licensed Clinical Social Worker [employer]   [facility] [email or web address] Direct Dial: [phone]

## 2024-03-09 ENCOUNTER — Other Ambulatory Visit: Payer: Self-pay | Admitting: *Deleted

## 2024-03-09 NOTE — Patient Instructions (Signed)
 [name] - I am sorry I was unable outreach with you today for our scheduled appointment. have rescheduled our appointment for 04/03/24 @ 11 am.  I work with Joshua Debby CROME, MD and am calling to support your healthcare needs. Please contact me at [phone] at your earliest convenience. I look forward to speaking with you soon.   Thank you,  Tashunda Vandezande, RN, BSN, ACM RN Care Manager [employer] [phone]

## 2024-03-10 ENCOUNTER — Telehealth: Admitting: Licensed Clinical Social Worker

## 2024-03-13 ENCOUNTER — Ambulatory Visit (HOSPITAL_COMMUNITY): Admitting: Psychiatry

## 2024-03-13 DIAGNOSIS — F411 Generalized anxiety disorder: Secondary | ICD-10-CM

## 2024-03-13 DIAGNOSIS — E612 Magnesium deficiency: Principal | ICD-10-CM

## 2024-03-13 DIAGNOSIS — Z5181 Encounter for therapeutic drug level monitoring: Principal | ICD-10-CM

## 2024-03-13 DIAGNOSIS — Z944 Liver transplant status: Principal | ICD-10-CM

## 2024-03-13 NOTE — Progress Notes (Signed)
 [facility] [address] [place] [address] [place] [address] Dept: [phone] Dept Fax: [phone]  Psychotherapy Progress Note  Patient ID: Candace Hill, female  DOB: 08/13/1945, 78 y.o.  MRN: [id number]  03/13/2024 Start time: 3:30 PM End time: 4:30 PM  Method of Visit: Face-to-Face  Present: patient  Current Concerns: family tension  Current Symptoms: Anxiety Psychiatric Specialty Exam: General Appearance: appears at stated age, casually dressed and groomed   Behavior: pleasant and cooperative   Psychomotor Activity: no psychomotor agitation or retardation noted   Eye Contact: fair  Speech: normal amount, volume and fluency    Mood: anxious Affect: congruent, pleasant  Thought Process: linear, goal directed, no circumstantial or tangential thought process noted, no racing thoughts or flight of ideas  Descriptions of Associations: intact   Thought Content Hallucinations: denies AH, VH , does not appear responding to stimuli  Delusions: no paranoia, delusions of control, grandeur, ideas of reference, thought broadcasting, and magical thinking  Suicidal Thoughts: denies SI, intention, plan  Homicidal Thoughts: denies HI, intention, plan   Alertness/Orientation: alert and fully oriented   Insight: fair Judgment: fair  Memory: intact   Executive Functions  Concentration: intact  Attention Span: fair  Recall: intact  Fund of Knowledge: fair   Physical Exam  General: Pleasant, well-appearing. No acute distress. Pulmonary: Normal effort. No wheezing or rales. Skin: No obvious rash or lesions. Neuro: A&Ox3.No focal deficit.  Review of Systems  No reported symptoms   Diagnosis: MDD, GAD  Anticipated Frequency of Visits: every other week Anticipated Length of Treatment Episode: 6 months  Short Term Goals/Goals for Treatment Session:  -Behavioral activation and coping with family  tension Patient discusses her struggles regarding her daughter. She states that her daughter has met a new romantic interest and has not been coming over to her house as often. She states that this worries her because her daughter is the one who manages her medications and her daughter has emphasized that pt can call her for any issues but last week when the pt had an issue about one of her medications, her daughter sis not answer until a few days later. Patient also voices ongoing tension about her daughter criticizing her home temperature and patient communicated to her daughter and since it is her her home that she has her own rules. Her daughter became upset and did not talk to the patient about almost a week after this. Patient also feels sad about trying to reach out to her friends but only one of them reached back out to her. Discussed reframing her thoughts in that likely her friends are not trying to purposely ignore her but rather that they are busy. Stated her goal to discuss for next visit is to revisit a hobby that she used to enjoy.  Progress Towards Goals: Progressing as evidenced to communicating with her daughter about her feelings  Treatment Intervention: Behavior therapy  Medical Necessity: Improved patient condition  Assessment Tools:    02/09/2024   11:38 AM 01/26/2024    9:02 AM 12/30/2023    1:26 PM  Depression screen PHQ 2/9  Decreased Interest 2 2 0  Down, Depressed, Hopeless 2 2 1   PHQ - 2 Score 4 4 1   Altered sleeping 3 0 0  Tired, decreased energy 3 2 1   Change in appetite 2 2 0  Feeling bad or failure about yourself  1 1 0  Trouble concentrating 3 3 3   Moving slowly  or fidgety/restless 2 0   Suicidal thoughts 0 0 0  PHQ-9 Score 18 12 5   Difficult doing work/chores  Somewhat difficult Somewhat difficult   Failed to redirect to the Timeline version of the REVFS SmartLink. Flowsheet Row ED from 09/10/2023 in [facility] Emergency Department at [facility]  ED from 08/26/2023 in [facility] Emergency Department at [facility] Counselor from 12/30/2022 in [facility] at [facility] RISK CATEGORY No Risk No Risk Low Risk     Patient/Guardian was advised Release of Information must be obtained prior to any record release in order to collaborate their care with an outside provider. Patient/Guardian was advised if they have not already done so to contact the registration department to sign all necessary forms in order for us  to release information regarding their care.   Consent: Patient/Guardian gives verbal consent for treatment and assignment of benefits for services provided during this visit. Patient/Guardian expressed understanding and agreed to proceed.   Plan: f/u in about 2 weeks  Ismael KATHEE Franco, MD 03/13/2024

## 2024-03-15 ENCOUNTER — Ambulatory Visit (HOSPITAL_COMMUNITY): Admitting: Psychiatry

## 2024-03-20 ENCOUNTER — Encounter (HOSPITAL_COMMUNITY): Payer: Self-pay | Admitting: Psychiatry

## 2024-03-20 DIAGNOSIS — Z944 Liver transplant status: Principal | ICD-10-CM

## 2024-03-20 DIAGNOSIS — Z5181 Encounter for therapeutic drug level monitoring: Principal | ICD-10-CM

## 2024-03-20 DIAGNOSIS — E612 Magnesium deficiency: Principal | ICD-10-CM

## 2024-03-21 ENCOUNTER — Telehealth (HOSPITAL_COMMUNITY): Payer: Self-pay | Admitting: Psychiatry

## 2024-03-21 ENCOUNTER — Telehealth (HOSPITAL_COMMUNITY): Payer: Self-pay

## 2024-03-21 NOTE — Telephone Encounter (Signed)
 Patient's daughter called provider back from missed call yesterday.

## 2024-03-21 NOTE — Telephone Encounter (Signed)
 Patient called the clinic and I called the phone number in the chart back. Patient's daughter answered and states that patient has been getting upset at her because of having a social life (see message sent from patient's daughter). She states that she is at loss a loss regarding the patient and she feels that the patient's mentation is worsening. I discussed that I will schedule a medication management appointment with the patient. Patient's daughter provided me patient's home phone number.  I called patient [phone]) and she states that she is in a crisis. She states that her daughter dropped her from her life. She called her friend from [place]  about a careiver that can help the patient and she states that the caregiver is planning on contacting her today to make arrangements. I recommended scheduling a medication management appointment and she is in agreement. Of note, patient's daughter manages patient's medications so it will be an ideal time for both parties to be present in the future appointment to discuss medications but also communicate about the recent tensions.  Ismael Franco, MD PGY-3 Psychiatry Resident

## 2024-03-21 NOTE — Progress Notes (Signed)
 Candace Hill has been contacted in regards to their refill of tacrolimus  0.5 MG capsule (PROGRAF ). At this time, they have declined refill due to patient having 20 doses remaining. Refill assessment call date has been updated per the patient's request.

## 2024-03-21 NOTE — Unmapped (Signed)
 Kristy Hanson has been contacted in regards to their refill of tacrolimus  0.5 MG capsule (PROGRAF ). At this time, they have declined refill due to patient having 20 doses remaining. Refill assessment call date has been updated per the patient's request.

## 2024-03-22 ENCOUNTER — Other Ambulatory Visit: Payer: Self-pay

## 2024-03-22 ENCOUNTER — Ambulatory Visit (HOSPITAL_COMMUNITY): Admitting: Psychiatry

## 2024-03-22 NOTE — Patient Outreach (Signed)
 Complex Care Management   Visit Note  03/22/2024  Name:  Candace Hill MRN: [id number] DOB: July 08, 1945  Situation: Referral received for Complex Care Management related to care giver strain I obtained verbal consent from patient's daughter.  Visit completed with patient's daughter  on the phone  Background:   Past Medical History:  Diagnosis Date   Abdominal pain    Acute cystitis without hematuria 03/12/2022   Allergic rhinitis 11/14/2012   Amblyopia of eye, left 06/17/2017   Asbestos exposure 04/08/2018   Asthma    8005-8005 due to black mold house, no asthma now   Atherosclerosis of aorta 01/28/2023   Broken ankle    right   Broken rib    x2   Cataract    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 03/26/2011   Chronic hepatitis C virus infection 05/31/2013   Complication of anesthesia    IV phenergan   serious mental reaction   Delusional thoughts 03/18/2022   Diarrhea 03/13/2022   Dysuria 07/29/2023   Essential hypertension 10/14/2017   Gastroesophageal reflux disease 10/14/2017   Generalized anxiety disorder    Hernia, femoral    left   History of blood transfusion 1971   acquired hepatitis c   History of kidney stones    History of liver transplant 11/14/2012   History of nephrolithiasis 10/14/2017   History of shingles    Hyperglycemia 04/08/2018   Hyperlipidemia 01/15/2021   The 10-year ASCVD risk score (Arnett DK, et al., 2019) is: 16.2%    Values used to calculate the score:      Age: 5 years      Sex: Female      Is Non-Hispanic African American: No      Diabetic: No      Tobacco smoker: No      Systolic Blood Pressure: 136 mmHg      Is BP treated: No      HDL Cholesterol: 51.2 mg/dL      Total Cholesterol: 202 mg/dL     Intrinsic eczema 91/96/7977   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   Major depressive disorder 11/14/2012   Malignant neoplasm of liver 11/14/2012   Migraine 07/02/2015   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 Candace reports since she last spoke to BSW there has been a change with her mother. Candace reports her mother Candace Hill is upset with her and will not allow her to go check up on her and rejects her phone calls. Candace confirmed her mother is ok since her daughter recently spoke with pt yesterday. Candace has informed pt's psychiatrist of the situation. Candace reports pt does not want her to go over to do medication and mother is pushing her away. Candace confirmed she is doing fine and handling things ok but worries about the wellbeing of her mother since she forgets to take important medication. BSW attempted  to provide additional resources for support but was limited to lack of knowledge of resources. BSW informed Candace DSS APS might be able to support if there is a ground for a APS report to be made and opened. Daughter believes there are no grounds or risks of her being harmed or abused at this time. BSW reminded daughter of the different suggestions that have been made and daughter agrees they are food suggestions but states she cannot force her mother to go somewhere she does not want to or do something she does not want to. Daughter made BSW aware they are looking for a geriatric PCP and psychiatrist but cannot find one. BSW encourager daughter to call [facility] Medicare and request information about in-network providers that work with  geriatric patients. Daughter agreed to do so. No other resources were provided/requested at this time.   SDOH Interventions    Flowsheet Row Patient Outreach Telephone from 03/22/2024 in [place] POPULATION HEALTH DEPARTMENT Patient Outreach Telephone from 02/22/2024 in [place] POPULATION HEALTH DEPARTMENT Patient Outreach Telephone from 02/09/2024 in [place] POPULATION HEALTH DEPARTMENT Patient Outreach Telephone from 01/25/2024 in [place] POPULATION HEALTH DEPARTMENT Clinical Support from 12/30/2023 in [facility] Coordination from 04/30/2023 in [facility] Community Care Coordination  SDOH Interventions        Food Insecurity Interventions Intervention Not Indicated Intervention Not Indicated -- Intervention Not Indicated Intervention Not Indicated Intervention Not Indicated  Housing Interventions Intervention Not Indicated Intervention Not Indicated -- Intervention Not Indicated Intervention Not Indicated --  Transportation Interventions Intervention Not Indicated Intervention Not Indicated Intervention Not Indicated -- Intervention Not Indicated Intervention Not Indicated  Utilities Interventions Intervention Not Indicated Intervention Not Indicated -- -- Intervention Not Indicated Intervention Not Indicated  Alcohol Usage Interventions -- -- -- -- Intervention Not Indicated (Score <7) --  Depression Interventions/Treatment  -- Medication, Currently on Treatment Medication, Counseling, Currently on Treatment Medication, Currently on Treatment Medication --  Financial Strain Interventions Intervention Not Indicated -- -- -- Intervention Not Indicated --  Physical Activity Interventions -- -- -- -- Intervention Not Indicated --  Stress Interventions -- -- Programmer, applications Provided, Education officer, environmental Provided, Provide Counseling Intervention Not Indicated --  Social Connections Interventions -- -- -- --  Intervention Not Indicated --  Health Literacy Interventions -- -- -- -- Intervention Not Indicated --      Recommendation:   None at this time.   Follow Up Plan:   Telephone follow up appointment date/time:  03/30/2024 at 10AM  Laymon Doll, [place] /VBCI - [facility] Social Worker [phone]

## 2024-03-22 NOTE — Patient Instructions (Signed)
 Visit Information  Thank you for taking time to visit with me today. Please don't hesitate to contact me if I can be of assistance to you before our next scheduled appointment.  Your next care management appointment is by telephone on 03/30/2024 at 10AM   Telephone follow up appointment date/time:  03/30/2024 at 10AM.  Please call the care guide team at [phone] if you need to cancel, schedule, or reschedule an appointment.   Please call the Suicide and Crisis Lifeline: 988 go to [facility]gent [facility] [address] [place] [phone]) call 911 if you are experiencing a Mental Health or Behavioral Health Crisis or need someone to talk to.  Laymon Doll, BSW [place]/VBCI - [employer] Social Worker [phone]

## 2024-03-27 ENCOUNTER — Ambulatory Visit (HOSPITAL_COMMUNITY): Admitting: Psychiatry

## 2024-03-27 DIAGNOSIS — Z944 Liver transplant status: Principal | ICD-10-CM

## 2024-03-27 DIAGNOSIS — Z5181 Encounter for therapeutic drug level monitoring: Principal | ICD-10-CM

## 2024-03-27 DIAGNOSIS — E612 Magnesium deficiency: Principal | ICD-10-CM

## 2024-03-29 ENCOUNTER — Ambulatory Visit (HOSPITAL_COMMUNITY): Admitting: Psychiatry

## 2024-03-30 ENCOUNTER — Other Ambulatory Visit: Payer: Self-pay

## 2024-03-30 NOTE — Patient Instructions (Signed)
 Visit Information  Thank you for taking time to visit with me today. Please don't hesitate to contact me if I can be of assistance to you before our next scheduled appointment.  Your next care management appointment is no further scheduled appointments.    No further scheduled appointments.  Please call the care guide team at [phone] if you need to cancel, schedule, or reschedule an appointment.   Please call the Suicide and Crisis Lifeline: 988 go to [facility]gent [facility] [address] [place] [phone]) call 911 if you are experiencing a Mental Health or Behavioral Health Crisis or need someone to talk to.  Laymon Doll, BSW [place]/VBCI - [employer] Social Worker [phone]

## 2024-03-30 NOTE — Patient Outreach (Signed)
 Complex Care Management   Visit Note  03/30/2024  Name:  Candace Hill MRN: [id number] DOB: Dec 04, 1945  Situation: Referral received for Complex Care Management related to level of care concerns I obtained verbal consent from Patient.  Visit completed with Patient  on the phone  Background:   Past Medical History:  Diagnosis Date   Abdominal pain    Acute cystitis without hematuria 03/12/2022   Allergic rhinitis 11/14/2012   Amblyopia of eye, left 06/17/2017   Asbestos exposure 04/08/2018   Asthma    [id number] due to black mold house, no asthma now   Atherosclerosis of aorta 01/28/2023   Broken ankle    right   Broken rib    x2   Cataract    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 03/26/2011   Chronic hepatitis C virus infection 05/31/2013   Complication of anesthesia    IV phenergan   serious mental reaction   Delusional thoughts 03/18/2022   Diarrhea 03/13/2022   Dysuria 07/29/2023   Essential hypertension 10/14/2017   Gastroesophageal reflux disease 10/14/2017   Generalized anxiety disorder    Hernia, femoral    left   History of blood transfusion 1971   acquired hepatitis c   History of kidney stones    History of liver transplant 11/14/2012   History of nephrolithiasis 10/14/2017   History of shingles    Hyperglycemia 04/08/2018   Hyperlipidemia 01/15/2021   The 10-year ASCVD risk score (Arnett DK, et al., 2019) is: 16.2%    Values used to calculate the score:      Age: 78 years      Sex: Female      Is Non-Hispanic African American: No      Diabetic: No      Tobacco smoker: No      Systolic Blood Pressure: 136 mmHg      Is BP treated: No      HDL Cholesterol: 51.2 mg/dL      Total Cholesterol: 202 mg/dL     Intrinsic eczema 91/96/7977   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   Major depressive disorder 11/14/2012   Malignant neoplasm of liver 11/14/2012   Migraine 07/02/2015   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 Pt's mother confirmed not much has changed since she last met with BSW. Pt's mother states she is not getting any cooperation from her mother at all. Pt's mother states they went over yesterday and spent a lot the time arguing. Pt's mother states her mother told her she has someone who will be helping her with medications, rides to doctors, etc, and pt pays out of pocket for this. Pt's mother is unsure what else she can do. BSW reminded her of the resources provided via email and pt's mother confirmed she still has them. Pt's mother states her mother has pushed a lot of people away from her life that  can help, including herself, which has made it difficult to provide support that she needs. At this time, BSW will be dropping from care team and pt's mother agreed and stated she would reach out if additional needs arise. Responsible staff member will be notified as well.   SDOH Interventions    Flowsheet Row Patient Outreach Telephone from 03/22/2024 in [place] POPULATION HEALTH DEPARTMENT Patient Outreach Telephone from 02/22/2024 in [place] POPULATION HEALTH DEPARTMENT Patient Outreach Telephone from 02/09/2024 in [place] POPULATION HEALTH DEPARTMENT Patient Outreach Telephone from 01/25/2024 in [place] POPULATION HEALTH DEPARTMENT Clinical Support from 12/30/2023 in [facility] Coordination from  04/30/2023 in [facility] Coordination  SDOH Interventions        Food Insecurity Interventions Intervention Not Indicated Intervention Not Indicated -- Intervention Not Indicated Intervention Not Indicated Intervention Not Indicated  Housing Interventions Intervention Not Indicated Intervention Not Indicated -- Intervention Not Indicated Intervention Not Indicated --  Transportation Interventions Intervention Not Indicated Intervention Not Indicated Intervention Not Indicated -- Intervention Not Indicated Intervention Not Indicated  Utilities Interventions Intervention Not Indicated Intervention Not Indicated -- -- Intervention Not Indicated Intervention Not Indicated  Alcohol Usage Interventions -- -- -- -- Intervention Not Indicated (Score <7) --  Depression Interventions/Treatment  -- Medication, Currently on Treatment Medication, Counseling, Currently on Treatment Medication, Currently on Treatment Medication --  Financial Strain Interventions Intervention Not Indicated -- -- -- Intervention Not Indicated --  Physical Activity Interventions -- -- -- -- Intervention Not Indicated --  Stress Interventions -- -- Programmer, applications Provided, Education officer, environmental Provided, Provide Counseling Intervention Not Indicated --  Social Connections Interventions -- -- -- -- Intervention Not Indicated --  Health Literacy Interventions -- -- -- -- Intervention Not Indicated --      Recommendation:   None at this time.   Follow Up Plan:   No further f/u appts.   Laymon Doll, BSW [place]/VBCI - [employer] Social Worker [phone]

## 2024-04-03 ENCOUNTER — Telehealth (HOSPITAL_COMMUNITY): Payer: Self-pay | Admitting: Psychiatry

## 2024-04-03 ENCOUNTER — Other Ambulatory Visit: Payer: Self-pay | Admitting: *Deleted

## 2024-04-03 ENCOUNTER — Ambulatory Visit (HOSPITAL_COMMUNITY): Admitting: Psychiatry

## 2024-04-03 DIAGNOSIS — E612 Magnesium deficiency: Principal | ICD-10-CM

## 2024-04-03 DIAGNOSIS — Z5181 Encounter for therapeutic drug level monitoring: Principal | ICD-10-CM

## 2024-04-03 DIAGNOSIS — Z944 Liver transplant status: Principal | ICD-10-CM
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
## 2024-04-03 NOTE — Patient Outreach (Signed)
 Complex Care Management   Visit Note  04/03/2024  Name:  Dayja Loveridge MRN: [id number] DOB: November 28, 1945  Situation: Referral received for Complex Care Management related to Dementia I obtained verbal consent from daughter April.  Visit completed with daughter April  on the phone  Background:   Past Medical History:  Diagnosis Date   Abdominal pain    Acute cystitis without hematuria 03/12/2022   Allergic rhinitis 11/14/2012   Amblyopia of eye, left 06/17/2017   Asbestos exposure 04/08/2018   Asthma    8005-8005 due to black mold house, no asthma now   Atherosclerosis of aorta 01/28/2023   Broken ankle    right   Broken rib    x2   Cataract    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 03/26/2011   Chronic hepatitis C virus infection 05/31/2013   Complication of anesthesia    IV phenergan   serious mental reaction   Delusional thoughts 03/18/2022   Diarrhea 03/13/2022   Dysuria 07/29/2023   Essential hypertension 10/14/2017   Gastroesophageal reflux disease 10/14/2017   Generalized anxiety disorder    Hernia, femoral    left   History of blood transfusion 1971   acquired hepatitis c   History of kidney stones    History of liver transplant 11/14/2012   History of nephrolithiasis 10/14/2017   History of shingles    Hyperglycemia 04/08/2018   Hyperlipidemia 01/15/2021   The 10-year ASCVD risk score (Arnett DK, et al., 2019) is: 16.2%    Values used to calculate the score:      Age: 78 years      Sex: Female      Is Non-Hispanic African American: No      Diabetic: No      Tobacco smoker: No      Systolic Blood Pressure: 136 mmHg      Is BP treated: No      HDL Cholesterol: 51.2 mg/dL      Total Cholesterol: 202 mg/dL     Intrinsic eczema 91/96/7977   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   Major depressive disorder 11/14/2012   Malignant neoplasm of liver 11/14/2012   Migraine 07/02/2015   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    vascular/medical; concerns for concurrent Alzheimer's disease   Overweight (BMI 25.0-29.9) 06/28/2015   PCO (posterior capsular opacification), right 03/26/2011   Seasonal affective disorder    Splenic artery aneurysm    Stage 4 chronic kidney disease 07/29/2023   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 Vukelich's daughter, April, who expressed concern regarding her mother's current condition. April reported that Mrs. Stanczak is refusing to speak with her and has informed her that she now has a new caregiver. April stated she does not know who this caregiver is.  Due to the lack of communication with her mother, April was unable to assist in completing the assessment or provide accurate screening information at this time. She expressed concern that her mother's dementia may be worsening.  April also shared that she has contacted her mother's B[facility] provider to request a competency evaluation but has not yet received a response. I informed April that I would notify Dr. Joshua of the current  situation and ask if he would be willing to assist in facilitating a competency evaluation for [name]. Patient Reported Symptoms:  Cognitive        Neurological      HEENT        Cardiovascular      Respiratory      Endocrine      Gastrointestinal        Genitourinary      Integumentary      Musculoskeletal          Psychosocial            04/03/2024    PHQ2-9 Depression Screening   Little interest or pleasure in doing things    Feeling down, depressed, or hopeless    PHQ-2 - Total Score    Trouble falling or staying asleep, or sleeping too much    Feeling tired or having little energy    Poor appetite or overeating     Feeling bad about  yourself - or that you are a failure or have let yourself or your family down    Trouble concentrating on things, such as reading the newspaper or watching television    Moving or speaking so slowly that other people could have noticed.  Or the opposite - being so fidgety or restless that you have been moving around a lot more than usual    Thoughts that you would be better off dead, or hurting yourself in some way    PHQ2-9 Total Score    If you checked off any problems, how difficult have these problems made it for you to do your work, take care of things at home, or get along with other people    Depression Interventions/Treatment      There were no vitals filed for this visit.  Medications Reviewed Today   Medications were not reviewed in this encounter     Recommendation:   PCP Follow-up Specialty provider follow-up 04/12/24  Follow Up Plan:   Telephone follow-up in 1 week 04/14/24 @ 1130 am  Cristen Bredeson, RN, Scientist, research (physical sciences), Theatre manager [employer] [phone]

## 2024-04-03 NOTE — Telephone Encounter (Signed)
 Patient did not show up for the appointment.  Attempted to call the patient but received no response. Mailbox to leave voicemail was not set up. Front desk staff informed me that she forgot about today's appointment and she plans to make it to her medication management appointment on 10/29.  Ismael Franco, MD PGY-3 Psychiatry Resident

## 2024-04-03 NOTE — Patient Instructions (Signed)
 Visit Information  Thank you for taking time to visit with me today. Please don't hesitate to contact me if I can be of assistance to you before our next scheduled appointment.  Your next care management appointment is by telephone on 04/14/24 at 1130 am  Please call the care guide team at [phone] if you need to cancel, schedule, or reschedule an appointment.   Please call the Suicide and Crisis Lifeline: 988 call the [place]  National Suicide Prevention Lifeline: 475-434-1430 or TTY: 507-186-2277 TTY (410)059-2385) to talk to a trained counselor call 1-800-273-TALK (toll free, 24 hour hotline) go to [facility] [address] [place] [phone]) call the [facility] Crisis Line: 8730735708 call 911 if you are experiencing a Mental Health or Behavioral Health Crisis or need someone to talk to.  Elainah Rhyne, RN, BSN, Theatre manager [employer] [phone]

## 2024-04-04 NOTE — Unmapped (Signed)
 [facility] Refill Coordination Note    Specialty Medication(s) to be Shipped:   Transplant: tacrolimus  0.5mg     Other medication(s) to be shipped: No additional medications requested for fill at this time    Specialty Medications not needed at this time: N/A     Kristy Hanson, DOB: Jun 23, 1945  Phone: [phone] (home)       All above HIPAA information was verified with patient.     Was a Nurse, learning disability used for this call? No    Completed refill call assessment today to schedule patient's medication shipment from the [facility]  [phone]).  All relevant notes have been reviewed.     Specialty medication(s) and dose(s) confirmed: Regimen is correct and unchanged.   Changes to medications: Torrie reports no changes at this time.  Changes to insurance: No  New side effects reported not previously addressed with a pharmacist or physician: None reported  Questions for the pharmacist: No    Confirmed patient received a Conservation officer, historic buildings and a Surveyor, mining with first shipment. The patient will receive a drug information handout for each medication shipped and additional FDA Medication Guides as required.       DISEASE/MEDICATION-SPECIFIC INFORMATION        N/A    SPECIALTY MEDICATION ADHERENCE     Medication Adherence    Patient reported X missed doses in the last month: 0  Specialty Medication: tacrolimus  0.5 MG capsule (PROGRAF )  Patient is on additional specialty medications: No              Were doses missed due to medication being on hold? No    tacrolimus  0.5 MG capsule (PROGRAF ) 10 days of medicine on hand       REFERRAL TO PHARMACIST     Referral to the pharmacist: Not needed      [facility]     Shipping address confirmed in Epic.     Cost and Payment: Patient has a copay of $47.00 . They are aware and have authorized the pharmacy to charge the credit card on file.    Delivery Scheduled: Yes, Expected medication delivery date: 04/11/2024. Medication will be delivered via UPS to the prescription address in Epic WAM.    [facility]  Specialty Technician

## 2024-04-06 ENCOUNTER — Ambulatory Visit: Admitting: Internal Medicine

## 2024-04-10 MED FILL — TACROLIMUS 0.5 MG CAPSULE, IMMEDIATE-RELEASE: ORAL | 30 days supply | Qty: 180 | Fill #5

## 2024-04-12 ENCOUNTER — Ambulatory Visit (HOSPITAL_COMMUNITY): Admitting: Psychiatry

## 2024-04-12 VITALS — BP 137/73 | Wt 124.2 lb

## 2024-04-12 DIAGNOSIS — F411 Generalized anxiety disorder: Secondary | ICD-10-CM

## 2024-04-12 DIAGNOSIS — F331 Major depressive disorder, recurrent, moderate: Secondary | ICD-10-CM

## 2024-04-12 MED ORDER — SERTRALINE HCL 100 MG PO TABS: 150.0000 mg | ORAL_TABLET | Freq: Every day | ORAL | 1 refills | Status: DC

## 2024-04-12 NOTE — Patient Instructions (Addendum)
 Med list:  aspirin  EC 81 MG tablet 81 mg, Daily        atorvastatin  (LIPITOR) 10 MG tablet 10 mg, Daily       Cholecalciferol (VITAMIN D3) 25 MCG (1000 UT) CAPS 1,000 Units, Daily       Ferrous Sulfate  (IRON) 325 (65 Fe) MG TABS 1 tablet, Daily       folic acid  (FOLVITE ) 400 MCG tablet 800 mcg, Daily       memantine  (NAMENDA  XR) 21 MG CP24 24 hr capsule        sertraline  (ZOLOFT ) 100 MG tablet 150 mg, Daily          tacrolimus  (PROGRAF ) 0.5 MG capsule

## 2024-04-12 NOTE — Addendum Note (Signed)
 Addended by: CARVIN CROCK on: 04/12/2024 04:14 PM   Modules accepted: Level of Service

## 2024-04-14 ENCOUNTER — Other Ambulatory Visit: Payer: Self-pay | Admitting: *Deleted
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
## 2024-04-15 ENCOUNTER — Other Ambulatory Visit (HOSPITAL_COMMUNITY): Payer: Self-pay | Admitting: Psychiatry

## 2024-04-15 DIAGNOSIS — F411 Generalized anxiety disorder: Secondary | ICD-10-CM

## 2024-04-15 DIAGNOSIS — F331 Major depressive disorder, recurrent, moderate: Secondary | ICD-10-CM

## 2024-05-01 ENCOUNTER — Ambulatory Visit: Admitting: Internal Medicine
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
## 2024-05-03 ENCOUNTER — Ambulatory Visit: Admitting: Physician Assistant

## 2024-05-03 ENCOUNTER — Encounter: Payer: Self-pay | Admitting: Physician Assistant

## 2024-05-03 DIAGNOSIS — Z029 Encounter for administrative examinations, unspecified: Secondary | ICD-10-CM

## 2024-05-09 ENCOUNTER — Ambulatory Visit: Payer: Self-pay | Admitting: Internal Medicine

## 2024-05-09 ENCOUNTER — Ambulatory Visit (INDEPENDENT_AMBULATORY_CARE_PROVIDER_SITE_OTHER): Admitting: Internal Medicine

## 2024-05-09 ENCOUNTER — Encounter: Payer: Self-pay | Admitting: Internal Medicine

## 2024-05-09 VITALS — BP 132/74 | HR 67 | Temp 98.1°F | Resp 16 | Ht 63.0 in | Wt 124.8 lb

## 2024-05-09 DIAGNOSIS — Z23 Encounter for immunization: Secondary | ICD-10-CM

## 2024-05-09 DIAGNOSIS — N184 Chronic kidney disease, stage 4 (severe): Secondary | ICD-10-CM

## 2024-05-09 DIAGNOSIS — Z0000 Encounter for general adult medical examination without abnormal findings: Secondary | ICD-10-CM | POA: Diagnosis not present

## 2024-05-09 DIAGNOSIS — D508 Other iron deficiency anemias: Secondary | ICD-10-CM

## 2024-05-09 DIAGNOSIS — I10 Essential (primary) hypertension: Secondary | ICD-10-CM

## 2024-05-09 DIAGNOSIS — N1832 Chronic kidney disease, stage 3b: Secondary | ICD-10-CM

## 2024-05-09 DIAGNOSIS — Z0001 Encounter for general adult medical examination with abnormal findings: Secondary | ICD-10-CM | POA: Insufficient documentation

## 2024-05-09 DIAGNOSIS — E782 Mixed hyperlipidemia: Secondary | ICD-10-CM | POA: Diagnosis not present

## 2024-05-09 DIAGNOSIS — E785 Hyperlipidemia, unspecified: Secondary | ICD-10-CM | POA: Insufficient documentation

## 2024-05-09 LAB — CBC WITH DIFFERENTIAL/PLATELET
Basophils Absolute: 0 K/uL (ref 0.0–0.1)
Basophils Relative: 0.7 % (ref 0.0–3.0)
Eosinophils Absolute: 0.2 K/uL (ref 0.0–0.7)
Eosinophils Relative: 2.1 % (ref 0.0–5.0)
HCT: 38.7 % (ref 36.0–46.0)
Hemoglobin: 13.3 g/dL (ref 12.0–15.0)
Lymphocytes Relative: 19.1 % (ref 12.0–46.0)
Lymphs Abs: 1.4 K/uL (ref 0.7–4.0)
MCHC: 34.4 g/dL (ref 30.0–36.0)
MCV: 90.5 fl (ref 78.0–100.0)
Monocytes Absolute: 0.6 K/uL (ref 0.1–1.0)
Monocytes Relative: 8.5 % (ref 3.0–12.0)
Neutro Abs: 5.2 K/uL (ref 1.4–7.7)
Neutrophils Relative %: 69.6 % (ref 43.0–77.0)
Platelets: 192 K/uL (ref 150.0–400.0)
RBC: 4.28 Mil/uL (ref 3.87–5.11)
RDW: 13.7 % (ref 11.5–15.5)
WBC: 7.4 K/uL (ref 4.0–10.5)

## 2024-05-09 LAB — BASIC METABOLIC PANEL WITH GFR
BUN: 17 mg/dL (ref 6–23)
CO2: 29 meq/L (ref 19–32)
Calcium: 9.7 mg/dL (ref 8.4–10.5)
Chloride: 104 meq/L (ref 96–112)
Creatinine, Ser: 1.09 mg/dL (ref 0.40–1.20)
GFR: 49.33 mL/min — ABNORMAL LOW (ref >60.00)
Glucose, Bld: 89 mg/dL (ref 70–99)
Potassium: 3.8 meq/L (ref 3.5–5.1)
Sodium: 142 meq/L (ref 135–145)

## 2024-05-09 LAB — IBC + FERRITIN
Ferritin: 490.5 ng/mL — ABNORMAL HIGH (ref 10.0–291.0)
Iron: 68 ug/dL (ref 42–145)
Saturation Ratios: 25 % (ref 20.0–50.0)
TIBC: 271.6 ug/dL (ref 250.0–450.0)
Transferrin: 194 mg/dL — ABNORMAL LOW (ref 212.0–360.0)

## 2024-05-09 LAB — HEPATIC FUNCTION PANEL
ALT: 9 U/L (ref 0–35)
AST: 15 U/L (ref 0–37)
Albumin: 4.7 g/dL (ref 3.5–5.2)
Alkaline Phosphatase: 55 U/L (ref 39–117)
Bilirubin, Direct: 0.1 mg/dL (ref 0.0–0.3)
Total Bilirubin: 0.4 mg/dL (ref 0.2–1.2)
Total Protein: 7.2 g/dL (ref 6.0–8.3)

## 2024-05-09 LAB — LIPID PANEL
Cholesterol: 237 mg/dL — ABNORMAL HIGH (ref 0–200)
HDL: 48.5 mg/dL (ref >39.00)
LDL Cholesterol: 163 mg/dL — ABNORMAL HIGH (ref 0–99)
NonHDL: 188.01
Total CHOL/HDL Ratio: 5
Triglycerides: 124 mg/dL (ref 0.0–149.0)
VLDL: 24.8 mg/dL (ref 0.0–40.0)

## 2024-05-09 LAB — TSH: TSH: 3.46 u[IU]/mL (ref 0.35–5.50)

## 2024-05-09 MED ORDER — ATORVASTATIN CALCIUM 10 MG PO TABS: 10.0000 mg | ORAL_TABLET | Freq: Every day | ORAL | 1 refills | Status: AC

## 2024-05-09 NOTE — Patient Instructions (Signed)

## 2024-05-09 NOTE — Progress Notes (Signed)
 Subjective:  Patient ID: Candace Hill, female    DOB: 03/06/1946  Age: 78 y.o. MRN: [id number]  CC: Annual Exam, Anemia, and Hypertension   HPI [name] presents for a CPX and f/up ----  Discussed the use of AI scribe software for clinical note transcription with the patient, who gave verbal consent to proceed.  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 This has led to depression as she can no longer drive, necessitating hiring someone to take her to appointments.  She reports weight loss, which has been noted by several doctors. No nausea, vomiting, abdominal pain, constipation, or diarrhea. Bowel movements are inconsistent and influenced by her depression medication, which she must take daily. She experiences urgency and sometimes wears protective liners due to this issue.  She recalls being told by a kidney specialist that she was at stage 3, and at other times has been told she is at stage 4. She recalls being informed of her condition by a kidney specialist, who indicated she was at stage 3, though she has been told she is at stage 4 at times. She does not recall the exact timing of her last kidney function test due to memory issues.  No swelling in her legs or feet. She has not received a flu shot yet but is willing to get one today. She expresses concern about receiving the COVID vaccine due to a past experience of contracting COVID after vaccination.     Outpatient Medications Prior to Visit   Medication Sig Dispense Refill   aspirin  EC 81 MG tablet Take 81 mg by mouth daily.     Cholecalciferol (VITAMIN D3) 25 MCG (1000 UT) CAPS Take 1 capsule (1,000 Units total) by mouth daily. 90 capsule 0   Ferrous Sulfate  (IRON) 325 (65 Fe) MG TABS Take 1 tablet by mouth daily.     folic acid  (FOLVITE ) 400 MCG tablet Take 800 mcg by mouth daily.     memantine  (NAMENDA  XR) 21 MG CP24 24 hr capsule TAKE 1 CAPSULE(21 MG) BY MOUTH DAILY 90 capsule 0   sertraline  (ZOLOFT ) 100 MG tablet TAKE 1.5 TABLET BY MOUTH DAILY. TAKE WITH 50 MG TABLET BY MOUTH DAILY 135 tablet 0   tacrolimus  (PROGRAF ) 0.5 MG capsule Take 0.5 mg by mouth 2 (two) times daily. 6 by mouth daily NAME BRAND ONLY     atorvastatin  (LIPITOR) 10 MG tablet TAKE 1 TABLET EVERY DAY 90 tablet 1   No facility-administered medications prior to visit.    ROS Review of Systems  Constitutional:  Negative for appetite change, chills, diaphoresis, fatigue and fever.  HENT: Negative.    Eyes:  Positive for visual disturbance. Negative for photophobia, pain and redness.  Respiratory: Negative.  Negative for cough, chest tightness, shortness of breath and wheezing.   Cardiovascular:  Negative for chest pain, palpitations and leg swelling.  Gastrointestinal:  Negative for abdominal pain, constipation, diarrhea, nausea and vomiting.  Endocrine: Negative.   Genitourinary: Negative.  Negative for difficulty urinating, dysuria and hematuria.  Musculoskeletal: Negative.  Negative for arthralgias and myalgias.  Skin: Negative.   Neurological:  Negative for dizziness and numbness.  Hematological:  Negative for adenopathy. Does not bruise/bleed easily.  Psychiatric/Behavioral:  Positive for confusion and decreased concentration.     Objective:  BP 132/74 (BP Location: Right Arm, Patient Position: Sitting, Cuff Size: Normal)   Pulse 67   Temp 98.1 F (36.7 C) (Oral)   Resp 16   Ht 5' 3 (1.6 m)   Wt 124 lb 12.8 oz (56.6 kg)   SpO2 97%   BMI  22.11 kg/m   BP Readings from Last 3 Encounters:  05/09/24 132/74  02/22/24 124/70  01/26/24 123/73    Wt Readings from Last 3 Encounters:  05/09/24 124 lb 12.8 oz (56.6 kg)  01/11/24 127 lb (57.6 kg)  01/05/24 126 lb 12.8 oz (57.5 kg)    Physical Exam Vitals reviewed.  Constitutional:      Appearance: Normal appearance.  HENT:     Nose: Nose normal.     Mouth/Throat:     Mouth: Mucous membranes are moist.  Eyes:     General: No scleral icterus.    Conjunctiva/sclera: Conjunctivae normal.  Cardiovascular:     Rate and Rhythm: Normal rate and regular rhythm.     Heart sounds: No murmur heard.    No friction rub. No gallop.  Pulmonary:     Effort: Pulmonary effort is normal.     Breath sounds: No stridor. No wheezing, rhonchi or rales.  Abdominal:     General: Abdomen is flat.     Palpations: There is no mass.     Tenderness: There is no abdominal tenderness. There is no guarding.     Hernia: No hernia is present.  Musculoskeletal:        General: No swelling. Normal range of motion.     Cervical back: Neck supple.  Lymphadenopathy:     Cervical: No cervical adenopathy.  Skin:    General: Skin is warm and dry.     Coloration: Skin is not jaundiced.  Neurological:     General: No focal deficit present.     Mental Status: She is alert.  Psychiatric:        Attention and Perception: She is inattentive.        Mood and Affect: Mood normal.        Speech: Speech is tangential.        Behavior: Behavior normal.        Thought Content: Thought content normal.        Cognition and Memory: Cognition is impaired. Memory is impaired.     Lab Results  Component Value Date   WBC 7.4 05/09/2024   HGB 13.3 05/09/2024   HCT 38.7 05/09/2024   PLT 192.0 05/09/2024   GLUCOSE 89 05/09/2024   CHOL 237 (H) 05/09/2024   TRIG 124.0 05/09/2024   HDL 48.50 05/09/2024   LDLCALC 163 (H) 05/09/2024   ALT 9 05/09/2024   AST 15 05/09/2024   NA 142 05/09/2024   K 3.8 05/09/2024    CL 104 05/09/2024   CREATININE 1.09 05/09/2024   BUN 17 05/09/2024   CO2 29 05/09/2024   TSH 3.46 05/09/2024   INR 0.9 04/15/2021   HGBA1C 5.4 03/12/2022    CT CARDIAC SCORING Addendum Date: 12/01/2023 ADDENDUM REPORT: 12/01/2023 20:56 EXAM: OVER-READ INTERPRETATION  CT CHEST The following report is an over-read performed by radiologist Dr. Morgane Naveauof [facility] on 12/01/2023. This over-read does not  include interpretation of cardiac or coronary anatomy or pathology. The CT cardiac coronary artery interpretation by the cardiologist is attached. COMPARISON:  CT angio abdomen 03/31/2023 FINDINGS: Lingular linear atelectasis versus scarring. Visualized portions of the lungs are clear. No lymphadenopathy. Atherosclerotic plaque. No acute upper abdominal abnormality. No acute osseous abnormality. Multilevel degenerative change of the spine. Surgical clips noted within the upper abdomen. No acute soft tissue abnormality. IMPRESSION: Otherwise no acute abnormality. Electronically Signed   By: Morgane  Naveau M.D.   On: 12/01/2023 20:56   Result Date: 12/01/2023 : CLINICAL DATA:  Cardiovascular Disease Risk stratification EXAM: Coronary Calcium  Score TECHNIQUE: A gated, non-contrast computed tomography scan of the heart was performed using 3mm slice thickness. Axial images were analyzed on a dedicated workstation. Calcium  scoring of the coronary arteries was performed using the Agatston method. FINDINGS: Coronary Calcium  Score: Left main: 0 Left anterior descending artery: 259 Left circumflex artery: 0 Right coronary artery: 14 Total: 273 Percentile: 76 Pericardium: Normal. Ascending Aorta: Normal caliber. Mild calcifications of the descending thoracic aorta noted Pulmonary artery: Normal caliber Non-cardiac: See separate report from [facility]. IMPRESSION: Coronary calcium  score of 273. This was 83 percentile for age-, race-, and sex-matched controls. RECOMMENDATIONS: Coronary  artery calcium  (CAC) score is a strong predictor of incident coronary heart disease (CHD) and provides predictive information beyond traditional risk factors. CAC scoring is reasonable to use in the decision to withhold, postpone, or initiate statin therapy in intermediate-risk or selected borderline-risk asymptomatic adults (age 91-75 years and LDL-C >=70 to <190 mg/dL) who do not have diabetes or established atherosclerotic cardiovascular disease (ASCVD).* In intermediate-risk (10-year ASCVD risk >=7.5% to <20%) adults or selected borderline-risk (10-year ASCVD risk >=5% to <7.5%) adults in whom a CAC score is measured for the purpose of making a treatment decision the following recommendations have been made: If CAC=0, it is reasonable to withhold statin therapy and reassess in 5 to 10 years, as long as higher risk conditions are absent (diabetes mellitus, family history of premature CHD in first degree relatives (males <55 years; females <65 years), cigarette smoking, or LDL >=190 mg/dL). If CAC is 1 to 99, it is reasonable to initiate statin therapy for patients >=21 years of age. If CAC is >=100 or >=75th percentile, it is reasonable to initiate statin therapy at any age. Cardiology referral should be considered for patients with CAC scores >=400 or >=75th percentile. *2018 AHA/ACC/AACVPR/AAPA/ABC/ACPM/ADA/AGS/APhA/ASPC/NLA/PCNA Guideline on the Management of Blood Cholesterol: A Report of the American College of Cardiology/American Heart Association Task Force on Clinical Practice Guidelines. J Am Coll Cardiol. 2019;73(24):3168-3209. Electronically Signed: By: Lamar Fitch M.D. On: 10/31/2023 18:13   Estimated Creatinine Clearance: 36.3 mL/min (by C-G formula based on SCr of 1.09 mg/dL).   The 10-year ASCVD risk score (Arnett DK, et al., 2019) is: 18.8%   Values used to calculate the score:     Age: 77 years     Clincally relevant sex: Female     Is Non-Hispanic African American: No      Diabetic: No     Tobacco smoker: No     Systolic Blood Pressure: 132 mmHg     Is BP treated: No     HDL Cholesterol: 48.5 mg/dL     Total Cholesterol: 237 mg/dL   Assessment & Plan:   Essential hypertension- BP is well controlled. -     Basic metabolic panel with GFR; Future -     TSH; Future  Other iron deficiency anemia- H/H are normal. -  IBC + Ferritin; Future -     CBC with Differential/Platelet; Future  Mixed hyperlipidemia -     Lipid panel; Future -     Hepatic function panel; Future  Immunization due -     Flu vaccine HIGH DOSE PF(Fluzone  Trivalent)  Encounter for general adult medical examination with abnormal findings- Exam completed, labs reviewed, vaccines reviewed and updated, no cancer screenings indicated, pt ed material was given.   Hyperlipidemia with target LDL less than 130 -     Atorvastatin  Calcium ; Take 1 tablet (10 mg total) by mouth daily.  Dispense: 90 tablet; Refill: 1  Stage 3b chronic kidney disease (HCC)- Will avoid nephrotoxic agents      Follow-up: Return in about 6 months (around 11/06/2024).  Debby Molt, MD

## 2024-05-09 NOTE — Progress Notes (Signed)
 [facility] has made a second and final attempt to reach this patient to refill the following medication:tacrolimus  0.5 MG capsule (PROGRAF ).      We have left voicemails on the following phone numbers: [phone], have been unable to leave messages on the following phone numbers: [phone], and have sent a MyChart message.    Dates contacted: 05/02/2024- 05/09/2024  Last scheduled delivery: 04/10/2024    The patient may be at risk of non-compliance with this medication. The patient should call the [facility] at [phone]  Option 4, then Option 4: Infectious Disease, Transplant to refill medication.    Kristy Hanson   [facility]

## 2024-05-16 ENCOUNTER — Telehealth: Payer: Self-pay

## 2024-05-16 ENCOUNTER — Ambulatory Visit: Payer: Self-pay

## 2024-05-16 NOTE — Telephone Encounter (Signed)
 Copied from CRM [id number]. Topic: Clinical - Lab/Test Results >> May 16, 2024  2:50 PM Alfonso HERO wrote: Reason for CRM: patient returning nurse call for results. She is home now and requesting a callback. >> May 16, 2024  4:17 PM Fredrica W wrote: Patient called back. Returned missed call from provider. Trying to see who called. Read note as written from provider. Patient understood. Who like to know what number kidney function improved to. Does not used Mychart due to losing eyesight.

## 2024-05-16 NOTE — Telephone Encounter (Signed)
 Reason for Disposition . Health information question, no triage required and triager able to answer question  Answer Assessment - Initial Assessment Questions This RN spoke with pt who states she already spoke with someone earlier who gave her the lab results. All questions answered at this time.  Protocols used: Information Only Call - No Triage-A-AH

## 2024-05-16 NOTE — Telephone Encounter (Signed)
    Copied from CRM [id number]. Topic: Clinical - Lab/Test Results >> May 16, 2024  2:50 PM Alfonso HERO wrote: Reason for CRM: patient returning nurse call for results. She is home now and requesting a callback.

## 2024-05-17 ENCOUNTER — Ambulatory Visit (HOSPITAL_COMMUNITY): Admitting: Psychiatry

## 2024-05-17 NOTE — Telephone Encounter (Signed)
 Patient has made me aware that she is aware of her lab results and all of her questions has been answered.

## 2024-05-17 NOTE — Telephone Encounter (Signed)
 Patient has made me aware that somebody from the office (she couldn't remember their name) Had already discussed her labs results with her and gave her Dr. Joshua comments. I gave her a verbal understanding.

## 2024-05-19 ENCOUNTER — Other Ambulatory Visit: Admitting: *Deleted

## 2024-05-19 NOTE — Patient Instructions (Signed)
 [name] - I am sorry I was unable to reach you today for our scheduled appointment. I work with Joshua Debby CROME, MD and am calling to support your healthcare needs.   I have rescheduled your appointment for 07/05/24 @ 11 am. Please contact me at [phone] at your earliest convenience. I look forward to speaking with you soon.   Thank you,  Merly Hinkson, RN, BSN, ACM RN Care Manager [employer] [phone]
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
## 2024-05-24 ENCOUNTER — Ambulatory Visit (HOSPITAL_COMMUNITY): Admitting: Psychiatry

## 2024-05-24 DIAGNOSIS — F411 Generalized anxiety disorder: Secondary | ICD-10-CM

## 2024-05-24 NOTE — Progress Notes (Signed)
°  [facility] [address] [place] [address] [place] [address] Dept: [phone] Dept Fax: [phone]  Psychotherapy Progress Note  Patient ID: Candace Hill, female  DOB: 04-30-1946, 78 y.o.  MRN: [id number]  05/24/2024 Start time: 8 AM End time: 9 AM  Method of Visit: Face-to-Face  Present: patient  Current Concerns: tension with caregiver, romantic conflict  Current Symptoms: Anxiety  Psychiatric Specialty Exam: General Appearance: Casual and Fairly Groomed  Eye Contact:  Good  Speech:  Clear and Coherent and Normal Rate  Volume:  Normal  Mood:  Anxious  Affect:  Congruent  Thought Process:  Coherent  Orientation:  Full (Time, Place, and Person)  Thought Content:  Logical  Suicidal Thoughts:  No  Homicidal Thoughts:  No  Memory:  Remote;   Good  Judgement:  Fair  Insight:  Fair  Psychomotor Activity:  Normal  Concentration:  Concentration: Fair  Recall:  Fair  Fund of Knowledge:Fair  Language: Fair  Akathisia:  No  Handed:  Right  AIMS (if indicated):  not done  Assets:  Communication Skills Desire for Improvement Housing Resilience  ADL's:  Intact  Cognition: WNL  Sleep:  Fair    Diagnosis: GAD  Anticipated Frequency of Visits: every other week Anticipated Length of Treatment Episode: 6 months  Short Term Goals/Goals for Treatment Session:   1) decrease anxiety 2) resist flight/freeze response 3) identify anxiety inducing thoughts 4) confrontation skills with caregiver and female friend   Progress Towards Goals: Progressing AEB practicing reframing thoughts  Treatment Intervention: Supportive therapy  Medical Necessity: Improved patient condition  Assessment Tools:    02/09/2024   11:38 AM 01/26/2024    9:02 AM 12/30/2023    1:26 PM  Depression screen PHQ 2/9  Decreased Interest 2 2 0  Down, Depressed, Hopeless 2 2 1   PHQ - 2 Score 4 4 1   Altered sleeping 3 0 0  Tired,  decreased energy 3 2 1   Change in appetite 2 2 0  Feeling bad or failure about yourself  1 1 0  Trouble concentrating 3 3 3   Moving slowly or fidgety/restless 2 0   Suicidal thoughts 0 0 0  PHQ-9 Score 18  12  5    Difficult doing work/chores  Somewhat difficult Somewhat difficult     Data saved with a previous flowsheet row definition   Failed to redirect to the Timeline version of the REVFS SmartLink. Flowsheet Row ED from 09/10/2023 in [facility] Emergency Department at [facility] ED from 08/26/2023 in [facility] Emergency Department at [facility] Counselor from 12/30/2022 in [facility] Outpatient Behavioral Health at [facility] RISK CATEGORY No Risk No Risk Low Risk    Collaboration of Care: Medication Management AEB myself  Patient/Guardian was advised Release of Information must be obtained prior to any record release in order to collaborate their care with an outside provider. Patient/Guardian was advised if they have not already done so to contact the registration department to sign all necessary forms in order for us  to release information regarding their care.   Consent: Patient/Guardian gives verbal consent for treatment and assignment of benefits for services provided during this visit. Patient/Guardian expressed understanding and agreed to proceed.   Plan: f/u in 1 month, practice when you, I feel statements  Ismael KATHEE Franco, MD 05/24/2024

## 2024-05-24 NOTE — Addendum Note (Signed)
 Addended by: CARVIN CROCK on: 05/24/2024 11:20 AM   Modules accepted: Level of Service

## 2024-05-25 NOTE — Progress Notes (Addendum)
 12/15: patient called in and spoke with another [facility] team member. She is aware daughter has already set up delivery. She said going forward her daughter may not be helping with medications but it is still ok to contact her daughter on 2nd/3rd attempts if we cannot reach patient. Clinic team is aware -[facility] Clinical Assessment & Refill Coordination Note    Kristy Hanson, DOB: [birth year]  Phone: [phone] (home)     All above HIPAA information was verified with patient's family member, DAUGHTER.     Was a nurse, learning disability used for this call? 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    Medication list has been reviewed and updated in Epic: Yes  1 high priority alert noted on med rec today: high dose vit d- clinic notified (not at [facility])    Allergies[2]    Changes to allergies: No    Allergies have been reviewed and updated in Epic: yes    SPECIALTY MEDICATION ADHERENCE     Tacrolimus  0.5mg   : 5 days of medicine on hand     Medication Adherence    Patient reported X missed doses in the last month: 0  Specialty Medication: tacrolimus  0.5mg   Patient is on additional specialty medications: No          Specialty medication(s) dose(s) confirmed: Regimen is correct and unchanged.     Are there any concerns with adherence? No    Adherence counseling provided? Not needed    CLINICAL MANAGEMENT AND INTERVENTION      Clinical Benefit Assessment:    Do you feel the medicine is effective or helping your condition? Yes    Clinical Benefit counseling provided? Not needed    Adverse Effects Assessment:    Are you experiencing any side effects? No    Are you experiencing difficulty administering your medicine? No    Quality of Life Assessment:    Quality of Life    Rheumatology  Oncology  Dermatology  Cystic Fibrosis          How many days over the past month did your transplant keep you from your normal activities? For example, brushing your teeth or getting up in the morning. 0    Have you discussed this with your provider? Not needed    Acute Infection Status:    Acute infections noted within Epic:  No active infections    Patient reported infection: None    Therapy Appropriateness:    Is the medication and dose appropriate considering the patient???s diagnosis, treatment, and disease journey, comorbidities, medical history, current medications, allergies, therapeutic goals, self-administration ability, and access barriers? Yes, therapy is appropriate and should be continued     Clinical Intervention:    Was an intervention completed as part of this clinical assessment? Yes  [place] [facility] - Clinical Pharmacist Intervention   Reason for Intervention Issue Observed Medication adherence          Additional Comments daughter aware we have been unable to reach patient. she is aware to contact clinic for rx sent to local pharmacy if patient will run out before [facility] delivery arrives. note added to also call daughter on all attempts going forward. clinic notified   Intervention Type of Intervention Education performed  Medication delivery scheduled  Recommendation provided, if applicable      Medication Medication(s) Involved tacrolimus    Outcome Expected Result/Outcome of Intervention Improved medication adherence  Improved patient safety    Additional Comments      Follow-up Needed?      Additional Follow-up Comments     Supporting Information Approximate Time Spent on Intervention 0-10 minutes    Clinical Evidence Used Chart review or clinical coordination  Clinical guidelines or Drug information resource  Professional judgement        DISEASE/MEDICATION-SPECIFIC INFORMATION      N/A    Solid Organ Transplant: Not Applicable    PATIENT SPECIFIC NEEDS     Does the patient have any physical, cognitive, or cultural barriers? No    Is the patient high risk? No    Does the patient require physician intervention or other additional services (i.e., nutrition, smoking cessation, social work)? No    Does the patient have an additional or emergency contact listed in their chart? Yes    SOCIAL DETERMINANTS OF HEALTH     At the [facility], we have learned that life circumstances - like trouble affording food, housing, utilities, or transportation can affect the health of many of our patients.   That is why we wanted to ask: are you currently experiencing any life circumstances that are negatively impacting your health and/or quality of life? Patient declined to answer    Social Drivers of Health     Food Insecurity: No Food Insecurity (01/25/2024)    Received from [facility]    Hunger Vital Sign     Within the past 12 months, you worried that your food would run out before you got the money to buy more.: Never true     Within the past 12 months, the food you bought just didn't last and you didn't have money to get more.: Never true   Tobacco Use: Low Risk (02/17/2024)    Patient History     Smoking Tobacco Use: Never     Smokeless Tobacco Use: Never     Passive Exposure: Not on file   Transportation Needs: No Transportation Needs (02/09/2024)    Received from [facility] - Transportation     In the past 12 months, has lack of transportation kept you from medical appointments or from getting medications?: No     In the past 12 months, has lack of transportation kept you from meetings, work, or from getting things needed for daily living?: No   Alcohol Use: Not on file   Housing: Not on file   Physical Activity: Insufficiently Active (12/30/2023)    Received from [facility]    Exercise Vital Sign     On average, how many days per week do you engage in moderate to strenuous exercise (like a brisk walk)?: 5 days     On average, how many minutes do you engage in exercise at this level?: 10 min   Utilities: Not At Risk (12/30/2023)    Received from [facility] Utilities     In the past 12 months has the electric, gas, oil, or water company threatened to shut off services in your home?: No   Stress: Stress Concern Present (02/09/2024)    Received from [facility] - Occupational Stress Questionnaire     Do you feel stress - tense, restless, nervous, or anxious, or unable to sleep at night because your mind  is troubled all the time - these days?: Rather much   Interpersonal Safety: Not At Risk (02/17/2024)    Interpersonal Safety     Unsafe Where You Currently Live: No     Physically Hurt by Anyone: No     Abused by Anyone: No   Substance Use: Not on file (04/24/2023)   Intimate Partner Violence: Not At Risk (01/26/2024)    Received from [facility]    Humiliation, Afraid, Rape, and Kick questionnaire     Within the last year, have you been afraid of your partner or ex-partner?: No     Within the last year, have you been humiliated or emotionally abused in other ways by your partner or ex-partner?: No     Within the last year, have you been kicked, hit, slapped, or otherwise physically hurt by your partner or ex-partner?: No     Within the last year, have you been raped or forced to have any kind of sexual activity by your partner or ex-partner?: No   Social Connections: Moderately Isolated (12/30/2023)    Received from [facility]    Social Connection and Isolation Panel     In a typical week, how many times do you talk on the phone with family, friends, or neighbors?: More than three times a week     How often do you get together with friends or relatives?: Twice a week     How often do you attend church or religious services?: Never     Do you belong to any clubs or organizations such as church groups, unions, fraternal or athletic groups, or school groups?: Yes     How often do you attend meetings of the clubs or organizations you belong to?: Never     Are you married, widowed, divorced, separated, never married, or living with a partner?: Divorced   Physicist, Medical Strain: Low Risk  (12/30/2023)    Received from [facility]    Overall Financial Resource Strain (CARDIA)     How hard is it for you to pay for the very basics like food, housing, medical care, and heating?: Not hard at all   Health Literacy: Inadequate Health Literacy (12/30/2023)    Received from [facility]    B1300 Health Literacy     How often do you need to have someone help you when you read instructions, pamphlets, or other written material from your doctor or pharmacy?: Sometimes   Internet Connectivity: Not on file       Would you be willing to receive help with any of the needs that you have identified today? 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 They are aware and have authorized the pharmacy to charge the credit card on file.    Delivery Scheduled: Yes, Expected medication delivery date: 05/29/2024.     Medication will be delivered via UPS to the confirmed prescription address in [facility].    The patient will receive a drug information handout for each medication shipped and additional FDA Medication Guides as required.  Verified that patient has previously received a Conservation Officer, Historic Buildings and  a Surveyor, Mining.    The patient or caregiver noted above participated in the development of this care plan and knows that they can request review of or adjustments to the care plan at any time.      All of the patient's questions and concerns have been addressed.    Ronal FORBES Reusing, PharmD   [facility] Specialty Pharmacist         [1]   Current Outpatient Medications   Medication Sig Dispense Refill    amlodipine (NORVASC) 2.5 MG tablet Take 1 tablet (2.5 mg total) by mouth daily. (Patient not taking: Reported on 02/17/2024)      aspirin (ECOTRIN) 81 MG tablet Take 1 tablet (81 mg total) by mouth daily.      atorvastatin (LIPITOR) 10 MG tablet Take 1 tablet (10 mg total) by mouth daily.      biotin 1,000 mcg Chew Chew 1,000 mcg  in the morning.      ergocalciferol-1,250 mcg, 50,000 unit, (DRISDOL) 1,250 mcg (50,000 unit) capsule Take 1 capsule (1,250 mcg total) by mouth every seven (7) days. (Patient taking differently: Take 2,000 mcg by mouth in the morning.)      ferrous sulfate 325 (65 FE) MG tablet Take 1 tablet (325 mg total) by mouth in the morning.      folic acid (FOLVITE) 400 MCG tablet Take 2 tablets (800 mcg total) by mouth.      memantine (NAMENDA) 5 MG tablet Take 1 tablet (5 mg total) by mouth two (2) times a day. (Patient taking differently: Take 21 mg by mouth daily.)      psyllium (METAMUCIL) 3.4 gram packet Take 1 packet by mouth daily as needed (constipation).      pyridoxine, vitamin B6, (B-6) 25 MG tablet Take by mouth.      sertraline (ZOLOFT) 100 MG tablet Take 1 tablet (100 mg total) by mouth daily.      tacrolimus  (PROGRAF ) 0.5 MG capsule Take 3 capsules (1.5 mg total) by mouth two (2) times a day. 540 capsule 3     No current facility-administered medications for this visit.   [2]   Allergies  Allergen Reactions    Avastin [Bevacizumab] Other (See Comments)     Mini stroke    Prednisone Other (See Comments)     Patient was told by her ophthalmologist that the vision loss she had in one of her eyes was likely a result of high dose steroids. She also reports insomnia/jitters when taking it.    Amoxicillin-Pot Clavulanate      Other reaction(s): NAUSEA    Codeine      Other reaction(s): NAUSEA    Penicillins      Other reaction(s): UNKNOWN  * Pt would like to know why she shouldn't take this*    Procaine      Other reaction(s): dizziness    Promethazine      oversedation    Sulfa (Sulfonamide Antibiotics)      Other reaction(s): ITCHING Lidocaine Palpitations    Tamsulosin Itching

## 2024-05-26 ENCOUNTER — Telehealth: Payer: Self-pay

## 2024-05-26 MED FILL — TACROLIMUS 0.5 MG CAPSULE, IMMEDIATE-RELEASE: ORAL | 30 days supply | Qty: 180 | Fill #6

## 2024-05-26 NOTE — Telephone Encounter (Signed)
 Please advise. I looked in your notes I didn't see anything

## 2024-05-26 NOTE — Telephone Encounter (Signed)
 Copied from CRM #[id number]. Topic: Clinical - Medical Advice >> May 26, 2024 11:48 AM Dedra B wrote: Reason for CRM: Pt said Dr. Joshua told her three things that were looking good at her last OV. She said she remembers him saying her BP and kidneys looked good but couldn't remember the third thing. Pls call pt.

## 2024-05-31 ENCOUNTER — Ambulatory Visit (HOSPITAL_COMMUNITY): Admitting: Psychiatry

## 2024-05-31 DIAGNOSIS — F331 Major depressive disorder, recurrent, moderate: Secondary | ICD-10-CM

## 2024-05-31 DIAGNOSIS — F411 Generalized anxiety disorder: Secondary | ICD-10-CM

## 2024-05-31 MED ORDER — SERTRALINE HCL 100 MG PO TABS: 150.0000 mg | ORAL_TABLET | Freq: Every day | ORAL | 0 refills | Status: AC

## 2024-06-21 ENCOUNTER — Ambulatory Visit (HOSPITAL_COMMUNITY): Admitting: Psychiatry

## 2024-06-21 DIAGNOSIS — F411 Generalized anxiety disorder: Secondary | ICD-10-CM

## 2024-06-21 NOTE — Progress Notes (Signed)
 " [facility] [address] [place] [address] [place] [address] Dept: [phone] Dept Fax: [phone]  Psychotherapy Progress Note  Patient ID: Candace Hill, female  DOB: August 25, 1945, 79 y.o.  MRN: [id number]  06/21/2024 Start time: 2 PM End time: 3 PM  Method of Visit: Video: Virtual Visit via Video Encounter:  I connected with Candace Hill on 06/21/2024 at  2:00 PM EST by a video enabled telemedicine application and verified that I am speaking with the correct person using two identifiers.   Location: Patient: home Provider: office   I discussed the limitations of evaluation and management by telemedicine and the availability of in person appointments. The patient expressed understanding and agreed to proceed.  I discussed the assessment and treatment plan with the patient. The patient was provided an opportunity to ask questions and all were answered. The patient agreed with the plan and demonstrated an understanding of the instructions.   The patient was advised to call back or seek an in-person evaluation if the symptoms worsen or if the condition fails to improve as anticipated.  I provided 60 minutes of non-face-to-face time during this encounter.  Present: caregiver  Current Concerns: anxiety about prior significant other  Current Symptoms: Anxiety  Psychiatric Specialty Exam: General Appearance: Casual  Eye Contact:  Good  Speech:  Clear and Coherent and Normal Rate  Volume:  Normal  Mood:  Anxious  Affect:  Congruent  Thought Process:  Coherent, Goal Directed, and Linear  Orientation:  Full (Time, Place, and Person)  Thought Content:  Logical  Suicidal Thoughts:  No  Homicidal Thoughts:  No  Memory:  Remote;   Fair  Judgement:  Fair  Insight:  Fair  Psychomotor Activity:  Normal  Concentration:  Concentration: Good  Recall:  Fair  Fund of Knowledge:Fair  Language: Good  Akathisia:  No   Handed:  Right  AIMS (if indicated):  not done  Assets:  Communication Skills Desire for Improvement Financial Resources/Insurance Housing Intimacy Leisure Time Resilience Social Support Vocational/Educational  ADL's:  Intact  Cognition: WNL  Sleep:  Good     Diagnosis: GAD  Anticipated Frequency of Visits: every other week Anticipated Length of Treatment Episode: 8 months  Short Term Goals/Goals for Treatment Session:    1) decrease anxiety - discuss setting boundaries with the prior significant other and thinking of ways to confront him if he displeases her - understanding what her priorities are with finding a partner 2) resist flight/freeze response 3) identify anxiety inducing thoughts - what bothers her about rejection and being told one thing and the actions are incongruent 4) use relaxation strategies (deep breathing, visualization, cognitive cueing, muscle relaxation)    Progress Towards Goals: Progressing AEB ability to reframe thoughts and set boundaries  Treatment Intervention: Supportive therapy  Medical Necessity: Improved patient condition  Assessment Tools:    02/09/2024   11:38 AM 01/26/2024    9:02 AM 12/30/2023    1:26 PM  Depression screen PHQ 2/9  Decreased Interest 2 2 0  Down, Depressed, Hopeless 2 2 1   PHQ - 2 Score 4 4 1   Altered sleeping 3 0 0  Tired, decreased energy 3 2 1   Change in appetite 2 2 0  Feeling bad or failure about yourself  1 1 0  Trouble concentrating 3 3 3   Moving slowly or fidgety/restless 2 0   Suicidal thoughts 0 0 0  PHQ-9 Score 18  12  5    Difficult  doing work/chores  Somewhat difficult Somewhat difficult     Data saved with a previous flowsheet row definition   Failed to redirect to the Timeline version of the REVFS SmartLink. Flowsheet Row ED from 09/10/2023 in [facility] Emergency Department at [facility] ED from 08/26/2023 in [facility] Emergency Department at [facility] Counselor from  12/30/2022 in [facility] at [facility] RISK CATEGORY No Risk No Risk Low Risk     Patient/Guardian was advised Release of Information must be obtained prior to any record release in order to collaborate their care with an outside provider. Patient/Guardian was advised if they have not already done so to contact the registration department to sign all necessary forms in order for us  to release information regarding their care.   Consent: Patient/Guardian gives verbal consent for treatment and assignment of benefits for services provided during this visit. Patient/Guardian expressed understanding and agreed to proceed.   Plan: f/u in 2 weeks, write down 3 things that bring her joy  Ismael KATHEE Franco, MD 06/21/2024           "

## 2024-06-23 NOTE — Progress Notes (Signed)
 Kristy Hanson has been contacted in regards to their refill of tacrolimus  0.5 MG capsule (PROGRAF ). At this time, they have declined refill due to not having her card payment available . Asked we call back next week. Refill assessment call date has been updated per the patient's request.

## 2024-06-26 ENCOUNTER — Ambulatory Visit (HOSPITAL_COMMUNITY): Admitting: Psychiatry

## 2024-06-29 ENCOUNTER — Telehealth (HOSPITAL_COMMUNITY): Payer: Self-pay | Admitting: *Deleted

## 2024-06-29 NOTE — Telephone Encounter (Signed)
 Fax request from [facility] pharmacy for sertraline  to be renewed. Reviewed chart and last rx sent to this pharmacy on 05/26/24 for three months. She has a future appt on 07/05/24. I tried to contact pharmacy to confirm this put after a considerable hold twice I gave up. She should have meds till till March 2026 per chart and her appt is in 1 week.

## 2024-07-05 ENCOUNTER — Ambulatory Visit (HOSPITAL_COMMUNITY): Admitting: Psychiatry

## 2024-07-05 ENCOUNTER — Other Ambulatory Visit: Payer: Self-pay | Admitting: *Deleted

## 2024-07-05 DIAGNOSIS — F331 Major depressive disorder, recurrent, moderate: Secondary | ICD-10-CM | POA: Diagnosis not present

## 2024-07-05 NOTE — Progress Notes (Signed)
 " [facility] [address] [place] [address] [place] [address] Dept: [phone] Dept Fax: [phone]  Psychotherapy Progress Note  Patient ID: Amir Glaus, female  DOB: 27-May-1946, 79 y.o.  MRN: [id number]  07/05/2024 Start time: 2 PM End time: 3 PM  Method of Visit: Video: Virtual Visit via Video Encounter:  I connected with [name] on 07/05/24 at  2:00 PM EST by a video enabled telemedicine application and verified that I am speaking with the correct person using two identifiers.  Location: Patient: home Provider: home office   I discussed the limitations of evaluation and management by telemedicine and the availability of in person appointments. The patient expressed understanding and agreed to proceed.  I discussed the assessment and treatment plan with the patient. The patient was provided an opportunity to ask questions and all were answered. The patient agreed with the plan and demonstrated an understanding of the instructions.   The patient was advised to call back or seek an in-person evaluation if the symptoms worsen or if the condition fails to improve as anticipated.   Present: patient  Current Concerns: sadness about cutting ties with ex fiance  Current Symptoms: Depressed Mood  Psychiatric Specialty Exam: General Appearance: appears at stated age  Behavior: pleasant and cooperative   Psychomotor Activity: no psychomotor agitation or retardation noted   Eye Contact: fair  Speech: normal amount, volume and fluency    Mood: sad  Affect: congruent  Thought Process: linear, goal directed, no circumstantial or tangential thought process noted, no racing thoughts or flight of ideas  Descriptions of Associations: intact   Thought Content Hallucinations: denies AH, VH , does not appear responding to stimuli  Delusions: no paranoia, delusions of control, grandeur, ideas of  reference, thought broadcasting, and magical thinking  Suicidal Thoughts: denies SI, intention, plan  Homicidal Thoughts: denies HI, intention, plan   Alertness/Orientation: alert and fully oriented   Insight: fair Judgment: limited  Memory: intact   Executive Functions  Concentration: intact  Attention Span: fair  Recall: intact  Fund of Knowledge: fair   Diagnosis: GAD  Anticipated Frequency of Visits: every other week Anticipated Length of Treatment Episode: ~8 months  Short Term Goals/Goals for Treatment Session:   - decrease sadness  - discuss setting boundaries with the prior significant other and thinking of ways to confront him if he displeases her- she plans on cutting ties with him since he's made the decision of not pursuing her - had lunch with her ex fiance from 50 years ago and the encounter was pleasant but called pt back and stated that he could not proceed with the relationship due to his stressors and this caught pt off guard - identify emotion   - pt feels sad about the situation but she is appreciative that he clearly communicated his decision - constructing healthier behaviors to improve outcome  - did not complete her goal of thinking of three things that bring her joy because she forgot  - she is motivated to cope with the pain: finding projects to clean up the clutter in her house - using positive affirmations to reinforcement positive self thinking   - worked on positive affirmation que card    Progress Towards Goals: not Progressing as she did not completed the goal from prior appointment  Treatment Intervention: Supportive therapy  Medical Necessity: Improved patient condition  Assessment Tools:    02/09/2024   11:38 AM 01/26/2024    9:02  AM 12/30/2023    1:26 PM  Depression screen PHQ 2/9  Decreased Interest 2 2 0  Down, Depressed, Hopeless 2 2 1   PHQ - 2 Score 4 4 1   Altered sleeping 3 0 0  Tired, decreased energy 3 2 1   Change in  appetite 2 2 0  Feeling bad or failure about yourself  1 1 0  Trouble concentrating 3 3 3   Moving slowly or fidgety/restless 2 0   Suicidal thoughts 0 0 0  PHQ-9 Score 18  12  5    Difficult doing work/chores  Somewhat difficult Somewhat difficult     Data saved with a previous flowsheet row definition   Failed to redirect to the Timeline version of the REVFS SmartLink. Flowsheet Row ED from 09/10/2023 in [facility] Emergency Department at [facility] ED from 08/26/2023 in [facility] Emergency Department at [facility] Counselor from 12/30/2022 in [facility] Behavioral Health at [facility] RISK CATEGORY No Risk No Risk Low Risk    Collaboration of Care: Other Dr. Ezzard  Patient/Guardian was advised Release of Information must be obtained prior to any record release in order to collaborate their care with an outside provider. Patient/Guardian was advised if they have not already done so to contact the registration department to sign all necessary forms in order for us  to release information regarding their care.   Consent: Patient/Guardian gives verbal consent for treatment and assignment of benefits for services provided during this visit. Patient/Guardian expressed understanding and agreed to proceed.   Plan: f/u in 2 weeks, finalize que card with positive affirmations  Ismael KATHEE Franco, MD 07/05/2024           "

## 2024-07-06 NOTE — Patient Instructions (Signed)
 [name] - I am sorry I was unable to reach you today for our scheduled appointment.  I have rescheduled appointment for 08/15/24 @ 11 am.   work with Joshua Debby CROME, MD and am calling to support your healthcare needs. Please contact me at [phone] at your earliest convenience. I look forward to speaking with you soon.   Thank you,  Ules Marsala, RN, BSN, ACM RN Care Manager [employer] [phone]

## 2024-07-10 ENCOUNTER — Encounter (HOSPITAL_COMMUNITY): Payer: Self-pay

## 2024-07-10 ENCOUNTER — Ambulatory Visit (HOSPITAL_COMMUNITY): Admitting: Psychiatry

## 2024-07-13 NOTE — Progress Notes (Signed)
 [facility] Refill Coordination Note    Specialty Medication(s) to be Shipped:   Transplant: tacrolimus  0.5 mg    Other medication(s) to be shipped: No additional medications requested for fill at this time    Specialty Medications not needed at this time: N/A     Kristy Hanson, DOB: [birth year]  Phone: [phone] (home)       All above HIPAA information was verified with patient.     Was a nurse, learning disability used for this call? No    Completed refill call assessment today to schedule patient's medication shipment from the [facility]  [phone]).  All relevant notes have been reviewed.     Specialty medication(s) and dose(s) confirmed: Regimen is correct and unchanged.   Changes to medications: Kristy Hanson reports no changes at this time.  Changes to insurance: No  New side effects reported not previously addressed with a pharmacist or physician: None reported  Questions for the pharmacist: No    Confirmed patient received a Conservation Officer, Historic Buildings and a Surveyor, Mining with first shipment. The patient will receive a drug information handout for each medication shipped and additional FDA Medication Guides as required.       DISEASE/MEDICATION-SPECIFIC INFORMATION        N/A    SPECIALTY MEDICATION ADHERENCE     Medication Adherence    Patient reported X missed doses in the last month: 0  Specialty Medication: tacrolimus  0.5 MG capsule (PROGRAF )  Patient is on additional specialty medications: No              Were doses missed due to medication being on hold? No    tacrolimus  0.5 MG capsule (PROGRAF ) 5 days of medicine on hand       Specialty medication is an injection or given on a cycle: No    REFERRAL TO PHARMACIST     Referral to the pharmacist: Not needed      [facility]     Shipping address confirmed in Epic.     Cost and Payment: Patient has a copay of $28.72. They are aware and have authorized the pharmacy to charge the credit card on file.    Delivery Scheduled: Yes, Expected medication delivery date: 07/18/2024.     Medication will be delivered via UPS to the prescription address in Epic WAM.    [facility]  Specialty Technician

## 2024-07-17 MED FILL — TACROLIMUS 0.5 MG CAPSULE, IMMEDIATE-RELEASE: ORAL | 30 days supply | Qty: 180 | Fill #7

## 2024-07-19 ENCOUNTER — Ambulatory Visit (HOSPITAL_COMMUNITY): Admitting: Psychiatry

## 2024-07-19 DIAGNOSIS — F331 Major depressive disorder, recurrent, moderate: Secondary | ICD-10-CM | POA: Diagnosis not present

## 2024-07-19 DIAGNOSIS — F411 Generalized anxiety disorder: Secondary | ICD-10-CM | POA: Diagnosis not present

## 2024-07-19 NOTE — Progress Notes (Signed)
 " [facility] [address] [place] [address] Dept: [phone] Dept Fax: [phone]  Psychotherapy Progress Note  Patient ID: Candace Hill, female  DOB: 1945-07-02, 79 y.o.  MRN: [id number]  07/19/2024 Start time: 2 PM End time: 3 PM  Method of Visit: Video: Virtual Visit via Video Encounter:  I connected with Candace Hill on 07/19/24 at  2:00 PM EST by a video enabled telemedicine application and verified that I am speaking with the correct person using two identifiers.  Location: Patient: home Provider: home office   I discussed the limitations of evaluation and management by telemedicine and the availability of in person appointments. The patient expressed understanding and agreed to proceed.  I discussed the assessment and treatment plan with the patient. The patient was provided an opportunity to ask questions and all were answered. The patient agreed with the plan and demonstrated an understanding of the instructions.   The patient was advised to call back or seek an in-person evaluation if the symptoms worsen or if the condition fails to improve as anticipated.   Present: patient  Current Concerns: frustration with daughter  Current Symptoms: irritability  Psychiatric Specialty Exam:  General Appearance: appears at stated age  Behavior: pleasant and cooperative   Psychomotor Activity: no psychomotor agitation or retardation noted   Eye Contact: fair  Speech: normal amount, volume and fluency    Mood: okay , irritable when taking about her daughter Affect: congruent  Thought Process: linear, goal directed, no circumstantial or tangential thought process noted, no racing thoughts or flight of ideas  Descriptions of Associations: intact   Thought Content Hallucinations: denies AH, VH , does not appear responding to stimuli  Delusions: no paranoia, delusions of control,  grandeur, ideas of reference, thought broadcasting, and magical thinking  Suicidal Thoughts: denies SI, intention, plan  Homicidal Thoughts: denies HI, intention, plan   Alertness/Orientation: alert and fully oriented   Insight: fair Judgment: fair  Memory: intact   Executive Functions  Concentration: intact  Attention Span: fair  Recall: intact  Fund of Knowledge: fair   Diagnosis: GAD, MDD  Anticipated Frequency of Visits: every other week Anticipated Length of Treatment Episode: ~8 months  Short Term Goals/Goals for Treatment Session:   - improved mood  - reframing thoughts about the prior love interest and gained insight on her self-worth  - recently found out that she may be able to drive but she gave her car to her granddaughter  - analyzing evidence against daughter purposely lying to her about pt shouldn't be able to drive -  increase socialization  - goal is to go to church and caregiver is fine with taking her for the first time this Sunday  - feels that church is a space of joy for her in the past   Progress Towards Goals: slowly Progressing as she is able to reframe thoughts about previously romantic interest and thus having an improved mood  Treatment Intervention: Supportive therapy  Medical Necessity: Improved patient condition  Assessment Tools:    02/09/2024   11:38 AM 01/26/2024    9:02 AM 12/30/2023    1:26 PM  Depression screen PHQ 2/9  Decreased Interest 2 2 0  Down, Depressed, Hopeless 2 2 1   PHQ - 2 Score 4 4 1   Altered sleeping 3 0 0  Tired, decreased energy 3 2 1   Change in appetite 2 2 0  Feeling bad or failure about yourself  1  1 0  Trouble concentrating 3 3 3   Moving slowly or fidgety/restless 2 0   Suicidal thoughts 0 0 0  PHQ-9 Score 18  12  5    Difficult doing work/chores  Somewhat difficult Somewhat difficult     Data saved with a previous flowsheet row definition   Failed to redirect to the Timeline version of the REVFS  SmartLink. Flowsheet Row ED from 09/10/2023 in [facility] Department at [facility] ED from 08/26/2023 in [facility] Department at [facility] Counselor from 12/30/2022 in [facility] at [facility] RISK CATEGORY No Risk No Risk Low Risk    Collaboration of Care: Other Dr. Ezzard  Patient/Guardian was advised Release of Information must be obtained prior to any record release in order to collaborate their care with an outside provider. Patient/Guardian was advised if they have not already done so to contact the registration department to sign all necessary forms in order for us  to release information regarding their care.   Consent: Patient/Guardian gives verbal consent for treatment and assignment of benefits for services provided during this visit. Patient/Guardian expressed understanding and agreed to proceed.   Plan: f/u in 2 weeks, goal is to finish positive affirmation card and go to church at least once  Ismael KATHEE Franco, MD 07/19/2024    "

## 2024-07-24 ENCOUNTER — Ambulatory Visit (HOSPITAL_COMMUNITY): Admitting: Psychiatry

## 2024-08-02 ENCOUNTER — Ambulatory Visit (HOSPITAL_COMMUNITY): Admitting: Psychiatry

## 2024-08-15 ENCOUNTER — Telehealth: Admitting: *Deleted

## 2024-08-23 ENCOUNTER — Ambulatory Visit (HOSPITAL_COMMUNITY): Admitting: Psychiatry

## 2025-01-04 ENCOUNTER — Ambulatory Visit

## 2025-01-29 ENCOUNTER — Ambulatory Visit: Admitting: Physician Assistant
